# Patient Record
Sex: Male | Born: 1960 | Race: Black or African American | Hispanic: No | Marital: Married | Smoking: Never smoker
Health system: Southern US, Community
[De-identification: ages and names within clinical notes are randomized; demographics above are authoritative.]

## PROBLEM LIST (undated history)

## (undated) DIAGNOSIS — I10 Essential (primary) hypertension: Secondary | ICD-10-CM

## (undated) DIAGNOSIS — I8290 Acute embolism and thrombosis of unspecified vein: Secondary | ICD-10-CM

## (undated) DIAGNOSIS — F329 Major depressive disorder, single episode, unspecified: Secondary | ICD-10-CM

## (undated) DIAGNOSIS — R161 Splenomegaly, not elsewhere classified: Secondary | ICD-10-CM

## (undated) DIAGNOSIS — I428 Other cardiomyopathies: Secondary | ICD-10-CM

## (undated) DIAGNOSIS — C801 Malignant (primary) neoplasm, unspecified: Secondary | ICD-10-CM

## (undated) DIAGNOSIS — I472 Ventricular tachycardia, unspecified: Secondary | ICD-10-CM

## (undated) DIAGNOSIS — I959 Hypotension, unspecified: Secondary | ICD-10-CM

## (undated) DIAGNOSIS — K561 Intussusception: Secondary | ICD-10-CM

## (undated) DIAGNOSIS — I4729 Other ventricular tachycardia: Secondary | ICD-10-CM

## (undated) DIAGNOSIS — I6350 Cerebral infarction due to unspecified occlusion or stenosis of unspecified cerebral artery: Secondary | ICD-10-CM

## (undated) DIAGNOSIS — I513 Intracardiac thrombosis, not elsewhere classified: Secondary | ICD-10-CM

## (undated) DIAGNOSIS — Z9114 Patient's other noncompliance with medication regimen: Secondary | ICD-10-CM

## (undated) DIAGNOSIS — N289 Disorder of kidney and ureter, unspecified: Secondary | ICD-10-CM

## (undated) DIAGNOSIS — I509 Heart failure, unspecified: Secondary | ICD-10-CM

## (undated) DIAGNOSIS — E785 Hyperlipidemia, unspecified: Secondary | ICD-10-CM

## (undated) DIAGNOSIS — Z95810 Presence of automatic (implantable) cardiac defibrillator: Secondary | ICD-10-CM

## (undated) DIAGNOSIS — Z91148 Patient's other noncompliance with medication regimen for other reason: Secondary | ICD-10-CM

## (undated) DIAGNOSIS — F32A Depression, unspecified: Secondary | ICD-10-CM

## (undated) DIAGNOSIS — K56609 Unspecified intestinal obstruction, unspecified as to partial versus complete obstruction: Secondary | ICD-10-CM

## (undated) DIAGNOSIS — Z950 Presence of cardiac pacemaker: Secondary | ICD-10-CM

## (undated) HISTORY — PX: VASECTOMY: SHX75

## (undated) HISTORY — DX: Ventricular tachycardia: I47.2

## (undated) HISTORY — DX: Essential (primary) hypertension: I10

## (undated) HISTORY — DX: Other ventricular tachycardia: I47.29

## (undated) HISTORY — PX: PACEMAKER INSERTION: SHX728

## (undated) HISTORY — PX: BOWEL RESECTION: SHX1257

## (undated) HISTORY — DX: Cerebral infarction due to unspecified occlusion or stenosis of unspecified cerebral artery: I63.50

## (undated) HISTORY — DX: Ventricular tachycardia, unspecified: I47.20

## (undated) HISTORY — PX: CARDIAC DEFIBRILLATOR PLACEMENT: SHX171

## (undated) HISTORY — PX: INSERT / REPLACE / REMOVE PACEMAKER: SUR710

## (undated) HISTORY — DX: Hyperlipidemia, unspecified: E78.5

---

## 1999-07-12 DIAGNOSIS — K56609 Unspecified intestinal obstruction, unspecified as to partial versus complete obstruction: Secondary | ICD-10-CM

## 1999-07-12 HISTORY — DX: Unspecified intestinal obstruction, unspecified as to partial versus complete obstruction: K56.609

## 2006-07-11 DIAGNOSIS — K561 Intussusception: Secondary | ICD-10-CM

## 2006-07-11 HISTORY — DX: Intussusception: K56.1

## 2007-03-08 ENCOUNTER — Inpatient Hospital Stay (HOSPITAL_COMMUNITY): Admission: EM | Admit: 2007-03-08 | Discharge: 2007-03-10 | Payer: Self-pay | Admitting: Emergency Medicine

## 2007-03-12 ENCOUNTER — Emergency Department (HOSPITAL_COMMUNITY): Admission: EM | Admit: 2007-03-12 | Discharge: 2007-03-12 | Payer: Self-pay | Admitting: Emergency Medicine

## 2007-03-26 ENCOUNTER — Encounter: Admission: RE | Admit: 2007-03-26 | Discharge: 2007-03-26 | Payer: Self-pay | Admitting: Internal Medicine

## 2007-04-12 ENCOUNTER — Encounter: Admission: RE | Admit: 2007-04-12 | Discharge: 2007-04-12 | Payer: Self-pay | Admitting: Gastroenterology

## 2007-04-12 ENCOUNTER — Inpatient Hospital Stay (HOSPITAL_COMMUNITY): Admission: EM | Admit: 2007-04-12 | Discharge: 2007-04-17 | Payer: Self-pay | Admitting: *Deleted

## 2007-04-17 ENCOUNTER — Ambulatory Visit: Payer: Self-pay | Admitting: Critical Care Medicine

## 2007-04-17 ENCOUNTER — Inpatient Hospital Stay (HOSPITAL_COMMUNITY): Admission: EM | Admit: 2007-04-17 | Discharge: 2007-04-24 | Payer: Self-pay | Admitting: Emergency Medicine

## 2007-04-17 ENCOUNTER — Encounter: Payer: Self-pay | Admitting: Emergency Medicine

## 2007-04-18 ENCOUNTER — Encounter (INDEPENDENT_AMBULATORY_CARE_PROVIDER_SITE_OTHER): Payer: Self-pay | Admitting: Pediatrics

## 2007-04-23 ENCOUNTER — Ambulatory Visit: Payer: Self-pay | Admitting: Physical Medicine & Rehabilitation

## 2007-04-24 ENCOUNTER — Ambulatory Visit: Payer: Self-pay | Admitting: Physical Medicine & Rehabilitation

## 2007-04-24 ENCOUNTER — Inpatient Hospital Stay (HOSPITAL_COMMUNITY)
Admission: RE | Admit: 2007-04-24 | Discharge: 2007-05-15 | Payer: Self-pay | Admitting: Physical Medicine & Rehabilitation

## 2007-06-14 ENCOUNTER — Ambulatory Visit: Payer: Self-pay | Admitting: Physical Medicine & Rehabilitation

## 2007-06-14 ENCOUNTER — Encounter
Admission: RE | Admit: 2007-06-14 | Discharge: 2007-06-15 | Payer: Self-pay | Admitting: Physical Medicine & Rehabilitation

## 2007-06-25 ENCOUNTER — Encounter
Admission: RE | Admit: 2007-06-25 | Discharge: 2007-07-11 | Payer: Self-pay | Admitting: Physical Medicine & Rehabilitation

## 2007-07-12 DIAGNOSIS — I513 Intracardiac thrombosis, not elsewhere classified: Secondary | ICD-10-CM

## 2007-07-12 DIAGNOSIS — R161 Splenomegaly, not elsewhere classified: Secondary | ICD-10-CM

## 2007-07-12 HISTORY — DX: Splenomegaly, not elsewhere classified: R16.1

## 2007-07-12 HISTORY — DX: Intracardiac thrombosis, not elsewhere classified: I51.3

## 2007-07-16 ENCOUNTER — Encounter
Admission: RE | Admit: 2007-07-16 | Discharge: 2007-10-14 | Payer: Self-pay | Admitting: Physical Medicine & Rehabilitation

## 2007-08-15 ENCOUNTER — Encounter: Admission: RE | Admit: 2007-08-15 | Discharge: 2007-08-15 | Payer: Self-pay | Admitting: Internal Medicine

## 2008-02-25 ENCOUNTER — Inpatient Hospital Stay (HOSPITAL_COMMUNITY): Admission: EM | Admit: 2008-02-25 | Discharge: 2008-02-28 | Payer: Self-pay | Admitting: Emergency Medicine

## 2008-02-25 ENCOUNTER — Ambulatory Visit: Payer: Self-pay | Admitting: Internal Medicine

## 2008-02-26 ENCOUNTER — Encounter: Payer: Self-pay | Admitting: Internal Medicine

## 2008-03-07 ENCOUNTER — Ambulatory Visit: Payer: Self-pay | Admitting: Internal Medicine

## 2008-09-23 ENCOUNTER — Emergency Department (HOSPITAL_COMMUNITY): Admission: EM | Admit: 2008-09-23 | Discharge: 2008-09-23 | Payer: Self-pay | Admitting: Emergency Medicine

## 2008-10-21 DIAGNOSIS — I429 Cardiomyopathy, unspecified: Secondary | ICD-10-CM | POA: Insufficient documentation

## 2008-10-21 DIAGNOSIS — I6350 Cerebral infarction due to unspecified occlusion or stenosis of unspecified cerebral artery: Secondary | ICD-10-CM | POA: Insufficient documentation

## 2008-10-21 DIAGNOSIS — Z95810 Presence of automatic (implantable) cardiac defibrillator: Secondary | ICD-10-CM

## 2008-10-21 DIAGNOSIS — I10 Essential (primary) hypertension: Secondary | ICD-10-CM | POA: Insufficient documentation

## 2008-10-21 DIAGNOSIS — E785 Hyperlipidemia, unspecified: Secondary | ICD-10-CM

## 2008-10-23 ENCOUNTER — Ambulatory Visit: Payer: Self-pay | Admitting: Internal Medicine

## 2008-10-23 ENCOUNTER — Encounter: Payer: Self-pay | Admitting: Internal Medicine

## 2008-12-01 ENCOUNTER — Telehealth: Payer: Self-pay | Admitting: Internal Medicine

## 2008-12-24 ENCOUNTER — Emergency Department (HOSPITAL_COMMUNITY): Admission: EM | Admit: 2008-12-24 | Discharge: 2008-12-24 | Payer: Self-pay | Admitting: Emergency Medicine

## 2009-01-15 ENCOUNTER — Telehealth: Payer: Self-pay | Admitting: Internal Medicine

## 2009-01-20 ENCOUNTER — Ambulatory Visit: Payer: Self-pay | Admitting: Internal Medicine

## 2009-01-20 DIAGNOSIS — I472 Ventricular tachycardia: Secondary | ICD-10-CM

## 2009-02-02 ENCOUNTER — Ambulatory Visit: Payer: Self-pay | Admitting: Internal Medicine

## 2009-02-06 ENCOUNTER — Telehealth: Payer: Self-pay | Admitting: Internal Medicine

## 2009-02-06 LAB — CONVERTED CEMR LAB
Basophils Absolute: 0 10*3/uL (ref 0.0–0.1)
Basophils Relative: 0.7 % (ref 0.0–3.0)
Chloride: 100 meq/L (ref 96–112)
GFR calc non Af Amer: 92.68 mL/min (ref >60)
INR: 3.7 — ABNORMAL HIGH (ref 0.8–1.0)
Lymphs Abs: 1.9 10*3/uL (ref 0.7–4.0)
Monocytes Relative: 6.3 % (ref 3.0–12.0)
Potassium: 4 meq/L (ref 3.5–5.1)
Prothrombin Time: 37.2 s — ABNORMAL HIGH (ref 10.9–13.3)
RBC: 3.77 M/uL — ABNORMAL LOW (ref 4.22–5.81)
WBC: 5.4 10*3/uL (ref 4.5–10.5)
aPTT: 50.9 s — ABNORMAL HIGH (ref 21.7–28.8)

## 2009-02-09 ENCOUNTER — Telehealth: Payer: Self-pay | Admitting: Internal Medicine

## 2009-02-09 ENCOUNTER — Ambulatory Visit: Payer: Self-pay | Admitting: Internal Medicine

## 2009-02-09 ENCOUNTER — Ambulatory Visit (HOSPITAL_COMMUNITY): Admission: RE | Admit: 2009-02-09 | Discharge: 2009-02-09 | Payer: Self-pay | Admitting: Internal Medicine

## 2009-02-10 ENCOUNTER — Encounter: Payer: Self-pay | Admitting: Internal Medicine

## 2009-02-26 ENCOUNTER — Ambulatory Visit: Payer: Self-pay

## 2009-02-26 ENCOUNTER — Encounter: Payer: Self-pay | Admitting: Internal Medicine

## 2010-05-10 ENCOUNTER — Ambulatory Visit: Payer: Self-pay | Admitting: Internal Medicine

## 2010-07-23 ENCOUNTER — Emergency Department (HOSPITAL_COMMUNITY)
Admission: EM | Admit: 2010-07-23 | Discharge: 2010-07-24 | Payer: Self-pay | Source: Home / Self Care | Admitting: Emergency Medicine

## 2010-07-26 LAB — CBC
HCT: 35.1 % — ABNORMAL LOW (ref 39.0–52.0)
Hemoglobin: 11.9 g/dL — ABNORMAL LOW (ref 13.0–17.0)
MCH: 29.8 pg (ref 26.0–34.0)
MCHC: 33.9 g/dL (ref 30.0–36.0)
MCV: 88 fL (ref 78.0–100.0)
Platelets: 245 10*3/uL (ref 150–400)
RBC: 3.99 MIL/uL — ABNORMAL LOW (ref 4.22–5.81)
RDW: 12.6 % (ref 11.5–15.5)
WBC: 5 10*3/uL (ref 4.0–10.5)

## 2010-07-26 LAB — POCT I-STAT, CHEM 8
BUN: 14 mg/dL (ref 6–23)
Calcium, Ion: 1.19 mmol/L (ref 1.12–1.32)
Chloride: 102 mEq/L (ref 96–112)
Creatinine, Ser: 1 mg/dL (ref 0.4–1.5)
Glucose, Bld: 102 mg/dL — ABNORMAL HIGH (ref 70–99)
HCT: 39 % (ref 39.0–52.0)
Hemoglobin: 13.3 g/dL (ref 13.0–17.0)
Potassium: 2.9 mEq/L — ABNORMAL LOW (ref 3.5–5.1)
Sodium: 141 mEq/L (ref 135–145)
TCO2: 29 mmol/L (ref 0–100)

## 2010-07-26 LAB — GLUCOSE, CAPILLARY
Glucose-Capillary: 153 mg/dL — ABNORMAL HIGH (ref 70–99)
Glucose-Capillary: 194 mg/dL — ABNORMAL HIGH (ref 70–99)
Glucose-Capillary: 198 mg/dL — ABNORMAL HIGH (ref 70–99)
Glucose-Capillary: 95 mg/dL (ref 70–99)

## 2010-07-26 LAB — DIFFERENTIAL
Basophils Absolute: 0.1 10*3/uL (ref 0.0–0.1)
Basophils Relative: 1 % (ref 0–1)
Eosinophils Absolute: 0.4 10*3/uL (ref 0.0–0.7)
Eosinophils Relative: 8 % — ABNORMAL HIGH (ref 0–5)
Lymphocytes Relative: 39 % (ref 12–46)
Lymphs Abs: 2 10*3/uL (ref 0.7–4.0)
Monocytes Absolute: 0.6 10*3/uL (ref 0.1–1.0)
Monocytes Relative: 11 % (ref 3–12)
Neutro Abs: 2 10*3/uL (ref 1.7–7.7)
Neutrophils Relative %: 40 % — ABNORMAL LOW (ref 43–77)

## 2010-07-26 LAB — POCT CARDIAC MARKERS
CKMB, poc: <1 ng/mL — ABNORMAL LOW (ref 1.0–8.0)
Myoglobin, poc: 69.3 ng/mL (ref 12–200)
Troponin i, poc: <0.05 ng/mL (ref 0.00–0.09)

## 2010-07-26 LAB — DIGOXIN LEVEL: Digoxin Level: <0.2 ng/mL — ABNORMAL LOW (ref 0.8–2.0)

## 2010-08-01 ENCOUNTER — Encounter: Payer: Self-pay | Admitting: Physical Medicine & Rehabilitation

## 2010-08-10 NOTE — Assessment & Plan Note (Signed)
Summary: ROV-MJ   Visit Type:  Follow-up Primary Provider:  Rubbie Tran   History of Present Illness: Andre Tran returns today for followup.  He is a 50 yo man with a h/o VF arrest, s/p ICD implant who returns today for ICD followup.  He denies c/p or sob.  He has had no recent ICD therapies.  His records suggest he sustained a stroke 2 years ago. He still has some trouble with word finding but otherwise has had no complaints. No ICD shocks.  Current Medications (verified): 1)  Aspirin 81 Mg Tbec (Aspirin) .... Take One Tablet By Mouth Daily 2)  Digoxin 0.125 Mg Tabs (Digoxin) .... One By Mouth Daily 3)  Furosemide 40 Mg Tabs (Furosemide) .... Take One Tablet By Mouth Daily. 4)  Lantus For Opticlik 100 Unit/ml Soln (Insulin Glargine) .... As Directed 5)  Maox 420 Mg Tabs (Magnesium Oxide) .Marland Kitchen.. 1 By Mouth Once Daily 6)  Gnp Omeprazole 20 Mg Tbec (Omeprazole) .Marland Kitchen.. 1 By Mouth Once Daily 7)  Pravastatin Sodium 40 Mg Tabs (Pravastatin Sodium) .... Take One Tablet By Mouth Daily At Bedtime 8)  Sotalol Hcl 80 Mg Tabs (Sotalol Hcl) .... 2 By Mouth Two Times A Day 9)  Spironolactone 25 Mg Tabs (Spironolactone) .... Take One Tablet By Mouth Daily 10)  Warfarin Sodium 5 Mg Tabs (Warfarin Sodium) .... Use As Directed By Anticoagulation Clinic  Allergies (verified): No Known Drug Allergies  Past History:  Past Medical History: Last updated: 10/21/2008 HYPERTENSION, UNSPECIFIED (ICD-401.9) HYPERLIPIDEMIA-MIXED (ICD-272.4) CVA (ICD-434.91) CARDIOMYOPATHY, SECONDARY (ICD-425.9) ICD - IN SITU (ICD-V45.02)    Past Surgical History: Last updated: 05/28/2009 [employer] Jude Fortify DR single chamber cardioverter defibrillator.02/09/2009.Doylene Canning. Ladona Ridgel, MD ICD - 2005 Guidant  Review of Systems  The patient denies chest pain, syncope, dyspnea on exertion, and peripheral edema.    Vital Signs:  Patient profile:   50 year old male Height:      67 inches Weight:      175 pounds BMI:      27.51 Pulse rate:   72 / minute BP sitting:   110 / 72  (right arm)  Vitals Entered By: Laurance Flatten CMA (May 10, 2010 12:04 PM)  Physical Exam  General:  Well developed, well nourished, in no acute distress. Head:  normocephalic and atraumatic Eyes:  PERRLA/EOM intact; conjunctiva and lids normal. Mouth:  Teeth, gums and palate normal. Oral mucosa normal. Neck:  Neck supple, no JVD. No masses, thyromegaly or abnormal cervical nodes. Chest Wall:  Well healed ICD incision.  Gynecomastia is present bilaterally. Lungs:  Clear bilaterally.  No wheezes, rales, or rhonchi.   Heart:  RRR with normal S1 and S2.  PMI is not enlarged or laterally displaced.  No murmurs. Abdomen:  Bowel sounds positive; abdomen soft and non-tender without masses, organomegaly, or hernias noted. No hepatosplenomegaly. Msk:  Back normal, normal gait. Muscle strength and tone normal. Pulses:  pulses normal in all 4 extremities Extremities:  No clubbing or cyanosis. Neurologic:  His speech is appropriate but slow.  He is oriented to person, place, and date.    ICD Specifications Following MD:  Lewayne Bunting, MD     ICD Vendor:  [facility] Jude     ICD Model Number:  [id number]     ICD Serial Number:  [id number] ICD DOI:  02/09/2009     ICD Implanting MD:  Lewayne Bunting, MD  Lead 1:    Location: RA     DOI: 08/27/2003  Model #: [id number]     Serial #: [id number]     Status: active Lead 2:    Location: RV     DOI: 08/27/2003     Model #: [id number]     Serial #: [id number]     Status: active  Indications::  MONOMORPHIC VT  Explantation Comments: 02/09/2009 [employer] [id number] explanted  ICD Follow Up Remote Check?  No Charge Time:  9.3 seconds     Battery Est. Longevity:  7.4 years Underlying rhythm:  SR ICD Dependent:  No       ICD Device Measurements Atrium:  Amplitude: 3.0 mV, Impedance: 390 ohms, Threshold: 1.0 V at 0.4 msec Right Ventricle:  Amplitude: 8.8 mV, Impedance: 330 ohms, Threshold: 1.25 V at 0.4 msec Shock  Impedance: 44 ohms   Episodes MS Episodes:  1     Percent Mode Switch:  <1%     Coumadin:  Yes Shock:  0     ATP:  0     Nonsustained:  0     Atrial Pacing:  <1%     Ventricular Pacing:  <1%  Brady Parameters Mode DDD     Lower Rate Limit:  50     Upper Rate Limit 130 PAV 250     Sensed AV Delay:  250  Tachy Zones VF:  214     VT:  193     Next Cardiology Appt Due:  07/11/2010 Tech Comments:  Lead impedance alert values reprogrammed.  Device function normal.  ROV 3 months clinic. Altha Harm, LPN  May 10, 2010 12:26 PM  MD Comments:  Agree with above.  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  Will recheck in several months.  He will continue his meds as below. The following medications were removed from the medication list:    Lisinopril 10 Mg Tabs (Lisinopril) .Marland Kitchen... Take half  tablet by mouth daily His updated medication list for this problem includes:    Aspirin 81 Mg Tbec (Aspirin) .Marland Kitchen... Take one tablet by mouth daily    Sotalol Hcl 80 Mg Tabs (Sotalol hcl) .Marland Kitchen... 2 by mouth two times a day    Warfarin Sodium 5 Mg Tabs (Warfarin sodium) ..... Use as directed by anticoagulation clinic  Problem # 2:  HYPERTENSION, UNSPECIFIED (ICD-401.9) His pressure is well controlled today.  I have asked him to maintain a low sodium diet. The following medications were removed from the medication list:    Lisinopril 10 Mg Tabs (Lisinopril) .Marland Kitchen... Take half  tablet by mouth daily His updated medication list for this problem includes:    Aspirin 81 Mg Tbec (Aspirin) .Marland Kitchen... Take one tablet by mouth daily    Furosemide 40 Mg Tabs (Furosemide) .Marland Kitchen... Take one tablet by mouth daily.    Sotalol Hcl 80 Mg Tabs (Sotalol hcl) .Marland Kitchen... 2 by mouth two times a day    Spironolactone 25 Mg Tabs (Spironolactone) .Marland Kitchen... Take one tablet by mouth daily  Problem # 3:  ICD - IN SITU (ICD-V45.02) His device is working normally.   Will recheck in several months.  Patient Instructions: 1)  Your physician recommends that you schedule a follow-up appointment in: 3 months with Dr Ladona Ridgel 2)  Your physician recommends that you continue on your current medications as directed. Please refer to the Current Medication list given to you today.

## 2010-08-10 NOTE — Cardiovascular Report (Signed)
Summary: Office Visit   Office Visit   Imported By: Roderic Ovens 05/14/2010 09:37:23  _____________________________________________________________________  External Attachment:    Type:   Image     Comment:   External Document

## 2010-08-16 ENCOUNTER — Encounter: Payer: Self-pay | Admitting: Internal Medicine

## 2010-08-16 ENCOUNTER — Encounter (INDEPENDENT_AMBULATORY_CARE_PROVIDER_SITE_OTHER): Payer: Medicare Other

## 2010-08-16 DIAGNOSIS — I472 Ventricular tachycardia: Secondary | ICD-10-CM

## 2010-08-26 NOTE — Cardiovascular Report (Signed)
Summary: Office Visit   Office Visit   Imported By: Roderic Ovens 08/19/2010 16:12:33  _____________________________________________________________________  External Attachment:    Type:   Image     Comment:   External Document

## 2010-09-01 NOTE — Assessment & Plan Note (Signed)
Summary: 3 [email or web address]   Current Medications (verified): 1)  Aspirin 81 Mg Tbec (Aspirin) .... Take One Tablet By Mouth Daily 2)  Digoxin 0.125 Mg Tabs (Digoxin) .... One By Mouth Daily 3)  Furosemide 40 Mg Tabs (Furosemide) .... Take One Tablet By Mouth Daily. 4)  Lantus For Opticlik 100 Unit/ml Soln (Insulin Glargine) .... As Directed 5)  Maox 420 Mg Tabs (Magnesium Oxide) .Marland Kitchen.. 1 By Mouth Once Daily 6)  Gnp Omeprazole 20 Mg Tbec (Omeprazole) .Marland Kitchen.. 1 By Mouth Once Daily 7)  Pravastatin Sodium 40 Mg Tabs (Pravastatin Sodium) .... Take One Tablet By Mouth Daily At Bedtime 8)  Sotalol Hcl 80 Mg Tabs (Sotalol Hcl) .... 2 By Mouth Two Times A Day 9)  Spironolactone 25 Mg Tabs (Spironolactone) .... Take One Tablet By Mouth Daily 10)  Warfarin Sodium 5 Mg Tabs (Warfarin Sodium) .... Use As Directed By Anticoagulation Clinic  Allergies (verified): No Known Drug Allergies    ICD Specifications Following MD:  Lewayne Bunting, MD     ICD Vendor:  [employer]     ICD Model Number:  [phone]     ICD Serial Number:  [id number] ICD DOI:  02/09/2009     ICD Implanting MD:  Lewayne Bunting, MD  Lead 1:    Location: RA     DOI: 08/27/2003     Model #: 1914     Serial #: [id number]     Status: active Lead 2:    Location: RV     DOI: 08/27/2003     Model #: 0185     Serial #: [id number]     Status: active  Indications::  MONOMORPHIC VT  Explantation Comments: 02/09/2009 [employer] [id number] explanted  ICD Follow Up Battery Voltage:  85% V     Charge Time:  9.5 seconds     Battery Est. Longevity:  7.3-8.3 yrs Underlying rhythm:  SR ICD Dependent:  No       ICD Device Measurements Atrium:  Amplitude: 4.1 mV, Impedance: 430 ohms, Threshold: 1.0 V at 0.4 msec Right Ventricle:  Amplitude: 10.8 mV, Impedance: 360 ohms, Threshold: 1.0 V at 0.4 msec Shock Impedance: 47 ohms   Episodes MS Episodes:  1     Percent Mode Switch:  <1%     Coumadin:  Yes Shock:  0     ATP:  0     Nonsustained:  0     Atrial  Therapies:  0 Atrial Pacing:  <1%     Ventricular Pacing:  <1%  Brady Parameters Mode DDD     Lower Rate Limit:  50     Upper Rate Limit 125 PAV 250     Sensed AV Delay:  250  Tachy Zones VF:  214     VT:  193     Next Cardiology Appt Due:  11/09/2010 Tech Comments:  NORMAL DEVICE FUNCTION.  1AMS EPISODE LASTING 6 SECONDS.  NO CHANGES MADE. ROV IN 3 MTHS W/DEVICE CLINIC. Vella Kohler  August 16, 2010 4:59 PM

## 2010-10-16 LAB — PROTIME-INR
INR: 2.3 — ABNORMAL HIGH (ref 0.00–1.49)
Prothrombin Time: 26.7 seconds — ABNORMAL HIGH (ref 11.6–15.2)

## 2010-10-18 LAB — PROTIME-INR
INR: 2 — ABNORMAL HIGH (ref 0.00–1.49)
Prothrombin Time: 23.6 seconds — ABNORMAL HIGH (ref 11.6–15.2)

## 2010-10-18 LAB — CBC
Hemoglobin: 11.5 g/dL — ABNORMAL LOW (ref 13.0–17.0)
WBC: 4.6 10*3/uL (ref 4.0–10.5)

## 2010-10-18 LAB — DIFFERENTIAL
Basophils Absolute: 0 10*3/uL (ref 0.0–0.1)
Basophils Relative: 0 % (ref 0–1)
Eosinophils Absolute: 0.2 10*3/uL (ref 0.0–0.7)
Eosinophils Relative: 5 % (ref 0–5)
Neutro Abs: 2.5 10*3/uL (ref 1.7–7.7)

## 2010-10-18 LAB — POCT I-STAT, CHEM 8
BUN: 19 mg/dL (ref 6–23)
Calcium, Ion: 1.12 mmol/L (ref 1.12–1.32)
Sodium: 137 mEq/L (ref 135–145)

## 2010-10-21 LAB — POCT I-STAT, CHEM 8
BUN: 33 mg/dL — ABNORMAL HIGH (ref 6–23)
Calcium, Ion: 1.1 mmol/L — ABNORMAL LOW (ref 1.12–1.32)
Creatinine, Ser: 1.1 mg/dL (ref 0.4–1.5)
TCO2: 30 mmol/L (ref 0–100)

## 2010-10-21 LAB — PROTIME-INR
INR: 2 — ABNORMAL HIGH (ref 0.00–1.49)
Prothrombin Time: 23.7 seconds — ABNORMAL HIGH (ref 11.6–15.2)

## 2010-10-21 LAB — CBC
HCT: 39.3 % (ref 39.0–52.0)
Hemoglobin: 13.5 g/dL (ref 13.0–17.0)
MCV: 96.4 fL (ref 78.0–100.0)
RBC: 4.08 MIL/uL — ABNORMAL LOW (ref 4.22–5.81)
WBC: 5.4 10*3/uL (ref 4.0–10.5)

## 2010-11-23 NOTE — Discharge Summary (Signed)
Andre Tran, Andre Tran                  ACCOUNT NO.:  [id number]   MEDICAL RECORD NO.:  [id number]          PATIENT TYPE:  INP   LOCATION:  1420                         FACILITY:  [facility]   PHYSICIAN:  Beckey Rutter, MD  DATE OF BIRTH:  October 10, 1960   DATE OF ADMISSION:  04/12/2007  DATE OF DISCHARGE:  04/17/2007                               DISCHARGE SUMMARY   CHIEF COMPLAINT ON PRESENTATION:  Abdominal pain.   HISTORY OF PRESENT ILLNESS:  Please refer to the H&P dictated on the day  of admission by Dr. Elliot Cousin.   CONSULTATIONS:  1. Anselm Pancoast. Zachery Dakins, M.D., general surgery.  2. Jordan Hawks. Elnoria Howard, MD, gastroenterology.   HOSPITAL COURSE:  #1.  PICTURE OF INTUSSUSCEPTION:  During hospitalization, the patient  was seen by gastroenterology, Dr. Jeani Hawking, and Dr. Zachery Dakins with  general surgery for evaluation of picture of intussusception.  The  patient did not need surgical intervention, and you can refer to the  details of Dr. Annette Stable consultation as well as recommendations on  his consultation report.  The patient had improved, and now he is well  tolerating a regular diet. He will be discharged to follow up with his  primary and with general surgery, Dr. Zachery Dakins, as recommended.   #2.  NONISCHEMIC CARDIOMYOPATHY:  The patient had a 2-D echocardiogram,  but he has improved now, and I do not feel the echocardiographic results  will change the management, and we will continue the preadmission  medications for now for his cardiomyopathy.   #3.  ELEVATED CREATININE WITH SUSPICION OF ACUTE INSULT TO THE KIDNEYS:  This does not seem to be the case now.  His creatinine is 1.9, which is  better compared to 2 days ago.  On April 15, 2007, it was 1.25.   #4.  ABDOMINAL PAIN:  This is likely secondary to introsusception.  Please review the CAT scan summary documentation.   SECONDARY DIAGNOSES:  1. Nonischemic cardiomyopathy.  2. Non-Hodgkin's lymphoma and Hodgkin's  lymphoma diagnosed in 1992 and      2001.  The patient is status post chemotherapy and dissection of      various lymph nodes.  3. Type 2 diabetes.  4. History of a small-bowel obstruction in 2001 status post resection.  5. Status post vasectomy in 38s.  6. Status post biventricular implantable cardioverter-defibrillator in      2002.  The last ejection fraction was 45% in 2006.   DISCHARGE MEDICATIONS:  1. Aldactone 25 mg daily.  2. Lasix 40 mg daily in the morning and 20 mg in the afternoon.  3. Zocor 80 mg nightly.  4. Coreg 25 mg divided to 1/2 tablet in the morning and 1/2 tablet at      night.  5. Lantus 56 units b.i.d.  6. Zegerid 40 mg/1100 mg daily.   DISCHARGE PLAN:  The patient is discharged to follow up with his primary  physician and maybe general surgery as discussed with them.  The patient  is aware and agreeable to the discharge plan.      Veneta Penton Elnour,  MD  Electronically Signed     EME/MEDQ  D:  04/17/2007  T:  04/17/2007  Job:  [id number]

## 2010-11-23 NOTE — Assessment & Plan Note (Signed)
Andre Tran is back regarding his basal ganglia stroke with hemorrhagic  conversion.  He was on rehab until November 4 and he now is at home with  his wife.  His wife states that he has been doing fairly well with  improving mobility, although he still has some problems with his  judgment and memory.  He needs supervision still for safety.  The  patient denies frank pain currently.  He is sleeping fairly well.  He is  still concerned about weakness in his hand and his foot.  He uses a  wheelchair sometimes at home.  He is completing home therapy who seems  to be recommending that he move out to the outpatient setting.  The  patient states that his breathing has been good.  He has had no further  issues with shortness of breath currently.  His appetite has been  excellent.  Wife denies any depression.   REVIEW OF SYSTEMS:  Notable for the above.  Full review is in the  written health and history section of the chart.   MEDICATIONS:  Aldactone.  Zocor.  Ritalin.  Coreg.  Lexapro.  Zegerid.  Lasix.  Lantus.   SOCIAL HISTORY:  The patient is married.  His wife is with him today.   PHYSICAL EXAM:  Blood pressure 90/50, pulse 108, respiratory rate 20.  He is satting 95% on room air.  The patient is pleasant, alert and oriented x3 with extra time.  The patient did have problems following conversation and needed cueing,  and said he had some difficulties remembering some of the things we  discussed.  He walked with a 4-pronged cane for me today and was a bit  impulsive and quick with his movements.  He was at risk for falling.  He  does tend to swing the right leg out rather quickly when he walks  overall.  Other than that, he was safe with his gait.  Reflexes are 2+.  Sensation remains decreased in the right arm and leg at 1/2.  Strength  had improved quite a bit with strength in the hand and wrist at 1/5 to  2/5.  Biceps and triceps was 3+/5 to 4/5 and deltoid 2+/5.  Right lower  extremity  strength was 4/5 proximally to 3+/5 to 4/5 distally.  The  patient's judgment generally was poor, as well as insight at times.  He  did have much improvement in his receptive and expressive language and  was able to speak and carry conversation much more eloquently than he  had prior to our visit today.   ASSESSMENT:  1. Basal ganglia infarct with hemorrhagic conversion.  2. Cardiomyopathy.  3. Diabetes type 2.  4. History of situational depression.   PLAN:  1. Continue Lexapro at current dosing 5 mg at bedtime for now.  2. Will stop Ritalin and see how he does with his attention and focus.      He may need to go back on this, but was anxious to come off, so we      will give it a try.  3. Will convert [place] over to outpatient PT and OT to work on his      right upper extremity and lower extremity movement and      coordination.  As much as anything, his sensory loss is holding him      back right now.  He may      be a candidate for a Bionex trial, particularly in the  upper      extremity.  4. I will see him back in about 6 to 8 weeks' time.  In general, he      has done really nicely.      Ranelle Oyster, M.D.  Electronically Signed     ZTS/MedQ  D:  06/15/2007 13:12:17  T:  06/15/2007 15:48:09  Job #:  [id number]   cc:   Candyce Churn. Allyne Gee, M.D.  Fax: [phone]

## 2010-11-23 NOTE — H&P (Signed)
Andre Tran                  ACCOUNT NO.:  [id number]   MEDICAL RECORD NO.:  [id number]          PATIENT TYPE:  INP   LOCATION:  1420                         FACILITY:  [facility]   PHYSICIAN:  Andre Tran, M.D.DATE OF BIRTH:  1960/10/13   DATE OF ADMISSION:  04/12/2007  DATE OF DISCHARGE:                              HISTORY & PHYSICAL   CHIEF COMPLAINT:  Nausea, abdominal pain for 1 month.   HISTORY:  Andre Tran is a 50 year old black male with past history of  lymphoma treated with chemotherapy on two occasions by the [facility] in the [place] area.  He is a medically disabled [employer], being  discharged in, I think, 69.  The patient recently moved to the  [place] area and presented to the emergency department I think over  at [facility] approximately a month ago with shortness of breath and kind of  vague abdominal symptoms.  I am not sure who he was referred to, but  because of increasing pain, a cough, and  vomiting, he presented to the  emergency room on April 12, 2007.  The patient does have a history of  hypertension and is edematous and has had a cardiomyopathy following the  chemotherapy administration for his lymphoma.   PAST MEDICAL HISTORY:  His past medical history is significant in that  he said he had a bowel obstruction secondary to narcotic use back in the  early 1990s, and then he has had a couple of admissions of several days'  duration in the hospitals in [place] where he was treated  conservatively and did not require surgery.  On the CT that was  performed when he came to the emergency room yesterday, there does  appear to be a small intussusception in the proximal jejunum, but the  contrast goes through easily.  The patient was really having more  problems with shortness of breath and edema and etc. and not that of  actual severe abdominal pain or obstruction.  I reviewed the CTs and saw  the patient briefly.  He was being admitted by Dr.  Elliot Tran through  the medical service for treatment of his edema and bilateral pleural  effusions, etc., and I suggested that a repeat CT be performed today to  see whether this area was worsening or possibly resolving.  He did have  the repeat CT this morning with oral contrast.  We really do not see the  area that looked like an intussusception yesterday, and if there is  anything there, it is not visualized and it is certainly not causing  obstruction.  I think that it would be best to go ahead and advance him  to a full liquid diabetic diet and just follow him conservatively.  Unless he has an obstruction or obvious intussusception, I would not  plan on any type of laparotomy.  The patient informed me last night that  Dr. Anselmo Tran was planning to do an upper endoscopy and colon exam  on him.  I am not sure exactly why she was planning to that, for the  vague abdominal pain I expect, but I will leave it to her whether these  studies are needed.  We certainly do not see anything on the CT scans  either yesterday or today that looks like an lead point that would be  the etiology of the intussusception.  You can see where there is a  stapled anastomosis further down in the small bowel.  What type of  lesion was excised, the patient does not know, but he does not think  that it was an area of lymphoma in the actual intestine.  He will follow  along.  I ordered him to have a full liquid diet.  With the patient's  effusions, congestive heart failure, previous abdominal surgery, ad  cardiomyopathy, there is definitely a significant increased operative  risk, and unless it is absolutely necessary, I would be slow in  recommending a laparotomy for kind of vague findings without an obvious  obstruction or an obvious intussusception noted on the CT scans.           ______________________________  Andre Tran, M.D.    WJW/MEDQ  D:  04/13/2007  T:  04/15/2007  Job:   [id number]

## 2010-11-23 NOTE — Discharge Summary (Signed)
NAMEKIMOTHY, Tran NO.:  [id number]   MEDICAL RECORD NO.:  [id number]          PATIENT TYPE:  INP   LOCATION:  2923                         FACILITY:  [facility]   PHYSICIAN:  Eduardo Osier. Sharyn Lull, M.D. 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   FINAL DIAGNOSES:  Status post atypical chest pain, myocardial infarction  ruled out, status post hypotension secondary to medications, severe  nonischemic dilated cardiomyopathy secondary to chemotherapy for non-  Hodgkin's and Hodgkin's lymphoma,  status post Bi-V ICD  in the past,  compensated congestive heart failure, hypertension on discharge and  insulin-requiring diabetes mellitus.   DISCHARGE MEDICATIONS:  1. Coreg 25 mg 1 tablet every 12 hours  2. Lisinopril 10 mg 1 tablet twice daily  3. Aldactone 25 mg 1 tablet daily  4. Lasix 40 mg 1 tablet daily  5. Zocor 80 mg 1 tablet daily at night  6. Lantus insulin 56 units twice daily as before.   DIET:  Low salt, low cholesterol 1800 calories ADA diet.  The patient  has been advised to monitor blood sugar and blood pressure twice daily.  Follow-up with me in 1 week.   CONDITION AT DISCHARGE:  Stable.  Follow-up with PMD as scheduled.   BRIEF HISTORY AND HOSPITAL COURSE:  Andre Tran is a 50 year old black male  with past medical history significant for hypertension, insulin-  requiring diabetes mellitus, history of congestive heart failure,  nonischemic dilated cardiomyopathy secondary to chemotherapy for  non-  Hodgkin and Hodgkin lymphoma, status post Bi-V ICD  in [place].  He  came to the ER complaining of vague retrosternal chest pain associated  with right arm numbness and dizziness.  States chest pain increased with  deep breathing and movement.  Denies any nausea, vomiting, diaphoresis.  Denies PND, orthopnea, leg swelling.  The patient was noted to be  hypotensive with blood pressure of 66/40.  The patient received first IV  fluid challenge and developed some difficulty in breathing and then was  started on IV dopamine with improvement in blood pressure in 90s.  Patient states his blood pressure stays in 70s and 80s systolic.  Denies  any syncopal episode.  States had 2-D echo done recently which showed  severely depressed LV systolic function in [place].  Past medical  history as above.   PAST SURGICAL HISTORY:  Had Bi-V ICD in the past had laparotomy for  small-bowel obstruction in the past.   SOCIAL HISTORY:  Is married, has two children.  Moved recently from [place], [place], no history of alcohol or tobacco abuse.  Presently on  disability.  He is retired.   FAMILY HISTORY:  Is  noncontributory.   MEDICATION AT HOME:  1. Coreg 25 mg every 12 hours  2. Aldactone 25 mg 1 tablet daily  3. Lasix 40 mg p.o. daily  4. Zocor 80 mg p.o. daily.  5. Lisinopril 20 mg daily  6. Hydralazine 25 mg t.i.d.  7. Lantus insulin 56 units twice daily.   PHYSICAL EXAMINATION:  On examination he is alert and oriented x3 in no  acute distress.  Blood pressure after fluid challenge on dopamine was 75-  80 systolic palpable.  Heart rate in 70s.  Conjunctivae was pink.  NECK: Supple, no JVD.  LUNGS were clear to auscultation.  CARDIOVASCULAR:  S1, S2 was soft.  There was soft systolic murmur and S3  gallop.  ABDOMEN was soft.  Bowel sounds were present, nontender.  EXTREMITIES: There is no clubbing, cyanosis or edema.   LABS:  EKG showed normal sinus rhythm, left atrial enlargement,  poor R-  wave progression in V1-V4, ST-T wave changes in lateral leads.  Other  labs:  CPK MBs were less than 1.0.  Troponin I was less than 0.05.  His  total cholesterol 177, LDL 129, HDL 35, CK 60, MB 1.0, second set CK 40,  MB 0.8, third set CK 38, MB 0.8.  Troponin I was 0.04, 0.05, 0.04.  His  BNP was 555 after fluid challenge.  Hemoglobin A1c was 11.3.  Sodium was  136, potassium 3.5, chloride 102, bicarb 24, glucose 104, BUN 27,  creatinine 1.0, hemoglobin was 12.9, hematocrit 38.8, white count of  5.1.  Potassium this morning is 3.7, BUN 24, creatinine 0.9, hemoglobin  is 12.6, hematocrit 37, white count of 6.9, magnesium was 2.1   BRIEF HOSPITAL COURSE:  The patient was admitted to step-down unit.  MI  was ruled out by serial enzymes and EKG. The patient's medications were  reduced as per orders and the patient's dopamine was slowly weaned off.  The patient's blood pressures stayed in 90s to 100s systolic.  The  patient is asymptomatic.  The patient is walking in the room without any  problems.  The patient did not have any further episodes of chest pain  or dizziness during the hospital stay.  The patient had extensive  cardiac workup in the past including cath which showed normal coronary  arteries.  His nonischemic dilated cardiomyopathy was attributed to  chemotherapy for non-Hodgkin and Hodgkin lymphoma.  The patient will be  discharged home on above medications and will be followed up in my  office in 1 week.  The patient will be referred to the EP as outpatient  for follow-up of his ICD.      Eduardo Osier. Sharyn Lull, M.D.  Electronically Signed     MNH/MEDQ  D:  03/10/2007  T:  03/11/2007  Job:  [id number]

## 2010-11-23 NOTE — Consult Note (Signed)
Andre Tran, Andre Tran                  ACCOUNT NO.:  [id number]   MEDICAL RECORD NO.:  [id number]          PATIENT TYPE:  INP   LOCATION:  1420                         FACILITY:  [facility]   PHYSICIAN:  Jordan Hawks. Elnoria Howard, MD    DATE OF BIRTH:  1961/03/13   DATE OF CONSULTATION:  04/13/2007  DATE OF DISCHARGE:                                 CONSULTATION   REFERRING PHYSICIAN:  Robyn N. Allyne Gee, M.D.   HISTORY OF PRESENT ILLNESS:  This is a 50 year old gentleman with past  medical history of non-Hodgkin's lymphoma who has been in remission  since 2001, with bowel obstruction in the past with resultant partial  colectomy, diabetes and chemotherapy induced cardiomyopathy who is  admitted to hospital for findings of jejunal intussusception.  The  patient previously presented to Dr. Loreta Ave in an outpatient setting on the  September 23, with complaints of vomiting and abdominal pain.  At that  time, he stated that his symptoms started acutely 2 weeks prior to his  evaluation in the office.  He did have a history of reflux disease.  Subsequently, the patient was scheduled to have a CT scan of his abdomen  and this was revealing for a jejunal intussusception.  There is no  evidence of any lymphoma recurrence and he was recommended to be  admitted to the hospital for further evaluation and treatment.   PAST MEDICAL HISTORY/PAST SURGICAL HISTORY:  As stated above.   FAMILY HISTORY:  Noncontributory.   SOCIAL HISTORY:  The patient is married.  No alcohol, tobacco or illicit  drug use.   ALLERGIES:  No known drug allergies.   MEDICATIONS:  1. Lasix 20 mg IV q.12 h.  2. Aldactone 25 mg p.o. daily.  3. Coreg 4.5 mg p.o. b.i.d.  4. Claritin 10 mg p.o. nightly.  5. Tussionex 5 mL p.o. q.12 h.  6. Pepcid IV 20 mg daily.   PHYSICAL EXAMINATION:  VITAL SIGNS:  Blood pressure is 121/91, heart  rate is 104, respirations 20, temperature is 97.3.  GENERAL:  The patient is uncomfortable bending over a  bucket in obvious  discomfort.  HEENT:  Normocephalic, atraumatic.  Extraocular muscles intact.  NECK:  Supple.  No lymphadenopathy.  LUNGS:  Clear to auscultation bilaterally.  CARDIOVASCULAR:  Regular rate and rhythm.  ABDOMEN:  Soft.  There is tenderness on the left side.  No rebound or  rigidity.  EXTREMITIES:  No clubbing, cyanosis or edema.   LABORATORY DATA AND X-RAY FINDINGS:  White blood cell count of 7.5,  hemoglobin 14.3, MCV 93.8, platelets at 289.  Sodium 133, potassium 3.8,  chloride 97, CO2 28, glucose 228.  BUN 15, creatinine 0.9.  Total  bilirubin 1.5, Alk phos 128, AST 40, ALT is 31.  BNP is 526.   IMPRESSION:  1. Jejunal intussusception.  2. Congestive heart failure.   RECOMMENDATIONS:  After evaluation of the patient, it is unlikely that  any endoscopic approach will be able to benefit the patient.  He does  not appear to have any recurrence of his lymphoma at this time.  He  is  still uncomfortable and the current CT scan that was performed last  evening shows that the intussusception is not as prominence as the  original CT scan, however, the patient continues to remain symptomatic.  At this point, he will most likely require surgical intervention,  however, it will be up to the surgical service to make that  determination.  At this time, there is no active GI intervention that  can be prescribed currently.  Plan is to await for surgical  recommendations and interventions.      Jordan Hawks Elnoria Howard, MD  Electronically Signed     PDH/MEDQ  D:  04/13/2007  T:  04/14/2007  Job:  [id number]   cc:   Candyce Churn. Allyne Gee, M.D.  Fax: [phone]

## 2010-11-23 NOTE — H&P (Signed)
[name], [name] NO.:  [id number]   MEDICAL RECORD NO.:  [id number]          PATIENT TYPE:  IPS   LOCATION:  4030                         FACILITY:  [facility]   PHYSICIAN:  Ranelle Oyster, M.D.DATE OF BIRTH:  [birth year]   DATE OF ADMISSION:  04/24/2007  DATE OF DISCHARGE:                              HISTORY & PHYSICAL   CHIEF COMPLAINT:  Right-sided weakness.   HISTORY OF PRESENT ILLNESS:  This is a 50 year old African-American male  on disability with history of lymphoma and recent admission for jejunal  intussusception October 2 through 7.  He was admitted again on October 7  with right-sided weakness and difficulty speaking.  He had a CT done  which showed evidence of a left MCA stroke.  He underwent cerebral  angiogram and TPA with unsuccessful attempt at clot retrieval.  On  October 10, the patient had decreased mental status, and repeat CT was  positive for increased edema and large left MCA basal ganglia infarct  with hemorrhagic transformation and a 4 mm midline shift.  Swallow  evaluation was done and showed impaired bolus collection in transfer.  He was placed on dysphagia 1 honey-thick liquid diet.  Neurology  recommended resuming aspirin 2 weeks after stroke.  The patient  continues to have significant right hemiparesis, dysarthria, gaze  preference to left, and language issues.  The patient has had ongoing  dyspnea on exertion with any type of movement and tachycardia.  Apparently has history of chronic CHF and cardiomyopathy.  The patient  is requiring moderate assistance for basic mobility and transfers and  needs frequent rest breaks.  The patient was admitted to inpatient rehab  to improve upon the functional deficits and medical issues mentioned  above.   REVIEW OF SYSTEMS:  Positive for weakness, numbness, incontinence.  He  has had some constipation issues.  He does complain of some lower  abdominal pain.  He has been emptying his  bladder as of late.  Shortness  of breath as noted above.  Full review is in the written History and  Physical.   PAST MEDICAL HISTORY:  Positive for:  1. Nonischemic cardiomyopathy.  2. Jejunal intussusception.  3. Diabetes, type 2, controlled.  4. Hypertension.  5. Lipoma.  6. Dyslipidemia.  7. History of small-bowel resection x1 in 2001.  8. CHF.  9. History of ventricular tachycardia  AICD and PPM.   FAMILY HISTORY:  Negative for CAD, diabetes, cancer, stroke.   SOCIAL HISTORY:  The patient is married.  His wife is self employed, and  he is on medical disability from [employer].  He does not smoke or  drink.  He was independent prior to arrival.   ALLERGIES:  None.   HOME MEDICATIONS:  1. Aldactone 25 mg daily.  2. Lasix 40 mg daily.  3. Coreg 25 mg 1/2 b.i.d.  4. Lantus insulin 56 units b.i.d.  5. Zegerid 40/1100 mg daily.   LABORATORY DATA:  Hemoglobin 13.9, white count 4.7. BUN is 14,  creatinine 1.1, sodium 137, potassium 3.8.   PHYSICAL EXAMINATION:  VITAL SIGNS:  Blood pressure is 123/93, pulse 91,  temperature 97.5, T-max 101.1, respiratory rate 20.  Weight 83.6 kg.  GENERAL:  The patient has been lethargic and reclusive.  HEENT:  Pupils equal, round and reactive to light.  Nose and throat exam  is unremarkable with fair dentition.  The patient did have some thrush  on the tongue, however.  NECK:  Supple without JVD or lymphadenopathy.  CHEST:  Clear to auscultation bilaterally without any obvious wheezes or  rales.  HEART:  Regular rhythm but tachycardiac.  No murmur, rubs, or gallops  were appreciated.  EXTREMITIES:  Showed no clubbing, cyanosis or edema.  ABDOMEN:  Soft, nontender.  Bowel sounds are positive.  A well-healed  incision.  NEUROLOGICALLY:  Cranial nerves II-XII revealed a right central seventh  with right tongue deviation.  The patient had word-finding deficits, was  dysarthric.  Reflexes are 2++ on the right.  No clonus appreciated.  Toes  were down.  Motor function was trace at the shoulder and elbow,  absent at the hand.  He was trace to absent at the right ankle, at 1-2  at the knee and hip on the right.  Left side was 5/5.  The patient did  have difficulties with visual field and took extra time looking to the  periphery.  No frank double vision was appreciated nor field cuts.   ASSESSMENT/PLAN:  1. Functional deficits secondary to left basal ganglia infarct with      hemorrhagic conversion.  The patient presents with right      hemiparesis, dysphagia, dysarthria, right hemisensory deficits, and      cognitive and linguistic deficits.  The patient's recovery also      will be affected by his history of congestive heart failure and      dyspnea (either true are perceived).  The patient will begin      comprehensive inpatient rehab with PT to assess for range of      motion, strengthening, transfers and pre-gait training.  OT will      assess and treat for range of motion, strengthening, ADLs, and      cognitive and perceptual training with equipment as appropriate.      Speech language pathology will follow for language and speech      skills as well as cognition.  Also will assess for swallowing      needs.  Rehab nurse will follow on 24-hour basis for bowel,      bladder, skin, and medication issues.  Rehab case manager and      social worker will assess for psychosocial needs and discharge      planning.  Estimated length of stay is 3 weeks.  Goals:      Supervision to occasional minimum assistance.  Prognosis fair.  2. Deep vein thrombosis prophylaxis with subcutaneously Lovenox.  3. Cardiomyopathy:  Continue Lasix and Coreg.  Observe blood pressure,      weights, and clinical presentation.  The patient presents with some      anxiety over dyspnea which exacerbates symptoms.  4. Diabetes:  Continue Lantus insulin 30 units subcutaneously b.i.d.      with sliding scale insulin coverage.  5. Fluid, electrolytes, and  nutrition:  Continue nightly fluids and      monitor BMET upon admit.  6. Fever:  Will monitor the patient clinically for signs and symptoms      of infection.  Check admission labs.  7. Mood:  Follow the patient clinically  and provide ego supportive      therapy as appropriate.  Consider antidepressant if needed.      Ranelle Oyster, M.D.  Electronically Signed    ZTS/MEDQ  D:  04/24/2007  T:  04/25/2007  Job:  [id number]

## 2010-11-23 NOTE — H&P (Signed)
NAMEARISTIDIS, TALERICO NO.:  [id number]   MEDICAL RECORD NO.:  [id number]          PATIENT TYPE:  INP   LOCATION:  2025                         FACILITY:  [facility]   PHYSICIAN:  Pricilla Riffle, MD, FACCDATE OF BIRTH:  January 04, 1961   DATE OF ADMISSION:  02/25/2008  DATE OF DISCHARGE:                              HISTORY & PHYSICAL   IDENTIFICATION:  The patient is a 50 year old gentleman who presents  with defibrillator firing.   The patient was recently discharged from the [facility]  (admitted from Dec 07, 2007 to February 21, 2008, no records available for  this patient is unclear why).  He was home on Thursday, Friday was doing  okay, Saturday and "Sunday by report doing okay last night, however.  [facility] [place] and on coming back had  two episodes of his defibrillator firing.   His energy has been good since he has been home.  His appetite has been  good.  No shortness of breath.  No chest pain.   On interrogation by the [employer] representative, he has had two  defibrillation attempts last evening and two today that were  unsuccessful.  Note since Friday, he has had increased ventricular  ectopy with nonsustained VT and tachycardia pacing.  This occurred again  on the 15th, 16th, and again today.  They have been intermittent today's  episodes of shocking initiated with pacing and then shocks which  eventually episodes resolved.  He was back on his own.   Currently, the patient is symptom free.  No chest pain, no shortness of  breath.   ALLERGIES:  None.   MEDICATIONS:  1. Aspirin 81.  2. Coreg 6.25 b.i.d.  3. Keflex 250 b.i.d.  4. Digoxin 0.125 daily.  5. Colace 100.  6. Lasix 40.  7. Insulin 10 units glargine at lunch.  8. Lisinopril 5.  9. Omeprazole 40.  10.Paxil 20.  11.Pravachol 40.  12.Aldactone 25.  13.Coumadin 7.5 just increased today.   PAST MEDICAL HISTORY:  1. Nonischemic  cardiomyopathy, felt secondary to chemotherapy.  2. History of non-Hodgkin's lymphoma 1998, Hodgkin's lymphoma 2003-      20" 04.  3. Cardiomyopathy diagnosed in 2005 with heart failure.  4. Diabetes diagnosed 1998.  5. History of CVA October 2008.  6. Right basal ganglia infarct with hemorrhagic conversion (patient      with right hemiparesis, dysarthria).  7. History of a jejunal intussusception.  8. History of renal failure in past.  No records.  9. History of liver failure ? No records.  10.Bile obstruction.  No records.   SOCIAL HISTORY:  The patient lives in [place], recently discharged  from the [facility] in [place].  He is married times 17-18 years,  does not smoke, does not drink.   FAMILY HISTORY:  No history of premature CAD.   REVIEW OF SYSTEMS:  No fevers, chills since home, four-pillow orthopnea  is chronic, no PND, no cough, good appetite, no nausea, and vomiting.  Otherwise, all systems reviewed negative to the above problem except  as  noted above.   PHYSICAL EXAMINATION:  GENERAL:  The patient is in no acute distress.  VITAL SIGNS:  Blood pressure is 107/80, pulse is 91, and 97% on room  air.  The patient complains of no chest pain.  HEENT:  Normocephalic, atraumatic, EOMI, PERRL.  NECK:  JVP is normal.  No thyromegaly or bruits.  LUNGS:  Relatively clear.  CARDIAC:  Regular rate and rhythm.  S1-S2.  No S3.  No S4.  No  significant murmurs.  ABDOMEN:  Mild right upper quadrant tenderness.  Normal bowel sounds.  No masses.  EXTREMITIES:  2+ distal pulses throughout.  No lower extremity edema.  MUSCULOSKELETAL:  Moving all extremities.  NEURO:  The patient alert and oriented x3.  Cranial nerves II-XII are  grossly intact, moving all extremities.  Otherwise, deferred.  GU:  Healing abscess site left buttock, no erythema.   Chest x-ray pending, 12-lead EKG normal sinus rhythm, 87 beats per  minute, LVH with strain versus ischemia.   LABORATORY DATA:   Hemoglobin of 15.5, BUN and creatinine of 29 and 0.9,  potassium of 5.6, point of care markers, troponin 0.18.   IMPRESSION:  The patient is a 50 year old with nonischemic  cardiomyopathy now with increased ventricular ectopy times several days.  Antitachycardia pacing was used.  The patient's defibrillator went off  x4, does not appear to have been successful, resolved on own.   The [employer] representative has reprogrammed with change in  pacing parameters EP todiscontinue in a.m.   Questioned why sudden change with increased ectopy.  Volume may be  slightly increased but minimal.  We will get urine drug screen, check  digoxin level, hold digoxin for now, check BNP, and check TSH.   Need to get records from [facility] regarding  defibrillation, also [place] enhancement, [place] regarding recent  hospitalizations.   Cardiomyopathy, continue meds.  Hold digoxin until level back.   Diabetes, check hemoglobin A1c, chem sticks q.a.c., nightly.   Cerebrovascular accident with reported left ventricular thrombus, echo  continue.  Continue Coumadin.      Pricilla Riffle, MD, [facility]  Electronically Signed     PVR/MEDQ  D:  02/25/2008  T:  02/26/2008  Job:  [id number]

## 2010-11-23 NOTE — Op Note (Signed)
NAMETANNAR, BROKER NO.:  [id number]   MEDICAL RECORD NO.:  [id number]          PATIENT TYPE:  OIB   LOCATION:  2899                         FACILITY:  [facility]   PHYSICIAN:  Doylene Canning. Ladona Ridgel, MD    DATE OF BIRTH:  [birth year]   DATE OF PROCEDURE:  02/09/2009  DATE OF DISCHARGE:  02/09/2009                               OPERATIVE REPORT   PROCEDURE PERFORMED:  Removal of previously implanted dual-chamber  implantable cardioverter-defibrillator, which had reached elective  replacement indication followed by insertion of a new dual-chamber  implantable cardioverter-defibrillator with defibrillation threshold  testing.   INTRODUCTION:  The patient is a 50 year old male with a prior dilated  cardiomyopathy and a VF arrest, status post ICD who was now reached ERI  on his device and is here for ICD generator change.   PROCEDURE:  After informed consent was obtained, the patient was taken  to the diagnostic EP lab in the fasting state.  After usual preparation  and draping, intravenous fentanyl and midazolam were given for sedation.  A 30 mL lidocaine was infiltrated into the left infraclavicular region.  A 7-cm incision was carried out over this region.  Electrocautery was  utilized to dissect down to the fascial plane.  The ICD pocket was  opened without difficulty and the generator was removed with gentle  traction.  Electrocautery was applied to the leads very judiciously.  The leads were analyzed and both atrial and RV leads were found to be  working satisfactorily with P and R waves of 3 and 9 respectively.  Thresholds were 1 volt at 0.5 milliseconds or less both in the right  atrium and the right ventricle.  Impedances were stable.  With these  satisfactory parameters, the old [employer] dual-chamber ICD was  removed and the new [employer] Fortify DR dual-chamber ICD, serial number  [id number] was connected to the atrial and RV pacing leads and placed back  in  the subcutaneous pocket.  Interrogation device was satisfactory.  The  patient was prepped for DFT testing.   After the patient was more deeply sedated with fentanyl and Versed, VF  was induced with a T-wave shock.  A 17.5-joule shock was delivered  terminating VF and restoring sinus rhythm with a shocking impedance of  43 ohms.  At this point, the pocket was again irrigated with gentamicin  irrigation and incision closed with 2-0 Vicryl and 3-0 Vicryl.  Benzoin  and Steri-Strips were painted on the skin, the pressure dressing was  placed, and then the patient was returned to his room in satisfactory  condition.   COMPLICATIONS:  There were no immediate procedure complications.   RESULTS:  This demonstrate successful removal of a previously implanted  [employer] dual-chamber ICD followed by successful insertion of a  new [employer] dual-chamber ICD in a patient with a history of VF arrest  and nonischemic cardiomyopathy.      Doylene Canning. Ladona Ridgel, MD  Electronically Signed     Doylene Canning. Ladona Ridgel, MD  Electronically Signed    GWT/MEDQ  D:  02/09/2009  T:  02/09/2009  Job:  [id number]   cc:   Candyce Churn. Allyne Gee, M.D.

## 2010-11-23 NOTE — Discharge Summary (Signed)
NAMEKIRSTEN, Andre Tran NO.:  [id number]   MEDICAL RECORD NO.:  [id number]          PATIENT TYPE:  OIB   LOCATION:  2899                         FACILITY:  [facility]   PHYSICIAN:  Doylene Canning. Ladona Ridgel, MD    DATE OF BIRTH:  1960-11-13   DATE OF ADMISSION:  02/09/2009  DATE OF DISCHARGE:  02/09/2009                               DISCHARGE SUMMARY   This patient has no known drug allergies.   TIME FOR THIS DICTATION:  Less than 35 minutes.   FINAL DIAGNOSES:  1. Cardioverter defibrillator.  He is at elective replacement      indicator.  2. Explant of existing [employer] single chamber cardioverter      defibrillator with implant of the St. Jude Fortify DR single      chamber cardioverter defibrillator.  Defibrillator threshold study      less than or equal to 17.5 joules, Dr. Lewayne Bunting.   SECONDARY DIAGNOSES:  1. History of ventricular fibrillation arrest, status post implantable      cardioverter-defibrillator implant.  2. Stroke 2 years ago.  3. Hypertension.  4. Dyslipidemia.  5. Cardiomyopathy.  6. Ventricular tachycardia, stable over the past 2 years on sotalol.   BRIEF HISTORY:  Mr. Mahone is a 50 year old male.  He had history of  ventricular fibrillation arrest and this required ICD implant.  His ICD  is now beeping and has reached elective replacement indicator.  He  denies shortness of breath or chest pain.  He has had no recent ICD  therapies.  Apparently, he sustained a stroke 2 years ago.  The patient  has been stable on sotalol with no recurrence of VT, the patient will  present electively for generator change.   HOSPITAL COURSE:  The patient presents on February 09, 2009 electively.  He  underwent an explantation of his existing [employer] device,  which was at elective replacement indicator with implant of St. Jude  Fortify DR single chamber cardioverter defibrillator, Dr. Lewayne Bunting.  The patient discharging the same day.  He was  counseled to remove the  bulky bandage on the morning of Tuesday, February 10, 2009.  He is to keep  the incision open  to the air and absolutely dry until Monday, February 16, 2009.  He returns  to Dr. Lubertha Basque office [address], [address] [place] on Monday,  February 23, 2009 at 9 o'clock to check the incision.  Medications are  dictated on a separate home discharge medication listing.      Maple Mirza, PA      Doylene Canning. Ladona Ridgel, MD  Electronically Signed    GM/MEDQ  D:  02/09/2009  T:  02/10/2009  Job:  [id number]   cc:   Candyce Churn. Allyne Gee, M.D.

## 2010-11-23 NOTE — Discharge Summary (Signed)
[name], [name]:  [id number]   MEDICAL RECORD NO.:  [id number]          PATIENT TYPE:  INP   LOCATION:  2025                         FACILITY:  [facility]   PHYSICIAN:  Doylene Canning. Ladona Ridgel, MD    DATE OF BIRTH:  September 09, 1960   DATE OF ADMISSION:  02/25/2008  DATE OF DISCHARGE:  02/28/2008                               DISCHARGE SUMMARY   I will also send a copy to the patient's wife who was going to be  visiting the [place] with the patient on March 24, 2008.  She can take  the discharge summary with her then.   ALLERGIES:  NO KNOWN DRUG ALLERGIES.   FINAL DIAGNOSES:  1. Admitted with cardioverter defibrillator discharges.      a.     The patient has elevated defibrillator threshold.      b.     Device reprogrammed to provide longer ATP bursts, __________       the first shock VT zone has increased from 21 joules to 31 joules.      c.     The patient started on sotalol 160 mg p.o. q.12 this       admission.      d.     QTc not prolonged after 36 hours on sotalol.      e.     No ventricular tachycardia or nonsustained ventricular       tachycardia while the patient receiving sotalol this admission.  2. Ongoing recommendations from a hospitalization at the [facility],      [place], admission date Dec 07, 2007, through February 21, 2008.      a.     Abdominal ultrasound shows a splenic mass 3.1 x 2.4 x 2 cm.       Recommendation, follow-up with CT scan at [facility]      b.     Hematuria resolved at discharge.  The patient had a       cystoscopy on February 13, 2008.  Cytology shows mild atypia,       possible low-grade neoplasm.  Recommendation to follow up       pathology with urology.  Consult at [facility] for abnormal       bladder cells.  3. Left buttock abscess.  Continue Keflex and sitz baths.   SECONDARY DIAGNOSES:  1. Nonischemic cardiomyopathy, ejection fraction 10-15% at      echocardiogram, February 26, 2008.      a.     Severe cardiomyopathy  secondary to Adriamycin administration       for lymphoma.  2. Implantable cardioverter-defibrillator, [employer], implanted 2005 in      the setting of cardiogenic shock/acute renal failure which required      hemodialysis x3 weeks.  3. History of cerebrovascular accident, right-sided paresis.  4. Pulmonary hypertension.  5. History of non-Hodgkin's lymphoma in 1992.  6. Hodgkin's disease, 2007.  7. Left ventricular thrombi at echocardiogram at [facility].  Ejection      fraction 20%.  Coumadin therapy.  This study also showed akinesis  of the septum and inferior wall and hypokinesis of the lateral      wall.  8. Dyslipidemia.  9. 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  10.History of homicidal ideation, the patient is on Paxil.   PROCEDURE:  Device interrogated in the setting of ICD discharges,  providing longer ATP bursts and increasing first shock in VT zone to 31  joules.   BRIEF HISTORY:  Andre Tran is a 50 year old male.  He presents with two  episodes of his defibrillator firing.  At the interrogation, the device  registers two other events the evening before this admission on February 25, 2008.  The patient denies any prior experience of palpitations,  chest pain or breathing difficulty.   The patient was recently discharged from the [facility] in [place],  [place].  He has felt good since he had been home.  He has a good  appetite.  No shortness of breath, no chest pain.  Today, the patient  was paced for VT, which did not work.  He had received shocks x4.  He  converted to a sinus rhythm from ventricular tachycardia on his own.  The device was unable to convert him since defibrillator threshold was  high in this patient.   HOSPITAL COURSE:  The patient admitted on February 25, 2008, with  cardioverter defibrillator discharges.  They were appropriate, but they  did not stop his VT.  This stopped on its own.  The device was   reprogrammed to provide more effective therapy.  The patient was then  started on sotalol, which he received the evening of February 26, 2008.  He has now been on sotalol for 36 hours.  He has had no recurrence of  VT.  He has not had any NSVT either.  His QT is not prolonged.  He is  tolerating the medication well.  His Coreg has been discontinued.  The  patient has had no shortness of breath or chest pain while here at [facility].   His troponin I studies are mildly elevated.  They are 0.19, then 0.18,  then 0.16.  The patient is discharging after a sotalol load on February 28, 2008.  He is ambulating independently, although incidentally records  have been obtained from the [facility], and his wife has been kept in  conference about our treatment of this patient.  He was discharged on  the following medications:   1. Sotalol 160 mg twice daily, this is a new medication.  2. Enteric-coated aspirin 81 mg daily.  3. Keflex 250 mg twice daily.  4. Digoxin 0.125 mg daily.  5. Colace 100 mg daily.  6. Lasix 40 mg daily.  7. Lisinopril 5 mg daily.  8. Omeprazole 20 mg 2 tablets daily.  9. Paxil 20 mg daily.  10.Pravachol 40 mg daily at bedtime.  11.Aldactone 25 mg daily.  12.Coumadin 7.5 mg daily.  13.Carboxymethylcellulose 0.5% ophthalmic solution 1 drop to both eyes      four times daily.  14.Magnesium oxide 400 mg daily.  15.He also has Tylenol as needed.  16.He is asked to stop Coreg.   He has follow-up at [facility], [address]  An  electrocardiogram will be obtained on March 03, 2008, for Dr. Ladona Ridgel to  check.  He also follows up with the [facility] with Dr. Winfred Burn on  March 24, 2008, I believe it  is, and once again, a copy will be sent  to Ms. Ladona Ridgel, his wife to take to the office visit at the [facility].  However, if it is possible for [place] to communicate directly with the  [facility] by way of faxing to them, that would be  helpful.   LABORATORY STUDIES:  This admission, digoxin level is 0.4.  TSH is  0.925.  BNP on admission was 84.  Complete blood count this admission;  hemoglobin 15.5, hematocrit 47, white cells 6.3, platelets not  registered.  Serum electrolytes; sodium 133, potassium is 5.6, chloride 97, carbonate  27, BUN is 29, creatinine 0.92 and glucose is 118.  Alkaline phosphatase  is 204, SGOT is 37, SGPT is 24.  Once again, troponin I studies are  0.19, then 0.18, then 0.16.  Urinalysis was negative.      Maple Mirza, PA      Doylene Canning. Ladona Ridgel, MD  Electronically Signed    GM/MEDQ  D:  02/28/2008  T:  02/28/2008  Job:  [id number]   cc:   Doylene Canning. Ladona Ridgel, MD  Winfred Burn, MD

## 2010-11-23 NOTE — H&P (Signed)
Andre Tran, Andre Tran NO.:  [id number]   MEDICAL RECORD NO.:  [id number]          PATIENT TYPE:  INP   LOCATION:  1420                         FACILITY:  [facility]   PHYSICIAN:  Elliot Cousin, M.D.    DATE OF BIRTH:  July 13, 1960   DATE OF ADMISSION:  04/12/2007  DATE OF DISCHARGE:                              HISTORY & PHYSICAL   PRIMARY CARE PHYSICIAN:  Dr. Dorothyann Peng.   PHYSICIANS:  Gastroenterologist Dr. Charna Elizabeth.   CHIEF COMPLAINT:  Abdominal pain, cough, nausea and vomiting.   HISTORY OF PRESENT ILLNESS:  The patient is a 50 year old man with a  past medical history significant for non-Hodgkin's and Hodgkin's  lymphoma, nonischemic cardiomyopathy, type 2 diabetes mellitus, and  hypertension.  He presents to the emergency department with a chief  complaint of abdominal pain, nausea and vomiting, and cough.  His  abdominal pain started approximately one month ago.  It is located over  the left lower quadrant and left flank.  It has been mild to moderate in  intensity.  It has been intermittent.  The pain has been associated with  nausea and vomiting for the past three days.  He has vomited at least  once or twice each day for the past three days.  He denies bright red  blood in his emesis and coffee-ground consistency emesis.  He denies any  associated fever, chills or painful urination.  His last bowel movement  was yesterday, and he describes it as loose and brown.  He denies bright  red blood per rectum and black tarry stools.  The patient has also had a  dry cough for the past three weeks.  He has been treated with Nexium and  then Zegerid with no resolution in his cough.  He has had some  associated shortness of breath and orthopnea.  He has had an increase in  swelling of his legs.  Given the swelling in his legs, his primary care  physician, Dr. Allyne Gee, increased the Lasix.  Over the past two days, he  says that the swelling has subsided some.  He  states that he has chest  wall pain because of the unremitting coughing.   During the evaluation in the emergency department, the patient is noted  to be hemodynamically stable and afebrile.  Dr. Allyne Gee ordered a CT  scan of the abdomen and pelvis this morning.  The results revealed  interval proximal jejunal intussusception findings without proximal  obstruction or ischemic bowel finding.  No definite lead point pathology  is currently identified.  Interval right greater than left posterior  layering pleural effusions, very small pericardial effusion, and minimal  pericolic gutter edema/free fluid.  Stable liver and spleen size,  inferior accessory spleen, and small retroperitoneal and mesenteric  lymph nodes some of which demonstrate interval dystrophic calcification.  No evidence for locally recurrent nor metastatic abnormal lymphoma.  CT  scan of the pelvis revealed stable inguinal and external iliac to common  femoral lymph nodes without evidence for locally recurrent or metastatic  lymphoma, slight inferior pelvic free fluid;  otherwise, negative.  The  patient's lab data are significant for a serum sodium 129 and blood  glucose 281.  He will be admitted for further evaluation and management.   PAST MEDICAL HISTORY:  1. Non-Hodgkin's lymphoma and Hodgkin's lymphoma diagnosed in 1992 and      2001.  Status post chemotherapy and dissection of various lymph      nodes.  2. Nonischemic cardiomyopathy secondary to chemotherapy status post      biventricular ICD inserted in 2002.  The patient says that his      latest ejection fraction was estimated to be 45% in 2006.  3. History of congestive heart failure.  4. Type 2 diabetes mellitus diagnosed in 1995.  5. Small bowel obstruction 2001 , status post resection.  6. Status post vasectomy in 1985.   MEDICATIONS:  1. Aldactone 25 mg daily.  2. Lasix 40 mg daily in the morning and then 20 mg at noon.  3. Zocor 80 mg q.h.s.  4. Coreg  25 mg half a tablet b.i.d.  5. Lantus 56 units b.i.d.  6. Zegerid 40 mg/1100 mg daily.  7. Cough syrup, question name, 1 teaspoon every 12 hours as needed.   ALLERGIES:  No known drug allergies.   SOCIAL HISTORY:  The patient recently moved from [place].  He is  married.  He has twins.  [place]  his wife and children.  He denies tobacco, alcohol, and illicit drug  use.  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   FAMILY HISTORY:  The health of his father is unknown.  His mother is in  her 59s and had a questionable stroke.   REVIEW OF SYSTEMS:  Positive for cough, swelling in the legs, orthopnea,  PND, and occasional shortness of breath.   PHYSICAL EXAMINATION:  VITAL SIGNS:  Temperature 97.2, blood pressure  118/82, pulse 101, respiratory rate 18, oxygen saturation 97% on room  air.  GENERAL:  The patient is a pleasant 50 year old African-American man who  is currently sitting up in bed in no acute distress.  HEENT:  Head is normocephalic, atraumatic.  Pupils equal, round, and  reactive to light.  Extraocular muscles are intact.  Conjunctivae are  clear.  Sclerae are white.  Tympanic membranes are clear bilaterally.  Nasal mucosa is mildly dry.  No sinus tenderness.  Oropharynx revealed a  few missing teeth; otherwise, teeth are in fair condition.  Mucous  membranes are mildly dry.  No posterior exudates or erythema.  NECK:  Supple.  No adenopathy, no thyromegaly, no bruit.  CHEST:  Chest wall palpable, left upper chest wall ICD, nontender.  LUNGS:  Decreased breath sounds in the bases without any audible  crackles or wheezes.  HEART:  S1, S2 with an S3 gallop.  ABDOMEN:  Well-healed vertical abdominal scar.  Abdomen is mildly obese,  hypoactive bowel sounds, soft, moderately tender left lower quadrant.  No distention, no obvious masses palpated.  EXTREMITIES:  Right lower extremity is approximately trace to 1+ edema,  left lower extremity is 1+ to  2+ pitting edema.  NEUROLOGIC:  The patient is alert and oriented x3.  Cranial nerves II-  XII are intact.  Strength is 5/5 throughout.  Sensation is intact.   LABORATORY DATA:  Admission laboratories:  CT scan of the abdomen and  pelvis results are above.  EKG pending.   Lactic acid 1.6, lipase 10.  Sodium 129, potassium 4.7, chloride 94, CO2  27,  glucose 281, BUN 15, creatinine 1.02. Total bilirubin 1.5, alkaline  phosphatase 128.  SGOT 40, SGPT 31.  Total protein 5.2.  Albumin 3.1.  Calcium 8.4. WBC 7.5, hemoglobin 14.3, platelets 289.   ASSESSMENT:  1. Abdominal pain secondary to jejunal intussusception.  2. Right greater than left pleural effusion and small pericardial      effusion per CT scan of the abdomen.  The patient is noted to have      nonischemic cardiomyopathy with an ejection fraction 45% in 2006.  3. Hyponatremia.  The patient's serum sodium is 129.  4. Uncontrolled diabetes mellitus.  The patient's venous glucose is      281.   PLAN:  The patient will be admitted for further evaluation and  management.   1. Dr. Zachery Dakins, general surgeon, has been consulted.  2. Will check a repeat CT scan of the abdomen and pelvis without      contrast per the recommendation of  Dr. Zachery Dakins.  1. Will keep the patient n.p.o. except for medications.  2. Gentle IV fluids.  3. Will start intravenous Lasix 30 mg IV q.12 h.  4. Will start intravenous Pepcid, Claritin, and Tussionex for cough.  5. Diabetes control with Lantus and sliding scale NovoLog.  6. Will follow the patient's serum sodium closely.  7. Strict I's and O's and daily weights.  8. Will check an urinalysis and culture.  9. Will check a 2-D echocardiogram to evaluate the patient's ejection      fraction and pericardial effusion.      Elliot Cousin, M.D.  Electronically Signed     DF/MEDQ  D:  04/12/2007  T:  04/13/2007  Job:  [id number]   cc:   Candyce Churn. Allyne Gee, M.D.  Fax: [phone]   JXBJYN WGN FAOZ,  M.D.  Fax: [phone]

## 2010-11-23 NOTE — Discharge Summary (Signed)
NAMEJAVARION, DOUTY NO.:  [id number]   MEDICAL RECORD NO.:  [id number]          PATIENT TYPE:  IPS   LOCATION:  4030                         FACILITY:  [facility]   PHYSICIAN:  Ranelle Oyster, M.D.DATE OF BIRTH:  1961/03/25   DATE OF ADMISSION:  04/24/2007  DATE OF DISCHARGE:  05/15/2007                               DISCHARGE SUMMARY   DISCHARGE DIAGNOSES:  1. 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  2. Cardiomyopathy.  3. Fluid overload, compensated.  4. Diabetes mellitus type 2.  5. Urinary retention, resolved.  6. History of lymphoma.  7. Dyslipidemia.  8. Situational depression.   HISTORY OF PRESENT ILLNESS:  Mr. Rosemond is a 50 year old male with history  of lymphoma, recent admission for jejunal intussusception from October 2-  7.  He was readmitted October 7 p.m. with right-sided weakness,  difficulty speaking as well as gurgling and jerking of right body.  Initially, CT of head showed evidence of left MCA density.  The patient  underwent cerebral angio with TPA and unsuccessful attempt at clot  retrieval.  Carotid Dopplers done showed no ICA stenosis.  On October  10, the patient with decreased mental status and repeat CT of head  showed increased edema with large left MCA basal ganglia infarct with  hemorrhagic transformation and 4 mm midline shift.  The patient has had  slow improvement of mental status.  He currently continues with right  hemiparesis, left gaze preference, dysarthria significant cognitive  linguistic deficits.  Noted to have problems with dysphasia and is  currently on D-1 honey thick liquids.  PT and OT are ongoing and the  patient is noted to have significant DOE with tachycardia with all  activities.  Able to sit edge of bed 10 minutes with mod assist.  Has  been able to stand with mod assist, facilitate right lower extremity.  Rehab consult for further therapies.   PAST MEDICAL HISTORY:  Significant for:  1. Nonischemic cardiomyopathy.  2. Jejunal intussusception from October 2-7, resolved.  3. Diabetes mellitus type 2, uncontrolled.  4. Hypertension.  5. Lymphoma.  6. Dyslipidemia.  7. History of V-tach with AICD and permanent pacemaker.  8. History of CHF and small bowel resection in '01.   ALLERGIES:  No known drug allergies.   FAMILY HISTORY:  Negative for coronary artery disease, diabetes, cancers  or CVA.   SOCIAL HISTORY:  The patient is married, is on medical disability and  retired from [employer]  Does not use any tobacco or alcohol.  The patient  was independent prior to admission.   HOSPITAL COURSE:  Mr. Markies Mowatt was admitted to rehab on April 24, 2007, for inpatient therapies to consist of PT, OT daily.  Initially,  the patient was maintained on dysphagia diet of D-1 honey thick liquids  and on IV fluids for hydration.  The patient was noted to have crackles  with shortness of breath on October 15 a.m. and his Lasix was increased  to 40 mg b.i.d.  Chest x-ray was done on October 15 showing cardiomegaly  with improvement in airspace disease consistent with improvement in  edematous changes.   LABORATORY DATA:  Hemoglobin 12.8, hematocrit 39.1, white count 6.8,  platelets 219.  Check of lytes revealed sodium 140, potassium 3.5,  chloride 109, CO2 25, BUN 11, creatinine 0.5, glucose 73.  BNP was  checked on October 17 showing this on downward trend at 569.  Secondary  to a history of cardiomyopathy, Coreg was resumed at 12.5 mg b.i.d. and  increased to 25 mg b.i.d. during this stay.  Aldactone was resumed at 25  mg b.i.d.  Blood pressures have been monitored on b.i.d. basis and have  ranged from 80-90's systolic, 60-70's diastolic.  The patient has had no  complaints of dizziness or orthostasis.  Heart rate stable in  80's  range.  Blood sugars have been monitored on morning and evening basis  and Lantus insulin has slowly been titrated up for tighter control.  At  time of discharge, blood sugars ranging from 130-180's range.  The  patient's voiding was monitored with PVR checks.  He was noted to have  some retention problems and initially required in-and-out cath.  He was  started on Flomax with resolution of his retention.  By time of  discharge, the patient is voiding without difficulty.  He does require  scheduled toileting to avoid incontinence.  The patient's lytes have  been monitored with sequential checks secondary to increase in Lasix.  As the patient's dysphasia has improved, he was advanced to regular diet  with thin liquids.  Most recent lytes of October 9 revealed sodium 135,  potassium 3.9, chloride 97, CO2 20, BUN 10, creatinine 0.9, glucose 134.  Last BNP of October 17 is at 569.   With advancement of the patient's diet, his p.o. intake has improved.  He was noted to have problems with internal distractibility and was  started on Ritalin to help with attention to tasks.  He was also started  on Lexapro for situational depression and Dr. Leonides Cave, Neuropsych has  been following along for support as wife with concerns regarding the  patient's behavior.  Dr. Leonides Cave felt that the patient's expressive  aphagia is a major barrier to understanding his thoughts and feelings.  The patient denied feeling depressed, hopeless, suicidal or homicidal  thoughts or anger.  Currently, Neuropsych support not indicated.  Speech  has been following the patient along and the patient continues to  demonstrate poor verbal expressive skills as evidenced by reduced word  finding, disorganized thought processes secondary to decrease in  orientation, memory and awareness.  He also occasionally exhibits  dysfluent speech and occasionally does show attempts to self correct.  Cognition is affected  by reduced levels  of awareness regarding deficits  as well as secondary to severity of disorganized language and thought  processes as well as reduced and limited insights, awareness and  reasoning skills.  OT has been working with the patient regarding self-  care.  Currently the patient has made good progress requiring min assist  for self-care tasks.  He continues to be limited by safety awareness and  attention to tasks.  He requires some set up assist for upper body  dressing, otherwise is min assist for self-care and toileting.  Currently he is supervision for basic transfers, able to ambulate 100  feet with min  assist; able to navigate stairs with min assist; able to  propel wheelchair for 200 feet.  He will continue with further followup,  Home Health PT, OT, speech therapy by Home [facility].  Next labs  to be checked by Home Health RN on November 6 with results to Dr. Riley Kill  and Dr. Allyne Gee.   On May 15, 2007, the patient is discharged to home.   DISCHARGE MEDICATIONS:  1. Start coated aspirin 325 mg a day.  2. Reglan 5 mg p.o. at 7 a.m. and 12 noon, #60 prescription for this      month.  Additional prescription given for December for 60 pills to      be filled no earlier than December 1.  3. Coreg 25 mg p.o. b.i.d.  4. Lasix 40 mg b.i.d.  5. Lantus insulin 28 units in a.m., 22 units in p.m.  6. Artificial Tears one GGT both eyes b.i.d.  7. Flomax 0.4 mg q.h.s.  8. Aldactone 25 mg a day.  9. Zocor 80 mg p.o. q.h.s.  10.Lexapro 5 mg p.o. q.h.s.  11.Zegerid 400/1000 mg p.o. per day.   DISCHARGE DIET:  Diabetic diet, low salt.   SPECIAL INSTRUCTIONS:  Twenty-four hour supervision assistance.  Walk  only with assistance.  Check blood sugars b.i.d. and q.h.s.  [facility]  Home Care for PT, OT, speech therapy and RN.   FOLLOW UP:  The patient to follow up with Dr. Allyne Gee in 2 weeks.  Follow up with Dr. Pearlean Brownie in 4 weeks.  Follow up with Dr. Riley Kill December  5 at 10:40.       Greg Cutter, P.A.      Ranelle Oyster, M.D.  Electronically Signed    PP/MEDQ  D:  05/15/2007  T:  05/16/2007  Job:  [id number]   cc:   Candyce Churn. Allyne Gee, M.D.  Deanna Artis. Sharene Skeans, M.D.

## 2010-11-23 NOTE — H&P (Signed)
NAME:  Andre Tran, Andre Tran NO.:  [id number]   MEDICAL RECORD NO.:  [id number]          PATIENT TYPE:  INP   LOCATION:  3106                         FACILITY:  [facility]   PHYSICIAN:  Deanna Artis. Hickling, M.D.DATE OF BIRTH:  02/22/1961   DATE OF ADMISSION:  04/17/2007  DATE OF DISCHARGE:                              HISTORY & PHYSICAL   CHIEF COMPLAINT:  Not speaking.   HISTORY OF PRESENT ILLNESS:  A 50 year old [place] American man with a  history of non-Hodgkin's lymphoma, ischemic cardiomyopathy, admitted  October 2-7 with a jejunal intussusception without GI bleeding, which  corrected itself.   The patient had just come home from the hospital this evening.  He was  last known normal at 1720 hours.  His wife awakened between 1730 and  1745 with the patient gurgling and jerking his right body, unable to  speak.  He was later unable to move the right side.  He was brought to  the [facility] emergency room.  Code stroke was called at 1817 hours.  CT scan was carried out at 1833, results known to me at 1840 when I  arrived.  Time of the initial examination 1845.  NIH Stroke Scale at  that time was 17.   CT scan showed evidence of a left middle cerebral artery increased  density.  The examination was consistent with a left hemispheric stroke.  t-PA was ordered and was pushed.  Loading dose was pushed at 1917 hours,  drip at 1920 hours.  His risk factors for stroke include diabetes  mellitus, dyslipidemia, obesity, sedentary lifestyle.   CURRENT MEDICATIONS:  1. Aldactone 25 mg daily.  2. Lasix 40 mg daily.  3. Zocor 80 mg at bedtime.  4. Coreg 25 mg 1/2 tablet twice daily.  5. Lantus insulin 56 units twice daily.  6. Zegerid  40/1100 mg one daily.   DRUG ALLERGIES:  None known.   FAMILY HISTORY:  Father's history is unknown.  Mother had a possible  stroke.   SOCIAL HISTORY:  The patient moved from [place].  He is married.  He  has twin girls.  He does not use  tobacco, alcohol or drugs.   REVIEW OF SYSTEMS:  The patient had not had prior neurologic disorder.  He had swelling in his legs.  He has never had a heart attack.  The  patient has had cough, orthopnea, shortness of breath.  He had GI pain,  which had resolved.  GU: Negative.  MUSCULOSKELETAL:  Negative.  ENDOCRINE:  Diabetes mellitus.  REPRODUCTIVE:  negative.  HEMATOLOGIC/LYMPHATIC:  History of lymphoma.  ALLERGY/IMMUNOLOGY:  Unknown.  PSYCHIATRIC:  Negative.   EXAMINATION:  Temperature 97, blood pressure 131/94, heart rate 92,  respirations 29, oxygen saturation 100% on 3 L.  Weight 180 pounds.  HEAD, EYES, EARS, NOSE AND THROAT:  No signs of infection.  NECK:  Supple.  No bruits.  LUNGS:  Clear.  HEART:  No murmurs.  ABDOMEN:  Soft.  Bowel sounds normal.  SKIN:  Negative.  NEUROLOGIC:  The patient's eyes are deviated to the left.  He had right  homonymous hemianopsia.  He has right central VII, right hemiparesis 2/5  in the arm, 3/5 in the leg.  Left side is normal.  He was not talking  initially.  After pushing the IV t-PA,now at 1755 hours he is able to  speak, although his speech dysfluent.   His laboratory studies are as follows:  Sodium 134, potassium 4.4,  chloride 97, CO2 25, BUN 18, creatinine 1.74, glucose 149.  White count  5600, hemoglobin 14.3, hematocrit 42.7, platelet count 268,000, 39  polys, 49 lymphs, 9 monos, 2 eosinophils.  PT 14.7, INR 1.1, PTT 30.  Other laboratory studies:  AST 22, ALT 23, total protein 5.4, albumin  3.1, calcium 8.8, total bilirubin 1.5, alkaline phosphatase 120.   IMPRESSION:  1. 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  2. Hypertension. 404.10  3. Uncontrolled diabetes mellitus.  4. Dyslipidemia.  5. History of lymphoma.  6. Recent intussusception.  7. Ischemic cardiomyopathy, EKG shows regular sinus rhythm.   The patient has been given 2/3 t-PA and will have an arteriogram and  then a  decision made of MERCI device versus other clot retrieval device  versus a IA t-PA.  The wife has been given informed consent.  I have  spent 90 minutes of critical care providing assessment, orders, having  the patient transported from [facility] to [facility], discussing  findings with the family and with Dr. Corliss Skains.      Deanna Artis. Sharene Skeans, M.D.  Electronically Signed     [facility]/MEDQ  D:  04/17/2007  T:  04/18/2007  Job:  [id number]

## 2010-11-23 NOTE — Discharge Summary (Signed)
NAME:  Andre Tran, Andre Tran                  ACCOUNT NO.:  [id number]   MEDICAL RECORD NO.:  [id number]          PATIENT TYPE:  INP   LOCATION:  3106                         FACILITY:  [facility]   PHYSICIAN:  Pramod P. Pearlean Brownie, MD    DATE OF BIRTH:  27-May-1961   DATE OF ADMISSION:  04/17/2007  DATE OF DISCHARGE:  04/24/2007                               DISCHARGE SUMMARY   DISCHARGE DIAGNOSES:  1. Left basal ganglia infarct with hemorrhagic transformation, status      post intraventricular and intra-arterial tissue plasminogen      activator.  Stroke was felt to be secondary to clot in left middle      cerebral artery secondary to atrial fibrillation.  2. Atrial fibrillation.  3. Hypertension.  4. Dyslipidemia.  5. Uncontrolled diabetes.  6. History of lymphoma.  7. Recent intussusception, discharged October 2008.  8. Ischemic cardiomyopathy.  9. Dysphagia.   MEDICINES AT TIME OF DISCHARGE:  1. Aldactone 25 mg a day  2. Lasix 40 mg b.i.d.  3. Zocor 80 mg a day.  4. Coreg 12.5 mg b.i.d., hold for systolic blood pressure less than      120.  5. Lantus 22 units b.i.d., though he was on 56 units b.i.d. prior to      admission.  6. Zegerid 40 mg a day.   STUDIES PERFORMED:  1. CT of the brain on admission shows motion-degraded exam suspicious      for early/acute left MCA infarct, possible acute thrombus in the      left MCA.  2. Chest x-ray shows marginal worsening of CHF.  3. CT of the head 24 hours after t-PA shows left basal ganglia infarct      with a small amount of peripheral hemorrhage.  No hydrocephalus or      intraventricular hemorrhage.  Miniscule punctate hypodensity along      the course of the left middle cerebral artery noted.  4. Follow-up chest x-ray shows congestive heart failure.  5. CT of the head after neurologic worsening shows an increase in size      of the left basal ganglia hemorrhage, which now demonstrates a      hematocrit level and has some increased mild mass  effect.  There is      a 5-mm left-to-right shift, hypodensity in the head of the caudate      on the left compatible with infarct.  There is also some effacement      of the frontal horn of the left lateral ventricle but no      hydrocephalus or intraventricular hemorrhage identified.  6. Follow-up CT 2 days later shows increasing edema surrounding a      large left MCA basal ganglia infarct with hemorrhagic      transformation.  Midline shift is about 4 mm.  7. CT of the head on October 11 shows large hemorrhagic infarct      involving the left basal ganglia with surrounding edema and      slightly increased left-to-right midline shift.  8. CT of the head on  October 13 shows similar size and appearance of      infarct with hemorrhagic transformation but no definite increased      hemorrhage.  9. Last chest x-ray done October 11 shows no change in congestive      heart failure, feeding tube into the fundus of the stomach.  10.Carotid Doppler shows vertebral artery flow antegrade bilaterally.      No significant right or left ICA stenosis.  11.2-D echocardiogram shows EF of 10-15% with severe, diffuse left      ventricular hypokinesis.  There was no obvious embolic source.      There was appearance of a catheter or pacing wire in the right      ventricle.  There was malcoaptation of the tricuspid valve      leaflets.  There was a small pericardial effusion.  There was no      evidence of cardiac tamponade.   LABORATORY STUDIES:  CBC:  Hemoglobin 12.2, hematocrit 37.3, otherwise  normal.  Chemistry with glucose 184, otherwise normal.  Coagulations  with INR of 1.3.  Liver function tests with AST 22, ALT 34, alkaline  phosphatase 120 and total bilirubin 1.5, albumin 3.1.  Cholesterol 145,  triglycerides 68, HDL 35, LDL 96, calcium 8.1.  Urine drug screen  negative.  Differential negative.  Homocystine normal.   HISTORY OF PRESENT ILLNESS:  [name] is a 50 year old African   American male with a history of non-Hodgkin's lymphoma, ischemic  cardiomyopathy with EF of 10-50%, and a recent jejunal intussusception  in October 2008 without GI bleeding, which corrected itself.  The  patient had just come home from the hospital the evening of admission.  He was last known to be normal at 5:20 p.m.  His wife was awakened  between 5:30 and 5:45 with the patient gurgling and jerking his right  body, unable to speak.  He was later unable to move the right side.  He  was brought to the [facility] emergency room, where a Code Stroke was  called.  CT scan was performed at 6:33 and resulted at 6:40 p.m.  At the  time of initial examination, the NIH Stroke Scale was 17.  CT scan  showed evidence of left middle cerebral artery increased density.  The  exam was consistent with a left hemispheric stroke.  T-PA was ordered  and infused.  He was transferred to [facility] for admission to  the neuro intensive care and further evaluation.   HOSPITAL COURSE:  Twenty-four hours post t-PA, CT was unremarkable  except for some very small hemorrhagic transformation.  The patient did  have neurologic worsening after that initial time period and CT revealed  a significant hemorrhagic transformation with midline shift and mass  effect and edema.  Prior to neurologic worsening, the patient was  extubated without difficulty.  He does have multiple vascular risk  factors, but this stroke was felt to be secondary to a left MCA clot  causing a basal ganglia infarct found to be secondary to atrial  fibrillation.  He was left in the ICU for several days until the  hemorrhage stabilized, then was transferred to the floor.  He had strict  control of his hypertension, and temperature and glucose.  He was  evaluated by speech therapy and placed on a dysphagia 1 diet.  He was  evaluated by PT and OT and felt to be a good rehab candidate.  As the  patient had had symptomatic  side effects of  t-PA with hemorrhage,  aspirin and other antiplatelets were held.  Plans are to resume aspirin  81 mg within 2 weeks of hemorrhage.  In the meanwhile the patient was  transferred to rehab for continuation of therapies.  Eventual plans were  to return home with his wife.   CONDITION ON DISCHARGE:  The patient was alert, oriented to person and  situation, though place is sometimes unclear.  He does follow commands.  He has a left gaze preference.  He can cross his eyes to the midline,  past the midline to the left.  He has dense right hemiparesis with 0/5  in his hand but 1/5 in his shoulder and 2/5 in his lower extremity.  His  heart rate is irregular irregular.  His breath sounds are clear but  decreased in the bases.   DISCHARGE PLAN:  1. Transfer to rehab for continuation of PT, OT and speech therapy.  2. Began aspirin 81 mg two weeks after hemorrhage.  3. Follow up, primary care physician, for tight risk factor control.      Goal systolic blood pressure less than 130, goal hemoglobin A1c      less than 6.5, goal LDL less than 100.   FOLLOW-UP:  Dr. Delia Heady in 2 months.      Annie Main, N.P.    ______________________________  Sunny Schlein. Pearlean Brownie, MD    SB/MEDQ  D:  04/24/2007  T:  04/25/2007  Job:  [id number]   cc:   Anselm Pancoast. Zachery Dakins, M.D.  Beckey Rutter, MD  Jordan Hawks Elnoria Howard, MD

## 2011-02-16 ENCOUNTER — Telehealth: Payer: Self-pay | Admitting: *Deleted

## 2011-02-16 NOTE — Telephone Encounter (Signed)
Patient is in the dentist's office for dental cleaning. The dentist would like to know if pt. needs antibiotics prior cleaning. Patient has a pacemaker defibrillator change with in the last year. According to [facility] Rn pt. Does not need antibiotics prior dental cleaning. Dentist staff aware.

## 2011-03-22 ENCOUNTER — Encounter: Payer: Self-pay | Admitting: *Deleted

## 2011-03-28 ENCOUNTER — Ambulatory Visit (INDEPENDENT_AMBULATORY_CARE_PROVIDER_SITE_OTHER): Payer: Medicare Other | Admitting: *Deleted

## 2011-03-28 ENCOUNTER — Encounter: Payer: Self-pay | Admitting: Internal Medicine

## 2011-03-28 DIAGNOSIS — I472 Ventricular tachycardia: Secondary | ICD-10-CM

## 2011-03-28 LAB — ICD DEVICE OBSERVATION
BAMS-0003: 50 {beats}/min
DEVICE MODEL ICD: 593606
HV IMPEDENCE: 46 Ohm
MODE SWITCH EPISODES: 2
RV LEAD IMPEDENCE ICD: 362.5 Ohm
RV LEAD THRESHOLD: 0.75 V
TOT-0006: 20100802000000
TOT-0007: 1
TOT-0010: 7
TZAT-0004SLOWVT: 8
TZAT-0012SLOWVT: 200 ms
TZAT-0013SLOWVT: 4
TZAT-0018SLOWVT: NEGATIVE
TZON-0003SLOWVT: 310 ms
TZST-0001SLOWVT: 3
TZST-0003SLOWVT: 40 J

## 2011-04-20 LAB — DIFFERENTIAL
Basophils Absolute: 0
Basophils Relative: 1
Eosinophils Absolute: 0.3
Monocytes Relative: 9
Neutro Abs: 3.7
Neutrophils Relative %: 54

## 2011-04-20 LAB — CBC
HCT: 39.1
Hemoglobin: 12.8 — ABNORMAL LOW
Hemoglobin: 13.3
MCHC: 32.7
MCHC: 32.7
RBC: 4.09 — ABNORMAL LOW
RBC: 4.26
RDW: 13.9

## 2011-04-20 LAB — BASIC METABOLIC PANEL
CO2: 28
CO2: 31
Calcium: 8.6
Calcium: 8.7
Calcium: 8.9
Calcium: 9
Creatinine, Ser: 1.06
Creatinine, Ser: 1.08
Creatinine, Ser: 1.14
GFR calc Af Amer: >60
GFR calc Af Amer: >60
GFR calc Af Amer: >60
GFR calc Af Amer: >60
GFR calc Af Amer: >60
GFR calc non Af Amer: >60
GFR calc non Af Amer: >60
GFR calc non Af Amer: >60
GFR calc non Af Amer: >60
Potassium: 3.9
Sodium: 134 — ABNORMAL LOW
Sodium: 135
Sodium: 135
Sodium: 139
Sodium: 140

## 2011-04-20 LAB — URINALYSIS, ROUTINE W REFLEX MICROSCOPIC
Bilirubin Urine: NEGATIVE
Glucose, UA: NEGATIVE
Hgb urine dipstick: NEGATIVE
Ketones, ur: NEGATIVE
Leukocytes, UA: NEGATIVE
Leukocytes, UA: NEGATIVE
Nitrite: NEGATIVE
Protein, ur: 30 — AB
Protein, ur: 30 — AB
Specific Gravity, Urine: 1.017
Urobilinogen, UA: >8 — ABNORMAL HIGH

## 2011-04-20 LAB — COMPREHENSIVE METABOLIC PANEL
ALT: 18
Alkaline Phosphatase: 81
BUN: 11
CO2: 25
GFR calc non Af Amer: >60
Glucose, Bld: 73
Potassium: 3.8
Sodium: 140
Total Protein: 5.3 — ABNORMAL LOW

## 2011-04-20 LAB — URINE MICROSCOPIC-ADD ON

## 2011-04-20 LAB — B-NATRIURETIC PEPTIDE (CONVERTED LAB)
Pro B Natriuretic peptide (BNP): 396 — ABNORMAL HIGH
Pro B Natriuretic peptide (BNP): 569 — ABNORMAL HIGH
Pro B Natriuretic peptide (BNP): 684 — ABNORMAL HIGH
Pro B Natriuretic peptide (BNP): 800 — ABNORMAL HIGH

## 2011-04-20 LAB — URINE CULTURE
Colony Count: 4000
Colony Count: NO GROWTH
Special Requests: NEGATIVE

## 2011-04-20 LAB — PREALBUMIN: Prealbumin: 12.2 — ABNORMAL LOW

## 2011-04-21 LAB — CBC
HCT: 37.3 — ABNORMAL LOW
HCT: 41.9
HCT: 41.3
HCT: 41.8
HCT: 42.7
Hemoglobin: 12.2 — ABNORMAL LOW
Hemoglobin: 13.9
Hemoglobin: 14
Hemoglobin: 14.3
Hemoglobin: 14.3
MCHC: 32.8
MCHC: 33.3
MCHC: 33.4
MCHC: 33.7
MCV: 95
MCV: 95
MCV: 93.6
MCV: 94
Platelets: 201
Platelets: 210
Platelets: 244
Platelets: 268
RBC: 3.92 — ABNORMAL LOW
RBC: 4.41
RBC: 4.41
RDW: 13.8
RDW: 14
RDW: 14.1 — ABNORMAL HIGH
RDW: 14.3 — ABNORMAL HIGH
WBC: 6.4
WBC: 7.7
WBC: 4.7
WBC: 5.6
WBC: 7.5

## 2011-04-21 LAB — COMPREHENSIVE METABOLIC PANEL
ALT: 31
ALT: 34
AST: 22
Albumin: 3.1 — ABNORMAL LOW
Albumin: 3.1 — ABNORMAL LOW
Alkaline Phosphatase: 120 — ABNORMAL HIGH
Alkaline Phosphatase: 128 — ABNORMAL HIGH
BUN: 18
CO2: 30
Calcium: 8.7
Chloride: 102
Chloride: 94 — ABNORMAL LOW
Chloride: 97
Creatinine, Ser: 1.09
GFR calc non Af Amer: >60
Glucose, Bld: 149 — ABNORMAL HIGH
Glucose, Bld: 150 — ABNORMAL HIGH
Glucose, Bld: 281 — ABNORMAL HIGH
Potassium: 4.4
Potassium: 4.7
Sodium: 129 — ABNORMAL LOW
Sodium: 134 — ABNORMAL LOW
Total Bilirubin: 1.2
Total Bilirubin: 1.5 — ABNORMAL HIGH
Total Protein: 5.2 — ABNORMAL LOW
Total Protein: 5.4 — ABNORMAL LOW

## 2011-04-21 LAB — DRUGS OF ABUSE SCREEN W/O ALC, ROUTINE URINE
Amphetamine Screen, Ur: NEGATIVE
Barbiturate Quant, Ur: NEGATIVE
Benzodiazepines.: NEGATIVE
Marijuana Metabolite: NEGATIVE
Phencyclidine (PCP): NEGATIVE
Propoxyphene: NEGATIVE

## 2011-04-21 LAB — BASIC METABOLIC PANEL
BUN: 12
BUN: 15
BUN: 15
BUN: 17
CO2: 24
CO2: 27
CO2: 27
CO2: 28
Calcium: 8.1 — ABNORMAL LOW
Calcium: 8.3 — ABNORMAL LOW
Calcium: 8.5
Chloride: 106
Chloride: 110
Chloride: 111
Creatinine, Ser: 0.96
Creatinine, Ser: 1.05
Creatinine, Ser: 1.13
Creatinine, Ser: 0.95
GFR calc Af Amer: >60
GFR calc Af Amer: >60
GFR calc non Af Amer: >60
GFR calc non Af Amer: >60
GFR calc non Af Amer: >60
GFR calc non Af Amer: >60
Glucose, Bld: 112 — ABNORMAL HIGH
Glucose, Bld: 184 — ABNORMAL HIGH
Glucose, Bld: 125 — ABNORMAL HIGH
Glucose, Bld: 228 — ABNORMAL HIGH
Potassium: 3.7
Potassium: 4.4
Potassium: 3.6
Sodium: 133 — ABNORMAL LOW

## 2011-04-21 LAB — HOMOCYSTEINE: Homocysteine: 10.1

## 2011-04-21 LAB — DIFFERENTIAL
Basophils Absolute: 0
Basophils Absolute: 0
Basophils Relative: 0
Basophils Relative: 1
Basophils Relative: 1
Basophils Relative: 0
Basophils Relative: 1
Eosinophils Absolute: 0
Eosinophils Absolute: 0.1
Eosinophils Absolute: 0.1
Eosinophils Relative: 0
Lymphocytes Relative: 40
Lymphs Abs: 1.9
Lymphs Abs: 2.6
Monocytes Absolute: 0.1 — ABNORMAL LOW
Monocytes Absolute: 0.5
Monocytes Absolute: 0.5
Monocytes Absolute: 0.5
Monocytes Relative: 13 — ABNORMAL HIGH
Monocytes Relative: 7
Monocytes Relative: 7
Monocytes Relative: 9
Neutro Abs: 3.3
Neutro Abs: 5.3
Neutrophils Relative %: 52
Neutrophils Relative %: 61
Neutrophils Relative %: 74
Neutrophils Relative %: 53

## 2011-04-21 LAB — LIPID PANEL
HDL: 35 — ABNORMAL LOW
LDL Cholesterol: 96
Total CHOL/HDL Ratio: 4.1
Triglycerides: 68
VLDL: 14

## 2011-04-21 LAB — APTT: aPTT: 60 — ABNORMAL HIGH

## 2011-04-21 LAB — PROTIME-INR: INR: 1.3

## 2011-04-21 LAB — URINE MICROSCOPIC-ADD ON

## 2011-04-21 LAB — URINALYSIS, ROUTINE W REFLEX MICROSCOPIC
Hgb urine dipstick: NEGATIVE
Nitrite: NEGATIVE
Specific Gravity, Urine: 1.028
pH: 6

## 2011-04-21 LAB — BLOOD GAS, ARTERIAL
Acid-base deficit: 0.2
Acid-base deficit: 1.8
Bicarbonate: 22.1
Bicarbonate: 23.6
FIO2: 0.6
MECHVT: 550
TCO2: 23.2
TCO2: 24.7
pCO2 arterial: 33.9 — ABNORMAL LOW
pCO2 arterial: 36.4
pH, Arterial: 7.428
pO2, Arterial: 131 — ABNORMAL HIGH

## 2011-04-21 LAB — CHOLESTEROL, TOTAL: Cholesterol: 141

## 2011-04-21 LAB — URINE CULTURE: Colony Count: NO GROWTH

## 2011-04-21 LAB — TSH: TSH: 1.552

## 2011-04-22 LAB — URINALYSIS, ROUTINE W REFLEX MICROSCOPIC
Bilirubin Urine: NEGATIVE
Hgb urine dipstick: NEGATIVE
Ketones, ur: NEGATIVE
Protein, ur: NEGATIVE
Urobilinogen, UA: 1

## 2011-04-22 LAB — DIFFERENTIAL
Basophils Relative: 0
Basophils Relative: 1
Eosinophils Absolute: 0.1
Eosinophils Absolute: 0.1
Eosinophils Absolute: 0.1
Eosinophils Relative: 3
Lymphocytes Relative: 40
Lymphs Abs: 3.2
Monocytes Absolute: 0.3
Monocytes Relative: 6
Monocytes Relative: 7
Neutro Abs: 4.1
Neutrophils Relative %: 51
Neutrophils Relative %: 46
Neutrophils Relative %: 58

## 2011-04-22 LAB — BASIC METABOLIC PANEL
BUN: 17
BUN: 27 — ABNORMAL HIGH
CO2: 22
CO2: 24
Calcium: 8.9
Calcium: 9.5
Chloride: 102
Chloride: 106
Chloride: 108
Creatinine, Ser: 0.75
Creatinine, Ser: 0.9
Creatinine, Ser: 0.93
GFR calc Af Amer: >60
GFR calc Af Amer: >60
GFR calc Af Amer: >60
GFR calc non Af Amer: >60
Glucose, Bld: 85
Potassium: 2.9 — ABNORMAL LOW
Potassium: 3.5
Sodium: 137
Sodium: 138

## 2011-04-22 LAB — I-STAT 8, (EC8 V) (CONVERTED LAB)
BUN: 27 — ABNORMAL HIGH
Chloride: 102
Glucose, Bld: 145 — ABNORMAL HIGH
HCT: 45
Hemoglobin: 15.3
Operator id: 116391
Potassium: 3.8
Sodium: 137

## 2011-04-22 LAB — CARDIAC PANEL(CRET KIN+CKTOT+MB+TROPI)
CK, MB: 0.8
Relative Index: INVALID
Total CK: 40
Troponin I: 0.04
Troponin I: 0.05

## 2011-04-22 LAB — CBC
Hemoglobin: 12.6 — ABNORMAL LOW
Hemoglobin: 12.9 — ABNORMAL LOW
Hemoglobin: 14
MCHC: 33.2
MCHC: 33.4
MCHC: 33.4
MCV: 92.7
MCV: 92.3
MCV: 92.8
MCV: 93.3
Platelets: 240
Platelets: 234
RBC: 4.04 — ABNORMAL LOW
RBC: 4.2 — ABNORMAL LOW
RBC: 4.53
RBC: 4.59
WBC: 8.1
WBC: 6.1
WBC: 6.9

## 2011-04-22 LAB — COMPREHENSIVE METABOLIC PANEL
ALT: 15
AST: 19
Alkaline Phosphatase: 86
CO2: 23
Glucose, Bld: 97
Potassium: 3.5
Sodium: 137
Total Protein: 6

## 2011-04-22 LAB — LIPID PANEL
HDL: 35 — ABNORMAL LOW
Total CHOL/HDL Ratio: 5.1
Triglycerides: 63
VLDL: 13

## 2011-04-22 LAB — HEMOGLOBIN A1C: Mean Plasma Glucose: 325

## 2011-04-22 LAB — POCT CARDIAC MARKERS
CKMB, poc: <1 — ABNORMAL LOW
CKMB, poc: <1 — ABNORMAL LOW
Myoglobin, poc: 27.9
Myoglobin, poc: 36
Myoglobin, poc: 40.2
Operator id: 161631
Operator id: 161631

## 2011-04-22 LAB — B-NATRIURETIC PEPTIDE (CONVERTED LAB)
Pro B Natriuretic peptide (BNP): 449 — ABNORMAL HIGH
Pro B Natriuretic peptide (BNP): 555 — ABNORMAL HIGH

## 2011-04-22 LAB — CROSSMATCH

## 2011-04-22 LAB — MAGNESIUM: Magnesium: 2.1

## 2011-04-22 LAB — TROPONIN I: Troponin I: 0.04

## 2011-06-13 ENCOUNTER — Encounter: Payer: Self-pay | Admitting: *Deleted

## 2011-06-13 ENCOUNTER — Encounter: Payer: Self-pay | Admitting: Internal Medicine

## 2011-06-14 ENCOUNTER — Ambulatory Visit (INDEPENDENT_AMBULATORY_CARE_PROVIDER_SITE_OTHER): Payer: Medicare Other | Admitting: Internal Medicine

## 2011-06-14 ENCOUNTER — Encounter: Payer: Self-pay | Admitting: Internal Medicine

## 2011-06-14 DIAGNOSIS — I10 Essential (primary) hypertension: Secondary | ICD-10-CM

## 2011-06-14 DIAGNOSIS — I472 Ventricular tachycardia, unspecified: Secondary | ICD-10-CM

## 2011-06-14 DIAGNOSIS — I429 Cardiomyopathy, unspecified: Secondary | ICD-10-CM

## 2011-06-14 DIAGNOSIS — Z95810 Presence of automatic (implantable) cardiac defibrillator: Secondary | ICD-10-CM

## 2011-06-14 LAB — ICD DEVICE OBSERVATION
AL THRESHOLD: 1 V
ATRIAL PACING ICD: 0.01 pct
BAMS-0001: 170 {beats}/min
BAMS-0003: 50 {beats}/min
DEV-0020ICD: NEGATIVE
DEVICE MODEL ICD: 593606
FVT: 0
MODE SWITCH EPISODES: 2
PACEART VT: 0
RV LEAD AMPLITUDE: 10.5 mv
RV LEAD THRESHOLD: 0.75 V
TOT-0006: 20100802000000
TZAT-0001SLOWVT: 1
TZAT-0004SLOWVT: 8
TZAT-0012SLOWVT: 200 ms
TZAT-0013SLOWVT: 4
TZAT-0020SLOWVT: 1 ms
TZON-0005SLOWVT: 6
TZST-0001SLOWVT: 4
TZST-0003SLOWVT: 30 J
VENTRICULAR PACING ICD: 0 pct

## 2011-06-14 NOTE — Assessment & Plan Note (Signed)
His blood pressure is well controlled. He will continue his current medical therapy. He will maintain a low-sodium diet

## 2011-06-14 NOTE — Assessment & Plan Note (Signed)
Interrogation of his device demonstrates no recurrent ventricular arrhythmias. He will continue sotalol.

## 2011-06-14 NOTE — Progress Notes (Signed)
HPI [name] returns today for followup. He is a very pleasant 50 year old man with a history of a nonischemic cardiomyopathy, ventricular tachycardia, chronic systolic heart failure, and dyslipidemia. The patient continues to do well. He denies any syncope or recent ICD shocks. He denies chest pain or shortness of breath. No peripheral edema. He denies dietary or medical noncompliance. No Known Allergies   Current Outpatient Prescriptions  Medication Sig Dispense Refill  . aspirin 81 MG tablet Take 81 mg by mouth daily.        . digoxin (LANOXIN) 0.125 MG tablet Take 125 mcg by mouth daily.        . furosemide (LASIX) 40 MG tablet Take 40 mg by mouth daily.        . insulin aspart protamine-insulin aspart (NOVOLOG 70/30) (70-30) 100 UNIT/ML injection Inject 80 Units into the skin daily.       . magnesium oxide (MAG-OX) 400 MG tablet Take 400 mg by mouth daily.        Marland Kitchen omeprazole (PRILOSEC) 20 MG capsule Take 40 mg by mouth daily.       . pravastatin (PRAVACHOL) 40 MG tablet Take 40 mg by mouth daily.        . sotalol (BETAPACE) 80 MG tablet Take 160 mg by mouth 2 (two) times daily. 80mg , 2 po in the am and 2 po in the pm       . spironolactone (ALDACTONE) 25 MG tablet Take 25 mg by mouth daily.        Marland Kitchen warfarin (COUMADIN) 5 MG tablet Take 5 mg by mouth as directed.          Past Medical History  Diagnosis Date  . Paroxysmal ventricular tachycardia   . HYPERTENSION, UNSPECIFIED   . HYPERLIPIDEMIA-MIXED   . CVA   . CARDIOMYOPATHY, SECONDARY   . ICD - IN SITU     ROS:   All systems reviewed and negative except as noted in the HPI.   Past Surgical History  Procedure Date  . [employer] fortify dr single chamber cardioverter defibrillator.02/09/2009.      Family History  Problem Relation Age of Onset  . Coronary artery disease       History   Social History  . Marital Status: Married    Spouse Name: N/A    Number of Children: N/A  . Years of Education: N/A    Occupational History  . Not on file.   Social History Main Topics  . Smoking status: Never Smoker   . Smokeless tobacco: Not on file  . Alcohol Use: Not on file  . Drug Use: Not on file  . Sexually Active: Not on file   Other Topics Concern  . Not on file   Social History Narrative  . No narrative on file     BP 112/60  Pulse 90  Ht 5\' 7"  (1.702 m)  Wt 82.155 kg (181 lb 1.9 oz)  BMI 28.37 kg/m2  Physical Exam:  Well appearing middle aged man, NAD HEENT: Unremarkable Neck:  No JVD, no thyromegally Lymphatics:  No adenopathy Back:  No CVA tenderness Lungs:  Clear with no wheezes, rales, or rhonchi. Well-healed ICD incision. HEART:  Regular rate rhythm, no murmurs, no rubs, no clicks Abd:  soft, positive bowel sounds, no organomegally, no rebound, no guarding Ext:  2 plus pulses, no edema, no cyanosis, no clubbing Skin:  No rashes no nodules Neuro:  CN II through XII intact, motor grossly intact  DEVICE  Normal device function.  See PaceArt for details.   Assess/Plan:

## 2011-06-14 NOTE — Patient Instructions (Signed)
Your physician recommends that you schedule a follow-up appointment in: 3 months in the device clinic  Your physician wants you to follow-up in: 12 months with Dr Taylor You will receive a reminder letter in the mail two months in advance. If you don't receive a letter, please call our office to schedule the follow-up appointment.   

## 2011-06-14 NOTE — Assessment & Plan Note (Signed)
The patient's device is working normally. We'll plan to recheck in several months. 

## 2011-12-13 ENCOUNTER — Encounter (HOSPITAL_COMMUNITY): Payer: Self-pay

## 2011-12-13 ENCOUNTER — Emergency Department (HOSPITAL_COMMUNITY): Payer: Medicare Other

## 2011-12-13 ENCOUNTER — Emergency Department (HOSPITAL_COMMUNITY)
Admission: EM | Admit: 2011-12-13 | Discharge: 2011-12-13 | Disposition: A | Discharge status: Home / Self Care | Payer: Medicare Other | Attending: Emergency Medicine | Admitting: Emergency Medicine

## 2011-12-13 DIAGNOSIS — R0602 Shortness of breath: Secondary | ICD-10-CM | POA: Insufficient documentation

## 2011-12-13 DIAGNOSIS — Z91199 Patient's noncompliance with other medical treatment and regimen due to unspecified reason: Secondary | ICD-10-CM

## 2011-12-13 DIAGNOSIS — Z79899 Other long term (current) drug therapy: Secondary | ICD-10-CM | POA: Insufficient documentation

## 2011-12-13 DIAGNOSIS — I4729 Other ventricular tachycardia: Secondary | ICD-10-CM | POA: Insufficient documentation

## 2011-12-13 DIAGNOSIS — R0989 Other specified symptoms and signs involving the circulatory and respiratory systems: Secondary | ICD-10-CM | POA: Insufficient documentation

## 2011-12-13 DIAGNOSIS — Z95810 Presence of automatic (implantable) cardiac defibrillator: Secondary | ICD-10-CM | POA: Insufficient documentation

## 2011-12-13 DIAGNOSIS — I509 Heart failure, unspecified: Secondary | ICD-10-CM

## 2011-12-13 DIAGNOSIS — R079 Chest pain, unspecified: Secondary | ICD-10-CM

## 2011-12-13 DIAGNOSIS — I472 Ventricular tachycardia, unspecified: Secondary | ICD-10-CM

## 2011-12-13 DIAGNOSIS — Z9114 Patient's other noncompliance with medication regimen: Secondary | ICD-10-CM

## 2011-12-13 DIAGNOSIS — I10 Essential (primary) hypertension: Secondary | ICD-10-CM | POA: Insufficient documentation

## 2011-12-13 DIAGNOSIS — Z9119 Patient's noncompliance with other medical treatment and regimen: Secondary | ICD-10-CM | POA: Insufficient documentation

## 2011-12-13 DIAGNOSIS — I5023 Acute on chronic systolic (congestive) heart failure: Secondary | ICD-10-CM

## 2011-12-13 DIAGNOSIS — Z794 Long term (current) use of insulin: Secondary | ICD-10-CM | POA: Insufficient documentation

## 2011-12-13 DIAGNOSIS — Z9111 Patient's noncompliance with dietary regimen: Secondary | ICD-10-CM

## 2011-12-13 HISTORY — DX: Other cardiomyopathies: I42.8

## 2011-12-13 HISTORY — DX: Malignant (primary) neoplasm, unspecified: C80.1

## 2011-12-13 HISTORY — DX: Intracardiac thrombosis, not elsewhere classified: I51.3

## 2011-12-13 HISTORY — DX: Major depressive disorder, single episode, unspecified: F32.9

## 2011-12-13 HISTORY — DX: Heart failure, unspecified: I50.9

## 2011-12-13 HISTORY — DX: Splenomegaly, not elsewhere classified: R16.1

## 2011-12-13 HISTORY — DX: Depression, unspecified: F32.A

## 2011-12-13 HISTORY — DX: Intussusception: K56.1

## 2011-12-13 HISTORY — DX: Acute embolism and thrombosis of unspecified vein: I82.90

## 2011-12-13 HISTORY — DX: Unspecified intestinal obstruction, unspecified as to partial versus complete obstruction: K56.609

## 2011-12-13 LAB — BASIC METABOLIC PANEL
BUN: 12 mg/dL (ref 6–23)
CO2: 28 mEq/L (ref 19–32)
GFR calc non Af Amer: >90 mL/min (ref >90)
Glucose, Bld: 271 mg/dL — ABNORMAL HIGH (ref 70–99)
Potassium: 3.9 mEq/L (ref 3.5–5.1)

## 2011-12-13 LAB — DIFFERENTIAL
Basophils Absolute: 0.1 10*3/uL (ref 0.0–0.1)
Basophils Relative: 1 % (ref 0–1)
Neutro Abs: 3.4 10*3/uL (ref 1.7–7.7)
Neutrophils Relative %: 56 % (ref 43–77)

## 2011-12-13 LAB — PROTIME-INR
INR: 1 (ref 0.00–1.49)
Prothrombin Time: 13.4 seconds (ref 11.6–15.2)

## 2011-12-13 LAB — PRO B NATRIURETIC PEPTIDE: Pro B Natriuretic peptide (BNP): 1112 pg/mL — ABNORMAL HIGH (ref 0–125)

## 2011-12-13 LAB — CBC
MCHC: 35.3 g/dL (ref 30.0–36.0)
RDW: 12.8 % (ref 11.5–15.5)

## 2011-12-13 MED ORDER — MAGNESIUM OXIDE 400 MG PO TABS: 400.0000 mg | ORAL_TABLET | Freq: Every day | ORAL | Status: DC

## 2011-12-13 MED ORDER — FUROSEMIDE 10 MG/ML IJ SOLN
40.0000 mg | Freq: Once | INTRAMUSCULAR | Status: AC
  Administered 2011-12-13: 40 mg via INTRAVENOUS
  Filled 2011-12-13: qty 4

## 2011-12-13 MED ORDER — ASPIRIN 81 MG PO CHEW
324.0000 mg | CHEWABLE_TABLET | Freq: Once | ORAL | Status: AC
  Administered 2011-12-13: 324 mg via ORAL
  Filled 2011-12-13: qty 4

## 2011-12-13 MED ORDER — SODIUM CHLORIDE 0.9 % IV SOLN
Freq: Once | INTRAVENOUS | Status: AC
  Administered 2011-12-13: 10:00:00 via INTRAVENOUS

## 2011-12-13 MED ORDER — SPIRONOLACTONE 25 MG PO TABS: 25.0000 mg | ORAL_TABLET | Freq: Every day | ORAL | Status: DC

## 2011-12-13 MED ORDER — DIGOXIN 125 MCG PO TABS: 125.0000 ug | ORAL_TABLET | Freq: Every day | ORAL | Status: DC

## 2011-12-13 MED ORDER — FUROSEMIDE 40 MG PO TABS: 40.0000 mg | ORAL_TABLET | Freq: Every day | ORAL | Status: DC

## 2011-12-13 MED ORDER — PRAVASTATIN SODIUM 40 MG PO TABS: 40.0000 mg | ORAL_TABLET | Freq: Every evening | ORAL | Status: DC

## 2011-12-13 MED ORDER — LISINOPRIL 5 MG PO TABS: 5.0000 mg | ORAL_TABLET | Freq: Every day | ORAL | Status: DC

## 2011-12-13 MED ORDER — CARVEDILOL 3.125 MG PO TABS: 3.1250 mg | ORAL_TABLET | Freq: Two times a day (BID) | ORAL | Status: DC

## 2011-12-13 NOTE — ED Provider Notes (Signed)
Medical screening examination/treatment/procedure(s) were conducted as a shared visit with non-physician practitioner(s) and myself.  I personally evaluated the patient during the encounter   Date: 12/13/2011  Rate: 88  Rhythm: normal sinus rhythm  QRS Axis: left  Intervals: normal  ST/T Wave abnormalities: normal  Conduction Disutrbances:none  Narrative Interpretation:   Old EKG Reviewed: no significant change from previous  RRR, CTAB. C/O PND and DOE, mild. Min atypical chest pain.   Forbes Cellar, MD 12/13/11 1610

## 2011-12-13 NOTE — ED Notes (Signed)
Pt reports several day hx of sob, pt states he has discomfort to his chest when he takes a deep breath. Pt is very vague in all his statements, needed to ask several times for him to describe what has going on. Pt with cardiac hx. Pt states sob has got worse over the last couple days.

## 2011-12-13 NOTE — ED Provider Notes (Signed)
History     CSN: [id number]  Arrival date & time 12/13/11  7829   First MD Initiated Contact with Patient 12/13/11 [phone]      Chief Complaint  Patient presents with  . Shortness of Breath  . Chest Pain    (Consider location/radiation/quality/duration/timing/severity/associated sxs/prior treatment) HPI Comments: Patient is a 51 year old male with a history of ventricular tachycardia, hypertension, hyperlipidemia, CAD and pacemaker/fibular placement by Dr. Ladona Ridgel who presents emergency department with chief complaint of chest pain shortness of breath.  Onset of symptoms began Wednesday afternoon, location is substernal, duration is 5-10 minutes every one to 2 hours, characterized as a midsternal pressure that rattles but does not radiate, severity 1-2/10, worsening with breathing and became exertional today, associated symptoms include shortness of breath and dyspnea on exertion.  Patient denies any syncope, ICD shocks, PND, orthopnea, leg swelling, claudication, palpitations, diaphoresis, nausea, vomiting, abdominal pain, and wheezing, cough, hemoptysis, fevers, night sweats, chills, recent travel.  Note that patient is currently on Coumadin but denies a history of blood clots or stent placement. Patient has no other complaints at this time.   Patient is a 51 y.o. male presenting with shortness of breath and chest pain. The history is provided by the patient.  Shortness of Breath  Associated symptoms include chest pain and shortness of breath. Pertinent negatives include no fever, no stridor, no cough and no wheezing.  Chest Pain Primary symptoms include shortness of breath. Pertinent negatives for primary symptoms include no fever, no fatigue, no cough, no wheezing, no palpitations, no abdominal pain, no nausea, no vomiting and no dizziness.  Pertinent negatives for associated symptoms include no diaphoresis.     Past Medical History  Diagnosis Date  . Paroxysmal ventricular tachycardia     . HYPERTENSION, UNSPECIFIED   . HYPERLIPIDEMIA-MIXED   . CVA   . CARDIOMYOPATHY, SECONDARY   . ICD - IN SITU     Past Surgical History  Procedure Date  . St. jude fortify dr single chamber cardioverter defibrillator.02/09/2009.     Family History  Problem Relation Age of Onset  . Coronary artery disease      History  Substance Use Topics  . Smoking status: Never Smoker   . Smokeless tobacco: Not on file  . Alcohol Use: No      Review of Systems  Constitutional: Negative for fever, chills, diaphoresis, activity change, fatigue and unexpected weight change.  HENT: Negative for congestion, neck pain and neck stiffness.   Eyes: Negative for visual disturbance.  Respiratory: Positive for chest tightness and shortness of breath. Negative for apnea, cough, wheezing and stridor.   Cardiovascular: Positive for chest pain. Negative for palpitations and leg swelling.  Gastrointestinal: Negative for nausea, vomiting, abdominal pain, diarrhea and blood in stool.  Genitourinary: Negative for dysuria, urgency, hematuria and flank pain.  Musculoskeletal: Negative for myalgias, back pain and gait problem.  Skin: Negative for pallor.  Neurological: Negative for dizziness, syncope, light-headedness and headaches.  All other systems reviewed and are negative.    Allergies  Review of patient's allergies indicates no known allergies.  Home Medications   Current Outpatient Rx  Name Route Sig Dispense Refill  . ASPIRIN 81 MG PO TABS Oral Take 81 mg by mouth daily.      Marland Kitchen DIGOXIN 0.125 MG PO TABS Oral Take 125 mcg by mouth daily.      . FUROSEMIDE 40 MG PO TABS Oral Take 40 mg by mouth daily.      . INSULIN GLARGINE  100 UNIT/ML [place] SOLN Subcutaneous Inject 10 Units into the skin daily.    Marland Kitchen MAGNESIUM OXIDE 400 MG PO TABS Oral Take 400 mg by mouth daily.      Marland Kitchen OMEPRAZOLE 20 MG PO CPDR Oral Take 40 mg by mouth daily.     Marland Kitchen PRAVASTATIN SODIUM 40 MG PO TABS Oral Take 40 mg by mouth every  evening.     Marland Kitchen SOTALOL HCL 80 MG PO TABS Oral Take 160 mg by mouth 2 (two) times daily.     Marland Kitchen SPIRONOLACTONE 25 MG PO TABS Oral Take 25 mg by mouth daily.      . WARFARIN SODIUM 5 MG PO TABS Oral Take 7.5-10 mg by mouth daily. 2 tab on Monday and 1.5 tab all other days      BP 123/87  Pulse 97  Temp 97.6 F (36.4 C)  Resp 20  SpO2 98%  Physical Exam  Nursing note and vitals reviewed. Constitutional: He appears well-developed and well-nourished. No distress.  HENT:  Head: Normocephalic and atraumatic.  Eyes: Conjunctivae and EOM are normal. Pupils are equal, round, and reactive to light.  Neck: Normal range of motion. Neck supple. Normal carotid pulses and no JVD present. Carotid bruit is not present. No rigidity. Normal range of motion present.  Cardiovascular: Normal rate, regular rhythm, S1 normal, S2 normal, normal heart sounds, intact distal pulses and normal pulses.  Exam reveals no gallop and no friction rub.   No murmur heard.      No pitting edema bilaterally, RRR, no aberrant sounds on auscultations, distal pulses intact, no carotid bruit or JVD.   Pulmonary/Chest: Effort normal. No accessory muscle usage or stridor. No respiratory distress. He has rales in the right lower field. He exhibits no tenderness and no bony tenderness.  Abdominal: Bowel sounds are normal.       Soft non tender. Non pulsatile aorta.   Skin: Skin is warm, dry and intact. No rash noted. He is not diaphoretic. No cyanosis. Nails show no clubbing.    ED Course  Procedures (including critical care time)  Labs Reviewed  PRO B NATRIURETIC PEPTIDE - Abnormal; Notable for the following:    Pro B Natriuretic peptide (BNP) 1112.0 (*)    All other components within normal limits  CBC - Abnormal; Notable for the following:    HCT 38.8 (*)    All other components within normal limits  DIFFERENTIAL  POCT I-STAT TROPONIN I  APTT  PROTIME-INR   Dg Chest Port 1 View  12/13/2011  *RADIOLOGY REPORT*   Clinical Data: Mid chest pain, shortness of breath  PORTABLE CHEST - 1 VIEW  Comparison: 02/09/2009; 02/25/2008; 05/03/2007  Findings: Apparent enlargement of the cardiac silhouette is likely accentuated due to decreased lung volumes and AP projection. Stable positioning of support apparatus with left anterior chest wall dual lead AICD / pacemaker tips overlying the expected location of the right atrium and ventricle.  Query small bilateral effusions, left greater than right.  Bibasilar opacities, left greater than right.  Pulmonary venous congestion without frank evidence of edema.  No pneumothorax.  Unchanged bones.  IMPRESSION: 1.  Decreased lung volumes with small effusions and bibasilar opacities, left greater than right, atelectasis versus infiltrate. 2.  Apparent increase in the size of the cardiac silhouette is favored to be secondary to decreased lung volumes and projection. Further evaluation with a PA and lateral chest radiograph may be obtained as clinically indicated.  Original Report Authenticated By: Alfredia Ferguson  V, M.D.     No diagnosis found.  9:18 AM  [facility] cardiology to see in ED. Discussed Pt with Dr. Hyman Hopes who will also evaluate pt. (appears stable on re-exam)   MDM  Chest pain, CHF exacerbation   Concern for cardiac etiology of Chest Pain. Cardiology has been consulted and will see patient in the ED for likely admit. Pt does not meet criteria for CP protocol and a further evaluation is recommended. Pt has been re-evaluated prior to consult and VSS, NAD, heart RRR, pain 0/10, lungs CTAB. No acute abnormalities found on EKG and first round of cardiac enzymes negative. This case was discussed with Dr. Hyman Hopes who has seen the patient and agrees with plan to admit. The patient appears reasonably stabilized for admission considering the current resources, flow, and capabilities available in the ED at this time, and I doubt any other [facility] requiring further screening and/or treatment in  the ED prior to admission.          Jaci Carrel, [place] 12/13/11 1019

## 2011-12-13 NOTE — Discharge Instructions (Signed)
Keep your follow up instructions as stated above. See Primary Care for your diabetes follow up. Weigh your self daily to check for fluid over load

## 2011-12-13 NOTE — ED Notes (Signed)
Family at bedside. 

## 2011-12-13 NOTE — Consult Note (Signed)
Consult Note  Patient ID: Andre Tran MRN: [id number], SOB: July 02, 1961 51 y.o. Date of Encounter: 12/13/2011, 12:19 PM  Primary Physician: [facility] with Dr. Loraine Leriche Primary Cardiologist: Dr. Ladona Ridgel  Chief Complaint: dyspnea   HPI: 51 y.o. male w/ PMHx significant for Chronic Systolic CHF 2/2 NonIschemic CM, Paroxysmal VT s/p SJM ICD, LV Thrombus (on coumadin), and CVA '08 who presented to [facility] on 12/13/2011 with complaints of dyspnea after being medically noncompliant for the last 3mos.  He has a h/o severe cardiomyopathy 2/2 Adriamycin administration for lymphoma and PVT/V.Fib arrest requiring ICD implant in 2005.  Had CVA in 2008 with residual right-sided weakness. LV thrombus in 2009 and initiated on coumadin. Recurrent VT in 2009 and initiated on sotalol. Elective replacement with St. Jude's ICD in 2010. Last clinic visit/ICD interrogation 06/2011. He reports his wife usually takes care of all his medications and appointments, but he decided to start doing it himself in December. Has not had medical care or taken any of his medications in the last 3 months. Prior to Wednesday (12/07/11) had been feeling well and able to work out in his gym doing weights and cardio without chest pain or sob. Reports following a low sodium, heart healthy diet. He has had progressive orthopnea and PND since Wednesday of last week. He presented to the ED today because he felt like he had fluid on his chest. He denies chest pain, but describes substernal chest pressure only when he lays flat in bed. Denies cough, but has heard a "rattling" in his chest. No DOE, edema, weight gain, syncope, ICD shocks, weakness, fever, chills, change in bladder or bowels, or prolonged immobility/extended travel.  EKG revealed NSR w/ 1st degree AV Block 88bpm, LAD, QTc 493 no acute ischemic changes. CXR shows small effusions and bibasilar opacities, L>R. Labs are significant for normal poc troponin, INR 1.0, BNP 1112,  WBC 6.0. He is afebrile with stable vital signs and in no distress. He has received 324mg  ASA and 40mg  IV lasix in the ED with good diuresis.   Past Medical History  Diagnosis Date  . Paroxysmal ventricular tachycardia     s/p AICD '05, replaced w/ St. Jude's in 2010  . HYPERTENSION, UNSPECIFIED   . HYPERLIPIDEMIA-MIXED   . 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 Nonischemic cardiomyopathy     2/2 Adriamycin administration for lymphoma  . CHF (congestive heart failure)     EF 15% by echo 2009; s/p St. Jude ICD  . Diabetes mellitus     Type 2  . Chronic kidney disease     hx of acute renal failure  . Cancer      non hodkins lymphoma 1992, Hodgkins 2007  . Depression   . LV (left ventricular) mural thrombus     2009, on coumadin  . Small bowel obstruction     s/p Small bowel resection 2001  . Jejunal intussusception     2008  . 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 Splenic mass     Noted on Abd [place] 2009 w/ recs for f/u CT - not done   2009 - 2D Echocardiogram  SUMMARY: - Overall left ventricular systolic function was severely reduced. Left ventricular ejection fraction was estimated , range being 10 % to 15 %. There was severe diffuse left  ventricular hypokinesis. Doppler parameters were consistent with high left ventricular filling pressure.  - Right ventricular systolic function was mildly reduced. There was the appearance of a catheter or pacing wire in the right ventricle.    Surgical History:  Past Surgical History  Procedure Date  . Cardiac defibrillator placement     2005, replaced w/ St. Jude's 2010  . Vasectomy   . Bowel resection     small bowell 2/2 obstruction 2001     Home Meds: Medication Sig  aspirin 81 MG tablet Take 81 mg by mouth daily.    digoxin (LANOXIN) 0.125 MG tablet Take 125 mcg by mouth daily.      furosemide (LASIX) 40 MG tablet Take 40 mg by mouth daily.    insulin glargine (LANTUS) 100 UNIT/ML injection Inject 10 Units into the skin daily.  magnesium oxide (MAG-OX) 400 MG tablet Take 400 mg by mouth daily.    omeprazole (PRILOSEC) 20 MG capsule Take 40 mg by mouth daily.   pravastatin (PRAVACHOL) 40 MG tablet Take 40 mg by mouth every evening.   sotalol (BETAPACE) 80 MG tablet Take 160 mg by mouth 2 (two) times daily.   spironolactone (ALDACTONE) 25 MG tablet Take 25 mg by mouth daily.    warfarin (COUMADIN) 5 MG tablet Take 7.5-10 mg by mouth daily. 2 tab on Monday and 1.5 tab all other days    Allergies: No Known Allergies  History   Social History  . Marital Status: Married   Occupational History  . Not on file.   Social History Main Topics  . Smoking status: Never Smoker   . Smokeless tobacco: Never Used  . Alcohol Use: No  . Drug Use: No    Family History  Problem Relation Age of Onset  . Other      No known family h/o heart disease  . Diabetes Mother     Review of Systems: General: negative for chills, fever, night sweats or weight changes.  Cardiovascular: As per HPI Dermatological: negative for rash Respiratory: negative for cough or wheezing Urologic: negative for hematuria Abdominal: negative for nausea, vomiting, diarrhea, bright red blood per rectum, melena, or hematemesis Neurologic: negative for visual changes, syncope, or dizziness All other systems reviewed and are otherwise negative except as noted above.  Labs:   Lab Results  Component Value Date   WBC 6.0 12/13/2011   HGB 13.7 12/13/2011   HCT 38.8* 12/13/2011   MCV 88.6 12/13/2011   PLT 246 12/13/2011     12/13/2011 08:00  Pro B Natriuretic peptide (BNP) 1112.0 (H)     12/13/2011 08:12  Troponin i, poc 0.00     12/13/2011 09:21  Prothrombin Time 13.4  INR 1.00    Radiology/Studies:   12/13/2011 - Chest Port 1 View Findings: Apparent enlargement of the cardiac silhouette is likely  accentuated due to decreased lung volumes and AP projection. Stable positioning of support apparatus with left anterior chest wall dual lead AICD / pacemaker tips overlying the expected location of the right atrium and ventricle.  Query small bilateral effusions, left greater than right.  Bibasilar opacities, left greater than right.  Pulmonary venous congestion without frank evidence of edema.  No pneumothorax.  Unchanged bones.  IMPRESSION: 1.  Decreased lung volumes with small effusions and bibasilar opacities, left greater than right, atelectasis versus infiltrate. 2.  Apparent increase in the size of the cardiac silhouette is favored to be secondary to decreased lung volumes and projection. Further evaluation with a  PA and lateral chest radiograph may be obtained as clinically indicated.      EKG: 12/13/11 @ 0734 - NSR w/ 1st degree AV Block 88bpm, LAD, QTc 493, no acute ischemic changes.  Physical Exam: Blood pressure 114/93, pulse 86, temperature 97.6 F (36.4 C), resp. rate 21, SpO2 98.00%. General: Middle-aged black male in no acute distress. Head: Normocephalic, atraumatic, sclera non-icteric, nares are without discharge Neck: Supple. Negative for carotid bruits. JVD elevated. Lungs: Fine bibasilar rales L>R, No wheezes or rhonchi. Breathing is unlabored. Heart: RRR with S1 S2. No murmurs, rubs, or gallops appreciated. Abdomen: Soft, non-tender, non-distended with normoactive bowel sounds. No rebound/guarding. No obvious abdominal masses. Msk:  Strength and tone appear normal for age. Extremities: No edema. No clubbing or cyanosis. Distal pedal pulses are intact and equal bilaterally. Neuro: Alert and oriented X 3. Moves all extremities spontaneously. Psych:  Responds to questions appropriately with a normal affect.    ASSESSMENT AND PLAN:  51 y.o. male w/ PMHx significant for Chronic Systolic CHF 2/2 NonIschemic CM, Paroxysmal VT s/p SJM ICD, LV Thrombus (on coumadin), and CVA '08 who  presented to [facility] on 12/13/2011 with complaints of dyspnea after being medically noncompliant for the last 3mos.  1. Acute on Chronic Systolic CHF 2. NonIschemic Cardiomyopathy 2/2 Adriamycin administration for lymphoma  3. Paroxysmal VT s/p ICD 4. LV thrombus - on coumadin 5. Noncompliance   Signed, HOPE, JESSICA PA-C 12/13/2011, 12:19 PM  Patient seen and examined with [facility]. We discussed all aspects of the encounter. I agree with the assessment and plan as stated above.   Mr. Fiorella has recurrent mild HF in the setting of medication noncompliance. Much improved with IV lasix in ER. Stressed need for medication compliance. Will send him home today with resumption of meds and close f/u in HF clinic with echo and BMET.   Lasix 40 daily Spiro 25 daily Carvedilol 3.125 bid Digoxin 0.125 daily Lisinopril 5 mg daily  I discussed sotalol issue with Dr. Ladona Ridgel by phone. Given non-compliance and prolonged QTC ( with QRS 94 ms) will hold off on re-initiating sotalol for now. He is aware of risk of recurrent VT/VF and ICD firing. Will f/u with Dr. Ladona Ridgel in several weeks to discuss. Will interrogate ICD in ER to make sure he is not having VT episodes. Given noncompliance will hold off on coumadin for now as well. May need to restart if he has persistent LV clot.  Claretta Kendra,MD 12:51 PM

## 2011-12-13 NOTE — ED Notes (Signed)
Pt c/o non radiating mid-sternum chest pain and sob x1 week, pt denies N/V/D, cough, congestion, abd/back pain, or diaphoresis. Pt reports he has not taken any of his daily medications in 3 months. Spouse reports she was not aware of this pt not taking his medication.

## 2011-12-14 ENCOUNTER — Other Ambulatory Visit (HOSPITAL_COMMUNITY): Payer: Self-pay | Admitting: Internal Medicine

## 2011-12-14 DIAGNOSIS — I5022 Chronic systolic (congestive) heart failure: Secondary | ICD-10-CM

## 2011-12-16 ENCOUNTER — Ambulatory Visit (HOSPITAL_COMMUNITY)
Admit: 2011-12-16 | Discharge: 2011-12-16 | Disposition: A | Discharge status: Home / Self Care | Payer: Medicare Other | Attending: Internal Medicine | Admitting: Internal Medicine

## 2011-12-16 ENCOUNTER — Ambulatory Visit (HOSPITAL_BASED_OUTPATIENT_CLINIC_OR_DEPARTMENT_OTHER)
Admit: 2011-12-16 | Discharge: 2011-12-16 | Disposition: A | Discharge status: Home / Self Care | Payer: Medicare Other | Attending: Internal Medicine | Admitting: Internal Medicine

## 2011-12-16 ENCOUNTER — Encounter (HOSPITAL_COMMUNITY): Payer: Self-pay

## 2011-12-16 VITALS — BP 80/60 | HR 71 | Ht 67.0 in | Wt 161.1 lb

## 2011-12-16 DIAGNOSIS — I5022 Chronic systolic (congestive) heart failure: Secondary | ICD-10-CM

## 2011-12-16 DIAGNOSIS — I509 Heart failure, unspecified: Secondary | ICD-10-CM | POA: Insufficient documentation

## 2011-12-16 DIAGNOSIS — I059 Rheumatic mitral valve disease, unspecified: Secondary | ICD-10-CM | POA: Insufficient documentation

## 2011-12-16 DIAGNOSIS — I472 Ventricular tachycardia: Secondary | ICD-10-CM

## 2011-12-16 DIAGNOSIS — Z950 Presence of cardiac pacemaker: Secondary | ICD-10-CM | POA: Insufficient documentation

## 2011-12-16 NOTE — Progress Notes (Signed)
Encounter addended by: Noralee Space, RN on: 12/16/2011 12:17 PM<BR>     Documentation filed: Patient Instructions Section, Orders

## 2011-12-16 NOTE — Progress Notes (Signed)
Encounter addended by: Noralee Space, RN on: 12/16/2011 12:19 PM<BR>     Documentation filed: Patient Instructions Section

## 2011-12-16 NOTE — Assessment & Plan Note (Signed)
Off sotalol for now. Has ICD in place. F/u with Dr. Ladona Ridgel.

## 2011-12-16 NOTE — Progress Notes (Signed)
  Echocardiogram 2D Echocardiogram has been performed.  Andre Tran 12/16/2011, 10:39 AM

## 2011-12-16 NOTE — Patient Instructions (Addendum)
Device Check at [facility] on Thursday 6/13 at 10 am  Labs on Thursday 6/13 at [facility]  Your physician recommends that you schedule a follow-up appointment in: 3-4 weeks

## 2011-12-16 NOTE — Progress Notes (Signed)
Patient ID: NORTH ESTERLINE, male   DOB: 14-Mar-1961, 51 y.o.   MRN: [id number]   Weight Range   Baseline proBNP     HPI:  51 y.o. male w/ PMHx significant for chronic systolic CHF 2/2 NonIschemic CM, Paroxysmal VT s/p SJM ICD, LV Thrombus (on coumadin), and CVA '08 who presented to [facility] on 12/13/2011 with complaints of dyspnea after being medically noncompliant for the last 3mos.   He has a h/o severe cardiomyopathy 2/2 Adriamycin administration for lymphoma and PVT/V.Fib arrest requiring ICD implant in 2005. Had CVA in 2008 with residual right-sided weakness. LV thrombus in 2009 and initiated on coumadin. Recurrent VT in 2009 and initiated on sotalol. Elective replacement with St. Jude's ICD in 2010. Last clinic visit/ICD interrogation 06/2011. He reports his wife usually takes care of all his medications and appointments, but he decided to start doing it himself in December. Has not had medical care or taken any of his medications in the last 3 months.   In ER, EKG revealed NSR w/ 1st degree AV Block 88bpm, LAD, QTc 493 no acute ischemic changes. CXR shows small effusions. He has received 40mg  IV lasix in the ED with good diuresis.  Discharged on:   Lasix 40 daily  Spiro 25 daily  Carvedilol 3.125 bid  Digoxin 0.125 daily  Lisinopril 5 mg daily   I discussed sotalol issue with Dr. Ladona Ridgel. Given non-compliance and prolonged QTC ( with QRS 94 ms) will hold off on re-initiating sotalol for now. Given noncompliance held off on coumadin for now as well.   Had echo this am: EF 15% global HK. No apical thrombus.   Started back on all his meds. Breathing much better. Weight down. No edema. Now weighing himself every morning. BP 80/60. (says that is normal for him on meds) Denies dizziness.     ROS: All systems negative except as listed in HPI, PMH and Problem List.  Past Medical History  Diagnosis Date  . Paroxysmal ventricular tachycardia     s/p AICD '05, replaced w/ St.  Jude's in 2010  . HYPERTENSION, UNSPECIFIED   . HYPERLIPIDEMIA-MIXED   . 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 Nonischemic cardiomyopathy     2/2 Adriamycin administration for lymphoma  . CHF (congestive heart failure)     EF 15% by echo 2009; s/p St. Jude ICD  . Diabetes mellitus     Type 2  . Cancer      non hodkins lymphoma 1992, Hodgkins 2007  . Depression   . LV (left ventricular) mural thrombus     2009, on coumadin  . Small bowel obstruction     s/p Small bowel resection 2001  . Jejunal intussusception     2008  . 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 Splenic mass     Noted on Abd [place] 2009 w/ recs for f/u CT - not done    Current Outpatient Prescriptions  Medication Sig Dispense Refill  . aspirin 81 MG tablet Take 81 mg by mouth daily.        . carvedilol (COREG) 3.125 MG tablet Take 1 tablet (3.125 mg total) by mouth 2 (two) times daily with a meal.  60 tablet  6  . digoxin (LANOXIN) 0.125 MG tablet Take 1 tablet (125 mcg total) by mouth daily.  30 tablet  6  . furosemide (LASIX) 40 MG tablet Take 1 tablet (40 mg total) by mouth daily.  30 tablet  6  . insulin glargine (LANTUS) 100 UNIT/ML injection Inject 10 Units into the skin daily.      Marland Kitchen lisinopril (PRINIVIL) 5 MG tablet Take 1 tablet (5 mg total) by mouth daily.  30 tablet  6  . pravastatin (PRAVACHOL) 40 MG tablet Take 1 tablet (40 mg total) by mouth every evening.  30 tablet  6  . spironolactone (ALDACTONE) 25 MG tablet Take 1 tablet (25 mg total) by mouth daily.  30 tablet  6     PHYSICAL EXAM: Filed Vitals:   12/16/11 1115  BP: 80/60  Pulse: 71  Height: 5\' 7"  (1.702 m)  Weight: 161 lb 1.9 oz (73.084 kg)    General:  Well appearing. No resp difficulty HEENT: normal Neck: supple. JVP flat. Carotids 2+ bilaterally; no bruits. No lymphadenopathy or  thryomegaly appreciated. Cor: PMI normal. Regular rate & rhythm. No rubs, gallops or murmurs. Lungs: clear Abdomen: soft, nontender, nondistended. No hepatosplenomegaly. No bruits or masses. Good bowel sounds. Extremities: no cyanosis, clubbing, rash, edema Neuro: alert & orientedx3, cranial nerves grossly intact. Moves all 4 extremities w/o difficulty. Affect pleasant.     ASSESSMENT & PLAN:

## 2011-12-16 NOTE — Assessment & Plan Note (Signed)
Echo reviewed personally. He has very severe LV dysfunction with EF 15%. Currently NYHA II-III. Volume status looks good. BP soft but asymptomatic. Will continue current regimen. Check labs next week to make sure renal function stable. Currently not candidate for advanced therapies due to noncompliance but if he can demonstrate improved compliance would perform CPX to further risk stratify. I discussed this with him. Long discussion regarding need for daily weights and reviewed use of sliding scale diuretics. Will not restart coumadin at this point but if compliance improves would restart.

## 2011-12-17 ENCOUNTER — Inpatient Hospital Stay (HOSPITAL_COMMUNITY)
Admission: EM | Admit: 2011-12-17 | Discharge: 2011-12-19 | DRG: 312 | Disposition: A | Discharge status: Home / Self Care | Payer: Medicare Other | Attending: Internal Medicine | Admitting: Internal Medicine

## 2011-12-17 ENCOUNTER — Encounter (HOSPITAL_COMMUNITY): Payer: Self-pay | Admitting: Emergency Medicine

## 2011-12-17 ENCOUNTER — Emergency Department (HOSPITAL_COMMUNITY): Payer: Medicare Other

## 2011-12-17 DIAGNOSIS — I509 Heart failure, unspecified: Secondary | ICD-10-CM

## 2011-12-17 DIAGNOSIS — I9589 Other hypotension: Secondary | ICD-10-CM | POA: Diagnosis present

## 2011-12-17 DIAGNOSIS — Z7982 Long term (current) use of aspirin: Secondary | ICD-10-CM

## 2011-12-17 DIAGNOSIS — E782 Mixed hyperlipidemia: Secondary | ICD-10-CM | POA: Diagnosis present

## 2011-12-17 DIAGNOSIS — I472 Ventricular tachycardia, unspecified: Secondary | ICD-10-CM | POA: Diagnosis present

## 2011-12-17 DIAGNOSIS — Z79899 Other long term (current) drug therapy: Secondary | ICD-10-CM

## 2011-12-17 DIAGNOSIS — I428 Other cardiomyopathies: Secondary | ICD-10-CM | POA: Diagnosis present

## 2011-12-17 DIAGNOSIS — Z87898 Personal history of other specified conditions: Secondary | ICD-10-CM

## 2011-12-17 DIAGNOSIS — I959 Hypotension, unspecified: Secondary | ICD-10-CM | POA: Diagnosis present

## 2011-12-17 DIAGNOSIS — Z91148 Patient's other noncompliance with medication regimen for other reason: Secondary | ICD-10-CM

## 2011-12-17 DIAGNOSIS — Z95810 Presence of automatic (implantable) cardiac defibrillator: Secondary | ICD-10-CM | POA: Diagnosis present

## 2011-12-17 DIAGNOSIS — E119 Type 2 diabetes mellitus without complications: Secondary | ICD-10-CM | POA: Diagnosis present

## 2011-12-17 DIAGNOSIS — Z794 Long term (current) use of insulin: Secondary | ICD-10-CM

## 2011-12-17 DIAGNOSIS — Z91199 Patient's noncompliance with other medical treatment and regimen due to unspecified reason: Secondary | ICD-10-CM

## 2011-12-17 DIAGNOSIS — I4729 Other ventricular tachycardia: Secondary | ICD-10-CM | POA: Diagnosis present

## 2011-12-17 DIAGNOSIS — I429 Cardiomyopathy, unspecified: Secondary | ICD-10-CM | POA: Diagnosis present

## 2011-12-17 DIAGNOSIS — Z9111 Patient's noncompliance with dietary regimen: Secondary | ICD-10-CM

## 2011-12-17 DIAGNOSIS — I440 Atrioventricular block, first degree: Secondary | ICD-10-CM | POA: Diagnosis present

## 2011-12-17 DIAGNOSIS — I5022 Chronic systolic (congestive) heart failure: Secondary | ICD-10-CM | POA: Diagnosis present

## 2011-12-17 DIAGNOSIS — R55 Syncope and collapse: Principal | ICD-10-CM | POA: Diagnosis present

## 2011-12-17 DIAGNOSIS — I10 Essential (primary) hypertension: Secondary | ICD-10-CM | POA: Diagnosis present

## 2011-12-17 DIAGNOSIS — Z9119 Patient's noncompliance with other medical treatment and regimen: Secondary | ICD-10-CM

## 2011-12-17 MED ORDER — GLUCAGON HCL (RDNA) 1 MG IJ SOLR
1.0000 mg | Freq: Once | INTRAMUSCULAR | Status: AC
  Administered 2011-12-17: 1 mg via INTRAVENOUS
  Filled 2011-12-17: qty 1

## 2011-12-17 NOTE — ED Provider Notes (Signed)
History     CSN: [id number]  Arrival date & time 12/17/11  2216   First MD Initiated Contact with Patient 12/17/11 2302      Chief Complaint  Patient presents with  . Hypotension    (Consider location/radiation/quality/duration/timing/severity/associated sxs/prior treatment) Patient is a 51 y.o. male presenting with altered mental status. The history is provided by the patient and the spouse. The history is limited by the condition of the patient.  Altered Mental Status This is a new problem. The current episode started 3 to 5 hours ago. The problem occurs constantly. The problem has been gradually improving. Associated symptoms include shortness of breath. Pertinent negatives include no abdominal pain and no headaches. The symptoms are aggravated by nothing. The symptoms are relieved by nothing. He has tried nothing for the symptoms. The treatment provided no relief.    Past Medical History  Diagnosis Date  . Paroxysmal ventricular tachycardia     s/p AICD '05, replaced w/ St. Jude's in 2010  . HYPERTENSION, UNSPECIFIED   . HYPERLIPIDEMIA-MIXED   . 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 Nonischemic cardiomyopathy     2/2 Adriamycin administration for lymphoma  . CHF (congestive heart failure)     EF 15% by echo 2009; s/p St. Jude ICD  . Diabetes mellitus     Type 2  . Cancer      non hodkins lymphoma 1992, Hodgkins 2007  . Depression   . LV (left ventricular) mural thrombus     2009, on coumadin  . Small bowel obstruction     s/p Small bowel resection 2001  . Jejunal intussusception     2008  . 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 Splenic mass     Noted on Abd [place] 2009 w/ recs for f/u CT - not done    Past Surgical History  Procedure Date  . Cardiac defibrillator placement     2005, replaced w/ St. Jude's 2010  .  Vasectomy   . Bowel resection     small bowell 2/2 obstruction 2001  . Pacemaker insertion     Family History  Problem Relation Age of Onset  . Other      No known family h/o heart disease  . Diabetes Mother     History  Substance Use Topics  . Smoking status: Never Smoker   . Smokeless tobacco: Never Used  . Alcohol Use: No      Review of Systems  Respiratory: Positive for chest tightness and shortness of breath.   Gastrointestinal: Negative for abdominal pain.  Neurological: Negative for seizures and headaches.  Psychiatric/Behavioral: Positive for altered mental status.  All other systems reviewed and are negative.    Allergies  Review of patient's allergies indicates no known allergies.  Home Medications   Current Outpatient Rx  Name Route Sig Dispense Refill  . ASPIRIN 81 MG PO TABS Oral Take 81 mg by mouth daily.      Marland Kitchen CARVEDILOL 3.125 MG PO TABS Oral Take 3.125 mg by mouth 2 (two) times daily with a meal.    . DIGOXIN 0.125 MG PO TABS Oral Take 125 mcg by mouth daily.    . FUROSEMIDE 40 MG PO TABS Oral Take 40 mg by mouth daily.    . INSULIN GLARGINE 100 UNIT/ML [place] SOLN Subcutaneous Inject 10  Units into the skin daily.    Marland Kitchen LISINOPRIL 5 MG PO TABS Oral Take 5 mg by mouth daily.    Marland Kitchen SPIRONOLACTONE 25 MG PO TABS Oral Take 25 mg by mouth daily.      BP 76/63  Pulse 74  Temp(Src) 97.6 F (36.4 C) (Oral)  Resp 17  SpO2 97%  Physical Exam  Constitutional: He is oriented to person, place, and time. He appears well-developed and well-nourished. No distress.  HENT:  Head: Normocephalic and atraumatic.  Mouth/Throat: Oropharynx is clear and moist.  Eyes: Conjunctivae are normal. Pupils are equal, round, and reactive to light.  Neck: Normal range of motion. Neck supple.  Cardiovascular: Normal rate and regular rhythm.   Pulmonary/Chest: He has decreased breath sounds.  Abdominal: Soft. Bowel sounds are normal. There is no tenderness. There is no rebound  and no guarding.  Musculoskeletal: Normal range of motion.  Neurological: He is alert and oriented to person, place, and time. He has normal reflexes.  Skin: Skin is warm and dry. He is not diaphoretic.  Psychiatric: He has a normal mood and affect.    ED Course  Procedures (including critical care time)   Labs Reviewed  CBC  DIFFERENTIAL  PRO B NATRIURETIC PEPTIDE   No results found.   No diagnosis found.    MDM   Date: 12/17/2011  Rate: 71  Rhythm: normal sinus rhythm  QRS Axis: normal  Intervals: PR prolonged  ST/T Wave abnormalities: nonspecific ST changes  Conduction Disutrbances:first-degree A-V block   Narrative Interpretation:   Old EKG Reviewed: unchanged     Discussed with cardiology fellow    Jesica Goheen K Arthelia Callicott-Rasch, MD 12/18/11 [phone]

## 2011-12-17 NOTE — ED Notes (Signed)
Per EMS:  Pt stated he has stopped taking all of his medication three months ago because he felt better and restarted them all about three days ago.  Pt is unsure of what medication he is on.

## 2011-12-17 NOTE — ED Notes (Signed)
Per EMS:  Pt reports feeling weak for the past week.  Pt was at a [employer] today and stated he "just did not feel well"

## 2011-12-18 ENCOUNTER — Encounter (HOSPITAL_COMMUNITY): Payer: Self-pay | Admitting: Physician Assistant

## 2011-12-18 DIAGNOSIS — E1165 Type 2 diabetes mellitus with hyperglycemia: Secondary | ICD-10-CM

## 2011-12-18 DIAGNOSIS — I959 Hypotension, unspecified: Secondary | ICD-10-CM

## 2011-12-18 DIAGNOSIS — I429 Cardiomyopathy, unspecified: Secondary | ICD-10-CM

## 2011-12-18 DIAGNOSIS — I509 Heart failure, unspecified: Secondary | ICD-10-CM

## 2011-12-18 DIAGNOSIS — I5022 Chronic systolic (congestive) heart failure: Secondary | ICD-10-CM

## 2011-12-18 DIAGNOSIS — Z95810 Presence of automatic (implantable) cardiac defibrillator: Secondary | ICD-10-CM

## 2011-12-18 DIAGNOSIS — R55 Syncope and collapse: Secondary | ICD-10-CM | POA: Diagnosis present

## 2011-12-18 LAB — CBC
HCT: 38.8 % — ABNORMAL LOW (ref 39.0–52.0)
MCV: 89.8 fL (ref 78.0–100.0)
RBC: 4.32 MIL/uL (ref 4.22–5.81)
RDW: 12.6 % (ref 11.5–15.5)
WBC: 6.2 10*3/uL (ref 4.0–10.5)

## 2011-12-18 LAB — CARDIAC PANEL(CRET KIN+CKTOT+MB+TROPI)
CK, MB: 1.4 ng/mL (ref 0.3–4.0)
Relative Index: INVALID (ref 0.0–2.5)
Relative Index: INVALID (ref 0.0–2.5)
Total CK: 50 U/L (ref 7–232)
Total CK: 32 U/L (ref 7–232)
Total CK: 31 U/L (ref 7–232)

## 2011-12-18 LAB — GLUCOSE, CAPILLARY
Glucose-Capillary: 199 mg/dL — ABNORMAL HIGH (ref 70–99)
Glucose-Capillary: 205 mg/dL — ABNORMAL HIGH (ref 70–99)

## 2011-12-18 LAB — DIFFERENTIAL
Eosinophils Relative: 3 % (ref 0–5)
Lymphocytes Relative: 25 % (ref 12–46)
Lymphs Abs: 1.5 10*3/uL (ref 0.7–4.0)
Monocytes Absolute: 0.4 10*3/uL (ref 0.1–1.0)

## 2011-12-18 LAB — POCT I-STAT, CHEM 8
BUN: 27 mg/dL — ABNORMAL HIGH (ref 6–23)
Calcium, Ion: 1.17 mmol/L (ref 1.12–1.32)
Chloride: 102 mEq/L (ref 96–112)
Glucose, Bld: 237 mg/dL — ABNORMAL HIGH (ref 70–99)

## 2011-12-18 LAB — URINALYSIS, ROUTINE W REFLEX MICROSCOPIC
Glucose, UA: 250 mg/dL — AB
Hgb urine dipstick: NEGATIVE
Specific Gravity, Urine: 1.014 (ref 1.005–1.030)

## 2011-12-18 MED ORDER — SODIUM CHLORIDE 0.9 % IJ SOLN: 3.0000 mL | INTRAMUSCULAR | Status: DC | PRN

## 2011-12-18 MED ORDER — INSULIN GLARGINE 100 UNIT/ML ~~LOC~~ SOLN: 10.0000 [IU] | Freq: Every day | SUBCUTANEOUS | Status: DC

## 2011-12-18 MED ORDER — INSULIN ASPART 100 UNIT/ML ~~LOC~~ SOLN
0.0000 [IU] | Freq: Three times a day (TID) | SUBCUTANEOUS | Status: DC
  Administered 2011-12-18 – 2011-12-19 (×2): 5 [IU] via SUBCUTANEOUS
  Administered 2011-12-19: 2 [IU] via SUBCUTANEOUS

## 2011-12-18 MED ORDER — ASPIRIN 81 MG PO CHEW
81.0000 mg | CHEWABLE_TABLET | Freq: Every day | ORAL | Status: DC
  Administered 2011-12-18 – 2011-12-19 (×2): 81 mg via ORAL
  Filled 2011-12-18 (×2): qty 1

## 2011-12-18 MED ORDER — SODIUM CHLORIDE 0.9 % IJ SOLN
3.0000 mL | Freq: Two times a day (BID) | INTRAMUSCULAR | Status: DC
  Administered 2011-12-18 – 2011-12-19 (×2): 3 mL via INTRAVENOUS

## 2011-12-18 MED ORDER — CARVEDILOL 3.125 MG PO TABS
3.1250 mg | ORAL_TABLET | Freq: Two times a day (BID) | ORAL | Status: DC
  Administered 2011-12-19: 3.125 mg via ORAL
  Filled 2011-12-18 (×5): qty 1

## 2011-12-18 MED ORDER — SODIUM CHLORIDE 0.9 % IJ SOLN
3.0000 mL | Freq: Two times a day (BID) | INTRAMUSCULAR | Status: DC
  Administered 2011-12-18 (×2): 3 mL via INTRAVENOUS

## 2011-12-18 MED ORDER — SODIUM CHLORIDE 0.9 % IV SOLN: 250.0000 mL | INTRAVENOUS | Status: DC | PRN

## 2011-12-18 MED ORDER — FUROSEMIDE 10 MG/ML IJ SOLN
40.0000 mg | Freq: Once | INTRAMUSCULAR | Status: AC
  Administered 2011-12-18: 40 mg via INTRAVENOUS
  Filled 2011-12-18: qty 4

## 2011-12-18 NOTE — Consult Note (Signed)
CARDIOLOGY CONSULT NOTE  Patient ID: Andre Tran, MRN: [id number], DOB/AGE: 49-Jun-1962 49 y.o. Admit date: 12/17/2011   Date of Consult: 12/18/2011 Primary Physician: Dr. Arta Bruce at [facility] Primary Cardiologist: Dr. Gala Romney (CHF); Dr. Ladona Ridgel  Chief Complaint: tired, listless  HPI: 51 y.o. male w/ PMHx significant for CHF secondary to NICM (adriamycin for lymphoma), paroxysmal VT s/p SJM ICD, prior LV thrombus presented to [facility] 12/16/11 with complaints of listlessness and "slumping over" in a restaurant booth, although without any overt loss of consciousness.  He notes some dizziness of late, but dyspnea and orthopnea have been his principal symptoms. He was advised by Dr. Gala Romney that he will not be a candidate for heart transplantation it is not compliant with medical care.  Just a few days ago, he was seen in the ER by [place] 12/13/11 for complaints of dyspnea after being medically noncompliant for the last 3 months. EKG showed NSR 1st degree AVB, no ischemic changes. INR was 1. He received a dose of IV Lasix in the ER with improvement and was sent him with resumption of home meds, except for sotalol given prolonged QTc ( ). ICD did not show any arrhythmias. He was seen in the CHF clinic 12/16/11 by Dr. Gala Romney; BP noted 80/60 at that time which the patient reported was normal for himself on those meds. He was not felt to be a candidate for advanced therapies at this time due to noncompliance. Volume status looked good. He returned 12/17/11 to [facility] with fatigue and listlessness. Yesterday his family was going to go out for a movie, but [name] did not feel well so they stopped at a [place]. There, his wife noted he was sort of leaning over in the booth with his eyes closed, but was arousable when called. He woke up and reassured her "I'm fine" but after ordering food and not eating it, he leaned over again to rest. His concerned wife requested their food to go, and on the way  out to the parking lot, he made it to the bench before he sat down and asked his wife to call 911. He has a hard time describing how he felt, but says he felt "bad", weak and tired. No CP or SOB. EMS was called and per report his HR was in the 50s and SBP was 70. He did not report his ICD firing. He reported compliance with his lasix, coreg, lisinopril and digoxin over the past 3 days but had not yet started his spironolactone. His wife says he had an appointment coming up on 6/11 to discuss slow further reintroduction of meds with his primary care doctor.  Last night he was given glucagon 1mg  in the ER (per nursing to help increase his blood pressure) which made him feel acutely worse at first, but shortly after he began feeling better. However, overnight he developed orthopnea and SOB so was given a dose of IV Lasix with marked improvement in symptoms. He now feels like he no longer has fluid on board. No LEE, orthopnea, PND, CP. He has been able to walk the halls and is feeling better, but BP still drops from 89/57 lying to 70/39 standing. CE's are negative BMET remarkable for BUN 27 (normal Cr). Glucose high in 200s. pBNP 1897. CXR showed pulm vasc congestion and suspected mild perihilar edema, L basilar opacity possibly atx.  Past Medical History  Diagnosis Date  . Paroxysmal ventricular tachycardia     s/p AICD '05, replaced  w/ St. Jude's in 2010 (PVT/V.Fib arrest requiring ICD implant in 2005)  . HYPERTENSION, UNSPECIFIED   . HYPERLIPIDEMIA-MIXED   . 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 Nonischemic cardiomyopathy     2/2 Adriamycin administration for lymphoma  . CHF (congestive heart failure)     EF 15% by echo 2009; s/p St. Jude ICD  . Diabetes mellitus     Type 2  . Cancer      non hodkins lymphoma 1992, Hodgkins 2007  . Depression   . LV (left ventricular) mural thrombus       2009, on coumadin  . Small bowel obstruction     s/p Small bowel resection 2001  . Jejunal intussusception     2008  . 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 Splenic mass     Noted on Abd [place] 2009 w/ recs for f/u CT - not done      Most Recent Cardiac Studies: 12/16/11 2D Echo Study Conclusions *MOST RECENT* - Left ventricle: LVEF is severely depressed at approximately 20% with diffuse hypokinesis. - Mitral valve: Mild regurgitation.  - Pulmonary arteries: PA peak pressure: 35mm Hg (S).  2D Echo 02/2008 SUMMARY - Overall left ventricular systolic function was severely reduced. Left ventricular ejection fraction was estimated , range being 10 % to 15 %. There was severe diffuse left ventricular hypokinesis. Doppler parameters were consistent with high left ventricular filling pressure. - Right ventricular systolic function was mildly reduced. There was the appearance of a catheter or pacing wire in the right ventricle.   Surgical History:  Past Surgical History  Procedure Date  . Cardiac defibrillator placement     2005, replaced w/ St. Jude's 2010  . Vasectomy   . Bowel resection     small bowell 2/2 obstruction 2001  . Pacemaker insertion      Home Meds: Prior to Admission medications   Medication Sig Start Date End Date Taking? Authorizing Provider  aspirin 81 MG tablet Take 81 mg by mouth daily.     Yes Historical Provider, MD  carvedilol (COREG) 3.125 MG tablet Take 3.125 mg by mouth 2 (two) times daily with a meal. 12/13/11 12/12/12 Yes Jessica A Hope, PA-C  digoxin (LANOXIN) 0.125 MG tablet Take 125 mcg by mouth daily. 12/13/11  Yes Jessica A Hope, PA-C  furosemide (LASIX) 40 MG tablet Take 40 mg by mouth daily. 12/13/11  Yes Jessica A Hope, PA-C  insulin glargine (LANTUS) 100 UNIT/ML injection Inject 10 Units into the skin daily.   Yes Historical Provider, MD  lisinopril (PRINIVIL,ZESTRIL) 5 MG tablet Take 5 mg by mouth daily. 12/13/11 12/12/12  Yes Jessica A Hope, PA-C  spironolactone (ALDACTONE) 25 MG tablet Take 25 mg by mouth daily. 12/13/11   [place]adlupe Spanish Hope, PA-C   Inpatient Medications:    . aspirin  81 mg Oral Daily  . carvedilol  3.125 mg Oral BID WC  . furosemide  40 mg Intravenous Once  . glucagon  1 mg Intravenous Once  . insulin aspart  0-9 Units Subcutaneous TID WC  . insulin glargine  10 Units Subcutaneous Daily  . sodium chloride  3 mL Intravenous Q12H  . sodium chloride  3 mL Intravenous Q12H   Allergies: No Known Allergies  History   Social History  . Marital Status: Married  Spouse Name: N/A    Number of Children: N/A  . Years of Education: N/A   Occupational History  . Not on file.   Social History Main Topics  . Smoking status: Never Smoker   . Smokeless tobacco: Never Used  . Alcohol Use: No  . Drug Use: No  . Sexually Active: Not on file   Other Topics Concern  . Not on file   Social History Narrative  . No narrative on file    Family History  Problem Relation Age of Onset  . Other      No known family h/o heart disease  . Diabetes Mother     Review of Systems: General: negative for chills, fever, night sweats or weight changes.  Cardiovascular: see above Dermatological: negative for rash Respiratory: negative for cough or wheezing Urologic: negative for hematuria Abdominal: had n/v after glucagon. no diarrhea, bright red blood per rectum, melena, or hematemesis Neurologic: negative for visual changes. +weak but not dizzy yesterday. See above All other systems reviewed and are otherwise negative except as noted above.  Labs:  Basename 12/18/11 1200 12/18/11 0600  CKTOTAL 32 50  CKMB 1.3 1.4  TROPONINI <0.30 <0.30   Lab Results  Component Value Date   WBC 6.2 12/17/2011   HGB 15.0 12/18/2011   HCT 44.0 12/18/2011   MCV 89.8 12/17/2011   PLT 217 12/17/2011    Lab 12/18/11 0004 12/13/11 1250  NA 135 --  K 4.2 --  CL 102 --  CO2 -- 28  BUN 27* --  CREATININE 1.10 --    CALCIUM -- 9.6  PROT -- --  BILITOT -- --  ALKPHOS -- --  ALT -- --  AST -- --  GLUCOSE 237* --   Radiology/Studies:    IMPRESSION: Cardiomegaly with pulmonary vascular congestion and suspected mild perihilar edema.  Left basilar opacity, possibly atelectasis.   EKG: NSR 71bpm, prior inferior infarct, TWI I, avL. QTc still somewhat prolonged at 496. Not significantly changed from several days ago, 12/13/11.  Physical Exam: Blood pressure 93/61, pulse 76, temperature 98 F (36.7 C), temperature source Oral, resp. rate 14, height 5\' 7"  (1.702 m), weight 160 lb 0.9 oz (72.6 kg), SpO2 99.00%. Blood pressure 80/60 sitting; no significant change after standing for 1 minute General: Well developed thin AAM no acute distress. Head: Normocephalic, atraumatic, sclera non-icteric, no xanthomas, nares are without discharge.  Neck: Negative for carotid bruits. JVD not elevated. Lungs: Clear bilaterally to auscultation without wheezes, rales, or rhonchi. Breathing is unlabored. Heart: RRR with S1 S2. No murmurs, rubs; S4 present Abdomen: Soft, non-tender, non-distended with normoactive bowel sounds. No hepatomegaly. No rebound/guarding. No obvious abdominal masses. Msk:  Strength and tone appear normal for age. Extremities: No clubbing or cyanosis. No edema.  Distal pedal pulses are 2+ and equal bilaterally. Neuro: Alert and oriented X 3. Moves all extremities spontaneously. Psych:  Responds to questions appropriately with a normal affect.   Assessment and Plan:   1. Fatigue/listlessness in the setting of hypotension - see beow 2. H/o NICM/chronic systolic CHF - currently euvolemic 3. Recent resumption of medicine after noncompliance 4. H/o LV thrombus - had not been taking coumadin as he was supposed to at home, so it was discontinued at consult 12/13/11. No evidence of clot mentioned by 12/16/11 echo  See below for comprehensive thoughts. The patient is currently feeling much better, is  asymptomatic and BP has improved. He still has a component of orthostasis but we do not  feel he needs to stay in the hospital for this. Per Dr. Marvel Plan recommendations, resume lisinopril 2.5mg  daily, digoxin, Lasix 20mg  daily, and keep him off Coreg and Spironolactone. We will arrange f/u in the CHF clinic in the next upcoming days.  Dayna Dunn PA-C 12/18/2011, 1:56 PM   [name]  51 y.o.  male  Cardiology Attending Patient interviewed and examined. Discussed with Dayna Dunn PA-C.  Above note annotated and modified based upon my findings.  Patient has relative hypotension, but the extent to which he is experiencing symptoms as a result of that is uncertain. He clearly has had congestive symptoms and is improved following initial dosing with furosemide. We will monitor weight at home, initially on a dose of 20 mg of furosemide a day, which he should increase to 40 mg if weight increases by a few pounds and continue to take 40 mg until weight is back to baseline. He will not tolerate maximal medications for treatment of nonischemic cardiomyopathy. For now, carvedilol and spironolactone will be held and lisinopril decreased to 2.5 mg per day. Digoxin will be continued. Difficulty in maintaining adequate medical therapy as the result of hypotension may prompt earlier consideration of device therapy or transplant. AICD should be interrogated to verify that pacing is absent or minimal. Syncope does not appear to be imminent, and patient should be able to be discharged today on a decreased medical regime safely. We will arrange followup within the next few days in CHF clinic.  [place] Bing, MD 12/18/2011, 2:57 PM

## 2011-12-18 NOTE — H&P (Signed)
Chief Complaint:  Passed out  HPI: 51 yo male with CM EF 10% s/p aicd, iddm, htn, medical noncompliance comes in with mult syncopal episodes this evening while trying to go out with family members to see a movie.  EMS was called and his HR was in 50's and his sbp was 70.  He denies any cp, sob, le edema, fevers, n, v, d.  Normal state of health prior to this.  Did not feel icd firing.  Was just seen by ED this past week after being OFF all bp meds for over 3 months and cardiology restarted all his meds in the last 3 days or so including lasix, coreg, lisinopril, digoxin not taking the spirinolactone yet.  Review of Systems:  Otherwise negative  Past Medical History: Past Medical History  Diagnosis Date  . Paroxysmal ventricular tachycardia     s/p AICD '05, replaced w/ St. Jude's in 2010  . HYPERTENSION, UNSPECIFIED   . HYPERLIPIDEMIA-MIXED   . 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 Nonischemic cardiomyopathy     2/2 Adriamycin administration for lymphoma  . CHF (congestive heart failure)     EF 15% by echo 2009; s/p St. Jude ICD  . Diabetes mellitus     Type 2  . Cancer      non hodkins lymphoma 1992, Hodgkins 2007  . Depression   . LV (left ventricular) mural thrombus     2009, on coumadin  . Small bowel obstruction     s/p Small bowel resection 2001  . Jejunal intussusception     2008  . 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 Splenic mass     Noted on Abd [place] 2009 w/ recs for f/u CT - not done   Past Surgical History  Procedure Date  . Cardiac defibrillator placement     2005, replaced w/ St. Jude's 2010  . Vasectomy   . Bowel resection     small bowell 2/2 obstruction 2001  . Pacemaker insertion     Medications: Prior to Admission medications   Medication Sig Start Date End Date Taking? Authorizing Provider  aspirin  81 MG tablet Take 81 mg by mouth daily.     Yes Historical Provider, MD  carvedilol (COREG) 3.125 MG tablet Take 3.125 mg by mouth 2 (two) times daily with a meal. 12/13/11 12/12/12 Yes Jessica A Hope, PA-C  digoxin (LANOXIN) 0.125 MG tablet Take 125 mcg by mouth daily. 12/13/11  Yes Jessica A Hope, PA-C  furosemide (LASIX) 40 MG tablet Take 40 mg by mouth daily. 12/13/11  Yes Jessica A Hope, PA-C  insulin glargine (LANTUS) 100 UNIT/ML injection Inject 10 Units into the skin daily.   Yes Historical Provider, MD  lisinopril (PRINIVIL,ZESTRIL) 5 MG tablet Take 5 mg by mouth daily. 12/13/11 12/12/12 Yes Jessica A Hope, PA-C  spironolactone (ALDACTONE) 25 MG tablet Take 25 mg by mouth daily. 12/13/11   Jessica A Hope, PA-C    Allergies:  No Known Allergies  Social History:  reports that he has never smoked. He has never used smokeless tobacco. He reports that he does not drink alcohol or use illicit drugs.  Family History: Family History  Problem Relation Age of Onset  . Other      No known family h/o heart disease  . Diabetes Mother  Physical Exam: Filed Vitals:   12/17/11 2330 12/18/11 0100 12/18/11 0115 12/18/11 0239  BP: 101/68 94/74 96/72  107/80  Pulse: 73 76 78 82  Temp:      TempSrc:      Resp: 15 25 25 18   SpO2: 96% 95% 94% 94%   General appearance: alert, cooperative and no distress Lungs: clear to auscultation bilaterally Heart: regular rate and rhythm, S1, S2 normal, no murmur, click, rub or gallop Abdomen: soft, non-tender; bowel sounds normal; no masses,  no organomegaly Extremities: extremities normal, atraumatic, no cyanosis or edema Pulses: 2+ and symmetric Skin: Skin color, texture, turgor normal. No rashes or lesions Neurologic: Grossly normal  Labs on Admission:   [facility] 12/18/11 0004  NA 135  K 4.2  CL 102  CO2 --  GLUCOSE 237*  BUN 27*  CREATININE 1.10  CALCIUM --  MG --  PHOS --    Basename 12/18/11 0004 12/17/11 2336  WBC -- 6.2  NEUTROABS -- 4.1    HGB 15.0 13.3  HCT 44.0 38.8*  MCV -- 89.8  PLT -- 217    Radiological Exams on Admission: Dg Chest Port 1 View  12/18/2011  *RADIOLOGY REPORT*  Clinical Data: Shortness of breath, CHF  PORTABLE CHEST - 1 VIEW  Comparison: 12/13/2011  Findings: Cardiomegaly with pulmonary vascular congestion and suspected mild perihilar edema.  Left basilar opacity, possibly atelectasis.  Left subclavian ICD.  IMPRESSION: Cardiomegaly with pulmonary vascular congestion and suspected mild perihilar edema.  Left basilar opacity, possibly atelectasis.  Consider PA/lateral chest radiographs, as clinically warranted.  Original Report Authenticated By: Charline Bills, M.D.   Dg Chest Port 1 View  12/13/2011  *RADIOLOGY REPORT*  Clinical Data: Mid chest pain, shortness of breath  PORTABLE CHEST - 1 VIEW  Comparison: 02/09/2009; 02/25/2008; 05/03/2007  Findings: Apparent enlargement of the cardiac silhouette is likely accentuated due to decreased lung volumes and AP projection. Stable positioning of support apparatus with left anterior chest wall dual lead AICD / pacemaker tips overlying the expected location of the right atrium and ventricle.  Query small bilateral effusions, left greater than right.  Bibasilar opacities, left greater than right.  Pulmonary venous congestion without frank evidence of edema.  No pneumothorax.  Unchanged bones.  IMPRESSION: 1.  Decreased lung volumes with small effusions and bibasilar opacities, left greater than right, atelectasis versus infiltrate. 2.  Apparent increase in the size of the cardiac silhouette is favored to be secondary to decreased lung volumes and projection. Further evaluation with a PA and lateral chest radiograph may be obtained as clinically indicated.  Original Report Authenticated By: Waynard Reeds, M.D.    Assessment/Plan Present on Admission:  51 yo male with syncopal episode h/o severe CM with EF 10% s/p aicd recently started back on multiple cardiac meds after  being noncompliant with meds for over 3 months .CARDIOMYOPATHY, SECONDARY .ICD - IN SITU .Syncope .Hypotension .Paroxysmal ventricular tachycardia .Chronic systolic heart failure  Cards consulted to see if need interrogation of aicd and medication recs.  Denies any firing.  Hold all bp meds and diuretics at this time.  bp improved in ED.  Hold off on any ivf unless bp drops again due to severity of his cardiomyopathy.  Likely need to reinstitute bp meds at lower doses.  Donavan Kerlin [phone] 12/18/2011, 3:03 AM

## 2011-12-18 NOTE — Progress Notes (Signed)
Pt's BS 205 and refused lantus this am.  Dr Jomarie Longs notified and instructed that she will change lantus to bed time.  Per Pattricia Boss nurse who received call from MD.

## 2011-12-18 NOTE — Progress Notes (Signed)
Pt seen and examined, admitted earlier this am with multiple syncopal events, SBP of 70, agree with A/P as noted by Dr.David Improved now, feels better now Gently restart coreg, D/w Dr.Brackbill Check orthostatics, depending on that may slowly resume low dose ACE or diuretic, and will stagger meds Monitor on tele Have Dr.Bensimhon see in am  Zannie Cove, MD [phone]

## 2011-12-18 NOTE — Progress Notes (Signed)
Pt BP 89/57 laying down and 70/39.  Dr Jomarie Longs notified and instructed is ok to hold coreg.  Andre Pea, RN  Will cont. To monitor.  Andre Pea, RN>

## 2011-12-19 DIAGNOSIS — E1165 Type 2 diabetes mellitus with hyperglycemia: Secondary | ICD-10-CM

## 2011-12-19 DIAGNOSIS — R55 Syncope and collapse: Secondary | ICD-10-CM

## 2011-12-19 DIAGNOSIS — I5022 Chronic systolic (congestive) heart failure: Secondary | ICD-10-CM

## 2011-12-19 DIAGNOSIS — I959 Hypotension, unspecified: Secondary | ICD-10-CM

## 2011-12-19 LAB — URINE CULTURE: Colony Count: 4000

## 2011-12-19 MED ORDER — FUROSEMIDE 40 MG PO TABS: 20.0000 mg | ORAL_TABLET | ORAL | Status: DC

## 2011-12-19 MED ORDER — LISINOPRIL 2.5 MG PO TABS
2.5000 mg | ORAL_TABLET | Freq: Every day | ORAL | Status: DC
  Filled 2011-12-19: qty 1

## 2011-12-19 MED ORDER — CARVEDILOL 3.125 MG PO TABS
3.1250 mg | ORAL_TABLET | Freq: Two times a day (BID) | ORAL | Status: DC
  Filled 2011-12-19 (×2): qty 1

## 2011-12-19 MED ORDER — DIGOXIN 125 MCG PO TABS
0.1250 mg | ORAL_TABLET | Freq: Every day | ORAL | Status: DC
  Administered 2011-12-19: 0.125 mg via ORAL
  Filled 2011-12-19 (×2): qty 1

## 2011-12-19 MED ORDER — FUROSEMIDE 20 MG PO TABS
20.0000 mg | ORAL_TABLET | Freq: Every day | ORAL | Status: DC
  Administered 2011-12-19: 20 mg via ORAL
  Filled 2011-12-19 (×2): qty 1

## 2011-12-19 NOTE — Progress Notes (Signed)
Utilization Review Completed.Jadin Creque T6/04/2012   

## 2011-12-19 NOTE — Progress Notes (Signed)
Pt BP 80/54 HR is 80. Pt asymptomatic. Dr. Joya Martyr notified. Gave order to hold lisinopril for now and to recheck BP in an hour. Will continue to monitor.

## 2011-12-19 NOTE — Progress Notes (Signed)
Advanced Heart Failure Rounding Note   Subjective:    51 y.o. male w/ PMHx significant for chronic systolic CHF 2/2 NonIschemic CM, Paroxysmal VT s/p SJM ICD, LV Thrombus (on coumadin), and CVA '08 who presented to [facility] on 12/13/2011 with complaints of dyspnea after being medically noncompliant for the last 3mos.   Admitted 6/9 with hypotension and multiple syncopal episodes.  SBP 70s  Currently on low dose carvedilol.  No lisinopril, spiro.  Ambulating in halls without difficulty.  No further episodes of syncope.    Objective:   Weight Range:  Vital Signs:   Temp:  [97.8 F (36.6 C)-98.6 F (37 C)] 97.8 F (36.6 C) (06/10 0500) Pulse Rate:  [76-80] 79  (06/10 1216) Resp:  [14-22] 22  (06/10 0500) BP: (80-97)/(53-63) 95/63 mmHg (06/10 1216) SpO2:  [97 %-100 %] 99 % (06/10 1216) Weight:  [72.3 kg (159 lb 6.3 oz)] 72.3 kg (159 lb 6.3 oz) (06/10 0500) Last BM Date: 12/18/11  Weight change: Filed Weights   12/18/11 0417 12/19/11 0500  Weight: 72.6 kg (160 lb 0.9 oz) 72.3 kg (159 lb 6.3 oz)    Intake/Output:   Intake/Output Summary (Last 24 hours) at 12/19/11 1303 Last data filed at 12/19/11 1108  Gross per 24 hour  Intake    843 ml  Output   1320 ml  Net   -477 ml     Physical Exam: General:  Well appearing. No resp difficulty HEENT: normal. Mild facial droop.  Neck: supple. JVP flat . Carotids 2+ bilat; no bruits. No lymphadenopathy or thryomegaly appreciated. Cor: PMI laterally displaced. Regular rate & rhythm. No rubs, gallops or murmurs. Lungs: clear Abdomen: soft, nontender, nondistended. No hepatosplenomegaly. No bruits or masses. Good bowel sounds. Extremities: no cyanosis, clubbing, rash, edema Neuro: alert & orientedx3, moves all 4 extremities w/o difficulty. Affect pleasant  Telemetry: SR 70-80s  Labs: Basic Metabolic Panel:  Lab 12/18/11 4782 12/13/11 1250  NA 135 139  K 4.2 3.9  CL 102 100  CO2 -- 28  GLUCOSE 237* 271*  BUN 27* 12    CREATININE 1.10 0.76  CALCIUM -- 9.6  MG -- --  PHOS -- --    Liver Function Tests: No results found for this basename: AST:5,ALT:5,ALKPHOS:5,BILITOT:5,PROT:5,ALBUMIN:5 in the last 168 hours No results found for this basename: LIPASE:5,AMYLASE:5 in the last 168 hours No results found for this basename: AMMONIA:3 in the last 168 hours  CBC:  Lab 12/18/11 0004 12/17/11 2336 12/13/11 0800  WBC -- 6.2 6.0  NEUTROABS -- 4.1 3.4  HGB 15.0 13.3 13.7  HCT 44.0 38.8* 38.8*  MCV -- 89.8 88.6  PLT -- 217 246    Cardiac Enzymes:  Lab 12/18/11 2000 12/18/11 1200 12/18/11 0600  CKTOTAL 31 32 50  CKMB 1.4 1.3 1.4  CKMBINDEX -- -- --  TROPONINI <0.30 <0.30 <0.30    BNP: BNP (last 3 results)  Basename 12/17/11 2329 12/13/11 0800  PROBNP 1897.0* 1112.0*      Imaging: Dg Chest Port 1 View  12/18/2011  *RADIOLOGY REPORT*  Clinical Data: Shortness of breath, CHF  PORTABLE CHEST - 1 VIEW  Comparison: 12/13/2011  Findings: Cardiomegaly with pulmonary vascular congestion and suspected mild perihilar edema.  Left basilar opacity, possibly atelectasis.  Left subclavian ICD.  IMPRESSION: Cardiomegaly with pulmonary vascular congestion and suspected mild perihilar edema.  Left basilar opacity, possibly atelectasis.  Consider PA/lateral chest radiographs, as clinically warranted.  Original Report Authenticated By: Charline Bills, M.D.  Medications:     Scheduled Medications:    . aspirin  81 mg Oral Daily  . carvedilol  3.125 mg Oral BID WC  . digoxin  0.125 mg Oral Daily  . furosemide  20 mg Oral Daily  . insulin aspart  0-9 Units Subcutaneous TID WC  . insulin glargine  10 Units Subcutaneous Daily  . sodium chloride  3 mL Intravenous Q12H  . sodium chloride  3 mL Intravenous Q12H  . DISCONTD: carvedilol  3.125 mg Oral BID WC  . DISCONTD: lisinopril  2.5 mg Oral Daily    Infusions:    PRN Medications: sodium chloride, sodium chloride   Assessment:   1) Syncope,  related to #2 2) Hypotension 3) NICM, EF 10-15% 4) Chronic systolic heart failure 5) Noncompliance  Plan/Discussion:    He is improved but BP remains tenuous. Ambulating halls without difficulty, Finding correct diuretic dose will be a challenge. Will start will lasix 20mg  every other day. Agree with carvedilol 3.125 bid. Digoxin 0.125 daily. Will hold ACE-I/ARB for now due to hypotension. Hopefully we can restart soon in clinic. Will see back on Thursday. D/w Dr. Jomarie Longs.   I suspect he will need advanced therapies at some point soon but currently not a good candidate due to compliance issues. If we can find a way to improve this.  Andre Rothenberger,MD 1:06 PM  Length of Stay: 2

## 2011-12-19 NOTE — Progress Notes (Signed)
Pt given dc instructions and verbalized understanding.  Pt dc home via wc.

## 2011-12-19 NOTE — Discharge Summary (Signed)
Physician Discharge Summary  Patient ID: Andre Tran MRN: [id number] DOB/AGE: 1960-08-19 51 y.o.  Admit date: 12/17/2011 Discharge date: 12/19/2011  Primary Care Physician:  Sheila Oats, MD, MD CArdiologist: [facility]  Discharge Diagnoses:   1. Syncope 2. Chronic hypotension 3. NICM EF of 10-15% 4. Paroxysmal ventricular tachycardia 5. ICD - IN SITU 6. Noncompliance with diet and medication regimen 7. DM 8. H/o LV thrombus 9. H/o Lymphoma   Medication List  As of 12/19/2011 10:19 AM   STOP taking these medications         lisinopril 5 MG tablet      spironolactone 25 MG tablet         TAKE these medications         aspirin 81 MG tablet   Take 81 mg by mouth daily.      carvedilol 3.125 MG tablet   Commonly known as: COREG   Take 3.125 mg by mouth 2 (two) times daily with a meal.      digoxin 0.125 MG tablet   Commonly known as: LANOXIN   Take 125 mcg by mouth daily.      furosemide 40 MG tablet   Commonly known as: LASIX   Take 0.5 tablets (20 mg total) by mouth every other day.      insulin glargine 100 UNIT/ML injection   Commonly known as: LANTUS   Inject 10 Units into the skin daily.             Disposition and Follow-up:  Dr.Bensimhon in 1 week  Consults:  [facility]  Significant Diagnostic Studies:  Dg Chest Port 1 View  12/18/2011  *RADIOLOGY REPORT*  Clinical Data: Shortness of breath, CHF  PORTABLE CHEST - 1 VIEW  Comparison: 12/13/2011  Findings: Cardiomegaly with pulmonary vascular congestion and suspected mild perihilar edema.  Left basilar opacity, possibly atelectasis.  Left subclavian ICD.  IMPRESSION: Cardiomegaly with pulmonary vascular congestion and suspected mild perihilar edema.  Left basilar opacity, possibly atelectasis.  Consider PA/lateral chest radiographs, as clinically warranted.  Original Report Authenticated By: Charline Bills, M.D.    Brief H and P: 51 yo male with CM EF 10% s/p aicd,  iddm, htn, medical noncompliance comes in with mult syncopal episodes this evening while trying to go out with family members to see a movie. EMS was called and his HR was in 50's and his sbp was 70. He denies any cp, sob, le edema, fevers, n, v, d. Normal state of health prior to this. Did not feel icd firing. Was just seen by ED this past week after being OFF all bp meds for over 3 months and cardiology restarted 3 of the 5 meds in the last 3 days.  Hospital Course:  1. Syncope in the setting of hypotension, from chronic systolic CHF and medications. slowly to be restarted on home meds, discontinued Spironolactone and lisinopril for now due to hypotension. Will resume coreg and lasix 20mg  every other day 2. NICM/Ef of 10-15%, currently euvolemic, was diuresed with a one time dose of lasix in ER Continue Coreg/Digoxin/lasix every other day Cannot resume Lisinopril/Aldactone at this time due to HYPOTENSION This plan was discussed with Dr.Bensimhon this am He will FU with CHF clinic on 6/14 at 8:45am Diet and medication compliance was re-inforced   Time spent on Discharge: 35 min Signed: Warden Buffa [facility]  12/19/2011, 10:19 AM

## 2011-12-21 ENCOUNTER — Other Ambulatory Visit (INDEPENDENT_AMBULATORY_CARE_PROVIDER_SITE_OTHER): Payer: Medicare Other

## 2011-12-21 ENCOUNTER — Ambulatory Visit (INDEPENDENT_AMBULATORY_CARE_PROVIDER_SITE_OTHER): Payer: Medicare Other | Admitting: *Deleted

## 2011-12-21 DIAGNOSIS — Z95810 Presence of automatic (implantable) cardiac defibrillator: Secondary | ICD-10-CM

## 2011-12-21 DIAGNOSIS — I472 Ventricular tachycardia: Secondary | ICD-10-CM

## 2011-12-21 DIAGNOSIS — I429 Cardiomyopathy, unspecified: Secondary | ICD-10-CM

## 2011-12-21 DIAGNOSIS — I5022 Chronic systolic (congestive) heart failure: Secondary | ICD-10-CM

## 2011-12-21 LAB — ICD DEVICE OBSERVATION
AL IMPEDENCE ICD: 450 Ohm
AL THRESHOLD: 1 V
ATRIAL PACING ICD: 0 pct
BAMS-0001: 170 {beats}/min
BAMS-0003: 50 {beats}/min
DEV-0020ICD: NEGATIVE
FVT: 0
RV LEAD AMPLITUDE: 9.6 mv
TOT-0006: 20100802000000
TOT-0008: 0
TZAT-0001SLOWVT: 1
TZAT-0004SLOWVT: 8
TZAT-0012SLOWVT: 200 ms
TZON-0004SLOWVT: 24
TZST-0001SLOWVT: 3
TZST-0003SLOWVT: 30 J
VENTRICULAR PACING ICD: 0 pct

## 2011-12-21 LAB — BASIC METABOLIC PANEL
BUN: 24 mg/dL — ABNORMAL HIGH (ref 6–23)
CO2: 23 mEq/L (ref 19–32)
Chloride: 100 mEq/L (ref 96–112)
Creatinine, Ser: 0.9 mg/dL (ref 0.4–1.5)
Glucose, Bld: 226 mg/dL — ABNORMAL HIGH (ref 70–99)
Potassium: 4.6 mEq/L (ref 3.5–5.1)

## 2011-12-21 NOTE — Progress Notes (Signed)
icd check in clinic  

## 2011-12-22 ENCOUNTER — Other Ambulatory Visit: Payer: Medicare Other

## 2011-12-23 ENCOUNTER — Ambulatory Visit (HOSPITAL_COMMUNITY): Admit: 2011-12-23 | Payer: Medicare Other

## 2011-12-27 ENCOUNTER — Encounter: Payer: Medicare Other | Admitting: Physician Assistant

## 2011-12-28 ENCOUNTER — Ambulatory Visit (HOSPITAL_COMMUNITY)
Admission: RE | Admit: 2011-12-28 | Discharge: 2011-12-28 | Disposition: A | Discharge status: Home / Self Care | Payer: Medicare Other | Source: Ambulatory Visit | Attending: Internal Medicine | Admitting: Internal Medicine

## 2011-12-28 VITALS — BP 92/64 | HR 65 | Wt 165.8 lb

## 2011-12-28 DIAGNOSIS — I472 Ventricular tachycardia: Secondary | ICD-10-CM

## 2011-12-28 DIAGNOSIS — I5022 Chronic systolic (congestive) heart failure: Secondary | ICD-10-CM | POA: Insufficient documentation

## 2011-12-28 DIAGNOSIS — I959 Hypotension, unspecified: Secondary | ICD-10-CM

## 2011-12-28 NOTE — Assessment & Plan Note (Addendum)
Volume status looks good. NYHA II.  Continue lasix every other day.  Have provided the patient with a scale and weight charts.  Have discussed baseline weight and appropriate use of sliding scale lasix.  If he notices weight is falling 3 pounds below baseline he will hold lasix for that day.   Reviewed zone tool and symptoms as well as low sodium diet.  The patient and his wife voiced understanding.  BP remains soft, will not titrate medications at this time.  He may require advanced therapies in the future but currently not a candidate due to non compliance, he is willing to work on this.  Follow up 2 weeks with BMET.  30 minutes spent with patient and his family with greater than 50% of the time dedicated to education, as above.

## 2011-12-28 NOTE — Progress Notes (Signed)
PCP: [facility] - Dr. Fuller Mandril EP: Dr. Ladona Ridgel  HPI:  51 y.o. male w/ PMHx significant for chronic systolic CHF 2/2 NonIschemic CM, Paroxysmal VT s/p SJM ICD, LV Thrombus (on coumadin), and CVA '08.  He has a h/o severe cardiomyopathy 2/2 Adriamycin administration for lymphoma and PVT/V.Fib arrest requiring ICD implant in 2005. Had CVA in 2008 with residual right-sided weakness. LV thrombus in 2009 and initiated on coumadin. Recurrent VT in 2009 and initiated on sotalol. Elective replacement with St. Jude's ICD in 2010.  6/4 presented to the ED with mild HF in setting of medication noncompliance over the last 3 months.  His sotalol was discontinued due to non-compliance and prolonged QTC 493. (discussed with Dr. Ladona Ridgel).  Coumadin was also discontinued due to noncompliance.  He was discharged after IV lasix 40 mg with good diuresis.  Discharged on: Lasix 40 daily, Cleda Daub 25 daily, Carvedilol 3.125 bid, Digoxin 0.125 daily, Lisinopril 5 mg daily   12/16/11: echo: EF 15% global HK. No apical thrombus.   Ad[facility] 12/19/11 with syncope 2/2 hypotension with SBP 70.  Lisinopril and spiro stopped.  Continue on carvedilol 3.125 mg BID, dig 0.125 mg daily and lasix 20 every other day.   Started on rivaroxabn by PCP since coumadin was stopped on 6/4.  He returns for follow up today with his wife and daughter.  He feels good today.  No dyspnea/orthopnea/PND.  No edema.  Walking several blocks without difficulty.  He does not have a scale for daily weights.  He brought in his bag of medicines today and had still been taking sotalol.  Has not needed extra lasix.  Trying to follow low sodium diet.  No shocks.  No palpitations.      ROS: All systems negative except as listed in HPI, PMH and Problem List.  Past Medical History  Diagnosis Date  . Paroxysmal ventricular tachycardia     s/p AICD '05, replaced w/ St. Jude's in 2010 (PVT/V.Fib arrest requiring ICD implant in 2005)  . HYPERTENSION, UNSPECIFIED   .  HYPERLIPIDEMIA-MIXED   . 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 Nonischemic cardiomyopathy     2/2 Adriamycin administration for lymphoma  . CHF (congestive heart failure)     EF 15% by echo 2009; s/p St. Jude ICD  . Diabetes mellitus     Type 2  . Cancer      non hodkins lymphoma 1992, Hodgkins 2007  . Depression   . LV (left ventricular) mural thrombus     2009, on coumadin  . Small bowel obstruction     s/p Small bowel resection 2001  . Jejunal intussusception     2008  . 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 Splenic mass     Noted on Abd [place] 2009 w/ recs for f/u CT - not done    Current Outpatient Prescriptions  Medication Sig Dispense Refill  . aspirin 81 MG tablet Take 81 mg by mouth daily.        . carvedilol (COREG) 3.125 MG tablet Take 3.125 mg by mouth 2 (two) times daily with a meal.      . digoxin (LANOXIN) 0.125 MG tablet Take 125 mcg by mouth daily.      . furosemide (LASIX) 40 MG tablet Take 40 mg by mouth daily.      Marland Kitchen  insulin glargine (LANTUS) 100 UNIT/ML injection Inject 30 Units into the skin at bedtime.       . magnesium oxide (MAG-OX) 400 MG tablet Take 400 mg by mouth daily.      . pravastatin (PRAVACHOL) 40 MG tablet Take 40 mg by mouth daily.      . Rivaroxaban 20 MG TABS Take 1 tablet by mouth daily.      . sertraline (ZOLOFT) 25 MG tablet Take 25 mg by mouth daily.      . sotalol (BETAPACE) 80 MG tablet Take 160 mg by mouth 2 (two) times daily.      Marland Kitchen DISCONTD: furosemide (LASIX) 40 MG tablet Take 0.5 tablets (20 mg total) by mouth every other day.  30 tablet  0     PHYSICAL EXAM: Filed Vitals:   12/28/11 1049  BP: 92/64  Pulse: 65  Weight: 165 lb 12 oz (75.184 kg)  SpO2: 99%    General:  Well appearing. No resp difficulty HEENT: normal Neck: supple. JVP flat. Carotids 2+ bilaterally; no bruits.  No lymphadenopathy or thryomegaly appreciated. Cor: PMI normal. Regular rate & rhythm. No rubs, gallops or murmurs. Lungs: clear Abdomen: soft, nontender, nondistended. No hepatosplenomegaly. No bruits or masses. Good bowel sounds. Extremities: no cyanosis, clubbing, rash, edema Neuro: alert & orientedx3, cranial nerves grossly intact. Moves all 4 extremities w/o difficulty. Affect pleasant.     ASSESSMENT & PLAN:

## 2011-12-28 NOTE — Assessment & Plan Note (Signed)
Per Dr. Prescott Gum consult on 6/4 the patient was suppose to discontinue sotalol use.  Will stop today.  Follow up with Dr. Ladona Ridgel in July to discuss further options.

## 2011-12-28 NOTE — Assessment & Plan Note (Signed)
Stable, as above will not titrate meds at this time.

## 2011-12-28 NOTE — Patient Instructions (Addendum)
Stop sotalol.    Bring weight log to appointment on July 1st.    Do the following things EVERYDAY: 1) Weigh yourself in the morning before breakfast. Write it down and keep it in a log. 2) Take your medicines as prescribed 3) Eat low salt foods--Limit salt (sodium) to 2000 mg per day.  4) Stay as active as you can everyday 5) Limit all fluids for the day to less than 2 liters

## 2012-01-09 ENCOUNTER — Ambulatory Visit (HOSPITAL_COMMUNITY)
Admission: RE | Admit: 2012-01-09 | Discharge: 2012-01-09 | Disposition: A | Discharge status: Home / Self Care | Payer: Medicare Other | Source: Ambulatory Visit | Attending: Internal Medicine | Admitting: Internal Medicine

## 2012-01-09 ENCOUNTER — Encounter (HOSPITAL_COMMUNITY): Payer: Self-pay

## 2012-01-09 VITALS — BP 105/60 | HR 73 | Ht 67.0 in | Wt 165.8 lb

## 2012-01-09 DIAGNOSIS — I472 Ventricular tachycardia, unspecified: Secondary | ICD-10-CM | POA: Insufficient documentation

## 2012-01-09 DIAGNOSIS — F3289 Other specified depressive episodes: Secondary | ICD-10-CM | POA: Insufficient documentation

## 2012-01-09 DIAGNOSIS — Z7982 Long term (current) use of aspirin: Secondary | ICD-10-CM | POA: Insufficient documentation

## 2012-01-09 DIAGNOSIS — Z8673 Personal history of transient ischemic attack (TIA), and cerebral infarction without residual deficits: Secondary | ICD-10-CM | POA: Insufficient documentation

## 2012-01-09 DIAGNOSIS — F329 Major depressive disorder, single episode, unspecified: Secondary | ICD-10-CM | POA: Insufficient documentation

## 2012-01-09 DIAGNOSIS — Z794 Long term (current) use of insulin: Secondary | ICD-10-CM | POA: Insufficient documentation

## 2012-01-09 DIAGNOSIS — E119 Type 2 diabetes mellitus without complications: Secondary | ICD-10-CM | POA: Insufficient documentation

## 2012-01-09 DIAGNOSIS — I4729 Other ventricular tachycardia: Secondary | ICD-10-CM | POA: Insufficient documentation

## 2012-01-09 DIAGNOSIS — D551 Anemia due to other disorders of glutathione metabolism: Secondary | ICD-10-CM | POA: Insufficient documentation

## 2012-01-09 DIAGNOSIS — Z91199 Patient's noncompliance with other medical treatment and regimen due to unspecified reason: Secondary | ICD-10-CM | POA: Insufficient documentation

## 2012-01-09 DIAGNOSIS — Z86718 Personal history of other venous thrombosis and embolism: Secondary | ICD-10-CM | POA: Insufficient documentation

## 2012-01-09 DIAGNOSIS — I5022 Chronic systolic (congestive) heart failure: Secondary | ICD-10-CM

## 2012-01-09 DIAGNOSIS — I10 Essential (primary) hypertension: Secondary | ICD-10-CM | POA: Insufficient documentation

## 2012-01-09 DIAGNOSIS — Z7901 Long term (current) use of anticoagulants: Secondary | ICD-10-CM | POA: Insufficient documentation

## 2012-01-09 DIAGNOSIS — I509 Heart failure, unspecified: Secondary | ICD-10-CM | POA: Insufficient documentation

## 2012-01-09 DIAGNOSIS — Z87898 Personal history of other specified conditions: Secondary | ICD-10-CM | POA: Insufficient documentation

## 2012-01-09 DIAGNOSIS — Z9119 Patient's noncompliance with other medical treatment and regimen: Secondary | ICD-10-CM | POA: Insufficient documentation

## 2012-01-09 DIAGNOSIS — Z9111 Patient's noncompliance with dietary regimen: Secondary | ICD-10-CM

## 2012-01-09 DIAGNOSIS — I428 Other cardiomyopathies: Secondary | ICD-10-CM | POA: Insufficient documentation

## 2012-01-09 DIAGNOSIS — E785 Hyperlipidemia, unspecified: Secondary | ICD-10-CM | POA: Insufficient documentation

## 2012-01-09 LAB — BASIC METABOLIC PANEL
CO2: 30 mEq/L (ref 19–32)
Chloride: 100 mEq/L (ref 96–112)
Potassium: 4.7 mEq/L (ref 3.5–5.1)
Sodium: 140 mEq/L (ref 135–145)

## 2012-01-09 MED ORDER — LISINOPRIL 5 MG PO TABS: 2.5000 mg | ORAL_TABLET | Freq: Every day | ORAL | Status: DC

## 2012-01-09 NOTE — Progress Notes (Signed)
HPI:  51 y.o. male w/ PMHx significant for chronic systolic CHF 2/2 NonIschemic CM, Paroxysmal VT s/p SJM ICD, LV Thrombus (on coumadin), and CVA '08 who presented to [facility] on 12/13/2011 with complaints of dyspnea after being medically noncompliant for the last 3mos.   He has a h/o severe cardiomyopathy 2/2 Adriamycin administration for lymphoma and PVT/V.Fib arrest requiring ICD implant in 2005. Had CVA in 2008 with residual right-sided weakness. LV thrombus in 2009 and initiated on coumadin. Recurrent VT in 2009 and initiated on sotalol. Elective replacement with St. Jude's ICD in 2010. Last clinic visit/ICD interrogation 06/2011. He reports his wife usually takes care of all his medications and appointments, but he decided to start doing it himself in December. Has not had medical care or taken any of his medications in the last 3 months.   In ER, EKG revealed NSR w/ 1st degree AV Block 88bpm, LAD, QTc 493 no acute ischemic changes. CXR shows small effusions. He has received 40mg  IV lasix in the ED with good diuresis.  Discharged on Lasix 40 daily, Spiro 25 daily, Carvedilol 3.125 bid, Digoxin 0.125 daily, Lisinopril 5 mg daily.  Discussed sotalol issue with Dr. Ladona Ridgel. Given non-compliance and prolonged QTC ( with QRS 94 ms) will hold off on re-initiating sotalol for now. Given noncompliance held off on coumadin for now as well.  Readmitted for syncope on 6/8 felt to be due to hypotension, spiro and lisinopril stopped.   12/16/11: echo this am: EF 15% global HK. No apical thrombus.   He is here for 2 week follow up today with his wife.  He feels well.  He denies SOB/orthopnea/PND.  Weight is 158-160 pounds at home, no extra lasix.    Afraid to drink a lot of fluid during the day.  No dizziness/syncope.  No edema. No ICD shock.   ROS: All systems negative except as listed in HPI, PMH and Problem List.  Past Medical History  Diagnosis Date  . Paroxysmal ventricular tachycardia    s/p AICD '05, replaced w/ St. Jude's in 2010 (PVT/V.Fib arrest requiring ICD implant in 2005)  . HYPERTENSION, UNSPECIFIED   . HYPERLIPIDEMIA-MIXED   . 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 Nonischemic cardiomyopathy     2/2 Adriamycin administration for lymphoma  . CHF (congestive heart failure)     EF 15% by echo 2009; s/p St. Jude ICD  . Diabetes mellitus     Type 2  . Cancer      non hodkins lymphoma 1992, Hodgkins 2007  . Depression   . LV (left ventricular) mural thrombus     2009, on coumadin  . Small bowel obstruction     s/p Small bowel resection 2001  . Jejunal intussusception     2008  . 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 Splenic mass     Noted on Abd [place] 2009 w/ recs for f/u CT - not done    Current Outpatient Prescriptions  Medication Sig Dispense Refill  . aspirin 81 MG tablet Take 81 mg by mouth daily.        . carvedilol (COREG) 3.125 MG tablet Take 3.125 mg by mouth 2 (two) times daily with a meal.      . digoxin (LANOXIN) 0.125 MG tablet Take 125 mcg by mouth daily.      Marland Kitchen  furosemide (LASIX) 40 MG tablet Take 40 mg by mouth daily.      . insulin glargine (LANTUS) 100 UNIT/ML injection Inject 30 Units into the skin at bedtime.       . magnesium oxide (MAG-OX) 400 MG tablet Take 400 mg by mouth daily.      . pravastatin (PRAVACHOL) 40 MG tablet Take 40 mg by mouth daily.      . Rivaroxaban 20 MG TABS Take 1 tablet by mouth daily.      . sertraline (ZOLOFT) 25 MG tablet Take 25 mg by mouth daily.         PHYSICAL EXAM: Filed Vitals:   01/09/12 0937  BP: 105/60  Pulse: 73  Height: 5\' 7"  (1.702 m)  Weight: 165 lb 12.8 oz (75.206 kg)  SpO2: 100%    General:  Well appearing. No resp difficulty HEENT: normal Neck: supple. JVP flat. Carotids 2+ bilaterally; no bruits. No lymphadenopathy or thryomegaly  appreciated. Cor: PMI normal. Regular rate & rhythm. No rubs, gallops or murmurs. Lungs: clear Abdomen: soft, nontender, nondistended. No hepatosplenomegaly. No bruits or masses. Good bowel sounds. Extremities: no cyanosis, clubbing, rash, edema Neuro: alert & orientedx3, cranial nerves grossly intact. Moves all 4 extremities w/o difficulty. Affect pleasant.     ASSESSMENT & PLAN:

## 2012-01-09 NOTE — Assessment & Plan Note (Signed)
Off sotalol now, will follow up with Dr. Ladona Ridgel this month.

## 2012-01-09 NOTE — Assessment & Plan Note (Signed)
Compliance has improved will continue to follow closely.

## 2012-01-09 NOTE — Patient Instructions (Addendum)
Lisinopril 2.5 mg (0.5 tab) at night.  Follow up 3 weeks with Dr. Gala Romney.    Do the following things EVERYDAY: 1) Weigh yourself in the morning before breakfast. Write it down and keep it in a log. 2) Take your medicines as prescribed 3) Eat low salt foods--Limit salt (sodium) to 2000 mg per day.  4) Stay as active as you can everyday 5) Limit all fluids for the day to less than 2 liters

## 2012-01-09 NOTE — Assessment & Plan Note (Signed)
NYHA II.  Volume status stable.  Will continue sliding scale lasix, patient and wife voice understanding.  With SBP 105 today will add back lisinopril 2.5 mg (0.5 tab) nightly with plans to titrate further at follow up.  BMET today.

## 2012-01-30 ENCOUNTER — Ambulatory Visit (HOSPITAL_COMMUNITY)
Admission: RE | Admit: 2012-01-30 | Discharge: 2012-01-30 | Disposition: A | Discharge status: Home / Self Care | Payer: Medicare Other | Source: Ambulatory Visit | Attending: Internal Medicine | Admitting: Internal Medicine

## 2012-01-30 ENCOUNTER — Telehealth (HOSPITAL_COMMUNITY): Payer: Self-pay | Admitting: Internal Medicine

## 2012-01-30 ENCOUNTER — Encounter (HOSPITAL_COMMUNITY): Payer: Self-pay

## 2012-01-30 VITALS — BP 122/60 | HR 84 | Resp 18 | Ht 67.0 in | Wt 161.4 lb

## 2012-01-30 DIAGNOSIS — I5022 Chronic systolic (congestive) heart failure: Secondary | ICD-10-CM | POA: Insufficient documentation

## 2012-01-30 MED ORDER — LISINOPRIL 5 MG PO TABS: 5.0000 mg | ORAL_TABLET | Freq: Every day | ORAL | Status: DC

## 2012-01-30 NOTE — Progress Notes (Signed)
Patient ID: Andre Tran, male   DOB: 05/14/1961, 51 y.o.   MRN: [id number] HPI:  51 y.o. male w/ PMHx significant for chronic systolic CHF 2/2 NonIschemic CM, Paroxysmal VT s/p SJM ICD, LV Thrombus (on coumadin), and CVA '08 who presented to [facility] on 12/13/2011 with complaints of dyspnea after being medically noncompliant for the last 3mos.   He has a h/o severe cardiomyopathy 2/2 Adriamycin administration for lymphoma and PVT/V.Fib arrest requiring ICD implant in 2005. Had CVA in 2008 with residual right-sided weakness. LV thrombus in 2009 and initiated on coumadin. Recurrent VT in 2009 and initiated on sotalol. Elective replacement with St. Jude's ICD in 2010. Last clinic visit/ICD interrogation 06/2011. He reports his wife usually takes care of all his medications and appointments, but he decided to start doing it himself in December. Has not had medical care or taken any of his medications in the last 3 months.   In ER, EKG revealed NSR w/ 1st degree AV Block 88bpm, LAD, QTc 493 no acute ischemic changes. CXR shows small effusions. He has received 40mg  IV lasix in the ED with good diuresis.  Discharged on Lasix 40 daily, Spiro 25 daily, Carvedilol 3.125 bid, Digoxin 0.125 daily, Lisinopril 5 mg daily.  Discussed sotalol issue with Dr. Ladona Ridgel. Given non-compliance and prolonged QTC ( with QRS 94 ms) will hold off on re-initiating sotalol for now. Given noncompliance held off on coumadin for now as well.  Readmitted for syncope on 6/8 felt to be due to hypotension, spiro and lisinopril stopped.   12/16/11: echo this am: EF 15% global HK. No apical thrombus.   He is here for follow up. Last visit lisinopril 2.5 mg added at night (he can not take lisinopril during the day due to dizziness). Denies SOB/PND/Orthopnea/CP.  Denies dizziness. Weight at home 155-158 pounds. Compliant with medications. No shocks.   ROS: All systems negative except as listed in HPI, PMH and Problem List.  Past  Medical History  Diagnosis Date  . Paroxysmal ventricular tachycardia     s/p AICD '05, replaced w/ St. Jude's in 2010 (PVT/V.Fib arrest requiring ICD implant in 2005)  . HYPERTENSION, UNSPECIFIED   . HYPERLIPIDEMIA-MIXED   . 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 Nonischemic cardiomyopathy     2/2 Adriamycin administration for lymphoma  . CHF (congestive heart failure)     EF 15% by echo 2009; s/p St. Jude ICD  . Diabetes mellitus     Type 2  . Cancer      non hodkins lymphoma 1992, Hodgkins 2007  . Depression   . LV (left ventricular) mural thrombus     2009, on coumadin  . Small bowel obstruction     s/p Small bowel resection 2001  . Jejunal intussusception     2008  . 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 Splenic mass     Noted on Abd [place] 2009 w/ recs for f/u CT - not done    Current Outpatient Prescriptions  Medication Sig Dispense Refill  . aspirin 81 MG tablet Take 81 mg by mouth daily.        . carvedilol (COREG) 3.125 MG tablet Take 3.125 mg by mouth 2 (two) times daily with a meal.      . digoxin (LANOXIN) 0.125 MG tablet Take 125 mcg  by mouth daily.      . furosemide (LASIX) 40 MG tablet Take 40 mg by mouth daily.      . insulin glargine (LANTUS) 100 UNIT/ML injection Inject 30 Units into the skin at bedtime.       Marland Kitchen lisinopril (PRINIVIL,ZESTRIL) 5 MG tablet Take 0.5 tablets (2.5 mg total) by mouth daily.      . magnesium oxide (MAG-OX) 400 MG tablet Take 400 mg by mouth daily.      . pravastatin (PRAVACHOL) 40 MG tablet Take 40 mg by mouth daily.      . Rivaroxaban 20 MG TABS Take 1 tablet by mouth daily.      . sertraline (ZOLOFT) 25 MG tablet Take 25 mg by mouth daily.         PHYSICAL EXAM: Filed Vitals:   01/30/12 0959  BP: 122/60  Pulse: 84  Resp: 18  Height: 5\' 7"  (1.702 m)  Weight: 161 lb 6.4 oz  (73.211 kg)  SpO2: 100%    General:  Well appearing. No resp difficulty HEENT: normal Neck: supple. JVP flat. Carotids 2+ bilaterally; no bruits. No lymphadenopathy or thryomegaly appreciated. Cor: PMI normal. Regular rate & rhythm. No rubs, gallops or murmurs. Lungs: clear Abdomen: soft, nontender, nondistended. No hepatosplenomegaly. No bruits or masses. Good bowel sounds. Extremities: no cyanosis, clubbing, rash, edema Neuro: alert & orientedx3, cranial nerves grossly intact. Moves all 4 extremities w/o difficulty. Affect pleasant.     ASSESSMENT & PLAN:

## 2012-01-30 NOTE — Assessment & Plan Note (Signed)
Volume status stable. Continue current diuretics. Blood pressure ok. Reviewed most recent BMET.  Increase Lisinopril to 5 mg at bed time because he does not tolerate ace inhibitor during the day. He is instructed to take an additional lasix if his weight is 162 pounds or greater in 24 hours. Follow up in 3 weeks and repeat BMET. Anticipate increasing beta blocker at next office visit.

## 2012-01-30 NOTE — Patient Instructions (Addendum)
Follow up in 3 weeks.   Take Lisinopril 5 mg daily at bed time  Do the following things EVERYDAY: 1) Weigh yourself in the morning before breakfast. Write it down and keep it in a log. 2) Take your medicines as prescribed 3) Eat low salt foods-Limit salt (sodium) to 2000 mg per day.  4) Stay as active as you can everyday 5) Limit all fluids for the day to less than 2 liters

## 2012-01-30 NOTE — Telephone Encounter (Signed)
Left message to call back  

## 2012-01-30 NOTE — Telephone Encounter (Signed)
PT'S  WIFE WOULD LIKE A RETURN CALL CONCERNING WHY PT HAD TO RESTART CERTAIN MEDS. SHE DIDN'T STATE WHAT MEDS BUT WOULD LIKE A RETURN CALL. THANKS

## 2012-02-01 NOTE — Telephone Encounter (Signed)
Left message to call back  

## 2012-02-07 ENCOUNTER — Encounter: Payer: Self-pay | Admitting: Internal Medicine

## 2012-02-07 ENCOUNTER — Ambulatory Visit (INDEPENDENT_AMBULATORY_CARE_PROVIDER_SITE_OTHER): Payer: Medicare Other | Admitting: Internal Medicine

## 2012-02-07 VITALS — BP 104/68 | HR 60 | Ht 66.0 in | Wt 163.8 lb

## 2012-02-07 DIAGNOSIS — I472 Ventricular tachycardia, unspecified: Secondary | ICD-10-CM

## 2012-02-07 DIAGNOSIS — I429 Cardiomyopathy, unspecified: Secondary | ICD-10-CM

## 2012-02-07 DIAGNOSIS — I10 Essential (primary) hypertension: Secondary | ICD-10-CM

## 2012-02-07 DIAGNOSIS — Z95810 Presence of automatic (implantable) cardiac defibrillator: Secondary | ICD-10-CM

## 2012-02-07 NOTE — Assessment & Plan Note (Signed)
His St. Jude device is working normally. Approximately 4 weeks ago, his fluid index monitor was elevated, but this is now improved. No intercurrent ICD therapies.

## 2012-02-07 NOTE — Patient Instructions (Addendum)
Your physician recommends that you schedule a follow-up appointment in: 3 months with Dr Ladona Ridgel and 6 months with Dr Ladona Ridgel

## 2012-02-07 NOTE — Assessment & Plan Note (Signed)
His blood pressure today is well controlled. He'll continue his current medical therapy and maintain a low-sodium diet.

## 2012-02-07 NOTE — Assessment & Plan Note (Signed)
He has had no recurrent ventricular arrhythmias. No change in medications today. He will continue his current therapy.

## 2012-02-07 NOTE — Progress Notes (Signed)
HPI Mr. Durio returns today for followup. He is a very pleasant 51 year old man with a nonischemic cardiomyopathy, chronic systolic heart failure, status post ICD implantation. He also is a history of ventricular tachycardia and a mural thrombus for which she is on chronic anticoagulation. The patient denies chest pain. He has class II heart failure symptoms. He does admit to some dietary indiscretion with sodium. No Known Allergies   Current Outpatient Prescriptions  Medication Sig Dispense Refill  . aspirin 81 MG tablet Take 81 mg by mouth daily.        . carvedilol (COREG) 3.125 MG tablet Take 3.125 mg by mouth 2 (two) times daily with a meal.      . digoxin (LANOXIN) 0.125 MG tablet Take 125 mcg by mouth daily.      . furosemide (LASIX) 40 MG tablet Take 40 mg by mouth daily.      . insulin glargine (LANTUS) 100 UNIT/ML injection Inject 30 Units into the skin at bedtime.       Marland Kitchen lisinopril (PRINIVIL,ZESTRIL) 5 MG tablet Take 1 tablet (5 mg total) by mouth daily.      . magnesium oxide (MAG-OX) 400 MG tablet Take 400 mg by mouth daily.      . pravastatin (PRAVACHOL) 40 MG tablet Take 40 mg by mouth daily.      . Rivaroxaban 20 MG TABS Take 1 tablet by mouth daily.      . sertraline (ZOLOFT) 25 MG tablet Take 25 mg by mouth daily.         Past Medical History  Diagnosis Date  . Paroxysmal ventricular tachycardia     s/p AICD '05, replaced w/ St. Jude's in 2010 (PVT/V.Fib arrest requiring ICD implant in 2005)  . HYPERTENSION, UNSPECIFIED   . HYPERLIPIDEMIA-MIXED   . 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 Nonischemic cardiomyopathy     2/2 Adriamycin administration for lymphoma  . CHF (congestive heart failure)     EF 15% by echo 2009; s/p St. Jude ICD  . Diabetes mellitus     Type 2  . Cancer      non hodkins lymphoma 1992, Hodgkins 2007  . Depression   . LV (left  ventricular) mural thrombus     2009, on coumadin  . Small bowel obstruction     s/p Small bowel resection 2001  . Jejunal intussusception     2008  . 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 Splenic mass     Noted on Abd [place] 2009 w/ recs for f/u CT - not done    ROS:   All systems reviewed and negative except as noted in the HPI.   Past Surgical History  Procedure Date  . Cardiac defibrillator placement     2005, replaced w/ St. Jude's 2010  . Vasectomy   . Bowel resection     small bowell 2/2 obstruction 2001  . Pacemaker insertion      Family History  Problem Relation Age of Onset  . Other      No known family h/o heart disease  . Diabetes Mother      History   Social History  . Marital Status: Married    Spouse Name: N/A    Number of Children: N/A  . Years of Education: N/A   Occupational History  .  Not on file.   Social History Main Topics  . Smoking status: Never Smoker   . Smokeless tobacco: Never Used  . Alcohol Use: No  . Drug Use: No  . Sexually Active: Not on file   Other Topics Concern  . Not on file   Social History Narrative  . No narrative on file     BP 104/68  Pulse 60  Ht 5\' 6"  (1.676 m)  Wt 163 lb 12.8 oz (74.299 kg)  BMI 26.44 kg/m2  Physical Exam:  Well appearing middle-aged man, NAD HEENT: Unremarkable Neck:  No JVD, no thyromegally Lungs:  Clear with no wheezes, rales, or rhonchi. HEART:  Regular rate rhythm, no murmurs, no rubs, no clicks Abd:  soft, positive bowel sounds, no organomegally, no rebound, no guarding Ext:  2 plus pulses, no edema, no cyanosis, no clubbing Skin:  No rashes no nodules Neuro:  CN II through XII intact, motor grossly intact  DEVICE  Normal device function.  See PaceArt for details.   Assess/Plan:

## 2012-02-09 LAB — ICD DEVICE OBSERVATION
AL THRESHOLD: 1 V
ATRIAL PACING ICD: 0.01 pct
BAMS-0003: 50 {beats}/min
DEV-0020ICD: NEGATIVE
DEVICE MODEL ICD: 593606
FVT: 0
PACEART VT: 0
RV LEAD AMPLITUDE: 10.8 mv
RV LEAD IMPEDENCE ICD: 362.5 Ohm
TOT-0009: 1
TZAT-0001SLOWVT: 1
TZAT-0012SLOWVT: 200 ms
TZAT-0020SLOWVT: 1 ms
TZON-0005SLOWVT: 6
TZON-0010SLOWVT: 80 ms
TZST-0001SLOWVT: 2
TZST-0001SLOWVT: 4
TZST-0003SLOWVT: 30 J
VENTRICULAR PACING ICD: 0 pct

## 2012-02-21 ENCOUNTER — Inpatient Hospital Stay (HOSPITAL_COMMUNITY): Admission: RE | Admit: 2012-02-21 | Payer: Medicare Other | Source: Ambulatory Visit

## 2012-02-21 NOTE — Telephone Encounter (Signed)
Have not gotten return calls and pt no showed for appt 8/13

## 2012-05-16 ENCOUNTER — Encounter: Payer: Self-pay | Admitting: *Deleted

## 2012-05-23 ENCOUNTER — Encounter: Payer: Self-pay | Admitting: Internal Medicine

## 2012-07-05 ENCOUNTER — Encounter: Payer: Self-pay | Admitting: *Deleted

## 2012-08-14 ENCOUNTER — Encounter: Payer: Self-pay | Admitting: Cardiology

## 2012-08-14 ENCOUNTER — Encounter: Payer: Self-pay | Admitting: Internal Medicine

## 2012-08-14 ENCOUNTER — Ambulatory Visit (INDEPENDENT_AMBULATORY_CARE_PROVIDER_SITE_OTHER): Payer: Medicare Other | Admitting: Cardiology

## 2012-08-14 VITALS — BP 110/66 | HR 77 | Ht 67.0 in | Wt 171.0 lb

## 2012-08-14 DIAGNOSIS — Z95810 Presence of automatic (implantable) cardiac defibrillator: Secondary | ICD-10-CM

## 2012-08-14 DIAGNOSIS — I428 Other cardiomyopathies: Secondary | ICD-10-CM

## 2012-08-14 DIAGNOSIS — I5022 Chronic systolic (congestive) heart failure: Secondary | ICD-10-CM

## 2012-08-14 DIAGNOSIS — I472 Ventricular tachycardia: Secondary | ICD-10-CM

## 2012-08-14 LAB — ICD DEVICE OBSERVATION
BAMS-0001: 170 {beats}/min
BAMS-0003: 50 {beats}/min
FVT: 0
HV IMPEDENCE: 48 Ohm
PACEART VT: 0
RV LEAD IMPEDENCE ICD: 360 Ohm
RV LEAD THRESHOLD: 0.75 V
TZAT-0001SLOWVT: 1
TZAT-0004SLOWVT: 8
TZAT-0012SLOWVT: 200 ms
TZAT-0018SLOWVT: NEGATIVE
TZAT-0019SLOWVT: 7.5 V
TZON-0003SLOWVT: 310 ms
TZON-0004SLOWVT: 24
TZST-0001SLOWVT: 3
TZST-0003SLOWVT: 30 J

## 2012-08-14 NOTE — Progress Notes (Signed)
ELECTROPHYSIOLOGY OFFICE NOTE  Patient ID: Andre Tran MRN: [id number], DOB/AGE: [birth year]   Date of Visit: 08/14/2012  Primary Cardiologist: Gala Romney, MD Primary EP: Ladona Ridgel, MD Reason for Visit: EP/device follow-up  History of Present Illness  Andre Tran is a 52 year old man severe, nonischemic cardiomyopathy after Adriamycin administration for lymphoma, VT/VF arrest requiring ICD implant in 2005. Had CVA in 2008 with residual right-sided weakness. LV thrombus in 2009 and initiated on chronic anticoagulation. Recurrent VT in 2009 and initiated on sotalol. Elective replacement with St. Jude's ICD in 2010.   He presents today for routine electrophysiology followup. Since last being seen in our clinic, he reports he is doing well, "doing very well." Today, he denies chest pain, shortness of breath, palpitations, dizziness, near syncope or syncope. He denies LE swelling, orthopnea, PND or recent weight gain. The patient reports he is compliant and tolerating medications without difficulty.  Past Medical History Past Medical History  Diagnosis Date  . Paroxysmal ventricular tachycardia     s/p AICD '05, replaced w/ St. Jude's in 2010 (PVT/V.Fib arrest requiring ICD implant in 2005)  . HYPERTENSION, UNSPECIFIED   . HYPERLIPIDEMIA-MIXED   . 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 Nonischemic cardiomyopathy     2/2 Adriamycin administration for lymphoma  . CHF (congestive heart failure)     EF 15% by echo 2009; s/p St. Jude ICD  . Diabetes mellitus     Type 2  . Cancer      non hodkins lymphoma 1992, Hodgkins 2007  . Depression   . LV (left ventricular) mural thrombus     2009, on coumadin  . Small bowel obstruction     s/p Small bowel resection 2001  . Jejunal intussusception     2008  . 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  Splenic mass     Noted on Abd [place] 2009 w/ recs for f/u CT - not done    Past Surgical History Past Surgical History  Procedure Date  . Cardiac defibrillator placement     2005, replaced w/ St. Jude's 2010  . Vasectomy   . Bowel resection     small bowell 2/2 obstruction 2001  . Pacemaker insertion      Allergies/Intolerances No Known Allergies  Current Home Medications Current Outpatient Prescriptions  Medication Sig Dispense Refill  . aspirin 81 MG tablet Take 81 mg by mouth daily.        . carvedilol (COREG) 3.125 MG tablet Take 3.125 mg by mouth 2 (two) times daily with a meal.      . digoxin (LANOXIN) 0.125 MG tablet Take 125 mcg by mouth daily.      . furosemide (LASIX) 40 MG tablet Take 40 mg by mouth daily.      . insulin glargine (LANTUS) 100 UNIT/ML injection Inject 30 Units into the skin at bedtime.       Marland Kitchen lisinopril (PRINIVIL,ZESTRIL) 5 MG tablet Take 1 tablet (5 mg total) by mouth daily.      . magnesium oxide (MAG-OX) 400 MG tablet Take 400 mg by mouth daily.      . pravastatin (PRAVACHOL) 40 MG tablet Take 40 mg by mouth daily.      . Rivaroxaban 20 MG TABS Take 1 tablet by mouth daily.      Marland Kitchen  sertraline (ZOLOFT) 25 MG tablet Take 25 mg by mouth daily.        Social History Social History  . Marital Status: Married   Social History Main Topics  . Smoking status: Never Smoker   . Smokeless tobacco: Never Used  . Alcohol Use: No  . Drug Use: No   Review of Systems General: No chills, fever, night sweats or weight changes Cardiovascular: No chest pain, dyspnea on exertion, edema, orthopnea, palpitations, paroxysmal nocturnal dyspnea Dermatological: No rash, lesions or masses Respiratory: No cough, dyspnea Urologic: No hematuria, dysuria Abdominal: No nausea, vomiting, diarrhea, bright red blood per rectum, melena, or hematemesis Neurologic: No visual changes, weakness, changes in mental status All other systems reviewed and are otherwise negative except as  noted above.  Physical Exam Blood pressure 110/66, pulse 77, height 5\' 7"  (1.702 m), weight 171 lb (77.565 kg), SpO2 98.00%.  General: Well developed, well appearing 52 year old male in no acute distress. HEENT: Normocephalic, atraumatic. EOMs intact. Sclera nonicteric. Oropharynx clear.  Neck: Supple without bruits. No JVD. Lungs: Respirations regular and unlabored, CTA bilaterally. No wheezes, rales or rhonchi. Heart: RRR. S1, S2 present. No murmurs, rub, S3 or S4. Abdomen: Soft, non-tender, non-distended. BS present x 4 quadrants. No hepatosplenomegaly.  Extremities: No clubbing, cyanosis or edema. DP/PT/Radials 2+ and equal bilaterally. Psych: Normal affect. Neuro: Alert and oriented X 3. Moves all extremities spontaneously.   Diagnostics Device interrogation performed by me in clinic shows normal dual chamber ICD function with stable lead measurements/parameters; no VT/VF episodes; 7 AMS episodes, <1% of the time, longest 42 seconds, EGM most consistent with an atrial tachycardia with 1:1 conduction; no programming changes made; see PaceArt report  Assessment and Plan 1. VF arrest s/p ICD implant 2005, gen change 2010 Normal device function No VT/VF episodes No programming changes made See PaceArt report 2. Nonischemic CM with chronic systolic HF Euvolemic by exam today Fluid index monitor stable Continue current medical therapy  Continue low sodium diet and daily weights 3. Paroxysmal VT No VT/VF episodes on device interrogation today Continue BB  As above. In addition, he will continue remote device follow-up every 3 months. He will see Dr. Ladona Ridgel in 6 months. Signed, Rick Duff, PA-C 08/14/2012, 1:37 PM

## 2012-08-14 NOTE — Patient Instructions (Signed)
Remote monitoring is used to monitor your Pacemaker of ICD from home. This monitoring reduces the number of office visits required to check your device to one time per year. It allows us to keep an eye on the functioning of your device to ensure it is working properly. You are scheduled for a device check from home on 11/12/12. You may send your transmission at any time that day. If you have a wireless device, the transmission will be sent automatically. After your physician reviews your transmission, you will receive a postcard with your next transmission date.   

## 2012-11-02 ENCOUNTER — Other Ambulatory Visit: Payer: Self-pay

## 2012-11-02 ENCOUNTER — Emergency Department (HOSPITAL_COMMUNITY)
Admission: EM | Admit: 2012-11-02 | Discharge: 2012-11-02 | Disposition: A | Discharge status: Home / Self Care | Payer: Medicare Other | Attending: Emergency Medicine | Admitting: Emergency Medicine

## 2012-11-02 ENCOUNTER — Emergency Department (HOSPITAL_COMMUNITY): Payer: Medicare Other

## 2012-11-02 ENCOUNTER — Encounter (HOSPITAL_COMMUNITY): Payer: Self-pay | Admitting: Family Medicine

## 2012-11-02 DIAGNOSIS — Z79899 Other long term (current) drug therapy: Secondary | ICD-10-CM | POA: Insufficient documentation

## 2012-11-02 DIAGNOSIS — Z7982 Long term (current) use of aspirin: Secondary | ICD-10-CM | POA: Insufficient documentation

## 2012-11-02 DIAGNOSIS — Z9119 Patient's noncompliance with other medical treatment and regimen: Secondary | ICD-10-CM | POA: Insufficient documentation

## 2012-11-02 DIAGNOSIS — Z794 Long term (current) use of insulin: Secondary | ICD-10-CM | POA: Insufficient documentation

## 2012-11-02 DIAGNOSIS — I509 Heart failure, unspecified: Secondary | ICD-10-CM

## 2012-11-02 DIAGNOSIS — R0602 Shortness of breath: Secondary | ICD-10-CM | POA: Insufficient documentation

## 2012-11-02 DIAGNOSIS — R05 Cough: Secondary | ICD-10-CM | POA: Insufficient documentation

## 2012-11-02 DIAGNOSIS — R059 Cough, unspecified: Secondary | ICD-10-CM

## 2012-11-02 DIAGNOSIS — F329 Major depressive disorder, single episode, unspecified: Secondary | ICD-10-CM | POA: Insufficient documentation

## 2012-11-02 DIAGNOSIS — Z8719 Personal history of other diseases of the digestive system: Secondary | ICD-10-CM | POA: Insufficient documentation

## 2012-11-02 DIAGNOSIS — E119 Type 2 diabetes mellitus without complications: Secondary | ICD-10-CM | POA: Insufficient documentation

## 2012-11-02 DIAGNOSIS — R0609 Other forms of dyspnea: Secondary | ICD-10-CM | POA: Insufficient documentation

## 2012-11-02 DIAGNOSIS — E782 Mixed hyperlipidemia: Secondary | ICD-10-CM | POA: Insufficient documentation

## 2012-11-02 DIAGNOSIS — J302 Other seasonal allergic rhinitis: Secondary | ICD-10-CM

## 2012-11-02 DIAGNOSIS — J3489 Other specified disorders of nose and nasal sinuses: Secondary | ICD-10-CM | POA: Insufficient documentation

## 2012-11-02 DIAGNOSIS — Z87898 Personal history of other specified conditions: Secondary | ICD-10-CM | POA: Insufficient documentation

## 2012-11-02 DIAGNOSIS — Z91199 Patient's noncompliance with other medical treatment and regimen due to unspecified reason: Secondary | ICD-10-CM | POA: Insufficient documentation

## 2012-11-02 DIAGNOSIS — I10 Essential (primary) hypertension: Secondary | ICD-10-CM | POA: Insufficient documentation

## 2012-11-02 DIAGNOSIS — Z8679 Personal history of other diseases of the circulatory system: Secondary | ICD-10-CM | POA: Insufficient documentation

## 2012-11-02 DIAGNOSIS — R0989 Other specified symptoms and signs involving the circulatory and respiratory systems: Secondary | ICD-10-CM | POA: Insufficient documentation

## 2012-11-02 DIAGNOSIS — F3289 Other specified depressive episodes: Secondary | ICD-10-CM | POA: Insufficient documentation

## 2012-11-02 DIAGNOSIS — Z7901 Long term (current) use of anticoagulants: Secondary | ICD-10-CM | POA: Insufficient documentation

## 2012-11-02 DIAGNOSIS — Z95810 Presence of automatic (implantable) cardiac defibrillator: Secondary | ICD-10-CM | POA: Insufficient documentation

## 2012-11-02 LAB — CBC
HCT: 34.1 % — ABNORMAL LOW (ref 39.0–52.0)
Hemoglobin: 11.9 g/dL — ABNORMAL LOW (ref 13.0–17.0)
MCH: 30.5 pg (ref 26.0–34.0)
MCHC: 34.9 g/dL (ref 30.0–36.0)
MCV: 87.4 fL (ref 78.0–100.0)
Platelets: 293 K/uL (ref 150–400)
RBC: 3.9 MIL/uL — ABNORMAL LOW (ref 4.22–5.81)
RDW: 13.2 % (ref 11.5–15.5)
WBC: 7.6 10*3/uL (ref 4.0–10.5)

## 2012-11-02 LAB — PROTIME-INR
INR: 1.02 (ref 0.00–1.49)
Prothrombin Time: 13.3 seconds (ref 11.6–15.2)

## 2012-11-02 LAB — BASIC METABOLIC PANEL
BUN: 12 mg/dL (ref 6–23)
CO2: 29 mEq/L (ref 19–32)
Chloride: 101 mEq/L (ref 96–112)
Glucose, Bld: 192 mg/dL — ABNORMAL HIGH (ref 70–99)
Potassium: 3.8 mEq/L (ref 3.5–5.1)
Sodium: 137 mEq/L (ref 135–145)

## 2012-11-02 LAB — BASIC METABOLIC PANEL WITH GFR
Calcium: 9.9 mg/dL (ref 8.4–10.5)
Creatinine, Ser: 0.77 mg/dL (ref 0.50–1.35)
GFR calc Af Amer: >90 mL/min (ref >90)
GFR calc non Af Amer: >90 mL/min (ref >90)

## 2012-11-02 LAB — POCT I-STAT TROPONIN I: Troponin i, poc: 0 ng/mL (ref 0.00–0.08)

## 2012-11-02 LAB — PRO B NATRIURETIC PEPTIDE: Pro B Natriuretic peptide (BNP): 1633 pg/mL — ABNORMAL HIGH (ref 0–125)

## 2012-11-02 MED ORDER — FUROSEMIDE 20 MG PO TABS
40.0000 mg | ORAL_TABLET | Freq: Once | ORAL | Status: AC
  Administered 2012-11-02: 40 mg via ORAL
  Filled 2012-11-02: qty 2

## 2012-11-02 MED ORDER — FUROSEMIDE 40 MG PO TABS: 40.0000 mg | ORAL_TABLET | Freq: Every day | ORAL | Status: DC

## 2012-11-02 NOTE — Discharge Instructions (Signed)
 Allergies, Generic Allergies may happen from anything your body is sensitive to. This may be food, medicines, pollens, chemicals, and nearly anything around you in everyday life that produces allergens. An allergen is anything that causes an allergy producing substance. Heredity is often a factor in causing these problems. This means you may have some of the same allergies as your parents. Food allergies happen in all age groups. Food allergies are some of the most severe and life threatening. Some common food allergies are cow's milk, seafood, eggs, nuts, wheat, and soybeans. SYMPTOMS   Swelling around the mouth.  An itchy red rash or hives.  Vomiting or diarrhea.  Difficulty breathing. SEVERE ALLERGIC REACTIONS ARE LIFE-THREATENING. This reaction is called anaphylaxis. It can cause the mouth and throat to swell and cause difficulty with breathing and swallowing. In severe reactions only a trace amount of food (for example, peanut oil in a salad) may cause death within seconds. Seasonal allergies occur in all age groups. These are seasonal because they usually occur during the same season every year. They may be a reaction to molds, grass pollens, or tree pollens. Other causes of problems are house dust mite allergens, pet dander, and mold spores. The symptoms often consist of nasal congestion, a runny itchy nose associated with sneezing, and tearing itchy eyes. There is often an associated itching of the mouth and ears. The problems happen when you come in contact with pollens and other allergens. Allergens are the particles in the air that the body reacts to with an allergic reaction. This causes you to release allergic antibodies. Through a chain of events, these eventually cause you to release histamine into the blood stream. Although it is meant to be protective to the body, it is this release that causes your discomfort. This is why you were given anti-histamines to feel better. If you are  unable to pinpoint the offending allergen, it may be determined by skin or blood testing. Allergies cannot be cured but can be controlled with medicine. Hay fever is a collection of all or some of the seasonal allergy problems. It may often be treated with simple over-the-counter medicine such as diphenhydramine . Take medicine as directed. Do not drink alcohol or drive while taking this medicine. Check with your caregiver or package insert for child dosages. If these medicines are not effective, there are many new medicines your caregiver can prescribe. Stronger medicine such as nasal spray, eye drops, and corticosteroids may be used if the first things you try do not work well. Other treatments such as immunotherapy or desensitizing injections can be used if all else fails. Follow up with your caregiver if problems continue. These seasonal allergies are usually not life threatening. They are generally more of a nuisance that can often be handled using medicine. HOME CARE INSTRUCTIONS   If unsure what causes a reaction, keep a diary of foods eaten and symptoms that follow. Avoid foods that cause reactions.  If hives or rash are present:  Take medicine as directed.  You may use an over-the-counter antihistamine (diphenhydramine ) for hives and itching as needed.  Apply cold compresses (cloths) to the skin or take baths in cool water . Avoid hot baths or showers. Heat will make a rash and itching worse.  If you are severely allergic:  Following a treatment for a severe reaction, hospitalization is often required for closer follow-up.  Wear a medic-alert bracelet or necklace stating the allergy.  You and your family must learn how to give adrenaline or  use an anaphylaxis kit.  If you have had a severe reaction, always carry your anaphylaxis kit or EpiPen  with you. Use this medicine as directed by your caregiver if a severe reaction is occurring. Failure to do so could have a fatal outcome. SEEK  MEDICAL CARE IF:  You suspect a food allergy. Symptoms generally happen within 30 minutes of eating a food.  Your symptoms have not gone away within 2 days or are getting worse.  You develop new symptoms.  You want to retest yourself or your child with a food or drink you think causes an allergic reaction. Never do this if an anaphylactic reaction to that food or drink has happened before. Only do this under the care of a caregiver. SEEK IMMEDIATE MEDICAL CARE IF:   You have difficulty breathing, are wheezing, or have a tight feeling in your chest or throat.  You have a swollen mouth, or you have hives, swelling, or itching all over your body.  You have had a severe reaction that has responded to your anaphylaxis kit or an EpiPen . These reactions may return when the medicine has worn off. These reactions should be considered life threatening. MAKE SURE YOU:   Understand these instructions.  Will watch your condition.  Will get help right away if you are not doing well or get worse. Document Released: 09/20/2002 Document Revised: 09/19/2011 Document Reviewed: 02/25/2008 [facility] Patient Information 2013 [place], [place].    Heart Failure Heart failure (HF) is a condition in which the heart has trouble pumping blood. This means your heart does not pump blood efficiently for your body to work well. In some cases of HF, fluid may back up into your lungs or you may have swelling (edema) in your lower legs. HF is a long-term (chronic) condition. It is important for you to take good care of yourself and follow your caregiver's treatment plan. CAUSES   Health conditions:  High blood pressure (hypertension) causes the heart muscle to work harder than normal. When pressure in the blood vessels is high, the heart needs to pump (contract) with more force in order to circulate blood throughout the body. High blood pressure eventually causes the heart to become stiff and weak.  Coronary artery  disease (CAD) is the buildup of cholesterol and fat (plaques) in the arteries of the heart. The blockage in the arteries deprives the heart muscle of oxygen and blood. This can cause chest pain and may lead to a heart attack. High blood pressure can also contribute to CAD.  Heart attack (myocardial infarction) occurs when 1 or more arteries in the heart become blocked. The loss of oxygen damages the muscle tissue of the heart. When this happens, part of the heart muscle dies. The injured tissue does not contract as well and weakens the heart's ability to pump blood.  Abnormal heart valves can cause HF when the heart valves do not open and close properly. This makes the heart muscle pump harder to keep the blood flowing.  Heart muscle disease (cardiomyopathy or myocarditis) is damage to the heart muscle from a variety of causes. These can include drug or alcohol abuse, infections, or unknown reasons. These can increase the risk of HF.  Lung disease makes the heart work harder because the lungs do not work properly. This can cause a strain on the heart leading it to fail.  Diabetes increases the risk of HF. High blood sugar contributes to high fat (lipid) levels in the blood. Diabetes can also cause  slow damage to tiny blood vessels that carry important nutrients to the heart muscle. When the heart does not get enough oxygen and food, it can cause the heart to become weak and stiff. This leads to a heart that does not contract efficiently.  Other diseases can contribute to HF. These include abnormal heart rhythms, thyroid  problems, and low blood counts (anemia).  Unhealthy lifestyle habits:  Obesity.  Smoking.  Eating foods high in fat and cholesterol.  Eating or drinking beverages high in salt.  Drug or alcohol abuse.  Lack of exercise. SYMPTOMS  HF symptoms may vary and can be hard to detect. Symptoms may include:  Shortness of breath with activity, such as climbing  stairs.  Persistent cough.  Swelling of the feet, ankles, legs, or abdomen.  Unexplained weight gain.  Difficulty breathing when lying flat.  Waking from sleep because of the need to sit up and get more air.  Rapid heartbeat.  Fatigue and loss of energy.  Feeling lightheaded or close to fainting. DIAGNOSIS  A diagnosis of HF is based on your history, symptoms, physical examination, and diagnostic tests. Diagnostic tests for HF may include:  EKG.  Chest X-ray.  Blood tests.  Exercise stress test.  Blood oxygen test (arterial blood gas).  Evaluation by a heart doctor (cardiologist).  Ultrasound evaluation of the heart (echocardiogram).  Heart artery test to look for blockages (angiogram).  Radioactive imaging to look at the heart (radionuclide test). TREATMENT  Treatment is aimed at managing the symptoms of HF. Medicines, lifestyle changes, or surgical intervention may be necessary to treat HF.  Medicines to help treat HF may include:  Angiotensin-converting enzyme (ACE) inhibitors. These block the effects of a blood protein called angiotensin-converting enzyme. ACE inhibitors relax (dilate) the blood vessels and help lower blood pressure. This decreases the workload of the heart, slows the progression of HF, and improves symptoms.  Angiotensin receptor blockers (ARBs). These medications work similar to ACE inhibitors. ARBs may be an alternative for people who cannot tolerate an ACE inhibitor.  Aldosterone antagonists. This medication helps get rid of extra fluid from your body. This lowers the volume of blood the heart has to pump.  Water  pills (diuretics). Diuretics cause the kidneys to remove salt and water  from the blood. The extra fluid is removed by urination. By removing extra fluid from the body, diuretics help lower the workload of the heart and help prevent fluid buildup in the lungs so breathing is easier.  Beta blockers. These prevent the heart from  beating too fast and improve heart muscle strength. Beta blockers help maintain a normal heart rate, control blood pressure, and improve HF symptoms.  Digitalis. This increases the force of the heartbeat and may be helpful to people with HF or heart rhythm problems.  Healthy lifestyle changes include:  Stopping smoking.  Eating a healthy diet. Avoid foods high in fat. Avoid foods fried in oil or made with fat. A dietician can help with healthy food choices.  Limiting how much salt you eat.  Limiting alcohol intake to no more than 1 drink per day for women and 2 drinks per day for men. Drinking more than that is harmful to your heart. If your heart has already been damaged by alcohol or you have severe HF, drinking alcohol should be stopped completely.  Exercising as directed by your caregiver.  Surgical treatment for HF may include:  Procedures to open blocked arteries, repair damaged heart valves, or remove damaged heart muscle tissue.  A pacemaker to help heart muscle function and to control certain abnormal heart rhythms.  A defibrillator to possibly prevent sudden cardiac death. HOME CARE INSTRUCTIONS   Activity level. Your caregiver can help you determine what type of exercise program may be helpful. It is important to maintain your strength. Pace your physical activity to avoid shortness of breath or chest pain. Rest for 1 hour before and after meals. A cardiac rehabilitation program may be helpful to some people with HF.  Diet. Eat a heart healthy diet. Food choices should be low in saturated fat and cholesterol. Talk to a dietician to learn about heart healthy foods.  Salt intake. When you have HF, you need to limit the amount of salt you eat. Eat less than 1500 milligrams (mg) of salt per day or as recommended by your caregiver.  Weight monitoring. Weigh yourself every day. You should weigh yourself in the morning after you urinate and before you eat breakfast. Wear the same  amount of clothing each time you weigh yourself. Record your weight daily. Bring your recorded weights to your clinic visits. Tell your caregiver right away if you have gained 3 lb/1.4 kg in 1 day, or 5 lb/2.3 kg in a week or whatever amount you were told to report.  Blood pressure monitoring. This should be done as directed by your caregiver. A home blood pressure cuff can be purchased at a drugstore. Record your blood pressure numbers and bring them to your clinic visits. Tell your caregiver if you become dizzy or lightheaded upon standing up.  Smoking. If you are currently a smoker, it is time to quit. Nicotine makes your heart work harder by causing your blood vessels to constrict. Do not use nicotine gum or patches before talking to your caregiver.  Follow up. Be sure to schedule a follow-up visit with your caregiver. Keep all your appointments. SEEK MEDICAL CARE IF:   Your weight increases by 3 lb/1.4 kg in 1 day or 5 lb/2.3 kg in a week.  You notice increasing shortness of breath that is unusual for you. This may happen during rest, sleep, or with activity.  You cough more than normal, especially with physical activity.  You notice more swelling in your hands, feet, ankles, or belly (abdomen).  You are unable to sleep because it is hard to breathe.  You cough up bloody mucus (sputum).  You begin to feel jumping or fluttering sensations (palpitations) in your chest. SEEK IMMEDIATE MEDICAL CARE IF:   You have severe chest pain or pressure which may include symptoms such as:  Pain or pressure in the arms, neck, jaw, or back.  Feeling sweaty.  Feeling sick to your stomach (nauseous).  Feeling short of breath while at rest.  Having a fast or irregular heartbeat.  You experience stroke symptoms. These symptoms include:  Facial weakness or numbness.  Weakness or numbness in an arm, leg, or on one side of your body.  Blurred vision.  Difficulty talking or  thinking.  Dizziness or fainting.  Severe headache. MAKE SURE YOU:   Understand these instructions.  Will watch your condition.  Will get help right away if you are not doing well or get worse. Document Released: 06/27/2005 Document Revised: 12/27/2011 Document Reviewed: 10/09/2009 [facility] Patient Information 2013 [place], [place].

## 2012-11-02 NOTE — ED Provider Notes (Addendum)
History     CSN: [id number]  Arrival date & time 11/02/12  6213   First MD Initiated Contact with Patient 11/02/12 0920      Chief Complaint  Patient presents with  . URI  . Congestive Heart Failure    (Consider location/radiation/quality/duration/timing/severity/associated sxs/prior treatment) HPI Comments: Patient reports that he has a history of congestive heart failure, followed by heart failure clinic and Dr. Gala Romney reports about a month ago with his medication refills, he thinks Lasix was asked only left off and he has not been taking Lasix for the past 4 weeks. In addition approximately 2 weeks ago he developed either allergies or a upper respiratory tract infection with associated nasal congestion, dry, occasionally productive cough of white sputum. He reports sometimes his sputum is greenish. He denies any fevers, chills, body aches. He denies any significant chest pain. He reports over the last week, he's had increased and paroxysmal nocturnal dyspnea waking up every 2 hours either due to coughing or shortness of breath. He denies any significant change to his ability to ambulate or exert. He denies any significant increase in peripheral edema. He reports that his cough he is not too severe, he does not think that he requires any cough medication or antibiotics if I do not think it is indicated. His main worry was an exacerbation of congestive heart failure.  Patient is a 52 y.o. male presenting with URI and CHF. The history is provided by the patient and medical records.  URI Presenting symptoms: congestion, cough and rhinorrhea   Presenting symptoms: no fever   Congestive Heart Failure Associated symptoms include shortness of breath. Pertinent negatives include no chest pain.    Past Medical History  Diagnosis Date  . Paroxysmal ventricular tachycardia     s/p AICD '05, replaced w/ St. Jude's in 2010 (PVT/V.Fib arrest requiring ICD implant in 2005)  . HYPERTENSION,  UNSPECIFIED   . HYPERLIPIDEMIA-MIXED   . 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 Nonischemic cardiomyopathy     2/2 Adriamycin administration for lymphoma  . CHF (congestive heart failure)     EF 15% by echo 2009; s/p St. Jude ICD  . Diabetes mellitus     Type 2  . Cancer      non hodkins lymphoma 1992, Hodgkins 2007  . Depression   . LV (left ventricular) mural thrombus     2009, on coumadin  . Small bowel obstruction     s/p Small bowel resection 2001  . Jejunal intussusception     2008  . 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 Splenic mass     Noted on Abd [place] 2009 w/ recs for f/u CT - not done    Past Surgical History  Procedure Laterality Date  . Cardiac defibrillator placement      2005, replaced w/ St. Jude's 2010  . Vasectomy    . Bowel resection      small bowell 2/2 obstruction 2001  . Pacemaker insertion      Family History  Problem Relation Age of Onset  . Other      No known family h/o heart disease  . Diabetes Mother     History  Substance Use Topics  . Smoking status: Never Smoker   . Smokeless tobacco: Never Used  . Alcohol Use: No  Review of Systems  Constitutional: Negative for fever, chills, diaphoresis, activity change, appetite change and unexpected weight change.  HENT: Positive for congestion and rhinorrhea.   Respiratory: Positive for cough and shortness of breath.        + PND  Cardiovascular: Negative for chest pain and leg swelling.  Gastrointestinal: Negative for nausea and vomiting.  All other systems reviewed and are negative.    Allergies  Review of patient's allergies indicates no known allergies.  Home Medications   Current Outpatient Rx  Name  Route  Sig  Dispense  Refill  . aspirin 81 MG tablet   Oral   Take 81 mg by mouth daily.           . carvedilol (COREG)  3.125 MG tablet   Oral   Take 3.125 mg by mouth 2 (two) times daily with a meal.         . digoxin (LANOXIN) 0.125 MG tablet   Oral   Take 125 mcg by mouth daily.         [place] Kitchen docusate sodium (COLACE) 100 MG capsule   Oral   Take 100 mg by mouth daily.         . furosemide (LASIX) 40 MG tablet   Oral   Take 40 mg by mouth daily.         . insulin glargine (LANTUS) 100 UNIT/ML injection   Subcutaneous   Inject 30 Units into the skin daily.          [place] Kitchen lisinopril (PRINIVIL,ZESTRIL) 5 MG tablet   Oral   Take 1 tablet (5 mg total) by mouth daily.         . magnesium oxide (MAG-OX) 400 MG tablet   Oral   Take 400 mg by mouth daily.         [place] Kitchen omeprazole (PRILOSEC) 20 MG capsule   Oral   Take 40 mg by mouth daily.         Mar[place] Kitchen PARoxetine (PAXIL) 40 MG tablet   Oral   Take 20 mg by mouth daily.         . pravastatin (PRAVACHOL) 40 MG tablet   Oral   Take 40 mg by mouth daily.         . Rivaroxaban 20 MG TABS   Oral   Take 1 tablet by mouth daily.         . sertraline (ZOLOFT) 25 MG tablet   Oral   Take 25 mg by mouth daily.         [place] Kitchen spironolactone (ALDACTONE) 25 MG tablet   Oral   Take 25 mg by mouth daily.         Mar[place] Kitchen warfarin (COUMADIN) 5 MG tablet   Oral   Take 7.5-10 mg by mouth daily. 10 mg on Monday and 7.5 mg all other days         . furosemide (LASIX) 40 MG tablet   Oral   Take 1 tablet (40 mg total) by mouth daily.   30 tablet   0     BP 100/51  Pulse 82  Temp(Src) 97.8 F (36.6 C) (Oral)  Resp 23  Wt 178 lb (80.74 kg)  BMI 27.87 kg/m2  SpO2 99%  Physical Exam  Nursing note and vitals reviewed. Constitutional: He is oriented to person, place, and time. He appears well-developed and well-nourished.  HENT:  Head: Normocephalic and atraumatic.  Eyes: Conjunctivae and EOM are normal. Pupils are equal, round,  and reactive to light. No scleral icterus.  Neck: Neck supple.  Cardiovascular: Normal rate, regular rhythm and  intact distal pulses.   No murmur heard. Pulmonary/Chest: Effort normal. No respiratory distress. He has no wheezes.  Abdominal: Soft. He exhibits no distension. There is no tenderness. There is no rebound and no guarding.  Musculoskeletal: He exhibits no edema and no tenderness.  Neurological: He is alert and oriented to person, place, and time. Coordination normal.  Skin: Skin is warm and dry. No rash noted.    ED Course  Procedures (including critical care time)  Labs Reviewed  CBC - Abnormal; Notable for the following:    RBC 3.90 (*)    Hemoglobin 11.9 (*)    HCT 34.1 (*)    All other components within normal limits  BASIC METABOLIC PANEL - Abnormal; Notable for the following:    Glucose, Bld 192 (*)    All other components within normal limits  PRO B NATRIURETIC PEPTIDE - Abnormal; Notable for the following:    Pro B Natriuretic peptide (BNP) 1633.0 (*)    All other components within normal limits  PROTIME-INR  POCT I-STAT TROPONIN I   Dg Chest Port 1 View  11/02/2012  *RADIOLOGY REPORT*  Clinical Data: Productive cough  PORTABLE CHEST - 1 VIEW  Comparison: 12/18/2011  Findings: Moderate cardiomegaly.  Left subclavian AICD device and leads are intact and stable.  Lungs are clear.  Pulmonary vascularity is within normal limits.  Mild volume loss at the lung bases.  IMPRESSION: Cardiomegaly without edema.   Original Report Authenticated By: Jolaine Click, M.D.    I reviewed the CXR above myself  1. CHF (congestive heart failure)   2. Seasonal allergies   3. Cough     Room air saturations are 99% and I interpret this to be normal  ECG at time 08:56 shows SR with first degree AV block at rate 76, LAD, left atrial enlargement, poor r wave progression leads V2-V4.  No sig change from ECG on December 17, 2011.    MDM  Pt with h/o CHF, inadvertant non compliance with lasix, noramlly takes 40 mg daily for past 4 weeks.  Also allergies versus mild URI.  Given congestion, cough has been  present for 2 weeks, no fever, not toxic appearing, no pneumonia on CXR, I favor allergies over infection.  Pt is agreeable not to take anything for allergies or cough citing the fact he is already on so many meds.  Pt given PO lasix here as BNP is not too elevated, no sig fluid seen on CXR.  Troponin here is neg.  ECG shows no new ischemic changes and pt had no CP, diaphoresis, nausea to suggest ACS.  I think pt can contact [facility] and follow up in CHF clinic sooner and will provide a Rx for lasix here that pt can take at home.  Pt is mildly anemic at 11.9, can be rechecked and followed as outpt.  Doubt that is contributing to pt's cough and CHR symptoms, may be a result of it.        Gavin Pound. Oletta Lamas, MD 11/02/12 1119  Gavin Pound. Trimaine Maser, MD 11/02/12 1128

## 2012-11-02 NOTE — ED Notes (Signed)
Per pt sts hasn't been taking his lasix and feels like he has fluid on his chest. sts also having some cold symptoms. sts hasn't had in 30 days. sts productive cough with green sputum.

## 2012-11-07 ENCOUNTER — Ambulatory Visit (HOSPITAL_COMMUNITY)
Admission: RE | Admit: 2012-11-07 | Discharge: 2012-11-07 | Disposition: A | Discharge status: Home / Self Care | Payer: Medicare Other | Source: Ambulatory Visit | Attending: Internal Medicine | Admitting: Internal Medicine

## 2012-11-07 ENCOUNTER — Encounter (HOSPITAL_COMMUNITY): Payer: Self-pay

## 2012-11-07 VITALS — BP 90/56 | HR 82 | Wt 175.0 lb

## 2012-11-07 DIAGNOSIS — I5022 Chronic systolic (congestive) heart failure: Secondary | ICD-10-CM | POA: Insufficient documentation

## 2012-11-07 DIAGNOSIS — I509 Heart failure, unspecified: Secondary | ICD-10-CM | POA: Insufficient documentation

## 2012-11-07 DIAGNOSIS — I10 Essential (primary) hypertension: Secondary | ICD-10-CM | POA: Insufficient documentation

## 2012-11-07 DIAGNOSIS — Z8673 Personal history of transient ischemic attack (TIA), and cerebral infarction without residual deficits: Secondary | ICD-10-CM | POA: Insufficient documentation

## 2012-11-07 LAB — BASIC METABOLIC PANEL
BUN: 21 mg/dL (ref 6–23)
CO2: 29 mEq/L (ref 19–32)
Calcium: 9.7 mg/dL (ref 8.4–10.5)
Creatinine, Ser: 0.88 mg/dL (ref 0.50–1.35)

## 2012-11-07 NOTE — Assessment & Plan Note (Signed)
NYHA II. Volume status stable. Continue current diuretic regimen. BP soft will not titrate HF medications. Reinforced daily weights, medication compliance, low salt food choices, and limiting fluid intake to < 2 liters per day. I have asked him to bring all medications to next appointment. Check BMET. Follow up in 4 weeks with an ECHO.

## 2012-11-07 NOTE — Patient Instructions (Addendum)
Follow up in 3 weeks with an ECHO and Dr Bensimhon  Do the following things EVERYDAY: 1) Weigh yourself in the morning before breakfast. Write it down and keep it in a log. 2) Take your medicines as prescribed 3) Eat low salt foods-Limit salt (sodium) to 2000 mg per day.  4) Stay as active as you can everyday 5) Limit all fluids for the day to less than 2 liters 

## 2012-11-07 NOTE — Progress Notes (Signed)
Patient ID: Andre Tran, male   DOB: Jun 16, 1961, 52 y.o.   MRN: [id number]  Physicians EP : Dr Ladona Ridgel [name]  HPI: 52 y.o. male w/ PMHx significant for chronic systolic CHF 2/2 NonIschemic CM, Paroxysmal VT s/p SJM ICD, LV Thrombus (on coumadin), and CVA '08.  He has a h/o severe cardiomyopathy 2/2 Adriamycin administration for lymphoma and PVT/V.Fib arrest requiring ICD implant in 2005. Had CVA in 2008 with residual right-sided weakness. LV thrombus in 2009 and initiated on coumadin. Recurrent VT in 2009 and initiated on sotalol but this was stopped (by [place] doctor Dr .Jonny Ruiz?) Elective replacement with St. Jude's ICD in 2010.   12/16/11 ECHO EF 15% global HK. No apical thrombus.   Evaluated in [facility] ED 11/02/12 due to dyspnea and later discharged. He had been out of lasix for 1 month. He was restarted on lasix 40 mg daily. CEs negative. Pro BNP 1633.   He is here for follow up after ED evaluation. Denies SOB/PND/Orthopnea/dizziness. Weight at home 170-173 pounds.  No shocks. Drinks < 2 liters of fluid per day. Complaint with medications.  He says his wife helps him with medications. Currently has all medications.   ROS: All systems negative except as listed in HPI, PMH and Problem List.  Past Medical History  Diagnosis Date  . Paroxysmal ventricular tachycardia     s/p AICD '05, replaced w/ St. Jude's in 2010 (PVT/V.Fib arrest requiring ICD implant in 2005)  . HYPERTENSION, UNSPECIFIED   . HYPERLIPIDEMIA-MIXED   . 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 Nonischemic cardiomyopathy     2/2 Adriamycin administration for lymphoma  . CHF (congestive heart failure)     EF 15% by echo 2009; s/p St. Jude ICD  . Diabetes mellitus     Type 2  . Cancer      non hodkins lymphoma 1992, Hodgkins 2007  . Depression   . LV (left ventricular) mural thrombus     2009, on coumadin  . Small  bowel obstruction     s/p Small bowel resection 2001  . Jejunal intussusception     2008  . 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 Splenic mass     Noted on Abd [place] 2009 w/ recs for f/u CT - not done    Current Outpatient Prescriptions  Medication Sig Dispense Refill  . aspirin 81 MG tablet Take 81 mg by mouth daily.        . carvedilol (COREG) 3.125 MG tablet Take 3.125 mg by mouth 2 (two) times daily with a meal.      . digoxin (LANOXIN) 0.125 MG tablet Take 125 mcg by mouth daily.      Marland Kitchen docusate sodium (COLACE) 100 MG capsule Take 100 mg by mouth daily.      . furosemide (LASIX) 40 MG tablet Take 1 tablet (40 mg total) by mouth daily.  30 tablet  0  . insulin glargine (LANTUS) 100 UNIT/ML injection Inject 30 Units into the skin daily.       Marland Kitchen lisinopril (PRINIVIL,ZESTRIL) 5 MG tablet Take 5 mg by mouth 2 (two) times daily.      . magnesium oxide (MAG-OX) 400 MG tablet Take 400 mg by mouth daily.      Marland Kitchen PARoxetine (PAXIL) 40 MG tablet Take 20 mg by mouth daily.      Marland Kitchen  pravastatin (PRAVACHOL) 40 MG tablet Take 40 mg by mouth daily.      . Rivaroxaban 20 MG TABS Take 1 tablet by mouth daily.      . sertraline (ZOLOFT) 25 MG tablet Take 25 mg by mouth daily.      Marland Kitchen spironolactone (ALDACTONE) 25 MG tablet Take 25 mg by mouth daily.       No current facility-administered medications for this encounter.     PHYSICAL EXAM: Filed Vitals:   11/07/12 1610  BP: 90/56  Pulse: 82  Weight: 175 lb (79.379 kg)  SpO2: 99%    General:  Well appearing. No resp difficulty HEENT: normal Neck: supple. JVP flat. Carotids 2+ bilaterally; no bruits. No lymphadenopathy or thryomegaly appreciated. Cor: PMI normal. Regular rate & rhythm. No rubs, gallops or murmurs. Lungs: clear Abdomen: soft, nontender, nondistended. No hepatosplenomegaly. No bruits or masses. Good bowel sounds. Extremities: no cyanosis, clubbing, rash, edema Neuro: alert & orientedx3, cranial  nerves grossly intact. Moves all 4 extremities w/o difficulty. Affect pleasant.     ASSESSMENT & PLAN:

## 2012-11-12 ENCOUNTER — Encounter: Payer: Medicare Other | Admitting: *Deleted

## 2012-11-13 ENCOUNTER — Encounter: Payer: Self-pay | Admitting: *Deleted

## 2012-12-04 ENCOUNTER — Encounter (HOSPITAL_COMMUNITY): Payer: Self-pay

## 2012-12-04 ENCOUNTER — Ambulatory Visit (HOSPITAL_COMMUNITY)
Admission: RE | Admit: 2012-12-04 | Discharge: 2012-12-04 | Disposition: A | Discharge status: Home / Self Care | Payer: Medicare Other | Source: Ambulatory Visit | Attending: Internal Medicine | Admitting: Internal Medicine

## 2012-12-04 ENCOUNTER — Ambulatory Visit (HOSPITAL_BASED_OUTPATIENT_CLINIC_OR_DEPARTMENT_OTHER)
Admission: RE | Admit: 2012-12-04 | Discharge: 2012-12-04 | Disposition: A | Discharge status: Home / Self Care | Payer: Medicare Other | Source: Ambulatory Visit | Attending: Internal Medicine | Admitting: Internal Medicine

## 2012-12-04 VITALS — BP 98/70 | HR 83 | Wt 172.8 lb

## 2012-12-04 DIAGNOSIS — Z8673 Personal history of transient ischemic attack (TIA), and cerebral infarction without residual deficits: Secondary | ICD-10-CM | POA: Insufficient documentation

## 2012-12-04 DIAGNOSIS — I428 Other cardiomyopathies: Secondary | ICD-10-CM | POA: Insufficient documentation

## 2012-12-04 DIAGNOSIS — Z87898 Personal history of other specified conditions: Secondary | ICD-10-CM | POA: Insufficient documentation

## 2012-12-04 DIAGNOSIS — I5022 Chronic systolic (congestive) heart failure: Secondary | ICD-10-CM

## 2012-12-04 DIAGNOSIS — I509 Heart failure, unspecified: Secondary | ICD-10-CM | POA: Insufficient documentation

## 2012-12-04 DIAGNOSIS — I319 Disease of pericardium, unspecified: Secondary | ICD-10-CM

## 2012-12-04 DIAGNOSIS — E119 Type 2 diabetes mellitus without complications: Secondary | ICD-10-CM | POA: Insufficient documentation

## 2012-12-04 MED ORDER — CARVEDILOL 3.125 MG PO TABS: ORAL_TABLET | ORAL | Status: DC

## 2012-12-04 MED ORDER — CARVEDILOL 6.25 MG PO TABS: ORAL_TABLET | ORAL | Status: DC

## 2012-12-04 NOTE — Assessment & Plan Note (Signed)
I have reviewed his echo personally. He has persistent, severe LV dysfunction with EF 15%. However he feels pretty good with NYHA II symptoms. Volume status just mildly elevated. BP is soft. We considered restarting spiro but he did not tolerate this in past. Will have him take extra dose of lasix today. Reinforced need for daily weights and reviewed use of sliding scale diuretics. Increase pm carvedilol to 6.25.   Overall I remained very concerned about his HF as he has several high-risk features and I feel he may be under reporting his symptoms. I suspect he may need advanced therapies. However we will need to have him see oncology first and make sure he is truly in remission/cured from his lymphoma. [facility]. Will also schedule CPX testing. See back 1 month.

## 2012-12-04 NOTE — Progress Notes (Signed)
Patient ID: Andre Tran, male   DOB: December 12, 1960, 52 y.o.   MRN: [id number]  Physicians EP : Dr Ladona Ridgel [name] Oncologist: Has not followed up since being in [place]  HPI: 52 y.o. male w/ PMHx significant for chronic systolic CHF 2/2 probable chemotherapy-induced (adriamycin) CM EF 15%, h/o VT/VF s/p SJM ICD (reoplaced in 2010), LV Thrombus (on coumadin), and CVA '08.  He has h/o recurrent lymphoma (1993, 2007) treated with chemo (including adriamycin) - details unclear.  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   12/16/11 ECHO EF 15% global HK. No apical thrombus.  12/04/12 ECHO EF 15% RV mildly hypokinetic (reviewed personally)  Evaluated in [facility] ED 11/02/12 due to dyspnea and later discharged. He had been out of lasix for 1 month. He was restarted on lasix 40 mg daily. CEs negative. Pro BNP 1633.   He returns for follow up. Denies SOB/PND/Orthopnea. Says he can walk up a few flights of steps unless he tries to go to fast then has to stop. Denies dizziness. No shocks. Weight at home 168 pounds. Compliant with medications. He has not had follow up for lymphoma in the last 4 years. Last ab u/s in 2009 mentions ? Of splenic mass - has not been followed up.   He has been on Spironolactone but it was stopped due to hypotension.    ROS: All systems negative except as listed in HPI, PMH and Problem List.  Past Medical History  Diagnosis Date  . Paroxysmal ventricular tachycardia     s/p AICD '05, replaced w/ St. Jude's in 2010 (PVT/V.Fib arrest requiring ICD implant in 2005)  . HYPERTENSION, UNSPECIFIED   . HYPERLIPIDEMIA-MIXED   . 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 Nonischemic cardiomyopathy     2/2 Adriamycin administration for lymphoma  . CHF (congestive heart failure)     EF 15% by echo 2009; s/p St. Jude ICD  . Diabetes mellitus     Type 2  . Cancer      non  hodkins lymphoma 1992, Hodgkins 2007  . Depression   . LV (left ventricular) mural thrombus     2009, on coumadin  . Small bowel obstruction     s/p Small bowel resection 2001  . Jejunal intussusception     2008  . 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 Splenic mass     Noted on Abd [place] 2009 w/ recs for f/u CT - not done    Current Outpatient Prescriptions  Medication Sig Dispense Refill  . aspirin 81 MG tablet Take 81 mg by mouth daily.        . carvedilol (COREG) 6.25 MG tablet Take 3.125 mg in am and 6.25 mg in pm  60 tablet  3  . digoxin (LANOXIN) 0.125 MG tablet Take 125 mcg by mouth daily.      Marland Kitchen docusate sodium (COLACE) 100 MG capsule Take 100 mg by mouth daily.      . furosemide (LASIX) 40 MG tablet Take 1 tablet (40 mg total) by mouth daily.  30 tablet  0  . insulin glargine (LANTUS) 100 UNIT/ML injection Inject 30 Units into the skin daily.       Marland Kitchen lisinopril (PRINIVIL,ZESTRIL) 5 MG tablet Take 5 mg by mouth 2 (two) times daily.      Marland Kitchen  magnesium oxide (MAG-OX) 400 MG tablet Take 400 mg by mouth daily.      Marland Kitchen PARoxetine (PAXIL) 40 MG tablet Take 20 mg by mouth daily.      . pravastatin (PRAVACHOL) 40 MG tablet Take 40 mg by mouth daily.      . Rivaroxaban 20 MG TABS Take 1 tablet by mouth daily.      . sertraline (ZOLOFT) 25 MG tablet Take 25 mg by mouth daily.      Marland Kitchen spironolactone (ALDACTONE) 25 MG tablet Take 25 mg by mouth daily.       No current facility-administered medications for this encounter.     PHYSICAL EXAM: Filed Vitals:   12/04/12 1158  BP: 98/70  Pulse: 83  Weight: 172 lb 12.8 oz (78.382 kg)  SpO2: 100%    General:  Well appearing. No resp difficulty HEENT: normal Neck: supple. JVP 8. Carotids 2+ bilaterally; no bruits. No lymphadenopathy or thryomegaly appreciated. Cor: PMI normal. Regular rate & rhythm. No rubs, gallops or murmurs. Lungs: clear Abdomen: soft, nontender, nondistended. No hepatosplenomegaly.  No bruits or masses. Good bowel sounds. Extremities: no cyanosis, clubbing, rash, edema Neuro: alert & orientedx3, cranial nerves grossly intact. Moves all 4 extremities w/o difficulty. Affect pleasant.     ASSESSMENT & PLAN:

## 2012-12-04 NOTE — Patient Instructions (Addendum)
Take carvedilol 3.125 mg in am (1/2 tablet) and 6.25 mg in pm (1 tablet)  Take an extra lasix today  Follow up in 1 month  Do the following things EVERYDAY: 1) Weigh yourself in the morning before breakfast. Write it down and keep it in a log. 2) Take your medicines as prescribed 3) Eat low salt foods-Limit salt (sodium) to 2000 mg per day.  4) Stay as active as you can everyday 5) Limit all fluids for the day to less than 2 liters

## 2012-12-04 NOTE — Progress Notes (Signed)
Echocardiogram 2D Echocardiogram has been performed.  Andre Tran 12/04/2012, 3:40 PM

## 2012-12-06 ENCOUNTER — Telehealth: Payer: Self-pay | Admitting: Oncology

## 2012-12-06 NOTE — Telephone Encounter (Signed)
C/D 12/06/12 for appt. 01/16/13

## 2012-12-06 NOTE — Telephone Encounter (Signed)
S/W PT IN RE NP APPT 07/09 @ 10:30 W/DR. SHADAD REFERRING DR. Gala Romney DX- HX OF LYMPHOMA WELCOME PACKET MAILED.  WIFE HAS ALL RECORDS CD.

## 2012-12-12 ENCOUNTER — Ambulatory Visit (HOSPITAL_COMMUNITY): Payer: Medicare Other | Attending: Internal Medicine

## 2012-12-12 DIAGNOSIS — R0609 Other forms of dyspnea: Secondary | ICD-10-CM

## 2012-12-12 DIAGNOSIS — I509 Heart failure, unspecified: Secondary | ICD-10-CM | POA: Insufficient documentation

## 2012-12-12 DIAGNOSIS — I5022 Chronic systolic (congestive) heart failure: Secondary | ICD-10-CM

## 2012-12-12 DIAGNOSIS — R0989 Other specified symptoms and signs involving the circulatory and respiratory systems: Secondary | ICD-10-CM

## 2013-01-03 ENCOUNTER — Encounter (HOSPITAL_COMMUNITY): Payer: Self-pay

## 2013-01-03 ENCOUNTER — Ambulatory Visit (HOSPITAL_COMMUNITY)
Admission: RE | Admit: 2013-01-03 | Discharge: 2013-01-03 | Disposition: A | Discharge status: Home / Self Care | Payer: Medicare Other | Source: Ambulatory Visit | Attending: Internal Medicine | Admitting: Internal Medicine

## 2013-01-03 VITALS — BP 100/60 | HR 91 | Wt 172.8 lb

## 2013-01-03 DIAGNOSIS — I5022 Chronic systolic (congestive) heart failure: Secondary | ICD-10-CM | POA: Insufficient documentation

## 2013-01-03 MED ORDER — CARVEDILOL 6.25 MG PO TABS: 6.2500 mg | ORAL_TABLET | Freq: Two times a day (BID) | ORAL | Status: DC

## 2013-01-03 NOTE — Progress Notes (Signed)
Patient ID: Andre Tran, male   DOB: [birth year], 52 y.o.   MRN: [id number]  Physicians EP : Andre Tran [place]: Andre Tran Oncologist: Has not followed up since being in [place]  HPI: 52 y.o. male w/ PMHx significant for chronic systolic CHF 2/2 probable chemotherapy-induced (adriamycin) CM EF 15%, h/o VT/VF s/p SJM ICD (reoplaced in 2010), LV Thrombus (on coumadin), and CVA '08.  He has h/o recurrent lymphoma (1993, 2007) treated with chemo (including adriamycin) - details unclear.  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   12/16/11 ECHO EF 15% global HK. No apical thrombus.  12/04/12 ECHO EF 15% RV mildly hypokinetic (reviewed personally)  Evaluated in [facility] ED 11/02/12 due to dyspnea and later discharged. He had been out of lasix for 1 month. He was restarted on lasix 40 mg daily. CEs negative. Pro BNP 1633.   12/12/12 CPX Peak VO2: 16.9 ml/kg/min % predicted peak VO2: 46.9% VE/VCO2 slope: 33.3 OUES: 1.36 Peak RER: 1.19 Moderate to severe functional limitation due to heart failure.   He returns for follow up. Last visit night time carvedilol was increased to 6.25 mg. Denies SOB/PND/Orthopnea/dizziness. Able to walk up steps. Compliant with medications. Weight at home 163 pounds. He will follow up with Andre Andre Tran July 9th regarding his lymphoma. He has not had any oncology follow up since 2009.    He has been on Spironolactone but it was stopped due to hypotension.    ROS: All systems negative except as listed in HPI, PMH and Problem List.  Past Medical History  Diagnosis Date  . Paroxysmal ventricular tachycardia     s/p AICD '05, replaced w/ St. Jude's in 2010 (PVT/V.Fib arrest requiring ICD implant in 2005)  . HYPERTENSION, UNSPECIFIED   . HYPERLIPIDEMIA-MIXED   . 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 Nonischemic cardiomyopathy     2/2 Adriamycin administration for  lymphoma  . CHF (congestive heart failure)     EF 15% by echo 2009; s/p St. Jude ICD  . Diabetes mellitus     Type 2  . Cancer      non hodkins lymphoma 1992, Hodgkins 2007  . Depression   . LV (left ventricular) mural thrombus     2009, on coumadin  . Small bowel obstruction     s/p Small bowel resection 2001  . Jejunal intussusception     2008  . 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 Splenic mass     Noted on Abd [place] 2009 w/ recs for f/u CT - not done    Current Outpatient Prescriptions  Medication Sig Dispense Refill  . aspirin 81 MG tablet Take 81 mg by mouth daily.        . carvedilol (COREG) 6.25 MG tablet Take 3.125 mg in am and 6.25 mg in pm  60 tablet  3  . digoxin (LANOXIN) 0.125 MG tablet Take 125 mcg by mouth daily.      Marland Kitchen docusate sodium (COLACE) 100 MG capsule Take 100 mg by mouth daily.      . furosemide (LASIX) 40 MG tablet Take 1 tablet (40 mg total) by mouth daily.  30 tablet  0  . insulin glargine (LANTUS) 100 UNIT/ML injection Inject 30 Units into the skin daily.       Marland Kitchen lisinopril (PRINIVIL,ZESTRIL) 5 MG  tablet Take 5 mg by mouth 2 (two) times daily.      . magnesium oxide (MAG-OX) 400 MG tablet Take 400 mg by mouth daily.      Marland Kitchen PARoxetine (PAXIL) 40 MG tablet Take 20 mg by mouth daily.      . pravastatin (PRAVACHOL) 40 MG tablet Take 40 mg by mouth daily.      . Rivaroxaban 20 MG TABS Take 1 tablet by mouth daily.      . sertraline (ZOLOFT) 25 MG tablet Take 25 mg by mouth daily.      Marland Kitchen spironolactone (ALDACTONE) 25 MG tablet Take 25 mg by mouth daily.       No current facility-administered medications for this encounter.     PHYSICAL EXAM: Filed Vitals:   01/03/13 1054  BP: 100/60  Pulse: 91  Weight: 172 lb 12.8 oz (78.382 kg)  SpO2: 99%    General:  Well appearing. No resp difficulty HEENT: normal Neck: supple. JVP 5-6 Carotids 2+ bilaterally; no bruits. No lymphadenopathy or thryomegaly appreciated. Cor:  PMI normal. Regular rate & rhythm. No rubs, gallops or murmurs. Lungs: clear Abdomen: soft, nontender, nondistended. No hepatosplenomegaly. No bruits or masses. Good bowel sounds. Extremities: no cyanosis, clubbing, rash, edema Neuro: alert & orientedx3, cranial nerves grossly intact. Moves all 4 extremities w/o difficulty. Affect pleasant.     ASSESSMENT & PLAN:

## 2013-01-03 NOTE — Assessment & Plan Note (Addendum)
NYHA II. Volume status stable. Continue current diuretic regimen. Increase day time carvedilol to 6.25 mg and continue night time carvedilol to 6.25 mg. Briefly discussed CPX with moderate to severe functional limitation due to heart failure. He is scheduled to be seen at the [facility] July 7 regarding lymphoma. Would not consider advanced therapies until then and only if he remains in remission or is cured. Reinforced daily weights, low salt food choices, limiting fluid intake to < 2 liters per day and medications compliance. Follow up in 1 month. Marland Kitchen

## 2013-01-03 NOTE — Patient Instructions (Addendum)
Follow up in 1 month with Dr Gala Romney  Take carvedilol 6.25 mg (1 tablet) in am and 6.25 mg (1 tablet) in pm   Do the following things EVERYDAY: 1) Weigh yourself in the morning before breakfast. Write it down and keep it in a log. 2) Take your medicines as prescribed 3) Eat low salt foods-Limit salt (sodium) to 2000 mg per day.  4) Stay as active as you can everyday 5) Limit all fluids for the day to less than 2 liters

## 2013-01-06 ENCOUNTER — Emergency Department (HOSPITAL_COMMUNITY)
Admission: EM | Admit: 2013-01-06 | Discharge: 2013-01-06 | Disposition: A | Discharge status: Home / Self Care | Payer: Medicare Other | Attending: Emergency Medicine | Admitting: Emergency Medicine

## 2013-01-06 DIAGNOSIS — I252 Old myocardial infarction: Secondary | ICD-10-CM | POA: Insufficient documentation

## 2013-01-06 DIAGNOSIS — E119 Type 2 diabetes mellitus without complications: Secondary | ICD-10-CM | POA: Insufficient documentation

## 2013-01-06 DIAGNOSIS — Z794 Long term (current) use of insulin: Secondary | ICD-10-CM | POA: Insufficient documentation

## 2013-01-06 DIAGNOSIS — Z7982 Long term (current) use of aspirin: Secondary | ICD-10-CM | POA: Insufficient documentation

## 2013-01-06 DIAGNOSIS — Z86718 Personal history of other venous thrombosis and embolism: Secondary | ICD-10-CM | POA: Insufficient documentation

## 2013-01-06 DIAGNOSIS — Z8719 Personal history of other diseases of the digestive system: Secondary | ICD-10-CM | POA: Insufficient documentation

## 2013-01-06 DIAGNOSIS — F3289 Other specified depressive episodes: Secondary | ICD-10-CM | POA: Insufficient documentation

## 2013-01-06 DIAGNOSIS — J029 Acute pharyngitis, unspecified: Secondary | ICD-10-CM | POA: Insufficient documentation

## 2013-01-06 DIAGNOSIS — Z87898 Personal history of other specified conditions: Secondary | ICD-10-CM | POA: Insufficient documentation

## 2013-01-06 DIAGNOSIS — Z8571 Personal history of Hodgkin lymphoma: Secondary | ICD-10-CM | POA: Insufficient documentation

## 2013-01-06 DIAGNOSIS — R1310 Dysphagia, unspecified: Secondary | ICD-10-CM | POA: Insufficient documentation

## 2013-01-06 DIAGNOSIS — Z7901 Long term (current) use of anticoagulants: Secondary | ICD-10-CM | POA: Insufficient documentation

## 2013-01-06 DIAGNOSIS — F329 Major depressive disorder, single episode, unspecified: Secondary | ICD-10-CM | POA: Insufficient documentation

## 2013-01-06 DIAGNOSIS — E785 Hyperlipidemia, unspecified: Secondary | ICD-10-CM | POA: Insufficient documentation

## 2013-01-06 DIAGNOSIS — R110 Nausea: Secondary | ICD-10-CM | POA: Insufficient documentation

## 2013-01-06 DIAGNOSIS — R471 Dysarthria and anarthria: Secondary | ICD-10-CM | POA: Insufficient documentation

## 2013-01-06 DIAGNOSIS — Z8673 Personal history of transient ischemic attack (TIA), and cerebral infarction without residual deficits: Secondary | ICD-10-CM | POA: Insufficient documentation

## 2013-01-06 DIAGNOSIS — Z95810 Presence of automatic (implantable) cardiac defibrillator: Secondary | ICD-10-CM | POA: Insufficient documentation

## 2013-01-06 DIAGNOSIS — I509 Heart failure, unspecified: Secondary | ICD-10-CM | POA: Insufficient documentation

## 2013-01-06 DIAGNOSIS — Z79899 Other long term (current) drug therapy: Secondary | ICD-10-CM | POA: Insufficient documentation

## 2013-01-06 DIAGNOSIS — Z8679 Personal history of other diseases of the circulatory system: Secondary | ICD-10-CM | POA: Insufficient documentation

## 2013-01-06 DIAGNOSIS — I10 Essential (primary) hypertension: Secondary | ICD-10-CM | POA: Insufficient documentation

## 2013-01-06 LAB — RAPID STREP SCREEN (MED CTR MEBANE ONLY): Streptococcus, Group A Screen (Direct): NEGATIVE

## 2013-01-06 MED ORDER — ONDANSETRON 8 MG PO TBDP
8.0000 mg | ORAL_TABLET | Freq: Once | ORAL | Status: AC
  Administered 2013-01-06: 8 mg via ORAL
  Filled 2013-01-06: qty 1

## 2013-01-06 MED ORDER — OXYCODONE HCL 5 MG/5ML PO SOLN
5.0000 mg | Freq: Once | ORAL | Status: AC
  Administered 2013-01-06: 5 mg via ORAL
  Filled 2013-01-06: qty 5

## 2013-01-06 MED ORDER — PROMETHAZINE HCL 25 MG PO TABS: 25.0000 mg | ORAL_TABLET | Freq: Four times a day (QID) | ORAL | Status: DC | PRN

## 2013-01-06 MED ORDER — OXYCODONE-ACETAMINOPHEN 5-325 MG/5ML PO SOLN: 5.0000 mL | ORAL | Status: DC | PRN

## 2013-01-06 NOTE — Discharge Instructions (Signed)
Salt Water Gargle °This solution will help make your mouth and throat feel better. °HOME CARE INSTRUCTIONS  °· Mix 1 teaspoon of salt in 8 ounces of warm water. °· Gargle with this solution as much or often as you need or as directed. Swish and gargle gently if you have any sores or wounds in your mouth. °· Do not swallow this mixture. °Document Released: 03/31/2004 Document Revised: 09/19/2011 Document Reviewed: 08/22/2008 °[employer]® Patient Information ©2014 [employer]. ° °Sore Throat °A sore throat is pain, burning, irritation, or scratchiness of the throat. There is often pain or tenderness when swallowing or talking. A sore throat may be accompanied by other symptoms, such as coughing, sneezing, fever, and swollen neck glands. A sore throat is often the first sign of another sickness, such as a cold, flu, strep throat, or mononucleosis (commonly known as mono). Most sore throats go away without medical treatment. °CAUSES  °The most common causes of a sore throat include: °· A viral infection, such as a cold, flu, or mono. °· A bacterial infection, such as strep throat, tonsillitis, or whooping cough. °· Seasonal allergies. °· Dryness in the air. °· Irritants, such as smoke or pollution. °· Gastroesophageal reflux disease (GERD). °HOME CARE INSTRUCTIONS  °· Only take over-the-counter medicines as directed by your caregiver. °· Drink enough fluids to keep your urine clear or pale yellow. °· Rest as needed. °· Try using throat sprays, lozenges, or sucking on hard candy to ease any pain (if older than 4 years or as directed). °· Sip warm liquids, such as broth, herbal tea, or warm water with honey to relieve pain temporarily. You may also eat or drink cold or frozen liquids such as frozen ice pops. °· Gargle with salt water (mix 1 tsp salt with 8 oz of water). °· Do not smoke and avoid secondhand smoke. °· Put a cool-mist humidifier in your bedroom at night to moisten the air. You can also turn on a hot shower  and sit in the bathroom with the door closed for 5 10 minutes. °SEEK IMMEDIATE MEDICAL CARE IF: °· You have difficulty breathing. °· You are unable to swallow fluids, soft foods, or your saliva. °· You have increased swelling in the throat. °· Your sore throat does not get better in 7 days. °· You have nausea and vomiting. °· You have a fever or persistent symptoms for more than 2 3 days. °· You have a fever and your symptoms suddenly get worse. °MAKE SURE YOU:  °· Understand these instructions. °· Will watch your condition. °· Will get help right away if you are not doing well or get worse. °Document Released: 08/04/2004 Document Revised: 06/13/2012 Document Reviewed: 03/04/2012 °ExitCare® Patient Information ©2014 [employer]. ° °

## 2013-01-06 NOTE — ED Provider Notes (Signed)
History    This chart was scribed for non-physician practitioner, Andre Bellman, PA-C, working with Andre Quarry, MD by Andre Tran, ED Scribe. This patient was seen in room [facility]TR3/[facility]PT3 and the patient's care was started at 9:37PM.  CSN: [id number] Arrival date & time 01/06/13  2114  First MD Initiated Contact with Patient 01/06/13 2136     Chief Complaint  Patient presents with  . Sore Throat   (Consider location/radiation/quality/duration/timing/severity/associated sxs/prior Treatment) The history is provided by the patient and the spouse. No language interpreter was used.   HPI Comments: Andre Tran is a 52 y.o. male who presents to the Emergency Department complaining of constant, moderate to severe sore throat with some dysphagia and dysarthria secondary to pain with an onset yesterday at 4:00PM that has gotten progressively worse since onset. He rates the severity of the pain a 10/10 when swallowing and a 6-7/10 when not swallowing. Swallowing aggravates the pain. He has not taken any pain medications at home. Hx of DM. No known allergies. No other pertinent medical symptoms.  Past Medical History  Diagnosis Date  . Paroxysmal ventricular tachycardia     s/p AICD '05, replaced w/ St. Jude's in 2010 (PVT/V.Fib arrest requiring ICD implant in 2005)  . HYPERTENSION, UNSPECIFIED   . HYPERLIPIDEMIA-MIXED   . 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 Nonischemic cardiomyopathy     2/2 Adriamycin administration for lymphoma  . CHF (congestive heart failure)     EF 15% by echo 2009; s/p St. Jude ICD  . Diabetes mellitus     Type 2  . Cancer      non hodkins lymphoma 1992, Hodgkins 2007  . Depression   . LV (left ventricular) mural thrombus     2009, on coumadin  . Small bowel obstruction     s/p Small bowel resection 2001  . Jejunal intussusception     2008  .  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 Splenic mass     Noted on Abd [place] 2009 w/ recs for f/u CT - not done   Past Surgical History  Procedure Laterality Date  . Cardiac defibrillator placement      2005, replaced w/ St. Jude's 2010  . Vasectomy    . Bowel resection      small bowell 2/2 obstruction 2001  . Pacemaker insertion     Family History  Problem Relation Age of Onset  . Other      No known family h/o heart disease  . Diabetes Mother    History  Substance Use Topics  . Smoking status: Never Smoker   . Smokeless tobacco: Never Used  . Alcohol Use: No    Review of Systems  Constitutional: Negative for fever, appetite change and fatigue.  HENT: Positive for sore throat (left side). Negative for congestion, sinus pressure and ear discharge.   Eyes: Negative for discharge.  Respiratory: Negative for cough and shortness of breath.   Cardiovascular: Negative for chest pain.  Gastrointestinal: Positive for nausea. Negative for abdominal pain and diarrhea.  Genitourinary: Negative for frequency and hematuria.  Musculoskeletal: Negative for back pain.  Skin: Negative for rash.  Neurological: Negative for seizures and headaches.  Psychiatric/Behavioral: Negative for hallucinations.  All other systems reviewed and are negative.    Allergies  Review of patient's  allergies indicates no known allergies.  Home Medications   Current Outpatient Rx  Name  Route  Sig  Dispense  Refill  . aspirin 81 MG tablet   Oral   Take 81 mg by mouth every morning.          . carvedilol (COREG) 6.25 MG tablet   Oral   Take 1 tablet (6.25 mg total) by mouth 2 (two) times daily with a meal.   60 tablet   3   . digoxin (LANOXIN) 0.125 MG tablet   Oral   Take 125 mcg by mouth every morning.          . docusate sodium (COLACE) 100 MG capsule   Oral   Take 100 mg by mouth every morning.          . furosemide (LASIX) 40 MG tablet   Oral    Take 40 mg by mouth every morning.         . insulin glargine (LANTUS) 100 UNIT/ML injection   Subcutaneous   Inject 30 Units into the skin at bedtime.          Marland Kitchen lisinopril (PRINIVIL,ZESTRIL) 5 MG tablet   Oral   Take 5 mg by mouth 2 (two) times daily.         . magnesium oxide (MAG-OX) 400 MG tablet   Oral   Take 400 mg by mouth every morning.          Marland Kitchen PARoxetine (PAXIL) 40 MG tablet   Oral   Take 20 mg by mouth every morning.          . pravastatin (PRAVACHOL) 40 MG tablet   Oral   Take 40 mg by mouth every evening.          . Rivaroxaban 20 MG TABS   Oral   Take 1 tablet by mouth every evening.          . sertraline (ZOLOFT) 25 MG tablet   Oral   Take 25 mg by mouth every morning.          Marland Kitchen spironolactone (ALDACTONE) 25 MG tablet   Oral   Take 25 mg by mouth every morning.           BP 106/74  Pulse 89  Temp(Src) 98.5 F (36.9 C) (Oral)  Resp 18  SpO2 98% Physical Exam  Nursing note and vitals reviewed. Constitutional: He is oriented to person, place, and time. He appears well-developed and well-nourished. No distress.  HENT:  Head: Normocephalic and atraumatic.  Right Ear: External ear normal.  Left Ear: External ear normal.  Nose: Nose normal.  Mouth/Throat: Uvula is midline. Mucous membranes are dry. Posterior oropharyngeal erythema present.  No trismus or submental edema.  Eyes: Conjunctivae are normal.  Neck: Trachea normal, normal range of motion and phonation normal. No rigidity. No tracheal deviation present.  TTP left aspect of the neck.  Cardiovascular: Normal rate, regular rhythm and normal heart sounds.   Pulmonary/Chest: Effort normal and breath sounds normal. No stridor.  Abdominal: Soft. He exhibits no distension. There is no tenderness.  Musculoskeletal: Normal range of motion.  Lymphadenopathy:    He has no cervical adenopathy.  Neurological: He is alert and oriented to person, place, and time.  Skin: Skin is warm  and dry. He is not diaphoretic.  Psychiatric: He has a normal mood and affect. His behavior is normal.    ED Course  Procedures (including critical care time)  COORDINATION OF CARE:  9:41PM - rapid strep test, oxycodone, and zofran will be ordered for Andre Tran.   11:00PM - rapid strep test results are negative. Culture will be ordered and pt will be called with results in 2-3 days.   Labs Reviewed  RAPID STREP SCREEN  CULTURE, GROUP A STREP   No results found. 1. Pharyngitis     MDM  Pt afebrile without tonsillar exudate, negative strep. Presents without cervical lymphadenopathy. Dysphagia. Diagnosis of viral pharyngitis. No abx indicated. DC w symptomatic tx for pain  Discussed importance of water rehydration. Presentation non concerning for PTA or infxn spread to soft tissue. No trismus or uvula deviation. Specific return precautions discussed. Pt able to drink water in ED without difficulty with intact air way. Recommended PCP follow up. Discussed case with Dr. Rosalia Hammers who agrees with plan.     I personally performed the services described in this documentation, which was scribed in my presence. The recorded information has been reviewed and is accurate.    Andre Bellman, PA-C 01/07/13 [phone]

## 2013-01-06 NOTE — ED Notes (Signed)
Pt began having a sore throat on Saturday. Pt complains of dysphasia. Appetite poor.

## 2013-01-07 NOTE — ED Provider Notes (Signed)
History/physical exam/procedure(s) were performed by non-physician practitioner and as supervising physician I was immediately available for consultation/collaboration. I have reviewed all notes and am in agreement with care and plan.   Hilario Quarry, MD 01/07/13 [phone]

## 2013-01-08 LAB — CULTURE, GROUP A STREP

## 2013-01-10 ENCOUNTER — Other Ambulatory Visit: Payer: Self-pay | Admitting: Oncology

## 2013-01-10 DIAGNOSIS — C8589 Other specified types of non-Hodgkin lymphoma, extranodal and solid organ sites: Secondary | ICD-10-CM

## 2013-01-16 ENCOUNTER — Ambulatory Visit: Payer: TRICARE For Life (TFL)

## 2013-01-16 ENCOUNTER — Ambulatory Visit (HOSPITAL_BASED_OUTPATIENT_CLINIC_OR_DEPARTMENT_OTHER): Payer: Medicare Other | Admitting: Oncology

## 2013-01-16 ENCOUNTER — Telehealth: Payer: Self-pay | Admitting: Oncology

## 2013-01-16 ENCOUNTER — Other Ambulatory Visit (HOSPITAL_BASED_OUTPATIENT_CLINIC_OR_DEPARTMENT_OTHER): Payer: Medicare Other | Admitting: Lab

## 2013-01-16 VITALS — BP 118/79 | HR 82 | Temp 97.1°F | Resp 18 | Ht 67.0 in | Wt 171.5 lb

## 2013-01-16 DIAGNOSIS — C8589 Other specified types of non-Hodgkin lymphoma, extranodal and solid organ sites: Secondary | ICD-10-CM

## 2013-01-16 LAB — CBC WITH DIFFERENTIAL/PLATELET
Basophils Absolute: 0.1 10*3/uL (ref 0.0–0.1)
Eosinophils Absolute: 0.5 10*3/uL (ref 0.0–0.5)
HCT: 38.7 % (ref 38.4–49.9)
HGB: 12.8 g/dL — ABNORMAL LOW (ref 13.0–17.1)
MONO#: 0.4 10*3/uL (ref 0.1–0.9)
NEUT#: 3.3 10*3/uL (ref 1.5–6.5)
NEUT%: 56.5 % (ref 39.0–75.0)
WBC: 5.9 10*3/uL (ref 4.0–10.3)
lymph#: 1.6 10*3/uL (ref 0.9–3.3)

## 2013-01-16 LAB — COMPREHENSIVE METABOLIC PANEL (CC13)
Albumin: 4 g/dL (ref 3.5–5.0)
BUN: 10.4 mg/dL (ref 7.0–26.0)
CO2: 26 mEq/L (ref 22–29)
Calcium: 10 mg/dL (ref 8.4–10.4)
Chloride: 103 mEq/L (ref 98–109)
Creatinine: 1.1 mg/dL (ref 0.7–1.3)
Potassium: 4.7 mEq/L (ref 3.5–5.1)

## 2013-01-16 NOTE — Telephone Encounter (Signed)
, °

## 2013-01-16 NOTE — Progress Notes (Signed)
Reason for Referral: History of lymphoma.   HPI: Mr. Aydelott is a pleasant 52 year old gentleman native of [place] currently of [facility]. He is a pleasant gentleman who served in the [employer]	[place] for years and lived in multiple locations predominantly in [place]. She was diagnosed with Hodgkin's disease in 1993 and was treated on nasal hospitals in [place] predominantly with chemotherapy. He never had any radiation therapy if he can recall. In 2007, developed non-Hodgkin's disease per his report. And he was treated with systemic chemotherapy again and was in remission since that time. He did develop cardiomyopathy that is probably related to chemotherapy and Adriamycin and subsequently developed of the thrombus and also had a CVA in 2008 to the basal ganglia. Patient had been followed regularly by his primary care physician at the [facility] clinic as well as by cardiology by Dr. Gala Romney. Patient referred to me for evaluation given his history of lymphoma. Clinically, he is improving from his stroke in 2008. He is ambulatory but not working out. He does not use any walker or cane. And does not report any symptoms to suggest recurrent lymphoma. He is not reporting any fevers or chills. Report any weight loss or deterioration in performance status. Has not reported any bulky adenopathy in the neck or chest area. Did not report any early satiety or change in his bowel habits.   Past Medical History  Diagnosis Date  . Paroxysmal ventricular tachycardia     s/p AICD '05, replaced w/ St. Jude's in 2010 (PVT/V.Fib arrest requiring ICD implant in 2005)  . HYPERTENSION, UNSPECIFIED   . HYPERLIPIDEMIA-MIXED   . 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 Nonischemic cardiomyopathy     2/2 Adriamycin administration for lymphoma  . CHF (congestive heart failure)     EF 15% by echo 2009; s/p St.  Jude ICD  . Diabetes mellitus     Type 2  . Cancer      non hodkins lymphoma 1992, Hodgkins 2007  . Depression   . LV (left ventricular) mural thrombus     2009, on coumadin  . Small bowel obstruction     s/p Small bowel resection 2001  . Jejunal intussusception     2008  . 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 Splenic mass     Noted on Abd [place] 2009 w/ recs for f/u CT - not done  :  Past Surgical History  Procedure Laterality Date  . Cardiac defibrillator placement      2005, replaced w/ St. Jude's 2010  . Vasectomy    . Bowel resection      small bowell 2/2 obstruction 2001  . Pacemaker insertion    :  Current outpatient prescriptions:aspirin 81 MG tablet, Take 81 mg by mouth every morning. , Disp: , Rfl: ;  carvedilol (COREG) 6.25 MG tablet, Take 1 tablet (6.25 mg total) by mouth 2 (two) times daily with a meal., Disp: 60 tablet, Rfl: 3;  digoxin (LANOXIN) 0.125 MG tablet, Take 125 mcg by mouth every morning. , Disp: , Rfl: ;  docusate sodium (COLACE) 100 MG capsule, Take 100 mg by mouth every morning. , Disp: , Rfl:  furosemide (LASIX) 40 MG tablet, Take 40 mg by mouth every morning., Disp: , Rfl: ;  insulin glargine (LANTUS) 100 UNIT/ML injection, Inject 30 Units into the  skin at bedtime. , Disp: , Rfl: ;  lisinopril (PRINIVIL,ZESTRIL) 5 MG tablet, Take 5 mg by mouth 2 (two) times daily., Disp: , Rfl: ;  magnesium oxide (MAG-OX) 400 MG tablet, Take 400 mg by mouth every morning. , Disp: , Rfl:  oxyCODONE-acetaminophen (ROXICET) 5-325 MG/5ML solution, Take 5 mLs by mouth every 4 (four) hours as needed for pain., Disp: 500 mL, Rfl: 0;  PARoxetine (PAXIL) 40 MG tablet, Take 20 mg by mouth every morning. , Disp: , Rfl: ;  pravastatin (PRAVACHOL) 40 MG tablet, Take 40 mg by mouth every evening. , Disp: , Rfl:  promethazine (PHENERGAN) 25 MG tablet, Take 1 tablet (25 mg total) by mouth every 6 (six) hours as needed for nausea., Disp: 12 tablet, Rfl: 0;   Rivaroxaban 20 MG TABS, Take 1 tablet by mouth every evening. , Disp: , Rfl: ;  sertraline (ZOLOFT) 25 MG tablet, Take 25 mg by mouth every morning. , Disp: , Rfl: ;  spironolactone (ALDACTONE) 25 MG tablet, Take 25 mg by mouth every morning. , Disp: , Rfl: :  No Known Allergies:  Family History  Problem Relation Age of Onset  . Other      No known family h/o heart disease  . Diabetes Mother   :  History   Social History  . Marital Status: Married    Spouse Name: N/A    Number of Children: N/A  . Years of Education: N/A   Occupational History  . Not on file.   Social History Main Topics  . Smoking status: Never Smoker   . Smokeless tobacco: Never Used  . Alcohol Use: No  . Drug Use: No  . Sexually Active: Not on file   Other Topics Concern  . Not on file   Social History Narrative  . No narrative on file  :  A comprehensive review of systems was negative.  Exam: Blood pressure 118/79, pulse 82, temperature 97.1 F (36.2 C), temperature source Oral, resp. rate 18, height 5\' 7"  (1.702 m), weight 171 lb 8 oz (77.792 kg). General appearance: alert and cooperative Head: Normocephalic, without obvious abnormality, atraumatic Nose: Nares normal. Septum midline. Mucosa normal. No drainage or sinus tenderness. Throat: lips, mucosa, and tongue normal; teeth and gums normal Neck: no adenopathy, no carotid bruit, no JVD, supple, symmetrical, trachea midline and thyroid not enlarged, symmetric, no tenderness/mass/nodules Back: negative, symmetric, no curvature. ROM normal. No CVA tenderness. Resp: clear to auscultation bilaterally Chest wall: no tenderness Cardio: regular rate and rhythm, S1, S2 normal, no murmur, click, rub or gallop GI: soft, non-tender; bowel sounds normal; no masses,  no organomegaly Extremities: extremities normal, atraumatic, no cyanosis or edema Pulses: 2+ and symmetric Lymph nodes: Cervical, supraclavicular, and axillary nodes normal.   Recent  Labs  01/16/13 1041  WBC 5.9  HGB 12.8*  HCT 38.7  PLT 224    Recent Labs  01/16/13 1041  NA 137  K 4.7  CO2 26  GLUCOSE 296*  BUN 10.4  CREATININE 1.1  CALCIUM 10.0     Assessment and Plan:   52 year old gentleman with the following issues:  1. History of Hodgkin's disease that dates back to 1993 treated with systemic chemotherapy the details of which are not available to me at this time.  2. Non-Hodgkin's lymphoma diagnosed in 2007 again treated with systemic chemotherapy. The details of this specific disease in treatment is not available to me. He did not have any [facility] and [facility]. All he can recall, that he was told that this is non-Hodgkin's disease and appears to have been treated with a Adriamycin-based regimen that might have caused cardiomyopathy. Clinically, there is no evidence to suggest recurrent disease at this time by symptomatology, physical exam or laboratory data. From a management standpoint, first up is to obtain his medical records to determine the exact subtype of his lymphoma which will give me a better idea of his relapse risk which I think it would be low yield any given the fact that have been 7 years since he was last treated. Also, I think would be reasonable to stage him with a CT scan neck, chest, abdomen and pelvis. If that's clear annual surveillance with a physical examination would be sufficient at this time. He will followup with me after he completes the imaging studies as well as we obtain his records from [place].  2. Cardiomyopathy with an EF of 50% with an ICD in place. Followed with Dr. Ladona Ridgel and Dr. Gala Romney form cardiology.

## 2013-01-22 ENCOUNTER — Ambulatory Visit (HOSPITAL_COMMUNITY)
Admission: RE | Admit: 2013-01-22 | Discharge: 2013-01-22 | Disposition: A | Discharge status: Home / Self Care | Payer: Medicare Other | Source: Ambulatory Visit | Attending: Oncology | Admitting: Oncology

## 2013-01-22 ENCOUNTER — Encounter (HOSPITAL_COMMUNITY): Payer: Self-pay

## 2013-01-22 DIAGNOSIS — I824Y9 Acute embolism and thrombosis of unspecified deep veins of unspecified proximal lower extremity: Secondary | ICD-10-CM | POA: Insufficient documentation

## 2013-01-22 DIAGNOSIS — K8689 Other specified diseases of pancreas: Secondary | ICD-10-CM | POA: Insufficient documentation

## 2013-01-22 DIAGNOSIS — Z950 Presence of cardiac pacemaker: Secondary | ICD-10-CM | POA: Insufficient documentation

## 2013-01-22 DIAGNOSIS — K7689 Other specified diseases of liver: Secondary | ICD-10-CM | POA: Insufficient documentation

## 2013-01-22 DIAGNOSIS — I82220 Acute embolism and thrombosis of inferior vena cava: Secondary | ICD-10-CM | POA: Insufficient documentation

## 2013-01-22 DIAGNOSIS — I700 Atherosclerosis of aorta: Secondary | ICD-10-CM | POA: Insufficient documentation

## 2013-01-22 DIAGNOSIS — M47812 Spondylosis without myelopathy or radiculopathy, cervical region: Secondary | ICD-10-CM | POA: Insufficient documentation

## 2013-01-22 DIAGNOSIS — C8589 Other specified types of non-Hodgkin lymphoma, extranodal and solid organ sites: Secondary | ICD-10-CM | POA: Insufficient documentation

## 2013-01-22 MED ORDER — IOHEXOL 300 MG/ML  SOLN
100.0000 mL | Freq: Once | INTRAMUSCULAR | Status: AC | PRN
  Administered 2013-01-22: 100 mL via INTRAVENOUS

## 2013-02-05 ENCOUNTER — Encounter (HOSPITAL_COMMUNITY): Payer: Self-pay

## 2013-02-05 ENCOUNTER — Ambulatory Visit (HOSPITAL_COMMUNITY)
Admission: RE | Admit: 2013-02-05 | Discharge: 2013-02-05 | Disposition: A | Discharge status: Home / Self Care | Payer: Medicare Other | Source: Ambulatory Visit | Attending: Internal Medicine | Admitting: Internal Medicine

## 2013-02-05 VITALS — BP 112/64 | HR 89 | Wt 166.0 lb

## 2013-02-05 DIAGNOSIS — F329 Major depressive disorder, single episode, unspecified: Secondary | ICD-10-CM | POA: Insufficient documentation

## 2013-02-05 DIAGNOSIS — Z794 Long term (current) use of insulin: Secondary | ICD-10-CM | POA: Insufficient documentation

## 2013-02-05 DIAGNOSIS — I69998 Other sequelae following unspecified cerebrovascular disease: Secondary | ICD-10-CM | POA: Insufficient documentation

## 2013-02-05 DIAGNOSIS — F3289 Other specified depressive episodes: Secondary | ICD-10-CM | POA: Insufficient documentation

## 2013-02-05 DIAGNOSIS — Z9221 Personal history of antineoplastic chemotherapy: Secondary | ICD-10-CM | POA: Insufficient documentation

## 2013-02-05 DIAGNOSIS — E119 Type 2 diabetes mellitus without complications: Secondary | ICD-10-CM | POA: Insufficient documentation

## 2013-02-05 DIAGNOSIS — I5022 Chronic systolic (congestive) heart failure: Secondary | ICD-10-CM

## 2013-02-05 DIAGNOSIS — I509 Heart failure, unspecified: Secondary | ICD-10-CM | POA: Insufficient documentation

## 2013-02-05 DIAGNOSIS — I10 Essential (primary) hypertension: Secondary | ICD-10-CM | POA: Insufficient documentation

## 2013-02-05 DIAGNOSIS — Z7982 Long term (current) use of aspirin: Secondary | ICD-10-CM | POA: Insufficient documentation

## 2013-02-05 DIAGNOSIS — Z95810 Presence of automatic (implantable) cardiac defibrillator: Secondary | ICD-10-CM | POA: Insufficient documentation

## 2013-02-05 DIAGNOSIS — I219 Acute myocardial infarction, unspecified: Secondary | ICD-10-CM

## 2013-02-05 DIAGNOSIS — I5189 Other ill-defined heart diseases: Secondary | ICD-10-CM | POA: Insufficient documentation

## 2013-02-05 DIAGNOSIS — I513 Intracardiac thrombosis, not elsewhere classified: Secondary | ICD-10-CM

## 2013-02-05 DIAGNOSIS — Z79899 Other long term (current) drug therapy: Secondary | ICD-10-CM | POA: Insufficient documentation

## 2013-02-05 DIAGNOSIS — E782 Mixed hyperlipidemia: Secondary | ICD-10-CM | POA: Insufficient documentation

## 2013-02-05 DIAGNOSIS — R5383 Other fatigue: Secondary | ICD-10-CM | POA: Insufficient documentation

## 2013-02-05 DIAGNOSIS — R5381 Other malaise: Secondary | ICD-10-CM | POA: Insufficient documentation

## 2013-02-05 DIAGNOSIS — I428 Other cardiomyopathies: Secondary | ICD-10-CM | POA: Insufficient documentation

## 2013-02-05 DIAGNOSIS — Z87898 Personal history of other specified conditions: Secondary | ICD-10-CM | POA: Insufficient documentation

## 2013-02-05 MED ORDER — CARVEDILOL 6.25 MG PO TABS: 9.3750 mg | ORAL_TABLET | Freq: Two times a day (BID) | ORAL | Status: DC

## 2013-02-05 NOTE — Patient Instructions (Addendum)
Take carvedilol 9.375 mg twice a day  Do the following things EVERYDAY: 1) Weigh yourself in the morning before breakfast. Write it down and keep it in a log. 2) Take your medicines as prescribed 3) Eat low salt foods-Limit salt (sodium) to 2000 mg per day.  4) Stay as active as you can everyday 5) Limit all fluids for the day to less than 2 liters  Follow up in 6 weeks

## 2013-02-08 ENCOUNTER — Telehealth: Payer: Self-pay | Admitting: Oncology

## 2013-02-08 ENCOUNTER — Ambulatory Visit (HOSPITAL_BASED_OUTPATIENT_CLINIC_OR_DEPARTMENT_OTHER): Payer: Medicare Other | Admitting: Oncology

## 2013-02-08 VITALS — BP 121/76 | HR 87 | Temp 97.8°F | Resp 18 | Ht 67.0 in | Wt 170.8 lb

## 2013-02-08 DIAGNOSIS — C8589 Other specified types of non-Hodgkin lymphoma, extranodal and solid organ sites: Secondary | ICD-10-CM

## 2013-02-08 DIAGNOSIS — Z87898 Personal history of other specified conditions: Secondary | ICD-10-CM

## 2013-02-08 DIAGNOSIS — I824Y9 Acute embolism and thrombosis of unspecified deep veins of unspecified proximal lower extremity: Secondary | ICD-10-CM

## 2013-02-08 NOTE — Progress Notes (Signed)
Hematology and Oncology Follow Up Visit  Andre Tran [id number] Jul 30, 1960 52 y.o. 02/08/2013 3:49 PM Default, Provider, MDNo ref. provider found   Principle Diagnosis: 52 year old gentleman with history of Hodgkin's disease diagnosed in 1993 and subsequently developed non-Hodgkin's disease in 2007.   Prior Therapy: He is status post therapy for both lymphomas that included radiation and chemotherapy likely Adriamycin based although the details of this therapy is not available to me are still requesting medical records.  Current therapy: Observation and surveillance.  Interim History:  Andre Tran presents today for a followup visit. He is a very nice man I saw on 01/16/2013 for consultation regarding history of lymphoma. Since his last evaluation, he has been completely asymptomatic. He had not reported any fevers or chills. Had not reported any lymphadenopathy or difficulty swallowing. His performance status and activity level remains unchanged.  Medications: I have reviewed the patient's current medications.  Current Outpatient Prescriptions  Medication Sig Dispense Refill  . aspirin 81 MG tablet Take 81 mg by mouth every morning.       . carvedilol (COREG) 6.25 MG tablet Take 1.5 tablets (9.375 mg total) by mouth 2 (two) times daily with a meal.  90 tablet  3  . digoxin (LANOXIN) 0.125 MG tablet Take 125 mcg by mouth every morning.       . docusate sodium (COLACE) 100 MG capsule Take 100 mg by mouth every morning.       . furosemide (LASIX) 40 MG tablet Take 40 mg by mouth every morning.      . insulin glargine (LANTUS) 100 UNIT/ML injection Inject 30 Units into the skin at bedtime.       Marland Kitchen lisinopril (PRINIVIL,ZESTRIL) 5 MG tablet Take 5 mg by mouth 2 (two) times daily.      . magnesium oxide (MAG-OX) 400 MG tablet Take 400 mg by mouth every morning.       Marland Kitchen oxyCODONE-acetaminophen (ROXICET) 5-325 MG/5ML solution Take 5 mLs by mouth every 4 (four) hours as needed for pain.  500 mL  0  .  PARoxetine (PAXIL) 40 MG tablet Take 20 mg by mouth every morning.       . pravastatin (PRAVACHOL) 40 MG tablet Take 40 mg by mouth every evening.       . promethazine (PHENERGAN) 25 MG tablet Take 1 tablet (25 mg total) by mouth every 6 (six) hours as needed for nausea.  12 tablet  0  . Rivaroxaban 20 MG TABS Take 1 tablet by mouth every evening.       . sertraline (ZOLOFT) 25 MG tablet Take 25 mg by mouth every morning.       Marland Kitchen spironolactone (ALDACTONE) 25 MG tablet Take 25 mg by mouth every morning.        No current facility-administered medications for this visit.     Allergies: No Known Allergies  Past Medical History, Surgical history, Social history, and Family History were reviewed and updated.  Review of Systems: Remaining ROS negative. Physical Exam: Blood pressure 121/76, pulse 87, temperature 97.8 F (36.6 C), temperature source Oral, resp. rate 18, height 5\' 7"  (1.702 m), weight 170 lb 12.8 oz (77.474 kg). ECOG: 1 General appearance: alert Head: Normocephalic, without obvious abnormality, atraumatic Neck: no adenopathy, no carotid bruit, no JVD, supple, symmetrical, trachea midline and thyroid not enlarged, symmetric, no tenderness/mass/nodules Lymph nodes: Cervical, supraclavicular, and axillary nodes normal. Heart:regular rate and rhythm, S1, S2 normal, no murmur, click, rub or gallop Lung:chest clear, no wheezing,  rales, normal symmetric air entry Abdomin: soft, non-tender, without masses or organomegaly EXT:no erythema, induration, or nodules   Lab Results: Lab Results  Component Value Date   WBC 5.9 01/16/2013   HGB 12.8* 01/16/2013   HCT 38.7 01/16/2013   MCV 91.5 01/16/2013   PLT 224 01/16/2013     Chemistry      Component Value Date/Time   NA 137 01/16/2013 1041   NA 136 11/07/2012 1624   K 4.7 01/16/2013 1041   K 4.0 11/07/2012 1624   CL 100 11/07/2012 1624   CO2 26 01/16/2013 1041   CO2 29 11/07/2012 1624   BUN 10.4 01/16/2013 1041   BUN 21 11/07/2012 1624    CREATININE 1.1 01/16/2013 1041   CREATININE 0.88 11/07/2012 1624      Component Value Date/Time   CALCIUM 10.0 01/16/2013 1041   CALCIUM 9.7 11/07/2012 1624   ALKPHOS 116 01/16/2013 1041   ALKPHOS 81 04/25/2007 0550   AST 13 01/16/2013 1041   AST 18 04/25/2007 0550   ALT 13 01/16/2013 1041   ALT 18 04/25/2007 0550   BILITOT 0.82 01/16/2013 1041   BILITOT 1.1 04/25/2007 0550       Radiological Studies: CT CHEST, ABDOMEN AND PELVIS WITH CONTRAST  Technique: Multidetector CT imaging of the chest, abdomen and  pelvis was performed following the standard protocol during bolus  administration of intravenous contrast.  Contrast: OMNIPAQUE IOHEXOL 300 MG/ML SOLN  Comparison: 04/13/2007.  CT CHEST  Findings: The chest wall is unremarkable. No breast masses,  supraclavicular or axillary lymphadenopathy. Small scattered lymph  nodes are noted. A permanent left-sided pacemaker is noted. The  thyroid gland is normal. The bony thorax is intact. Mixed lytic  and sclerotic process noted in the T12, L1 and L2 vertebral bodies  and also in the sacrum. Unchanged appearance since 2008. This may  represent treated osseous lymphoma. No new destructive bone  lesions.  The heart is normal in size. No pericardial effusion. No  mediastinal or hilar lymphadenopathy. Small scattered lymph nodes  are noted. The aorta is normal in caliber. No dissection. The  esophagus is grossly normal.  Examination of the lung parenchyma demonstrates no acute pulmonary  findings. No worrisome pulmonary nodules. There are small  intrapulmonary lymph nodes noted along the fissures. No pleural  effusion.  IMPRESSION:  1. No CT findings for lymphoma involving the chest.  2. Scattered small subpleural pulmonary lymph nodes.  3. Stable osseous changes in the spine and sacrum.  CT ABDOMEN AND PELVIS  Findings: The liver demonstrates diffuse fatty infiltration. No  focal hepatic lesions. The gallbladder is normal. No common bile   duct dilatation. Moderate pancreatic atrophy but no mass or  inflammation. The spleen is unremarkable. The adrenal glands and  kidneys are normal.  The stomach, duodenum, small bowel and colon are unremarkable. No  inflammatory changes or mass lesions. No mesenteric or  retroperitoneal mass or adenopathy. Moderate atherosclerotic  calcifications involving the aorta. Extensive thrombosis of the  left common iliac vein, the internal and external iliac veins and  the left common femoral vein with some clot extending up into the  IVC.  The bladder, prostate gland and seminal vesicles are unremarkable.  No pelvic mass, adenopathy or free pelvic fluid collections. No  inguinal mass or adenopathy.  Again noted are remote osseous changes of probable treated  lymphoma.  IMPRESSION:  1. 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  2. No adenopathy in  the abdomen or pelvis.  3. Diffuse fatty infiltration of the liver.  4. Advanced atherosclerotic calcifications involving the aorta.  5. Extensive thrombus in the left common femoral vein, left iliac  veins and IVC.    Impression and Plan:  52 year old gentleman with the following issues:  1. History of Hodgkin's disease that dates back to 1993 treated with systemic chemotherapy the details of which are not available to me at this time  2. Non-Hodgkin's lymphoma diagnosed in 2007 again treated with systemic chemotherapy. The details of this specific disease in treatment is not available to me. CT scan from 01/22/2013 did not show any evidence of recurrent disease at this time. He is reporting that he has some of his medical records and he will drop them off in the office next week. At this time, I see no evidence of any relapse of recurrence I will continue active surveillance with followup in 6 months and possibly repeat imaging studies in 12 months or as needed.  3. Extensive thrombosis of the left common femoral vein  left iliac vein and IVC. Unclear of these are old or new findings. He is completely asymptomatic at this point and he is already on Xarelto. This is most likely represents chronic thrombus and I don't think any intervention is needed at this time.  [facility], MD 8/1/20143:49 PM

## 2013-02-08 NOTE — Telephone Encounter (Signed)
gv and printed appt sched and avs for pt  °

## 2013-02-10 DIAGNOSIS — I513 Intracardiac thrombosis, not elsewhere classified: Secondary | ICD-10-CM | POA: Insufficient documentation

## 2013-02-10 NOTE — Progress Notes (Signed)
Patient ID: Andre Tran, male   DOB: 1961/06/05, 51 y.o.   MRN: [id number] Physicians EP : Dr Ladona Ridgel [name] Oncologist: Dr Clelia Croft   HPI: 52 y.o. 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  He has h/o recurrent lymphoma (1993, 2007) treated with chemo (including adriamycin) - details unclear.  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   12/16/11 ECHO EF 15% global HK. No apical thrombus.  12/04/12 ECHO EF 15% RV mildly hypokinetic (reviewed personally)  Evaluated in [facility] ED 11/02/12 due to dyspnea and later discharged. He had been out of lasix for 1 month. He was restarted on lasix 40 mg daily. CEs negative. Pro BNP 1633.   12/12/12 CPX Peak VO2: 16.9 ml/kg/min % predicted peak VO2: 46.9% VE/VCO2 slope: 33.3 OUES: 1.36 Peak RER: 1.19 Moderate to severe functional limitation due to heart failure.  Evaluated by Dr Clelia Croft 01/16/13 with plans to restage lymphoma.   He returns for follow up. Last visit day time carvedilol was increased to 6.25 mg and he continued on carvedilol 6.25 mg at night.  Denies SOB/PND/Orthopnea/dizziness. Able to walk up steps. Compliant with medications. Weight at home trending down to 162  pounds.  Says he is trying to lose weight.      ROS: All systems negative except as listed in HPI, PMH and Problem List.  Past Medical History  Diagnosis Date  . Paroxysmal ventricular tachycardia     s/p AICD '05, replaced w/ St. Jude's in 2010 (PVT/V.Fib arrest requiring ICD implant in 2005)  . HYPERTENSION, UNSPECIFIED   . HYPERLIPIDEMIA-MIXED   . 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 Nonischemic cardiomyopathy     2/2 Adriamycin administration for lymphoma  . CHF (congestive heart failure)     EF 15%  by echo 2009; s/p St. Jude ICD  . Diabetes mellitus     Type 2  . Cancer      non hodkins lymphoma 1992, Hodgkins 2007  . Depression   . LV (left ventricular) mural thrombus     2009, on coumadin  . Small bowel obstruction     s/p Small bowel resection 2001  . Jejunal intussusception     2008  . 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 Splenic mass     Noted on Abd [place] 2009 w/ recs for f/u CT - not done    Current Outpatient Prescriptions  Medication Sig Dispense Refill  . aspirin 81 MG tablet Take 81 mg by mouth every morning.       . carvedilol (COREG) 6.25 MG tablet Take 1.5 tablets (9.375 mg total) by mouth 2 (two) times daily with a meal.  90 tablet  3  . digoxin (LANOXIN) 0.125 MG tablet Take 125 mcg by mouth every morning.       . docusate sodium (COLACE) 100 MG capsule Take 100 mg by mouth every morning.       . furosemide (LASIX) 40 MG tablet Take 40 mg by mouth every morning.      . insulin glargine (LANTUS) 100 UNIT/ML injection Inject 30 Units into the skin at bedtime.       Marland Kitchen lisinopril (PRINIVIL,ZESTRIL) 5 MG tablet  Take 5 mg by mouth 2 (two) times daily.      . magnesium oxide (MAG-OX) 400 MG tablet Take 400 mg by mouth every morning.       Marland Kitchen oxyCODONE-acetaminophen (ROXICET) 5-325 MG/5ML solution Take 5 mLs by mouth every 4 (four) hours as needed for pain.  500 mL  0  . PARoxetine (PAXIL) 40 MG tablet Take 20 mg by mouth every morning.       . pravastatin (PRAVACHOL) 40 MG tablet Take 40 mg by mouth every evening.       . promethazine (PHENERGAN) 25 MG tablet Take 1 tablet (25 mg total) by mouth every 6 (six) hours as needed for nausea.  12 tablet  0  . Rivaroxaban 20 MG TABS Take 1 tablet by mouth every evening.       . sertraline (ZOLOFT) 25 MG tablet Take 25 mg by mouth every morning.       Marland Kitchen spironolactone (ALDACTONE) 25 MG tablet Take 25 mg by mouth every morning.        No current facility-administered medications for this  encounter.     PHYSICAL EXAM: Filed Vitals:   02/05/13 1003  BP: 112/64  Pulse: 89  Weight: 166 lb (75.297 kg)  SpO2: 100%    General:  Well appearing. No resp difficulty HEENT: normal Neck: supple. JVP 5-6 Carotids 2+ bilaterally; no bruits. No lymphadenopathy or thryomegaly appreciated. Cor: PMI normal. Regular rate & rhythm. No rubs, gallops or murmurs. Lungs: clear Abdomen: soft, nontender, nondistended. No hepatosplenomegaly. No bruits or masses. Good bowel sounds. Extremities: no cyanosis, clubbing, rash, edema Neuro: alert & orientedx3, cranial nerves grossly intact. Moves all 4 extremities w/o difficulty. Affect pleasant.     ASSESSMENT: 1. Chronic systolic HF      -EF 15% due to NICM (Likely adriamycin related) 2. H/o VT/VF s/p STJ ICD 3. H/o LV thrombus on chronic Xarelto 4. Recurrent lymphoma (1993, 2007) undergoing restaging with Dr. Clelia Croft felt to be inactiv 5. CVA in 2008 with residual right-sided weakness.   PLAN:  He is improving slowly despite severe LV dysfunction and marginal CPX test. Given that LV dysfunction is likely due to adriamycin, I am not optimistic he will recover significantly. Long talk for need for close surveillance of symptoms and volume status. We also discussed various options for advanced therapies. Currently NYHA II-III. Will increase carvedilol to 9.375 bid. F/u in 1 month.  Continue Xarelto.   Reuel Boom Trixie Maclaren,MD 4:48 PM

## 2013-03-21 ENCOUNTER — Ambulatory Visit (HOSPITAL_COMMUNITY)
Admission: RE | Admit: 2013-03-21 | Discharge: 2013-03-21 | Disposition: A | Discharge status: Home / Self Care | Payer: Medicare Other | Source: Ambulatory Visit | Attending: Cardiology | Admitting: Cardiology

## 2013-03-21 VITALS — BP 98/66 | HR 80 | Wt 168.8 lb

## 2013-03-21 DIAGNOSIS — I5189 Other ill-defined heart diseases: Secondary | ICD-10-CM | POA: Insufficient documentation

## 2013-03-21 DIAGNOSIS — I219 Acute myocardial infarction, unspecified: Secondary | ICD-10-CM

## 2013-03-21 DIAGNOSIS — E782 Mixed hyperlipidemia: Secondary | ICD-10-CM | POA: Insufficient documentation

## 2013-03-21 DIAGNOSIS — I513 Intracardiac thrombosis, not elsewhere classified: Secondary | ICD-10-CM

## 2013-03-21 DIAGNOSIS — I5022 Chronic systolic (congestive) heart failure: Secondary | ICD-10-CM

## 2013-03-21 DIAGNOSIS — E785 Hyperlipidemia, unspecified: Secondary | ICD-10-CM

## 2013-03-21 DIAGNOSIS — I429 Cardiomyopathy, unspecified: Secondary | ICD-10-CM | POA: Insufficient documentation

## 2013-03-21 LAB — DIGOXIN LEVEL: Digoxin Level: 0.3 ng/mL — ABNORMAL LOW (ref 0.8–2.0)

## 2013-03-21 NOTE — Progress Notes (Addendum)
Patient ID: Andre Tran, male   DOB: 06/16/1961, 52 y.o.   MRN: [id number]  Physicians EP : Dr Ladona Ridgel [name] Oncologist: Dr Clelia Croft   HPI: 52 y.o. 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  He has h/o recurrent lymphoma (1993, 2007) treated with chemo (including adriamycin) - details unclear.  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   12/16/11 ECHO EF 15% global HK. No apical thrombus.  12/04/12 ECHO EF 15% RV mildly hypokinetic (reviewed personally)  Evaluated in [facility] ED 11/02/12 due to dyspnea and later discharged. He had been out of lasix for 1 month. He was restarted on lasix 40 mg daily. CEs negative. Pro BNP 1633.   12/12/12 CPX Peak VO2: 16.9 ml/kg/min % predicted peak VO2: 46.9% VE/VCO2 slope: 33.3 OUES: 1.36 Peak RER: 1.19 Moderate to severe functional limitation due to heart failure.  Last visit: Increased coreg to 9.375 mg BID. Feeling fine. Denies SOB/PND/Orthopnea/dizziness or CP. Compliant with medications. Weight at home 160 lbs. Following a low salt diet and drinking less than 2 liters a day. Able to go to grocery store and walk all the way around with no SOB. Last seen by Dr. Clelia Croft 02/2013 and CT showed no findings for lymphoma involving the chest.  SH: Lives with wife in [place]  ROS: All systems negative except as listed in HPI, PMH and Problem List.  Past Medical History  Diagnosis Date  . Paroxysmal ventricular tachycardia     s/p AICD '05, replaced w/ St. Jude's in 2010 (PVT/V.Fib arrest requiring ICD implant in 2005)  . HYPERTENSION, UNSPECIFIED   . HYPERLIPIDEMIA-MIXED   . 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 Nonischemic cardiomyopathy     2/2 Adriamycin administration for lymphoma   . CHF (congestive heart failure)     EF 15% by echo 2009; s/p St. Jude ICD  . Diabetes mellitus     Type 2  . Cancer      non hodkins lymphoma 1992, Hodgkins 2007  . Depression   . LV (left ventricular) mural thrombus     2009, on coumadin  . Small bowel obstruction     s/p Small bowel resection 2001  . Jejunal intussusception     2008  . 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 Splenic mass     Noted on Abd [place] 2009 w/ recs for f/u CT - not done    Current Outpatient Prescriptions  Medication Sig Dispense Refill  . aspirin 81 MG tablet Take 81 mg by mouth every morning.       . carvedilol (COREG) 6.25 MG tablet Take 1.5 tablets (9.375 mg total) by mouth 2 (two) times daily with a meal.  90 tablet  3  . digoxin (LANOXIN) 0.125 MG tablet Take 125 mcg by mouth every morning.       . docusate sodium (COLACE) 100 MG capsule Take 100 mg by mouth every morning.       . furosemide (LASIX) 40 MG tablet Take 40 mg by mouth every morning.      . insulin glargine (LANTUS) 100 UNIT/ML injection Inject 30 Units into the skin at bedtime.       Marland Kitchen  lisinopril (PRINIVIL,ZESTRIL) 5 MG tablet Take 5 mg by mouth 2 (two) times daily.      . magnesium oxide (MAG-OX) 400 MG tablet Take 400 mg by mouth every morning.       Marland Kitchen oxyCODONE-acetaminophen (ROXICET) 5-325 MG/5ML solution Take 5 mLs by mouth every 4 (four) hours as needed for pain.  500 mL  0  . PARoxetine (PAXIL) 40 MG tablet Take 20 mg by mouth every morning.       . pravastatin (PRAVACHOL) 40 MG tablet Take 40 mg by mouth every evening.       . promethazine (PHENERGAN) 25 MG tablet Take 1 tablet (25 mg total) by mouth every 6 (six) hours as needed for nausea.  12 tablet  0  . Rivaroxaban 20 MG TABS Take 1 tablet by mouth every evening.       . sertraline (ZOLOFT) 25 MG tablet Take 25 mg by mouth every morning.       Marland Kitchen spironolactone (ALDACTONE) 25 MG tablet Take 25 mg by mouth every morning.        No current  facility-administered medications for this encounter.    PHYSICAL EXAM: Filed Vitals:   03/21/13 0932  BP: 98/66  Pulse: 80  Weight: 168 lb 12.8 oz (76.567 kg)  SpO2: 99%   General:  Well appearing. No resp difficulty HEENT: normal Neck: supple. JVP 5-6 Carotids 2+ bilaterally; no bruits. No lymphadenopathy or thryomegaly appreciated. Cor: PMI normal. Regular rate & rhythm. No rubs, gallops or murmurs. Lungs: clear Abdomen: soft, nontender, nondistended. No hepatosplenomegaly. No bruits or masses. Good bowel sounds. Extremities: no cyanosis, clubbing, rash, edema Neuro: alert & orientedx3, cranial nerves grossly intact. Moves all 4 extremities w/o difficulty. Affect pleasant.   ASSESSMENT/ PLAN: 1. Chronic systolic HF, NICM likely r/t adriamycin; EF 15% (11/2012) - NYHA II symptoms. Volume status good. - Will not titrate any medications d/t soft BP. Continue coreg 9.375 mg BID, spiro 25 daily, lisinopril 5 mg BID, and digoxin 0.125 - Will get digoxin level today - Reinforced the need and importance of daily weights, a low sodium diet, and fluid restriction (less than 2 L a day). Instructed to call the HF clinic if weight increases more than 3 lbs overnight or 5 lbs in a week.   2. H/o LV thrombus - chronic Xarelto  3. Recurrent lymphoma (1993, 2007) u - seen recently by Dr. Clelia Croft and CT showed no findings for lymphoma involving the chest.  4. Hyperlipidemia:  - [facility]  F/U 2 months  Ulla Potash B NP-C 1:06 PM

## 2013-03-21 NOTE — Patient Instructions (Addendum)
Will not change any medications at this time.  Call if any issues.   Will F/U in 2 months.  Do the following things EVERYDAY: 1) Weigh yourself in the morning before breakfast. Write it down and keep it in a log. 2) Take your medicines as prescribed 3) Eat low salt foods-Limit salt (sodium) to 2000 mg per day.  4) Stay as active as you can everyday 5) Limit all fluids for the day to less than 2 liters 6)

## 2013-03-27 ENCOUNTER — Encounter: Payer: Self-pay | Admitting: *Deleted

## 2013-05-06 ENCOUNTER — Ambulatory Visit (HOSPITAL_COMMUNITY)
Admission: RE | Admit: 2013-05-06 | Discharge: 2013-05-06 | Disposition: A | Discharge status: Home / Self Care | Payer: Medicare Other | Source: Ambulatory Visit | Attending: Adult Health | Admitting: Adult Health

## 2013-05-06 ENCOUNTER — Ambulatory Visit (HOSPITAL_COMMUNITY)
Admission: RE | Admit: 2013-05-06 | Discharge: 2013-05-06 | Disposition: A | Discharge status: Home / Self Care | Payer: Medicare Other | Source: Ambulatory Visit | Attending: Internal Medicine | Admitting: Internal Medicine

## 2013-05-06 VITALS — BP 88/54 | HR 80 | Wt 163.0 lb

## 2013-05-06 DIAGNOSIS — Z7901 Long term (current) use of anticoagulants: Secondary | ICD-10-CM | POA: Insufficient documentation

## 2013-05-06 DIAGNOSIS — I428 Other cardiomyopathies: Secondary | ICD-10-CM | POA: Insufficient documentation

## 2013-05-06 DIAGNOSIS — J069 Acute upper respiratory infection, unspecified: Secondary | ICD-10-CM | POA: Insufficient documentation

## 2013-05-06 DIAGNOSIS — Z8571 Personal history of Hodgkin lymphoma: Secondary | ICD-10-CM | POA: Insufficient documentation

## 2013-05-06 DIAGNOSIS — E119 Type 2 diabetes mellitus without complications: Secondary | ICD-10-CM | POA: Insufficient documentation

## 2013-05-06 DIAGNOSIS — Z87898 Personal history of other specified conditions: Secondary | ICD-10-CM | POA: Insufficient documentation

## 2013-05-06 DIAGNOSIS — I472 Ventricular tachycardia, unspecified: Secondary | ICD-10-CM | POA: Insufficient documentation

## 2013-05-06 DIAGNOSIS — I5022 Chronic systolic (congestive) heart failure: Secondary | ICD-10-CM

## 2013-05-06 DIAGNOSIS — Z7982 Long term (current) use of aspirin: Secondary | ICD-10-CM | POA: Insufficient documentation

## 2013-05-06 DIAGNOSIS — I509 Heart failure, unspecified: Secondary | ICD-10-CM | POA: Insufficient documentation

## 2013-05-06 DIAGNOSIS — E782 Mixed hyperlipidemia: Secondary | ICD-10-CM | POA: Insufficient documentation

## 2013-05-06 DIAGNOSIS — I69998 Other sequelae following unspecified cerebrovascular disease: Secondary | ICD-10-CM | POA: Insufficient documentation

## 2013-05-06 DIAGNOSIS — R0989 Other specified symptoms and signs involving the circulatory and respiratory systems: Secondary | ICD-10-CM | POA: Insufficient documentation

## 2013-05-06 DIAGNOSIS — Z9221 Personal history of antineoplastic chemotherapy: Secondary | ICD-10-CM | POA: Insufficient documentation

## 2013-05-06 DIAGNOSIS — I219 Acute myocardial infarction, unspecified: Secondary | ICD-10-CM

## 2013-05-06 DIAGNOSIS — I4729 Other ventricular tachycardia: Secondary | ICD-10-CM | POA: Insufficient documentation

## 2013-05-06 DIAGNOSIS — J209 Acute bronchitis, unspecified: Secondary | ICD-10-CM | POA: Insufficient documentation

## 2013-05-06 DIAGNOSIS — I10 Essential (primary) hypertension: Secondary | ICD-10-CM | POA: Insufficient documentation

## 2013-05-06 DIAGNOSIS — Z950 Presence of cardiac pacemaker: Secondary | ICD-10-CM | POA: Insufficient documentation

## 2013-05-06 DIAGNOSIS — R0609 Other forms of dyspnea: Secondary | ICD-10-CM | POA: Insufficient documentation

## 2013-05-06 DIAGNOSIS — R29898 Other symptoms and signs involving the musculoskeletal system: Secondary | ICD-10-CM | POA: Insufficient documentation

## 2013-05-06 DIAGNOSIS — R05 Cough: Secondary | ICD-10-CM | POA: Insufficient documentation

## 2013-05-06 DIAGNOSIS — I517 Cardiomegaly: Secondary | ICD-10-CM | POA: Insufficient documentation

## 2013-05-06 DIAGNOSIS — Z95810 Presence of automatic (implantable) cardiac defibrillator: Secondary | ICD-10-CM | POA: Insufficient documentation

## 2013-05-06 DIAGNOSIS — I513 Intracardiac thrombosis, not elsewhere classified: Secondary | ICD-10-CM

## 2013-05-06 DIAGNOSIS — R059 Cough, unspecified: Secondary | ICD-10-CM | POA: Insufficient documentation

## 2013-05-06 DIAGNOSIS — Z79899 Other long term (current) drug therapy: Secondary | ICD-10-CM | POA: Insufficient documentation

## 2013-05-06 MED ORDER — DOXYCYCLINE HYCLATE 50 MG PO CAPS: 100.0000 mg | ORAL_CAPSULE | Freq: Two times a day (BID) | ORAL | Status: DC

## 2013-05-06 NOTE — Progress Notes (Signed)
Patient ID: Andre Tran, male   DOB: [birth year], 52 y.o.   MRN: [id number]  Physicians EP : Andre Tran [facility]: Andre Tran Oncologist: Andre Tran   HPI: 52 y.o. 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  He has h/o recurrent lymphoma (1993, 2007) treated with chemo (including adriamycin) - details unclear.  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   12/16/11 ECHO EF 15% global HK. No apical thrombus.  12/04/12 ECHO EF 15% RV mildly hypokinetic (reviewed personally)  Evaluated in [facility] ED 11/02/12 due to dyspnea and later discharged. He had been out of lasix for 1 month. He was restarted on lasix 40 mg daily. CEs negative. Pro BNP 1633.   12/12/12 CPX Peak VO2: 16.9 ml/kg/min % predicted peak VO2: 46.9% VE/VCO2 slope: 33.3 OUES: 1.36 Peak RER: 1.19 Moderate to severe functional limitation due to heart failure.  Labs 01/16/13 K 4.7 Creatinine 1.1 Labs 03/21/13 Dig level 0.3   He returns for an acute work in. He was evaluated in 05/03/13 Andre Tran  for acute systolic heart failure and he was given IV lasix with recommendations for hospital admit however he declined.  Mild dyspnea with exertion. Does admit to dyspnea going up steps and hills. + Orthopnea sleeps on 3 pillows. Denies PND. Complaining of a productive cough with green/yellow sputum. Weight at home 153- 157 pounds. Taking all his medications but only taking carvedilol 6.25 mg twice a day.   Last seen by Andre. Clelia Tran 02/2013 and CT showed no findings for lymphoma involving the chest.  SH: Lives with wife in [place]  ROS: All systems negative except as listed in HPI, PMH and Problem List.  Past Medical History  Diagnosis Date  . Paroxysmal ventricular tachycardia     s/p AICD '05, replaced w/ St. Jude's in 2010 (PVT/V.Fib arrest requiring ICD implant in 2005)  .  HYPERTENSION, UNSPECIFIED   . HYPERLIPIDEMIA-MIXED   . 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 Nonischemic cardiomyopathy     2/2 Adriamycin administration for lymphoma  . CHF (congestive heart failure)     EF 15% by echo 2009; s/p St. Jude ICD  . Diabetes mellitus     Type 2  . Cancer      non hodkins lymphoma 1992, Hodgkins 2007  . Depression   . LV (left ventricular) mural thrombus     2009, on coumadin  . Small bowel obstruction     s/p Small bowel resection 2001  . Jejunal intussusception     2008  . 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 Splenic mass     Noted on Abd [place] 2009 w/ recs for f/u CT - not done    Current Outpatient Prescriptions  Medication Sig Dispense Refill  . aspirin 81 MG tablet Take 81 mg by mouth every morning.       . carvedilol (COREG) 6.25 MG tablet Take 6.25 mg by mouth 2 (two) times daily with a meal.      . digoxin (LANOXIN) 0.125 MG tablet Take 125 mcg by mouth every morning.       . furosemide (LASIX) 40 MG tablet Take 40 mg by mouth every morning.      Marland Kitchen  insulin glargine (LANTUS) 100 UNIT/ML injection Inject 30 Units into the skin at bedtime.       Marland Kitchen lisinopril (PRINIVIL,ZESTRIL) 5 MG tablet Take 5 mg by mouth 2 (two) times daily.      . magnesium oxide (MAG-OX) 400 MG tablet Take 400 mg by mouth every morning.       Marland Kitchen PARoxetine (PAXIL) 40 MG tablet Take 20 mg by mouth every morning.       . pravastatin (PRAVACHOL) 40 MG tablet Take 40 mg by mouth every evening.       . Rivaroxaban 20 MG TABS Take 1 tablet by mouth every evening.       . sertraline (ZOLOFT) 25 MG tablet Take 25 mg by mouth every morning.       Marland Kitchen spironolactone (ALDACTONE) 25 MG tablet Take 25 mg by mouth every morning.       . docusate sodium (COLACE) 100 MG capsule Take 100 mg by mouth every morning.       Marland Kitchen oxyCODONE-acetaminophen  (ROXICET) 5-325 MG/5ML solution Take 5 mLs by mouth every 4 (four) hours as needed for pain.  500 mL  0  . promethazine (PHENERGAN) 25 MG tablet Take 1 tablet (25 mg total) by mouth every 6 (six) hours as needed for nausea.  12 tablet  0   No current facility-administered medications for this encounter.    PHYSICAL EXAM: Filed Vitals:   05/06/13 0846  BP: 88/54  Pulse: 80  Weight: 163 lb (73.936 kg)  SpO2: 100%   General:  Well appearing. No resp difficulty HEENT: normal Neck: supple. JVP 8 cm Carotids 2+ bilaterally; no bruits. No lymphadenopathy or thryomegaly appreciated. Cor: PMI normal. Regular rate & rhythm. No rubs, gallops or murmurs. Lungs: RML RLL LLL rhonchi Abdomen: soft, nontender, nondistended. No hepatosplenomegaly. No bruits or masses. Good bowel sounds. Extremities: no cyanosis, clubbing, rash, edema Neuro: alert & orientedx3, cranial nerves grossly intact. Moves all 4 extremities w/o difficulty. Affect pleasant.  ASSESSMENT/ PLAN: 1. Chronic systolic HF: NICM likely due to adriamycin; EF 15% (11/2012) ST Jude ICD.  NYHA II-III symptoms. Volume mildly up.  - Continue lasix 40 mg daily and spironolactone 25 mg daily. Take one extra dose of lasix today - Will not titrate any medications d/t soft BP. Continue coreg 6.25 m BID, lisinopril 5 mg bid, and digoxin 0.125 mg daily.  - Reinforced the need and importance of daily weights, a low sodium diet, and fluid restriction (less than 2 L a day). Instructed to call the HF clinic if weight increases more than 3 lbs overnight or 5 lbs in a week.  - Followup 2 wks with BMET.  2. H/o LV thrombus - Continue Xarelto, may stop ASA.  3. Recurrent lymphoma (1993, 2007): seen recently by Andre. Clelia Tran and CT showed no findings for lymphoma involving the chest. 4. [facility]lipidemia: [facility] 5. URI: Suspect acute bronchitis.  Check CXR and give doxycycline 100 mg twice a day for a week.  He may take over the counter Robitussin  and coricidin.   Follow up in 2 weeks  Andre Tran 05/06/2013 9:48 AM

## 2013-05-06 NOTE — Patient Instructions (Signed)
Take Doxycycline 100 mg twice a day  Take an extra 40 mg of lasix today  You may take plain robitussin or chloridedin  Do the following things EVERYDAY: 1) Weigh yourself in the morning before breakfast. Write it down and keep it in a log. 2) Take your medicines as prescribed 3) Eat low salt foods-Limit salt (sodium) to 2000 mg per day.  4) Stay as active as you can everyday 5) Limit all fluids for the day to less than 2 liters    Follow up in 2 weeks

## 2013-05-20 ENCOUNTER — Ambulatory Visit (HOSPITAL_COMMUNITY)
Admission: RE | Admit: 2013-05-20 | Discharge: 2013-05-20 | Disposition: A | Discharge status: Home / Self Care | Payer: Medicare Other | Source: Ambulatory Visit | Attending: Internal Medicine | Admitting: Internal Medicine

## 2013-05-20 VITALS — BP 90/60 | HR 87 | Wt 164.0 lb

## 2013-05-20 DIAGNOSIS — I959 Hypotension, unspecified: Secondary | ICD-10-CM | POA: Insufficient documentation

## 2013-05-20 DIAGNOSIS — I472 Ventricular tachycardia: Secondary | ICD-10-CM

## 2013-05-20 DIAGNOSIS — I219 Acute myocardial infarction, unspecified: Secondary | ICD-10-CM | POA: Insufficient documentation

## 2013-05-20 DIAGNOSIS — I513 Intracardiac thrombosis, not elsewhere classified: Secondary | ICD-10-CM

## 2013-05-20 DIAGNOSIS — I5022 Chronic systolic (congestive) heart failure: Secondary | ICD-10-CM | POA: Insufficient documentation

## 2013-05-20 LAB — BASIC METABOLIC PANEL
GFR calc Af Amer: >90 mL/min (ref >90)
GFR calc non Af Amer: >90 mL/min (ref >90)
Potassium: 4.7 mEq/L (ref 3.5–5.1)
Sodium: 134 mEq/L — ABNORMAL LOW (ref 135–145)

## 2013-05-20 NOTE — Patient Instructions (Signed)
Stop aspirin  Follow up  2 months  Do the following things EVERYDAY: 1) Weigh yourself in the morning before breakfast. Write it down and keep it in a log. 2) Take your medicines as prescribed 3) Eat low salt foods-Limit salt (sodium) to 2000 mg per day.  4) Stay as active as you can everyday 5) Limit all fluids for the day to less than 2 liters

## 2013-05-20 NOTE — Progress Notes (Signed)
Patient ID: Andre Tran, male   DOB: 1961/01/28, 52 y.o.   MRN: [id number]  Physicians EP : Dr Ladona Ridgel [name] Oncologist: Dr Clelia Croft   HPI: 52 y.o. 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  He has h/o recurrent lymphoma (1993, 2007) treated with chemo (including adriamycin) - details unclear.  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   12/16/11 ECHO EF 15% global HK. No apical thrombus.  12/04/12 ECHO EF 15% RV mildly hypokinetic (reviewed personally)  Evaluated in [facility] ED 11/02/12 due to dyspnea and later discharged. He had been out of lasix for 1 month. He was restarted on lasix 40 mg daily. CEs negative. Pro BNP 1633.   12/12/12 CPX Peak VO2: 16.9 ml/kg/min % predicted peak VO2: 46.9% VE/VCO2 slope: 33.3 OUES: 1.36 Peak RER: 1.19 Moderate to severe functional limitation due to heart failure.  Labs 01/16/13 K 4.7 Creatinine 1.1 Labs 03/21/13 Dig level 0.3   Last seen by Dr. Clelia Croft 02/2013 and CT showed no findings for lymphoma involving the chest.  He was evaluated in 05/03/13 [facility]  for acute systolic heart failure and he was given IV lasix with recommendations for hospital admit however he declined.    He returns for follow up. Last visit he was started on doxycycline due to URI.  Overall she is feeling much better. Denies SOB/PND/Orthopnea. + Orthopnea sleeps on 3 pillows. Taking all medications. Weight at home 157 pounds.     SH: Lives with wife in [place]  ROS: All systems negative except as listed in HPI, PMH and Problem List.  Past Medical History  Diagnosis Date  . Paroxysmal ventricular tachycardia     s/p AICD '05, replaced w/ St. Jude's in 2010 (PVT/V.Fib arrest requiring ICD implant in 2005)  . HYPERTENSION, UNSPECIFIED   . HYPERLIPIDEMIA-MIXED   . CVA     2008 Right basal  ganglia infarct, TPA and unsuccessful attempt at clot retrieval with hemorrhagic conversion, residual right-sided weakness  . Nonischemic cardiomyopathy     2/2 Adriamycin administration for lymphoma  . CHF (congestive heart failure)     EF 15% by echo 2009; s/p St. Jude ICD  . Diabetes mellitus     Type 2  . Cancer      non hodkins lymphoma 1992, Hodgkins 2007  . Depression   . LV (left ventricular) mural thrombus     2009, on coumadin  . Small bowel obstruction     s/p Small bowel resection 2001  . Jejunal intussusception     2008  . 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 Splenic mass     Noted on Abd [place] 2009 w/ recs for f/u CT - not done    Current Outpatient Prescriptions  Medication Sig Dispense Refill  . aspirin 81 MG tablet Take 81 mg by mouth every morning.       . carvedilol (COREG) 6.25 MG tablet Take 6.25 mg by mouth 2 (two) times daily with a meal.      . digoxin (LANOXIN) 0.125 MG tablet Take 125 mcg by mouth every morning.       . furosemide (LASIX) 40 MG tablet Take 40 mg by mouth every morning.      . insulin glargine (LANTUS) 100 UNIT/ML injection Inject  30 Units into the skin at bedtime.       Marland Kitchen lisinopril (PRINIVIL,ZESTRIL) 5 MG tablet Take 5 mg by mouth 2 (two) times daily.      . pravastatin (PRAVACHOL) 40 MG tablet Take 40 mg by mouth every evening.       . Rivaroxaban 20 MG TABS Take 1 tablet by mouth every evening.       . sertraline (ZOLOFT) 25 MG tablet Take 25 mg by mouth every morning.       Marland Kitchen spironolactone (ALDACTONE) 25 MG tablet Take 25 mg by mouth every morning.       . docusate sodium (COLACE) 100 MG capsule Take 100 mg by mouth every morning.       . magnesium oxide (MAG-OX) 400 MG tablet Take 400 mg by mouth every morning.       Marland Kitchen oxyCODONE-acetaminophen (ROXICET) 5-325 MG/5ML solution Take 5 mLs by mouth every 4 (four) hours as needed for pain.  500 mL  0  . promethazine (PHENERGAN) 25 MG tablet Take 1  tablet (25 mg total) by mouth every 6 (six) hours as needed for nausea.  12 tablet  0   No current facility-administered medications for this encounter.    PHYSICAL EXAM: Filed Vitals:   05/20/13 0951  BP: 90/60  Pulse: 87  Weight: 164 lb (74.39 kg)  SpO2: 100%   General:  Well appearing. No resp difficulty Wife present  HEENT: normal Neck: supple. JVP 5-6 Carotids 2+ bilaterally; no bruits. No lymphadenopathy or thryomegaly appreciated. Cor: PMI normal. Regular rate & rhythm. No rubs, gallops or murmurs. Lungs: Clear  Abdomen: soft, nontender, nondistended. No hepatosplenomegaly. No bruits or masses. Good bowel sounds. Extremities: no cyanosis, clubbing, rash, edema Neuro: alert & orientedx3, cranial nerves grossly intact. Moves all 4 extremities w/o difficulty. Affect pleasant.  ASSESSMENT/ PLAN: 1. Chronic systolic HF: NICM likely due to adriamycin; EF 15% (11/2012) ST Jude ICD.  NYHA II symptoms. Volume stable.  Continue lasix 40 mg daily and spironolactone 25 mg daily.  - Will not titrate any medications d/t soft BP. Continue coreg 6.25 m BID, lisinopril 5 mg bid, and digoxin 0.125 mg daily.   - Reinforced the need and importance of daily weights, a low sodium diet, and fluid restriction (less than 2 L a day). Instructed to call the HF clinic if weight increases more than 3 lbs overnight or 5 lbs in a week.  -Check BMET today 2. H/o LV thrombus - Continue Xarelto, may stop ASA.  3. Recurrent lymphoma (1993, 2007): seen recently by Dr. Clelia Croft and CT showed no findings for lymphoma involving the chest. 4. Hyperlipidemia: [facility] 5. URI: resolved with antibiotics.    Follow up in 2 months   CLEGG,AMY NP-C 05/20/2013 10:02 AM

## 2013-05-21 ENCOUNTER — Telehealth (HOSPITAL_COMMUNITY): Payer: Self-pay | Admitting: Adult Health

## 2013-05-21 NOTE — Telephone Encounter (Signed)
Contacted his wife regarding elevated Glucose 256 .  She says he has a PCP at the [place] but not local.  She plans to refer him to her PCP which is  Dr Darliss Cheney.   CLEGG,AMY 4:40 PM

## 2013-06-20 ENCOUNTER — Emergency Department (HOSPITAL_COMMUNITY): Payer: Medicare Other

## 2013-06-20 ENCOUNTER — Encounter (HOSPITAL_COMMUNITY): Payer: Self-pay | Admitting: Emergency Medicine

## 2013-06-20 ENCOUNTER — Emergency Department (HOSPITAL_COMMUNITY)
Admission: EM | Admit: 2013-06-20 | Discharge: 2013-06-20 | Disposition: A | Discharge status: Home / Self Care | Payer: Medicare Other | Attending: Emergency Medicine | Admitting: Emergency Medicine

## 2013-06-20 DIAGNOSIS — F3289 Other specified depressive episodes: Secondary | ICD-10-CM | POA: Insufficient documentation

## 2013-06-20 DIAGNOSIS — Z87898 Personal history of other specified conditions: Secondary | ICD-10-CM | POA: Insufficient documentation

## 2013-06-20 DIAGNOSIS — Z794 Long term (current) use of insulin: Secondary | ICD-10-CM | POA: Insufficient documentation

## 2013-06-20 DIAGNOSIS — E782 Mixed hyperlipidemia: Secondary | ICD-10-CM | POA: Insufficient documentation

## 2013-06-20 DIAGNOSIS — J4489 Other specified chronic obstructive pulmonary disease: Secondary | ICD-10-CM | POA: Insufficient documentation

## 2013-06-20 DIAGNOSIS — I509 Heart failure, unspecified: Secondary | ICD-10-CM

## 2013-06-20 DIAGNOSIS — J209 Acute bronchitis, unspecified: Secondary | ICD-10-CM

## 2013-06-20 DIAGNOSIS — F329 Major depressive disorder, single episode, unspecified: Secondary | ICD-10-CM | POA: Insufficient documentation

## 2013-06-20 DIAGNOSIS — J449 Chronic obstructive pulmonary disease, unspecified: Secondary | ICD-10-CM | POA: Insufficient documentation

## 2013-06-20 DIAGNOSIS — J40 Bronchitis, not specified as acute or chronic: Secondary | ICD-10-CM

## 2013-06-20 DIAGNOSIS — Z8719 Personal history of other diseases of the digestive system: Secondary | ICD-10-CM | POA: Insufficient documentation

## 2013-06-20 DIAGNOSIS — I10 Essential (primary) hypertension: Secondary | ICD-10-CM | POA: Insufficient documentation

## 2013-06-20 DIAGNOSIS — Z86718 Personal history of other venous thrombosis and embolism: Secondary | ICD-10-CM | POA: Insufficient documentation

## 2013-06-20 DIAGNOSIS — Z79899 Other long term (current) drug therapy: Secondary | ICD-10-CM | POA: Insufficient documentation

## 2013-06-20 DIAGNOSIS — Z95810 Presence of automatic (implantable) cardiac defibrillator: Secondary | ICD-10-CM | POA: Insufficient documentation

## 2013-06-20 DIAGNOSIS — E119 Type 2 diabetes mellitus without complications: Secondary | ICD-10-CM | POA: Insufficient documentation

## 2013-06-20 DIAGNOSIS — R059 Cough, unspecified: Secondary | ICD-10-CM

## 2013-06-20 DIAGNOSIS — R05 Cough: Secondary | ICD-10-CM | POA: Insufficient documentation

## 2013-06-20 DIAGNOSIS — Z8673 Personal history of transient ischemic attack (TIA), and cerebral infarction without residual deficits: Secondary | ICD-10-CM | POA: Insufficient documentation

## 2013-06-20 HISTORY — DX: Hypotension, unspecified: I95.9

## 2013-06-20 LAB — PRO B NATRIURETIC PEPTIDE: Pro B Natriuretic peptide (BNP): 3235 pg/mL — ABNORMAL HIGH (ref 0–125)

## 2013-06-20 LAB — CBC WITH DIFFERENTIAL/PLATELET
Hemoglobin: 11.2 g/dL — ABNORMAL LOW (ref 13.0–17.0)
Lymphs Abs: 1.3 10*3/uL (ref 0.7–4.0)
Monocytes Relative: 10 % (ref 3–12)
Neutro Abs: 4.2 10*3/uL (ref 1.7–7.7)
Neutrophils Relative %: 63 % (ref 43–77)
RBC: 3.65 MIL/uL — ABNORMAL LOW (ref 4.22–5.81)
WBC: 6.7 10*3/uL (ref 4.0–10.5)

## 2013-06-20 LAB — BASIC METABOLIC PANEL
BUN: 12 mg/dL (ref 6–23)
Chloride: 104 mEq/L (ref 96–112)
GFR calc Af Amer: >90 mL/min (ref >90)
Glucose, Bld: 105 mg/dL — ABNORMAL HIGH (ref 70–99)
Potassium: 3.4 mEq/L — ABNORMAL LOW (ref 3.5–5.1)

## 2013-06-20 MED ORDER — FUROSEMIDE 10 MG/ML IJ SOLN: 40.0000 mg | Freq: Once | INTRAMUSCULAR | Status: DC

## 2013-06-20 MED ORDER — PREDNISONE 20 MG PO TABS
60.0000 mg | ORAL_TABLET | Freq: Once | ORAL | Status: AC
  Administered 2013-06-20: 60 mg via ORAL
  Filled 2013-06-20: qty 3

## 2013-06-20 MED ORDER — ALBUTEROL (5 MG/ML) CONTINUOUS INHALATION SOLN
10.0000 mg/h | INHALATION_SOLUTION | Freq: Once | RESPIRATORY_TRACT | Status: AC
  Administered 2013-06-20: 10 mg/h via RESPIRATORY_TRACT
  Filled 2013-06-20: qty 20

## 2013-06-20 MED ORDER — ALBUTEROL SULFATE HFA 108 (90 BASE) MCG/ACT IN AERS
2.0000 | INHALATION_SPRAY | RESPIRATORY_TRACT | Status: AC
  Administered 2013-06-20: 2 via RESPIRATORY_TRACT
  Filled 2013-06-20: qty 6.7

## 2013-06-20 MED ORDER — FUROSEMIDE 80 MG PO TABS
80.0000 mg | ORAL_TABLET | Freq: Once | ORAL | Status: AC
  Administered 2013-06-20: 80 mg via ORAL
  Filled 2013-06-20: qty 1

## 2013-06-20 MED ORDER — IPRATROPIUM BROMIDE 0.02 % IN SOLN
1.0000 mg | Freq: Once | RESPIRATORY_TRACT | Status: AC
  Administered 2013-06-20: 1 mg via RESPIRATORY_TRACT
  Filled 2013-06-20: qty 5

## 2013-06-20 NOTE — ED Notes (Signed)
Patient transported to X-ray 

## 2013-06-20 NOTE — Consult Note (Addendum)
[facility] Medical Consultation  Andre Tran ZOX:[id number] DOB: Sep 27, 1960 DOA: 06/20/2013 PCP: Default, Provider, MD   Requesting physician: Andre Tran, EDP Date of consultation: 06/20/13 Reason for consultation: Heart failure exacerbation with some SOB  Chief Complaint: SOB x 3-4 days with sputum  I discussed the patient's case with advanced heart failure clinician Andre Tran recommends that the patient can be discharged home with a slightly higher dose of Lasix up from 40 once daily to 40 twice a day I doubt that this is an infectious process, he potentially has a viral bronchitis as he has no fever nor chills and his chest x-ray is normal His baseline weight is 164 pounds, today he is 71 Wife and patient are agreeable to close followup with advanced heart clinic, and heart failure clinician Andre Tran  has indicated that she will arrange the same and call him with an appointment  I will relinquish care back to Dr. Clarene Tran   HPI:  52 y.o. ? w/ PMHx  chronic systolic CHF chemotherapy- CM EF 15%, h/o VT/VF s/p SJM ICD (replaced in 2010), LV Thrombus (on Xarelto), and CVA '08 c residual right-sided weakness presented to the emergency room 06/20/2013 with shortness of breath/cough times 4 days. He states that he was recently seen at advanced heart failure clinic maybe about "2-3 weeks ago"after a trip to [place] with his wife-he thinks he developed a slight cough and cold -l went to advanced heart clinic 05/20/13 pand given doxycycline 100 twice a day x7 days and told to followup at that time. He kept the follow appointment but then seemed to become more short of breath over the past a couple of days. He states that he became mildly short of breath at [facility] and needed to sit in the wheelchair and usually can walk a long distance. When he does have exacerbations he usually has a lower extremity swelling and paroxysmal nocturnal dyspnea which he does not have at this time He has  no chest pain He has clear sputum which has turned green and has had no fever or chills His 2 twin sons have both had upper respiratory symptoms as well He denies specifically any overt chest pain, swelling of lower extremities, blurred vision double vision diarrhea weakness other than the weakness on his right side which is chronic from his higher stroke  Social history he used to be in the [employer]	[place] and retired as a Chief Strategy Officer he's been married for 23 years to the same woman Does not smoke he does not drink does not do drugs  Family history significant for gland mother/great-grandmother having a stroke in her 18s Paternal grandmother had hip surgery at 64 Mother as well Father's family history is unknown   Review of Systems:  See above In addition no rash no chills no rigors no throat issues  no neck stiffness Not right there is no other issues  Past Medical History  Diagnosis Date  . Paroxysmal ventricular tachycardia     s/p AICD '05, replaced w/ St. Jude's in 2010 (PVT/V.Fib arrest requiring ICD implant in 2005)  . HYPERTENSION, UNSPECIFIED   . HYPERLIPIDEMIA-MIXED   . 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 Nonischemic cardiomyopathy     2/2 Adriamycin administration for lymphoma  . CHF (congestive heart failure)     EF 15% by echo 2009; s/p St. Jude ICD  . Diabetes mellitus  Type 2  . Cancer      non hodkins lymphoma 1992, Hodgkins 2007  . Depression   . LV (left ventricular) mural thrombus     2009, on coumadin  . Small bowel obstruction     s/p Small bowel resection 2001  . Jejunal intussusception     2008  . 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 Splenic mass     Noted on Abd [place] 2009 w/ recs for f/u CT - not done  . Hypotension    Past Surgical History  Procedure Laterality Date  . Cardiac defibrillator  placement      2005, replaced w/ St. Jude's 2010  . Vasectomy    . Bowel resection      small bowell 2/2 obstruction 2001  . Pacemaker insertion     Social History:  reports that he has never smoked. He has never used smokeless tobacco. He reports that he does not drink alcohol or use illicit drugs.  No Known Allergies Family History  Problem Relation Age of Onset  . Other      No known family h/o heart disease  . Diabetes Mother     Prior to Admission medications   Medication Sig Start Date End Date Taking? Authorizing Provider  albuterol (PROVENTIL) (2.5 MG/3ML) 0.083% nebulizer solution Take 2.5 mg by nebulization every 6 (six) hours as needed for wheezing or shortness of breath.   Yes Historical Provider, MD  carvedilol (COREG) 6.25 MG tablet Take 6.25 mg by mouth 2 (two) times daily with a meal. 02/05/13  Yes Andre Patty, MD  digoxin (LANOXIN) 0.125 MG tablet Take 125 mcg by mouth every morning.  12/13/11  Yes Andre A Hope, PA-C  furosemide (LASIX) 40 MG tablet Take 40 mg by mouth every morning.   Yes Historical Provider, MD  insulin glargine (LANTUS) 100 UNIT/ML injection Inject 40 Units into the skin at bedtime.    Yes Historical Provider, MD  lisinopril (PRINIVIL,ZESTRIL) 5 MG tablet Take 5 mg by mouth 2 (two) times daily.   Yes Historical Provider, MD  pravastatin (PRAVACHOL) 40 MG tablet Take 40 mg by mouth every evening.    Yes Historical Provider, MD  Pseudoeph-CPM-DM-APAP (THERAFLU FLU/COLD/COUGH PO) Take 1 packet by mouth every 4 (four) hours as needed (flu like symptoms).   Yes Historical Provider, MD   Physical Exam: Blood pressure 102/71, pulse 90, temperature 98.6 F (37 C), temperature source Oral, resp. rate 20, SpO2 100.00%. Filed Vitals:   06/20/13 0715  BP: 102/71  Pulse: 90  Temp: 98.6 F (37 C)  Resp: 20   Alert pleasant oriented  General:  EOMI  Eyes: NCAT EOMI  ENT: Throat clear moderate dentition  Neck: Soft supple no  JVD  Cardiovascular: S1-S2 no murmur rub or gallop  Respiratory: Fine crackles posterior basal  Abdomen: Soft nontender  Skin: No lower extremity edema in fact he's appears a little dry  Musculoskeletal: Range of motion intact  Psychiatric: Euthymic  Neurologic: Grossly intact moving all 4 extremities equally  Labs on Admission:  Basic Metabolic Panel:  Recent Labs Lab 06/20/13 0750  NA 137  K 3.4*  CL 104  CO2 24  GLUCOSE 105*  BUN 12  CREATININE 0.94  CALCIUM 9.4   Liver Function Tests: No results found for this basename: AST, ALT, ALKPHOS, BILITOT, PROT, ALBUMIN,  in the last 168 hours No results found for this basename: LIPASE, AMYLASE,  in the last 168 hours No results found for this basename: AMMONIA,  in the last 168 hours CBC:  Recent Labs Lab 06/20/13 0750  WBC 6.7  NEUTROABS 4.2  HGB 11.2*  HCT 33.4*  MCV 91.5  PLT 216   Cardiac Enzymes:  Recent Labs Lab 06/20/13 0750  TROPONINI <0.30   BNP: No components found with this basename: POCBNP,  CBG: No results found for this basename: GLUCAP,  in the last 168 hours  Radiological Exams on Admission: Dg Chest 2 View  06/20/2013   CLINICAL DATA:  Cough and congestion.  EXAM: CHEST  2 VIEW  COMPARISON:  PA and lateral chest 05/06/2013 and CT chest 01/22/2013.  FINDINGS: AICD is in place. Heart size is upper normal. The lungs are clear. No pleural or pericardial effusion.  IMPRESSION: No acute disease.   Electronically Signed   By: Drusilla Kanner M.D.   On: 06/20/2013 07:58    EKG: Independently reviewed. Sinus rhythm PR interval 0.12 QRS axis -50 no ST-T wave acute changes  Time spent: 47  Mahala Menghini [facility] Pager [phone]  If 7PM-7AM, please contact night-coverage [email or web address] Password [email or web address] 06/20/2013, 10:01 AM

## 2013-06-20 NOTE — ED Notes (Signed)
Pt complains of a cough and shortness of breath for three days, had a neb treatment last night and was able to sleep through the night but started having a productive cough as soon as he woke up this am.

## 2013-06-20 NOTE — Progress Notes (Signed)
   CARE MANAGEMENT ED NOTE 06/20/2013  Patient:  Andre Tran, Andre Tran   Account Number:  [id number]  Date Initiated:  06/20/2013  Documentation initiated by:  Edd Arbour  Subjective/Objective Assessment:   53 yr old male medicare pt c/o sob x 3-4 days with sputum     Subjective/Objective Assessment Detail:   pcp Ulla Potash, NP  Requesting physician: Clarene Duke, EDP  Date of consultation: 06/20/13  Reason for consultation: Heart failure exacerbation with some SOB  Wife and patient are agreeable to close followup with advanced heart clinic and ED d/c     Action/Plan:   ED Cm entered in pcp for pt   Action/Plan Detail:   Anticipated DC Date:  06/20/2013     Status Recommendation to Physician:   Result of Recommendation:    Other ED Services  Consult Working Plan    DC Planning Services  Other  Outpatient Services - Pt will follow up  PCP issues    Choice offered to / List presented to:            Status of service:  Completed, signed off  ED Comments:   ED Comments Detail:

## 2013-06-20 NOTE — ED Provider Notes (Signed)
CSN: [id number]     Arrival date & time 06/20/13  0700 History   First MD Initiated Contact with Patient 06/20/13 [phone]     Chief Complaint  Patient presents with  . Cough    HPI Pt was seen at 0715.  Per pt, c/o gradual onset and worsening of persistent cough, wheezing and SOB for the past 4 days.  Describes his symptoms as "my COPD might me acting up." Has been associated with constant chest pain for 4 days; described as "sore from coughing" and "my lungs hurt."  Has been using home nebs with transient relief. Pt states he has significant hx of COPD and CHF. Denies increasing pedal edema, no palpitations, no back pain, no abd pain, no N/V/D, no fevers, no rash.     Past Medical History  Diagnosis Date  . Paroxysmal ventricular tachycardia     s/p AICD '05, replaced w/ St. Jude's in 2010 (PVT/V.Fib arrest requiring ICD implant in 2005)  . HYPERTENSION, UNSPECIFIED   . HYPERLIPIDEMIA-MIXED   . 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 Nonischemic cardiomyopathy     2/2 Adriamycin administration for lymphoma  . CHF (congestive heart failure)     EF 15% by echo 2009; s/p St. Jude ICD  . Diabetes mellitus     Type 2  . Cancer      non hodkins lymphoma 1992, Hodgkins 2007  . Depression   . LV (left ventricular) mural thrombus     2009, on coumadin  . Small bowel obstruction     s/p Small bowel resection 2001  . Jejunal intussusception     2008  . 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 Splenic mass     Noted on Abd [place] 2009 w/ recs for f/u CT - not done   Past Surgical History  Procedure Laterality Date  . Cardiac defibrillator placement      2005, replaced w/ St. Jude's 2010  . Vasectomy    . Bowel resection      small bowell 2/2 obstruction 2001  . Pacemaker insertion     Family History  Problem Relation Age of Onset  . Other       No known family h/o heart disease  . Diabetes Mother    History  Substance Use Topics  . Smoking status: Never Smoker   . Smokeless tobacco: Never Used  . Alcohol Use: No    Review of Systems ROS: Statement: All systems negative except as marked or noted in the HPI; Constitutional: Negative for fever and chills. ; ; Eyes: Negative for eye pain, redness and discharge. ; ; ENMT: Negative for ear pain, hoarseness, nasal congestion, sinus pressure and sore throat. ; ; Cardiovascular: Negative for palpitations, diaphoresis, and peripheral edema. ; ; Respiratory: +CP, SOB, cough, wheezing. Negative for stridor. ; ; Gastrointestinal: Negative for nausea, vomiting, diarrhea, abdominal pain, blood in stool, hematemesis, jaundice and rectal bleeding. . ; ; Genitourinary: Negative for dysuria, flank pain and hematuria. ; ; Musculoskeletal: Negative for back pain and neck pain. Negative for swelling and trauma.; ; Skin: Negative for pruritus, rash, abrasions, blisters, bruising and skin lesion.; ; Neuro: Negative for headache, lightheadedness and neck stiffness. Negative for weakness, altered level of consciousness , altered mental status, extremity weakness, paresthesias, involuntary movement, seizure and syncope.  Allergies  Review of patient's allergies indicates no known allergies.  Home Medications   Current Outpatient Rx  Name  Route  Sig  Dispense  Refill  . carvedilol (COREG) 6.25 MG tablet   Oral   Take 6.25 mg by mouth 2 (two) times daily with a meal.         . digoxin (LANOXIN) 0.125 MG tablet   Oral   Take 125 mcg by mouth every morning.          . docusate sodium (COLACE) 100 MG capsule   Oral   Take 100 mg by mouth every morning.          . furosemide (LASIX) 40 MG tablet   Oral   Take 40 mg by mouth every morning.         . insulin glargine (LANTUS) 100 UNIT/ML injection   Subcutaneous   Inject 30 Units into the skin at bedtime.          Marland Kitchen lisinopril  (PRINIVIL,ZESTRIL) 5 MG tablet   Oral   Take 5 mg by mouth 2 (two) times daily.         . magnesium oxide (MAG-OX) 400 MG tablet   Oral   Take 400 mg by mouth every morning.          Marland Kitchen oxyCODONE-acetaminophen (ROXICET) 5-325 MG/5ML solution   Oral   Take 5 mLs by mouth every 4 (four) hours as needed for pain.   500 mL   0   . pravastatin (PRAVACHOL) 40 MG tablet   Oral   Take 40 mg by mouth every evening.          . promethazine (PHENERGAN) 25 MG tablet   Oral   Take 1 tablet (25 mg total) by mouth every 6 (six) hours as needed for nausea.   12 tablet   0   . Rivaroxaban 20 MG TABS   Oral   Take 1 tablet by mouth every evening.          . sertraline (ZOLOFT) 25 MG tablet   Oral   Take 25 mg by mouth every morning.          Marland Kitchen spironolactone (ALDACTONE) 25 MG tablet   Oral   Take 25 mg by mouth every morning.           BP 102/71  Pulse 90  Temp(Src) 98.6 F (37 C) (Oral)  Resp 20  SpO2 98% Physical Exam 0720: Physical examination:  Nursing notes reviewed; Vital signs and O2 SAT reviewed;  Constitutional: Well developed, Well nourished, Well hydrated, In no acute distress; Head:  Normocephalic, atraumatic; Eyes: EOMI, PERRL, No scleral icterus; ENMT: Mouth and pharynx normal, Mucous membranes moist; Neck: Supple, Full range of motion, No lymphadenopathy; Cardiovascular: Regular rate and rhythm, No gallop; Respiratory: Breath sounds diminished & equal bilaterally, faint wheezes bilat. No audible wheezing. Non-productive cough during exam. Speaking full sentences, No retrax or access mm use. Normal respiratory effort/excursion; Chest: Nontender, Movement normal; Abdomen: Soft, Nontender, Nondistended, Normal bowel sounds; Genitourinary: No CVA tenderness; Extremities: Pulses normal, No tenderness, No edema, No calf edema or asymmetry.; Neuro: AA&Ox3, Major CN grossly intact.  Speech clear. Mild right sided weakness per hx of same, otherwise moves all extremities on  stretcher..; Skin: Color normal, Warm, Dry.   ED Course  Procedures   EKG Interpretation    Date/Time:  Thursday June 20 2013 07:19:50 EST Ventricular Rate:  88 PR Interval:  213 QRS Duration: 97 QT  Interval:  388 QTC Calculation: 469 R Axis:   -57 Text Interpretation:  Sinus rhythm with 1st degree A-V block Left axis deviation Left anterior fascicular block Consider anterior infarct,  , age undetermined T wave abnormality Lateral leads Abnormal ECG No significant change since last tracing dated 11/02/2012 Confirmed by [facility]  MD, Nicholos Johns [phone]) on 06/20/2013 9:18:19 AM            MDM  MDM Reviewed: previous chart, nursing note and vitals Reviewed previous: labs and ECG Interpretation: labs, ECG and x-ray     Results for orders placed during the hospital encounter of 06/20/13  CBC WITH DIFFERENTIAL      Result Value Range   WBC 6.7  4.0 - 10.5 K/uL   RBC 3.65 (*) 4.22 - 5.81 MIL/uL   Hemoglobin 11.2 (*) 13.0 - 17.0 g/dL   HCT 29.5 (*) 62.1 - 30.8 %   MCV 91.5  78.0 - 100.0 fL   MCH 30.7  26.0 - 34.0 pg   MCHC 33.5  30.0 - 36.0 g/dL   RDW 65.7  84.6 - 96.2 %   Platelets 216  150 - 400 K/uL   Neutrophils Relative % 63  43 - 77 %   Neutro Abs 4.2  1.7 - 7.7 K/uL   Lymphocytes Relative 20  12 - 46 %   Lymphs Abs 1.3  0.7 - 4.0 K/uL   Monocytes Relative 10  3 - 12 %   Monocytes Absolute 0.7  0.1 - 1.0 K/uL   Eosinophils Relative 7 (*) 0 - 5 %   Eosinophils Absolute 0.4  0.0 - 0.7 K/uL   Basophils Relative 1  0 - 1 %   Basophils Absolute 0.1  0.0 - 0.1 K/uL  BASIC METABOLIC PANEL      Result Value Range   Sodium 137  135 - 145 mEq/L   Potassium 3.4 (*) 3.5 - 5.1 mEq/L   Chloride 104  96 - 112 mEq/L   CO2 24  19 - 32 mEq/L   Glucose, Bld 105 (*) 70 - 99 mg/dL   BUN 12  6 - 23 mg/dL   Creatinine, Ser 9.52  0.50 - 1.35 mg/dL   Calcium 9.4  8.4 - 84.1 mg/dL   GFR calc non Af Amer >90  >90 mL/min   GFR calc Af Amer >90  >90 mL/min  TROPONIN I      Result  Value Range   Troponin I <0.30  <0.30 ng/mL  PRO B NATRIURETIC PEPTIDE      Result Value Range   Pro B Natriuretic peptide (BNP) 3235.0 (*) 0 - 125 pg/mL   Dg Chest 2 View 06/20/2013   CLINICAL DATA:  Cough and congestion.  EXAM: CHEST  2 VIEW  COMPARISON:  PA and lateral chest 05/06/2013 and CT chest 01/22/2013.  FINDINGS: AICD is in place. Heart size is upper normal. The lungs are clear. No pleural or pericardial effusion.  IMPRESSION: No acute disease.   Electronically Signed   By: Drusilla Kanner M.D.   On: 06/20/2013 07:58    Results for DEMETRIE, BORGE (MRN [id number]) as of 06/20/2013 08:41  Ref. Range 12/17/2011 23:36 12/18/2011 00:04 11/02/2012 09:19 01/16/2013 10:41 06/20/2013 07:50  Hemoglobin Latest Range: 13.0-17.0 g/dL 25.3 66.4 40.3 (L) 47.4 (L) 11.2 (L)  HCT Latest Range: 39.0-52.0 % 38.8 (L) 44.0 34.1 (L) 38.7 33.4 (L)   Results for DAVIONNE, DOWTY (MRN [id number]) as of 06/20/2013 08:41  Ref. Range 12/17/2011 23:29  11/02/2012 09:23 06/20/2013 07:50  Pro B Natriuretic peptide (BNP) Latest Range: 0-125 pg/mL 1897.0 (H) 1633.0 (H) 3235.0 (H)    0910:  Pt stated on arrival that he had hx of COPD. Lungs were diminished with faint scattered wheezes, no audible wheezes. PO prednisone and neb given for pt's self reported hx of COPD. Pt's wife has now arrived to bedside. States pt does NOT have hx of COPD, she has a hx of asthma, and the neb machine is hers. States she has been giving the pt nebs for the past several days because he has been "SOB" and "wheezing." Neb is currently in progress. Pt's Sats are 100%, lungs are clearer bilat, no further wheezing. Pt endorses the neb tx is "making me feel better;" will continue it for now. BNP elevated from previous, will dose IV lasix. Pt endorses he has been compliant with is lasix at home. H/H stable. SBP near baseline, per EPIC chart review. Dx and testing d/w pt and family.  Questions answered.  Verb understanding, agreeable to observation  admit.  0935: T/C to [facility] Dr. Mahala Menghini, case discussed, including:  HPI, pertinent PM/SHx, VS/PE, dx testing, ED course and treatment:  Agreeable to come to ED for eval.  1030:  Pt has been evaluated by [facility] Dr. Mahala Menghini: states he has spoken with heart failure clinic clinician, they requested to increase pt's lasix to 40mg  BID, f/u in clinic on Monday, also tx for likely viral bronchitis, d/c pt from the ED today. Consult is written.            Laray Anger, DO 06/22/13 [phone]

## 2013-06-27 ENCOUNTER — Ambulatory Visit (HOSPITAL_COMMUNITY)
Admission: RE | Admit: 2013-06-27 | Discharge: 2013-06-27 | Disposition: A | Discharge status: Home / Self Care | Payer: Medicare Other | Source: Ambulatory Visit | Attending: Internal Medicine | Admitting: Internal Medicine

## 2013-06-27 VITALS — BP 92/58 | HR 78 | Wt 166.5 lb

## 2013-06-27 DIAGNOSIS — I513 Intracardiac thrombosis, not elsewhere classified: Secondary | ICD-10-CM

## 2013-06-27 DIAGNOSIS — I5022 Chronic systolic (congestive) heart failure: Secondary | ICD-10-CM

## 2013-06-27 DIAGNOSIS — I219 Acute myocardial infarction, unspecified: Secondary | ICD-10-CM

## 2013-06-27 DIAGNOSIS — E785 Hyperlipidemia, unspecified: Secondary | ICD-10-CM

## 2013-06-27 LAB — BASIC METABOLIC PANEL
CO2: 27 mEq/L (ref 19–32)
GFR calc Af Amer: >90 mL/min (ref >90)
GFR calc non Af Amer: >90 mL/min (ref >90)
Glucose, Bld: 155 mg/dL — ABNORMAL HIGH (ref 70–99)
Potassium: 4.2 mEq/L (ref 3.5–5.1)
Sodium: 136 mEq/L (ref 135–145)

## 2013-06-27 MED ORDER — FUROSEMIDE 40 MG PO TABS: 80.0000 mg | ORAL_TABLET | Freq: Every day | ORAL | Status: DC

## 2013-06-27 NOTE — Progress Notes (Signed)
Patient ID: Andre Tran, male   DOB: 10/23/1960, 52 y.o.   MRN: [id number]  Physicians EP : Dr Ladona Ridgel [name] Oncologist: Dr Clelia Croft  PCP: Dr. Selena Batten ([facility] [place])  HPI: 52 y.o. 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  He has h/o recurrent lymphoma (1993, 2007) treated with chemo (including adriamycin) - details unclear.  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   12/16/11 ECHO EF 15% global HK. No apical thrombus.  12/04/12 ECHO EF 15% RV mildly hypokinetic (reviewed personally)  Evaluated in [facility] ED 11/02/12 due to dyspnea and later discharged. He had been out of lasix for 1 month. He was restarted on lasix 40 mg daily. CEs negative. Pro BNP 1633.   12/12/12 CPX Peak VO2: 16.9 ml/kg/min % predicted peak VO2: 46.9% VE/VCO2 slope: 33.3 OUES: 1.36 Peak RER: 1.19 Moderate to severe functional limitation due to heart failure.  Labs 01/16/13 K 4.7 Creatinine 1.1 Labs 03/21/13 Dig level 0.3  Labs 06/20/13: K+ 3.4, Cr 0.94, pro-BNP 3235  Follow up:  Since last visit was seen the ED on 06/20/13 for SOB. They called HF clinic and we asked them to increase his lasix to 40 mg BID, however patient has been taking 60 mg daily. Feeling better. Denies SOB, PND, orthopnea or edema. Weight is 157-159 lbs. Able to do whatever he wants, wife has to run to keep up with him.   SH: Lives with wife in [place]  ROS: All systems negative except as listed in HPI, PMH and Problem List.  Past Medical History  Diagnosis Date  . Paroxysmal ventricular tachycardia     s/p AICD '05, replaced w/ St. Jude's in 2010 (PVT/V.Fib arrest requiring ICD implant in 2005)  . HYPERTENSION, UNSPECIFIED   . HYPERLIPIDEMIA-MIXED   . CVA     2008 Right basal ganglia infarct, TPA and unsuccessful attempt at clot retrieval with hemorrhagic  conversion, residual right-sided weakness  . Nonischemic cardiomyopathy     2/2 Adriamycin administration for lymphoma  . CHF (congestive heart failure)     EF 15% by echo 2009; s/p St. Jude ICD  . Diabetes mellitus     Type 2  . Cancer      non hodkins lymphoma 1992, Hodgkins 2007  . Depression   . LV (left ventricular) mural thrombus     2009, on coumadin  . Small bowel obstruction     s/p Small bowel resection 2001  . Jejunal intussusception     2008  . 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 Splenic mass     Noted on Abd [place] 2009 w/ recs for f/u CT - not done  . Hypotension     Current Outpatient Prescriptions  Medication Sig Dispense Refill  . albuterol (PROVENTIL) (2.5 MG/3ML) 0.083% nebulizer solution Take 2.5 mg by nebulization every 6 (six) hours as needed for wheezing or shortness of breath.      Marland Kitchen atorvastatin (LIPITOR) 20 MG tablet Take 20 mg by mouth daily.      . carvedilol (COREG) 6.25 MG tablet Take 6.25 mg by mouth 2 (two) times daily with a meal.      . digoxin (LANOXIN) 0.125 MG tablet Take 125 mcg by mouth every morning.       Marland Kitchen  furosemide (LASIX) 40 MG tablet Take 80 mg by mouth every morning.       . insulin glargine (LANTUS) 100 UNIT/ML injection Inject 40 Units into the skin at bedtime.       Marland Kitchen lisinopril (PRINIVIL,ZESTRIL) 5 MG tablet Take 5 mg by mouth daily.       . Rivaroxaban (XARELTO) 20 MG TABS tablet Take 20 mg by mouth daily with supper.       No current facility-administered medications for this encounter.     Filed Vitals:   06/27/13 1026  BP: 92/58  Pulse: 78  Weight: 166 lb 8 oz (75.524 kg)  SpO2: 99%   PHYSICAL EXAM: General:  Well appearing. No resp difficulty HEENT: normal Neck: supple. JVP flat Carotids 2+ bilaterally; no bruits. No lymphadenopathy or thryomegaly appreciated. Cor: PMI normal. Regular rate & rhythm. No rubs, gallops or murmurs. Lungs: CTA Abdomen: soft, nontender,  nondistended. No hepatosplenomegaly. No bruits or masses. Good bowel sounds. Extremities: no cyanosis, clubbing, rash, edema Neuro: alert & orientedx3, cranial nerves grossly intact. Moves all 4 extremities w/o difficulty. Affect pleasant.  ASSESSMENT/ PLAN:  1. Chronic systolic HF: NICM likely due to adriamycin; EF 15% (11/2012) ST Jude ICD.  - He currently has NYHA II symptoms. He was supposed to increase lasix to 40 mg BID when he was seen in the ED, however he has been taking 60 mg daily. His volume status looks good. Will cut lasix back to 40 mg BID and check BMET today.  - Reviewed visit from ED and his xray was nl.  - Will not titrate any medications d/t soft BP. Continue coreg 6.25 m BID, lisinopril 5 mg daily and digoxin 0.125 mg daily. (dig level 0.3 03/2013) - Reinforced the need and importance of daily weights, a low sodium diet, and fluid restriction (less than 2 L a day). Instructed to call the HF clinic if weight increases more than 3 lbs overnight or 5 lbs in a week.   2. H/o LV thrombus - Continue Xarelto. He denies any s/s of bleeding. 3. Hyperlipidemia: [facility], continue atorvastatin 20 mg daily.    F/U 2 months with ECHO  Ulla Potash B NP-C 06/27/2013 10:45 AM

## 2013-06-27 NOTE — Patient Instructions (Signed)
Decrease your lasix to 80 mg (2 tablets) daily.  Call with lab results.  Have a wonderful Christmas and New Year.  F/U 2 months with ECHO.  Do the following things EVERYDAY: 1) Weigh yourself in the morning before breakfast. Write it down and keep it in a log. 2) Take your medicines as prescribed 3) Eat low salt foods-Limit salt (sodium) to 2000 mg per day.  4) Stay as active as you can everyday 5) Limit all fluids for the day to less than 2 liters 6)

## 2013-07-08 ENCOUNTER — Encounter: Payer: Medicare Other | Admitting: Family Medicine

## 2013-07-08 NOTE — Progress Notes (Signed)
Error   This encounter was created in error - please disregard. 

## 2013-07-26 ENCOUNTER — Other Ambulatory Visit: Payer: Medicare Other

## 2013-07-26 ENCOUNTER — Ambulatory Visit: Payer: Medicare Other | Admitting: Oncology

## 2013-08-13 ENCOUNTER — Ambulatory Visit (HOSPITAL_COMMUNITY)
Admission: RE | Admit: 2013-08-13 | Discharge: 2013-08-13 | Disposition: A | Discharge status: Home / Self Care | Payer: Medicare Other | Source: Ambulatory Visit | Attending: Internal Medicine | Admitting: Internal Medicine

## 2013-08-13 ENCOUNTER — Encounter (HOSPITAL_COMMUNITY): Payer: Self-pay

## 2013-08-13 VITALS — BP 102/66 | HR 82 | Wt 170.4 lb

## 2013-08-13 DIAGNOSIS — I513 Intracardiac thrombosis, not elsewhere classified: Secondary | ICD-10-CM

## 2013-08-13 DIAGNOSIS — I428 Other cardiomyopathies: Secondary | ICD-10-CM | POA: Insufficient documentation

## 2013-08-13 DIAGNOSIS — I219 Acute myocardial infarction, unspecified: Secondary | ICD-10-CM

## 2013-08-13 DIAGNOSIS — R29898 Other symptoms and signs involving the musculoskeletal system: Secondary | ICD-10-CM | POA: Insufficient documentation

## 2013-08-13 DIAGNOSIS — E785 Hyperlipidemia, unspecified: Secondary | ICD-10-CM | POA: Insufficient documentation

## 2013-08-13 DIAGNOSIS — Z7901 Long term (current) use of anticoagulants: Secondary | ICD-10-CM | POA: Insufficient documentation

## 2013-08-13 DIAGNOSIS — I509 Heart failure, unspecified: Secondary | ICD-10-CM | POA: Insufficient documentation

## 2013-08-13 DIAGNOSIS — Z794 Long term (current) use of insulin: Secondary | ICD-10-CM | POA: Insufficient documentation

## 2013-08-13 DIAGNOSIS — Z95810 Presence of automatic (implantable) cardiac defibrillator: Secondary | ICD-10-CM | POA: Insufficient documentation

## 2013-08-13 DIAGNOSIS — Z9221 Personal history of antineoplastic chemotherapy: Secondary | ICD-10-CM | POA: Insufficient documentation

## 2013-08-13 DIAGNOSIS — E119 Type 2 diabetes mellitus without complications: Secondary | ICD-10-CM | POA: Insufficient documentation

## 2013-08-13 DIAGNOSIS — I10 Essential (primary) hypertension: Secondary | ICD-10-CM | POA: Insufficient documentation

## 2013-08-13 DIAGNOSIS — Z79899 Other long term (current) drug therapy: Secondary | ICD-10-CM | POA: Insufficient documentation

## 2013-08-13 DIAGNOSIS — F3289 Other specified depressive episodes: Secondary | ICD-10-CM | POA: Insufficient documentation

## 2013-08-13 DIAGNOSIS — I82891 Chronic embolism and thrombosis of other specified veins: Secondary | ICD-10-CM | POA: Insufficient documentation

## 2013-08-13 DIAGNOSIS — I69998 Other sequelae following unspecified cerebrovascular disease: Secondary | ICD-10-CM | POA: Insufficient documentation

## 2013-08-13 DIAGNOSIS — F329 Major depressive disorder, single episode, unspecified: Secondary | ICD-10-CM | POA: Insufficient documentation

## 2013-08-13 DIAGNOSIS — I5022 Chronic systolic (congestive) heart failure: Secondary | ICD-10-CM | POA: Insufficient documentation

## 2013-08-13 NOTE — Patient Instructions (Signed)
Follow up in 3 months with an ECHO and Dr Haroldine Laws  Do the following things EVERYDAY: 1) Weigh yourself in the morning before breakfast. Write it down and keep it in a log. 2) Take your medicines as prescribed 3) Eat low salt foods-Limit salt (sodium) to 2000 mg per day.  4) Stay as active as you can everyday 5) Limit all fluids for the day to less than 2 liters

## 2013-08-13 NOTE — Progress Notes (Signed)
Patient ID: BARTH TRELLA, male   DOB: 1961-02-20, 53 y.o.   MRN: [id number] Physicians EP : Dr Lovena Le [facility]: Dr Jenny Reichmann Oncologist: Dr Alen Blew  PCP: Dr. Maudie Mercury ([facility] [place])  HPI: 53 y.o. 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  He has h/o recurrent lymphoma (1993, 2007) treated with chemo (including adriamycin) - details unclear.  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   12/16/11 ECHO EF 15% global HK. No apical thrombus.  12/04/12 ECHO EF 15% RV mildly hypokinetic (reviewed personally)  Evaluated in [facility] ED 11/02/12 due to dyspnea and later discharged. He had been out of lasix for 1 month. He was restarted on lasix 40 mg daily. CEs negative. Pro BNP 1633.   12/12/12 CPX Peak VO2: 16.9 ml/kg/min % predicted peak VO2: 46.9% VE/VCO2 slope: 33.3 OUES: 1.36 Peak RER: 1.19 Moderate to severe functional limitation due to heart failure.  Labs 01/16/13 K 4.7 Creatinine 1.1 Labs 03/21/13 Dig level 0.3  Labs 06/20/13: K+ 3.4, Cr 0.94, pro-BNP 3235  He returns for follow up. Last visit lasix was cut back 40 mg twice a day. Denies SOB/PND/Orthopnea/Dizziness. No bleeding problems.  Able to walk up steps. Weight at home 155-160 pounds. Appetite good. Taking all medications. Leaves for [place] in late May. SBP at home 90s.      SH: Lives with wife in [place]. He is not a smoker or drink alcohol.   ROS: All systems negative except as listed in HPI, PMH and Problem List.  Past Medical History  Diagnosis Date  . Paroxysmal ventricular tachycardia     s/p AICD '05, replaced w/ St. Jude's in 2010 (PVT/V.Fib arrest requiring ICD implant in 2005)  . HYPERTENSION, UNSPECIFIED   . HYPERLIPIDEMIA-MIXED   . CVA     2008 Right basal ganglia infarct, TPA and unsuccessful attempt at clot retrieval with hemorrhagic conversion, residual  right-sided weakness  . Nonischemic cardiomyopathy     2/2 Adriamycin administration for lymphoma  . CHF (congestive heart failure)     EF 15% by echo 2009; s/p St. Jude ICD  . Diabetes mellitus     Type 2  . Cancer      non hodkins lymphoma 1992, Hodgkins 2007  . Depression   . LV (left ventricular) mural thrombus     2009, on coumadin  . Small bowel obstruction     s/p Small bowel resection 2001  . Jejunal intussusception     2008  . 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 Splenic mass     Noted on Abd [place] 2009 w/ recs for f/u CT - not done  . Hypotension     Current Outpatient Prescriptions  Medication Sig Dispense Refill  . albuterol (PROVENTIL) (2.5 MG/3ML) 0.083% nebulizer solution Take 2.5 mg by nebulization every 6 (six) hours as needed for wheezing or shortness of breath.      Marland Kitchen atorvastatin (LIPITOR) 20 MG tablet Take 20 mg by mouth daily.      . carvedilol (COREG) 6.25 MG tablet Take 6.25 mg by mouth 2 (two) times daily with a meal.      . digoxin (LANOXIN) 0.125 MG tablet Take 125 mcg by mouth every morning.       . furosemide (LASIX) 40 MG tablet Take  2 tablets (80 mg total) by mouth daily.  60 tablet  3  . insulin glargine (LANTUS) 100 UNIT/ML injection Inject 40 Units into the skin at bedtime.       Marland Kitchen lisinopril (PRINIVIL,ZESTRIL) 5 MG tablet Take 5 mg by mouth daily.       . Rivaroxaban (XARELTO) 20 MG TABS tablet Take 20 mg by mouth daily with supper.       No current facility-administered medications for this encounter.     Filed Vitals:   08/13/13 1342  BP: 102/66  Pulse: 82  Weight: 170 lb 6.4 oz (77.293 kg)  SpO2: 100%   PHYSICAL EXAM: General:  Well appearing. No resp difficulty Wife present HEENT: normal Neck: supple. JVP flat Carotids 2+ bilaterally; no bruits. No lymphadenopathy or thryomegaly appreciated. Cor: PMI normal. Regular rate & rhythm. No rubs, gallops or murmurs. Lungs: CTA Abdomen: soft,  nontender, nondistended. No hepatosplenomegaly. No bruits or masses. Good bowel sounds. Extremities: no cyanosis, clubbing, rash, edema Neuro: alert & orientedx3, cranial nerves grossly intact. Moves all 4 extremities w/o difficulty. Affect pleasant.  ASSESSMENT/ PLAN: 1. Chronic systolic HF: NICM likely due to adriamycin; EF 15% (11/2012) ST Jude ICD.  - NYHA II.  Volume status stable. Continue lasix  40 mg BID. Continue coreg 6.25 mg BID, lisinopril 5 mg daily and digoxin 0.125 mg daily. (dig level 0.3 03/2013). BP soft at home will not titrate HF meds.  Check ECHO next visit.  - Reinforced the need and importance of daily weights, a low sodium diet, and fluid restriction (less than 2 L a day). Instructed to call the HF clinic if weight increases more than 3 lbs overnight or 5 lbs in a week.   2. H/o LV thrombus - Continue Xarelto. He denies any s/s of bleeding.   Follow up in 3 month with an ECHO and Dr Haroldine Laws  CLEGG,AMY NP-C 08/13/2013 1:59 PM

## 2013-10-24 ENCOUNTER — Encounter: Payer: Self-pay | Admitting: Internal Medicine

## 2013-10-24 ENCOUNTER — Telehealth: Payer: Self-pay | Admitting: Internal Medicine

## 2013-10-24 NOTE — Telephone Encounter (Signed)
10-24-13 N/A, MAILBOX FULL, SENT PAST DUE LETTER, PT WAS DUE TO SEE TAYLOR IN JANUARY 2014 FOR DEFIB CHECK/MT

## 2013-10-25 ENCOUNTER — Encounter: Payer: Self-pay | Admitting: Internal Medicine

## 2013-11-12 ENCOUNTER — Encounter (HOSPITAL_COMMUNITY): Payer: Self-pay

## 2013-11-21 ENCOUNTER — Encounter (INDEPENDENT_AMBULATORY_CARE_PROVIDER_SITE_OTHER): Payer: Self-pay

## 2013-11-21 ENCOUNTER — Encounter: Payer: Self-pay | Admitting: Internal Medicine

## 2013-11-21 ENCOUNTER — Ambulatory Visit (INDEPENDENT_AMBULATORY_CARE_PROVIDER_SITE_OTHER): Payer: Medicare Other | Admitting: Internal Medicine

## 2013-11-21 VITALS — BP 118/76 | HR 78 | Ht 67.0 in | Wt 174.0 lb

## 2013-11-21 DIAGNOSIS — I472 Ventricular tachycardia, unspecified: Secondary | ICD-10-CM

## 2013-11-21 DIAGNOSIS — I4729 Other ventricular tachycardia: Secondary | ICD-10-CM

## 2013-11-21 DIAGNOSIS — I5022 Chronic systolic (congestive) heart failure: Secondary | ICD-10-CM

## 2013-11-21 DIAGNOSIS — Z95810 Presence of automatic (implantable) cardiac defibrillator: Secondary | ICD-10-CM

## 2013-11-21 DIAGNOSIS — I429 Cardiomyopathy, unspecified: Secondary | ICD-10-CM

## 2013-11-21 NOTE — Assessment & Plan Note (Signed)
He has had no sustained arrhythmias and will continue his current meds.

## 2013-11-21 NOTE — Assessment & Plan Note (Signed)
His St. Jude device is working normally. Will recheck in several months.  

## 2013-11-21 NOTE — Assessment & Plan Note (Signed)
His heart failure appears to be class 2. He will continue his current meds. I have encouraged the patient to increase his physical activity.

## 2013-11-21 NOTE — Patient Instructions (Signed)
Your physician recommends that you schedule a follow-up appointment in: 3 months with the device clinic and in 12 months with Dr.Taylor

## 2013-11-21 NOTE — Progress Notes (Signed)
HPI Andre Tran returns today for followup. He is a very pleasant 53 year old man with a nonischemic cardiomyopathy, chronic systolic heart failure, status post ICD implantation. He also is a history of ventricular tachycardia and a mural thrombus for which she is on chronic anticoagulation. The patient denies chest pain. He has class II heart failure symptoms. He does admit to some dietary indiscretion with sodium. He has been in the hospital once since his last visit. No Known Allergies   Current Outpatient Prescriptions  Medication Sig Dispense Refill  . albuterol (PROVENTIL) (2.5 MG/3ML) 0.083% nebulizer solution Take 2.5 mg by nebulization every 6 (six) hours as needed for wheezing or shortness of breath.      Marland Kitchen atorvastatin (LIPITOR) 20 MG tablet Take 20 mg by mouth daily.      . carvedilol (COREG) 6.25 MG tablet Take 6.25 mg by mouth 2 (two) times daily with a meal.      . digoxin (LANOXIN) 0.125 MG tablet Take 125 mcg by mouth every morning.       . furosemide (LASIX) 40 MG tablet Take 2 tablets (80 mg total) by mouth daily.  60 tablet  3  . insulin glargine (LANTUS) 100 UNIT/ML injection Inject 40 Units into the skin at bedtime.       Marland Kitchen lisinopril (PRINIVIL,ZESTRIL) 5 MG tablet Take 5 mg by mouth daily.       . Rivaroxaban (XARELTO) 20 MG TABS tablet Take 20 mg by mouth daily with supper.       No current facility-administered medications for this visit.     Past Medical History  Diagnosis Date  . Paroxysmal ventricular tachycardia     s/p AICD '05, replaced w/ St. Jude's in 2010 (PVT/V.Fib arrest requiring ICD implant in 2005)  . HYPERTENSION, UNSPECIFIED   . HYPERLIPIDEMIA-MIXED   . 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 Nonischemic cardiomyopathy     2/2 Adriamycin administration for lymphoma  . CHF (congestive heart failure)     EF 15% by echo 2009; s/p St. Jude ICD   . Diabetes mellitus     Type 2  . Cancer      non hodkins lymphoma 1992, Hodgkins 2007  . Depression   . LV (left ventricular) mural thrombus     2009, on coumadin  . Small bowel obstruction     s/p Small bowel resection 2001  . Jejunal intussusception     2008  . 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 Splenic mass     Noted on Abd [place] 2009 w/ recs for f/u CT - not done  . Hypotension     ROS:   All systems reviewed and negative except as noted in the HPI.   Past Surgical History  Procedure Laterality Date  . Cardiac defibrillator placement      2005, replaced w/ St. Jude's 2010  . Vasectomy    . Bowel resection      small bowell 2/2 obstruction 2001  . Pacemaker insertion    . Insert / replace / remove pacemaker       Family History  Problem Relation Age of Onset  . Other      No known family h/o heart disease  . Heart disease    . Diabetes Mother   . Other Other     complications  from hip replacement     History   Social History  . Marital Status: Married    Spouse Name: N/A    Number of Children: N/A  . Years of Education: N/A   Occupational History  . Not on file.   Social History Main Topics  . Smoking status: Never Smoker   . Smokeless tobacco: Never Used  . Alcohol Use: No     Comment: ON SPECIAL OCCASIONS  . Drug Use: No  . Sexual Activity: No   Other Topics Concern  . Not on file   Social History Narrative  . No narrative on file     BP 118/76  Pulse 78  Ht 5\' 7"  (1.702 m)  Wt 174 lb (78.926 kg)  BMI 27.25 kg/m2  Physical Exam:  Well appearing middle-aged man, NAD HEENT: Unremarkable Neck:  No JVD, no thyromegally Lungs:  Clear with no wheezes, rales, or rhonchi. Mild gynecomastia HEART:  Regular rate rhythm, no murmurs, no rubs, no clicks Abd:  soft, positive bowel sounds, no organomegally, no rebound, no guarding Ext:  2 plus pulses, no edema, no cyanosis, no clubbing Skin:  No  rashes no nodules Neuro:  CN II through XII intact, motor grossly intact  DEVICE  Normal device function.  See PaceArt for details.   Assess/Plan:

## 2013-11-26 ENCOUNTER — Encounter: Payer: Self-pay | Admitting: Internal Medicine

## 2013-11-26 LAB — MDC_IDC_ENUM_SESS_TYPE_INCLINIC
Battery Remaining Longevity: 63.6 mo
Brady Statistic RA Percent Paced: 0 %
Brady Statistic RV Percent Paced: 0 %
HighPow Impedance: 47.4775
Implantable Pulse Generator Serial Number: 593606
Lead Channel Impedance Value: 450 Ohm
Lead Channel Pacing Threshold Amplitude: 0.75 V
Lead Channel Pacing Threshold Pulse Width: 0.4 ms
Lead Channel Pacing Threshold Pulse Width: 0.4 ms
Lead Channel Sensing Intrinsic Amplitude: 4.7 mV
Lead Channel Setting Pacing Amplitude: 2 V
Lead Channel Setting Pacing Amplitude: 2.5 V
Lead Channel Setting Pacing Pulse Width: 0.4 ms
MDC IDC MSMT LEADCHNL RA PACING THRESHOLD AMPLITUDE: 0.75 V
MDC IDC MSMT LEADCHNL RA PACING THRESHOLD PULSEWIDTH: 0.4 ms
MDC IDC MSMT LEADCHNL RV IMPEDANCE VALUE: 350 Ohm
MDC IDC MSMT LEADCHNL RV PACING THRESHOLD AMPLITUDE: 0.75 V
MDC IDC MSMT LEADCHNL RV PACING THRESHOLD AMPLITUDE: 0.75 V
MDC IDC MSMT LEADCHNL RV PACING THRESHOLD PULSEWIDTH: 0.4 ms
MDC IDC MSMT LEADCHNL RV SENSING INTR AMPL: 11.8 mV
MDC IDC SESS DTM: 20150514130459
MDC IDC SET LEADCHNL RV SENSING SENSITIVITY: 0.5 mV
MDC IDC SET ZONE DETECTION INTERVAL: 280 ms
MDC IDC SET ZONE DETECTION INTERVAL: 310 ms

## 2013-12-09 ENCOUNTER — Ambulatory Visit (HOSPITAL_COMMUNITY)
Admission: RE | Admit: 2013-12-09 | Discharge: 2013-12-09 | Disposition: A | Discharge status: Home / Self Care | Payer: Medicare Other | Source: Ambulatory Visit | Attending: Internal Medicine | Admitting: Internal Medicine

## 2013-12-09 ENCOUNTER — Encounter (HOSPITAL_COMMUNITY): Payer: Self-pay

## 2013-12-09 ENCOUNTER — Ambulatory Visit (HOSPITAL_BASED_OUTPATIENT_CLINIC_OR_DEPARTMENT_OTHER)
Admission: RE | Admit: 2013-12-09 | Discharge: 2013-12-09 | Disposition: A | Discharge status: Home / Self Care | Payer: Medicare Other | Source: Ambulatory Visit | Attending: Internal Medicine | Admitting: Internal Medicine

## 2013-12-09 VITALS — BP 104/62 | HR 78 | Wt 173.0 lb

## 2013-12-09 DIAGNOSIS — I5022 Chronic systolic (congestive) heart failure: Secondary | ICD-10-CM | POA: Insufficient documentation

## 2013-12-09 DIAGNOSIS — I379 Nonrheumatic pulmonary valve disorder, unspecified: Secondary | ICD-10-CM

## 2013-12-09 NOTE — Patient Instructions (Signed)
Follow up in 3 months   Do the following things EVERYDAY: 1) Weigh yourself in the morning before breakfast. Write it down and keep it in a log. 2) Take your medicines as prescribed 3) Eat low salt foods-Limit salt (sodium) to 2000 mg per day.  4) Stay as active as you can everyday 5) Limit all fluids for the day to less than 2 liters  

## 2013-12-09 NOTE — Progress Notes (Signed)
Patient ID: Andre Tran, male   DOB: [birth year], 53 y.o.   MRN: [id number] Physicians EP : Dr Lovena Le [facility]: Dr Jenny Reichmann Oncologist: Dr Alen Blew  PCP: Dr. Maudie Mercury ([facility] [place])  HPI: 53 y.o. 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  He has h/o recurrent lymphoma (1993, 2007) treated with chemo (including adriamycin) - details unclear.  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   He returns for follow up with his wife. Denies SOB/PND/Dizziness. + Orthopnea. Sleeping on 3 pillows. No bleeding problems.  Able to walk up steps. Weight at home 159-160  pounds. Appetite good. Taking all medications.    12/16/11 ECHO EF 15% global HK. No apical thrombus.  12/04/12 ECHO EF 15% RV mildly hypokinetic (reviewed personally) 12/09/13 ECHO EF 10-15% Mild/Mod RV dysfunction  12/12/12 CPX Peak VO2: 16.9 ml/kg/min % predicted peak VO2: 46.9% VE/VCO2 slope: 33.3 OUES: 1.36 Peak RER: 1.19 Moderate to severe functional limitation due to heart failure.  Labs 01/16/13 K 4.7 Creatinine 1.1 Labs 03/21/13 Dig level 0.3  Labs 06/20/13: K+ 3.4, Cr 0.94, pro-BNP 3235   SH: Lives with wife in [place]. He is not a smoker or drink alcohol.   ROS: All systems negative except as listed in HPI, PMH and Problem List.  Past Medical History  Diagnosis Date  . Paroxysmal ventricular tachycardia     s/p AICD '05, replaced w/ St. Jude's in 2010 (PVT/V.Fib arrest requiring ICD implant in 2005)  . HYPERTENSION, UNSPECIFIED   . HYPERLIPIDEMIA-MIXED   . 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 Nonischemic cardiomyopathy     2/2 Adriamycin administration for lymphoma  . CHF (congestive heart failure)     EF 15% by echo 2009; s/p St. Jude ICD  .  Diabetes mellitus     Type 2  . Cancer      non hodkins lymphoma 1992, Hodgkins 2007  . Depression   . LV (left ventricular) mural thrombus     2009, on coumadin  . Small bowel obstruction     s/p Small bowel resection 2001  . Jejunal intussusception     2008  . 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 Splenic mass     Noted on Abd [place] 2009 w/ recs for f/u CT - not done  . Hypotension     Current Outpatient Prescriptions  Medication Sig Dispense Refill  . albuterol (PROVENTIL) (2.5 MG/3ML) 0.083% nebulizer solution Take 2.5 mg by nebulization every 6 (six) hours as needed for wheezing or shortness of breath.      Marland Kitchen atorvastatin (LIPITOR) 20 MG tablet Take 20 mg by mouth daily.      . carvedilol (COREG) 6.25 MG tablet Take 6.25 mg by mouth 2 (two) times daily with a meal.      . digoxin (LANOXIN) 0.125 MG tablet Take 125 mcg by mouth every morning.       . furosemide (LASIX) 40 MG tablet Take 2 tablets (80 mg total) by mouth daily.  60 tablet  3  . insulin glargine (LANTUS) 100 UNIT/ML injection Inject 40 Units into the skin at bedtime.       Marland Kitchen lisinopril (PRINIVIL,ZESTRIL) 5 MG  tablet Take 5 mg by mouth daily.       . Rivaroxaban (XARELTO) 20 MG TABS tablet Take 20 mg by mouth daily with supper.       No current facility-administered medications for this encounter.     Filed Vitals:   12/09/13 1237  BP: 104/62  Pulse: 78  Weight: 173 lb (78.472 kg)  SpO2: 100%   PHYSICAL EXAM: General:  Well appearing. No resp difficulty Wife present HEENT: normal Neck: supple. JVP flat Carotids 2+ bilaterally; no bruits. No lymphadenopathy or thryomegaly appreciated. Cor: PMI normal. Regular rate & rhythm. No rubs, gallops or murmurs. No S3 Lungs: CTA Abdomen: soft, nontender, nondistended. No hepatosplenomegaly. No bruits or masses. Good bowel sounds. Extremities: no cyanosis, clubbing, rash, edema Neuro: alert & orientedx3, cranial nerves grossly  intact. Moves all 4 extremities w/o difficulty. Affect pleasant.  ASSESSMENT/ PLAN: 1. Chronic systolic HF: NICM likely due to adriamycin; EF 15% (12/09/2013) ST Jude ICD.  - NYHA II.  Volume status stable. Continue lasix  40 mg BID.  -Continue coreg 6.25 mg BID, lisinopril 5 mg daily and digoxin 0.125 mg daily. (dig level 0.3  03/2013). BP soft at home will not titrate HF meds.  -Dr Haroldine Laws reviewed and discussed ECHO. EF 10-15%.  - Will need to repeat CPX soon. Consider at next visit.    - Reinforced the need and importance of daily weights, a low sodium diet, and fluid restriction (less than 2 L a day). Instructed to call the HF clinic if weight increases more than 3 lbs overnight or 5 lbs in a week or he developes increased dyspnea.   2. H/o LV thrombus - Continue Xarelto. He denies any s/s of bleeding.  Follow up in 3 months or sooner if he develops dyspnea.   Conrad Bement NP-C 12/09/2013 12:44 PM  Patient seen and examined with Darrick Grinder, NP. We discussed all aspects of the encounter. I agree with the assessment and plan as stated above.  Echo reviewed personally. EF 15%. However clinically doing well. NYHA II. Volume status looks good. BP too soft to titrate meds. Discussed likely need for advanced therapies in future. Will repeat CPX in next few months to get objective assessment of where he is at.  Shaune Pascal Bensimhon,MD 9:43 PM

## 2013-12-09 NOTE — Progress Notes (Signed)
  Echocardiogram 2D Echocardiogram has been performed.  Basilia Jumbo 12/09/2013, 11:49 AM

## 2014-03-13 ENCOUNTER — Encounter: Payer: Self-pay | Admitting: Internal Medicine

## 2014-03-28 ENCOUNTER — Encounter (HOSPITAL_COMMUNITY): Payer: Self-pay | Admitting: Vascular Surgery

## 2014-04-22 ENCOUNTER — Encounter: Payer: Self-pay | Admitting: Internal Medicine

## 2014-04-22 ENCOUNTER — Other Ambulatory Visit: Payer: Self-pay | Admitting: Internal Medicine

## 2014-04-22 ENCOUNTER — Encounter: Payer: Medicare Other | Admitting: *Deleted

## 2014-04-22 DIAGNOSIS — I4729 Other ventricular tachycardia: Secondary | ICD-10-CM

## 2014-04-22 DIAGNOSIS — I472 Ventricular tachycardia: Secondary | ICD-10-CM

## 2014-04-22 DIAGNOSIS — I5022 Chronic systolic (congestive) heart failure: Secondary | ICD-10-CM

## 2014-04-22 DIAGNOSIS — Z95810 Presence of automatic (implantable) cardiac defibrillator: Secondary | ICD-10-CM

## 2014-04-22 DIAGNOSIS — I429 Cardiomyopathy, unspecified: Secondary | ICD-10-CM

## 2014-04-25 LAB — MDC_IDC_ENUM_SESS_TYPE_INCLINIC
Battery Remaining Longevity: 60 mo
Brady Statistic RA Percent Paced: 0 %
HIGH POWER IMPEDANCE MEASURED VALUE: 47 Ohm
Lead Channel Impedance Value: 337.5 Ohm
Lead Channel Pacing Threshold Amplitude: 0.75 V
Lead Channel Pacing Threshold Amplitude: 0.75 V
Lead Channel Pacing Threshold Pulse Width: 0.4 ms
Lead Channel Pacing Threshold Pulse Width: 0.4 ms
Lead Channel Setting Pacing Amplitude: 2 V
Lead Channel Setting Pacing Amplitude: 2.5 V
Lead Channel Setting Pacing Pulse Width: 0.4 ms
Lead Channel Setting Sensing Sensitivity: 0.5 mV
MDC IDC MSMT LEADCHNL RA IMPEDANCE VALUE: 412.5 Ohm
MDC IDC MSMT LEADCHNL RA PACING THRESHOLD AMPLITUDE: 0.75 V
MDC IDC MSMT LEADCHNL RA SENSING INTR AMPL: 4.7 mV
MDC IDC MSMT LEADCHNL RV PACING THRESHOLD AMPLITUDE: 0.75 V
MDC IDC MSMT LEADCHNL RV PACING THRESHOLD PULSEWIDTH: 0.4 ms
MDC IDC MSMT LEADCHNL RV PACING THRESHOLD PULSEWIDTH: 0.4 ms
MDC IDC MSMT LEADCHNL RV SENSING INTR AMPL: 11.6 mV
MDC IDC PG SERIAL: 593606
MDC IDC SESS DTM: 20151013144340
MDC IDC SET ZONE DETECTION INTERVAL: 280 ms
MDC IDC STAT BRADY RV PERCENT PACED: 0.04 %
Zone Setting Detection Interval: 310 ms

## 2014-06-12 ENCOUNTER — Emergency Department (HOSPITAL_COMMUNITY): Payer: Medicare Other

## 2014-06-12 ENCOUNTER — Emergency Department (HOSPITAL_COMMUNITY)
Admission: EM | Admit: 2014-06-12 | Discharge: 2014-06-12 | Disposition: A | Discharge status: Home / Self Care | Payer: Medicare Other | Attending: Emergency Medicine | Admitting: Emergency Medicine

## 2014-06-12 ENCOUNTER — Encounter (HOSPITAL_COMMUNITY): Payer: Self-pay | Admitting: *Deleted

## 2014-06-12 DIAGNOSIS — Z8719 Personal history of other diseases of the digestive system: Secondary | ICD-10-CM | POA: Insufficient documentation

## 2014-06-12 DIAGNOSIS — Z8572 Personal history of non-Hodgkin lymphomas: Secondary | ICD-10-CM | POA: Diagnosis not present

## 2014-06-12 DIAGNOSIS — Z7901 Long term (current) use of anticoagulants: Secondary | ICD-10-CM | POA: Diagnosis not present

## 2014-06-12 DIAGNOSIS — I509 Heart failure, unspecified: Secondary | ICD-10-CM | POA: Insufficient documentation

## 2014-06-12 DIAGNOSIS — R0602 Shortness of breath: Secondary | ICD-10-CM

## 2014-06-12 DIAGNOSIS — Z8673 Personal history of transient ischemic attack (TIA), and cerebral infarction without residual deficits: Secondary | ICD-10-CM | POA: Insufficient documentation

## 2014-06-12 DIAGNOSIS — I10 Essential (primary) hypertension: Secondary | ICD-10-CM | POA: Insufficient documentation

## 2014-06-12 DIAGNOSIS — Z794 Long term (current) use of insulin: Secondary | ICD-10-CM | POA: Diagnosis not present

## 2014-06-12 DIAGNOSIS — J069 Acute upper respiratory infection, unspecified: Secondary | ICD-10-CM | POA: Insufficient documentation

## 2014-06-12 DIAGNOSIS — Z95810 Presence of automatic (implantable) cardiac defibrillator: Secondary | ICD-10-CM | POA: Insufficient documentation

## 2014-06-12 DIAGNOSIS — E119 Type 2 diabetes mellitus without complications: Secondary | ICD-10-CM | POA: Insufficient documentation

## 2014-06-12 DIAGNOSIS — Z86718 Personal history of other venous thrombosis and embolism: Secondary | ICD-10-CM | POA: Insufficient documentation

## 2014-06-12 DIAGNOSIS — Z9114 Patient's other noncompliance with medication regimen: Secondary | ICD-10-CM | POA: Diagnosis not present

## 2014-06-12 DIAGNOSIS — Z79899 Other long term (current) drug therapy: Secondary | ICD-10-CM | POA: Diagnosis not present

## 2014-06-12 DIAGNOSIS — Z8659 Personal history of other mental and behavioral disorders: Secondary | ICD-10-CM | POA: Diagnosis not present

## 2014-06-12 DIAGNOSIS — E782 Mixed hyperlipidemia: Secondary | ICD-10-CM | POA: Diagnosis not present

## 2014-06-12 HISTORY — DX: Patient's other noncompliance with medication regimen for other reason: Z91.148

## 2014-06-12 HISTORY — DX: Patient's other noncompliance with medication regimen: Z91.14

## 2014-06-12 LAB — CBC
HCT: 41.6 % (ref 39.0–52.0)
Hemoglobin: 14.1 g/dL (ref 13.0–17.0)
MCH: 31.8 pg (ref 26.0–34.0)
MCHC: 33.9 g/dL (ref 30.0–36.0)
MCV: 93.9 fL (ref 78.0–100.0)
PLATELETS: 232 10*3/uL (ref 150–400)
RBC: 4.43 MIL/uL (ref 4.22–5.81)
RDW: 15.1 % (ref 11.5–15.5)
WBC: 6.7 10*3/uL (ref 4.0–10.5)

## 2014-06-12 LAB — BASIC METABOLIC PANEL
Anion gap: 14 (ref 5–15)
BUN: 21 mg/dL (ref 6–23)
CALCIUM: 9.3 mg/dL (ref 8.4–10.5)
CO2: 22 mEq/L (ref 19–32)
Chloride: 95 mEq/L — ABNORMAL LOW (ref 96–112)
Creatinine, Ser: 1.04 mg/dL (ref 0.50–1.35)
GFR calc Af Amer: >90 mL/min (ref >90)
GFR calc non Af Amer: 81 mL/min — ABNORMAL LOW (ref >90)
Glucose, Bld: 241 mg/dL — ABNORMAL HIGH (ref 70–99)
Potassium: 5.3 mEq/L (ref 3.7–5.3)
Sodium: 131 mEq/L — ABNORMAL LOW (ref 137–147)

## 2014-06-12 LAB — PRO B NATRIURETIC PEPTIDE: Pro B Natriuretic peptide (BNP): 2079 pg/mL — ABNORMAL HIGH (ref 0–125)

## 2014-06-12 LAB — I-STAT TROPONIN, ED: Troponin i, poc: 0.02 ng/mL (ref 0.00–0.08)

## 2014-06-12 LAB — DIGOXIN LEVEL: Digoxin Level: <0.3 ng/mL — ABNORMAL LOW (ref 0.8–2.0)

## 2014-06-12 MED ORDER — BENZONATATE 100 MG PO CAPS: 100.0000 mg | ORAL_CAPSULE | Freq: Three times a day (TID) | ORAL | Status: DC | PRN

## 2014-06-12 NOTE — ED Notes (Signed)
MD McManus at bedside. 

## 2014-06-12 NOTE — ED Provider Notes (Signed)
CSN: [id number]     Arrival date & time 06/12/14  1249 History   First MD Initiated Contact with Patient 06/12/14 1327     Chief Complaint  Patient presents with  . Shortness of Breath  . Facial Pain  . Generalized Body Aches      HPI Pt was seen at 1340.  Per pt, c/o gradual onset and persistence of constant runny/stuffy nose, sinus congestion, ears congestion, and cough for the past 4 days. Has been associated with chills, generalized body aches and intermittent "SOB." Denies objective fevers, no rash, no CP/palpitations, no N/V/D, no abd pain.     Past Medical History  Diagnosis Date  . Paroxysmal ventricular tachycardia     s/p AICD '05, replaced w/ St. Jude's in 2010 (PVT/V.Fib arrest requiring ICD implant in 2005)  . HYPERTENSION, UNSPECIFIED   . HYPERLIPIDEMIA-MIXED   . 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 Nonischemic cardiomyopathy     2/2 Adriamycin administration for lymphoma  . CHF (congestive heart failure)     EF 15% by echo 2009; s/p St. Jude ICD  . Diabetes mellitus     Type 2  . Cancer      non hodkins lymphoma 1992, Hodgkins 2007  . Depression   . LV (left ventricular) mural thrombus     2009, on coumadin  . Small bowel obstruction     s/p Small bowel resection 2001  . Jejunal intussusception     2008  . 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 Splenic mass     Noted on Abd [place] 2009 w/ recs for f/u CT - not done  . Hypotension   . Noncompliance with medication regimen    Past Surgical History  Procedure Laterality Date  . Cardiac defibrillator placement      2005, replaced w/ St. Jude's 2010  . Vasectomy    . Bowel resection      small bowell 2/2 obstruction 2001  . Pacemaker insertion    . Insert / replace / remove pacemaker     Family History  Problem Relation Age of Onset  . Other      No known  family h/o heart disease  . Heart disease    . Diabetes Mother   . Other Other     complications from hip replacement   History  Substance Use Topics  . Smoking status: Never Smoker   . Smokeless tobacco: Never Used  . Alcohol Use: No     Comment: ON SPECIAL OCCASIONS    Review of Systems ROS: Statement: All systems negative except as marked or noted in the HPI; Constitutional: Negative for objective fever and +chills, generalized body aches.; ; Eyes: Negative for eye pain, redness and discharge. ; ; ENMT: Negative for hoarseness, sore throat. +nasal congestion, sinus pressure and ears congestion. ; ; Cardiovascular: Negative for chest pain, palpitations, diaphoresis, and peripheral edema. ; ; Respiratory: +cough, intermittent SOB. Negative for wheezing and stridor. ; ; Gastrointestinal: Negative for nausea, vomiting, diarrhea, abdominal pain, blood in stool, hematemesis, jaundice and rectal bleeding. . ; ; Genitourinary: Negative for dysuria, flank pain and hematuria. ; ; Musculoskeletal: Negative for back pain and neck pain. Negative for swelling and trauma.; ; Skin: Negative for pruritus, rash, abrasions, blisters, bruising and skin lesion.; ; Neuro: Negative for headache, lightheadedness and  neck stiffness. Negative for weakness, altered level of consciousness , altered mental status, extremity weakness, paresthesias, involuntary movement, seizure and syncope.     Allergies  Review of patient's allergies indicates no known allergies.  Home Medications   Prior to Admission medications   Medication Sig Start Date End Date Taking? Authorizing Provider  albuterol (PROVENTIL) (2.5 MG/3ML) 0.083% nebulizer solution Take 2.5 mg by nebulization every 6 (six) hours as needed for wheezing or shortness of breath.    Historical Provider, MD  atorvastatin (LIPITOR) 20 MG tablet Take 20 mg by mouth daily.    Historical Provider, MD  carvedilol (COREG) 6.25 MG tablet Take 6.25 mg by mouth 2 (two)  times daily with a meal. 02/05/13   Jolaine Artist, MD  digoxin (LANOXIN) 0.125 MG tablet Take 125 mcg by mouth every morning.  12/13/11   Jessica A Hope, PA-C  furosemide (LASIX) 40 MG tablet Take 2 tablets (80 mg total) by mouth daily. 06/27/13   Rande Brunt, NP  insulin glargine (LANTUS) 100 UNIT/ML injection Inject 40 Units into the skin at bedtime.     Historical Provider, MD  lisinopril (PRINIVIL,ZESTRIL) 5 MG tablet Take 5 mg by mouth daily.     Historical Provider, MD  Rivaroxaban (XARELTO) 20 MG TABS tablet Take 20 mg by mouth daily with supper.    Historical Provider, MD   BP 132/87 mmHg  Pulse 97  Temp(Src) 98.1 F (36.7 C) (Oral)  Resp 17  Ht 5\' 7"  (1.702 m)  Wt 160 lb (72.576 kg)  BMI 25.05 kg/m2  SpO2 97% Physical Exam  1345: Physical examination:  Nursing notes reviewed; Vital signs and O2 SAT reviewed;  Constitutional: Well developed, Well nourished, Well hydrated, In no acute distress; Head:  Normocephalic, atraumatic; Eyes: EOMI, PERRL, No scleral icterus; ENMT: TM's clear bilat. +edemetous nasal turbinates bilat with clear rhinorrhea. Mouth and pharynx without lesions. No tonsillar exudates. No intra-oral edema. No submandibular or sublingual edema. No hoarse voice, no drooling, no stridor. No pain with manipulation of larynx. No trismus. Mouth and pharynx normal, Mucous membranes moist; Neck: Supple, Full range of motion, No lymphadenopathy; Cardiovascular: Regular rate and rhythm, No gallop; Respiratory: Breath sounds clear & equal bilaterally, No wheezes. Speaking full sentences with ease, Normal respiratory effort/excursion; Chest: Nontender, Movement normal; Abdomen: Soft, Nontender, Nondistended, Normal bowel sounds; Genitourinary: No CVA tenderness; Extremities: Pulses normal, No tenderness, No edema, No calf edema or asymmetry.; Neuro: AA&Ox3, Major CN grossly intact.  Speech clear. No gross focal motor or sensory deficits in extremities.; Skin: Color normal, Warm,  Dry.   ED Course  Procedures     EKG Interpretation   Date/Time:  Thursday June 12 2014 12:56:31 EST Ventricular Rate:  99 PR Interval:  196 QRS Duration: 94 QT Interval:  374 QTC Calculation: 479 R Axis:   -52 Text Interpretation:  Normal sinus rhythm Left axis deviation Left  anterior fasicular block Consider Anterior infarct , age undetermined T  wave abnormality Lateral leads Abnormal ECG When compared with ECG of  06/20/2013 No significant change was found Confirmed by [facility]  MD,  Nunzio Cory [phone]) on 06/12/2014 2:15:52 PM      MDM  MDM Reviewed: previous chart, nursing note and vitals Reviewed previous: labs, ECG and x-ray Interpretation: labs, ECG and x-ray     Results for orders placed or performed during the hospital encounter of 06/12/14  CBC  Result Value Ref Range   WBC 6.7 4.0 - 10.5 K/uL   RBC 4.43  4.22 - 5.81 MIL/uL   Hemoglobin 14.1 13.0 - 17.0 g/dL   HCT 41.6 39.0 - 52.0 %   MCV 93.9 78.0 - 100.0 fL   MCH 31.8 26.0 - 34.0 pg   MCHC 33.9 30.0 - 36.0 g/dL   RDW 15.1 11.5 - 15.5 %   Platelets 232 150 - 400 K/uL  BNP (order ONLY if patient complains of dyspnea/SOB AND you have documented it for THIS visit)  Result Value Ref Range   Pro B Natriuretic peptide (BNP) 2079.0 (H) 0 - 125 pg/mL  Basic metabolic panel  Result Value Ref Range   Sodium 131 (L) 137 - 147 mEq/L   Potassium 5.3 3.7 - 5.3 mEq/L   Chloride 95 (L) 96 - 112 mEq/L   CO2 22 19 - 32 mEq/L   Glucose, Bld 241 (H) 70 - 99 mg/dL   BUN 21 6 - 23 mg/dL   Creatinine, Ser 1.04 0.50 - 1.35 mg/dL   Calcium 9.3 8.4 - 10.5 mg/dL   GFR calc non Af Amer 81 (L) >90 mL/min   GFR calc Af Amer >90 >90 mL/min   Anion gap 14 5 - 15  Digoxin level  Result Value Ref Range   Digoxin Level <0.3 (L) 0.8 - 2.0 ng/mL  I-stat troponin, ED (not at [facility])  Result Value Ref Range   Troponin i, poc 0.02 0.00 - 0.08 ng/mL   Comment 3           Dg Chest 2 View 06/12/2014   CLINICAL DATA:  Chest pain  for 1 week  EXAM: CHEST  2 VIEW  COMPARISON:  06/20/2013  FINDINGS: Cardiomegaly is noted. Dual lead cardiac pacemaker is unchanged in position. No acute infiltrate or pulmonary edema. Bony thorax is unremarkable.  IMPRESSION: Cardiomegaly. Dual lead cardiac pacemaker in place. No active disease.   Electronically Signed   By: Lahoma Crocker M.D.   On: 06/12/2014 14:39    Results for POLO, MCMARTIN (MRN [id number]) as of 06/12/2014 15:42  Ref. Range 11/02/2012 09:23 06/20/2013 07:50 06/12/2014 14:15  Pro B Natriuretic peptide (BNP) Latest Range: 0-125 pg/mL 1633.0 (H) 3235.0 (H) 2079.0 (H)    1540:  Pt ambulated with Sats remaining 98% R/A, resps easy, gait steady, NAD. Pt states he "feels ok" and wants to go home now. T/C to Heart Failure Team Dr. Haroldine Laws, case discussed, including:  HPI, pertinent PM/SHx, VS/PE, dx testing, ED course and treatment:  Agrees no signs of acute HF at this time, no meds adjustment for now, f/u in HF Clinic next week. Pt has gotten himself dressed and wants to go home now. Will tx symptomatically at this time. Dx and testing d/w pt.  Questions answered.  Verb understanding, agreeable to d/c home with outpt f/u.     Francine Graven, DO 06/15/14 1209

## 2014-06-12 NOTE — Discharge Instructions (Signed)
°Emergency Department Resource Guide °1) Find a Doctor and Pay Out of Pocket °Although you won't have to find out who is covered by your insurance plan, it is a good idea to ask around and get recommendations. You will then need to call the office and see if the doctor you have chosen will accept you as a new patient and what types of options they offer for patients who are self-pay. Some doctors offer discounts or will set up payment plans for their patients who do not have insurance, but you will need to ask so you aren't surprised when you get to your appointment. ° °2) Contact Your Local Health Department °Not all health departments have doctors that can see patients for sick visits, but many do, so it is worth a call to see if yours does. If you don't know where your local health department is, you can check in your phone book. The CDC also has a tool to help you locate your state's health department, and many state websites also have listings of all of their local health departments. ° °3) Find a Walk-in Clinic °If your illness is not likely to be very severe or complicated, you may want to try a walk in clinic. These are popping up all over the country in pharmacies, drugstores, and shopping centers. They're usually staffed by nurse practitioners or physician assistants that have been trained to treat common illnesses and complaints. They're usually fairly quick and inexpensive. However, if you have serious medical issues or chronic medical problems, these are probably not your best option. ° °No Primary Care Doctor: °- Call [facility] Connect at  832-8000 - they can help you locate a primary care doctor that  accepts your insurance, provides certain services, etc. °- Physician Referral Service- 1-800-533-3463 ° °Chronic Pain Problems: °Organization         Address  Phone   Notes  °[facility]  ([phone]) [phone] Patients need to be referred by their primary care doctor.  ° °Medication  Assistance: °Organization         Address  Phone   Notes  °[place] [facility] [address] °[place], [place] [address] (336) 641-8030 --Must be a resident of [place] °-- Must have NO insurance coverage whatsoever (no Medicaid/ Medicare, etc.) °-- The pt. MUST have a primary care doctor that directs their care regularly and follows them in the community °  °[facility]  (866) 331-1348   °[employer]  (888) 892-1162   ° °Agencies that provide inexpensive medical care: °Organization         Address  Phone   Notes  °[facility]  (336) 832-8035   °[facility]    (336) 832-7272   °[facility] [address] °[place], [place] [address] (336) 832-4777   °[facility] [address], °[place] (336) 271-4999   °[facility]    (336) 373-0678   °[facility]    (336) 272-1050   °[facility] ° [address], [place] Phone:  (336) 832-4444, Fax:  (336) 832-4440 Hours of Operation:  9 am - 6 pm, M-F.  Also accepts Medicaid/Medicare and self-pay.  °[facility] ° [address], [place] Phone: (336) [phone], Fax: (336) [phone]. Hours of Operation:  8:30 am - 5:30 pm, M-F.  Also accepts Medicaid and self-pay.  °[facility] [place] [address], [place] Phone: (336) 878-6027   °[facility] [address], [place], [place] (336)723-1848, Ext. 123 Mondays & Thursdays: 7-9 AM.  First 15 patients are seen on a first come, first serve basis. °  ° °Medicaid-accepting [place] Providers: ° °Organization         Address  Phone   Notes  °[facility] [address], [place] (336) 641-2100 Also accepts self-pay patients.  °[facility] [address], [place] ° (336) 856-9996   °[facility] [address], [place]  (336) 288-8857   °[facility] [address] [place] [address], [place] (336) 299-7000   °Veita Bland [address], [place]  ° (336) [phone] Only accepts [place] Access Medicaid patients after they have their name applied to their card.  ° °Self-Pay (no insurance) in [place]: ° °Organization         Address  Phone   Notes  °Sickle Cell Patients, [facility] [address], [place] (336) 832-1970   °[facility] [address], [place] (336) 832-4400   °[facility] ° [address], [place] (336) 992-4800   °[facility]/Dr. Osei-Bonsu ° [address] [place] [address], [place] or [address], [place] (336) [phone] Phone number for both [place] and [place] locations is the same.  °[facility] [address], [place] (336) 299-0000   °[facility] [address] [place] [address], [place] or [address] (336) 852-7530 °(336) 878-2260   °[facility] [address], [place] (336) 350-1642, phone; (336) 294-5005, fax Sees patients 1st and 3rd Saturday of every month.  Must not qualify for public or private insurance (i.e. Medicaid, Medicare, [place] Health Choice, Veterans' Benefits) • Household income should be no more than 200% of the poverty level •The clinic cannot treat you if you are pregnant or think you are pregnant • Sexually transmitted diseases are not treated at the clinic.  ° ° °Dental Care: °Organization         Address  Phone  Notes  °[place] [facility] [address], [place] (336) 641-6152 Accepts children up to age 21 who are enrolled in Medicaid or [place] Health Choice; pregnant women with a Medicaid card; and children who have applied for Medicaid or [place] Health Choice, but were declined, whose parents can pay a reduced fee at time of service.  °[place]  [facility] [place]  [address], [place] (336) 641-7733 Accepts children up to age 21 who are enrolled in Medicaid or [place] Health Choice; pregnant women with a Medicaid card; and children who have applied for Medicaid or  Health Choice, but were declined, whose parents can pay a reduced fee at time of service.  °[facility] ° [address], [place] (336) 641-4533 Patients are seen by appointment only. Walk-ins are not accepted. [facility] will see patients 18 years of age and older. °Monday - Tuesday (8am-5pm) °Most Wednesdays (8:30-5pm) °$30 per visit, cash only  °[facility] ° [address], [place] (336) 641-4533 Patients are seen by appointment only. Walk-ins are not accepted. [facility] will see patients 18 years of age and older. °One   Wednesday Evening (Monthly: Volunteer Based).  $30 per visit, cash only  °[facility]  (919) 537-3737 for adults; Children under age 4, call Graduate Pediatric Dentistry at ([phone]) [phone]. Children aged 4-14, please call ([phone]) [phone] to request a pediatric application. ° Dental services are provided in all areas of dental care including fillings, crowns and bridges, complete and partial dentures, implants, gum treatment, root canals, and extractions. Preventive care is also provided. Treatment is provided to both adults and children. °Patients are selected via a lottery and there is often a waiting list. °  °[facility] [address], °[place] ° (336) 763-8833 [email or web address] °  °[facility] [address], [place], [place] (336)723-1848, Ext. 123 Second and Fourth Thursday of each month, opens at 6:30 AM; Clinic ends at 9 AM.  Patients are seen on a first-come first-served basis, and a limited number are seen during each clinic.  ° °[facility] ° [address], [place], [place] (336) [phone]    Eligibility Requirements °You must have lived in [place], [place], or [place] counties for at least the last three months. °  You cannot be eligible for state or federal sponsored healthcare insurance, including Veterans Administration, Medicaid, or Medicare. °  You generally cannot be eligible for healthcare insurance through your employer.  °  How to apply: °Eligibility screenings are held every Tuesday and Wednesday afternoon from 1:00 pm until 4:00 pm. You do not need an appointment for the interview!  °[facility] [address], [place], [place] 336-631-2330   °[facility]  336-342-8273   °[facility]  336-703-3100   °[facility]  336-570-6415   ° °Behavioral Health Resources in the Community: °Intensive Outpatient Programs °Organization         Address  Phone  Notes  °[facility] [address], [place], [place] 336-878-6098   °[facility] [address], [place], [place] 336-832-9800   °[facility] [address], [place], [place] ° 336-882-2125   °[facility] [address],  °[place], [place] 1-800-853-5163 or 336-641-4981   °Substance Abuse Resources °Organization         Address  Phone  Notes  °[facility]  336-882-2125   °[facility]  336-784-9470   °[facility]  336-285-9073   °[facility]  336-845-3988   °Residential & Outpatient Substance Abuse Program  1-800-659-3381   °Psychological Services °Organization         Address  Phone  Notes  °[facility]  336- 832-9600   °[facility]  336- 378-7881   °[facility] [address], [place] 1-800-853-5163 or 336-641-4981   ° °Mobile Crisis Teams °Organization         Address  Phone  Notes  °[facility], Mobile Crisis Care Unit  1-877-626-1772   °Assertive °[facility] ° [address]  [place], [place] 336-834-9664   °Sharon DeEsch [address] °[place] 336-554-5454   ° °Self-Help/Support Groups °Organization         Address  Phone             Notes  °[facility]  - variety of support groups  336- 373-1402 Call for more information  °Narcotics Anonymous (NA), [facility] [address], °[place]  2 meetings at this location  ° °  Residential Treatment Programs Organization         Address  Phone  Notes  [facility] [address]    [place]  1-303-568-1298   [facility]  [address], [place] [address], [place], [place]   [place], [place] 213-180-3705 Admissions: 8am-3pm M-F  [facility]stance [place] [address]    [place], [place] 977-414-2395   [facility] [address] [place], [place], [place]   [facility] [address],  [place], [place]   [facility] Intensive Outpatient [address], [place], [place]   [facility] [address]  [place], [place] 1-361-154-7641 or (626) 025-3328   [facility] [address] [place], [place] Accepts Medicaid  [facility] [place] [address]  [place] 1-(901) 548-1118 Substance Abuse/Addiction Treatment   [facility]         Address  Phone  Notes  [facility]  631-396-1382   Domenic Schwab, PhD [address] [place], [place]   325-459-9178 or 240-206-7652   [place], [place] 708-354-6755   [facility] [address], [place], [place] 404-622-1020 Insurance/Medicaid/sponsorship through [facility] [address]                                    [place], [place] 979-810-3768 [place] [address], [place] [phone]    Dr. Adele Schilder  [phone]   [facility] Dept. 1) [address] [place] 2) [place] 3)  [address], [place] 410-018-8245 650-458-2000  (618) [phone]   [place] 864-448-3589 or (820)[phone] (After Hours)      Take over the counter decongestant, as directed on packaging, for the next week.  Use over the counter normal saline nasal spray, as instructed in the Emergency Department, several times per day for the next 2 weeks.  Call Dr. Haroldine Laws at [facility] today to schedule a follow up appointment within the next week. Call your regular medical doctor today to schedule a follow appointment within the next 2 days.  Return to the Emergency Department immediately if worsening.

## 2014-06-12 NOTE — ED Notes (Signed)
Ambulated patient, heart rate increased from 102 to 116 beats per minute.  Oxygen stayed saturated at 98%.

## 2014-06-12 NOTE — ED Notes (Signed)
Patient states sob x 4 days with associated headaches and fevers, patient states sob intermittent, denies chest pain

## 2014-06-18 ENCOUNTER — Encounter (HOSPITAL_COMMUNITY): Payer: Self-pay | Admitting: *Deleted

## 2014-06-18 ENCOUNTER — Encounter (HOSPITAL_COMMUNITY): Payer: Self-pay

## 2014-06-18 ENCOUNTER — Inpatient Hospital Stay (HOSPITAL_COMMUNITY)
Admission: AD | Admit: 2014-06-18 | Discharge: 2014-06-23 | DRG: 286 | Disposition: A | Discharge status: Home Health | Payer: Medicare Other | Source: Ambulatory Visit | Attending: Internal Medicine | Admitting: Internal Medicine

## 2014-06-18 ENCOUNTER — Ambulatory Visit (HOSPITAL_BASED_OUTPATIENT_CLINIC_OR_DEPARTMENT_OTHER)
Admission: RE | Admit: 2014-06-18 | Discharge: 2014-06-18 | Disposition: A | Discharge status: Home / Self Care | Payer: Medicare Other | Source: Ambulatory Visit | Attending: Cardiology | Admitting: Cardiology

## 2014-06-18 VITALS — BP 100/75 | HR 95 | Resp 18 | Wt 173.0 lb

## 2014-06-18 DIAGNOSIS — I429 Cardiomyopathy, unspecified: Secondary | ICD-10-CM | POA: Diagnosis present

## 2014-06-18 DIAGNOSIS — I213 ST elevation (STEMI) myocardial infarction of unspecified site: Secondary | ICD-10-CM

## 2014-06-18 DIAGNOSIS — I071 Rheumatic tricuspid insufficiency: Secondary | ICD-10-CM | POA: Diagnosis present

## 2014-06-18 DIAGNOSIS — T451X5A Adverse effect of antineoplastic and immunosuppressive drugs, initial encounter: Secondary | ICD-10-CM | POA: Diagnosis present

## 2014-06-18 DIAGNOSIS — Z86718 Personal history of other venous thrombosis and embolism: Secondary | ICD-10-CM

## 2014-06-18 DIAGNOSIS — Z95810 Presence of automatic (implantable) cardiac defibrillator: Secondary | ICD-10-CM | POA: Diagnosis not present

## 2014-06-18 DIAGNOSIS — Z7901 Long term (current) use of anticoagulants: Secondary | ICD-10-CM

## 2014-06-18 DIAGNOSIS — Z794 Long term (current) use of insulin: Secondary | ICD-10-CM

## 2014-06-18 DIAGNOSIS — Z23 Encounter for immunization: Secondary | ICD-10-CM

## 2014-06-18 DIAGNOSIS — I69351 Hemiplegia and hemiparesis following cerebral infarction affecting right dominant side: Secondary | ICD-10-CM | POA: Diagnosis not present

## 2014-06-18 DIAGNOSIS — C8580 Other specified types of non-Hodgkin lymphoma, unspecified site: Secondary | ICD-10-CM

## 2014-06-18 DIAGNOSIS — E119 Type 2 diabetes mellitus without complications: Secondary | ICD-10-CM | POA: Diagnosis present

## 2014-06-18 DIAGNOSIS — F329 Major depressive disorder, single episode, unspecified: Secondary | ICD-10-CM | POA: Diagnosis present

## 2014-06-18 DIAGNOSIS — C8590 Non-Hodgkin lymphoma, unspecified, unspecified site: Secondary | ICD-10-CM | POA: Diagnosis present

## 2014-06-18 DIAGNOSIS — C859A Non-Hodgkin lymphoma, unspecified, in remission: Secondary | ICD-10-CM | POA: Diagnosis present

## 2014-06-18 DIAGNOSIS — E876 Hypokalemia: Secondary | ICD-10-CM | POA: Diagnosis present

## 2014-06-18 DIAGNOSIS — E785 Hyperlipidemia, unspecified: Secondary | ICD-10-CM | POA: Diagnosis present

## 2014-06-18 DIAGNOSIS — Z8572 Personal history of non-Hodgkin lymphomas: Secondary | ICD-10-CM | POA: Diagnosis not present

## 2014-06-18 DIAGNOSIS — R0602 Shortness of breath: Secondary | ICD-10-CM | POA: Diagnosis present

## 2014-06-18 DIAGNOSIS — R509 Fever, unspecified: Secondary | ICD-10-CM | POA: Diagnosis present

## 2014-06-18 DIAGNOSIS — I959 Hypotension, unspecified: Secondary | ICD-10-CM | POA: Diagnosis present

## 2014-06-18 DIAGNOSIS — I2510 Atherosclerotic heart disease of native coronary artery without angina pectoris: Secondary | ICD-10-CM | POA: Diagnosis present

## 2014-06-18 DIAGNOSIS — I5023 Acute on chronic systolic (congestive) heart failure: Secondary | ICD-10-CM

## 2014-06-18 DIAGNOSIS — I509 Heart failure, unspecified: Secondary | ICD-10-CM

## 2014-06-18 DIAGNOSIS — Z01818 Encounter for other preprocedural examination: Secondary | ICD-10-CM

## 2014-06-18 DIAGNOSIS — I319 Disease of pericardium, unspecified: Secondary | ICD-10-CM

## 2014-06-18 DIAGNOSIS — I513 Intracardiac thrombosis, not elsewhere classified: Secondary | ICD-10-CM | POA: Diagnosis present

## 2014-06-18 DIAGNOSIS — R570 Cardiogenic shock: Secondary | ICD-10-CM | POA: Diagnosis present

## 2014-06-18 DIAGNOSIS — Z9114 Patient's other noncompliance with medication regimen: Secondary | ICD-10-CM | POA: Diagnosis present

## 2014-06-18 DIAGNOSIS — R0600 Dyspnea, unspecified: Secondary | ICD-10-CM

## 2014-06-18 DIAGNOSIS — I10 Essential (primary) hypertension: Secondary | ICD-10-CM | POA: Diagnosis present

## 2014-06-18 DIAGNOSIS — Z9861 Coronary angioplasty status: Secondary | ICD-10-CM | POA: Diagnosis not present

## 2014-06-18 HISTORY — DX: Presence of cardiac pacemaker: Z95.0

## 2014-06-18 HISTORY — DX: Presence of automatic (implantable) cardiac defibrillator: Z95.810

## 2014-06-18 LAB — CBC WITH DIFFERENTIAL/PLATELET
Basophils Absolute: 0.1 10*3/uL (ref 0.0–0.1)
Basophils Relative: 1 % (ref 0–1)
EOS PCT: 2 % (ref 0–5)
Eosinophils Absolute: 0.1 10*3/uL (ref 0.0–0.7)
HEMATOCRIT: 40.2 % (ref 39.0–52.0)
Hemoglobin: 13.6 g/dL (ref 13.0–17.0)
LYMPHS ABS: 1.7 10*3/uL (ref 0.7–4.0)
LYMPHS PCT: 39 % (ref 12–46)
MCH: 31.8 pg (ref 26.0–34.0)
MCHC: 33.8 g/dL (ref 30.0–36.0)
MCV: 93.9 fL (ref 78.0–100.0)
MONO ABS: 0.4 10*3/uL (ref 0.1–1.0)
Monocytes Relative: 9 % (ref 3–12)
Neutro Abs: 2 10*3/uL (ref 1.7–7.7)
Neutrophils Relative %: 49 % (ref 43–77)
Platelets: 206 10*3/uL (ref 150–400)
RBC: 4.28 MIL/uL (ref 4.22–5.81)
RDW: 14.2 % (ref 11.5–15.5)
WBC: 4.2 10*3/uL (ref 4.0–10.5)

## 2014-06-18 LAB — COMPREHENSIVE METABOLIC PANEL
ALT: 61 U/L — ABNORMAL HIGH (ref 0–53)
ANION GAP: 12 (ref 5–15)
AST: 24 U/L (ref 0–37)
Albumin: 3.2 g/dL — ABNORMAL LOW (ref 3.5–5.2)
Alkaline Phosphatase: 213 U/L — ABNORMAL HIGH (ref 39–117)
BILIRUBIN TOTAL: 2.4 mg/dL — AB (ref 0.3–1.2)
BUN: 17 mg/dL (ref 6–23)
CHLORIDE: 98 meq/L (ref 96–112)
CO2: 24 meq/L (ref 19–32)
CREATININE: 0.88 mg/dL (ref 0.50–1.35)
Calcium: 8.9 mg/dL (ref 8.4–10.5)
GFR calc Af Amer: >90 mL/min (ref >90)
Glucose, Bld: 252 mg/dL — ABNORMAL HIGH (ref 70–99)
Potassium: 5.1 mEq/L (ref 3.7–5.3)
Sodium: 134 mEq/L — ABNORMAL LOW (ref 137–147)
Total Protein: 5.9 g/dL — ABNORMAL LOW (ref 6.0–8.3)

## 2014-06-18 LAB — MAGNESIUM: Magnesium: 1.8 mg/dL (ref 1.5–2.5)

## 2014-06-18 LAB — GLUCOSE, CAPILLARY: Glucose-Capillary: 272 mg/dL — ABNORMAL HIGH (ref 70–99)

## 2014-06-18 LAB — MRSA PCR SCREENING: MRSA by PCR: NEGATIVE

## 2014-06-18 LAB — CARBOXYHEMOGLOBIN
Carboxyhemoglobin: 1.2 % (ref 0.5–1.5)
Methemoglobin: 0.9 % (ref 0.0–1.5)
O2 SAT: 42.8 %
TOTAL HEMOGLOBIN: 13.8 g/dL (ref 13.5–18.0)

## 2014-06-18 LAB — TROPONIN I: Troponin I: <0.3 ng/mL (ref <0.30)

## 2014-06-18 LAB — PRO B NATRIURETIC PEPTIDE: Pro B Natriuretic peptide (BNP): 2144 pg/mL — ABNORMAL HIGH (ref 0–125)

## 2014-06-18 LAB — TSH: TSH: 1.75 u[IU]/mL (ref 0.350–4.500)

## 2014-06-18 MED ORDER — INFLUENZA VAC SPLIT QUAD 0.5 ML IM SUSY
0.5000 mL | PREFILLED_SYRINGE | INTRAMUSCULAR | Status: AC
  Administered 2014-06-19: 0.5 mL via INTRAMUSCULAR
  Filled 2014-06-18: qty 0.5

## 2014-06-18 MED ORDER — SODIUM CHLORIDE 0.9 % IJ SOLN
3.0000 mL | Freq: Two times a day (BID) | INTRAMUSCULAR | Status: DC
  Administered 2014-06-18 – 2014-06-22 (×8): 3 mL via INTRAVENOUS
  Administered 2014-06-23: 10 mL via INTRAVENOUS

## 2014-06-18 MED ORDER — SODIUM CHLORIDE 0.9 % IJ SOLN: 3.0000 mL | INTRAMUSCULAR | Status: DC | PRN

## 2014-06-18 MED ORDER — LORATADINE 10 MG PO TABS
10.0000 mg | ORAL_TABLET | Freq: Every day | ORAL | Status: DC | PRN
  Filled 2014-06-18: qty 1

## 2014-06-18 MED ORDER — SODIUM CHLORIDE 0.9 % IV SOLN
INTRAVENOUS | Status: DC | PRN
  Administered 2014-06-18: 10 mL/h via INTRAVENOUS
  Administered 2014-06-20 – 2014-06-21 (×2): 250 mL via INTRAVENOUS

## 2014-06-18 MED ORDER — ATORVASTATIN CALCIUM 20 MG PO TABS
20.0000 mg | ORAL_TABLET | Freq: Every day | ORAL | Status: DC
  Administered 2014-06-18 – 2014-06-23 (×6): 20 mg via ORAL
  Filled 2014-06-18 (×6): qty 1

## 2014-06-18 MED ORDER — ALBUTEROL SULFATE (2.5 MG/3ML) 0.083% IN NEBU: 3.0000 mL | INHALATION_SOLUTION | Freq: Four times a day (QID) | RESPIRATORY_TRACT | Status: DC | PRN

## 2014-06-18 MED ORDER — FUROSEMIDE 10 MG/ML IJ SOLN
80.0000 mg | Freq: Two times a day (BID) | INTRAMUSCULAR | Status: DC
  Administered 2014-06-18: 80 mg via INTRAVENOUS
  Filled 2014-06-18 (×3): qty 8

## 2014-06-18 MED ORDER — ONDANSETRON HCL 4 MG/2ML IJ SOLN: 4.0000 mg | Freq: Four times a day (QID) | INTRAMUSCULAR | Status: DC | PRN

## 2014-06-18 MED ORDER — DIGOXIN 125 MCG PO TABS
0.1250 mg | ORAL_TABLET | Freq: Every day | ORAL | Status: DC
  Administered 2014-06-18 – 2014-06-23 (×6): 0.125 mg via ORAL
  Filled 2014-06-18 (×6): qty 1

## 2014-06-18 MED ORDER — MILRINONE IN DEXTROSE 20 MG/100ML IV SOLN
0.2500 ug/kg/min | INTRAVENOUS | Status: DC
  Administered 2014-06-18 – 2014-06-23 (×8): 0.25 ug/kg/min via INTRAVENOUS
  Filled 2014-06-18 (×8): qty 100

## 2014-06-18 MED ORDER — PNEUMOCOCCAL VAC POLYVALENT 25 MCG/0.5ML IJ INJ
0.5000 mL | INJECTION | INTRAMUSCULAR | Status: AC
  Administered 2014-06-19: 0.5 mL via INTRAMUSCULAR
  Filled 2014-06-18: qty 0.5

## 2014-06-18 MED ORDER — INSULIN GLARGINE 100 UNIT/ML ~~LOC~~ SOLN
40.0000 [IU] | Freq: Every day | SUBCUTANEOUS | Status: DC
Start: 2014-06-18 — End: 2014-06-20
  Administered 2014-06-18 – 2014-06-19 (×2): 40 [IU] via SUBCUTANEOUS
  Filled 2014-06-18 (×3): qty 0.4

## 2014-06-18 MED ORDER — SODIUM CHLORIDE 0.9 % IV SOLN
250.0000 mL | INTRAVENOUS | Status: DC | PRN
  Administered 2014-06-18: 250 mL via INTRAVENOUS

## 2014-06-18 MED ORDER — RIVAROXABAN 20 MG PO TABS
20.0000 mg | ORAL_TABLET | Freq: Every day | ORAL | Status: DC
  Administered 2014-06-18 – 2014-06-22 (×5): 20 mg via ORAL
  Filled 2014-06-18 (×6): qty 1

## 2014-06-18 MED ORDER — LOSARTAN POTASSIUM 25 MG PO TABS
25.0000 mg | ORAL_TABLET | Freq: Every day | ORAL | Status: DC
Start: 2014-06-18 — End: 2014-06-19
  Administered 2014-06-18: 25 mg via ORAL
  Filled 2014-06-18 (×2): qty 1

## 2014-06-18 MED ORDER — BENZONATATE 100 MG PO CAPS: 100.0000 mg | ORAL_CAPSULE | Freq: Three times a day (TID) | ORAL | Status: DC | PRN

## 2014-06-18 MED ORDER — ACETAMINOPHEN 325 MG PO TABS
650.0000 mg | ORAL_TABLET | ORAL | Status: DC | PRN
  Administered 2014-06-19 – 2014-06-21 (×2): 650 mg via ORAL
  Filled 2014-06-18 (×2): qty 2

## 2014-06-18 NOTE — Plan of Care (Signed)
Problem: Phase I Progression Outcomes Goal: Pain controlled with appropriate interventions Outcome: Not Applicable Date Met:  35/46/56 Goal: Voiding-avoid urinary catheter unless indicated Outcome: Completed/Met Date Met:  06/18/14

## 2014-06-18 NOTE — Progress Notes (Signed)
Patient ID: Andre Tran, male   DOB: 08-15-1960, 53 y.o.   MRN: [id number] Physicians EP : Dr Lovena Le VA: Dr Jenny Reichmann Oncologist: Dr Alen Blew  PCP: Dr. Maudie Mercury ([facility])  HPI: 53 y.o. 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  He has h/o recurrent lymphoma (1993, 2007) treated with chemo (including adriamycin) - details unclear.  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   Evaluated in [facility] ED 11/02/12 due to dyspnea and later discharged. He had been out of lasix for 1 month. He was restarted on lasix 40 mg daily. CEs negative. Pro BNP 1633.   He returns for follow up. The week of Thanksgiving he was evaluated in [place] due to increased SOB and late discharged. He returned to [facility] and went back to [facility] ED 06/12/14 with increased dyspnea and sinus congestion. CXR was ok. He was instructed follow up in the [facility] ED. Today he presents with increased fatigue and dyspnea. Can walk a few steps then has to stop. Difficulty sleeping. Sleeping on 6 pillows.  Poor appetite. Weight at home 157-160 pounds. Taking medications but I do not see Xarelto or lasix in his bag.   12/12/12 CPX Peak VO2: 16.9 ml/kg/min % predicted peak VO2: 46.9% VE/VCO2 slope: 33.3 OUES: 1.36 Peak RER: 1.19 Moderate to severe functional limitation due to heart failure.  12/16/11 ECHO EF 15% global HK. No apical thrombus.  12/04/12 ECHO EF 15% RV mildly hypokinetic (reviewed personally)  Labs 01/16/13 K 4.7 Creatinine 1.1 Labs 03/21/13 Dig level 0.3  Labs 06/20/13: K+ 3.4, Cr 0.94, pro-BNP 3235 Labs: 06/12/14 K 5.3 Creatinine 1.04 Pro BNP 2079. Dig level < 0.3      SH: Lives with wife in [place]. He is not a smoker or drink alcohol.   ROS: All systems negative except as listed in HPI, PMH and Problem List.  Past Medical History  Diagnosis Date  .  Paroxysmal ventricular tachycardia     s/p AICD '05, replaced w/ St. Jude's in 2010 (PVT/V.Fib arrest requiring ICD implant in 2005)  . HYPERTENSION, UNSPECIFIED   . HYPERLIPIDEMIA-MIXED   . 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 Nonischemic cardiomyopathy     2/2 Adriamycin administration for lymphoma  . CHF (congestive heart failure)     EF 15% by echo 2009; s/p St. Jude ICD  . Diabetes mellitus     Type 2  . Cancer      non hodkins lymphoma 1992, Hodgkins 2007  . Depression   . LV (left ventricular) mural thrombus     2009, on coumadin  . Small bowel obstruction     s/p Small bowel resection 2001  . Jejunal intussusception     2008  . 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 Splenic mass     Noted on Abd [place] 2009 w/ recs for f/u CT - not done  . Hypotension   . Noncompliance with medication regimen     Current Outpatient Prescriptions  Medication Sig Dispense Refill  . albuterol (PROVENTIL HFA;VENTOLIN HFA) 108 (90 BASE) MCG/ACT inhaler Inhale 1-2 puffs into the lungs every 6 (six) hours as needed for wheezing or shortness of breath.    Marland Kitchen  amoxicillin (AMOXIL) 500 MG capsule Take 500 mg by mouth 3 (three) times daily.    Marland Kitchen atorvastatin (LIPITOR) 20 MG tablet Take 20 mg by mouth daily.    . benzonatate (TESSALON) 100 MG capsule Take 1 capsule (100 mg total) by mouth 3 (three) times daily as needed for cough. 15 capsule 0  . carvedilol (COREG) 6.25 MG tablet Take 3.125 mg by mouth 2 (two) times daily with a meal.     . digoxin (LANOXIN) 0.125 MG tablet Take 0.125 mg by mouth daily.    . furosemide (LASIX) 40 MG tablet Take 2 tablets (80 mg total) by mouth daily. (Patient taking differently: Take 40 mg by mouth daily. ) 60 tablet 3  . insulin glargine (LANTUS) 100 UNIT/ML injection Inject 40 Units into the skin at bedtime.     Marland Kitchen  loratadine (CLARITIN) 10 MG tablet Take 10 mg by mouth daily as needed for allergies.    Marland Kitchen losartan (COZAAR) 100 MG tablet Take 50 mg by mouth daily.    . Nutritional Supplements (COLD AND FLU PO) Take 30 mLs by mouth daily as needed (for cold).     No current facility-administered medications for this encounter.     Filed Vitals:   06/18/14 0916  BP: 100/75  Pulse: 95  Resp: 18  Weight: 173 lb (78.472 kg)  SpO2: 100%   PHYSICAL EXAM: General:  Chronically ill  appearing. Appears fatigued. No resp difficulty Wife present HEENT: normal Neck: supple. JVP to jaw. Carotids 2+ bilaterally; no bruits. No lymphadenopathy or thryomegaly appreciated. Cor: PMI normal. Regular rate & rhythm. No rubs, gallops or murmurs. Lungs: CTA Abdomen: soft, nontender,  nondistended. No hepatosplenomegaly. No bruits or masses. Good bowel sounds. Extremities: no cyanosis, clubbing, rash, edema Neuro: alert & orientedx3, cranial nerves grossly intact. Moves all 4 extremities w/o difficulty. Affect pleasant.  ASSESSMENT/ PLAN: 1. Chronic systolic HF: NICM likely due to adriamycin; EF 15% (11/2012) ST Jude ICD.  - Functional decline over the last few weeks. Evaluated at an [facility] and [facility] and discharged the same day. He has had progressive dyspnea on exertion. NYHA III-IV. Volume status elevated. Will need to diurese with IV lasix. Hold carvedilol. Continue digoxin 0.125 mg daily. Cut back losartan to 25 mg daily for soft BP.    Place PICC for CVP CO-OX. Concern for low output. May need inotropes.   2. H/o LV thrombus - Has not been taking Xarelto . Will need to restart. Should get VQ  with dyspnea. Repeat ECHO in hospital.   3. Non Hodgkins Lymphoma- He has h/o recurrent lymphoma (1993, 2007) treated with chemo (including adriamycin) -Last seen by Dr Alen Blew  02/2013. No evidence of recurrence at that time.   Admit to 2H to diurese with IV lasix. Place PICC for CVP and CO-OX. Will need RHC  after fully diuresed.   Cherilynn Schomburg NP-C 06/18/2014 9:26 AM

## 2014-06-18 NOTE — Care Management Note (Addendum)
    Page 1 of 2   06/20/2014     3:55:52 PM CARE MANAGEMENT NOTE 06/20/2014  Patient:  Andre Tran, Andre Tran   Account Number:  [id number]  Date Initiated:  06/18/2014  Documentation initiated by:  Elissa Hefty  Subjective/Objective Assessment:   adm w heart failure     Action/Plan:   lives w wife   Anticipated DC Date:  06/21/2014   Anticipated DC Plan:  [place]  CM consult  HF Clinic      Choice offered to / List presented to:     DME arranged  IV PUMP/EQUIPMENT        HH arranged  HH-1 RN  [place].   Status of service:   Medicare Important Message given?  YES (If response is "NO", the following Medicare IM given date fields will be blank) Date Medicare IM given:  06/20/2014 Medicare IM given by:  Chari Parmenter Date Additional Medicare IM given:   Additional Medicare IM given by:    Discharge Disposition:    Per UR Regulation:  Reviewed for med. necessity/level of care/duration of stay  If discussed at [place] of Stay Meetings, dates discussed:    Comments:  ContactIla Mcgill [phone]  06/20/14 [facility] is active - providing home health RN and home infusion tx.  12/9 1330 debbie dowell rn,bsn followed in heart failure clinic. may need hhrn will follow and assist as pt progresses.

## 2014-06-18 NOTE — Procedures (Addendum)
Pulmonary Artery Catheter Insertion Procedure Note Andre Tran [id number] [birth year]  Procedure: Insertion of Pulmonary Artery Catheter; Right hear catheterization Indications: Assessment of intravascular volume and cardiac output  Procedure Details Consent: Risks of procedure as well as the alternatives and risks of each were explained to the (patient/caregiver).  Consent for procedure obtained. Time Out: Verified patient identification, verified procedure, site/side was marked, verified correct patient position, special equipment/implants available, medications/allergies/relevent history reviewed, required imaging and test results available.  Performed  Maximum sterile technique was used including antiseptics, cap, gloves, gown, hand hygiene, mask and sheet. Skin prep: Chlorhexidine; local anesthetic administered A 7.5 FR venous sheath was placed in the right internal jugular vein using the Seldinger technique. Once the sheath was placed a standard Swan-Ganz catheter was maneuvered into the pulmonary artery using pressure waveform guidance. A full RHC was performed during the procedure. (results in f/u note)  Evaluation Blood flow good Complications: No apparent complications Patient did tolerate procedure well. Chest X-ray ordered to verify placement.  CXR: pending.  Glori Bickers MD 06/18/2014, 12:34 PM

## 2014-06-18 NOTE — Progress Notes (Signed)
  Echocardiogram 2D Echocardiogram has been performed.  Andre Tran 06/18/2014, 4:37 PM

## 2014-06-18 NOTE — Discharge Instructions (Signed)
Information on my medicine - XARELTO (Rivaroxaban)  This medication education was reviewed with me or my healthcare representative as part of my discharge preparation.  The pharmacist that spoke with me during my hospital stay was:  Georgina Peer, [facility]  Why was Xarelto prescribed for you? Xarelto was prescribed for you to reduce the risk of a blood clot forming.  What do you need to know about xarelto ? Take your Xarelto ONCE DAILY at the same time every day with your evening meal. If you have difficulty swallowing the tablet whole, you may crush it and mix in applesauce just prior to taking your dose.  Take Xarelto exactly as prescribed by your doctor and DO NOT stop taking Xarelto without talking to the doctor who prescribed the medication.  Stopping without other stroke prevention medication to take the place of Xarelto may increase your risk of developing a clot that causes a stroke.  Refill your prescription before you run out.  After discharge, you should have regular check-up appointments with your healthcare provider that is prescribing your Xarelto.  In the future your dose may need to be changed if your kidney function or weight changes by a significant amount.  What do you do if you miss a dose? If you are taking Xarelto ONCE DAILY and you miss a dose, take it as soon as you remember on the same day then continue your regularly scheduled once daily regimen the next day. Do not take two doses of Xarelto at the same time or on the same day.   Important Safety Information A possible side effect of Xarelto is bleeding. You should call your healthcare provider right away if you experience any of the following: ? Bleeding from an injury or your nose that does not stop. ? Unusual colored urine (red or dark brown) or unusual colored stools (red or black). ? Unusual bruising for unknown reasons. ? A serious fall or if you hit your head (even if there is no bleeding).  Some  medicines may interact with Xarelto and might increase your risk of bleeding while on Xarelto. To help avoid this, consult your healthcare provider or pharmacist prior to using any new prescription or non-prescription medications, including herbals, vitamins, non-steroidal anti-inflammatory drugs (NSAIDs) and supplements.  This website has more information on Xarelto: [email or web address]

## 2014-06-18 NOTE — H&P (Signed)
ADVANCED HEART FAILURE H&P    Physicians EP : Dr Lovena Le VA: Dr Jenny Reichmann Oncologist: Dr Alen Blew  PCP: Dr. Maudie Mercury ([facility])  HPI: 53 y.o. 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 He has h/o recurrent lymphoma (1993, 2007) treated with chemo (including adriamycin) - details unclear. 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   He has been followed closely in the HF clinic for possible advanced  therapies. He was last seen and at that he was doing well with minimal dyspnea. Today he presented in the HF clinic with increased dyspnea over the last few weeks. The week of Thanksgiving he was evaluated in [place] due to increased SOB and later discharged. He returned to [facility] and went back to [facility] ED 06/12/14 with increased dyspnea and sinus congestion. CXR was ok. He was instructed follow up in the HF clinic.  Today he presents with increased fatigue and dyspnea. Can walk a few steps then has to stop. Difficulty sleeping. Sleeping on 6 pillows. Poor appetite. Weight at home 157-160 pounds. Taking medications but I did not see  Xarelto or lasix in his bag.   12/12/12 CPX Peak VO2: 16.9 ml/kg/min % predicted peak VO2: 46.9% VE/VCO2 slope: 33.3 OUES: 1.36 Peak RER: 1.19 Moderate to severe functional limitation due to heart failure.  12/04/12 ECHO EF 15% RV mildly hypokinetic (reviewed personally)  Labs: 06/12/14 K 5.3 Creatinine 1.04 Pro BNP 2079. Dig level < 0.3     Review of Systems:     Cardiac Review of Systems: {Y] = yes [ ]  = no  Chest Pain [    ]  Resting SOB [ Y  ] Exertional SOB  [ Y ]  Orthopnea [  Y]   Pedal Edema [   ]    Palpitations [  ] Syncope  [  ]   Presyncope [   ]  General Review of Systems: [Y] = yes [  ]=no Constitional: recent weight change [  ]; anorexia [  ]; fatigue [Y  ]; nausea [   ]; night sweats [  ]; fever [  ]; or chills [  ];                                                                      Dental: poor dentition[ y ];   Eye : blurred vision [  ]; diplopia [   ]; vision changes [  ];  Amaurosis fugax[  ]; Resp: cough [Y  ];  wheezing[  ];  hemoptysis[  ]; shortness of breath[  ]; paroxysmal nocturnal dyspnea[  ]; dyspnea on exertion[ Y ]; or orthopnea[ Y ];  GI:  gallstones[  ], vomiting[  ];  dysphagia[  ]; melena[  ];  hematochezia [  ]; heartburn[  ];   GU: kidney stones [  ]; hematuria[  ];   dysuria [  ];  nocturia[  ];               Skin: rash [  ], swelling[  ];, hair loss[  ];  peripheral edema[  ];  or itching[  ]; Musculosketetal: myalgias[  ];  joint swelling[  ];  joint erythema[  ];  joint pain[ Y ];  back pain[  ];  Heme/Lymph: bruising[  ];  bleeding[  ];  anemia[  ];  Neuro: TIA[  ];  headaches[  ];  stroke[  ];  vertigo[  ];  seizures[  ];   paresthesias[  ];  difficulty walking[  ];  Psych:depression[  ]; anxiety[  ];  Endocrine: diabetes[Y  ];  thyroid dysfunction[  ];  Other:  Past Medical History  Diagnosis Date  . Paroxysmal ventricular tachycardia     s/p AICD '05, replaced w/ St. Jude's in 2010 (PVT/V.Fib arrest requiring ICD implant in 2005)  . HYPERTENSION, UNSPECIFIED   . HYPERLIPIDEMIA-MIXED   . 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 Nonischemic cardiomyopathy     2/2 Adriamycin administration for lymphoma  . CHF (congestive heart failure)     EF 15% by echo 2009; s/p St. Jude ICD  . Diabetes mellitus     Type 2  . Cancer      non hodkins lymphoma 1992, Hodgkins 2007  . Depression   . LV (left ventricular) mural thrombus     2009, on coumadin  . Small bowel obstruction     s/p Small bowel resection 2001  . Jejunal intussusception     2008  . Thrombus     Chronic thrombus left iliac vein w/ extension into the  IVC  . Splenic mass     Noted on Abd [place] 2009 w/ recs for f/u CT - not done  . Hypotension   . Noncompliance with medication regimen        No Known Allergies  History   Social History  . Marital Status: Married    Spouse Name: N/A    Number of Children: N/A  . Years of Education: N/A   Occupational History  . Not on file.   Social History Main Topics  . Smoking status: Never Smoker   . Smokeless tobacco: Never Used  . Alcohol Use: No     Comment: ON SPECIAL OCCASIONS  . Drug Use: No  . Sexual Activity: No   Other Topics Concern  . Not on file   Social History Narrative    Family History  Problem Relation Age of Onset  . Other      No known family h/o heart disease  . Heart disease    . Diabetes Mother   . Other Other     complications from hip replacement    PHYSICAL EXAM: Filed Vitals:   06/18/14 1100  BP: 124/85  Pulse: 88  Temp: 97.6 F (36.4 C)   General:  Fatigued appearing. Lying  Flat in bed,No respiratory difficulty HEENT: normal Neck: supple. no JVD 7-8 with HJR  Carotids 2+ bilat; no bruits. No lymphadenopathy or thryomegaly appreciated. Cor: PMI laterally displaced. Regular rate & rhythm. No rubs, gallops or murmurs. Lungs: clear Abdomen: soft, nontender, nondistended. No hepatosplenomegaly. No bruits or masses. Good bowel sounds. Extremities: no cyanosis, clubbing, rash, edema. Slightly cool Neuro: alert & oriented x 3, cranial nerves grossly intact. moves all 4 extremities w/o difficulty. Affect pleasant.    No results found for this or any previous visit (from the past 24 hour(s)). No results found.   ASSESSMENT: 1. A/C Systolic Heart Failure - NICM thought to be  from adriamycin 2. H/O LV thrombus- Has not had Xarelto? Unsure when he last had it 3. H/O Non Hodgkins Lymphoma- He has h/o recurrent lymphoma (1993, 2007) treated with chemo (including adriamycin) -Last seen by Dr Alen Blew 02/2013. No evidence of recurrence at that time.   4. H/O Extensive thrombus in the left common femoral vein, left iliac  veins and IVC this was noted on CT of in 2014.    PLAN/DISCUSSION:  Andre Tran is a  63 year well known to HF team and has been followed closely in the event he needs advanced therapies. He has NICM though to be chemo induced as he received adriamycin in the past. We had planned to repeat CPX in the next few months.     Today he is being admitted from the HF clinic with progressive dyspnea on exertion and dyspnea at rest. He does have volume overload present . Plan to place swan to guide diuresis and provide additional data regarding hemodynamics. Hold bb for now until we can assess swan numbers. May need inotropes. Continue digoxin. Cut back losartan for now. Check labs and ECHO  Will need to restart Xarelto.   He was last seen by Dr Alen Blew 02/2013 and at that time there was not evidence of recurrence.    Amy Clegg NP-C  Patient seen and examined with Darrick Grinder, NP. We discussed all aspects of the encounter. I agree with the assessment and plan as stated above.   Difficult situation. I do not see significant volume overload and weight is stable from previous HF visits (ER weight recently was wrong). That said, he now is c/o progressive Class IIIB-IV symptoms in setting of severe LV dysfunction. Will perform RHC to further sort out. If numbers ok, will need CPX soon as well.   Sharece Fleischhacker,MD 11:37 AM

## 2014-06-19 ENCOUNTER — Inpatient Hospital Stay (HOSPITAL_COMMUNITY): Payer: Medicare Other

## 2014-06-19 DIAGNOSIS — R570 Cardiogenic shock: Secondary | ICD-10-CM

## 2014-06-19 DIAGNOSIS — I5023 Acute on chronic systolic (congestive) heart failure: Secondary | ICD-10-CM | POA: Diagnosis not present

## 2014-06-19 DIAGNOSIS — E119 Type 2 diabetes mellitus without complications: Secondary | ICD-10-CM

## 2014-06-19 LAB — GLUCOSE, CAPILLARY
GLUCOSE-CAPILLARY: 235 mg/dL — AB (ref 70–99)
Glucose-Capillary: 224 mg/dL — ABNORMAL HIGH (ref 70–99)
Glucose-Capillary: 348 mg/dL — ABNORMAL HIGH (ref 70–99)
Glucose-Capillary: 168 mg/dL — ABNORMAL HIGH (ref 70–99)
Glucose-Capillary: 220 mg/dL — ABNORMAL HIGH (ref 70–99)
Glucose-Capillary: 238 mg/dL — ABNORMAL HIGH (ref 70–99)

## 2014-06-19 LAB — BASIC METABOLIC PANEL
Anion gap: 11 (ref 5–15)
BUN: 18 mg/dL (ref 6–23)
CHLORIDE: 95 meq/L — AB (ref 96–112)
CO2: 28 meq/L (ref 19–32)
CREATININE: 0.98 mg/dL (ref 0.50–1.35)
Calcium: 8.8 mg/dL (ref 8.4–10.5)
GFR calc Af Amer: >90 mL/min (ref >90)
GFR calc non Af Amer: >90 mL/min (ref >90)
Glucose, Bld: 230 mg/dL — ABNORMAL HIGH (ref 70–99)
Potassium: 3.6 mEq/L — ABNORMAL LOW (ref 3.7–5.3)
Sodium: 134 mEq/L — ABNORMAL LOW (ref 137–147)

## 2014-06-19 LAB — CARBOXYHEMOGLOBIN
CARBOXYHEMOGLOBIN: 1.6 % — AB (ref 0.5–1.5)
METHEMOGLOBIN: 0.8 % (ref 0.0–1.5)
O2 Saturation: 58.9 %
Total hemoglobin: 13.2 g/dL — ABNORMAL LOW (ref 13.5–18.0)

## 2014-06-19 LAB — HEMOGLOBIN A1C
HEMOGLOBIN A1C: 7.9 % — AB (ref <5.7)
Mean Plasma Glucose: 180 mg/dL — ABNORMAL HIGH (ref <117)

## 2014-06-19 LAB — TROPONIN I: Troponin I: <0.3 ng/mL (ref <0.30)

## 2014-06-19 MED ORDER — SPIRONOLACTONE 12.5 MG HALF TABLET
12.5000 mg | ORAL_TABLET | Freq: Every day | ORAL | Status: DC
  Administered 2014-06-19: 12.5 mg via ORAL
  Filled 2014-06-19 (×2): qty 1

## 2014-06-19 MED ORDER — FUROSEMIDE 40 MG PO TABS
40.0000 mg | ORAL_TABLET | Freq: Every day | ORAL | Status: DC
  Administered 2014-06-19: 40 mg via ORAL
  Filled 2014-06-19 (×2): qty 1

## 2014-06-19 MED ORDER — INSULIN ASPART 100 UNIT/ML ~~LOC~~ SOLN
1.0000 [IU] | Freq: Three times a day (TID) | SUBCUTANEOUS | Status: DC
  Administered 2014-06-19: 2 [IU] via SUBCUTANEOUS
  Administered 2014-06-19: 3 [IU] via SUBCUTANEOUS

## 2014-06-19 MED ORDER — POTASSIUM CHLORIDE CRYS ER 20 MEQ PO TBCR
40.0000 meq | EXTENDED_RELEASE_TABLET | Freq: Two times a day (BID) | ORAL | Status: AC
  Administered 2014-06-19 (×2): 40 meq via ORAL
  Filled 2014-06-19 (×2): qty 2

## 2014-06-19 MED ORDER — LOSARTAN POTASSIUM 50 MG PO TABS
50.0000 mg | ORAL_TABLET | Freq: Every day | ORAL | Status: DC
  Administered 2014-06-19 – 2014-06-23 (×4): 50 mg via ORAL
  Filled 2014-06-19 (×5): qty 1

## 2014-06-19 MED ORDER — INSULIN ASPART 100 UNIT/ML ~~LOC~~ SOLN: 1.0000 [IU] | SUBCUTANEOUS | Status: DC

## 2014-06-19 MED ORDER — INSULIN ASPART 100 UNIT/ML ~~LOC~~ SOLN
1.0000 [IU] | Freq: Three times a day (TID) | SUBCUTANEOUS | Status: DC
  Administered 2014-06-20: 3 [IU] via SUBCUTANEOUS

## 2014-06-19 NOTE — Progress Notes (Signed)
Advanced Heart Failure Rounding Note   Subjective:    53 y.o. 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 He has h/o recurrent lymphoma (1993, 2007) treated with chemo (including adriamycin) - details unclear. 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   Admitted yesterday from HF clinic with increased dyspnea and fatigue. Swan placed. CO-OX 42%. He was started on milrinone 0.25 mcg and diuresed with IV lasix. Beta blocker was held. Weight down 7 pounds. Feels much better. No orthopnea or PND.   Swan #s RA 9 PA 52/33 (41) PCWP 22 Thermo CO/CI 3.7/2.0 SVR 1707 PVR 4.1 WU   CO-OX  42%>  58% on Milrinone 0.25 mcg   Objective:   Weight Range:  Vital Signs:   Temp:  [96.4 F (35.8 C)-98.6 F (37 C)] 97 F (36.1 C) (12/10 0600) Pulse Rate:  [77-105] 77 (12/10 0600) Resp:  [11-29] 16 (12/10 0600) BP: (100-142)/(34-104) 113/78 mmHg (12/10 0600) SpO2:  [95 %-100 %] 98 % (12/10 0600) Weight:  [164 lb 10.9 oz (74.7 kg)-173 lb (78.472 kg)] 164 lb 10.9 oz (74.7 kg) (12/10 0500) Last BM Date: 06/17/14  Weight change: Filed Weights   06/18/14 1100 06/18/14 1158 06/19/14 0500  Weight: 173 lb (78.472 kg) 171 lb 15.3 oz (78 kg) 164 lb 10.9 oz (74.7 kg)    Intake/Output:   Intake/Output Summary (Last 24 hours) at 06/19/14 0701 Last data filed at 06/19/14 0600  Gross per 24 hour  Intake 1011.63 ml  Output   4650 ml  Net -3638.37 ml     Physical Exam: General:  Sitting up in bed. No resp difficulty HEENT: normal Neck: supple. RIJ swan  Carotids 2+ bilat; no bruits. No lymphadenopathy or thryomegaly appreciated. R swan  Cor: PMI laterally displaced. Regular rate & rhythm. +s3 Lungs: clear Abdomen: soft, nontender, nondistended. No hepatosplenomegaly. No bruits or masses. Good bowel  sounds. Extremities: no cyanosis, clubbing, rash, edema Neuro: alert & orientedx3, cranial nerves grossly intact. moves all 4 extremities w/o difficulty. Affect pleasant  Telemetry: NSR 70-80s   Labs: Basic Metabolic Panel:  Recent Labs Lab 06/12/14 1415 06/18/14 1128 06/19/14 0430  NA 131* 134* 134*  K 5.3 5.1 3.6*  CL 95* 98 95*  CO2 22 24 28   GLUCOSE 241* 252* 230*  BUN 21 17 18   CREATININE 1.04 0.88 0.98  CALCIUM 9.3 8.9 8.8  MG  --  1.8  --     Liver Function Tests:  Recent Labs Lab 06/18/14 1128  AST 24  ALT 61*  ALKPHOS 213*  BILITOT 2.4*  PROT 5.9*  ALBUMIN 3.2*   No results for input(s): LIPASE, AMYLASE in the last 168 hours. No results for input(s): AMMONIA in the last 168 hours.  CBC:  Recent Labs Lab 06/12/14 1415 06/18/14 1128  WBC 6.7 4.2  NEUTROABS  --  2.0  HGB 14.1 13.6  HCT 41.6 40.2  MCV 93.9 93.9  PLT 232 206    Cardiac Enzymes:  Recent Labs Lab 06/18/14 1128 06/18/14 1650 06/19/14 0430  TROPONINI <0.30 <0.30 <0.30    BNP: BNP (last 3 results)  Recent Labs  06/20/13 0750 06/12/14 1415 06/18/14 1200  PROBNP 3235.0* 2079.0* 2144.0*     Other results:    Imaging:  No results found.   Medications:     Scheduled Medications: .  atorvastatin  20 mg Oral Daily  . digoxin  0.125 mg Oral Daily  . furosemide  80 mg Intravenous BID  . Influenza vac split quadrivalent PF  0.5 mL Intramuscular Tomorrow-1000  . insulin glargine  40 Units Subcutaneous QHS  . losartan  25 mg Oral Daily  . pneumococcal 23 valent vaccine  0.5 mL Intramuscular Tomorrow-1000  . rivaroxaban  20 mg Oral Q supper  . sodium chloride  3 mL Intravenous Q12H     Infusions: . milrinone 0.25 mcg/kg/min (06/19/14 0400)     PRN Medications:  sodium chloride, sodium chloride, acetaminophen, albuterol, benzonatate, loratadine, ondansetron (ZOFRAN) IV, sodium chloride   Assessment:   1. 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 H/O LV thrombus 3. H/O Non Hodgkins Lymphoma- He has h/o recurrent lymphoma (1993, 2007) treated with chemo (including adriamycin) -Last seen by Dr Alen Blew 02/2013. No evidence of recurrence at that time.  4. H/O Extensive thrombus in the left common femoral vein, left iliac  veins and IVC this was noted on CT of in 2014.  5. DM  6. Hypokalemia 7. Blood type O+  Plan/Discussion:    Initial CO-OX 42% but improved on milrinone 0.25 mcg. Check Luiz Blare numbers this am with Dr Haroldine Laws. Volume status much improved. CVP 6 .  Weight down 7 pounds. Stop IV lasix and transition to 40 mg po lasix daily. No beta blocker for now due to low output. Increase losartan to 50 mg daily. Supplement potassium . Keep swan.   Back on Xarelto 20 mg daily. Had been off prior to admit and he was not aware.   Glucose uncontrolled. Consult diabetes coordinator.   Length of Stay: 1   CLEGG,AMY NP-C  06/19/2014, 7:01 AM  Advanced Heart Failure Team Pager [phone] (M-F; [place])  Please contact [facility] for night-coverage after hours (4p -7a ) and weekends on [email or web address]  Patient seen and examined with Darrick Grinder, NP. We discussed all aspects of the encounter. I agree with the assessment and plan as stated above.   Swan numbers c/w cardiogenic shock. He is improved with milrinone. Volume status and PVR still mildly elevated. Will continue IV diuresis. Hold b-blocker. Increase losartan to 50 daily. Continue digoxin. Start spiro 12.5. May need sildenafil. Supp K+.   Long talk about advanced therapies and he is interested in considering VAD. Will start the w/u. Will need PICC line placed with IR to go home on milrinone.   The patient is critically ill with multiple organ systems failure and requires high complexity decision making for assessment and support, frequent evaluation and titration of therapies, application of advanced monitoring technologies and extensive  interpretation of multiple databases.   Critical Care Time devoted to patient care services described in this note is 35 Minutes.   Benay Spice 3:47 PM

## 2014-06-19 NOTE — CV Procedure (Signed)
[facility] from 12/9   RA 15 RV  58/14 PAP 57/35 (44) PCWP 24 Thermo CO/CI 2.8/1.5 SVR 2317 PVR 7.0  WU  Numbers c/w cardiogenic shock. Will start milrinone.   Benay Spice 3:36 PM

## 2014-06-19 NOTE — Plan of Care (Signed)
Problem: Consults Goal: Heart Failure Patient Education (See Patient Education module for education specifics.)  Outcome: Completed/Met Date Met:  06/19/14 Goal: Skin Care Protocol Initiated - if Braden Score 18 or less If consults are not indicated, leave blank or document N/A  Outcome: Not Applicable Date Met:  20/10/07 Goal: Tobacco Cessation referral if indicated Outcome: Not Applicable Date Met:  06/28/74 Goal: Nutrition Consult-if indicated Outcome: Not Applicable Date Met:  88/32/54 Goal: Diabetes Guidelines if Diabetic/Glucose > 140 If diabetic or lab glucose is > 140 mg/dl - Initiate Diabetes/Hyperglycemia Guidelines & Document Interventions  Outcome: Progressing  Problem: Phase I Progression Outcomes Goal: Dyspnea controlled at rest (HF) Outcome: Progressing Goal: EF % per last Echo/documented,Core Reminder form on chart Outcome: Completed/Met Date Met:  06/19/14 Goal: Hemodynamically stable Outcome: Completed/Met Date Met:  06/19/14  Problem: Phase II Progression Outcomes Goal: Pain controlled Outcome: Progressing Goal: Dyspnea controlled with activity Outcome: Progressing Goal: Tolerating diet Outcome: Completed/Met Date Met:  06/19/14 Goal: Fluid volume status improved Outcome: Progressing  Problem: Phase III Progression Outcomes Goal: Pain controlled on oral analgesia Outcome: Completed/Met Date Met:  06/19/14 Goal: Tolerating diet Outcome: Completed/Met Date Met:  06/19/14

## 2014-06-20 ENCOUNTER — Inpatient Hospital Stay (HOSPITAL_COMMUNITY): Payer: Medicare Other

## 2014-06-20 LAB — GLUCOSE, CAPILLARY
GLUCOSE-CAPILLARY: 165 mg/dL — AB (ref 70–99)
Glucose-Capillary: 253 mg/dL — ABNORMAL HIGH (ref 70–99)
Glucose-Capillary: 338 mg/dL — ABNORMAL HIGH (ref 70–99)
Glucose-Capillary: 120 mg/dL — ABNORMAL HIGH (ref 70–99)

## 2014-06-20 LAB — POCT I-STAT 3, ART BLOOD GAS (G3+)
Acid-Base Excess: 1 mmol/L (ref 0.0–2.0)
Bicarbonate: 25.5 mEq/L — ABNORMAL HIGH (ref 20.0–24.0)
O2 Saturation: 97 %
PCO2 ART: 38.3 mmHg (ref 35.0–45.0)
Patient temperature: 98.6
TCO2: 27 mmol/L (ref 0–100)
pH, Arterial: 7.432 (ref 7.350–7.450)
pO2, Arterial: 86 mmHg (ref 80.0–100.0)

## 2014-06-20 LAB — BASIC METABOLIC PANEL
Anion gap: 14 (ref 5–15)
BUN: 17 mg/dL (ref 6–23)
CO2: 24 mEq/L (ref 19–32)
Calcium: 8.8 mg/dL (ref 8.4–10.5)
Chloride: 95 mEq/L — ABNORMAL LOW (ref 96–112)
Creatinine, Ser: 0.92 mg/dL (ref 0.50–1.35)
GFR calc Af Amer: >90 mL/min (ref >90)
GLUCOSE: 287 mg/dL — AB (ref 70–99)
Potassium: 4.4 mEq/L (ref 3.7–5.3)
Sodium: 133 mEq/L — ABNORMAL LOW (ref 137–147)

## 2014-06-20 LAB — CARBOXYHEMOGLOBIN
Carboxyhemoglobin: 1.5 % (ref 0.5–1.5)
Carboxyhemoglobin: 1.7 % — ABNORMAL HIGH (ref 0.5–1.5)
Methemoglobin: 0.8 % (ref 0.0–1.5)
Methemoglobin: 0.7 % (ref 0.0–1.5)
O2 Saturation: 58.6 %
O2 Saturation: 81.2 %
TOTAL HEMOGLOBIN: 14.9 g/dL (ref 13.5–18.0)
Total hemoglobin: 12.6 g/dL — ABNORMAL LOW (ref 13.5–18.0)

## 2014-06-20 MED ORDER — FUROSEMIDE 40 MG PO TABS: 40.0000 mg | ORAL_TABLET | Freq: Every day | ORAL | Status: DC

## 2014-06-20 MED ORDER — HYDRALAZINE HCL 25 MG PO TABS
12.5000 mg | ORAL_TABLET | Freq: Three times a day (TID) | ORAL | Status: DC
  Administered 2014-06-20 – 2014-06-23 (×7): 12.5 mg via ORAL
  Filled 2014-06-20 (×12): qty 0.5

## 2014-06-20 MED ORDER — INSULIN ASPART 100 UNIT/ML ~~LOC~~ SOLN
0.0000 [IU] | Freq: Every day | SUBCUTANEOUS | Status: DC
  Administered 2014-06-21: 3 [IU] via SUBCUTANEOUS

## 2014-06-20 MED ORDER — INSULIN ASPART 100 UNIT/ML ~~LOC~~ SOLN
0.0000 [IU] | Freq: Three times a day (TID) | SUBCUTANEOUS | Status: DC
  Administered 2014-06-20: 3 [IU] via SUBCUTANEOUS
  Administered 2014-06-20: 11 [IU] via SUBCUTANEOUS
  Administered 2014-06-21 – 2014-06-22 (×4): 3 [IU] via SUBCUTANEOUS
  Administered 2014-06-22: 5 [IU] via SUBCUTANEOUS

## 2014-06-20 MED ORDER — SPIRONOLACTONE 25 MG PO TABS
25.0000 mg | ORAL_TABLET | Freq: Every day | ORAL | Status: DC
  Administered 2014-06-20 – 2014-06-23 (×4): 25 mg via ORAL
  Filled 2014-06-20 (×4): qty 1

## 2014-06-20 MED ORDER — INSULIN GLARGINE 100 UNIT/ML ~~LOC~~ SOLN
45.0000 [IU] | Freq: Every day | SUBCUTANEOUS | Status: DC
  Administered 2014-06-20 – 2014-06-22 (×3): 45 [IU] via SUBCUTANEOUS
  Filled 2014-06-20 (×4): qty 0.45

## 2014-06-20 MED ORDER — HYDRALAZINE HCL 25 MG PO TABS
25.0000 mg | ORAL_TABLET | Freq: Three times a day (TID) | ORAL | Status: DC
  Filled 2014-06-20 (×3): qty 1

## 2014-06-20 MED ORDER — FUROSEMIDE 40 MG PO TABS
40.0000 mg | ORAL_TABLET | Freq: Two times a day (BID) | ORAL | Status: DC
  Administered 2014-06-21: 40 mg via ORAL
  Filled 2014-06-20 (×2): qty 1

## 2014-06-20 MED ORDER — FUROSEMIDE 10 MG/ML IJ SOLN
80.0000 mg | Freq: Once | INTRAMUSCULAR | Status: AC
  Administered 2014-06-20: 80 mg via INTRAVENOUS
  Filled 2014-06-20: qty 8

## 2014-06-20 NOTE — Progress Notes (Signed)
Mechanical Circulatory Support Note  Andre Tran is a 53 y.o. with  has a past medical history of Paroxysmal ventricular tachycardia; HYPERTENSION, UNSPECIFIED; HYPERLIPIDEMIA-MIXED; CVA; Nonischemic cardiomyopathy; CHF (congestive heart failure); Diabetes mellitus; Cancer; Depression; LV (left ventricular) mural thrombus; Small bowel obstruction; Jejunal intussusception; Thrombus; Splenic mass; Hypotension; Noncompliance with medication regimen; AICD (automatic cardioverter/defibrillator) present; Presence of permanent cardiac pacemaker; and Shortness of breath dyspnea.. Currently being evaluated for advanced therapies which include Left Ventricular Assist Device implantation.   Have had lengthy discussion with patient, wife, and twin daughters about the indications and alternatives to Heartmate II VAD placement. I have reviewed in full the following; caregiver role, life style changes, follow up care, discharge planning, rehabilitation, VAD dressing supplies and driveline management care. The patient was provided with educational resources to take home and read more about the device offered. The patient understands that from this discussion it does not mean that they will receive the device, but that depends on an extensive evaluation process. The patient is aware of the fact that if at anytime they want to stop the evaluation process they can.  VAD evaluation consent reviewed and signed by pt and wife. Initial VAD teaching completed with pt and caregivers. VAD teaching packet left at bedside for their review. Patient and caregivers asked questions and had good interaction with VAD coordinator; all questions answered regarding VAD implant,  hospital stay, and what to expect when discharged home. Pt identified wife as primary caregivers. Explained need for 24/7 care when pt discharged home;  both pt and caregiver verbalized understanding of above.   Explained that LVAD can be implanted for two  indications: 1. Bridge to transplant - used for patients who cannot safely wait for heart transplant.  2. Destination therapy - used for patients until end of life or recovery of heart function.  Patient and caregivers acknowledge understanding that pt would be getting LVAD as destination therapy (until end of life) because of past medical history including CVA with cognitive deficits.   Wife reports pt had colonoscopy within last two years at [facility] - she has records and will bring. She also reports pt had two previous abdominal surgeries for bowel obstruction, she will bring medical records for these as well.   Pt's primary MD is Dr. Orson Ape at [facility].   I have provided the patient with my contact information in order for them to call back if they have any questions.

## 2014-06-20 NOTE — Progress Notes (Signed)
[facility]  Patient Status: New pt for [facility] this admission    [facility] is providing the following services: HHRN and [facility] for home inotropes.  [facility] hospital team will follow pt and support in hospital teaching regarding [facility] HF program and home Milrinone program to support transition home when ordered.  If patient discharges after hours, please call [phone].   Larry Sierras 06/20/2014, 8:16 AM

## 2014-06-20 NOTE — Progress Notes (Signed)
Advanced Heart Failure Rounding Note   Subjective:    53 y.o. 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 He has h/o recurrent lymphoma (1993, 2007) treated with chemo (including adriamycin) - details unclear. 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   Admitted 12/9 from HF clinic with increased dyspnea and fatigue. Swan placed. CO-OX 42%. He was started on milrinone 0.25 mcg and diuresed with IV lasix. Beta blocker was held.   Stable overnight. On PO diuretics and weight stable, 24 hr I/O -2.2 liters. Denies any SOB, orthopnea or CP. Remains on milrinone 0.25 mcg. SBP 120-130s.   Swan #s CVP 4 PA 55/33 (43) CO/CI: 2.98 / 1.6 PCWP 24 SVR 2549  CO-OX 59% on Milrinone 0.25 mcg   Objective:   Weight Range:  Vital Signs:   Temp:  [96.8 F (36 C)-99 F (37.2 C)] 97.2 F (36.2 C) (12/11 0700) Pulse Rate:  [76-109] 98 (12/11 0700) Resp:  [13-24] 23 (12/11 0700) BP: (97-141)/(44-89) 128/88 mmHg (12/11 0700) SpO2:  [91 %-100 %] 100 % (12/11 0700) Weight:  [164 lb 14.5 oz (74.8 kg)] 164 lb 14.5 oz (74.8 kg) (12/11 0400) Last BM Date: 06/17/14  Weight change: Filed Weights   06/18/14 1158 06/19/14 0500 06/20/14 0400  Weight: 171 lb 15.3 oz (78 kg) 164 lb 10.9 oz (74.7 kg) 164 lb 14.5 oz (74.8 kg)    Intake/Output:   Intake/Output Summary (Last 24 hours) at 06/20/14 0748 Last data filed at 06/20/14 0700  Gross per 24 hour  Intake 1141.6 ml  Output   3375 ml  Net -2233.4 ml     Physical Exam: General:  Sitting up on side of bed HEENT: normal Neck: supple. RIJ swan  Carotids 2+ bilat; no bruits. No lymphadenopathy or thryomegaly appreciated. R swan  Cor: PMI laterally displaced. Regular rate & rhythm. Lungs: clear Abdomen: soft, nontender, nondistended. No hepatosplenomegaly. No bruits or  masses. Good bowel sounds. Extremities: no cyanosis, clubbing, rash, edema Neuro: alert & orientedx3, cranial nerves grossly intact. moves all 4 extremities w/o difficulty. Affect pleasant  Telemetry: NSR 70-80s   Labs: Basic Metabolic Panel:  Recent Labs Lab 06/18/14 1128 06/19/14 0430 06/20/14 0400  NA 134* 134* 133*  K 5.1 3.6* 4.4  CL 98 95* 95*  CO2 24 28 24   GLUCOSE 252* 230* 287*  BUN 17 18 17   CREATININE 0.88 0.98 0.92  CALCIUM 8.9 8.8 8.8  MG 1.8  --   --     Liver Function Tests:  Recent Labs Lab 06/18/14 1128  AST 24  ALT 61*  ALKPHOS 213*  BILITOT 2.4*  PROT 5.9*  ALBUMIN 3.2*   No results for input(s): LIPASE, AMYLASE in the last 168 hours. No results for input(s): AMMONIA in the last 168 hours.  CBC:  Recent Labs Lab 06/18/14 1128  WBC 4.2  NEUTROABS 2.0  HGB 13.6  HCT 40.2  MCV 93.9  PLT 206    Cardiac Enzymes:  Recent Labs Lab 06/18/14 1128 06/18/14 1650 06/19/14 0430  TROPONINI <0.30 <0.30 <0.30    BNP: BNP (last 3 results)  Recent Labs  06/20/13 0750 06/12/14 1415 06/18/14 1200  PROBNP 3235.0* 2079.0* 2144.0*     Other results:    Imaging: Dg Chest Port 1 View  06/19/2014   CLINICAL DATA:  Shortness of breath.  EXAM: PORTABLE CHEST - 1 VIEW  COMPARISON:  06/12/2014.  FINDINGS: Swan-Ganz catheter noted with tip in right pulmonary artery. Cardiac pacer in stable position. Mediastinum hilar structures are normal. Stable cardiomegaly. No focal infiltrate. Tiny left pleural effusion versus pleural scarring. No pneumothorax. No acute osseus abnormality.  IMPRESSION: 1. Swan-Ganz catheter noted with tip in right pulmonary artery. 2. Stable cardiomegaly.  Cardiac pacer in stable position. 3. Tiny left pleural effusion versus scarring.   Electronically Signed   By: Marcello Moores  Register   On: 06/19/2014 07:21     Medications:     Scheduled Medications: . atorvastatin  20 mg Oral Daily  . digoxin  0.125 mg Oral Daily  .  furosemide  40 mg Oral Daily  . insulin aspart  1-3 Units Subcutaneous TID WC  . insulin glargine  40 Units Subcutaneous QHS  . losartan  50 mg Oral Daily  . rivaroxaban  20 mg Oral Q supper  . sodium chloride  3 mL Intravenous Q12H  . spironolactone  12.5 mg Oral Daily    Infusions: . milrinone 0.25 mcg/kg/min (06/19/14 2000)    PRN Medications: sodium chloride, sodium chloride, acetaminophen, albuterol, benzonatate, loratadine, ondansetron (ZOFRAN) IV, sodium chloride   Assessment:   1. 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 H/O LV thrombus 3. H/O Non Hodgkins Lymphoma- He has h/o recurrent lymphoma (1993, 2007) treated with chemo (including adriamycin) -Last seen by Dr Alen Blew 02/2013. No evidence of recurrence at that time.  4. H/O Extensive thrombus in the left common femoral vein, left iliac  veins and IVC this was noted on CT of in 2014.  5. DM  6. Hypokalemia 7. Blood type O+  Plan/Discussion:    Initial CO-OX 42%. His co-ox has improved on milrinone, however CI remains marginal. Will increase milrinone to 0.375 mcg and recheck CO/CI and co-ox at 11:00 before discontinuing swan. Once swan out will get PICC for home milrinone.   His wedge remains elevated and his renal function has not changed. Will give 80 mg IV lasix today.   Continue to hold BB with cardiogenic shock. Will continue losartan 50 mg daily and increase spironolactone to 25 mg daily.   Back on Xarelto 20 mg daily. Had been off prior to admit and he was not aware.   Glucose uncontrolled, will increase sliding scale and lantus.   Very difficult situation. Concerned patient is moving towards advanced therapies. VAD Coordinator will come meet with patient today to discuss the basics of the LVAD and show him the pump. Will get consent signed to start work-up process. Concern is with his previous strokes I am not sure cognitively he will be able to manage a pump.  Will need to get wife involved to see if she will be able to capable of being able to assist in pump management if he does become eligible for VAD. He has VA benefits and Medicare. Will go ahead and order Venous dopplers and carotids.    Length of Stay: 2 Rande Brunt NP-C  06/20/2014, 7:48 AM  Advanced Heart Failure Team Pager [phone] (M-F; 7a - 4p)  Please contact [facility] for night-coverage after hours (4p -7a ) and weekends on [email or web address]   Patient seen and examined with Junie Bame, NP. We discussed all aspects of the encounter. I agree with the assessment and plan as stated above.   Feels better but cardiac output remains marginal even on milrinone. SVR is quite high. May  need to increase milrinone but given markedly elevated SVR will start with adding hydral/nitrates first. Would change back to po lasix.    Hold b-blocker. Continue losartan, spiro and digoxin. Start spiro 12.5. May need sildenafil.    Long talk about advanced therapies and he is interested in considering VAD. Will start the w/u. Will need PICC line placed with IR to go home on milrinone. There remains some question about his cognitive ability to handle VAD (has had previous CVA) and social support. However, I suspect these issues can be worked out.   The patient is critically ill with multiple organ systems failure and requires high complexity decision making for assessment and support, frequent evaluation and titration of therapies, application of advanced monitoring technologies and extensive interpretation of multiple databases.   Critical Care Time devoted to patient care services described in this note is 35 Minutes.  Amiah Frohlich,MD 10:57 AM

## 2014-06-20 NOTE — Progress Notes (Signed)
06/20/2014 1600  Pa Cath removed. Cortis left in place [place], [employer]

## 2014-06-21 DIAGNOSIS — Z01810 Encounter for preprocedural cardiovascular examination: Secondary | ICD-10-CM

## 2014-06-21 DIAGNOSIS — I5023 Acute on chronic systolic (congestive) heart failure: Secondary | ICD-10-CM

## 2014-06-21 LAB — CARBOXYHEMOGLOBIN
Carboxyhemoglobin: 1.7 % — ABNORMAL HIGH (ref 0.5–1.5)
Carboxyhemoglobin: 1.3 % (ref 0.5–1.5)
METHEMOGLOBIN: 0.9 % (ref 0.0–1.5)
Methemoglobin: 0.7 % (ref 0.0–1.5)
O2 Saturation: 89.8 %
O2 Saturation: 51.6 %
Total hemoglobin: 15.7 g/dL (ref 13.5–18.0)
Total hemoglobin: 16.5 g/dL (ref 13.5–18.0)

## 2014-06-21 LAB — BASIC METABOLIC PANEL
ANION GAP: 14 (ref 5–15)
BUN: 15 mg/dL (ref 6–23)
CO2: 25 mEq/L (ref 19–32)
CREATININE: 0.89 mg/dL (ref 0.50–1.35)
Calcium: 9.3 mg/dL (ref 8.4–10.5)
Chloride: 98 mEq/L (ref 96–112)
GFR calc non Af Amer: >90 mL/min (ref >90)
Glucose, Bld: 107 mg/dL — ABNORMAL HIGH (ref 70–99)
Potassium: 4 mEq/L (ref 3.7–5.3)
Sodium: 137 mEq/L (ref 137–147)

## 2014-06-21 LAB — GLUCOSE, CAPILLARY
GLUCOSE-CAPILLARY: 154 mg/dL — AB (ref 70–99)
GLUCOSE-CAPILLARY: 196 mg/dL — AB (ref 70–99)
Glucose-Capillary: 154 mg/dL — ABNORMAL HIGH (ref 70–99)
Glucose-Capillary: 254 mg/dL — ABNORMAL HIGH (ref 70–99)

## 2014-06-21 LAB — HEPATITIS C ANTIBODY: HCV Ab: NEGATIVE

## 2014-06-21 LAB — LACTATE DEHYDROGENASE: LDH: 375 U/L — ABNORMAL HIGH (ref 94–250)

## 2014-06-21 LAB — CBC
HEMATOCRIT: 44.4 % (ref 39.0–52.0)
HEMOGLOBIN: 15.1 g/dL (ref 13.0–17.0)
MCH: 30.6 pg (ref 26.0–34.0)
MCHC: 34 g/dL (ref 30.0–36.0)
MCV: 89.9 fL (ref 78.0–100.0)
Platelets: 171 10*3/uL (ref 150–400)
RBC: 4.94 MIL/uL (ref 4.22–5.81)
RDW: 13.5 % (ref 11.5–15.5)
WBC: 5 10*3/uL (ref 4.0–10.5)

## 2014-06-21 LAB — LIPID PANEL
Cholesterol: 106 mg/dL (ref 0–200)
HDL: 36 mg/dL — AB (ref >39)
LDL CALC: 58 mg/dL (ref 0–99)
Total CHOL/HDL Ratio: 2.9 RATIO
Triglycerides: 61 mg/dL (ref <150)
VLDL: 12 mg/dL (ref 0–40)

## 2014-06-21 LAB — TYPE AND SCREEN
ABO/RH(D): O POS
Antibody Screen: NEGATIVE

## 2014-06-21 LAB — HEPATITIS B CORE ANTIBODY, IGM: Hep B C IgM: NONREACTIVE

## 2014-06-21 LAB — PREALBUMIN: Prealbumin: 12.5 mg/dL — ABNORMAL LOW (ref 17.0–34.0)

## 2014-06-21 LAB — HEPATITIS B SURFACE ANTIBODY,QUALITATIVE: Hep B S Ab: NEGATIVE

## 2014-06-21 LAB — ANTITHROMBIN III: ANTITHROMB III FUNC: 85 % (ref 75–120)

## 2014-06-21 LAB — HEPATITIS B SURFACE ANTIGEN: Hepatitis B Surface Ag: NEGATIVE

## 2014-06-21 LAB — HIV ANTIBODY (ROUTINE TESTING W REFLEX): HIV 1&2 Ab, 4th Generation: NONREACTIVE

## 2014-06-21 LAB — URIC ACID: Uric Acid, Serum: 5.2 mg/dL (ref 4.0–7.8)

## 2014-06-21 MED ORDER — ISOSORBIDE MONONITRATE 15 MG HALF TABLET
15.0000 mg | ORAL_TABLET | Freq: Every day | ORAL | Status: DC
  Administered 2014-06-21 – 2014-06-22 (×2): 15 mg via ORAL
  Filled 2014-06-21 (×4): qty 1

## 2014-06-21 MED ORDER — SODIUM CHLORIDE 0.9 % IV BOLUS (SEPSIS)
500.0000 mL | Freq: Once | INTRAVENOUS | Status: AC
  Administered 2014-06-21: 500 mL via INTRAVENOUS

## 2014-06-21 MED ORDER — SODIUM CHLORIDE 0.9 % IJ SOLN: 10.0000 mL | INTRAMUSCULAR | Status: DC | PRN

## 2014-06-21 MED ORDER — FUROSEMIDE 40 MG PO TABS
40.0000 mg | ORAL_TABLET | Freq: Every day | ORAL | Status: DC
Start: 2014-06-22 — End: 2014-06-21

## 2014-06-21 MED ORDER — VANCOMYCIN HCL IN DEXTROSE 1-5 GM/200ML-% IV SOLN
1000.0000 mg | Freq: Once | INTRAVENOUS | Status: AC
  Administered 2014-06-21: 1000 mg via INTRAVENOUS
  Filled 2014-06-21: qty 200

## 2014-06-21 MED ORDER — DOCUSATE SODIUM 100 MG PO CAPS
100.0000 mg | ORAL_CAPSULE | Freq: Two times a day (BID) | ORAL | Status: DC | PRN
  Filled 2014-06-21: qty 1

## 2014-06-21 MED ORDER — SODIUM CHLORIDE 0.9 % IJ SOLN
10.0000 mL | Freq: Two times a day (BID) | INTRAMUSCULAR | Status: DC
  Administered 2014-06-23: 10 mL

## 2014-06-21 MED ORDER — MAGNESIUM SULFATE 2 GM/50ML IV SOLN
2.0000 g | Freq: Once | INTRAVENOUS | Status: AC
  Administered 2014-06-21: 2 g via INTRAVENOUS
  Filled 2014-06-21: qty 50

## 2014-06-21 MED ORDER — VANCOMYCIN HCL IN DEXTROSE 750-5 MG/150ML-% IV SOLN
750.0000 mg | Freq: Three times a day (TID) | INTRAVENOUS | Status: DC
  Administered 2014-06-22 – 2014-06-23 (×5): 750 mg via INTRAVENOUS
  Filled 2014-06-21 (×6): qty 150

## 2014-06-21 NOTE — Progress Notes (Addendum)
VASCULAR LAB PRELIMINARY  PRELIMINARY  PRELIMINARY  PRELIMINARY  Pre-op Cardiac Surgery  Carotid Findings:  Bilateral:  1-39% ICA stenosis.  Vertebral artery flow is antegrade.      Upper Extremity Right Left  Brachial Pressures Restricted T 84T  Radial Waveforms T T  Ulnar Waveforms T T  Palmar Arch (Allen's Test) WNL WNL   Findings:      Lower  Extremity Right Left  Dorsalis Pedis 101 B 83 T       Posterior Tibial 101 B 86 T  Ankle/Brachial Indices >1.0 >1.0    Findings:     CESTONE, HELENE, RVT 06/21/2014, 12:28 PM

## 2014-06-21 NOTE — Progress Notes (Signed)
VASCULAR LAB PRELIMINARY  PRELIMINARY  PRELIMINARY  PRELIMINARY  Bilateral lower extremity venous duplex  completed.    Preliminary report:  Bilateral:  No evidence of DVT, superficial thrombosis, or Baker's Cyst.  Incidental finding:  There appears to be an AV fistula in the right groin.  Miquela Costabile, RVT 06/21/2014, 12:28 PM

## 2014-06-21 NOTE — Consult Note (Signed)
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 Echocardiogram shows EF 10-15 percent with moderate TR, moderate-severe MR and moderate RV dysfunction. Patient had a previous PCI of the LAD years ago. His right coronary had no disease and was nondominant. He has had AICD placement and revision-no recent episodes of V. Tach. He is been followed and advanced heart failure clinic and has been fairly well compensated until after Thanksgiving when he went out of town to [place]. He is now on milrinone and heparin. He has history of a LV thrombus with stroke in 2008 resulting in right-sided weakness and some aphasia He has good family support from his wife and daughter. His creatinine has been adequate. CT scans of the abdomen chest and pelvis show no evidence recurrent lymphoma. He is followed by oncology in our cancer center and has had no evidence of recurrent lymphoma since 2012. He is a nonsmoker. He is been on Xarelto for his LV thrombus and stroke without bleeding complications. He denies any history of GI bleeding. He denies history of heavy alcohol intake.  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 He served in the [employer]  Current Activity/ Functional Status: Able to walk with assistance is not need a wheelchair but is weak on his right side from old  stroke   Zubrod Score: At the time of surgery this patient's most appropriate activity status/level should be described as: []     0    Normal activity, no symptoms []     1    Restricted in physical strenuous activity but ambulatory, able to do out light work []     2    Ambulatory and capable of self care, unable to do work activities, up and about                 more than 50%  Of the time                            [x]     3    Only limited self care, in bed greater than 50% of waking hours []     4    Completely disabled, no self care, confined to bed or chair []     5    Moribund  Past Medical History  Diagnosis Date  . Paroxysmal ventricular tachycardia     s/p AICD '05, replaced w/ St. Jude's in 2010 (PVT/V.Fib arrest requiring ICD implant in 2005)  . HYPERTENSION, UNSPECIFIED   . HYPERLIPIDEMIA-MIXED   . 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 Nonischemic cardiomyopathy     2/2 Adriamycin administration for lymphoma  . CHF (congestive heart  failure)     EF 15% by echo 2009; s/p St. Jude ICD  . Diabetes mellitus     Type 2  . Cancer      non hodkins lymphoma 1992, Hodgkins 2007  . Depression   . LV (left ventricular) mural thrombus     2009, on coumadin  . Small bowel obstruction     s/p Small bowel resection 2001  . Jejunal intussusception     2008  . 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 Splenic mass     Noted on Abd [place] 2009 w/ recs for f/u CT - not done  . Hypotension   . Noncompliance with medication regimen   . AICD (automatic cardioverter/defibrillator) present   . Presence of permanent cardiac pacemaker   . Shortness of breath dyspnea     Past Surgical History  Procedure Laterality Date  . Cardiac defibrillator placement      2005, replaced w/ St. Jude's 2010  . Vasectomy    . Bowel resection      small bowell  2/2 obstruction 2001  . Pacemaker insertion    . Insert / replace / remove pacemaker      History  Smoking status  . Never Smoker   Smokeless tobacco  . Never Used    History  Alcohol Use  . 1.2 oz/week  . 1 Cans of beer, 1 Shots of liquor per week    Comment: ON SPECIAL OCCASIONS    History   Social History  . Marital Status: Married    Spouse Name: N/A    Number of Children: N/A  . Years of Education: N/A   Occupational History  . Not on file.   Social History Main Topics  . Smoking status: Never Smoker   . Smokeless tobacco: Never Used  . Alcohol Use: 1.2 oz/week    1 Cans of beer, 1 Shots of liquor per week     Comment: ON SPECIAL OCCASIONS  . Drug Use: No  . Sexual Activity: No   Other Topics Concern  . Not on file   Social History Narrative    No Known Allergies  Current Facility-Administered Medications  Medication Dose Route Frequency Provider Last Rate Last Dose  . 0.9 %  sodium chloride infusion  250 mL Intravenous PRN Conrad Gilliam, NP 10 mL/hr at 06/19/14 2000 250 mL at 06/19/14 2000  . 0.9 %  sodium chloride infusion   Intravenous PRN Jolaine Artist, MD 10 mL/hr at 06/21/14 0926 250 mL at 06/21/14 0926  . acetaminophen (TYLENOL) tablet 650 mg  650 mg Oral Q4H PRN Amy D Clegg, NP   650 mg at 06/21/14 1432  . albuterol (PROVENTIL) (2.5 MG/3ML) 0.083% nebulizer solution 3 mL  3 mL Inhalation Q6H PRN Amy D Clegg, NP      . atorvastatin (LIPITOR) tablet 20 mg  20 mg Oral Daily Amy D Clegg, NP   20 mg at 06/21/14 0942  . benzonatate (TESSALON) capsule 100 mg  100 mg Oral TID PRN Amy D Clegg, NP      . digoxin (LANOXIN) tablet 0.125 mg  0.125 mg Oral Daily Amy D Clegg, NP   0.125 mg at 06/21/14 0942  . docusate sodium (COLACE) capsule 100 mg  100 mg Oral BID PRN Jolaine Artist, MD      . Derrill Memo ON 06/22/2014] furosemide (LASIX) tablet 40 mg  40 mg Oral Daily Gaspar Bidding  R Hess, DO      . hydrALAZINE (APRESOLINE) tablet 12.5 mg  12.5 mg Oral 3 times per  day Jolaine Artist, MD   12.5 mg at 06/21/14 1305  . insulin aspart (novoLOG) injection 0-15 Units  0-15 Units Subcutaneous TID WC Rande Brunt, NP   3 Units at 06/21/14 1231  . insulin aspart (novoLOG) injection 0-5 Units  0-5 Units Subcutaneous QHS Rande Brunt, NP   0 Units at 06/20/14 2207  . insulin glargine (LANTUS) injection 45 Units  45 Units Subcutaneous QHS Rande Brunt, NP   45 Units at 06/20/14 2207  . isosorbide mononitrate (IMDUR) 24 hr tablet 15 mg  15 mg Oral Daily Bryan R Hess, DO   15 mg at 06/21/14 1250  . loratadine (CLARITIN) tablet 10 mg  10 mg Oral Daily PRN Amy D Clegg, NP      . losartan (COZAAR) tablet 50 mg  50 mg Oral Daily Amy D Clegg, NP   50 mg at 06/20/14 0942  . milrinone (PRIMACOR) 20 MG/100ML (0.2 mg/mL) infusion  0.25 mcg/kg/min Intravenous Continuous Rande Brunt, NP 5.9 mL/hr at 06/21/14 0926 0.25 mcg/kg/min at 06/21/14 0926  . ondansetron (ZOFRAN) injection 4 mg  4 mg Intravenous Q6H PRN Amy D Clegg, NP      . rivaroxaban (XARELTO) tablet 20 mg  20 mg Oral Q supper Amy D Clegg, NP   20 mg at 06/20/14 1746  . sodium chloride 0.9 % injection 10-40 mL  10-40 mL Intracatheter Q12H Jolaine Artist, MD   10 mL at 06/21/14 1000  . sodium chloride 0.9 % injection 10-40 mL  10-40 mL Intracatheter PRN Jolaine Artist, MD      . sodium chloride 0.9 % injection 3 mL  3 mL Intravenous Q12H Amy D Clegg, NP   3 mL at 06/21/14 0942  . sodium chloride 0.9 % injection 3 mL  3 mL Intravenous PRN Amy D Clegg, NP      . spironolactone (ALDACTONE) tablet 25 mg  25 mg Oral Daily Rande Brunt, NP   25 mg at 06/21/14 9163    Prescriptions prior to admission  Medication Sig Dispense Refill Last Dose  . albuterol (PROVENTIL HFA;VENTOLIN HFA) 108 (90 BASE) MCG/ACT inhaler Inhale 1-2 puffs into the lungs every 6 (six) hours as needed for wheezing or shortness of breath.   unknown at Unknown time  . amoxicillin (AMOXIL) 500 MG capsule Take 500 mg by mouth 3 (three)  times daily. Patient has 14 pill in bottle unknown start date   unknown at Unknown time  . atorvastatin (LIPITOR) 40 MG tablet Take 40 mg by mouth daily.   unknown at Unknown time  . carvedilol (COREG) 6.25 MG tablet Take 3.125 mg by mouth 2 (two) times daily with a meal.    unknown at Unknown time  . digoxin (LANOXIN) 0.125 MG tablet Take 0.125 mg by mouth daily.   unknown at Unknown time  . insulin glargine (LANTUS) 100 UNIT/ML injection Inject 40 Units into the skin at bedtime.    unknown at unknown  . loratadine (CLARITIN) 10 MG tablet Take 10 mg by mouth daily.    unknown at Unknown time  . losartan (COZAAR) 100 MG tablet Take 50 mg by mouth daily.   06/18/2014 at Unknown time  . Nutritional Supplements (COLD AND FLU PO) Take 30 mLs by mouth daily as needed (for cold).   unknown at unknown    Family  History  Problem Relation Age of Onset  . Other      No known family h/o heart disease  . Heart disease    . Diabetes Mother   . Other Other     complications from hip replacement     Review of Systems:  He is right-hand dominant He has no active dental complaints He denies any previous thoracic trauma or pneumothorax He has a history of DVT No family history of cardiomyopathy    Cardiac Review of Systems: Y or N  Chest Pain [    ]  Resting SOB [place]er   ] Exertional SOB  [place]er  ]  Vertell Limber [place]er  ]   Pedal Edema [   ]    Palpitations [ yes ] Syncope  [  ]   Presyncope [   ]  General Review of Systems: [Y] = yes [  ]=no Constitional: recent weight change [ yes-increased ]; anorexia [place]er  ]; fatigue [  ]; nausea [  ]; night sweats [  ]; fever [  ]; or chills [  ]                                                               Dental: poor dentition[  ]; Last Dentist visit: 6 months  Eye : blurred vision [  ]; diplopia [   ]; vision changes [  ];  Amaurosis fugax[  ]; Resp: cough [  ];  wheezing[  ];  hemoptysis[  ]; shortness of breath[  ]; paroxysmal nocturnal dyspnea[  ]; dyspnea on  exertion[yes  ]; or orthopnea[yes  ];  GI:  gallstones[  ], vomiting[  ];  dysphagia[  ]; melena[  ];  hematochezia [  ]; heartburn[  ];   Hx of  Colonoscopy[  ]; GU: kidney stones [  ]; hematuria[  ];   dysuria [  ];  nocturia[  ];  history of     obstruction [  ]; urinary frequency [  ]             Skin: rash, swelling[  ];, hair loss[  ];  peripheral edema[  ];  or itching[  ]; Musculosketetal: myalgias[  ];  joint swelling[  ];  joint erythema[  ];  joint pain[  ];  back pain[  ];  Heme/Lymph: bruising[  ];  bleeding[  ];  anemia[  ];  Neuro: TIA[  ];  headaches[  ];  stroke[  ];  vertigo[  ];  seizures[  ];   paresthesias[  ];  difficulty walking[  ];  Psych:depression[  ]; anxiety[  ];  Endocrine: diabetes[yes  ];  thyroid dysfunction[  ];  Immunizations: Flu [  ]; Pneumococcal[  ];  Other:  Physical Exam: BP 99/67 mmHg  Pulse 108  Temp(Src) 98.4 F (36.9 C) (Oral)  Resp 24  Ht 5\' 7"  (1.702 m)  Wt 159 lb 9.8 oz (72.4 kg)  BMI 24.99 kg/m2  SpO2 96%  Gen. appearance-middle-aged male with sequela of old stroke breathing comfortably in the CCU bed on IV milrinone Lungs-clear HEENT-normocephalic dentition adequate no JVD no bruit COR-regular rhythm without murmur or gallop Abdomen-previous surgical scar, no palpable mass no pulsatile mass no tenderness Extremities-no edema or tenderness, some atrophic skin  changes of the distal legs Vascular-pulses present Neuro-right-sided weakness some expressive aphasia    Diagnostic Studies & Laboratory data:     Recent Radiology Findings:   Ct Abdomen Pelvis Wo Contrast  06/20/2014   CLINICAL DATA:  Acute superimposed upon chronic systolic heart failure (class 3), chemotherapy induced (Adriamycin), history of left ventricular mural thrombus, prior history of lymphoma. Patient being considered for left ventricular assist device, and CT requested to exclude a contraindication for such.  EXAM: CT CHEST, ABDOMEN AND PELVIS WITHOUT CONTRAST   TECHNIQUE: Multidetector CT imaging of the chest, abdomen and pelvis was performed following the standard protocol without IV contrast.  COMPARISON:  CT chest abdomen and pelvis with contrast 01/22/2013.  FINDINGS: CT CHEST FINDINGS  Minimal linear scarring in the left lower lobe. Lungs otherwise clear without evidence of interstitial pulmonary edema, confluent airspace consolidation, or parenchymal nodules or masses. No pleural effusions. Central airways patent without significant bronchial wall thickening.  Heart mildly enlarged. Left subclavian pacing defibrillator lead tips at the right atrial appendage and right ventricular apex. Small pericardial effusion. Calcification involving the left main coronary artery, LAD and right coronary artery. No visible atherosclerosis involving the thoracic aorta.  No significant mediastinal, hilar or axillary lymphadenopathy. Visualized thyroid gland unremarkable. Right jugular central venous catheter tip in the lower SVC.  Bone window images demonstrate sclerosis involving the T12 vertebral body.  CT ABDOMEN AND PELVIS FINDINGS  Normal unenhanced appearance of the liver, spleen, right adrenal glands, and kidneys. Focus of accessory splenic tissue inferior to the spleen. Moderate pancreatic atrophy. Stable approximate 1.9 x 1.1 cm nodule in the left adrenal gland. Gallbladder unremarkable. No biliary ductal dilation. Moderate aortoiliac atherosclerosis without aneurysm. No significant lymphadenopathy.  Stomach normal in appearance, filled with food. Normal appearing small bowel. Large stool burden in the cecum, ascending colon and transverse colon. Descending colon, sigmoid colon and rectum decompressed. Normal appendix in the right upper and mid pelvis. No ascites.  Urinary bladder unremarkable. Prostate gland and seminal vesicles normal for age. Pelvic phleboliths.  Bone window images demonstrate sclerosis involving the L1 and L2 vertebral bodies.  IMPRESSION: 1. No acute  cardiopulmonary disease. 2. Mild cardiomegaly. Small pericardial effusion. Mild to moderate atherosclerosis involving the left main coronary artery, LAD and right coronary artery. 3. No acute abnormalities involving the abdomen or pelvis. 4. Moderate aortoiliac atherosclerosis without aneurysm. 5. Stable left adrenal adenoma. 6. Sclerosis involving the T12, L1 and L2 vertebral bodies. This may represent sclerotic osseous metastatic disease. However, since it is in 3 contiguous vertebral bodies, it may be related to prior radiation therapy if there is a history of such.   Electronically Signed   By: Evangeline Dakin M.D.   On: 06/20/2014 20:51   Ct Chest Wo Contrast  06/20/2014   CLINICAL DATA:  Acute superimposed upon chronic systolic heart failure (class 3), chemotherapy induced (Adriamycin), history of left ventricular mural thrombus, prior history of lymphoma. Patient being considered for left ventricular assist device, and CT requested to exclude a contraindication for such.  EXAM: CT CHEST, ABDOMEN AND PELVIS WITHOUT CONTRAST  TECHNIQUE: Multidetector CT imaging of the chest, abdomen and pelvis was performed following the standard protocol without IV contrast.  COMPARISON:  CT chest abdomen and pelvis with contrast 01/22/2013.  FINDINGS: CT CHEST FINDINGS  Minimal linear scarring in the left lower lobe. Lungs otherwise clear without evidence of interstitial pulmonary edema, confluent airspace consolidation, or parenchymal nodules or masses. No pleural effusions. Central airways patent without significant bronchial  wall thickening.  Heart mildly enlarged. Left subclavian pacing defibrillator lead tips at the right atrial appendage and right ventricular apex. Small pericardial effusion. Calcification involving the left main coronary artery, LAD and right coronary artery. No visible atherosclerosis involving the thoracic aorta.  No significant mediastinal, hilar or axillary lymphadenopathy. Visualized thyroid  gland unremarkable. Right jugular central venous catheter tip in the lower SVC.  Bone window images demonstrate sclerosis involving the T12 vertebral body.  CT ABDOMEN AND PELVIS FINDINGS  Normal unenhanced appearance of the liver, spleen, right adrenal glands, and kidneys. Focus of accessory splenic tissue inferior to the spleen. Moderate pancreatic atrophy. Stable approximate 1.9 x 1.1 cm nodule in the left adrenal gland. Gallbladder unremarkable. No biliary ductal dilation. Moderate aortoiliac atherosclerosis without aneurysm. No significant lymphadenopathy.  Stomach normal in appearance, filled with food. Normal appearing small bowel. Large stool burden in the cecum, ascending colon and transverse colon. Descending colon, sigmoid colon and rectum decompressed. Normal appendix in the right upper and mid pelvis. No ascites.  Urinary bladder unremarkable. Prostate gland and seminal vesicles normal for age. Pelvic phleboliths.  Bone window images demonstrate sclerosis involving the L1 and L2 vertebral bodies.  IMPRESSION: 1. No acute cardiopulmonary disease. 2. Mild cardiomegaly. Small pericardial effusion. Mild to moderate atherosclerosis involving the left main coronary artery, LAD and right coronary artery. 3. No acute abnormalities involving the abdomen or pelvis. 4. Moderate aortoiliac atherosclerosis without aneurysm. 5. Stable left adrenal adenoma. 6. Sclerosis involving the T12, L1 and L2 vertebral bodies. This may represent sclerotic osseous metastatic disease. However, since it is in 3 contiguous vertebral bodies, it may be related to prior radiation therapy if there is a history of such.   Electronically Signed   By: Evangeline Dakin M.D.   On: 06/20/2014 20:51      Recent Lab Findings: Lab Results  Component Value Date   WBC 5.0 06/21/2014   HGB 15.1 06/21/2014   HCT 44.4 06/21/2014   PLT 171 06/21/2014   GLUCOSE 107* 06/21/2014   CHOL 106 06/21/2014   TRIG 61 06/21/2014   HDL 36*  06/21/2014   LDLCALC 58 06/21/2014   ALT 61* 06/18/2014   AST 24 06/18/2014   NA 137 06/21/2014   K 4.0 06/21/2014   CL 98 06/21/2014   CREATININE 0.89 06/21/2014   BUN 15 06/21/2014   CO2 25 06/21/2014   TSH 1.750 06/18/2014   INR 1.02 11/02/2012   HGBA1C 7.9* 06/18/2014      Assessment / Plan:    Nonischemic cardiomyopathy with class for CHF, admitted in cardiogenic shock now stabilized on IV milrinone  Moderate tricuspid regurgitation, moderate RV dysfunction  History DVT on chronic anticoagulation without history of GI bleeding  Previous stroke with significant right-sided deficit but able to function with strong support at home  Agree with plan to proceed with evaluation for implantable LVAD      @ME1 @ 06/21/2014 2:57 PM

## 2014-06-21 NOTE — Progress Notes (Signed)
Peripherally Inserted Central Catheter/Midline Placement  The IV Nurse has discussed with the patient and/or persons authorized to consent for the patient, the purpose of this procedure and the potential benefits and risks involved with this procedure.  The benefits include less needle sticks, lab draws from the catheter and patient may be discharged home with the catheter.  Risks include, but not limited to, infection, bleeding, blood clot (thrombus formation), and puncture of an artery; nerve damage and irregular heat beat.  Alternatives to this procedure were also discussed.  PICC/Midline Placement Documentation  PICC / Midline Double Lumen 06/21/14 PICC Right Brachial 36 cm 0 cm (Active)  Indication for Insertion or Continuance of Line Vasoactive infusions 06/21/2014  8:15 AM  Exposed Catheter (cm) 0 cm 06/21/2014  8:15 AM  Site Assessment Clean;Dry;Intact 06/21/2014  8:15 AM  Lumen #1 Status Flushed;Saline locked;Blood return noted 06/21/2014  8:15 AM  Lumen #2 Status Flushed;Saline locked;Blood return noted 06/21/2014  8:15 AM  Dressing Type Transparent 06/21/2014  8:15 AM  Dressing Status Clean;Dry;Intact;Antimicrobial disc in place 06/21/2014  8:15 AM  Line Care Connections checked and tightened 06/21/2014  8:15 AM  Line Adjustment (NICU/IV Team Only) No 06/21/2014  8:15 AM  Dressing Intervention New dressing 06/21/2014  8:15 AM  Dressing Change Due 06/28/14 06/21/2014  8:15 AM       Rolena Infante 06/21/2014, 8:16 AM

## 2014-06-21 NOTE — Progress Notes (Signed)
Advanced Heart Failure Rounding Note   Subjective:    53 y.o. 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 He has h/o recurrent lymphoma (1993, 2007) treated with chemo (including adriamycin) - details unclear. 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   Admitted 12/9 from HF clinic with increased dyspnea and fatigue. Swan placed. CO-OX 42%. He was started on milrinone 0.25 mcg and diuresed with IV lasix. Beta blocker was held.   Stable overnight. On PO diuretics and weight stable, 24 hr I/O -2.8 L. Wt down 5 lbs.  Denies any SOB, orthopnea or CP. Remains on milrinone 0.25 mcg.   Objective:   Weight Range:  Vital Signs:   Temp:  [97.7 F (36.5 C)-98.7 F (37.1 C)] 98.3 F (36.8 C) (12/12 0700) Pulse Rate:  [91-113] 113 (12/12 1116) Resp:  [15-29] 23 (12/12 1116) BP: (72-122)/(13-100) 87/61 mmHg (12/12 1116) SpO2:  [90 %-100 %] 97 % (12/12 1116) Weight:  [72.4 kg (159 lb 9.8 oz)] 72.4 kg (159 lb 9.8 oz) (12/12 0500) Last BM Date: 06/17/14  Weight change: Filed Weights   06/19/14 0500 06/20/14 0400 06/21/14 0500  Weight: 74.7 kg (164 lb 10.9 oz) 74.8 kg (164 lb 14.5 oz) 72.4 kg (159 lb 9.8 oz)    Intake/Output:   Intake/Output Summary (Last 24 hours) at 06/21/14 1120 Last data filed at 06/21/14 1100  Gross per 24 hour  Intake  726.7 ml  Output   2825 ml  Net -2098.3 ml     Physical Exam: General:  Sleeping in chair  HEENT: normal Neck: supple. Carotids 2+ bilat; no bruits. No lymphadenopathy or thryomegaly appreciated.  IJ 8-9 cm B/L  Cor: PMI laterally displaced. Regular rate & rhythm. Lungs: clear Abdomen: soft, nontender, nondistended. No hepatosplenomegaly. No bruits or masses. Good bowel sounds. Extremities: no cyanosis, clubbing, rash, edema Neuro: alert & orientedx3, cranial nerves  grossly intact. moves all 4 extremities w/o difficulty. Affect pleasant  Telemetry: NSR 70-80s   Labs: Basic Metabolic Panel:  Recent Labs Lab 06/18/14 1128 06/19/14 0430 06/20/14 0400 06/21/14 0525  NA 134* 134* 133* 137  K 5.1 3.6* 4.4 4.0  CL 98 95* 95* 98  CO2 $Re'24 28 24 25  'Vco$ GLUCOSE 252* 230* 287* 107*  BUN $Re'17 18 17 15  'axM$ CREATININE 0.88 0.98 0.92 0.89  CALCIUM 8.9 8.8 8.8 9.3  MG 1.8  --   --   --     Liver Function Tests:  Recent Labs Lab 06/18/14 1128  AST 24  ALT 61*  ALKPHOS 213*  BILITOT 2.4*  PROT 5.9*  ALBUMIN 3.2*   No results for input(s): LIPASE, AMYLASE in the last 168 hours. No results for input(s): AMMONIA in the last 168 hours.  CBC:  Recent Labs Lab 06/18/14 1128 06/21/14 0525  WBC 4.2 5.0  NEUTROABS 2.0  --   HGB 13.6 15.1  HCT 40.2 44.4  MCV 93.9 89.9  PLT 206 171    Cardiac Enzymes:  Recent Labs Lab 06/18/14 1128 06/18/14 1650 06/19/14 0430  TROPONINI <0.30 <0.30 <0.30    BNP: BNP (last 3 results)  Recent Labs  06/12/14 1415 06/18/14 1200  PROBNP 2079.0* 2144.0*     Other results:    Imaging: Ct Abdomen Pelvis Wo Contrast  06/20/2014   CLINICAL DATA:  Acute superimposed upon chronic systolic heart  failure (class 3), chemotherapy induced (Adriamycin), history of left ventricular mural thrombus, prior history of lymphoma. Patient being considered for left ventricular assist device, and CT requested to exclude a contraindication for such.  EXAM: CT CHEST, ABDOMEN AND PELVIS WITHOUT CONTRAST  TECHNIQUE: Multidetector CT imaging of the chest, abdomen and pelvis was performed following the standard protocol without IV contrast.  COMPARISON:  CT chest abdomen and pelvis with contrast 01/22/2013.  FINDINGS: CT CHEST FINDINGS  Minimal linear scarring in the left lower lobe. Lungs otherwise clear without evidence of interstitial pulmonary edema, confluent airspace consolidation, or parenchymal nodules or masses. No pleural  effusions. Central airways patent without significant bronchial wall thickening.  Heart mildly enlarged. Left subclavian pacing defibrillator lead tips at the right atrial appendage and right ventricular apex. Small pericardial effusion. Calcification involving the left main coronary artery, LAD and right coronary artery. No visible atherosclerosis involving the thoracic aorta.  No significant mediastinal, hilar or axillary lymphadenopathy. Visualized thyroid gland unremarkable. Right jugular central venous catheter tip in the lower SVC.  Bone window images demonstrate sclerosis involving the T12 vertebral body.  CT ABDOMEN AND PELVIS FINDINGS  Normal unenhanced appearance of the liver, spleen, right adrenal glands, and kidneys. Focus of accessory splenic tissue inferior to the spleen. Moderate pancreatic atrophy. Stable approximate 1.9 x 1.1 cm nodule in the left adrenal gland. Gallbladder unremarkable. No biliary ductal dilation. Moderate aortoiliac atherosclerosis without aneurysm. No significant lymphadenopathy.  Stomach normal in appearance, filled with food. Normal appearing small bowel. Large stool burden in the cecum, ascending colon and transverse colon. Descending colon, sigmoid colon and rectum decompressed. Normal appendix in the right upper and mid pelvis. No ascites.  Urinary bladder unremarkable. Prostate gland and seminal vesicles normal for age. Pelvic phleboliths.  Bone window images demonstrate sclerosis involving the L1 and L2 vertebral bodies.  IMPRESSION: 1. No acute cardiopulmonary disease. 2. Mild cardiomegaly. Small pericardial effusion. Mild to moderate atherosclerosis involving the left main coronary artery, LAD and right coronary artery. 3. No acute abnormalities involving the abdomen or pelvis. 4. Moderate aortoiliac atherosclerosis without aneurysm. 5. Stable left adrenal adenoma. 6. Sclerosis involving the T12, L1 and L2 vertebral bodies. This may represent sclerotic osseous metastatic  disease. However, since it is in 3 contiguous vertebral bodies, it may be related to prior radiation therapy if there is a history of such.   Electronically Signed   By: Evangeline Dakin M.D.   On: 06/20/2014 20:51   Ct Chest Wo Contrast  06/20/2014   CLINICAL DATA:  Acute superimposed upon chronic systolic heart failure (class 3), chemotherapy induced (Adriamycin), history of left ventricular mural thrombus, prior history of lymphoma. Patient being considered for left ventricular assist device, and CT requested to exclude a contraindication for such.  EXAM: CT CHEST, ABDOMEN AND PELVIS WITHOUT CONTRAST  TECHNIQUE: Multidetector CT imaging of the chest, abdomen and pelvis was performed following the standard protocol without IV contrast.  COMPARISON:  CT chest abdomen and pelvis with contrast 01/22/2013.  FINDINGS: CT CHEST FINDINGS  Minimal linear scarring in the left lower lobe. Lungs otherwise clear without evidence of interstitial pulmonary edema, confluent airspace consolidation, or parenchymal nodules or masses. No pleural effusions. Central airways patent without significant bronchial wall thickening.  Heart mildly enlarged. Left subclavian pacing defibrillator lead tips at the right atrial appendage and right ventricular apex. Small pericardial effusion. Calcification involving the left main coronary artery, LAD and right coronary artery. No visible atherosclerosis involving the thoracic aorta.  No significant mediastinal,  hilar or axillary lymphadenopathy. Visualized thyroid gland unremarkable. Right jugular central venous catheter tip in the lower SVC.  Bone window images demonstrate sclerosis involving the T12 vertebral body.  CT ABDOMEN AND PELVIS FINDINGS  Normal unenhanced appearance of the liver, spleen, right adrenal glands, and kidneys. Focus of accessory splenic tissue inferior to the spleen. Moderate pancreatic atrophy. Stable approximate 1.9 x 1.1 cm nodule in the left adrenal gland.  Gallbladder unremarkable. No biliary ductal dilation. Moderate aortoiliac atherosclerosis without aneurysm. No significant lymphadenopathy.  Stomach normal in appearance, filled with food. Normal appearing small bowel. Large stool burden in the cecum, ascending colon and transverse colon. Descending colon, sigmoid colon and rectum decompressed. Normal appendix in the right upper and mid pelvis. No ascites.  Urinary bladder unremarkable. Prostate gland and seminal vesicles normal for age. Pelvic phleboliths.  Bone window images demonstrate sclerosis involving the L1 and L2 vertebral bodies.  IMPRESSION: 1. No acute cardiopulmonary disease. 2. Mild cardiomegaly. Small pericardial effusion. Mild to moderate atherosclerosis involving the left main coronary artery, LAD and right coronary artery. 3. No acute abnormalities involving the abdomen or pelvis. 4. Moderate aortoiliac atherosclerosis without aneurysm. 5. Stable left adrenal adenoma. 6. Sclerosis involving the T12, L1 and L2 vertebral bodies. This may represent sclerotic osseous metastatic disease. However, since it is in 3 contiguous vertebral bodies, it may be related to prior radiation therapy if there is a history of such.   Electronically Signed   By: Hulan Saas M.D.   On: 06/20/2014 20:51     Medications:     Scheduled Medications: . atorvastatin  20 mg Oral Daily  . digoxin  0.125 mg Oral Daily  . furosemide  40 mg Oral BID  . hydrALAZINE  12.5 mg Oral 3 times per day  . insulin aspart  0-15 Units Subcutaneous TID WC  . insulin aspart  0-5 Units Subcutaneous QHS  . insulin glargine  45 Units Subcutaneous QHS  . losartan  50 mg Oral Daily  . rivaroxaban  20 mg Oral Q supper  . sodium chloride  10-40 mL Intracatheter Q12H  . sodium chloride  3 mL Intravenous Q12H  . spironolactone  25 mg Oral Daily    Infusions: . milrinone 0.25 mcg/kg/min (06/21/14 0926)    PRN Medications: sodium chloride, sodium chloride, acetaminophen,  albuterol, benzonatate, loratadine, ondansetron (ZOFRAN) IV, sodium chloride, sodium chloride   Assessment:   1. 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 H/O LV thrombus 3. H/O Non Hodgkins Lymphoma- He has h/o recurrent lymphoma (1993, 2007) treated with chemo (including adriamycin) -Last seen by Dr Clelia Croft 02/2013. No evidence of recurrence at that time.  4. H/O Extensive thrombus in the left common femoral vein, left iliac  veins and IVC this was noted on CT of in 2014.  5. DM  6. Hypokalemia 7. Blood type O+  Plan/Discussion:    Initial CO-OX 42%. His co-ox has improved on milrinone, however CI remains marginal. Swan out 12/11, milrinone decreased to 0.25 mcg/kg/min.  Co-Ox elevated to 89.8 on initial reading today, will repeat as suspect this is falsely elevated.   Continue to hold BB with cardiogenic shock. Will continue losartan 50 mg qd, Aldactone 25 mg qd, Dig.   Continue on Hydralazine for afterload reduction and start on Imdur 15 mg daily due to soft pressures  Back on Xarelto 20 mg daily. Had been off prior to admit and he was not aware.   Very difficult situation. Concerned  patient is moving towards advanced therapies. VAD Coordinator met w/ pt and wife yesterday for discussion.  Consent obtained and VAD approval process underway, hopefully within the next 3-4 weeks.  In the meantime, will send home on milrinone after PICC placed on 12/11. He has VA benefits and Medicare. Will go ahead and order Venous dopplers and carotids.    Length of Stay: 3 Kennith Maes  06/21/2014, 11:20 AM   Advanced Heart Failure Team Pager [phone] (M-F; 7a - 4p)  Please contact [facility] for night-coverage after hours (4p -7a ) and weekends on [email or web address]   Patient seen and examined with Dr. Awanda Mink. We discussed all aspects of the encounter. I agree with the assessment and plan as stated above.   He is doing well. Can transfer to SDU.  Continue milrinone. BP too soft to titrate HF meds further.  CVP 2. Hold lasix today. Cut back to 40 daily tomorrow. Has met with VAD team and is felt to be a good candidate. Likely home on Monday with VAD in January. Repeat co-ox.  Ignacia Gentzler,MD 12:07 PM

## 2014-06-21 NOTE — Progress Notes (Signed)
ANTIBIOTIC CONSULT NOTE - INITIAL  Pharmacy Consult:  Vancomycin Indication:  Bacteremia  No Known Allergies  Patient Measurements: Height: 5\' 7"  (170.2 cm) Weight: 159 lb 9.8 oz (72.4 kg) IBW/kg (Calculated) : 66.1  Vital Signs: Temp: 100.3 F (37.9 C) (12/12 1607) Temp Source: Oral (12/12 1607) BP: 72/38 mmHg (12/12 1632) Pulse Rate: 110 (12/12 1632) Intake/Output from previous day: 12/11 0701 - 12/12 0700 In: 822.1 [P.O.:340; I.V.:452.1] Out: 3625 [Urine:3625] Intake/Output from this shift: Total I/O In: 393.1 [P.O.:200; I.V.:143.1; IV Piggyback:50] Out: 800 [Urine:800]  Labs:  Recent Labs  06/19/14 0430 06/20/14 0400 06/21/14 0525  WBC  --   --  5.0  HGB  --   --  15.1  PLT  --   --  171  CREATININE 0.98 0.92 0.89   Estimated Creatinine Clearance: 91.8 mL/min (by C-G formula based on Cr of 0.89). No results for input(s): VANCOTROUGH, VANCOPEAK, VANCORANDOM, GENTTROUGH, GENTPEAK, GENTRANDOM, TOBRATROUGH, TOBRAPEAK, TOBRARND, AMIKACINPEAK, AMIKACINTROU, AMIKACIN in the last 72 hours.   Microbiology: Recent Results (from the past 720 hour(s))  MRSA PCR Screening     Status: None   Collection Time: 06/18/14 11:31 AM  Result Value Ref Range Status   MRSA by PCR NEGATIVE NEGATIVE Final    Comment:        The GeneXpert MRSA Assay (FDA approved for NASAL specimens only), is one component of a comprehensive MRSA colonization surveillance program. It is not intended to diagnose MRSA infection nor to guide or monitor treatment for MRSA infections.     Medical History: Past Medical History  Diagnosis Date  . Paroxysmal ventricular tachycardia     s/p AICD '05, replaced w/ St. Jude's in 2010 (PVT/V.Fib arrest requiring ICD implant in 2005)  . HYPERTENSION, UNSPECIFIED   . HYPERLIPIDEMIA-MIXED   . 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 Nonischemic  cardiomyopathy     2/2 Adriamycin administration for lymphoma  . CHF (congestive heart failure)     EF 15% by echo 2009; s/p St. Jude ICD  . Diabetes mellitus     Type 2  . Cancer      non hodkins lymphoma 1992, Hodgkins 2007  . Depression   . LV (left ventricular) mural thrombus     2009, on coumadin  . Small bowel obstruction     s/p Small bowel resection 2001  . Jejunal intussusception     2008  . 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 Splenic mass     Noted on Abd [place] 2009 w/ recs for f/u CT - not done  . Hypotension   . Noncompliance with medication regimen   . AICD (automatic cardioverter/defibrillator) present   . Presence of permanent cardiac pacemaker   . Shortness of breath dyspnea      Assessment: 39 YOM with possible bacteremia to start vancomycin.  Patient's renal function has been stable.  12/9 BCx x2 -   Goal of Therapy:  Vancomycin trough level 15-20 mcg/ml   Plan:  - Vanc 1gm IV x 1, then 750mg  IV Q8H - Monitor renal fxn, micro data, vanc trough at Css - Consider the addition of Gram negative coverage if patient doesn't improve clinically   Samay Delcarlo D. Mina Marble, PharmD, BCPS Pager:  [phone] 06/21/2014, 4:59 PM

## 2014-06-22 DIAGNOSIS — Z01810 Encounter for preprocedural cardiovascular examination: Secondary | ICD-10-CM

## 2014-06-22 DIAGNOSIS — R509 Fever, unspecified: Secondary | ICD-10-CM

## 2014-06-22 LAB — BASIC METABOLIC PANEL
Anion gap: 9 (ref 5–15)
BUN: 14 mg/dL (ref 6–23)
CHLORIDE: 98 meq/L (ref 96–112)
CO2: 26 meq/L (ref 19–32)
CREATININE: 0.99 mg/dL (ref 0.50–1.35)
Calcium: 8.3 mg/dL — ABNORMAL LOW (ref 8.4–10.5)
GFR calc Af Amer: >90 mL/min (ref >90)
GFR calc non Af Amer: >90 mL/min (ref >90)
GLUCOSE: 81 mg/dL (ref 70–99)
Potassium: 4.1 mEq/L (ref 3.7–5.3)
Sodium: 133 mEq/L — ABNORMAL LOW (ref 137–147)

## 2014-06-22 LAB — CBC
HCT: 43.7 % (ref 39.0–52.0)
HEMOGLOBIN: 14.7 g/dL (ref 13.0–17.0)
MCH: 30.4 pg (ref 26.0–34.0)
MCHC: 33.6 g/dL (ref 30.0–36.0)
MCV: 90.5 fL (ref 78.0–100.0)
Platelets: 166 10*3/uL (ref 150–400)
RBC: 4.83 MIL/uL (ref 4.22–5.81)
RDW: 13.7 % (ref 11.5–15.5)
WBC: 5.2 10*3/uL (ref 4.0–10.5)

## 2014-06-22 LAB — CARBOXYHEMOGLOBIN
Carboxyhemoglobin: 1.5 % (ref 0.5–1.5)
Methemoglobin: 0.8 % (ref 0.0–1.5)
O2 Saturation: 71.8 %
Total hemoglobin: 15.1 g/dL (ref 13.5–18.0)

## 2014-06-22 LAB — GLUCOSE, CAPILLARY
GLUCOSE-CAPILLARY: 99 mg/dL (ref 70–99)
GLUCOSE-CAPILLARY: 154 mg/dL — AB (ref 70–99)
GLUCOSE-CAPILLARY: 231 mg/dL — AB (ref 70–99)
Glucose-Capillary: 150 mg/dL — ABNORMAL HIGH (ref 70–99)

## 2014-06-22 NOTE — Discharge Summary (Signed)
Advanced Heart Failure Team  Discharge Summary   Patient ID: Andre Tran MRN: [id number], DOB/AGE: Feb 14, 1961 53 y.o. Admit date: 06/18/2014 D/C date:     06/23/2014   Primary Discharge Diagnoses:  1. A/C Systolic Heart Failure/Cardiogenic shock - NICM with LVEF 15% thought to be from adriamycin 2. H/O LV thrombus 3. H/O Non Hodgkins Lymphoma- He has h/o recurrent lymphoma (1993, 2007) treated with chemo (including adriamycin) -Last seen by Dr Alen Blew 02/2013. No evidence of recurrence at that time.  4. H/O Extensive thrombus in the left common femoral vein, left iliac  veins and IVC this was noted on CT of in 2014.  5. DM  6. Hypokalemia 7. Blood type O+   Hospital Course:   53 y.o. 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 He has h/o recurrent lymphoma (1993, 2007) treated with chemo (including adriamycin) - details unclear. 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   Admitted 06/18/14  from HF clinic with increased dyspnea and fatigue. A swan ganz cathter was placed due to concern for low out put. Initial hemodynamics included:  RA 15 RV 58/14 PAP 57/35 (44) PCWP 24 Thermo CO/CI 2.8/1.5 SVR 2317 PVR 7.0  CO-OX was 42%,    Which was consistent with cardiogenic shock. Milrinone was started at 0.25 mcg and titrated up to 0.375. He was diuresed with IV lasix.. As he improved Milrinone was cut back to 0.25 mcg but unable to be weaned fully due to low output. His swan was removed once he had a PICC placed. He will remain off beta blocker due to low output.   He did have fever and there was concern about an infection from swan ganz catheter. WBC was not elevated. He received short term Vancomycin. BCX negative x 2.   Dr Darcey Nora, [place] surgeon consulted due to the potential need for advanced  therapies. Zada Girt also met with him and his wife regarding possible LVAD and they have signed consent for VAD evaluation.   He will continue to be followed closley in the HF clinic and has follow up next week. [facility] will follow for home milrinone and weekly lab work.     Discharge Weight Range: Discharge Weight 159 pounds.  Discharge Vitals: Blood pressure 101/62, pulse 95, temperature 98.5 F (36.9 C), temperature source Oral, resp. rate 22, height _0  (1.702 m), weight 160 lb 4.4 oz (72.7 kg), SpO2 98 %.  Labs: Lab Results  Component Value Date   WBC 5.2 06/22/2014   HGB 14.7 06/22/2014   HCT 43.7 06/22/2014   MCV 90.5 06/22/2014   PLT 166 06/22/2014    Recent Labs Lab 06/18/14 1128  06/22/14 0830  NA 134*  < > 133*  K 5.1  < > 4.1  CL 98  < > 98  CO2 24  < > 26  BUN 17  < > 14  CREATININE 0.88  < > 0.99  CALCIUM 8.9  < > 8.3*  PROT 5.9*  --   --   BILITOT 2.4*  --   --   ALKPHOS 213*  --   --   ALT 61*  --   --   AST 24  --   --   GLUCOSE 252*  < > 81  < > = values in this interval not displayed. Lab  Results  Component Value Date   CHOL 106 06/21/2014   HDL 36* 06/21/2014   LDLCALC 58 06/21/2014   TRIG 61 06/21/2014   BNP (last 3 results)  Recent Labs  06/12/14 1415 06/18/14 1200  PROBNP 2079.0* 2144.0*    Diagnostic Studies/Procedures   No results found.  Discharge Medications     Medication List    STOP taking these medications        amoxicillin 500 MG capsule  Commonly known as:  AMOXIL     benzonatate 100 MG capsule  Commonly known as:  TESSALON     carvedilol 6.25 MG tablet  Commonly known as:  COREG     COLD AND FLU PO      TAKE these medications        albuterol 108 (90 BASE) MCG/ACT inhaler  Commonly known as:  PROVENTIL HFA;VENTOLIN HFA  Inhale 1-2 puffs into the lungs every 6 (six) hours as needed for wheezing or shortness of breath.     atorvastatin 40 MG tablet  Commonly known as:  LIPITOR  Take 0.5 tablets (20 mg  total) by mouth daily.     digoxin 0.125 MG tablet  Commonly known as:  LANOXIN  Take 0.125 mg by mouth daily.     hydrALAZINE 25 MG tablet  Commonly known as:  APRESOLINE  Take 0.5 tablets (12.5 mg total) by mouth every 8 (eight) hours.     insulin glargine 100 UNIT/ML injection  Commonly known as:  LANTUS  Inject 0.45 mLs (45 Units total) into the skin at bedtime.     isosorbide mononitrate 15 mg Tb24 24 hr tablet  Commonly known as:  IMDUR  Take 0.5 tablets (15 mg total) by mouth daily.     loratadine 10 MG tablet  Commonly known as:  CLARITIN  Take 10 mg by mouth daily.     losartan 100 MG tablet  Commonly known as:  COZAAR  Take 50 mg by mouth daily.     milrinone 20 MG/100ML Soln infusion  Commonly known as:  PRIMACOR  Inject 19.5 mcg/min into the vein continuous.     rivaroxaban 20 MG Tabs tablet  Commonly known as:  XARELTO  Take 1 tablet (20 mg total) by mouth daily with supper.     spironolactone 25 MG tablet  Commonly known as:  ALDACTONE  Take 1 tablet (25 mg total) by mouth daily.        Disposition   The patient will be discharged in stable condition to home. Discharge Instructions    ACE Inhibitor / ARB already ordered    Complete by:  As directed      Diet - low sodium heart healthy    Complete by:  As directed      Heart Failure patients record your daily weight using the same scale at the same time of day    Complete by:  As directed      Increase activity slowly    Complete by:  As directed           Follow-up Information    Follow up with Glori Bickers, MD On 06/30/2014.   Specialty:  Cardiology   Why:  at 2:45 [place] information:   [facility] [place] [address] [phone]       Follow up with [facility].   Why:  Home Health RN   Contact information:   [phone]  [address] [place] [address] [phone]         Duration of Discharge Encounter:  Greater than 35 minutes   Signed, CLEGG,AMY NP-C  06/23/2014, 1:07 PM   Patient seen and examined with Darrick Grinder, NP. We discussed all aspects of the encounter. I agree with the assessment and plan as stated above. He is ready for d/c. Home milrinone has been arranged and VAD work-up is in progress.   Worthington Cruzan,MD 12:29 AM

## 2014-06-22 NOTE — Evaluation (Signed)
Physical Therapy Evaluation and Discharge Patient Details Name: Andre Tran MRN: [id number] DOB: 10/06/1960 Today's Date: 06/22/2014   History of Present Illness  Pt is a 53 y.o. 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 He has h/o recurrent lymphoma (1993, 2007) treated with chemo (including adriamycin) - details unclear. 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 He has been followed closely in the HF clinic for possible advanced therapies. He was last seen and at that he was doing well with minimal dyspnea. Day of admission he presented in the HF clinic with increased dyspnea over the last few weeks. He presents to the [facility] with increased fatigue and dyspnea. Can walk a few steps then has to stop. Sleeping on 6 pillows. Poor appetite.   Clinical Impression  Patient evaluated by Physical Therapy with no further acute PT needs identified. All education has been completed and the patient has no further questions. At the time of PT eval pt was independent with transfers and mod I for ambulation. Was able to complete 800 feet without report of SOB or fatigue - therapist ended gait training however pt states he would like to go further. Noted that during gait training HR increased into the 140's and RR increased to 48 at times - mainly when pt was talking and not focusing on his breathing. VC's to correct. Noted that pt is a candidate for LVAD. See below for any follow-up Physial Therapy or equipment needs. PT is signing off as pt has no current needs, however please re-consult after LVAD if needed. Thank you for this referral.     Follow Up Recommendations No PT follow up    Equipment Recommendations  None recommended by PT    Recommendations for Other Services       Precautions / Restrictions Restrictions Weight Bearing  Restrictions: No      Mobility  Bed Mobility               General bed mobility comments: Pt sitting up in chair when PT arrived.   Transfers Overall transfer level: Independent Equipment used: None                Ambulation/Gait Ambulation/Gait assistance: Modified independent (Device/Increase time) Ambulation Distance (Feet): 800 Feet Assistive device: None (Pushed IV pole) Gait Pattern/deviations: Decreased step length - right;Step-through pattern Gait velocity: Decreased Gait velocity interpretation: Below normal speed for age/gender General Gait Details: Pt with slight gait disturbance from residual R sided weakness, however pt does not require assist, and no unsteadiness or LOB was noted. Pt was cued for pursed-lip breathing throughout gait training for HR and RR control. At times HR increased into the 140's and RR increased as high as 48 with casual ambulation. During these times pt was cued to stop and focus on his breathing, as he was talking almost constantly throughout gait training. No SOB noted or reported.   Stairs            Wheelchair Mobility    Modified Rankin (Stroke Patients Only)       Balance Overall balance assessment: No apparent balance deficits (not formally assessed)  Pertinent Vitals/Pain Pain Assessment: No/denies pain    Home Living Family/patient expects to be discharged to:: Private residence Living Arrangements: Spouse/significant other Available Help at Discharge: Family;Available 24 hours/day                  Prior Function Level of Independence: Independent         Comments: Unable to work or drive since CVA      Hand Dominance        Extremity/Trunk Assessment   Upper Extremity Assessment: RUE deficits/detail RUE Deficits / Details: Residual weakness consistent with prior CVA.          Lower Extremity Assessment: RLE  deficits/detail RLE Deficits / Details: Residual weakness consistent with prior CVA    Cervical / Trunk Assessment: Normal  Communication   Communication: Expressive difficulties  Cognition Arousal/Alertness: Awake/alert Behavior During Therapy: WFL for tasks assessed/performed Overall Cognitive Status: No family/caregiver present to determine baseline cognitive functioning       Memory: Decreased short-term memory              General Comments      Exercises        Assessment/Plan    PT Assessment Patent does not need any further PT services  PT Diagnosis Abnormality of gait   PT Problem List    PT Treatment Interventions     PT Goals (Current goals can be found in the Care Plan section) Acute Rehab PT Goals PT Goal Formulation: All assessment and education complete, DC therapy    Frequency     Barriers to discharge        Co-evaluation               End of Session Equipment Utilized During Treatment: Gait belt Activity Tolerance: Patient tolerated treatment well Patient left: in chair;with call bell/phone within reach Nurse Communication: Mobility status         Time: 5784-6962 PT Time Calculation (min) (ACUTE ONLY): 28 min   Charges:   PT Evaluation $Initial PT Evaluation Tier I: 1 Procedure PT Treatments $Gait Training: 23-37 mins   PT G Codes:          Rolinda Roan 06/22/2014, 9:37 AM   Rolinda Roan, PT, DPT Acute Rehabilitation Services Pager: [phone]

## 2014-06-22 NOTE — Progress Notes (Signed)
Advanced Heart Failure Rounding Note   Subjective:    53 y.o. 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 He has h/o recurrent lymphoma (1993, 2007) treated with chemo (including adriamycin) - details unclear. 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   Admitted 12/9 from HF clinic with increased dyspnea and fatigue. Swan placed. CO-OX 42%. He was started on milrinone 0.25 mcg and diuresed with IV lasix. Beta blocker was held.   Yesterday became hypotensive and febrile. Got 500cc NS. And vanc started. [place] drawn. Afebrile this am. Ambulating. Co-ox 72%. BP stable. CVP 2.    Objective:   Weight Range:  Vital Signs:   Temp:  [98.4 F (36.9 C)-100.3 F (37.9 C)] 98.7 F (37.1 C) (12/13 0800) Pulse Rate:  [90-119] 101 (12/13 0800) Resp:  [0-33] 15 (12/13 0800) BP: (66-147)/(38-97) 105/64 mmHg (12/13 0800) SpO2:  [93 %-100 %] 96 % (12/13 0800) Last BM Date: 06/17/14  Weight change: Filed Weights   06/19/14 0500 06/20/14 0400 06/21/14 0500  Weight: 74.7 kg (164 lb 10.9 oz) 74.8 kg (164 lb 14.5 oz) 72.4 kg (159 lb 9.8 oz)    Intake/Output:   Intake/Output Summary (Last 24 hours) at 06/22/14 0959 Last data filed at 06/22/14 0800  Gross per 24 hour  Intake 1721.6 ml  Output    950 ml  Net  771.6 ml     Physical Exam: General:  Walking in hall HEENT: normal Neck: supple. Carotids 2+ bilat; no bruits. No lymphadenopathy or thryomegaly appreciated.  IJ 8-9 cm B/L  Cor: PMI laterally displaced. Regular rate & rhythm. Lungs: clear Abdomen: soft, nontender, nondistended. No hepatosplenomegaly. No bruits or masses. Good bowel sounds. Extremities: no cyanosis, clubbing, rash, edema Neuro: alert & orientedx3, cranial nerves grossly intact. moves all 4 extremities w/o difficulty. Affect  pleasant  Telemetry: SR ~100s  Labs: Basic Metabolic Panel:  Recent Labs Lab 06/18/14 1128 06/19/14 0430 06/20/14 0400 06/21/14 0525 06/22/14 0830  NA 134* 134* 133* 137 133*  K 5.1 3.6* 4.4 4.0 4.1  CL 98 95* 95* 98 98  CO2 24 28 24 25 26   GLUCOSE 252* 230* 287* 107* 81  BUN 17 18 17 15 14   CREATININE 0.88 0.98 0.92 0.89 0.99  CALCIUM 8.9 8.8 8.8 9.3 8.3*  MG 1.8  --   --   --   --     Liver Function Tests:  Recent Labs Lab 06/18/14 1128  AST 24  ALT 61*  ALKPHOS 213*  BILITOT 2.4*  PROT 5.9*  ALBUMIN 3.2*   No results for input(s): LIPASE, AMYLASE in the last 168 hours. No results for input(s): AMMONIA in the last 168 hours.  CBC:  Recent Labs Lab 06/18/14 1128 06/21/14 0525 06/22/14 0830  WBC 4.2 5.0 5.2  NEUTROABS 2.0  --   --   HGB 13.6 15.1 14.7  HCT 40.2 44.4 43.7  MCV 93.9 89.9 90.5  PLT 206 171 166    Cardiac Enzymes:  Recent Labs Lab 06/18/14 1128 06/18/14 1650 06/19/14 0430  TROPONINI <0.30 <0.30 <0.30    BNP: BNP (last 3 results)  Recent Labs  06/12/14 1415 06/18/14 1200  PROBNP 2079.0* 2144.0*     Other results:    Imaging: Ct Abdomen Pelvis Wo Contrast  06/20/2014   CLINICAL DATA:  Acute superimposed upon chronic systolic  heart failure (class 3), chemotherapy induced (Adriamycin), history of left ventricular mural thrombus, prior history of lymphoma. Patient being considered for left ventricular assist device, and CT requested to exclude a contraindication for such.  EXAM: CT CHEST, ABDOMEN AND PELVIS WITHOUT CONTRAST  TECHNIQUE: Multidetector CT imaging of the chest, abdomen and pelvis was performed following the standard protocol without IV contrast.  COMPARISON:  CT chest abdomen and pelvis with contrast 01/22/2013.  FINDINGS: CT CHEST FINDINGS  Minimal linear scarring in the left lower lobe. Lungs otherwise clear without evidence of interstitial pulmonary edema, confluent airspace consolidation, or parenchymal  nodules or masses. No pleural effusions. Central airways patent without significant bronchial wall thickening.  Heart mildly enlarged. Left subclavian pacing defibrillator lead tips at the right atrial appendage and right ventricular apex. Small pericardial effusion. Calcification involving the left main coronary artery, LAD and right coronary artery. No visible atherosclerosis involving the thoracic aorta.  No significant mediastinal, hilar or axillary lymphadenopathy. Visualized thyroid gland unremarkable. Right jugular central venous catheter tip in the lower SVC.  Bone window images demonstrate sclerosis involving the T12 vertebral body.  CT ABDOMEN AND PELVIS FINDINGS  Normal unenhanced appearance of the liver, spleen, right adrenal glands, and kidneys. Focus of accessory splenic tissue inferior to the spleen. Moderate pancreatic atrophy. Stable approximate 1.9 x 1.1 cm nodule in the left adrenal gland. Gallbladder unremarkable. No biliary ductal dilation. Moderate aortoiliac atherosclerosis without aneurysm. No significant lymphadenopathy.  Stomach normal in appearance, filled with food. Normal appearing small bowel. Large stool burden in the cecum, ascending colon and transverse colon. Descending colon, sigmoid colon and rectum decompressed. Normal appendix in the right upper and mid pelvis. No ascites.  Urinary bladder unremarkable. Prostate gland and seminal vesicles normal for age. Pelvic phleboliths.  Bone window images demonstrate sclerosis involving the L1 and L2 vertebral bodies.  IMPRESSION: 1. No acute cardiopulmonary disease. 2. Mild cardiomegaly. Small pericardial effusion. Mild to moderate atherosclerosis involving the left main coronary artery, LAD and right coronary artery. 3. No acute abnormalities involving the abdomen or pelvis. 4. Moderate aortoiliac atherosclerosis without aneurysm. 5. Stable left adrenal adenoma. 6. Sclerosis involving the T12, L1 and L2 vertebral bodies. This may  represent sclerotic osseous metastatic disease. However, since it is in 3 contiguous vertebral bodies, it may be related to prior radiation therapy if there is a history of such.   Electronically Signed   By: Evangeline Dakin M.D.   On: 06/20/2014 20:51   Ct Chest Wo Contrast  06/20/2014   CLINICAL DATA:  Acute superimposed upon chronic systolic heart failure (class 3), chemotherapy induced (Adriamycin), history of left ventricular mural thrombus, prior history of lymphoma. Patient being considered for left ventricular assist device, and CT requested to exclude a contraindication for such.  EXAM: CT CHEST, ABDOMEN AND PELVIS WITHOUT CONTRAST  TECHNIQUE: Multidetector CT imaging of the chest, abdomen and pelvis was performed following the standard protocol without IV contrast.  COMPARISON:  CT chest abdomen and pelvis with contrast 01/22/2013.  FINDINGS: CT CHEST FINDINGS  Minimal linear scarring in the left lower lobe. Lungs otherwise clear without evidence of interstitial pulmonary edema, confluent airspace consolidation, or parenchymal nodules or masses. No pleural effusions. Central airways patent without significant bronchial wall thickening.  Heart mildly enlarged. Left subclavian pacing defibrillator lead tips at the right atrial appendage and right ventricular apex. Small pericardial effusion. Calcification involving the left main coronary artery, LAD and right coronary artery. No visible atherosclerosis involving the thoracic aorta.  No significant  mediastinal, hilar or axillary lymphadenopathy. Visualized thyroid gland unremarkable. Right jugular central venous catheter tip in the lower SVC.  Bone window images demonstrate sclerosis involving the T12 vertebral body.  CT ABDOMEN AND PELVIS FINDINGS  Normal unenhanced appearance of the liver, spleen, right adrenal glands, and kidneys. Focus of accessory splenic tissue inferior to the spleen. Moderate pancreatic atrophy. Stable approximate 1.9 x 1.1 cm  nodule in the left adrenal gland. Gallbladder unremarkable. No biliary ductal dilation. Moderate aortoiliac atherosclerosis without aneurysm. No significant lymphadenopathy.  Stomach normal in appearance, filled with food. Normal appearing small bowel. Large stool burden in the cecum, ascending colon and transverse colon. Descending colon, sigmoid colon and rectum decompressed. Normal appendix in the right upper and mid pelvis. No ascites.  Urinary bladder unremarkable. Prostate gland and seminal vesicles normal for age. Pelvic phleboliths.  Bone window images demonstrate sclerosis involving the L1 and L2 vertebral bodies.  IMPRESSION: 1. No acute cardiopulmonary disease. 2. Mild cardiomegaly. Small pericardial effusion. Mild to moderate atherosclerosis involving the left main coronary artery, LAD and right coronary artery. 3. No acute abnormalities involving the abdomen or pelvis. 4. Moderate aortoiliac atherosclerosis without aneurysm. 5. Stable left adrenal adenoma. 6. Sclerosis involving the T12, L1 and L2 vertebral bodies. This may represent sclerotic osseous metastatic disease. However, since it is in 3 contiguous vertebral bodies, it may be related to prior radiation therapy if there is a history of such.   Electronically Signed   By: Evangeline Dakin M.D.   On: 06/20/2014 20:51     Medications:     Scheduled Medications: . atorvastatin  20 mg Oral Daily  . digoxin  0.125 mg Oral Daily  . hydrALAZINE  12.5 mg Oral 3 times per day  . insulin aspart  0-15 Units Subcutaneous TID WC  . insulin aspart  0-5 Units Subcutaneous QHS  . insulin glargine  45 Units Subcutaneous QHS  . isosorbide mononitrate  15 mg Oral Daily  . losartan  50 mg Oral Daily  . rivaroxaban  20 mg Oral Q supper  . sodium chloride  10-40 mL Intracatheter Q12H  . sodium chloride  3 mL Intravenous Q12H  . spironolactone  25 mg Oral Daily  . vancomycin  750 mg Intravenous Q8H    Infusions: . milrinone 0.25 mcg/kg/min  (06/22/14 0400)    PRN Medications: sodium chloride, sodium chloride, acetaminophen, albuterol, benzonatate, docusate sodium, loratadine, ondansetron (ZOFRAN) IV, sodium chloride, sodium chloride   Assessment:   1. 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 H/O LV thrombus 3. H/O Non Hodgkins Lymphoma- He has h/o recurrent lymphoma (1993, 2007) treated with chemo (including adriamycin) -Last seen by Dr Alen Blew 02/2013. No evidence of recurrence at that time.  4. H/O Extensive thrombus in the left common femoral vein, left iliac  veins and IVC this was noted on CT of in 2014.  5. DM  6. Hypokalemia 7. Blood type O+  Plan/Discussion:    Much better this am. BP improved. Renal function stable. CVP 2. Will hold diuretics again today. Continue vanc for at least 48 hours. Await BCX results. Possibly home in am on milrinone. VAD w/u continues.  Back on Xarelto 20 mg daily for AF.   Benay Spice 9:59 AM Advanced Heart Failure Team Pager [phone] (M-F; 7a - 4p)  Please contact [facility] for night-coverage after hours (4p -7a ) and weekends on [email or web address]

## 2014-06-23 DIAGNOSIS — I5023 Acute on chronic systolic (congestive) heart failure: Principal | ICD-10-CM

## 2014-06-23 LAB — LUPUS ANTICOAGULANT PANEL
DRVVT: 53.3 secs — ABNORMAL HIGH (ref <42.9)
Drvvt confirmation: 0.84 Ratio (ref <1.15)
Lupus Anticoagulant: NOT DETECTED
PTT LA: 47 s — AB (ref 28.0–43.0)
PTTLA 4:1 Mix: 44.7 secs — ABNORMAL HIGH (ref 28.0–43.0)
PTTLA Confirmation: 0 secs (ref <8.0)
dRVVT Incubated 1:1 Mix: 47.2 secs — ABNORMAL HIGH (ref <42.9)

## 2014-06-23 LAB — GLUCOSE, CAPILLARY: Glucose-Capillary: 101 mg/dL — ABNORMAL HIGH (ref 70–99)

## 2014-06-23 LAB — CARBOXYHEMOGLOBIN
Carboxyhemoglobin: 1.4 % (ref 0.5–1.5)
Methemoglobin: 0.7 % (ref 0.0–1.5)
O2 SAT: 60.5 %
TOTAL HEMOGLOBIN: 14.1 g/dL (ref 13.5–18.0)

## 2014-06-23 LAB — PSA: PSA: 0.08 ng/mL — AB (ref <=4.00)

## 2014-06-23 MED ORDER — ISOSORBIDE MONONITRATE 15 MG HALF TABLET
15.0000 mg | ORAL_TABLET | Freq: Every day | ORAL | Status: DC
Start: 2014-06-23 — End: 2014-06-30

## 2014-06-23 MED ORDER — SPIRONOLACTONE 25 MG PO TABS: 25.0000 mg | ORAL_TABLET | Freq: Every day | ORAL | Status: DC

## 2014-06-23 MED ORDER — HYDRALAZINE HCL 25 MG PO TABS: 12.5000 mg | ORAL_TABLET | Freq: Three times a day (TID) | ORAL | Status: DC

## 2014-06-23 MED ORDER — INSULIN GLARGINE 100 UNIT/ML ~~LOC~~ SOLN: 45.0000 [IU] | Freq: Every day | SUBCUTANEOUS | Status: DC

## 2014-06-23 MED ORDER — RIVAROXABAN 20 MG PO TABS: 20.0000 mg | ORAL_TABLET | Freq: Every day | ORAL | Status: DC

## 2014-06-23 MED ORDER — MILRINONE IN DEXTROSE 20 MG/100ML IV SOLN: 0.2500 ug/kg/min | INTRAVENOUS | Status: DC

## 2014-06-23 MED ORDER — ATORVASTATIN CALCIUM 40 MG PO TABS: 20.0000 mg | ORAL_TABLET | Freq: Every day | ORAL | Status: DC

## 2014-06-23 NOTE — Progress Notes (Addendum)
CARE MANAGEMENT NOTE 06/23/2014  Patient:  Andre Tran, Andre Tran   Account Number:  [id number]  Date Initiated:  06/18/2014  Documentation initiated by:  Elissa Hefty  Subjective/Objective Assessment:   adm w heart failure     Action/Plan:   lives w wife   Anticipated DC Date:  06/21/2014   Anticipated DC Plan:  [place]  CM consult  HF Clinic      Choice offered to / List presented to:     DME arranged  IV PUMP/EQUIPMENT        HH arranged  HH-1 RN  [place].   Status of service:  Completed, signed off Medicare Important Message given?  YES (If response is "NO", the following Medicare IM given date fields will be blank) Date Medicare IM given:  06/20/2014 Medicare IM given by:  MAYO,HENRIETTA Date Additional Medicare IM given:  06/23/2014 Additional Medicare IM given by:  Jonnie Finner  Discharge Disposition:  [place]  Per UR Regulation:  Reviewed for med. necessity/level of care/duration of stay  If discussed at [place] of Stay Meetings, dates discussed:    Comments:  ContactIla Mcgill [phone]  06/23/2014 11:25 AM NCM spoke to pt and gave permission to speak to wife, [name]. Contacted wife and states he has some of his meds at home. Explained refills were sent to [facility]. Contacted [facility] and all meds are $11. Xarelto needs prior auth and attending Darrick Grinder NP working on prior auth. Provided pt with Xarelto 30 day free trial card. States he does not recall using 30 day free trial card. He gets his meds from the [place]. Medicare IM given. [facility] aware of dc home today on IV Milrinone. Jonnie Finner RN CCM Case Mgmt phone [phone]       06/20/14 [place] is active - providing home health RN and home infusion tx.  12/9 1330 debbie dowell rn,bsn followed in heart failure clinic. may need  hhrn will follow and assist as pt progresses.

## 2014-06-23 NOTE — Progress Notes (Signed)
Advanced Heart Failure Rounding Note   Subjective:    53 y.o. 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 He has h/o recurrent lymphoma (1993, 2007) treated with chemo (including adriamycin) - details unclear. 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   Admitted 12/9 from HF clinic with increased dyspnea and fatigue. Swan placed. CO-OX 42%. He was started on milrinone 0.25 mcg and diuresed with IV lasix. Beta blocker was held.   Yesterday diuretics held due to low CVP. Weight up 1 pound. CVP 1. Afebrile  Denies SOB. Wants to go home.   CO-OX 60%.   Objective:   Weight Range:  Vital Signs:   Temp:  [98.1 F (36.7 C)-98.8 F (37.1 C)] 98.5 F (36.9 C) (12/14 0700) Pulse Rate:  [94-118] 101 (12/14 0700) Resp:  [15-25] 22 (12/14 0700) BP: (82-130)/(43-110) 98/69 mmHg (12/14 0700) SpO2:  [93 %-100 %] 97 % (12/14 0700) Weight:  [160 lb 4.4 oz (72.7 kg)] 160 lb 4.4 oz (72.7 kg) (12/14 0500) Last BM Date: 06/17/14  Weight change: Filed Weights   06/20/14 0400 06/21/14 0500 06/23/14 0500  Weight: 164 lb 14.5 oz (74.8 kg) 159 lb 9.8 oz (72.4 kg) 160 lb 4.4 oz (72.7 kg)    Intake/Output:   Intake/Output Summary (Last 24 hours) at 06/23/14 0746 Last data filed at 06/23/14 0700  Gross per 24 hour  Intake 1164.6 ml  Output   1900 ml  Net -735.4 ml     Physical Exam: CVP 1  General:  Lying in bed.  HEENT: normal Neck: supple. JVP flat. Carotids 2+ bilat; no bruits. No lymphadenopathy or thryomegaly appreciated.   Cor: PMI laterally displaced. Regular rate & rhythm. Lungs: clear Abdomen: soft, nontender, nondistended. No hepatosplenomegaly. No bruits or masses. Good bowel sounds. Extremities: no cyanosis, clubbing, rash, edema RUE double lumen  Neuro: alert & orientedx3, cranial nerves grossly  intact. moves all 4 extremities w/o difficulty. Affect pleasant  Telemetry: SR ~90s  Labs: Basic Metabolic Panel:  Recent Labs Lab 06/18/14 1128 06/19/14 0430 06/20/14 0400 06/21/14 0525 06/22/14 0830  NA 134* 134* 133* 137 133*  K 5.1 3.6* 4.4 4.0 4.1  CL 98 95* 95* 98 98  CO2 24 28 24 25 26   GLUCOSE 252* 230* 287* 107* 81  BUN 17 18 17 15 14   CREATININE 0.88 0.98 0.92 0.89 0.99  CALCIUM 8.9 8.8 8.8 9.3 8.3*  MG 1.8  --   --   --   --     Liver Function Tests:  Recent Labs Lab 06/18/14 1128  AST 24  ALT 61*  ALKPHOS 213*  BILITOT 2.4*  PROT 5.9*  ALBUMIN 3.2*   No results for input(s): LIPASE, AMYLASE in the last 168 hours. No results for input(s): AMMONIA in the last 168 hours.  CBC:  Recent Labs Lab 06/18/14 1128 06/21/14 0525 06/22/14 0830  WBC 4.2 5.0 5.2  NEUTROABS 2.0  --   --   HGB 13.6 15.1 14.7  HCT 40.2 44.4 43.7  MCV 93.9 89.9 90.5  PLT 206 171 166    Cardiac Enzymes:  Recent Labs Lab 06/18/14 1128 06/18/14 1650 06/19/14 0430  TROPONINI <0.30 <0.30 <0.30    BNP: BNP (last 3 results)  Recent Labs  06/12/14 1415 06/18/14 1200  PROBNP 2079.0* 2144.0*     Other results:  Imaging: No results found.   Medications:     Scheduled Medications: . atorvastatin  20 mg Oral Daily  . digoxin  0.125 mg Oral Daily  . hydrALAZINE  12.5 mg Oral 3 times per day  . insulin aspart  0-15 Units Subcutaneous TID WC  . insulin aspart  0-5 Units Subcutaneous QHS  . insulin glargine  45 Units Subcutaneous QHS  . isosorbide mononitrate  15 mg Oral Daily  . losartan  50 mg Oral Daily  . rivaroxaban  20 mg Oral Q supper  . sodium chloride  10-40 mL Intracatheter Q12H  . sodium chloride  3 mL Intravenous Q12H  . spironolactone  25 mg Oral Daily  . vancomycin  750 mg Intravenous Q8H    Infusions: . milrinone 0.25 mcg/kg/min (06/23/14 2876)    PRN Medications: sodium chloride, sodium chloride, acetaminophen, albuterol,  benzonatate, docusate sodium, loratadine, ondansetron (ZOFRAN) IV, sodium chloride, sodium chloride   Assessment:   1. 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 H/O LV thrombus 3. H/O Non Hodgkins Lymphoma- He has h/o recurrent lymphoma (1993, 2007) treated with chemo (including adriamycin) -Last seen by Dr Alen Blew 02/2013. No evidence of recurrence at that time.  4. H/O Extensive thrombus in the left common femoral vein, left iliac  veins and IVC this was noted on CT of in 2014.  5. DM  6. Hypokalemia 7. Blood type O+  Plan/Discussion:    CO-OX stable. Home on Milrinone 0.25 mcg. Volume status a little. Will start lasix 20 mg as needed for weight 163 or greater. BP soft. Continue losartan 50 mg daily, hydralazine 12.5 mg tid/imdur 15 daily, and 25 spironolactone daily.   Stop antibiotics. [place] pending.   Back on Xarelto 20 mg daily for AF.  Home today with [facility] to follow for home milrinone.   CLEGG,AMY NP-C  7:46 AM Advanced Heart Failure Team Pager [phone] (M-F; 7a - 4p)  Please contact [facility] for night-coverage after hours (4p -7a ) and weekends on [email or web address]  Patient seen and examined with Darrick Grinder, NP. We discussed all aspects of the encounter. I agree with the assessment and plan as stated above.   Looks great on milrinone. CVP 1 off diuretics. No more fevers. BCX negative. Will send home today on current meds. Can use lasix prn. VAD work-ip underway. Follow closely in HF clinic.  Yumi Insalaco,MD 8:11 AM

## 2014-06-24 ENCOUNTER — Telehealth (HOSPITAL_COMMUNITY): Payer: Self-pay | Admitting: Vascular Surgery

## 2014-06-24 NOTE — Telephone Encounter (Signed)
Spoke w/Andre Tran, pt's Milrinone is infusing fine with no complications, she states she tried several times but could not get blood from right port, will eval at appt on 12/21 since medication is infusing

## 2014-06-24 NOTE — Telephone Encounter (Signed)
[facility] ... [facility] from [facility] he has Picc line.. Pt has no blood return .. picc completely clotted Please advise

## 2014-06-25 LAB — FACTOR 5 LEIDEN

## 2014-06-28 LAB — CULTURE, BLOOD (ROUTINE X 2)
CULTURE: NO GROWTH
Culture: NO GROWTH

## 2014-06-30 ENCOUNTER — Encounter (HOSPITAL_COMMUNITY): Payer: Self-pay

## 2014-06-30 ENCOUNTER — Ambulatory Visit (HOSPITAL_COMMUNITY)
Admission: RE | Admit: 2014-06-30 | Discharge: 2014-06-30 | Disposition: A | Discharge status: Home / Self Care | Payer: Medicare Other | Source: Ambulatory Visit | Attending: Cardiology | Admitting: Cardiology

## 2014-06-30 ENCOUNTER — Telehealth (HOSPITAL_COMMUNITY): Payer: Self-pay | Admitting: Vascular Surgery

## 2014-06-30 VITALS — BP 114/61 | HR 77 | Resp 18 | Wt 166.2 lb

## 2014-06-30 DIAGNOSIS — Z8572 Personal history of non-Hodgkin lymphomas: Secondary | ICD-10-CM | POA: Insufficient documentation

## 2014-06-30 DIAGNOSIS — I428 Other cardiomyopathies: Secondary | ICD-10-CM | POA: Insufficient documentation

## 2014-06-30 DIAGNOSIS — Z7951 Long term (current) use of inhaled steroids: Secondary | ICD-10-CM | POA: Insufficient documentation

## 2014-06-30 DIAGNOSIS — Z8673 Personal history of transient ischemic attack (TIA), and cerebral infarction without residual deficits: Secondary | ICD-10-CM | POA: Diagnosis not present

## 2014-06-30 DIAGNOSIS — E119 Type 2 diabetes mellitus without complications: Secondary | ICD-10-CM | POA: Insufficient documentation

## 2014-06-30 DIAGNOSIS — F329 Major depressive disorder, single episode, unspecified: Secondary | ICD-10-CM | POA: Insufficient documentation

## 2014-06-30 DIAGNOSIS — Z95810 Presence of automatic (implantable) cardiac defibrillator: Secondary | ICD-10-CM | POA: Diagnosis not present

## 2014-06-30 DIAGNOSIS — Z79899 Other long term (current) drug therapy: Secondary | ICD-10-CM | POA: Diagnosis not present

## 2014-06-30 DIAGNOSIS — Z86718 Personal history of other venous thrombosis and embolism: Secondary | ICD-10-CM | POA: Insufficient documentation

## 2014-06-30 DIAGNOSIS — I10 Essential (primary) hypertension: Secondary | ICD-10-CM | POA: Diagnosis not present

## 2014-06-30 DIAGNOSIS — E782 Mixed hyperlipidemia: Secondary | ICD-10-CM | POA: Diagnosis not present

## 2014-06-30 DIAGNOSIS — Z7902 Long term (current) use of antithrombotics/antiplatelets: Secondary | ICD-10-CM | POA: Insufficient documentation

## 2014-06-30 DIAGNOSIS — I5022 Chronic systolic (congestive) heart failure: Secondary | ICD-10-CM | POA: Diagnosis present

## 2014-06-30 DIAGNOSIS — Z794 Long term (current) use of insulin: Secondary | ICD-10-CM | POA: Diagnosis not present

## 2014-06-30 MED ORDER — HYDRALAZINE HCL 25 MG PO TABS: 12.5000 mg | ORAL_TABLET | Freq: Three times a day (TID) | ORAL | Status: DC

## 2014-06-30 MED ORDER — ISOSORBIDE MONONITRATE ER 30 MG PO TB24: 15.0000 mg | ORAL_TABLET | Freq: Every day | ORAL | Status: DC

## 2014-06-30 NOTE — Progress Notes (Signed)
Patient ID: GLENVILLE ESPINA, male   DOB: 11/04/1960, 53 y.o.   MRN: [id number]  Physicians EP : Dr Lovena Le VA: Dr Jenny Reichmann Oncologist: Dr Alen Blew  PCP: Dr. Maudie Mercury ([facility] [place])  HPI: 53 y.o. 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  He has h/o recurrent lymphoma (1993, 2007) treated with chemo (including adriamycin) - details unclear.  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   Evaluated in [facility] ED 11/02/12 due to dyspnea and later discharged. He had been out of lasix for 1 month. He was restarted on lasix 40 mg daily. CEs negative. Pro BNP 1633.   Admitted 97/6-73/41/93 with A/C systolic HF and cardiogenic shock. Initial co-ox 42%. He was started on Milrinone and PICC placed for home milrinone. Started VAD work up.   [facility] Follow up for Heart Failure: Doing well since being home. Was taking hydralazine incorrectly and was taking hydralazine 10 mg TID at home instead of 12.5 mg TID and was not taking Imdur 15 mg daily. Feeling great. Denies SOB, orthopnea, PND or CP. Able to walk around the whole grocery store and upstairs with no issues. RUE PICC with milrinone 0.25 mcg. Weight at home 160 lbs. Appetite better. Following a low salt diet and drinking less than 2L a day.   12/12/12 CPX Peak VO2: 16.9 ml/kg/min % predicted peak VO2: 46.9% VE/VCO2 slope: 33.3 OUES: 1.36 Peak RER: 1.19 Moderate to severe functional limitation due to heart failure.  12/16/11 ECHO EF 15% global HK. No apical thrombus.  12/04/12 ECHO EF 15% RV mildly hypokinetic  06/18/14: Echo EF 20-25%, grade III DD, mild MR, mod TR, RV sys fx moderately reduced  Labs 01/16/13 K 4.7 Creatinine 1.1 Labs 03/21/13 Dig level 0.3  Labs 06/20/13: K+ 3.4, Cr 0.94, pro-BNP 3235 Labs: 06/12/14 K 5.3 Creatinine 1.04 Pro BNP 2079. Dig level < 0.3      SH: Lives  with wife in [place]. He is not a smoker or drink alcohol.   ROS: All systems negative except as listed in HPI, PMH and Problem List.  Past Medical History  Diagnosis Date  . Paroxysmal ventricular tachycardia     s/p AICD '05, replaced w/ St. Jude's in 2010 (PVT/V.Fib arrest requiring ICD implant in 2005)  . HYPERTENSION, UNSPECIFIED   . HYPERLIPIDEMIA-MIXED   . 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 Nonischemic cardiomyopathy     2/2 Adriamycin administration for lymphoma  . CHF (congestive heart failure)     EF 15% by echo 2009; s/p St. Jude ICD  . Diabetes mellitus     Type 2  . Cancer      non hodkins lymphoma 1992, Hodgkins 2007  . Depression   . LV (left ventricular) mural thrombus     2009, on coumadin  . Small bowel obstruction     s/p Small bowel resection 2001  . Jejunal intussusception     2008  . 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 Splenic mass     Noted on Abd [place] 2009 w/ recs for f/u CT - not done  . Hypotension   . Noncompliance with medication regimen   . AICD (automatic cardioverter/defibrillator)  present   . Presence of permanent cardiac pacemaker   . Shortness of breath dyspnea     Current Outpatient Prescriptions  Medication Sig Dispense Refill  . albuterol (PROVENTIL HFA;VENTOLIN HFA) 108 (90 BASE) MCG/ACT inhaler Inhale 1-2 puffs into the lungs every 6 (six) hours as needed for wheezing or shortness of breath.    Marland Kitchen atorvastatin (LIPITOR) 40 MG tablet Take 0.5 tablets (20 mg total) by mouth daily. 30 tablet 6  . digoxin (LANOXIN) 0.125 MG tablet Take 0.125 mg by mouth daily.    . insulin glargine (LANTUS) 100 UNIT/ML injection Inject 0.45 mLs (45 Units total) into the skin at bedtime. 10 mL 11  . isosorbide mononitrate (IMDUR) 30 MG 24 hr tablet Take 15 mg by mouth daily.    Marland Kitchen losartan (COZAAR) 100  MG tablet Take 50 mg by mouth daily.    . milrinone (PRIMACOR) 20 MG/100ML SOLN infusion Inject 19.5 mcg/min into the vein continuous. 100 mL 6  . rivaroxaban (XARELTO) 20 MG TABS tablet Take 1 tablet (20 mg total) by mouth daily with supper. 30 tablet 6  . spironolactone (ALDACTONE) 25 MG tablet Take 1 tablet (25 mg total) by mouth daily. 30 tablet 6  . hydrALAZINE (APRESOLINE) 25 MG tablet Take 0.5 tablets (12.5 mg total) by mouth every 8 (eight) hours. (Patient not taking: Reported on 06/30/2014) 60 tablet 6   No current facility-administered medications for this encounter.     Filed Vitals:   06/30/14 1504  BP: 114/61  Pulse: 77  Resp: 18  Weight: 166 lb 4 oz (75.411 kg)  SpO2: 98%   PHYSICAL EXAM: General:  Chronically ill  appearing. Appears fatigued. No resp difficulty Wife present HEENT: normal Neck: supple. JVP to jaw. Carotids 2+ bilaterally; no bruits. No lymphadenopathy or thryomegaly appreciated. Cor: PMI normal. Regular rate & rhythm. No rubs, gallops or murmurs. Lungs: CTA Abdomen: soft, nontender,  nondistended. No hepatosplenomegaly. No bruits or masses. Good bowel sounds. Extremities: no cyanosis, clubbing, rash, edema Neuro: alert & orientedx3, cranial nerves grossly intact. Moves all 4 extremities w/o difficulty. Affect pleasant.  ASSESSMENT/ PLAN:  1. Chronic systolic HF: NICM likely due to adriamycin; EF 20-25%, grade III DD, mod TR, RV sys fx mod reduced. (06/2014); ST Jude ICD.  - NYHA II symptoms and volume status stable. Currently not on diuretics and just on spironolactone 25 mg daily.  - Not on BB with low out put recently.  - Will start hydralazine 12.5 mg TID and Imdur 15 mg daily. He was on these in the hospital, however did not get filled yet and have asked him to please pick up.  - Continue milrinone 0.25 mcg through PICC and continue to get weekly labs. - Will need to continue the whole VAD work-up and then discuss patient at [facility] meeting to see  whether he will be a good VAD candidate.  - Reinforced the need and importance of daily weights, a low sodium diet, and fluid restriction (less than 2 L a day). Instructed to call the HF clinic if weight increases more than 3 lbs overnight or 5 lbs in a week.  2. H/o LV thrombus - On xarelto 20 mg daily and denies any bleeding issues.  3. Non Hodgkins Lymphoma- He has h/o recurrent lymphoma (1993, 2007) treated with chemo (including adriamycin) -Followed by Dr. Alen Blew.  F/U 3 weeks and will set up time for CSW to do VAD evaluation with patient.   Junie Bame B NP-C 06/30/2014 3:14 PM

## 2014-06-30 NOTE — Telephone Encounter (Signed)
Open in error

## 2014-06-30 NOTE — Patient Instructions (Signed)
Start hydralazine 12.5 mg (1/2 tablet) three times a day,.  Start Imdur 15 mg (1/2 tablet) daily.  Will have social worker call to set up an appointment with you guys.  Have a wonderful Christmas and a Happy New Years.   Follow up in 3 weeks.  Do the following things EVERYDAY: 1) Weigh yourself in the morning before breakfast. Write it down and keep it in a log. 2) Take your medicines as prescribed 3) Eat low salt foods-Limit salt (sodium) to 2000 mg per day.  4) Stay as active as you can everyday 5) Limit all fluids for the day to less than 2 liters 6)

## 2014-07-01 ENCOUNTER — Telehealth: Payer: Self-pay | Admitting: Licensed Clinical Social Worker

## 2014-07-01 NOTE — Telephone Encounter (Signed)
CSW contacted wife to rescheduled appointment from yesterday as wife forgot to arrive early to meet with CSW to discuss further LVAD assessment. Wife states that she will return to clinic on Monday July 07, 2014. CSW will complete LVAD assessment at that time. Andre Tran, [place]

## 2014-07-07 ENCOUNTER — Encounter: Payer: Self-pay | Admitting: Licensed Clinical Social Worker

## 2014-07-14 ENCOUNTER — Telehealth (HOSPITAL_COMMUNITY): Payer: Self-pay | Admitting: Vascular Surgery

## 2014-07-14 NOTE — Telephone Encounter (Signed)
Attempted to call nurse, no answer, VM left that we received her message and that we would pursue her again tomorrow to discuss patient's needs and situation.  Patient was very confused about medications at most recent visit to our clinic and was not taking a lot of his heart medications correctly, so i would rather verify this information with the nurse for patient safety.  Renee Pain

## 2014-07-14 NOTE — Telephone Encounter (Signed)
Nurse from [facility] called pt has a 5 lb weight gain in 5 days, and BP is up.. Please advise

## 2014-07-16 NOTE — Telephone Encounter (Signed)
Attempted to call patient, no answer.  Will attempt again later today.

## 2014-07-21 ENCOUNTER — Telehealth (HOSPITAL_COMMUNITY): Payer: Self-pay

## 2014-07-21 NOTE — Progress Notes (Signed)
[facility] CLINICAL SOCIAL WORK DOCUMENTATION LVAD (Left Ventricular Assist Device) Psychosocial Screening Please remember that all information is confidential within the members of the VAD team and [facility]  07/07/2014 10:30am  Patient:  Andre Tran  MRN:  [id number]  Account:  [id number]  Clinical Social Worker:  Ulla Gallo, LCSW Date/Time Initiated:   07/07/2014 10:30 AM Referral Source:    Zada Girt, [place] Coordinator  Referral Reason:   LVAD Implantation  Source of Information:   Patient, wife Andre Tran   PATIENT DEMOGRAPHICS NAME:   Andre Tran     DOB:  July 14, 1960  SS#:  [id number] Address:   [place] [address] Home Phone:  [phone]  Cell Phone:   [phone] Marital Status:   married for 22 years     Primary Language:  ENGLISH  Faith:  [place] [employer] Adherence with Medical Regimen:   Compliant  Medication Adherence:   Compliant  Physician appointment attendance:   Compliant   Do you have a Living Will or Medical POA?  Y Would you like to complete a Living Will and Medical POA prior to surgery?  N Are you currently a DNR?  N Do you have a MOST form?  N Would you like to review one?  N Do you have goals of care?  Y   Have you had a consult with the palliative care team at [facility]?  N Comment:   Psychological Health Appearance:   well groomed  Mental status:   alert and oriented  Eye Contact:   good  Thought Content:   Patient's thoughts were coherent and rational. Although, patient requires additional time to gather thoughts due to stroke.  Speech:   Normal rate  Mood:   Appropriate and very positive outlook  Affect:   positive  Insight:   Good  Judgment:   Good  Interaction Style:   Good   Family/Social Information Who lives in your home? Name9  Andre Tran   Age11  59DU  22UR  44HC  64BJ   Relationship to you9  wife  daughter  daughter   friend-"like a daughter"    Do you have a plan for child care if relevant?   n/a  List family members outside the home (parents, friends, pastor, etc..) Name2  Andre Tran    Relationship to [employer]  pastor    Please list people who give you emotional support (family, parents, friends, pastor, etc..) Name3  Andre Tran  multiple in laws    Relationship to you3  friend    Who is your primary and backup support pre and post-surgery? Explain the relationships i.e. strengths/weakness, etc.:   Andre Tran- primary  Secondary Caregiver identified:   Andre Tran- back up   Legal Do you currently have any legal issues/problems?   no  Durable POA or Legal Next of Kin:   Andre Tran - wife  [phone]   Living situation Travel distance to [facility]:  "down the street"  Second Hand Smoke Exposure:   no  Self- Care level:   Independent  Ambulation:   Independent  Transportation:   Patient does not drive- wife provides all transportation  Limitations:   unable to drive- Patient reports that he requires additional time to gather his thoughts due to past stroke.  Barriers impacting ability to participate in care:   Patient denies any concerns or barriers impacting his care.   Community  Are you active with community agencies/resources?   no  Are you active in a church, synagogue, mosque, or other faith based community?   [employer]  What other sources do you have for spiritual support?   family  Are you active in any clubs or social organizations?   no  What do you do for fun? hobbies, interests?  travel   Education/Work information What is the last grade of school you completed?  College- [employer] in 2003 with a degree in teaching  Preferred method of learning? (Written, verbal, hands on):   no preference  Do you have any problems with reading or writing?  "problems writing due to stroke"  Are you currently employed? If no, when were you last  employed?   no  Name of employer:   [employer] [place]  Please describe what kind of work you do/did?   Worked as a Catering manager  How long have you worked there?   2 years  If you are not currently working, do you plan to return to work after VAD surgery?   Patient reports that he would like to teach as he was never able to use his degree because he had the stroke shortly after receiving his degree.  If yes, what type of employment do you hope to find?   Teaching  Are you interested in job training or learning new skills?   no  Did you serve in the Military? If so, what branch?   [employer]/ 9 years   Financial Information What is your source of income?    Military pension/SSD  Do you have difficulty meeting your monthly expenses?   sometimes  If yes, which ones?    varies but "make it happen"  How do you usually cope with this?    improved budgeting- "mostly happens around the holidays"  Primary Health Insurance:    [employer]  Secondary Insurance:    Medicare  Have you applied for Medicaid?    no  Have you applied for Social Security Disability?    already receiving  Do you have prescription coverage?    [employer]  What are your prescription co-pays?    varies but many $3. If able to use [employer] then cost is $0.  Are you required to use a certain pharmacy?    No- currently use [facility] and [place]  Do you have a mail order option for your prescriptions?    yes  If yes, what [facility] you [facility]  Have you ever refused medication due to cost?    no  Discuss monthly cost for dressing supplies post procedure $150-300    discussed and reviewed  Can you budget for this monthly expense?    yes   Medical Information Briefly describe your medical history, surgeries and why you are here for evaluation.    Patient stated that he was diagnosed with non-Hodgkin's in 1997 and then years later was diagnosed with Hodgkin's. His heart failure began  after the cancer diagnosis in 1999. he had a defibrillator placed and then replaced in 2005. He continued with multiple health issues related to his heart failure and reports that he had a stroke in 2011.  Are you able to complete your ADL's?    Independent  Do you have any history of emotional, medical, physical or verbal trauma?    no  Do you have any family history of heart  problems?    no  Do you smoke? If so, what is the amount and frequency?    no  Do you drink alcohol? If so, how many drinks a day/week?    yes very occasionally  Have you ever used illegal drugs or misused medications?    no  If yes, what drugs do you use and how often?    n/a  Have you ever been treated for substance abuse?    no  If yes, where and when were you treated?    n/a  Are you currently using illegal substances?    no   Mental Health History Have you ever had problems with depression, anxiety or other mental health issues?   Patient shared that he was treated for depression due to cancer diagnosis because he was medically discharged from the [employer].  If yes, have you seen a counselor, psychiatrist or therapist?    Yes, states he saw a psychiatrist 1x and was then referred to a counselor.  If you are currently experiencing problems are you interested in talking with a professional?    no  Would you be interested in participating in an LVAD support group?  Y LVAD support group for: Patient Caregivers patient Have you or are you taking any medications for anxiety/depression or any mental health concerns?    no  If yes, Please list the medications?    n/a  How have you been feeling in the past year?    Good  How do you handle stressful situations?   "I don't have stress- I don't see things as stressful'  What are your coping strategies? Please List:    "my belief in God"  Have you had any past or current thoughts of suicide?    no  How many hours do you sleep at night?    2-10 hours  How is  your appetite?   "I love to eat"  PHQ2- Depression Screen:    0  PHQ9 Depression screen (only complete if the PHQ2 is positive):    n/a   Plan for VAD Implementation Do you know and understand what happens during VAD surgery?  Please describe your thoughts:   Patient's understanding was accurate and realistic. Patient was able to describe in detail the surgery and post op plan. He reported that he has met with the VAD Coordinator and read materials.  What do you know about the risks of any major surgery or use of general anesthesia?    Patient was able to verbalize the risks of surgery and anesthesia. he understands the risks of stroke, infection and death.  What do you know about the risks and side effects associated with VAD surgery?    Patient verbalized understanding of risks.  Explain what will happen right after surgery?    Patient verbalized he will be transferred to the ICU after surgery and will be intubated. Then to regular floor in a few days pending progress and stability. Patient appeared to have a good understanding of post op plan.  Information obtained from:    Patient and wife  What is your plan for transportation for the first 8 weeks post-surgery? (Patients are not recommended to drive post-surgery for 8 weeks)    Patient reports that he no longer drives due to the stroke. Patient's wife does all the driving and has committed to be avilable for all driving needs.  Driver: Andre Tran- wife  Valid license:    yes-wife  Working vehicle/last  inspection:    Andre Tran  Airbags:  yes- discussed at length Do you plan to disarm the airbags- there is a risk of discharging the device if the airbag were to deploy.   Patient states that he plans to remain seated in the back seat of the car.  What do you know about your diet post-surgery?    heart healthy  How do you plan to monitor your medications, current and future?    Patient reports that he and his wife pre pour his  medication box and plan to continue the same method.  How do you plan to complete ADL's post-surgery (Shower, dress, etc.)?    Wife will assist as needed.  Will it be difficult to ask for help for your caregivers? If so, explain:    Patient denied  Please explain what you hope will be improved about your life as a result of receiving the VAD    "walk around the park and ride my bicycle"  Please tell me your biggest concern or fear about receiving the VAD?    Patient denies any concerns  How do you cope with your concerns/fears?    "I don't worry"  Please explain your understanding of how your body will change? Are you worried about these changes?    Discussed body image and patient denies any concerns with body image.  Do you see any barriers to your surgery or follow up? If yes, please explain:   Patient responded "none"     Understanding of LVAD Surgical procedures and risks:    Discussed and reviewed  Electrical need for LVAD:    Discussed and reviewed  Safety precautions with LVAD (Water, etc.):   Discussed and reviewed  Potential side effects with LVAD:    Discussed and reviewed  Types of Advanced heart failure therapies available:    Discussed and reviewed  LVAD daily self-care (dressing changes, computer check, extra supplies):    Discussed and reviewed  Outpatient follow-up (follow-up in LVAD clinic; monitoring blood thinners)    Discussed and reviewed  Need for emergency planning:    Discussed and reviewed- plans to purchase a generator  Expectations for LVAD:    Discussed and reviewed. Patient is very positive about the LVAD and improvements it will make on his life.  Current level of motivation to prepare for LVAD:    Very Motivated stating "ready"  Patient's perception of need for LVAD:    Accurate and realistic  Present level of consent for LVAD:    Patient states he is ready.  Reasons for seeking LVAD:    Patient stated "So I can do more and live a longer more  fulfilling life".   Psychosocial Protective Factors  Andre Tran appears to have good psychosocial support with a very positive outlook. He has been married for 22 years and has twin daughters (78 yo) [place] and [place]. His wife Andre Tran will be the primary caregiver and daughter Andre Tran will be back up. His wife plans on taking a leave of absence from work to assist with caregiving but noted that she works from home so will continue to be available even after return to work. He is compliant with all medical follow up and reports he has a living Will and HPOA. He lives close to [facility] and has reliable transportation as he does not drive due to stroke. He is involved with his [employer] and states that he enjoys traveling as his hobby. He noted that  he spent 9 years in the [employer] and currently has a pension from the [place] as well as health benefits along with SSD benefits. He states he has good insurance and prescription plan and denies any concerns. He denies any history of trauma, substance abuse, current tobacco use and only very occasional alcohol use. He has no history of mental illness although noted a bout with depression after being medically discharged from the [employer]. He states that he does not have stress as "I don't see things as stressful because of my belief in God" He scored a 0 on the PHQ-2.He has no current legal issues and wife is durable POA. He has a realistic outlook and very positive attitude towards recovery. He denies any concerns with body image with the LVAD and additional equipment. He states his reasons for seeking the LVAD is "so I can have a longer more fulfilling life". He denies any barriers to implantation of the LVAD. Psychosocial Risk Factors  Patient has some cognitive deficits from a stroke which will require additional time for absorption of information and teach back.  Clinical Intervention: CSW discussed with VAD Coordinator and cognitive testing will be  performed to best assist patient with learning materials/methods.   Educated patient/family on the following Caregiver(s) role responsibilities:   Discussed and reviewed  Financial planning for LVAD:    Discussed and reviewed  Role of Clinical Social Worker:    Discussed and reviewed  Signs of depression and anxiety:    Discussed and reviewed and scored a 0 on PHQ-2  Support planning for LVAD:    Discussed and reviewed. Patient and wife hope to attend the LVAD support group.  LVAD process:    Discussed and reviewed  Caregiver contract/agreement:   Contracts to be completed with patient and wife by LVAD Coordinator  Discussed Referral(s) to:    Discussed LVAD Support Group and ongoing support from the [place]:    None at this time. CSW will continue to follow for support throughout recovery.  Clinical Impression Recommendations:    Andre Tran is a good psychosocial  candidate for the LVAD implantation. He appears realistic and accurate about his complex needs and understanding of the options for advanced therapies. He has good family support from his wife and daughters. He appears to have solid insurance with good financial support through [place] and SSD. He verbalizes the risks and understanding of water safety and electrical needs to maintain the equipment at home. He denies any issues with medical regimen or outpatient follow up needed for success with the LVAD. He appears to be very motivated for LVAD implantation and states he is ready when ever is needed. He is hopeful to live a more fulfilling life with the support of the LVAD implant. Raquel Sarna, [place]

## 2014-07-21 NOTE — Telephone Encounter (Signed)
[facility] RN Melanee Left called concerned about patient.  States he has been asking her a lot about how much wine and whiskey is okay to drink.  Also states that patient "rang in the new year" with alcohol and fried hot wings from [employer].  Also did not take his medications over the weekend and his weight and BP were up 5lbs, SBP 160s, but now back to baseline after she has ensured he took his medications this morning.  States she has spent majority of her time on education about importance of medication, diet, and fluid compliance, and also the negative effects alcohol has on his heart failure.  Feels reiteration from our clinic will help get him more serious about these issues.  Will add flag to his appointment note for tomorrow's visit with [place].  Renee Pain

## 2014-07-22 ENCOUNTER — Encounter: Payer: Self-pay | Admitting: Internal Medicine

## 2014-07-22 ENCOUNTER — Encounter (HOSPITAL_COMMUNITY): Payer: Medicare Other

## 2014-07-22 ENCOUNTER — Ambulatory Visit (INDEPENDENT_AMBULATORY_CARE_PROVIDER_SITE_OTHER): Payer: Medicare Other | Admitting: *Deleted

## 2014-07-22 DIAGNOSIS — I5022 Chronic systolic (congestive) heart failure: Secondary | ICD-10-CM

## 2014-07-22 DIAGNOSIS — I472 Ventricular tachycardia: Secondary | ICD-10-CM

## 2014-07-22 DIAGNOSIS — Z95810 Presence of automatic (implantable) cardiac defibrillator: Secondary | ICD-10-CM

## 2014-07-22 DIAGNOSIS — I4729 Other ventricular tachycardia: Secondary | ICD-10-CM

## 2014-07-23 ENCOUNTER — Ambulatory Visit (HOSPITAL_COMMUNITY)
Admission: RE | Admit: 2014-07-23 | Discharge: 2014-07-23 | Disposition: A | Discharge status: Home / Self Care | Payer: Medicare Other | Source: Ambulatory Visit | Attending: Internal Medicine | Admitting: Internal Medicine

## 2014-07-23 ENCOUNTER — Encounter (HOSPITAL_COMMUNITY): Payer: Self-pay

## 2014-07-23 ENCOUNTER — Encounter (HOSPITAL_COMMUNITY): Payer: Self-pay | Admitting: *Deleted

## 2014-07-23 ENCOUNTER — Other Ambulatory Visit (HOSPITAL_COMMUNITY): Payer: Self-pay | Admitting: *Deleted

## 2014-07-23 ENCOUNTER — Encounter: Payer: Self-pay | Admitting: Internal Medicine

## 2014-07-23 VITALS — BP 114/74 | HR 100 | Resp 20 | Wt 164.5 lb

## 2014-07-23 DIAGNOSIS — I513 Intracardiac thrombosis, not elsewhere classified: Secondary | ICD-10-CM

## 2014-07-23 DIAGNOSIS — I5022 Chronic systolic (congestive) heart failure: Secondary | ICD-10-CM | POA: Diagnosis present

## 2014-07-23 DIAGNOSIS — I213 ST elevation (STEMI) myocardial infarction of unspecified site: Secondary | ICD-10-CM | POA: Diagnosis not present

## 2014-07-23 DIAGNOSIS — Z01818 Encounter for other preprocedural examination: Secondary | ICD-10-CM

## 2014-07-23 DIAGNOSIS — I509 Heart failure, unspecified: Secondary | ICD-10-CM

## 2014-07-23 LAB — MDC_IDC_ENUM_SESS_TYPE_REMOTE
Battery Remaining Percentage: 50 %
Battery Voltage: 2.93 V
Brady Statistic AP VS Percent: 1 %
Brady Statistic AS VP Percent: 1 %
Brady Statistic RA Percent Paced: 1 %
Brady Statistic RV Percent Paced: 1 %
Date Time Interrogation Session: 20160113055132
HIGH POWER IMPEDANCE MEASURED VALUE: 50 Ohm
Implantable Pulse Generator Serial Number: 593606
Lead Channel Pacing Threshold Amplitude: 0.75 V
Lead Channel Pacing Threshold Pulse Width: 0.4 ms
Lead Channel Pacing Threshold Pulse Width: 0.4 ms
Lead Channel Sensing Intrinsic Amplitude: 11.7 mV
Lead Channel Sensing Intrinsic Amplitude: 4.9 mV
Lead Channel Setting Pacing Amplitude: 2.5 V
Lead Channel Setting Pacing Pulse Width: 0.4 ms
MDC IDC MSMT BATTERY REMAINING LONGEVITY: 53 mo
MDC IDC MSMT LEADCHNL RA IMPEDANCE VALUE: 480 Ohm
MDC IDC MSMT LEADCHNL RV IMPEDANCE VALUE: 340 Ohm
MDC IDC MSMT LEADCHNL RV PACING THRESHOLD AMPLITUDE: 0.75 V
MDC IDC SET LEADCHNL RA PACING AMPLITUDE: 2 V
MDC IDC SET LEADCHNL RV SENSING SENSITIVITY: 0.5 mV
MDC IDC SET ZONE DETECTION INTERVAL: 280 ms
MDC IDC STAT BRADY AP VP PERCENT: 1 %
MDC IDC STAT BRADY AS VS PERCENT: 99 %
Zone Setting Detection Interval: 310 ms

## 2014-07-23 MED ORDER — FUROSEMIDE 40 MG PO TABS: 40.0000 mg | ORAL_TABLET | ORAL | Status: DC

## 2014-07-23 NOTE — Progress Notes (Signed)
VAD teaching continued with patient and wife following Heart Failure clinic visit. Wife had stated she had records of pt's last colonoscopy through VAD system and would bring to clinic visit today.  She was unable to find records and is now [facility].    Demonstrated the following with patient and wife:  ____ Overview of major warnings and cautions  ____ HM II LVAD overview of system operation  ____    Overview of system components (features and functions) ____ Changing power source ____ Overview of alerts and alarms ____ How to identify an emergency ____ How to handle an emergency including when the pump is running and when the pump has stopped  ____ Changing system controller  The patient had hands on practice with the following:  ____ Changing power source (from batteries to power module, power module to  batteries, and replacing batteries) ____ Perform system controller self test  ____ Perform power module self test  ____ Change system controller   The following routine activities and maintenance have been reviewed with patient and caregiver and both verbalize understanding:  ____ Stressed importance of never disconnecting power from both controller power leads at the same time, and never disconnecting both batteries at the same time, or the pump will stop ____ Plug the power module (PM) or the universal battery charger (UBC) into properly grounded (3 prong) outlets dedicated to PM use. Do NOT use adapter (cheater plug) for ungrounded outlets or multiple portable socket outlets (power strips) ____ The PM's internal battery (that provides limited backup power to the LVAD in the event of AC mains power failure) remains charged as long as the PM is connected to AC or DC power ____ The PM's internal battery provides approximately 30 minutes of emergency backup power for the LVAD ____ Transfer from PM to batteries during AC mains power failure. The PM  has internal backup battery that will power the pump while you transfer to batteries ____ Keep a backup system controller, charged batteries, battery clips, and             flashlight near you during sleep in case of electrical power outage ____ Always have backup system controller, battery clips, fully charged batteries, and spare fully charged batteries when traveling   Identified the following changes in activities of daily living with pump:  ____ No driving for at least six weeks and then only if doctor gives permission to do so. ____ No tub baths while pump implanted, and shower only if doctor gives  permission ____    No swimming or submersion in water while implanted with pump ____    Keep all VAD equipment away from water or moisture ____ Call the doctor or hospital contact person if any change in how the pump  ____ Plan to sleep only when connected to the power module. ____    Keep a backup system controller, charged batteries, battery clips, and             flashlight near you during sleep in case of electrical power outage ____ Talk with doctor before any long distance travel plans   Discussed frequency and importance of INR checks; emphasized importance of maintaining INR goal to prevent clotting and or bleeding issues with pump.    Pt and wife met with surgeon earlier today for surgical consult and "had a lot of questions answered".  Pt and wife had many questions re: extensive travel including [place] and international   travel.  Explained traveling with VAD includes discussing with VAD team prior trip, air travel with LVAD, and need for VAD center near destination. Unable to answer questions re: electrical outlets in [place]. Will research with [employer] for electrical compatibility for equipment outside of [place].  Pt and caregiver met with Jackie Brennan, LSW prior to this session.  Discussed with patient and caregiver MRB recommendations including:   1.  Follow up appt with Dr.  Shadad for history of Hodkin's.  Electronic and verbal referral request has been sent                   to Dr. Shadad for f/u appt asap. Wife and pt will f/u with his office.    2.  Pt needs neuropsych evaluation - wife states [facility] within                                   last year.  Wife is in process of getting those records today.  3.  Colonoscopy within last 10 years - wife is getting those records from [facility] today.  4.  Confirm secondary caregiver - daughter.  5.  Will need left heart cath - planned with next RHC.  Wife confirmed she could not find above mentioned medical records at her home, but is on the way to pick them up now from [facility].  She will bring back by clinic and give to VAD coordinator later today.                     

## 2014-07-23 NOTE — Addendum Note (Signed)
Encounter addended by: Scarlette Calico, RN on: 07/23/2014 10:20 AM<BR>     Documentation filed: Patient Instructions Section, Orders

## 2014-07-23 NOTE — Patient Instructions (Signed)
Change Furosemide (Lasix) to every other day   Weekly labs with [facility]  Your physician recommends that you schedule a follow-up appointment in: 3-4 weeks

## 2014-07-23 NOTE — Progress Notes (Signed)
Patient ID: Andre Tran, male   DOB: April 21, 1961, 54 y.o.   MRN: [id number]  Physicians EP : Dr Lovena Le [facility]: Dr Jenny Reichmann Oncologist: Dr Alen Blew  PCP: Dr. Maudie Mercury ([facility])  HPI: 54 y.o. 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  He has h/o recurrent lymphoma (1993, 2007) treated with chemo (including adriamycin) - details unclear.  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   Evaluated in [facility] ED 11/02/12 due to dyspnea and later discharged. He had been out of lasix for 1 month. He was restarted on lasix 40 mg daily. CEs negative. Pro BNP 1633.   Admitted 42/5-95/63/87 with A/C systolic HF and cardiogenic shock. Initial co-ox 42%. He was started on Milrinone and PICC placed for home milrinone. Started VAD work up.   [facility] Follow up for Heart Failure: Remains on milrinone. Doing well. Gets around without a problem. Able to go up and down steps. Goes to store. No edema. Weighing every day. Weight stable 161-163. Has HHRN and is being followed closely. Taking lasix every day now. Wife also following closely. She says he is taking his meds independently.    12/12/12 CPX Peak VO2: 16.9 ml/kg/min % predicted peak VO2: 46.9% VE/VCO2 slope: 33.3 OUES: 1.36 Peak RER: 1.19 Moderate to severe functional limitation due to heart failure.  12/16/11 ECHO EF 15% global HK. No apical thrombus.  12/04/12 ECHO EF 15% RV mildly hypokinetic  06/18/14: Echo EF 20-25%, grade III DD, mild MR, mod TR, RV sys fx moderately reduced  Labs 01/16/13 K 4.7 Creatinine 1.1 Labs 03/21/13 Dig level 0.3  Labs 06/20/13: K+ 3.4, Cr 0.94, pro-BNP 3235 Labs: 06/12/14 K 5.3 Creatinine 1.04 Pro BNP 2079. Dig level < 0.3  Labs: 07/21/13: K 5.0 Creatinine 1.57     SH: Lives with wife in [place]. He is not a smoker or drink alcohol.   ROS: All systems  negative except as listed in HPI, PMH and Problem List.  Past Medical History  Diagnosis Date  . Paroxysmal ventricular tachycardia     s/p AICD '05, replaced w/ St. Jude's in 2010 (PVT/V.Fib arrest requiring ICD implant in 2005)  . HYPERTENSION, UNSPECIFIED   . HYPERLIPIDEMIA-MIXED   . 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 Nonischemic cardiomyopathy     2/2 Adriamycin administration for lymphoma  . CHF (congestive heart failure)     EF 15% by echo 2009; s/p St. Jude ICD  . Diabetes mellitus     Type 2  . Cancer      non hodkins lymphoma 1992, Hodgkins 2007  . Depression   . LV (left ventricular) mural thrombus     2009, on coumadin  . Small bowel obstruction     s/p Small bowel resection 2001  . Jejunal intussusception     2008  . 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 Splenic mass     Noted on Abd [place] 2009 w/ recs for f/u CT - not done  . Hypotension   . Noncompliance with medication regimen   . AICD (automatic cardioverter/defibrillator) present   . Presence of permanent cardiac pacemaker   . Shortness of breath dyspnea  Current Outpatient Prescriptions  Medication Sig Dispense Refill  . albuterol (PROVENTIL HFA;VENTOLIN HFA) 108 (90 BASE) MCG/ACT inhaler Inhale 1-2 puffs into the lungs every 6 (six) hours as needed for wheezing or shortness of breath.    Marland Kitchen atorvastatin (LIPITOR) 40 MG tablet Take 0.5 tablets (20 mg total) by mouth daily. 30 tablet 6  . digoxin (LANOXIN) 0.125 MG tablet Take 0.125 mg by mouth daily.    . furosemide (LASIX) 40 MG tablet Take 40 mg by mouth daily.    . hydrALAZINE (APRESOLINE) 10 MG tablet Take 10 mg by mouth 3 (three) times daily.    . insulin glargine (LANTUS) 100 UNIT/ML injection Inject 0.45 mLs (45 Units total) into the skin at bedtime. (Patient taking differently: Inject 40 Units into  the skin at bedtime. ) 10 mL 11  . isosorbide mononitrate (IMDUR) 30 MG 24 hr tablet Take 0.5 tablets (15 mg total) by mouth daily. 15 tablet 3  . losartan (COZAAR) 100 MG tablet Take 50 mg by mouth daily.    . milrinone (PRIMACOR) 20 MG/100ML SOLN infusion Inject 19.5 mcg/min into the vein continuous. 100 mL 6  . rivaroxaban (XARELTO) 20 MG TABS tablet Take 1 tablet (20 mg total) by mouth daily with supper. 30 tablet 6  . spironolactone (ALDACTONE) 25 MG tablet Take 1 tablet (25 mg total) by mouth daily. 30 tablet 6  . hydrALAZINE (APRESOLINE) 25 MG tablet Take 0.5 tablets (12.5 mg total) by mouth every 8 (eight) hours. (Patient not taking: Reported on 07/23/2014) 45 tablet 6   No current facility-administered medications for this encounter.     Filed Vitals:   07/23/14 0923  BP: 114/74  Pulse: 100  Resp: 20  Weight: 164 lb 8 oz (74.617 kg)  SpO2: 98%   PHYSICAL EXAM: General:  Wellappearing.  No resp difficulty Wife present HEENT: normal Neck: supple. JVP tflat Carotids 2+ bilaterally; no bruits. No lymphadenopathy or thryomegaly appreciated. Cor: PMI normal. Regular rate & rhythm. No rubs, gallops or murmurs. Lungs: CTA Abdomen: soft, nontender,  nondistended. No hepatosplenomegaly. No bruits or masses. Good bowel sounds. Extremities: no cyanosis, clubbing, rash, edema Neuro: alert & orientedx3, cranial nerves grossly intact. Moves all 4 extremities w/o difficulty. Affect pleasant.  ASSESSMENT/ PLAN:  1. Chronic systolic HF: NICM likely due to adriamycin; EF 20-25%, grade III DD, mod TR, RV sys fx mod reduced. (06/2014); ST Jude ICD.  - NYHA II symptoms on milrinone.He is a bit dry. Will chang lasix to every other day. Can take prn tablet as needed - Not on BB with low out put recently.  - Continue hydralazine 10 mg TID and Imdur 15 mg daily.  - Continue milrinone 0.25 mcg through PICC and continue to get weekly labs. - There has been question of his cognitive abilities to  handle VAD since his CVA but I think he is doing well and with his wife's help he will be ok. Has referral to SW to begin neurocognitive eval.  - Reinforced the need and importance of daily weights, a low sodium diet, and fluid restriction (less than 2 L a day). Instructed to call the HF clinic if weight increases more than 3 lbs overnight or 5 lbs in a week.  2. H/o LV thrombus - On xarelto 20 mg daily and denies any bleeding issues.  3. Non Hodgkins Lymphoma- He has h/o recurrent lymphoma (1993, 2007) treated with chemo (including adriamycin) -Followed by Dr. Alen Blew.  Overall much improved. CSW to do VAD evaluation  with patient. We discussed VAD evaluation process.  Total time spent 35 minutes. Over half that time spent discussing above.    Glori Bickers MD  07/23/2014 9:57 AM

## 2014-07-24 ENCOUNTER — Telehealth: Payer: Self-pay | Admitting: Licensed Clinical Social Worker

## 2014-07-24 ENCOUNTER — Telehealth (HOSPITAL_COMMUNITY): Payer: Self-pay | Admitting: Vascular Surgery

## 2014-07-24 NOTE — Telephone Encounter (Signed)
Nurse from [facility] called pt weight is up 3 lbs in one day please advise

## 2014-07-24 NOTE — Telephone Encounter (Signed)
CSW contacted wife to discuss need for follow up with obtaining medical records. Wife reports that she went to [place] yesterday but person needed to assist with medical records was not available. She provided name and number for CSW and reported she completed all paperwork at [facility] for records to be sent. [facility] and spoke [facility] Lynelle Doctor [phone] and confirmed need for medical records. CSW informed records will be mailed out today to [facility] clinic.Raquel Sarna, [place]

## 2014-07-24 NOTE — Progress Notes (Signed)
ICD remote received. Sensing, impedances consistent with previous device measurements. Histograms appropriate for patient and level of activity. 7 mode switch---longest 12 sec. No ventricular arrhythmias recorded. All other diagnostic data reviewed---CorVue abnormal late Dec x10d. Real time EGM demonstrates appropriate sensing. Estimated longevity 4.3-4.75yrs. ROV w/ GT 5/16.

## 2014-07-24 NOTE — Telephone Encounter (Signed)
Patient gained 2-3 lbs overnight, per OV yesterday with our providers patient's lasix was decreased to every other day as he seemed "dried out" on exam.  Patient denies SOB, edema, or any other s/s of CHF exacerbation.  Will wait until tomorrow to see if he continues to gain once he takes his next lasix tablet Friday morning.  Reminded that he can take PRN tablet if needed.  Will follow up Friday to see where his weight and status is.  Renee Pain

## 2014-07-25 ENCOUNTER — Telehealth: Payer: Self-pay | Admitting: Oncology

## 2014-07-25 NOTE — Telephone Encounter (Signed)
S/W PATIENT WIFE AND GAVE APPT FOR 2/2 @ 4 W/DR. SHADAD.

## 2014-07-28 ENCOUNTER — Telehealth (HOSPITAL_COMMUNITY): Payer: Self-pay | Admitting: Vascular Surgery

## 2014-07-28 NOTE — Telephone Encounter (Signed)
[place] RN Sid Falcon called concerned about patient's BP.  60's over 50's, patient dizzy and a little fatigued, but otherwise stable and not overly symptomatic.  In reviewing his meds, patient was still taking 6.25mg  carvedilol every am, which was discontinued from his list.  RN made aware and make sure this medication is out of patient's home.  Instructed he can drink extra glass of water to compensate his BP.  Aware and agreeable.  Renee Pain

## 2014-07-28 NOTE — Telephone Encounter (Signed)
[facility]urse form [facility] calle pt has a low bp today 66/52. Nurse needs a call back to clarify pt medications.. Please advise

## 2014-07-31 ENCOUNTER — Telehealth (HOSPITAL_COMMUNITY): Payer: Self-pay | Admitting: *Deleted

## 2014-07-31 NOTE — Telephone Encounter (Signed)
Received labs from [facility] K 4.6, bun 34 cr 1.33 mag 1.6, per Darrick Grinder, NP have pt start mag-ox 400 mg daily attempted to call pt and Left message to call back

## 2014-08-04 ENCOUNTER — Other Ambulatory Visit (HOSPITAL_COMMUNITY): Payer: Self-pay

## 2014-08-04 MED ORDER — MAGNESIUM 400 MG PO TABS: 1.0000 | ORAL_TABLET | Freq: Every day | ORAL | Status: DC

## 2014-08-04 NOTE — Telephone Encounter (Signed)
Patient made aware of lab results and starting magnesium 400mg  tablet once daily.  Renee Pain

## 2014-08-07 ENCOUNTER — Telehealth (HOSPITAL_COMMUNITY): Payer: Self-pay | Admitting: *Deleted

## 2014-08-07 ENCOUNTER — Encounter: Payer: Self-pay | Admitting: Cardiology

## 2014-08-07 MED ORDER — SPIRONOLACTONE 25 MG PO TABS: 12.5000 mg | ORAL_TABLET | Freq: Every day | ORAL | Status: DC

## 2014-08-07 NOTE — Telephone Encounter (Signed)
Received labs from [facility], K 5.5 bun 30 cr 1.21, mag 1.8, per Darrick Grinder, NP if pt not taking kcl decrease spiro to 12.5 mg daily, spoke w/pt's wife she states pt is not taking kcl and she will have him cut spiro back to 1/2 tab daily, pt is getting weekly labs

## 2014-08-12 ENCOUNTER — Emergency Department (HOSPITAL_COMMUNITY)
Admission: EM | Admit: 2014-08-12 | Discharge: 2014-08-12 | Disposition: A | Discharge status: Home / Self Care | Payer: Medicare Other | Attending: Emergency Medicine | Admitting: Emergency Medicine

## 2014-08-12 ENCOUNTER — Encounter (HOSPITAL_COMMUNITY): Payer: Self-pay

## 2014-08-12 ENCOUNTER — Ambulatory Visit (HOSPITAL_BASED_OUTPATIENT_CLINIC_OR_DEPARTMENT_OTHER): Payer: Medicare Other | Admitting: Oncology

## 2014-08-12 VITALS — BP 118/66 | HR 99 | Temp 98.2°F | Resp 18 | Ht 67.0 in | Wt 175.6 lb

## 2014-08-12 DIAGNOSIS — Z8572 Personal history of non-Hodgkin lymphomas: Secondary | ICD-10-CM | POA: Insufficient documentation

## 2014-08-12 DIAGNOSIS — Z794 Long term (current) use of insulin: Secondary | ICD-10-CM | POA: Diagnosis not present

## 2014-08-12 DIAGNOSIS — I10 Essential (primary) hypertension: Secondary | ICD-10-CM | POA: Diagnosis not present

## 2014-08-12 DIAGNOSIS — Z79899 Other long term (current) drug therapy: Secondary | ICD-10-CM | POA: Diagnosis not present

## 2014-08-12 DIAGNOSIS — G8929 Other chronic pain: Secondary | ICD-10-CM | POA: Insufficient documentation

## 2014-08-12 DIAGNOSIS — E119 Type 2 diabetes mellitus without complications: Secondary | ICD-10-CM | POA: Diagnosis not present

## 2014-08-12 DIAGNOSIS — Z95810 Presence of automatic (implantable) cardiac defibrillator: Secondary | ICD-10-CM | POA: Insufficient documentation

## 2014-08-12 DIAGNOSIS — M5441 Lumbago with sciatica, right side: Secondary | ICD-10-CM | POA: Insufficient documentation

## 2014-08-12 DIAGNOSIS — I509 Heart failure, unspecified: Secondary | ICD-10-CM | POA: Insufficient documentation

## 2014-08-12 DIAGNOSIS — C8590 Non-Hodgkin lymphoma, unspecified, unspecified site: Secondary | ICD-10-CM

## 2014-08-12 DIAGNOSIS — F329 Major depressive disorder, single episode, unspecified: Secondary | ICD-10-CM | POA: Diagnosis not present

## 2014-08-12 DIAGNOSIS — I472 Ventricular tachycardia: Secondary | ICD-10-CM | POA: Insufficient documentation

## 2014-08-12 DIAGNOSIS — Z8579 Personal history of other malignant neoplasms of lymphoid, hematopoietic and related tissues: Secondary | ICD-10-CM

## 2014-08-12 DIAGNOSIS — Z8673 Personal history of transient ischemic attack (TIA), and cerebral infarction without residual deficits: Secondary | ICD-10-CM | POA: Insufficient documentation

## 2014-08-12 DIAGNOSIS — E782 Mixed hyperlipidemia: Secondary | ICD-10-CM | POA: Insufficient documentation

## 2014-08-12 DIAGNOSIS — I429 Cardiomyopathy, unspecified: Secondary | ICD-10-CM

## 2014-08-12 DIAGNOSIS — M545 Low back pain: Secondary | ICD-10-CM | POA: Diagnosis present

## 2014-08-12 MED ORDER — HYDROCODONE-ACETAMINOPHEN 5-325 MG PO TABS: 1.0000 | ORAL_TABLET | Freq: Four times a day (QID) | ORAL | Status: DC | PRN

## 2014-08-12 MED ORDER — ONDANSETRON 4 MG PO TBDP
4.0000 mg | ORAL_TABLET | Freq: Once | ORAL | Status: AC
  Administered 2014-08-12: 4 mg via ORAL
  Filled 2014-08-12: qty 1

## 2014-08-12 MED ORDER — HYDROCODONE-ACETAMINOPHEN 5-325 MG PO TABS
1.0000 | ORAL_TABLET | Freq: Once | ORAL | Status: AC
  Administered 2014-08-12: 1 via ORAL
  Filled 2014-08-12: qty 1

## 2014-08-12 NOTE — Progress Notes (Signed)
Hematology and Oncology Follow Up Visit  Andre Tran [id number] Dec 17, 1960 54 y.o. 08/12/2014 3:57 PM Andre Tran, Andre Reach, NP   Principle Diagnosis: 54 year old gentleman with history of Hodgkin's disease diagnosed in 1993 and subsequently developed non-Hodgkin's disease in 2007.   Prior Therapy: He is status post therapy for both lymphomas that included radiation and chemotherapy likely Adriamycin based although the details of this therapy is not available to me.  Current therapy: Observation and surveillance.  Interim History:  Andre Tran presents today for a followup visit. Since his last visit in 2014, he has not had any symptoms to suggest recurrent lymphoma. He has cardiomyopathy likely related to his previous Adriamycin treatments. He is under evaluation for possible LVAD placement. He is relatively asymptomatic at this time without any symptoms of chest pain or difficulty breathing. He had not reported any fevers or chills. Had not reported any lymphadenopathy or difficulty swallowing. His performance status and activity level remains unchanged. He does not report any headaches, blurry vision or syncope. He does not report any palpitation but does report occasional orthopnea or leg edema. He does not report any nausea, vomiting, abdominal pain or hematochezia. He does not report any genitourinary complaints of frequency urgency or hesitancy. Rest of his review of systems unremarkable.  Medications: I have reviewed the patient's current medications.  Current Outpatient Prescriptions  Medication Sig Dispense Refill  . albuterol (PROVENTIL HFA;VENTOLIN HFA) 108 (90 BASE) MCG/ACT inhaler Inhale 1-2 puffs into the lungs every 6 (six) hours as needed for wheezing or shortness of breath.    Marland Kitchen atorvastatin (LIPITOR) 40 MG tablet Take 0.5 tablets (20 mg total) by mouth daily. (Patient taking differently: Take 20 mg by mouth at bedtime. ) 30 tablet 6  . digoxin (LANOXIN) 0.125 MG tablet  Take 0.125 mg by mouth daily.    . furosemide (LASIX) 40 MG tablet Take 1 tablet (40 mg total) by mouth every other day. (Patient taking differently: Take 40 mg by mouth every other day as needed for fluid. ) 30 tablet   . glipiZIDE (GLUCOTROL) 5 MG tablet Take 5 mg by mouth daily before breakfast.    . hydrALAZINE (APRESOLINE) 25 MG tablet Take 0.5 tablets (12.5 mg total) by mouth every 8 (eight) hours. 45 tablet 6  . HYDROcodone-acetaminophen (NORCO) 5-325 MG per tablet Take 1-2 tablets by mouth every 6 (six) hours as needed for moderate pain. 20 tablet 0  . insulin glargine (LANTUS) 100 UNIT/ML injection Inject 0.45 mLs (45 Units total) into the skin at bedtime. 10 mL 11  . isosorbide mononitrate (IMDUR) 30 MG 24 hr tablet Take 0.5 tablets (15 mg total) by mouth daily. 15 tablet 3  . losartan (COZAAR) 100 MG tablet Take 50 mg by mouth at bedtime.     . Magnesium 400 MG TABS Take 1 tablet by mouth daily. (Patient taking differently: Take 1 tablet by mouth daily with breakfast. ) 90 tablet 3  . milrinone (PRIMACOR) 20 MG/100ML SOLN infusion Inject 19.5 mcg/min into the vein continuous. 100 mL 6  . rivaroxaban (XARELTO) 20 MG TABS tablet Take 1 tablet (20 mg total) by mouth daily with supper. 30 tablet 6  . spironolactone (ALDACTONE) 25 MG tablet Take 0.5 tablets (12.5 mg total) by mouth daily. (Patient taking differently: Take 12.5 mg by mouth at bedtime. ) 30 tablet 6   No current facility-administered medications for this visit.     Allergies: No Known Allergies  Past Medical History, Surgical history, Social  history, and Family History were reviewed and updated.  Physical Exam: Blood pressure 118/66, pulse 99, temperature 98.2 F (36.8 C), temperature source Oral, resp. rate 18, height 5\' 7"  (1.702 m), weight 175 lb 9.6 oz (79.652 kg), SpO2 100 %. ECOG: 1 General appearance: alert awake not in any distress. Head: Normocephalic, without obvious abnormality, atraumatic Neck: no  adenopathy Lymph nodes: Cervical, supraclavicular, and axillary nodes normal. Heart:regular rate and rhythm, S1, S2 normal, no murmur, click, rub or gallop Lung:chest clear, no wheezing, rales, normal symmetric air entry Abdomin: soft, non-tender, without masses or organomegaly EXT:no erythema, induration, or nodules   Lab Results: Lab Results  Component Value Date   WBC 5.2 06/22/2014   HGB 14.7 06/22/2014   HCT 43.7 06/22/2014   MCV 90.5 06/22/2014   PLT 166 06/22/2014     Chemistry      Component Value Date/Time   NA 133* 06/22/2014 0830   NA 137 01/16/2013 1041   K 4.1 06/22/2014 0830   K 4.7 01/16/2013 1041   CL 98 06/22/2014 0830   CO2 26 06/22/2014 0830   CO2 26 01/16/2013 1041   BUN 14 06/22/2014 0830   BUN 10.4 01/16/2013 1041   CREATININE 0.99 06/22/2014 0830   CREATININE 1.1 01/16/2013 1041      Component Value Date/Time   CALCIUM 8.3* 06/22/2014 0830   CALCIUM 10.0 01/16/2013 1041   ALKPHOS 213* 06/18/2014 1128   ALKPHOS 116 01/16/2013 1041   AST 24 06/18/2014 1128   AST 13 01/16/2013 1041   ALT 61* 06/18/2014 1128   ALT 13 01/16/2013 1041   BILITOT 2.4* 06/18/2014 1128   BILITOT 0.82 01/16/2013 1041       EXAM: CT CHEST, ABDOMEN AND PELVIS WITHOUT CONTRAST  TECHNIQUE: Multidetector CT imaging of the chest, abdomen and pelvis was performed following the standard protocol without IV contrast.  COMPARISON: CT chest abdomen and pelvis with contrast 01/22/2013.  FINDINGS: CT CHEST FINDINGS  Minimal linear scarring in the left lower lobe. Lungs otherwise clear without evidence of interstitial pulmonary edema, confluent airspace consolidation, or parenchymal nodules or masses. No pleural effusions. Central airways patent without significant bronchial wall thickening.  Heart mildly enlarged. Left subclavian pacing defibrillator lead tips at the right atrial appendage and right ventricular apex. Small pericardial effusion. Calcification  involving the left main coronary artery, LAD and right coronary artery. No visible atherosclerosis involving the thoracic aorta.  No significant mediastinal, hilar or axillary lymphadenopathy. Visualized thyroid gland unremarkable. Right jugular central venous catheter tip in the lower SVC.  Bone window images demonstrate sclerosis involving the T12 vertebral body.  CT ABDOMEN AND PELVIS FINDINGS  Normal unenhanced appearance of the liver, spleen, right adrenal glands, and kidneys. Focus of accessory splenic tissue inferior to the spleen. Moderate pancreatic atrophy. Stable approximate 1.9 x 1.1 cm nodule in the left adrenal gland. Gallbladder unremarkable. No biliary ductal dilation. Moderate aortoiliac atherosclerosis without aneurysm. No significant lymphadenopathy.  Stomach normal in appearance, filled with food. Normal appearing small bowel. Large stool burden in the cecum, ascending colon and transverse colon. Descending colon, sigmoid colon and rectum decompressed. Normal appendix in the right upper and mid pelvis. No ascites.  Urinary bladder unremarkable. Prostate gland and seminal vesicles normal for age. Pelvic phleboliths.  Bone window images demonstrate sclerosis involving the L1 and L2 vertebral bodies.  IMPRESSION: 1. No acute cardiopulmonary disease. 2. Mild cardiomegaly. Small pericardial effusion. Mild to moderate atherosclerosis involving the left main coronary artery, LAD and right coronary artery.  3. No acute abnormalities involving the abdomen or pelvis. 4. Moderate aortoiliac atherosclerosis without aneurysm. 5. Stable left adrenal adenoma. 6. Sclerosis involving the T12, L1 and L2 vertebral bodies. This may represent sclerotic osseous metastatic disease. However, since it is in 3 contiguous vertebral bodies, it may be related to prior radiation therapy if there is a history of such.   Impression and Plan:  54 year old gentleman with the  following issues:  1. History of Hodgkin's disease that dates back to 1993 treated with systemic chemotherapy the details of which are not available to me at this time  2. Non-Hodgkin's lymphoma diagnosed in 2007 again treated with systemic chemotherapy. The details of this specific disease in treatment is not available to me. CT scan from 06/20/2014 reviewed today did not show any evidence of recurrent disease at this time. In all likelihood he is cured from his lymphoma as he is approaching 10 years out without any evidence of recurrent disease. The sclerosis noted in T12-L1 and L2 have not really changed from previous scans obtained in July 2014. I see no need for routine scanning unless there is a reason to and we certainly will do it as needed.  3. Cardiomyopathy: He is under consideration for a possible left ventricular assisted device. I see no contraindication from my standpoint given his history of lymphoma is rather remote in his laboratory testing have been close to normal from a hematological standpoint.  Zola Button, MD 2/2/20163:57 PM

## 2014-08-12 NOTE — ED Provider Notes (Signed)
CSN: [id number]     Arrival date & time 08/12/14  1214 History  This chart was scribed for non-physician practitioner, Margarita Mail, PA-C working with Blanchie Dessert, MD by Frederich Balding, ED scribe. This patient was seen in room [place] and the patient's care was started at 1:10 PM.   Chief Complaint  Patient presents with  . Back Pain   The history is provided by the patient. No language interpreter was used.    HPI Comments: Andre Tran is a 54 y.o. male with history of CHF, non Hodgkin's lymphoma, and stroke who presents to the Emergency Department complaining of a chronic lower back pain exacerbation that started 3 months ago. States it has been intermittent and worsening the last few months. Pain radiates down posterior right leg intermittently. Bending and ambulation worsen pain but massage relieves some of it. States his hip flexion is decreased due to the stroke. Pt is about to get an LVAD placed to await a heart transplant. Denies fever, chills, bowel or bladder incontinence, new weakness. Denies history of IV drug use.   Past Medical History  Diagnosis Date  . Paroxysmal ventricular tachycardia     s/p AICD '05, replaced w/ St. Jude's in 2010 (PVT/V.Fib arrest requiring ICD implant in 2005)  . HYPERTENSION, UNSPECIFIED   . HYPERLIPIDEMIA-MIXED   . 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 Nonischemic cardiomyopathy     2/2 Adriamycin administration for lymphoma  . CHF (congestive heart failure)     EF 15% by echo 2009; s/p St. Jude ICD  . Diabetes mellitus     Type 2  . Cancer      non hodkins lymphoma 1992, Hodgkins 2007  . Depression   . LV (left ventricular) mural thrombus     2009, on coumadin  . Small bowel obstruction     s/p Small bowel resection 2001  . Jejunal intussusception     2008  . 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 Splenic mass     Noted on Abd [place] 2009 w/ recs for f/u CT - not done  . Hypotension   . Noncompliance with medication regimen   . AICD (automatic cardioverter/defibrillator) present   . Presence of permanent cardiac pacemaker   . Shortness of breath dyspnea    Past Surgical History  Procedure Laterality Date  . Cardiac defibrillator placement      2005, replaced w/ St. Jude's 2010  . Vasectomy    . Bowel resection      small bowell 2/2 obstruction 2001  . Pacemaker insertion    . Insert / replace / remove pacemaker     Family History  Problem Relation Age of Onset  . Other      No known family h/o heart disease  . Heart disease    . Diabetes Mother   . Other Other     complications from hip replacement   History  Substance Use Topics  . Smoking status: Never Smoker   . Smokeless tobacco: Never Used  . Alcohol Use: 1.2 oz/week    1 Cans of beer, 1 Shots of liquor per week     Comment: ON SPECIAL OCCASIONS    Review of Systems  Constitutional: Negative for fever and chills.  HENT: Negative for congestion.   Eyes: Negative for redness.  Respiratory: Negative for shortness of breath.   Cardiovascular: Negative for chest pain.  Gastrointestinal: Negative for abdominal distention.  Genitourinary:       Negative for bowel or bladder incontinence.  Musculoskeletal: Positive for myalgias and back pain.  Skin: Negative for rash.  Neurological: Negative for weakness.  Psychiatric/Behavioral: Negative for confusion.   Allergies  Review of patient's allergies indicates no known allergies.  Home Medications   Prior to Admission medications   Medication Sig Start Date End Date Taking? Authorizing Provider  albuterol (PROVENTIL HFA;VENTOLIN HFA) 108 (90 BASE) MCG/ACT inhaler Inhale 1-2 puffs into the lungs every 6 (six) hours as needed for wheezing or shortness of breath.    Historical Provider, MD  atorvastatin (LIPITOR) 40 MG tablet Take 0.5 tablets (20 mg  total) by mouth daily. 06/23/14   Amy D Ninfa Meeker, NP  digoxin (LANOXIN) 0.125 MG tablet Take 0.125 mg by mouth daily.    Historical Provider, MD  furosemide (LASIX) 40 MG tablet Take 1 tablet (40 mg total) by mouth every other day. 07/23/14   Jolaine Artist, MD  hydrALAZINE (APRESOLINE) 10 MG tablet Take 10 mg by mouth 3 (three) times daily.    Historical Provider, MD  hydrALAZINE (APRESOLINE) 25 MG tablet Take 0.5 tablets (12.5 mg total) by mouth every 8 (eight) hours. Patient not taking: Reported on 07/23/2014 06/30/14   Rande Brunt, NP  insulin glargine (LANTUS) 100 UNIT/ML injection Inject 0.45 mLs (45 Units total) into the skin at bedtime. Patient taking differently: Inject 40 Units into the skin at bedtime.  06/23/14   Amy D Ninfa Meeker, NP  isosorbide mononitrate (IMDUR) 30 MG 24 hr tablet Take 0.5 tablets (15 mg total) by mouth daily. 06/30/14   Rande Brunt, NP  losartan (COZAAR) 100 MG tablet Take 50 mg by mouth daily.    Historical Provider, MD  Magnesium 400 MG TABS Take 1 tablet by mouth daily. 08/04/14   Amy D Clegg, NP  milrinone (PRIMACOR) 20 MG/100ML SOLN infusion Inject 19.5 mcg/min into the vein continuous. 06/23/14   Amy D Ninfa Meeker, NP  rivaroxaban (XARELTO) 20 MG TABS tablet Take 1 tablet (20 mg total) by mouth daily with supper. 06/23/14   Amy D Ninfa Meeker, NP  spironolactone (ALDACTONE) 25 MG tablet Take 0.5 tablets (12.5 mg total) by mouth daily. 08/07/14   Amy D Clegg, NP   BP 102/59 mmHg  Pulse 100  Temp(Src) 98.1 F (36.7 C) (Oral)  Resp 18  SpO2 99%   Physical Exam  Constitutional: He is oriented to person, place, and time. He appears well-developed and well-nourished. No distress.  HENT:  Head: Normocephalic and atraumatic.  Eyes: Conjunctivae and EOM are normal.  Neck: Neck supple. No tracheal deviation present.  Cardiovascular: Normal rate.   Pulmonary/Chest: Effort normal. No respiratory distress.  Musculoskeletal: Normal range of motion.  Right lumbar tenderness.  Tenderness over sacrum. Full strength and ROM of bilateral legs. Negative straight leg raise.  Neurological: He is alert and oriented to person, place, and time.  Skin: Skin is warm and dry.  Psychiatric: He has a normal mood and affect. His behavior is normal.  Nursing note and vitals reviewed.   ED Course  Procedures (including critical care time)  DIAGNOSTIC STUDIES: Oxygen Saturation is 99% on RA, normal by my interpretation.    COORDINATION OF CARE: 1:20 PM-Discussed treatment plan which includes pain medication with pt at bedside and pt agreed to plan.   Labs Review Labs Reviewed - No data to  display  Imaging Review No results found.   EKG Interpretation None      MDM   Final diagnoses:  Right-sided low back pain with right-sided sciatica   I reviewed the patient med list with pharmacist for any interactions. Patient will be discharged with norco    Patient with back pain.  No neurological deficits and normal neuro exam.  Patient can walk but states is painful.  No loss of bowel or bladder control.  No concern for cauda equina.  No fever, night sweats, weight loss, h/o cancer, IVDU.  RICE protocol and pain medicine indicated and discussed with patient.   I personally performed the services described in this documentation, which was scribed in my presence. The recorded information has been reviewed and is accurate.  Margarita Mail, PA-C 08/12/14 1421  Blanchie Dessert, MD 08/12/14 1510

## 2014-08-12 NOTE — ED Notes (Signed)
Pt having chronic back pain x 2 months.  Pain coming and going down legs.  Having open heart surgery soon.  Pain has gotten worse

## 2014-08-12 NOTE — Discharge Instructions (Signed)
SEEK IMMEDIATE MEDICAL ATTENTION IF: New numbness, tingling, weakness, or problem with the use of your arms or legs.  Severe back pain not relieved with medications.  Change in bowel or bladder control.  Increasing pain in any areas of the body (such as chest or abdominal pain).  Shortness of breath, dizziness or fainting.  Nausea (feeling sick to your stomach), vomiting, fever, or sweats.  Radicular Pain Radicular pain in either the arm or leg is usually from a bulging or herniated disk in the spine. A piece of the herniated disk may press against the nerves as the nerves exit the spine. This causes pain which is felt at the tips of the nerves down the arm or leg. Other causes of radicular pain may include:  Fractures.  Heart disease.  Cancer.  An abnormal and usually degenerative state of the nervous system or nerves (neuropathy). Diagnosis may require CT or MRI scanning to determine the primary cause.  Nerves that start at the neck (nerve roots) may cause radicular pain in the outer shoulder and arm. It can spread down to the thumb and fingers. The symptoms vary depending on which nerve root has been affected. In most cases radicular pain improves with conservative treatment. Neck problems may require physical therapy, a neck collar, or cervical traction. Treatment may take many weeks, and surgery may be considered if the symptoms do not improve.  Conservative treatment is also recommended for sciatica. Sciatica causes pain to radiate from the lower back or buttock area down the leg into the foot. Often there is a history of back problems. Most patients with sciatica are better after 2 to 4 weeks of rest and other supportive care. Short term bed rest can reduce the disk pressure considerably. Sitting, however, is not a good position since this increases the pressure on the disk. You should avoid bending, lifting, and all other activities which make the problem worse. Traction can be used in  severe cases. Surgery is usually reserved for patients who do not improve within the first months of treatment. Only take over-the-counter or prescription medicines for pain, discomfort, or fever as directed by your caregiver. Narcotics and muscle relaxants may help by relieving more severe pain and spasm and by providing mild sedation. Cold or massage can give significant relief. Spinal manipulation is not recommended. It can increase the degree of disc protrusion. Epidural steroid injections are often effective treatment for radicular pain. These injections deliver medicine to the spinal nerve in the space between the protective covering of the spinal cord and back bones (vertebrae). Your caregiver can give you more information about steroid injections. These injections are most effective when given within two weeks of the onset of pain.  You should see your caregiver for follow up care as recommended. A program for neck and back injury rehabilitation with stretching and strengthening exercises is an important part of management.  SEEK IMMEDIATE MEDICAL CARE IF:  You develop increased pain, weakness, or numbness in your arm or leg.  You develop difficulty with bladder or bowel control.  You develop abdominal pain. Document Released: 08/04/2004 Document Revised: 09/19/2011 Document Reviewed: 10/20/2008 [facility] Patient Information 2015 [place], [place]. This information is not intended to replace advice given to you by your health care provider. Make sure you discuss any questions you have with your health care provider.

## 2014-08-15 ENCOUNTER — Ambulatory Visit (HOSPITAL_COMMUNITY)
Admission: RE | Admit: 2014-08-15 | Discharge: 2014-08-15 | Disposition: A | Discharge status: Home / Self Care | Payer: Medicare Other | Source: Ambulatory Visit | Attending: Adult Health | Admitting: Adult Health

## 2014-08-15 DIAGNOSIS — I213 ST elevation (STEMI) myocardial infarction of unspecified site: Secondary | ICD-10-CM | POA: Diagnosis not present

## 2014-08-15 DIAGNOSIS — I5022 Chronic systolic (congestive) heart failure: Secondary | ICD-10-CM | POA: Diagnosis present

## 2014-08-15 DIAGNOSIS — C8590 Non-Hodgkin lymphoma, unspecified, unspecified site: Secondary | ICD-10-CM | POA: Insufficient documentation

## 2014-08-15 MED ORDER — WARFARIN SODIUM 5 MG PO TABS: 5.0000 mg | ORAL_TABLET | Freq: Every day | ORAL | Status: DC

## 2014-08-15 MED ORDER — ENOXAPARIN SODIUM 40 MG/0.4ML ~~LOC~~ SOLN: 80.0000 mg | Freq: Two times a day (BID) | SUBCUTANEOUS | Status: DC

## 2014-08-15 NOTE — Progress Notes (Signed)
Patient ID: Andre Tran, male   DOB: [birth year], 54 y.o.   MRN: [id number]  Physicians EP : Dr Lovena Le VA: Dr Jenny Reichmann Oncologist: Dr Alen Blew  PCP: Dr. Maudie Mercury ([facility] [place])  HPI: 54 y.o. 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  He has h/o recurrent lymphoma (1993, 2007) treated with chemo (including adriamycin) - details unclear.  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   Evaluated in [facility] ED 11/02/12 due to dyspnea and later discharged. He had been out of lasix for 1 month. He was restarted on lasix 40 mg daily. CEs negative. Pro BNP 1633.   Admitted 11/5-72/62/03 with A/C systolic HF and cardiogenic shock. Initial co-ox 42%. He was started on Milrinone and PICC placed for home milrinone. Started VAD work up.   Follow up for Heart Failure: Remains on milrinone 0.25 mcg. Overall doing ok. Denies SOB/PND/Orthopnea. Able to walk up steps. Weight at home 161-165 pounds. [facility] with lab draws on Mondays.  Wife also following closely. She says he is taking his meds independently.Uses pill box.   12/12/12 CPX Peak VO2: 16.9 ml/kg/min % predicted peak VO2: 46.9% VE/VCO2 slope: 33.3 OUES: 1.36 Peak RER: 1.19 Moderate to severe functional limitation due to heart failure.  12/16/11 ECHO EF 15% global HK. No apical thrombus.  12/04/12 ECHO EF 15% RV mildly hypokinetic  06/18/14: Echo EF 20-25%, grade III DD, mild MR, mod TR, RV sys fx moderately reduced  Labs 01/16/13 K 4.7 Creatinine 1.1 Labs 03/21/13 Dig level 0.3  Labs 06/20/13: K+ 3.4, Cr 0.94, pro-BNP 3235 Labs: 06/12/14 K 5.3 Creatinine 1.04 Pro BNP 2079. Dig level < 0.3  Labs: 07/21/13: K 5.0 Creatinine 1.57 Labs: 08/11/14: K 4.3 Creatinine 1.14 Magnesium 1.9     SH: Lives with wife in [place]. He is not a smoker or drink alcohol.   ROS: All systems negative except as  listed in HPI, PMH and Problem List.  Past Medical History  Diagnosis Date  . Paroxysmal ventricular tachycardia     s/p AICD '05, replaced w/ St. Jude's in 2010 (PVT/V.Fib arrest requiring ICD implant in 2005)  . HYPERTENSION, UNSPECIFIED   . HYPERLIPIDEMIA-MIXED   . 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 Nonischemic cardiomyopathy     2/2 Adriamycin administration for lymphoma  . CHF (congestive heart failure)     EF 15% by echo 2009; s/p St. Jude ICD  . Diabetes mellitus     Type 2  . Cancer      non hodkins lymphoma 1992, Hodgkins 2007  . Depression   . LV (left ventricular) mural thrombus     2009, on coumadin  . Small bowel obstruction     s/p Small bowel resection 2001  . Jejunal intussusception     2008  . 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 Splenic mass     Noted on Abd [place] 2009 w/ recs for f/u CT - not done  . Hypotension   . Noncompliance with medication regimen   . AICD (automatic cardioverter/defibrillator) present   . Presence of permanent cardiac pacemaker   . Shortness of breath dyspnea     Current Outpatient Prescriptions  Medication Sig Dispense Refill  . albuterol (PROVENTIL HFA;VENTOLIN HFA) 108 (90 BASE) MCG/ACT inhaler Inhale 1-2 puffs into the lungs every 6 (six) hours as needed for wheezing or shortness of breath.    Marland Kitchen atorvastatin (LIPITOR) 40 MG tablet Take 0.5 tablets (20 mg total) by mouth daily. (Patient taking differently: Take 20 mg by mouth at bedtime. ) 30 tablet 6  . digoxin (LANOXIN) 0.125 MG tablet Take 0.125 mg by mouth daily.    . furosemide (LASIX) 40 MG tablet Take 1 tablet (40 mg total) by mouth every other day. (Patient taking differently: Take 40 mg by mouth every other day as needed for fluid. ) 30 tablet   . glipiZIDE (GLUCOTROL) 5 MG tablet Take 5 mg by mouth daily before breakfast.     . hydrALAZINE (APRESOLINE) 10 MG tablet Take 10 mg by mouth 2 (two) times daily.    Marland Kitchen HYDROcodone-acetaminophen (NORCO) 5-325 MG per tablet Take 1-2 tablets by mouth every 6 (six) hours as needed for moderate pain. 20 tablet 0  . insulin glargine (LANTUS) 100 UNIT/ML injection Inject 0.45 mLs (45 Units total) into the skin at bedtime. 10 mL 11  . isosorbide dinitrate (ISORDIL) 5 MG tablet Take 5 mg by mouth 2 (two) times daily.    Marland Kitchen losartan (COZAAR) 100 MG tablet Take 50 mg by mouth at bedtime.     . Magnesium 400 MG TABS Take 1 tablet by mouth daily. (Patient taking differently: Take 1 tablet by mouth daily with breakfast. ) 90 tablet 3  . milrinone (PRIMACOR) 20 MG/100ML SOLN infusion Inject 19.5 mcg/min into the vein continuous. 100 mL 6  . rivaroxaban (XARELTO) 20 MG TABS tablet Take 1 tablet (20 mg total) by mouth daily with supper. 30 tablet 6  . spironolactone (ALDACTONE) 25 MG tablet Take 0.5 tablets (12.5 mg total) by mouth daily. (Patient taking differently: Take 12.5 mg by mouth at bedtime. ) 30 tablet 6   No current facility-administered medications for this encounter.     Filed Vitals:   08/15/14 1044  BP: 86/50  Pulse: 115  Weight: 172 lb 12.8 oz (78.382 kg)  SpO2: 98%   PHYSICAL EXAM: General:  Well appearing.  No resp difficulty Wife present. Milrinone infusing.  HEENT: normal Neck: supple. JVP flat Carotids 2+ bilaterally; no bruits. No lymphadenopathy or thryomegaly appreciated. Cor: PMI normal. Regular rate & rhythm. No rubs, gallops or murmurs. Lungs: CTA Abdomen: soft, nontender,  nondistended. No hepatosplenomegaly. No bruits or masses. Good bowel sounds. Extremities: no cyanosis, clubbing, rash, edema Neuro: alert & orientedx3, cranial nerves grossly intact. Moves all 4 extremities w/o difficulty. Affect pleasant.  ASSESSMENT/ PLAN:  1. Chronic systolic HF: NICM likely due to adriamycin; EF 20-25%, grade III DD, mod TR, RV sys fx mod reduced. (06/2014); ST  Jude ICD.  - NYHA II symptoms on milrinone. Volume status stable.  Continue current dose of lasix.  Can take prn tablet as needed - Not on BB with low out put recently.  - Continue hydralazine 10 mg twice a day. Isosorbide Dinitrate  5 mg twice a day.  - Continue milrinone 0.25 mcg through PICC and continue to get weekly labs. - - Reinforced the need and importance of daily weights, a low sodium diet, and fluid restriction (less than 2 L a day). Instructed to call the HF clinic if weight increases more than 3 lbs overnight or 5 lbs in a week.  2. H/o LV thrombus - Stop Xarelto and start Coumadin with  Lovenox bridge. INR will be managed by HF pharmacist.  3. Non Hodgkins Lymphoma- He has h/o recurrent lymphoma (1993, 2007) treated with chemo (including adriamycin) - Had follow up with Dr. Alen Blew with recommendations for VAD if indicated.    CLEGG,AMY NP-C  08/15/2014 10:55 AM   Patient seen and examined with Darrick Grinder, NP. We discussed all aspects of the encounter. I agree with the assessment and plan as stated above.   HF stable on milrinone. Still struggling with hydralazine dosing. Will continue bid dosing at this point. Will switch Xarelto to coumadin with lovenox bridge. Told him that if he can handle coumadin dosing and keep INRs in therapeutic range will likely be candidate for VAD. Continue milrinone for now.   Demontrae Gilbert,MD 5:18 PM

## 2014-08-15 NOTE — Patient Instructions (Addendum)
START Lovenox 80 mg twice a day, start tomorrow morning START Coumadin 5 mg daily, start tomorrow morning  Your physician recommends that you schedule a follow-up appointment in: 3 weeks  Do the following things EVERYDAY: 1) Weigh yourself in the morning before breakfast. Write it down and keep it in a log. 2) Take your medicines as prescribed 3) Eat low salt foods-Limit salt (sodium) to 2000 mg per day.  4) Stay as active as you can everyday 5) Limit all fluids for the day to less than 2 liters 6)

## 2014-08-18 ENCOUNTER — Encounter: Payer: Self-pay | Admitting: Internal Medicine

## 2014-08-18 ENCOUNTER — Telehealth (HOSPITAL_COMMUNITY): Payer: Self-pay | Admitting: Vascular Surgery

## 2014-08-18 NOTE — Telephone Encounter (Signed)
Verbal order resert 60 days

## 2014-08-18 NOTE — Telephone Encounter (Signed)
Spoke w/Andre Tran gave order to recert pt, she states pt's BP has been running in the 80s, 82/50 today she states pt is asymptomatic but she wanted [place] to be aware it has been low since Friday, will let Dr Haroldine Laws know

## 2014-08-18 NOTE — Telephone Encounter (Signed)
Per Dr Haroldine Laws can d/c hydralazine, will call pt in am

## 2014-08-19 NOTE — Telephone Encounter (Signed)
Pt's wife aware to d/c hydralazine and will remove med from his pill box, also called and left mess on Lakisha's VM of the plan

## 2014-08-20 ENCOUNTER — Ambulatory Visit (HOSPITAL_COMMUNITY): Payer: Self-pay | Admitting: *Deleted

## 2014-08-20 ENCOUNTER — Ambulatory Visit (HOSPITAL_COMMUNITY)
Admission: RE | Admit: 2014-08-20 | Discharge: 2014-08-20 | Disposition: A | Discharge status: Home / Self Care | Payer: Medicare Other | Source: Ambulatory Visit | Attending: Adult Health | Admitting: Adult Health

## 2014-08-20 ENCOUNTER — Other Ambulatory Visit (HOSPITAL_COMMUNITY): Payer: Self-pay | Admitting: *Deleted

## 2014-08-20 DIAGNOSIS — I5022 Chronic systolic (congestive) heart failure: Secondary | ICD-10-CM | POA: Diagnosis present

## 2014-08-20 DIAGNOSIS — Z5181 Encounter for therapeutic drug level monitoring: Secondary | ICD-10-CM

## 2014-08-20 DIAGNOSIS — I513 Intracardiac thrombosis, not elsewhere classified: Secondary | ICD-10-CM

## 2014-08-20 DIAGNOSIS — Z7901 Long term (current) use of anticoagulants: Secondary | ICD-10-CM

## 2014-08-20 DIAGNOSIS — Z95811 Presence of heart assist device: Secondary | ICD-10-CM

## 2014-08-20 LAB — PROTIME-INR
INR: 1.18 (ref 0.00–1.49)
Prothrombin Time: 15.1 seconds (ref 11.6–15.2)

## 2014-08-20 MED ORDER — ENOXAPARIN SODIUM 40 MG/0.4ML ~~LOC~~ SOLN: 80.0000 mg | Freq: Two times a day (BID) | SUBCUTANEOUS | Status: DC

## 2014-08-22 ENCOUNTER — Encounter (HOSPITAL_COMMUNITY): Payer: Medicare Other

## 2014-08-25 ENCOUNTER — Other Ambulatory Visit (HOSPITAL_COMMUNITY): Payer: Medicare Other

## 2014-08-25 ENCOUNTER — Ambulatory Visit (HOSPITAL_COMMUNITY): Payer: Self-pay | Admitting: Infectious Diseases

## 2014-08-25 LAB — PROTIME-INR: INR: 2.7 — AB (ref <=1.1)

## 2014-08-28 ENCOUNTER — Telehealth (HOSPITAL_COMMUNITY): Payer: Self-pay | Admitting: Vascular Surgery

## 2014-08-28 NOTE — Telephone Encounter (Signed)
Attempted to call RN back but VM is full will try again later, pt's Hydralazine was supposed to be stopped 2/8 due to low BP

## 2014-08-28 NOTE — Telephone Encounter (Signed)
Nurse drom [facility] PT BP IS RUNNING 80/50 PULSE 100.Marland Kitchen PLEASE ADVISE

## 2014-08-29 NOTE — Telephone Encounter (Signed)
Spoke w/Lakisha she states pt's BP has continued to be in the 80's HR 100 pt denies any symptoms and is going out running errands with his wife, she states he is much more compliant with low salt diet and wt is pretty stable, will have Dr Haroldine Laws review and get back to her

## 2014-09-01 ENCOUNTER — Ambulatory Visit (HOSPITAL_COMMUNITY): Payer: Self-pay | Admitting: *Deleted

## 2014-09-01 LAB — PROTIME-INR: INR: 3.1 — AB (ref <=1.1)

## 2014-09-03 ENCOUNTER — Encounter: Payer: Self-pay | Admitting: Internal Medicine

## 2014-09-04 ENCOUNTER — Ambulatory Visit (HOSPITAL_COMMUNITY)
Admission: RE | Admit: 2014-09-04 | Discharge: 2014-09-04 | Disposition: A | Discharge status: Home / Self Care | Payer: Medicare Other | Source: Ambulatory Visit | Attending: Cardiology | Admitting: Cardiology

## 2014-09-04 ENCOUNTER — Other Ambulatory Visit: Payer: Self-pay | Admitting: Cardiothoracic Surgery

## 2014-09-04 VITALS — BP 112/62 | HR 66 | Wt 172.4 lb

## 2014-09-04 DIAGNOSIS — I513 Intracardiac thrombosis, not elsewhere classified: Secondary | ICD-10-CM

## 2014-09-04 DIAGNOSIS — I5022 Chronic systolic (congestive) heart failure: Secondary | ICD-10-CM

## 2014-09-04 DIAGNOSIS — I213 ST elevation (STEMI) myocardial infarction of unspecified site: Secondary | ICD-10-CM

## 2014-09-04 DIAGNOSIS — C8590 Non-Hodgkin lymphoma, unspecified, unspecified site: Secondary | ICD-10-CM | POA: Insufficient documentation

## 2014-09-04 MED ORDER — DOCUSATE SODIUM 100 MG PO CAPS: 100.0000 mg | ORAL_CAPSULE | Freq: Two times a day (BID) | ORAL | Status: DC

## 2014-09-04 MED ORDER — ATORVASTATIN CALCIUM 40 MG PO TABS: 20.0000 mg | ORAL_TABLET | Freq: Every day | ORAL | Status: AC

## 2014-09-04 MED ORDER — MAGNESIUM 400 MG PO TABS: 1.0000 | ORAL_TABLET | Freq: Every day | ORAL | Status: DC

## 2014-09-04 MED ORDER — FUROSEMIDE 40 MG PO TABS: 40.0000 mg | ORAL_TABLET | ORAL | Status: DC

## 2014-09-04 MED ORDER — BISACODYL 5 MG PO TBEC: 5.0000 mg | DELAYED_RELEASE_TABLET | Freq: Every day | ORAL | Status: AC | PRN

## 2014-09-04 MED ORDER — SPIRONOLACTONE 25 MG PO TABS: 25.0000 mg | ORAL_TABLET | Freq: Every day | ORAL | Status: DC

## 2014-09-04 NOTE — Progress Notes (Signed)
Patient ID: Andre Tran, male   DOB: [birth year], 54 y.o.   MRN: [id number]  Physicians EP : Dr Lovena Le VA: Dr Jenny Reichmann Oncologist: Dr Alen Blew  PCP: Dr. Maudie Mercury ([facility] [place])  HPI: 54 y.o. 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  He has h/o recurrent lymphoma (1993, 2007) treated with chemo (including adriamycin) - details unclear.  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   Evaluated in [facility] ED 11/02/12 due to dyspnea and later discharged. He had been out of lasix for 1 month. He was restarted on lasix 40 mg daily. CEs negative. Pro BNP 1633.   Admitted 32/9-51/88/41 with A/C systolic HF and cardiogenic shock. Initial co-ox 42%. He was started on Milrinone and PICC placed for home milrinone. Started VAD work up.   Remains on milrinone 0.25 mcg/kg/min. Overall doing ok. No dyspnea on flat ground, short of breath walking up inclines. No orthopnea or PND.  No chest pain.  SBP running down into the 80s with lightheadedness, so hydralazine was stopped.  BP 112/62 today.  He is taking Lasix about every other day. Main complaint is constipation. Weight is stable.   12/12/12 CPX Peak VO2: 16.9 ml/kg/min % predicted peak VO2: 46.9% VE/VCO2 slope: 33.3 OUES: 1.36 Peak RER: 1.19 Moderate to severe functional limitation due to heart failure.  12/16/11 ECHO EF 15% global HK. No apical thrombus.  12/04/12 ECHO EF 15% RV mildly hypokinetic  06/18/14: Echo EF 20-25%, grade III DD, mild MR, mod TR, RV sys fx moderately reduced  Labs 01/16/13 K 4.7 Creatinine 1.1 Labs 03/21/13 Dig level 0.3  Labs 06/20/13: K+ 3.4, Cr 0.94, pro-BNP 3235 Labs: 06/12/14 K 5.3 Creatinine 1.04 Pro BNP 2079. Dig level < 0.3  Labs: 07/21/13: K 5.0 Creatinine 1.57 Labs: 08/11/14: K 4.3 Creatinine 1.14 Magnesium 1.9  SH: Lives with wife in [place]. He is not a  smoker or drink alcohol.   ROS: All systems negative except as listed in HPI, PMH and Problem List.  Past Medical History  Diagnosis Date  . Paroxysmal ventricular tachycardia     s/p AICD '05, replaced w/ St. Jude's in 2010 (PVT/V.Fib arrest requiring ICD implant in 2005)  . HYPERTENSION, UNSPECIFIED   . HYPERLIPIDEMIA-MIXED   . 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 Nonischemic cardiomyopathy     2/2 Adriamycin administration for lymphoma  . CHF (congestive heart failure)     EF 15% by echo 2009; s/p St. Jude ICD  . Diabetes mellitus     Type 2  . Cancer      non hodkins lymphoma 1992, Hodgkins 2007  . Depression   . LV (left ventricular) mural thrombus     2009, on coumadin  . Small bowel obstruction     s/p Small bowel resection 2001  . Jejunal intussusception     2008  . 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 Splenic mass     Noted on Abd [place] 2009 w/ recs for f/u CT - not done  . Hypotension   . Noncompliance with medication regimen   . AICD (automatic cardioverter/defibrillator) present   . Presence of permanent cardiac pacemaker   . Shortness  of breath dyspnea     Current Outpatient Prescriptions  Medication Sig Dispense Refill  . albuterol (PROVENTIL HFA;VENTOLIN HFA) 108 (90 BASE) MCG/ACT inhaler Inhale 1-2 puffs into the lungs every 6 (six) hours as needed for wheezing or shortness of breath.    Marland Kitchen atorvastatin (LIPITOR) 40 MG tablet Take 0.5 tablets (20 mg total) by mouth at bedtime. 30 tablet 6  . digoxin (LANOXIN) 0.125 MG tablet Take 0.125 mg by mouth daily.    . furosemide (LASIX) 40 MG tablet Take 1 tablet (40 mg total) by mouth every other day. 30 tablet   . glipiZIDE (GLUCOTROL) 5 MG tablet Take 5 mg by mouth daily before breakfast.    . HYDROcodone-acetaminophen (NORCO) 5-325 MG per tablet Take 1-2 tablets by  mouth every 6 (six) hours as needed for moderate pain. 20 tablet 0  . insulin glargine (LANTUS) 100 UNIT/ML injection Inject 0.45 mLs (45 Units total) into the skin at bedtime. 10 mL 11  . isosorbide dinitrate (ISORDIL) 5 MG tablet Take 5 mg by mouth 2 (two) times daily.    Marland Kitchen losartan (COZAAR) 100 MG tablet Take 50 mg by mouth at bedtime.     . Magnesium 400 MG TABS Take 1 tablet by mouth daily with breakfast. 90 tablet 3  . milrinone (PRIMACOR) 20 MG/100ML SOLN infusion Inject 19.5 mcg/min into the vein continuous. 100 mL 6  . spironolactone (ALDACTONE) 25 MG tablet Take 1 tablet (25 mg total) by mouth daily. 30 tablet 6  . warfarin (COUMADIN) 5 MG tablet Take 1 tablet (5 mg total) by mouth daily. 30 tablet 3  . bisacodyl (DULCOLAX) 5 MG EC tablet Take 1 tablet (5 mg total) by mouth daily as needed for moderate constipation. 30 tablet 0  . docusate sodium (COLACE) 100 MG capsule Take 1 capsule (100 mg total) by mouth 2 (two) times daily. 10 capsule 0   No current facility-administered medications for this encounter.     Filed Vitals:   09/04/14 1052  BP: 112/62  Pulse: 66  Weight: 172 lb 6.4 oz (78.2 kg)  SpO2: 96%   PHYSICAL EXAM: General:  Well appearing.  No resp difficulty Wife present. Milrinone infusing.  HEENT: normal Neck: supple. JVP flat Carotids 2+ bilaterally; no bruits. No lymphadenopathy or thryomegaly appreciated. Cor: PMI normal. Regular rate & rhythm. No rubs, gallops or murmurs. Lungs: CTA Abdomen: soft, nontender,  nondistended. No hepatosplenomegaly. No bruits or masses. Good bowel sounds. Extremities: no cyanosis, clubbing, rash, edema Neuro: alert & orientedx3, cranial nerves grossly intact. Moves all 4 extremities w/o difficulty. Affect pleasant.  ASSESSMENT/ PLAN:  1. Chronic systolic HF: Nonischemic cardiomyopathy likely due to adriamycin; Last echo with EF 20-25%, grade III DD, mod TR, RV sys fx mod reduced (06/2014). St Jude ICD.  He is now on home  milrinone. NYHA class II symptoms.  He is not volume overloaded on exam, weight is stable.   - Continue milrinone gtt at current dose with weekly labs.  - Continue Lasix prn (uses about every other day).  - Not on beta blocker with low output recently.  - Hydralazine stopped with hypotension, will leave off for now.  - Continue losartan, spironolactone and digoxin.  Will check digoxin level.  - Going forward, he may be an LVAD candidate if he continues to show compliance with meds including coumadin.   - Followup in 1 month to reassess.  2. H/o LV thrombus: He is on coumadin and is compliant with INR followup. 3. Non  Hodgkins Lymphoma: He has h/o recurrent lymphoma (1993, 2007) treated with chemo (including adriamycin). Had follow up with Dr. Alen Blew with recommendations for VAD if indicated.   Loralie Champagne 09/04/2014

## 2014-09-04 NOTE — Telephone Encounter (Signed)
Pt seen in clinic today and BP good no changes were made

## 2014-09-04 NOTE — Patient Instructions (Signed)
Start Colace 100 mg Twice daily, you can get this over the counter  Start Dulcolax 5 mg daily as needed for constipation, this also is over the counter  Your physician recommends that you schedule a follow-up appointment in: 1 month

## 2014-09-06 ENCOUNTER — Other Ambulatory Visit: Payer: Self-pay | Admitting: Physician Assistant

## 2014-09-06 ENCOUNTER — Telehealth: Payer: Self-pay | Admitting: Physician Assistant

## 2014-09-06 DIAGNOSIS — I5022 Chronic systolic (congestive) heart failure: Secondary | ICD-10-CM

## 2014-09-06 DIAGNOSIS — I513 Intracardiac thrombosis, not elsewhere classified: Secondary | ICD-10-CM

## 2014-09-06 MED ORDER — WARFARIN SODIUM 5 MG PO TABS: 5.0000 mg | ORAL_TABLET | Freq: Every day | ORAL | Status: DC

## 2014-09-06 NOTE — Telephone Encounter (Signed)
Mrs Granquist called because Mr Fulgham was out of his coumadin. His dose was increased and insurance will not cover a refill yet.  Sent in a prescription for an increased number of tablets.  Rosaria Ferries, PA-C 09/06/2014 1:12 PM Beeper [phone]

## 2014-09-08 ENCOUNTER — Institutional Professional Consult (permissible substitution) (INDEPENDENT_AMBULATORY_CARE_PROVIDER_SITE_OTHER): Payer: Medicare Other | Admitting: Cardiothoracic Surgery

## 2014-09-08 ENCOUNTER — Encounter: Payer: Self-pay | Admitting: Cardiothoracic Surgery

## 2014-09-08 ENCOUNTER — Emergency Department (HOSPITAL_COMMUNITY)
Admission: EM | Admit: 2014-09-08 | Discharge: 2014-09-08 | Disposition: A | Discharge status: Home / Self Care | Payer: Medicare Other | Attending: Emergency Medicine | Admitting: Emergency Medicine

## 2014-09-08 ENCOUNTER — Encounter (HOSPITAL_COMMUNITY): Payer: Self-pay

## 2014-09-08 ENCOUNTER — Ambulatory Visit (HOSPITAL_COMMUNITY): Payer: Self-pay | Admitting: *Deleted

## 2014-09-08 ENCOUNTER — Telehealth (HOSPITAL_COMMUNITY): Payer: Self-pay | Admitting: Vascular Surgery

## 2014-09-08 ENCOUNTER — Ambulatory Visit
Admission: RE | Admit: 2014-09-08 | Discharge: 2014-09-08 | Disposition: A | Discharge status: Home / Self Care | Payer: Medicare Other | Source: Ambulatory Visit | Attending: Cardiothoracic Surgery | Admitting: Cardiothoracic Surgery

## 2014-09-08 VITALS — BP 91/64 | HR 100 | Resp 20 | Ht 67.0 in | Wt 162.0 lb

## 2014-09-08 DIAGNOSIS — Z95810 Presence of automatic (implantable) cardiac defibrillator: Secondary | ICD-10-CM | POA: Diagnosis not present

## 2014-09-08 DIAGNOSIS — Z8659 Personal history of other mental and behavioral disorders: Secondary | ICD-10-CM | POA: Insufficient documentation

## 2014-09-08 DIAGNOSIS — I509 Heart failure, unspecified: Secondary | ICD-10-CM | POA: Insufficient documentation

## 2014-09-08 DIAGNOSIS — Z7901 Long term (current) use of anticoagulants: Secondary | ICD-10-CM | POA: Insufficient documentation

## 2014-09-08 DIAGNOSIS — Z79899 Other long term (current) drug therapy: Secondary | ICD-10-CM | POA: Diagnosis not present

## 2014-09-08 DIAGNOSIS — Z8673 Personal history of transient ischemic attack (TIA), and cerebral infarction without residual deficits: Secondary | ICD-10-CM | POA: Diagnosis not present

## 2014-09-08 DIAGNOSIS — R7989 Other specified abnormal findings of blood chemistry: Secondary | ICD-10-CM | POA: Diagnosis not present

## 2014-09-08 DIAGNOSIS — E782 Mixed hyperlipidemia: Secondary | ICD-10-CM | POA: Insufficient documentation

## 2014-09-08 DIAGNOSIS — Z794 Long term (current) use of insulin: Secondary | ICD-10-CM | POA: Insufficient documentation

## 2014-09-08 DIAGNOSIS — E119 Type 2 diabetes mellitus without complications: Secondary | ICD-10-CM | POA: Insufficient documentation

## 2014-09-08 DIAGNOSIS — Z8719 Personal history of other diseases of the digestive system: Secondary | ICD-10-CM | POA: Diagnosis not present

## 2014-09-08 DIAGNOSIS — Z859 Personal history of malignant neoplasm, unspecified: Secondary | ICD-10-CM | POA: Insufficient documentation

## 2014-09-08 DIAGNOSIS — I5022 Chronic systolic (congestive) heart failure: Secondary | ICD-10-CM

## 2014-09-08 DIAGNOSIS — I513 Intracardiac thrombosis, not elsewhere classified: Secondary | ICD-10-CM

## 2014-09-08 DIAGNOSIS — I10 Essential (primary) hypertension: Secondary | ICD-10-CM | POA: Insufficient documentation

## 2014-09-08 DIAGNOSIS — R000 Tachycardia, unspecified: Secondary | ICD-10-CM | POA: Diagnosis not present

## 2014-09-08 DIAGNOSIS — R791 Abnormal coagulation profile: Secondary | ICD-10-CM

## 2014-09-08 LAB — CBC WITH DIFFERENTIAL/PLATELET
Basophils Absolute: 0 10*3/uL (ref 0.0–0.1)
Basophils Relative: 0 % (ref 0–1)
Eosinophils Absolute: 0.2 10*3/uL (ref 0.0–0.7)
Eosinophils Relative: 2 % (ref 0–5)
HCT: 31.7 % — ABNORMAL LOW (ref 39.0–52.0)
Hemoglobin: 11.1 g/dL — ABNORMAL LOW (ref 13.0–17.0)
Lymphocytes Relative: 12 % (ref 12–46)
Lymphs Abs: 1.3 10*3/uL (ref 0.7–4.0)
MCH: 29.6 pg (ref 26.0–34.0)
MCHC: 35 g/dL (ref 30.0–36.0)
MCV: 84.5 fL (ref 78.0–100.0)
Monocytes Absolute: 1.1 10*3/uL — ABNORMAL HIGH (ref 0.1–1.0)
Monocytes Relative: 10 % (ref 3–12)
Neutro Abs: 8.1 10*3/uL — ABNORMAL HIGH (ref 1.7–7.7)
Neutrophils Relative %: 76 % (ref 43–77)
Platelets: 270 10*3/uL (ref 150–400)
RBC: 3.75 MIL/uL — ABNORMAL LOW (ref 4.22–5.81)
RDW: 14.3 % (ref 11.5–15.5)
WBC: 10.8 10*3/uL — ABNORMAL HIGH (ref 4.0–10.5)

## 2014-09-08 LAB — BASIC METABOLIC PANEL
Anion gap: 6 (ref 5–15)
BUN: 26 mg/dL — ABNORMAL HIGH (ref 6–23)
CO2: 28 mmol/L (ref 19–32)
Calcium: 9.3 mg/dL (ref 8.4–10.5)
Chloride: 99 mmol/L (ref 96–112)
Creatinine, Ser: 1.17 mg/dL (ref 0.50–1.35)
GFR calc Af Amer: 82 mL/min — ABNORMAL LOW (ref >90)
GFR calc non Af Amer: 71 mL/min — ABNORMAL LOW (ref >90)
Glucose, Bld: 49 mg/dL — ABNORMAL LOW (ref 70–99)
Potassium: 4.1 mmol/L (ref 3.5–5.1)
Sodium: 133 mmol/L — ABNORMAL LOW (ref 135–145)

## 2014-09-08 LAB — TYPE AND SCREEN
ABO/RH(D): O POS
Antibody Screen: NEGATIVE

## 2014-09-08 LAB — POC OCCULT BLOOD, ED: Fecal Occult Bld: NEGATIVE

## 2014-09-08 LAB — PROTIME-INR: INR: 4.4 — AB (ref <=1.1)

## 2014-09-08 NOTE — Telephone Encounter (Signed)
Unable to reach patient despite multiple attempts.  RN number left in message is not correct (dead tone when dialed).  Reached patient's souse and informed her of critical lab of Hgb of 6.8.  Along with INR of 4.4, possible active bleed.  Advised to have patient go to emergency room immediately.  Aware and agreeable.  Renee Pain

## 2014-09-08 NOTE — ED Notes (Signed)
Pt here for abnormal labs sent from dr bensimohn office. Due for LVAD in 5 weeks. Told INR was high.

## 2014-09-08 NOTE — Discharge Instructions (Signed)
Warfarin: What You Need to Know Warfarin is an anticoagulant. Anticoagulants help prevent the formation of blood clots. They also help stop the growth of blood clots. Warfarin is sometimes referred to as a "blood thinner."  Normally, when body tissues are cut or damaged, the blood clots in order to prevent blood loss. Sometimes clots form inside your blood vessels and obstruct the flow of blood through your circulatory system (thrombosis). These clots may travel through your bloodstream and become lodged in smaller blood vessels in your brain, which can cause a stroke, or in your lungs (pulmonary embolism). WHO SHOULD USE WARFARIN? Warfarin is prescribed for people at risk of developing harmful blood clots:  People with surgically implanted mechanical heart valves, irregular heart rhythms called atrial fibrillation, and certain clotting disorders.  People who have developed harmful blood clotting in the past, including those who have had a stroke or a pulmonary embolism, or thrombosis in their legs (deep vein thrombosis [DVT]).  People with an existing blood clot, such as a pulmonary embolism. WARFARIN DOSING Warfarin tablets come in different strengths. Each tablet strength is a different color, with the amount of warfarin (in milligrams) clearly printed on the tablet. If the color of your tablet is different than usual when you receive a new prescription, report it immediately to your pharmacist or health care provider. WARFARIN MONITORING The goal of warfarin therapy is to lessen the clotting tendency of blood but not prevent clotting completely. Your health care provider will monitor the anticoagulation effect of warfarin closely and adjust your dose as needed. For your safety, blood tests called prothrombin time (PT) or international normalized ratio (INR) are used to measure the effects of warfarin. Both of these tests can be done with a finger stick or a blood draw. The longer it takes the  blood to clot, the higher the PT or INR. Your health care provider will inform you of your "target" PT or INR range. If, at any time, your PT or INR is above the target range, there is a risk of bleeding. If your PT or INR is below the target range, there is a risk of clotting. Whether you are started on warfarin while you are in the hospital or in your health care provider's office, you will need to have your PT or INR checked within one week of starting the medicine. Initially, some people are asked to have their PT or INR checked as much as twice a week. Once you are on a stable maintenance dose, the PT or INR is checked less often, usually once every 2 to 4 weeks. The warfarin dose may be adjusted if the PT or INR is not within the target range. It is important to keep all laboratory and health care provider follow-up appointments. Not keeping appointments could result in a chronic or permanent injury, pain, or disability because warfarin is a medicine that requires close monitoring. WHAT ARE THE SIDE EFFECTS OF WARFARIN?  Too much warfarin can cause bleeding (hemorrhage) from any part of the body. This may include bleeding from the gums, blood in the urine, bloody or dark stools, a nosebleed that is not easily stopped, coughing up blood, or vomiting blood.  Too little warfarin can increase the risk of blood clots.  Too little or too much warfarin can also increase the risk of a stroke.  Warfarin use may cause a skin rash or irritation, an unusual fever, continual nausea or stomach upset, or severe pain in your joints or back.   SPECIAL PRECAUTIONS WHILE TAKING WARFARIN Warfarin should be taken exactly as directed. It is very important to take warfarin as directed since bleeding or blood clots could result in chronic or permanent injury, pain, or disability.  Take your medicine at the same time every day. If you forget to take your dose, you can take it if it is within 6 hours of when it was  due.  Do not change the dose of warfarin on your own to make up for missed or extra doses.  If you miss more than 2 doses in a row, you should contact your health care provider for advice. Avoid situations that cause bleeding. You may have a tendency to bleed more easily than usual while taking warfarin. The following actions can limit bleeding:  Using a softer toothbrush.  Flossing with waxed floss rather than unwaxed floss.  Shaving with an electric razor rather than a blade.  Limiting the use of sharp objects.  Avoiding potentially harmful activities, such as contact sports. Warfarin and Pregnancy or Breastfeeding  Warfarin is not advised during the first trimester of pregnancy due to an increased risk of birth defects. In certain situations, a woman may take warfarin after her first trimester of pregnancy. A woman who becomes pregnant or plans to become pregnant while taking warfarin should notify her health care provider immediately.  Although warfarin does not pass into breast milk, a woman who wishes to breastfeed while taking warfarin should also consult with her health care provider. Alcohol, Smoking, and Illicit Drug Use  Alcohol affects how warfarin works in the body. It is best to avoid alcoholic drinks or consume very small amounts while taking warfarin. In general, alcohol intake should be limited to 1 oz (30 mL) of liquor, 6 oz (180 mL) of wine, or 12 oz (360 mL) of beer each day. Notify your health care provider if you change your alcohol intake.  Smoking affects how warfarin works. It is best to avoid smoking while taking warfarin. Notify your health care provider if you change your smoking habits.  It is best to avoid all illicit drugs while taking warfarin since there are few studies that show how warfarin interacts with these drugs. Other Medicines and Dietary Supplements Many prescription and over-the-counter medicines can interfere with warfarin. Be sure all of your  health care providers know you are taking warfarin. Notify your health care provider who prescribed warfarin for you or your pharmacist before starting or stopping any new medicines, including over-the-counter vitamins, dietary supplements, and pain medicines. Your warfarin dose may need to be adjusted. Some common over-the-counter medicines that may increase the risk of bleeding while taking warfarin include:   Acetaminophen.  Aspirin.  Nonsteroidal anti-inflammatory medicines (NSAIDs), such as ibuprofen or naproxen.  Vitamin E. Dietary Considerations  Foods that have moderate or high amounts of vitamin K can interfere with warfarin. Avoid major changes in your diet or notify your health care provider before changing your diet. Eat a consistent amount of foods that have moderate or high amounts of vitamin K. Eating less foods containing vitamin K can increase the risk of bleeding. Eating more foods containing vitamin K can increase the risk of blood clots. Additional questions about dietary considerations can be discussed with a dietitian. Foods that are very high in vitamin K:  Greens, such as Swiss chard and beet, collard, mustard, or turnip greens (fresh or frozen, cooked).  Kale (fresh or frozen, cooked).  Parsley (raw).  Spinach (cooked). Foods that are high   in vitamin K:  Asparagus (frozen, cooked).  Beans, green (frozen, cooked).  Broccoli.  Bok choy (cooked).  Brussels sprouts (fresh or frozen, cooked).  Cabbage (cooked).   Coleslaw. Foods that are moderately high in vitamin K:  Blueberries.  Black-eyed peas.  Endive (raw).  Green leaf lettuce (raw).  Green scallions (raw).  Kale (raw).  Okra (frozen, cooked).  Plantains (fried).  Romaine lettuce (raw).  Sauerkraut (canned).  Spinach (raw). CALL YOUR CLINIC OR HEALTH CARE PROVIDER IF YOU:  Plan to have any surgery or procedure.  Feel sick, especially if you have diarrhea or  vomiting.  Experience or anticipate any major changes in your diet.  Start or stop a prescription or over-the-counter medicine.  Become, plan to become, or think you may be pregnant.  Are having heavier than usual menstrual periods.  Have had a fall, accident, or any symptoms of bleeding or unusual bruising.  Develop an unusual fever. CALL 911 IN THE [place] OR GO TO THE EMERGENCY DEPARTMENT IF YOU:   Think you may be having an allergic reaction to warfarin. The signs of an allergic reaction could include itching, rash, hives, swelling, chest tightness, or trouble breathing.  See signs of blood in your urine. The signs could include reddish, pinkish, or tea-colored urine.  See signs of blood in your stools. The signs could include bright red or black stools.  Vomit or cough up blood. In these instances, the blood could have either a bright red or a "coffee-grounds" appearance.  Have bleeding that will not stop after applying pressure for 30 minutes such as cuts, nosebleeds, or other injuries.  Have severe pain in your joints or back.  Have a new and severe headache.  Have sudden weakness or numbness of your face, arm, or leg, especially on one side of your body.  Have sudden confusion or trouble understanding.  Have sudden trouble seeing in one or both eyes.  Have sudden trouble walking, dizziness, loss of balance, or coordination.  Have trouble speaking or understanding (aphasia). Document Released: 06/27/2005 Document Revised: 11/11/2013 Document Reviewed: 12/21/2012 [employer] Patient Information 2015 [employer]. This information is not intended to replace advice given to you by your health care provider. Make sure you discuss any questions you have with your health care provider.  

## 2014-09-08 NOTE — Progress Notes (Signed)
PCP is Rande Brunt, NP Referring Provider is Rande Brunt, NP  Chief Complaint  Patient presents with  . 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 The patient was recently started on Coumadin to confirm that he would have no problems with significant GI bleeding while taking chronic Coumadin which would be required for LVAD implantation. The patient had a screening colonoscopy approximately one year ago at the [facility] in [place] which was clear. He has had no evidence of GI bleeding on Coumadin over the past 3 weeks. His heart failure symptoms have been fairly well controlled on continuous IV milrinone.his weight has been stable. He complains mainly of musculoskeletal lower back pain.   Last year the patient underwent neuropsych evaluation at the [facility] medical clinic and was found have some mild problems with impulse control and phonemic speech and short-term memory. The patient's wife direct his medication administration and also directs  financial decisions for the family. Past Medical History  Diagnosis Date  . Paroxysmal ventricular tachycardia     s/p AICD '05, replaced w/ St. Jude's in 2010 (PVT/V.Fib arrest requiring ICD implant in 2005)  . HYPERTENSION, UNSPECIFIED   . HYPERLIPIDEMIA-MIXED   . 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 Nonischemic cardiomyopathy     2/2 Adriamycin administration for lymphoma  . CHF  (congestive heart failure)     EF 15% by echo 2009; s/p St. Jude ICD  . Diabetes mellitus     Type 2  . Cancer      non hodkins lymphoma 1992, Hodgkins 2007  . Depression   . LV (left ventricular) mural thrombus     2009, on coumadin  . Small bowel obstruction     s/p Small bowel resection 2001  . Jejunal intussusception     2008  . 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 Splenic mass     Noted on Abd [place] 2009 w/ recs for f/u CT - not done  . Hypotension   . Noncompliance with medication regimen   . AICD (automatic cardioverter/defibrillator) present   . Presence of permanent cardiac pacemaker   . Shortness of breath dyspnea     Past Surgical History  Procedure Laterality Date  . Cardiac defibrillator placement      2005, replaced w/ St. Jude's 2010  . Vasectomy    . Bowel resection      small bowell 2/2 obstruction 2001  . Pacemaker insertion    . Insert / replace / remove pacemaker      Family History  Problem Relation Age of Onset  . Other      No known family h/o heart disease  . Heart disease    . Diabetes Mother   . Other Other     complications from hip replacement    Social History History  Substance Use Topics  . Smoking status: Never Smoker   . Smokeless tobacco: Never Used  . Alcohol Use: 1.2 oz/week  1 Cans of beer, 1 Shots of liquor per week     Comment: ON SPECIAL OCCASIONS    Current Outpatient Prescriptions  Medication Sig Dispense Refill  . albuterol (PROVENTIL HFA;VENTOLIN HFA) 108 (90 BASE) MCG/ACT inhaler Inhale 1-2 puffs into the lungs every 6 (six) hours as needed for wheezing or shortness of breath.    Marland Kitchen atorvastatin (LIPITOR) 40 MG tablet Take 0.5 tablets (20 mg total) by mouth at bedtime. 30 tablet 6  . bisacodyl (DULCOLAX) 5 MG EC tablet Take 1 tablet (5 mg total) by mouth daily as needed for moderate constipation. 30 tablet 0  . digoxin (LANOXIN) 0.125 MG tablet Take 0.125 mg by mouth daily.     Marland Kitchen docusate sodium (COLACE) 100 MG capsule Take 1 capsule (100 mg total) by mouth 2 (two) times daily. 10 capsule 0  . furosemide (LASIX) 40 MG tablet Take 1 tablet (40 mg total) by mouth every other day. 30 tablet   . glipiZIDE (GLUCOTROL) 5 MG tablet Take 5 mg by mouth daily before breakfast.    . insulin glargine (LANTUS) 100 UNIT/ML injection Inject 0.45 mLs (45 Units total) into the skin at bedtime. 10 mL 11  . isosorbide dinitrate (ISORDIL) 5 MG tablet Take 5 mg by mouth 2 (two) times daily.    Marland Kitchen losartan (COZAAR) 100 MG tablet Take 50 mg by mouth at bedtime.     . Magnesium 400 MG TABS Take 1 tablet by mouth daily with breakfast. 90 tablet 3  . milrinone (PRIMACOR) 20 MG/100ML SOLN infusion Inject 19.5 mcg/min into the vein continuous. 100 mL 6  . spironolactone (ALDACTONE) 25 MG tablet Take 1 tablet (25 mg total) by mouth daily. 30 tablet 6  . warfarin (COUMADIN) 5 MG tablet Take 1 tablet (5 mg total) by mouth daily. Or as directed. 45 tablets should be a 30-day supply 45 tablet 3   No current facility-administered medications for this visit.    No Known Allergies  Review of Systems  GI- The patient has had 2 previous laparotomies. The first was a low transverse incision for lymph node biopsy to establish the diagnosis of non-Hodgkin's lymphoma about 20 years ago. The second laparotomy was for small bowel obstruction through a lower midline incision.  Gen.-weight has been stable no fever Pulmonary-no productive cough or chest pain Cardiac-AICD has not discharged in over 2 years Endocrine-patient states blood sugars have been well controlled Musculoskeletal-complains of low back pain radiating into the legs, still able to walk without difficulty Neurologic-no new symptoms chronic right-sided weakness from old stroke ENT-no dental complaints no difficult swallowing  BP 91/64 mmHg  Pulse 100  Resp 20  Ht 5\' 7"  (1.702 m)  Wt 162 lb (73.483 kg)  BMI 25.37 kg/m2  SpO2 95%    Physical Exam Gen.-middle-aged AA male coming by wife in no acute distress HEENT-normocephalic pupils equal dentition good Neck-supple without JVD mass or bruit Lymphatics-no palpable cervical nodes, no supraclavicular nodes or axillary nodes Thorax-breath sounds clear Cardiac-increased heart rate 108 per minute, no murmur or gallop, peripheral pulses 1+ Abdomen-well-healed surgical scar, no palpable mass or tenderness Extremities-no clubbing cyanosis edema Neurologic-patient has a limp from the old stroke and right-sided weakness and mild expressive aphasia  Diagnostic Tests: Chest x-ray shows mild interstitial edema, no infiltrate or effusion  Impression: 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  Plan:continue close surveillance advanced heart failure clinic Plan for elective admission and implantation of HeartMate 2 LVAD in the  near future.

## 2014-09-08 NOTE — Telephone Encounter (Signed)
[facility] called Critical lab hemoglobin 6.8

## 2014-09-08 NOTE — ED Provider Notes (Signed)
CSN: [id number]     Arrival date & time 09/08/14  1437 History   First MD Initiated Contact with Patient 09/08/14 1535     Chief Complaint  Patient presents with  . Abnormal Lab     (Consider location/radiation/quality/duration/timing/severity/associated sxs/prior Treatment) HPI  Pt is a 54yo male with hx of CHF, CVA, nonischemic cardiomyopathy, HTN, hyperlipidemia, type II IDDM, AICD, presenting to ED as advised by Dr. Damaris Schooner office with reports of INR of 4.4 and Hgb of 6.8 by his home health nurse.  Pt denies any symptoms including chest pain, SOB, nausea, vomiting, hematuria or blood in stool.  Pt's wife does state pt ate a lot of green beans the other day and believe this is the cause of significant change in his INR as he has been taking his coumadin as prescribed.  Pt is due for LVAD in 5 weeks.   Past Medical History  Diagnosis Date  . Paroxysmal ventricular tachycardia     s/p AICD '05, replaced w/ St. Jude's in 2010 (PVT/V.Fib arrest requiring ICD implant in 2005)  . HYPERTENSION, UNSPECIFIED   . HYPERLIPIDEMIA-MIXED   . 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 Nonischemic cardiomyopathy     2/2 Adriamycin administration for lymphoma  . CHF (congestive heart failure)     EF 15% by echo 2009; s/p St. Jude ICD  . Diabetes mellitus     Type 2  . Cancer      non hodkins lymphoma 1992, Hodgkins 2007  . Depression   . LV (left ventricular) mural thrombus     2009, on coumadin  . Small bowel obstruction     s/p Small bowel resection 2001  . Jejunal intussusception     2008  . 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 Splenic mass     Noted on Abd [place] 2009 w/ recs for f/u CT - not done  . Hypotension   . Noncompliance with medication regimen   . AICD (automatic cardioverter/defibrillator) present   . Presence of permanent  cardiac pacemaker   . Shortness of breath dyspnea    Past Surgical History  Procedure Laterality Date  . Cardiac defibrillator placement      2005, replaced w/ St. Jude's 2010  . Vasectomy    . Bowel resection      small bowell 2/2 obstruction 2001  . Pacemaker insertion    . Insert / replace / remove pacemaker     Family History  Problem Relation Age of Onset  . Other      No known family h/o heart disease  . Heart disease    . Diabetes Mother   . Other Other     complications from hip replacement   History  Substance Use Topics  . Smoking status: Never Smoker   . Smokeless tobacco: Never Used  . Alcohol Use: 1.2 oz/week    1 Cans of beer, 1 Shots of liquor per week     Comment: ON SPECIAL OCCASIONS    Review of Systems  Constitutional: Negative for fever and chills.  Respiratory: Negative for cough and shortness of breath.   Cardiovascular: Negative for chest pain and palpitations.  Gastrointestinal: Negative for nausea, vomiting and blood in stool.  Genitourinary: Negative for hematuria.  Neurological: Negative for dizziness, syncope, light-headedness and headaches.  All other systems  reviewed and are negative.     Allergies  Review of patient's allergies indicates no known allergies.  Home Medications   Prior to Admission medications   Medication Sig Start Date End Date Taking? Authorizing Provider  albuterol (PROVENTIL HFA;VENTOLIN HFA) 108 (90 BASE) MCG/ACT inhaler Inhale 1-2 puffs into the lungs every 6 (six) hours as needed for wheezing or shortness of breath.   Yes Historical Provider, MD  atorvastatin (LIPITOR) 40 MG tablet Take 0.5 tablets (20 mg total) by mouth at bedtime. 09/04/14  Yes Larey Dresser, MD  digoxin (LANOXIN) 0.125 MG tablet Take 0.125 mg by mouth daily.   Yes Historical Provider, MD  furosemide (LASIX) 40 MG tablet Take 1 tablet (40 mg total) by mouth every other day. 09/04/14  Yes Larey Dresser, MD  glipiZIDE (GLUCOTROL) 5 MG tablet  Take 5 mg by mouth daily before breakfast.   Yes Historical Provider, MD  insulin glargine (LANTUS) 100 UNIT/ML injection Inject 0.45 mLs (45 Units total) into the skin at bedtime. 06/23/14  Yes Amy D Clegg, NP  isosorbide dinitrate (ISORDIL) 5 MG tablet Take 5 mg by mouth 2 (two) times daily.   Yes Historical Provider, MD  losartan (COZAAR) 100 MG tablet Take 50 mg by mouth at bedtime.    Yes Historical Provider, MD  Magnesium 400 MG TABS Take 1 tablet by mouth daily with breakfast. 09/04/14  Yes Larey Dresser, MD  milrinone [facility]) 20 MG/100ML SOLN infusion Inject 19.5 mcg/min into the vein continuous. 06/23/14  Yes Amy D Clegg, NP  spironolactone (ALDACTONE) 25 MG tablet Take 1 tablet (25 mg total) by mouth daily. 09/04/14  Yes Larey Dresser, MD  warfarin (COUMADIN) 5 MG tablet Take 1 tablet (5 mg total) by mouth daily. Or as directed. 45 tablets should be a 30-day supply 09/06/14  Yes Rhonda G Barrett, PA-C  bisacodyl (DULCOLAX) 5 MG EC tablet Take 1 tablet (5 mg total) by mouth daily as needed for moderate constipation. Patient not taking: Reported on 09/08/2014 09/04/14   Larey Dresser, MD  docusate sodium (COLACE) 100 MG capsule Take 1 capsule (100 mg total) by mouth 2 (two) times daily. Patient not taking: Reported on 09/08/2014 09/04/14   Larey Dresser, MD   BP 114/68 mmHg  Pulse 108  Temp(Src) 98.4 F (36.9 C) (Oral)  Resp 15  Ht 5\' 7"  (1.702 m)  Wt 162 lb (73.483 kg)  BMI 25.37 kg/m2  SpO2 100% Physical Exam  Constitutional: He appears well-developed and well-nourished.  Pt lying comfortably in exam bed, NAD.   HENT:  Head: Normocephalic and atraumatic.  Eyes: Conjunctivae are normal. No scleral icterus.  Neck: Normal range of motion.  Cardiovascular: Regular rhythm and normal heart sounds.  Tachycardia present.   Mild tachycardia, regular rhythm  Pulmonary/Chest: Effort normal and breath sounds normal. No respiratory distress. He has no wheezes. He has no rales. He  exhibits no tenderness.  Lungs: CTAB  Abdominal: Soft. Bowel sounds are normal. He exhibits no distension and no mass. There is no tenderness. There is no rebound and no guarding.  Genitourinary: Guaiac negative stool.  Chaperoned exam. Rectal exam: normal sphincter tone. Soft brown stool. No frank red blood.  Musculoskeletal: Normal range of motion.  Neurological: He is alert.  Skin: Skin is warm and dry.  Nursing note and vitals reviewed.   ED Course  Procedures (including critical care time) Labs Review Labs Reviewed  CBC WITH DIFFERENTIAL/PLATELET - Abnormal; Notable for the following:  WBC 10.8 (*)    RBC 3.75 (*)    Hemoglobin 11.1 (*)    HCT 31.7 (*)    Neutro Abs 8.1 (*)    Monocytes Absolute 1.1 (*)    All other components within normal limits  BASIC METABOLIC PANEL - Abnormal; Notable for the following:    Sodium 133 (*)    Glucose, Bld 49 (*)    BUN 26 (*)    GFR calc non Af Amer 71 (*)    GFR calc Af Amer 82 (*)    All other components within normal limits  POC OCCULT BLOOD, ED  TYPE AND SCREEN    Imaging Review Dg Chest 2 View  09/08/2014   CLINICAL DATA:  Currently asymptomatic, known mural thrombus and previous history of lymphoma  EXAM: CHEST  2 VIEW  COMPARISON:  Portable chest x-ray of June 19, 2014  FINDINGS: The lungs are adequately inflated and clear. The heart and pulmonary vascularity are normal. There is no pleural effusion or pneumothorax. The bony thorax is unremarkable. The permanent pacemaker defibrillator is in appropriate position radiographically. The right-sided PICC line tip projects over the proximal portion of the SVC.  IMPRESSION: There is no active cardiopulmonary disease.   Electronically Signed   By: David  Martinique   On: 09/08/2014 13:02     EKG Interpretation None      MDM   Final diagnoses:  Elevated INR    Pt is a 54yo male sent to ED by Dr. Damaris Schooner office for further evaluation of INR of 4.4 and Hgb by home health  nurse of 6.8.  Will repeat CBC and BMP as well as test hemoccult.    Hemoccult: negative  Hgb: 11.1.  Glucose is 49, however, pt states he has not eaten since this morning. States he is planning on going to "all you can eat crab legs" with his family.   Discussed pt with Dr. Vanita Panda who also examined pt. Agrees pt is hemodynamically stable for close f/u with PCP and Dr. Jeffie Pollock.   Return precautions provided. Pt and wife verbalized understanding and agreement with tx plan.   Noland Fordyce, PA-C 09/08/14 1753  Carmin Muskrat, MD 09/09/14 [phone]

## 2014-09-09 ENCOUNTER — Telehealth (HOSPITAL_COMMUNITY): Payer: Self-pay | Admitting: *Deleted

## 2014-09-09 ENCOUNTER — Ambulatory Visit (HOSPITAL_COMMUNITY): Payer: Self-pay | Admitting: *Deleted

## 2014-09-09 NOTE — Telephone Encounter (Signed)
Called pt's wife per Dr. Haroldine Laws and discussed INR goal change due to decreased Hgb and suspected bleed.  New coumadin instructions given (see anticoag note); pt will have INR checked this Friday per [facility] nurse. Pt will need scheduled admission for heparin bridge for EGD/colonoscopy. Wife agreed to same; will discuss timing with Dr. Shellia Carwin and call wife with date.

## 2014-09-10 ENCOUNTER — Telehealth (HOSPITAL_COMMUNITY): Payer: Self-pay | Admitting: *Deleted

## 2014-09-10 ENCOUNTER — Encounter: Payer: Self-pay | Admitting: Physician Assistant

## 2014-09-10 NOTE — Telephone Encounter (Signed)
Called pt's wife re: scheduled admission Monday 09/15/14 to Dr. Haroldine Laws for anemia. Planned GI consult for colonoscopy/endoscopy with Heparin bridge.  Admissions will notify pt/wife Monday when to report to hospital.  Reminded wife, [name] will call Friday - 09/12/14 with coumadin dosing instructions after Home Health INR results are reviewed with HF team. Wife verbalized understanding of same.

## 2014-09-12 ENCOUNTER — Ambulatory Visit (HOSPITAL_COMMUNITY): Payer: Self-pay | Admitting: Infectious Diseases

## 2014-09-12 LAB — PROTIME-INR: INR: 2.1 — AB (ref <=1.1)

## 2014-09-15 ENCOUNTER — Other Ambulatory Visit (HOSPITAL_COMMUNITY): Payer: Self-pay | Admitting: Adult Health

## 2014-09-15 ENCOUNTER — Encounter (HOSPITAL_COMMUNITY): Payer: Self-pay

## 2014-09-15 ENCOUNTER — Inpatient Hospital Stay (HOSPITAL_COMMUNITY)
Admission: AD | Admit: 2014-09-15 | Discharge: 2014-09-25 | DRG: 287 | Disposition: A | Discharge status: Home / Self Care | Payer: Medicare Other | Source: Ambulatory Visit | Attending: Internal Medicine | Admitting: Internal Medicine

## 2014-09-15 DIAGNOSIS — Z794 Long term (current) use of insulin: Secondary | ICD-10-CM

## 2014-09-15 DIAGNOSIS — D649 Anemia, unspecified: Secondary | ICD-10-CM | POA: Diagnosis not present

## 2014-09-15 DIAGNOSIS — Z95810 Presence of automatic (implantable) cardiac defibrillator: Secondary | ICD-10-CM

## 2014-09-15 DIAGNOSIS — D509 Iron deficiency anemia, unspecified: Secondary | ICD-10-CM | POA: Diagnosis not present

## 2014-09-15 DIAGNOSIS — T451X5A Adverse effect of antineoplastic and immunosuppressive drugs, initial encounter: Secondary | ICD-10-CM | POA: Diagnosis present

## 2014-09-15 DIAGNOSIS — I427 Cardiomyopathy due to drug and external agent: Secondary | ICD-10-CM | POA: Diagnosis present

## 2014-09-15 DIAGNOSIS — I69351 Hemiplegia and hemiparesis following cerebral infarction affecting right dominant side: Secondary | ICD-10-CM | POA: Diagnosis not present

## 2014-09-15 DIAGNOSIS — E11649 Type 2 diabetes mellitus with hypoglycemia without coma: Secondary | ICD-10-CM | POA: Diagnosis present

## 2014-09-15 DIAGNOSIS — I5042 Chronic combined systolic (congestive) and diastolic (congestive) heart failure: Secondary | ICD-10-CM | POA: Diagnosis present

## 2014-09-15 DIAGNOSIS — I5022 Chronic systolic (congestive) heart failure: Secondary | ICD-10-CM | POA: Diagnosis not present

## 2014-09-15 DIAGNOSIS — Z79899 Other long term (current) drug therapy: Secondary | ICD-10-CM | POA: Diagnosis not present

## 2014-09-15 DIAGNOSIS — Z8571 Personal history of Hodgkin lymphoma: Secondary | ICD-10-CM

## 2014-09-15 DIAGNOSIS — K449 Diaphragmatic hernia without obstruction or gangrene: Secondary | ICD-10-CM | POA: Diagnosis present

## 2014-09-15 DIAGNOSIS — I10 Essential (primary) hypertension: Secondary | ICD-10-CM | POA: Diagnosis present

## 2014-09-15 DIAGNOSIS — I5032 Chronic diastolic (congestive) heart failure: Secondary | ICD-10-CM | POA: Diagnosis not present

## 2014-09-15 DIAGNOSIS — Z7901 Long term (current) use of anticoagulants: Secondary | ICD-10-CM | POA: Diagnosis not present

## 2014-09-15 DIAGNOSIS — D500 Iron deficiency anemia secondary to blood loss (chronic): Secondary | ICD-10-CM | POA: Insufficient documentation

## 2014-09-15 DIAGNOSIS — Z8572 Personal history of non-Hodgkin lymphomas: Secondary | ICD-10-CM | POA: Diagnosis not present

## 2014-09-15 DIAGNOSIS — Z5181 Encounter for therapeutic drug level monitoring: Secondary | ICD-10-CM | POA: Diagnosis not present

## 2014-09-15 DIAGNOSIS — E785 Hyperlipidemia, unspecified: Secondary | ICD-10-CM | POA: Diagnosis present

## 2014-09-15 DIAGNOSIS — E869 Volume depletion, unspecified: Secondary | ICD-10-CM | POA: Diagnosis present

## 2014-09-15 DIAGNOSIS — I509 Heart failure, unspecified: Secondary | ICD-10-CM | POA: Diagnosis not present

## 2014-09-15 DIAGNOSIS — Z515 Encounter for palliative care: Secondary | ICD-10-CM | POA: Diagnosis not present

## 2014-09-15 LAB — CBC WITH DIFFERENTIAL/PLATELET
BASOS PCT: 0 % (ref 0–1)
Basophils Absolute: 0 10*3/uL (ref 0.0–0.1)
EOS PCT: 3 % (ref 0–5)
Eosinophils Absolute: 0.2 10*3/uL (ref 0.0–0.7)
HEMATOCRIT: 33.1 % — AB (ref 39.0–52.0)
Hemoglobin: 11.5 g/dL — ABNORMAL LOW (ref 13.0–17.0)
LYMPHS PCT: 26 % (ref 12–46)
Lymphs Abs: 1.9 10*3/uL (ref 0.7–4.0)
MCH: 29.3 pg (ref 26.0–34.0)
MCHC: 34.7 g/dL (ref 30.0–36.0)
MCV: 84.4 fL (ref 78.0–100.0)
MONO ABS: 0.6 10*3/uL (ref 0.1–1.0)
Monocytes Relative: 9 % (ref 3–12)
NEUTROS ABS: 4.6 10*3/uL (ref 1.7–7.7)
Neutrophils Relative %: 62 % (ref 43–77)
Platelets: 288 10*3/uL (ref 150–400)
RBC: 3.92 MIL/uL — ABNORMAL LOW (ref 4.22–5.81)
RDW: 14.8 % (ref 11.5–15.5)
WBC: 7.4 10*3/uL (ref 4.0–10.5)

## 2014-09-15 LAB — COMPREHENSIVE METABOLIC PANEL
ALT: 21 U/L (ref 0–53)
AST: 26 U/L (ref 0–37)
Albumin: 4.1 g/dL (ref 3.5–5.2)
Alkaline Phosphatase: 95 U/L (ref 39–117)
Anion gap: 8 (ref 5–15)
BUN: 24 mg/dL — ABNORMAL HIGH (ref 6–23)
CALCIUM: 9.7 mg/dL (ref 8.4–10.5)
CO2: 27 mmol/L (ref 19–32)
Chloride: 99 mmol/L (ref 96–112)
Creatinine, Ser: 1.31 mg/dL (ref 0.50–1.35)
GFR calc Af Amer: 71 mL/min — ABNORMAL LOW (ref >90)
GFR calc non Af Amer: 62 mL/min — ABNORMAL LOW (ref >90)
Glucose, Bld: 116 mg/dL — ABNORMAL HIGH (ref 70–99)
Potassium: 4.4 mmol/L (ref 3.5–5.1)
SODIUM: 134 mmol/L — AB (ref 135–145)
Total Bilirubin: 0.7 mg/dL (ref 0.3–1.2)
Total Protein: 7.8 g/dL (ref 6.0–8.3)

## 2014-09-15 LAB — PROTIME-INR
INR: 1.98 — AB (ref 0.00–1.49)
Prothrombin Time: 22.7 seconds — ABNORMAL HIGH (ref 11.6–15.2)

## 2014-09-15 LAB — GLUCOSE, CAPILLARY
GLUCOSE-CAPILLARY: 158 mg/dL — AB (ref 70–99)
Glucose-Capillary: 62 mg/dL — ABNORMAL LOW (ref 70–99)
Glucose-Capillary: 168 mg/dL — ABNORMAL HIGH (ref 70–99)

## 2014-09-15 LAB — DIGOXIN LEVEL: DIGOXIN LVL: 0.8 ng/mL (ref 0.8–2.0)

## 2014-09-15 LAB — BRAIN NATRIURETIC PEPTIDE: B Natriuretic Peptide: 100.8 pg/mL — ABNORMAL HIGH (ref 0.0–100.0)

## 2014-09-15 LAB — MAGNESIUM: Magnesium: 2.1 mg/dL (ref 1.5–2.5)

## 2014-09-15 MED ORDER — ATORVASTATIN CALCIUM 20 MG PO TABS
20.0000 mg | ORAL_TABLET | Freq: Every day | ORAL | Status: DC
  Administered 2014-09-15 – 2014-09-24 (×10): 20 mg via ORAL
  Filled 2014-09-15 (×11): qty 1

## 2014-09-15 MED ORDER — INSULIN GLARGINE 100 UNIT/ML ~~LOC~~ SOLN
45.0000 [IU] | Freq: Every day | SUBCUTANEOUS | Status: DC
  Administered 2014-09-15: 45 [IU] via SUBCUTANEOUS
  Filled 2014-09-15 (×2): qty 0.45

## 2014-09-15 MED ORDER — ONDANSETRON HCL 4 MG/2ML IJ SOLN: 4.0000 mg | Freq: Four times a day (QID) | INTRAMUSCULAR | Status: DC | PRN

## 2014-09-15 MED ORDER — SODIUM CHLORIDE 0.9 % IJ SOLN
3.0000 mL | Freq: Two times a day (BID) | INTRAMUSCULAR | Status: DC
  Administered 2014-09-19 – 2014-09-24 (×2): 3 mL via INTRAVENOUS

## 2014-09-15 MED ORDER — SODIUM CHLORIDE 0.9 % IJ SOLN: 3.0000 mL | INTRAMUSCULAR | Status: DC | PRN

## 2014-09-15 MED ORDER — FUROSEMIDE 40 MG PO TABS: 40.0000 mg | ORAL_TABLET | ORAL | Status: DC

## 2014-09-15 MED ORDER — DIGOXIN 125 MCG PO TABS
0.1250 mg | ORAL_TABLET | Freq: Every day | ORAL | Status: DC
  Administered 2014-09-16 – 2014-09-25 (×10): 0.125 mg via ORAL
  Filled 2014-09-15 (×10): qty 1

## 2014-09-15 MED ORDER — HEPARIN (PORCINE) IN NACL 100-0.45 UNIT/ML-% IJ SOLN
1000.0000 [IU]/h | INTRAMUSCULAR | Status: DC
  Administered 2014-09-15: 1000 [IU]/h via INTRAVENOUS
  Filled 2014-09-15 (×3): qty 250

## 2014-09-15 MED ORDER — GLIPIZIDE 5 MG PO TABS
5.0000 mg | ORAL_TABLET | Freq: Every day | ORAL | Status: DC
  Administered 2014-09-16 – 2014-09-17 (×2): 5 mg via ORAL
  Filled 2014-09-15 (×4): qty 1

## 2014-09-15 MED ORDER — SPIRONOLACTONE 25 MG PO TABS
25.0000 mg | ORAL_TABLET | Freq: Every day | ORAL | Status: DC
  Administered 2014-09-16 – 2014-09-25 (×10): 25 mg via ORAL
  Filled 2014-09-15 (×10): qty 1

## 2014-09-15 MED ORDER — SODIUM CHLORIDE 0.9 % IJ SOLN
10.0000 mL | INTRAMUSCULAR | Status: DC | PRN
  Administered 2014-09-16 – 2014-09-25 (×7): 10 mL
  Filled 2014-09-15 (×7): qty 40

## 2014-09-15 MED ORDER — INSULIN ASPART 100 UNIT/ML ~~LOC~~ SOLN
0.0000 [IU] | Freq: Three times a day (TID) | SUBCUTANEOUS | Status: DC
  Administered 2014-09-19 – 2014-09-20 (×2): 3 [IU] via SUBCUTANEOUS
  Administered 2014-09-21 – 2014-09-22 (×2): 2 [IU] via SUBCUTANEOUS
  Administered 2014-09-22: 3 [IU] via SUBCUTANEOUS
  Administered 2014-09-23: 2 [IU] via SUBCUTANEOUS
  Administered 2014-09-23: 3 [IU] via SUBCUTANEOUS
  Administered 2014-09-24: 2 [IU] via SUBCUTANEOUS
  Administered 2014-09-24: 5 [IU] via SUBCUTANEOUS
  Administered 2014-09-25: 2 [IU] via SUBCUTANEOUS

## 2014-09-15 MED ORDER — SODIUM CHLORIDE 0.9 % IV SOLN: 250.0000 mL | INTRAVENOUS | Status: DC | PRN

## 2014-09-15 MED ORDER — ACETAMINOPHEN 325 MG PO TABS
650.0000 mg | ORAL_TABLET | ORAL | Status: DC | PRN
  Administered 2014-09-16: 650 mg via ORAL
  Filled 2014-09-15: qty 2

## 2014-09-15 MED ORDER — LOSARTAN POTASSIUM 50 MG PO TABS
50.0000 mg | ORAL_TABLET | Freq: Every day | ORAL | Status: DC
  Administered 2014-09-16 – 2014-09-24 (×8): 50 mg via ORAL
  Filled 2014-09-15 (×12): qty 1

## 2014-09-15 MED ORDER — MILRINONE IN DEXTROSE 20 MG/100ML IV SOLN
0.2500 ug/kg/min | INTRAVENOUS | Status: DC
  Administered 2014-09-15 – 2014-09-25 (×14): 0.25 ug/kg/min via INTRAVENOUS
  Filled 2014-09-15 (×13): qty 100

## 2014-09-15 MED ORDER — MAGNESIUM OXIDE 400 (241.3 MG) MG PO TABS
400.0000 mg | ORAL_TABLET | Freq: Every day | ORAL | Status: DC
  Administered 2014-09-15 – 2014-09-24 (×10): 400 mg via ORAL
  Filled 2014-09-15 (×12): qty 1

## 2014-09-15 NOTE — H&P (Signed)
Advanced Heart Failure Team History and Physical Note    Primary Cardiologist:  Dr Haroldine Laws EP: Dr Lovena Le  Oncologist: Dr Alen Blew PCP: Dr Maudie Mercury ([facility])   Reason for Admission: Heparin Bridge as Part of LVAD work up   HPI:   53 y.o. 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 He is also has NYHA IVD functional class with home milrinone at 0.25 mcg. Currently getting work up for LVAD.   He stopped xarelto and started on coumadin February 5th to see if he could handle frequent INR check associated with coumadin. On February 29th he had hemoglobin 6.8 and he was instructed to go to ED. Repeat hemoglobin was 11.1 which was down from previous 14-15.   Today we are admitting him to start heparin in preparation for EGD/colonscopy as part of LVAD work up. Denies SOB. Denies BRBPR. [facility] following for home milrinone.   06/18/14: Echo EF 20-25%, grade III DD, mild Andre, mod TR, RV sys fx moderately reduced  Review of Systems: [y] = yes, [ ]  = no   General: Weight gain [ ] ; Weight loss [ ] ; Anorexia [ ] ; Fatigue [ Y]; Fever [ ] ; Chills [ ] ; Weakness [ ]   Cardiac: Chest pain/pressure [ ] ; Resting SOB [ ] ; Exertional SOB [ ] ; Orthopnea [ ] ; Pedal Edema [ ] ; Palpitations [ ] ; Syncope [ ] ; Presyncope [ ] ; Paroxysmal nocturnal dyspnea[ ]   Pulmonary: Cough [ ] ; Wheezing[ ] ; Hemoptysis[ ] ; Sputum [ ] ; Snoring [ ]   GI: Vomiting[ ] ; Dysphagia[ ] ; Melena[ ] ; Hematochezia [ ] ; Heartburn[ ] ; Abdominal pain [ ] ; Constipation [ ] ; Diarrhea [ ] ; BRBPR [ ]   GU: Hematuria[ ] ; Dysuria [ ] ; Nocturia[ ]   Vascular: Pain in legs with walking [ ] ; Pain in feet with lying flat [ ] ; Non-healing sores [ ] ;  Stroke [ ] ; TIA [ ] ; Slurred speech [ ] ;  Neuro: Headaches[ ] ; Vertigo[ ] ; Seizures[ ] ; Paresthesias[ ] ;Blurred vision [ ] ; Diplopia [ ] ; Vision changes [ ]   Ortho/Skin: Arthritis [ ] ; Joint pain [Y ]; Muscle pain [ ] ; Joint swelling [ ] ; Back Pain [ ] ; Rash [ ]   Psych: Depression[ ] ; Anxiety[ ]   Heme: Bleeding problems [ ] ; Clotting disorders [ ] ; Anemia [ ]   Endocrine: Diabetes [ ] ; Thyroid dysfunction[ ]   Home Medications Prior to Admission medications   Medication Sig Start Date End Date Taking? Authorizing Provider  albuterol (PROVENTIL HFA;VENTOLIN HFA) 108 (90 BASE) MCG/ACT inhaler Inhale 1-2 puffs into the lungs every 6 (six) hours as needed for wheezing or shortness of breath.    Historical Provider, MD  atorvastatin (LIPITOR) 40 MG tablet Take 0.5 tablets (20 mg total) by mouth at bedtime. 09/04/14   Larey Dresser, MD  bisacodyl (DULCOLAX) 5 MG EC tablet Take 1 tablet (5 mg total) by mouth daily as needed for moderate constipation. Patient not taking: Reported on 09/08/2014 09/04/14   Larey Dresser, MD  digoxin (LANOXIN) 0.125 MG tablet Take 0.125 mg by mouth daily.    Historical Provider, MD  docusate sodium (COLACE) 100 MG capsule Take 1 capsule (100 mg total) by mouth 2 (two) times daily. Patient not taking: Reported on 09/08/2014 09/04/14   Kirk Ruths  Claris Gladden, MD  furosemide (LASIX) 40 MG tablet Take 1 tablet (40 mg total) by mouth every other day. 09/04/14   Larey Dresser, MD  glipiZIDE (GLUCOTROL) 5 MG tablet Take 5 mg by mouth daily before breakfast.    Historical Provider, MD  insulin glargine (LANTUS) 100 UNIT/ML injection Inject 0.45 mLs (45 Units total) into the skin at bedtime. 06/23/14   Amy D Ninfa Meeker, NP  isosorbide dinitrate (ISORDIL) 5 MG tablet Take 5 mg by mouth 2 (two) times daily.    Historical Provider, MD  losartan (COZAAR) 100 MG tablet Take 50 mg by mouth at bedtime.     Historical Provider, MD  Magnesium 400 MG TABS Take 1 tablet by mouth daily with breakfast.  09/04/14   Larey Dresser, MD  milrinone [facility]) 20 MG/100ML SOLN infusion Inject 19.5 mcg/min into the vein continuous. 06/23/14   Amy D Ninfa Meeker, NP  spironolactone (ALDACTONE) 25 MG tablet Take 1 tablet (25 mg total) by mouth daily. 09/04/14   Larey Dresser, MD  warfarin (COUMADIN) 5 MG tablet Take 1 tablet (5 mg total) by mouth daily. Or as directed. 45 tablets should be a 30-day supply 09/06/14   Lonn Georgia, PA-C    Past Medical History: Past Medical History  Diagnosis Date  . Paroxysmal ventricular tachycardia 2005, 2010    s/p AICD '05, replaced w/ St. Jude's in 2010 (PVT/V.Fib arrest requiring ICD implant in 2005)  . HYPERTENSION, UNSPECIFIED   . HYPERLIPIDEMIA-MIXED   . 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 Nonischemic cardiomyopathy     2/2 Adriamycin administration for lymphoma  . CHF (congestive heart failure)     EF 15% by echo 2009; s/p St. Jude ICD  . Diabetes mellitus     Type 2  . Cancer 1992, 2007     non hodkins lymphoma 1992, Hodgkins 2007  . Depression   . LV (left ventricular) mural thrombus 2009    2009, on coumadin  . Small bowel obstruction 2001    s/p Small bowel resection 2001  . Jejunal intussusception 2008    2008  . 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 Splenic mass 2009    Noted on Abd [place] 2009 w/ recs for f/u CT - not done  . Hypotension   . Noncompliance with medication regimen   . AICD (automatic cardioverter/defibrillator) present 2005, 2010  . Presence of permanent cardiac pacemaker     Past Surgical History: Past Surgical History  Procedure Laterality Date  . Cardiac defibrillator placement      2005, replaced w/ St. Jude's 2010  . Vasectomy    . Bowel resection      small bowell 2/2 obstruction 2001  . Pacemaker insertion    . Insert / replace / remove pacemaker      Family  History: Family History  Problem Relation Age of Onset  . Other      No known family h/o heart disease  . Heart disease    . Diabetes Mother   . Other Other     complications from hip replacement    Social History: History   Social History  . Marital Status: Married    Spouse Name: N/A  . Number of Children: N/A  . Years of Education: N/A   Social History Main Topics  .  Smoking status: Never Smoker   . Smokeless tobacco: Never Used  . Alcohol Use: 1.2 oz/week    1 Cans of beer, 1 Shots of liquor per week     Comment: ON SPECIAL OCCASIONS  . Drug Use: No  . Sexual Activity: No   Other Topics Concern  . Not on file   Social History Narrative    Allergies:  No Known Allergies  Objective:    Vital Signs:       There were no vitals filed for this visit.  Physical Exam: General:  Well appearing. No resp difficulty HEENT: normal Neck: supple. JVP ~6  . Carotids 2+ bilat; no bruits. No lymphadenopathy or thryomegaly appreciated. Cor: PMI nondisplaced. Regular rate & rhythm. No rubs,  or murmurs. + S3 Lungs: clear Abdomen: soft, nontender, nondistended. No hepatosplenomegaly. No bruits or masses. Good bowel sounds. Extremities: no cyanosis, clubbing, rash, edema. RUE double lumen PICC Neuro: alert & orientedx3, cranial nerves grossly intact. moves all 4 extremities w/o difficulty. Affect pleasant  Telemetry:   Labs: Basic Metabolic Panel:  Recent Labs Lab 09/08/14 1553  NA 133*  K 4.1  CL 99  CO2 28  GLUCOSE 49*  BUN 26*  CREATININE 1.17  CALCIUM 9.3    Liver Function Tests: No results for input(s): AST, ALT, ALKPHOS, BILITOT, PROT, ALBUMIN in the last 168 hours. No results for input(s): LIPASE, AMYLASE in the last 168 hours. No results for input(s): AMMONIA in the last 168 hours.  CBC:  Recent Labs Lab 09/08/14 1553  WBC 10.8*  NEUTROABS 8.1*  HGB 11.1*  HCT 31.7*  MCV 84.5  PLT 270    Cardiac Enzymes: No results for input(s):  CKTOTAL, CKMB, CKMBINDEX, TROPONINI in the last 168 hours.  BNP: BNP (last 3 results) No results for input(s): BNP in the last 8760 hours.  ProBNP (last 3 results)  Recent Labs  06/12/14 1415 06/18/14 1200  PROBNP 2079.0* 2144.0*     CBG: No results for input(s): GLUCAP in the last 168 hours.  Coagulation Studies: No results for input(s): LABPROT, INR in the last 72 hours.  Other results: EKG:   Imaging:  No results found.      Assessment:  1. Chronic Systolic Heart Failure on chronic milrinone 0.25 mcg  2. NICM - chemo induced adriamycin 3. 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 Non Hodgkins Lymphoma 5. H/O CVA 2008  6. H/O VT/VF --St Jude ICD 2010  7. DMII   Plan/Discussion:   Andre Tran is 54 year old with chronic systolic HF on home milrinone at 0.25 mcg current ly undergoing LVAD work up. He is admitted today for EGD and colonoscopy once INR < 1.5. Dr Hilarie Fredrickson is aware of admit.   Continue current HF regimen to include home milrinone 0.25 mcg, digoxin 0.125 mg daily, losartan 50 mg daily, and lasix 40 mg every other day. Check labs today.   History of LV thrombus - Hold coumadin to allow INR to drift down to < 1.5 . Pharmacy to start heparin. Check INR CBC now.   Check Hgb A1C. Continue home diabetes regimen.   Consult cardiac rehab.    Length of Stay: 0   CLEGG,AMY NP-C  09/15/2014, 12:51 PM  Advanced Heart Failure Team Pager [phone] (M-F; [place])  Please contact [facility] for night-coverage after hours (4p -7a ) and weekends on [email or web address]  Patient seen and examined with Darrick Grinder, NP. We discussed all aspects of the encounter. I agree with  the assessment and plan as stated above.   Patient admitted for heparin bridge (has h/o previous CVA) for pre-VAD EGD and colonoscopy to further evaluate anemia. HF currently stable on milrinone. Will cut back diuretics as he seems a bit dry. Pharmacy to manage anticoagulation for [place].   Daniel  Bensimhon,MD 9:05 PM

## 2014-09-15 NOTE — Progress Notes (Signed)
ANTICOAGULATION CONSULT NOTE - Initial Consult  Pharmacy Consult for heparin / warfarin Indication: Hx CVA, Hx LV thrombus  No Known Allergies  Patient Measurements: Height: 5\' 7"  (170.2 cm) Weight: 162 lb (73.483 kg) IBW/kg (Calculated) : 66.1   Vital Signs: Temp: 98.4 F (36.9 C) (03/07 1258) Temp Source: Oral (03/07 1258) BP: 93/45 mmHg (03/07 1258) Pulse Rate: 106 (03/07 1258)  Labs:  Recent Labs  09/15/14 1535  HGB 11.5*  HCT 33.1*  PLT 288  LABPROT 22.7*  INR 1.98*    Estimated Creatinine Clearance: 69.8 mL/min (by C-G formula based on Cr of 1.17).   Medical History: Past Medical History  Diagnosis Date  . Paroxysmal ventricular tachycardia 2005, 2010    s/p AICD '05, replaced w/ St. Jude's in 2010 (PVT/V.Fib arrest requiring ICD implant in 2005)  . HYPERTENSION, UNSPECIFIED   . HYPERLIPIDEMIA-MIXED   . 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 Nonischemic cardiomyopathy     2/2 Adriamycin administration for lymphoma  . CHF (congestive heart failure)     EF 15% by echo 2009; s/p St. Jude ICD  . Diabetes mellitus     Type 2  . Cancer 1992, 2007     non hodkins lymphoma 1992, Hodgkins 2007  . Depression   . LV (left ventricular) mural thrombus 2009    2009, on coumadin  . Small bowel obstruction 2001    s/p Small bowel resection 2001  . Jejunal intussusception 2008    2008  . 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 Splenic mass 2009    Noted on Abd [place] 2009 w/ recs for f/u CT - not done  . Hypotension   . Noncompliance with medication regimen   . AICD (automatic cardioverter/defibrillator) present 2005, 2010  . Presence of permanent cardiac pacemaker    }  Assessment: 53yom with HFrEF 15%.  Admitted for LVAD work up EGD and colonoscopy.  On chronic warfarin therapy for Hx CVA, LV thrombus and iliac  vein thrombus.  His INR 1.98 on admission - will hold warfarin and start heparin drip.   CBC stable, no bleeding noted.    Goal of Therapy:  INR 2-3 Heparin level 0.3-0.7 units/ml Monitor platelets by anticoagulation protocol: Yes   Plan:  Hold warfarin Heparin drip 1000 uts/hr Heparin level 6hr after start Daily HL, CBC, Protime  Bonnita Nasuti Pharm.D. CPP, BCPS Clinical Pharmacist [phone] 09/15/2014 4:24 PM

## 2014-09-15 NOTE — Progress Notes (Signed)
CARE MANAGEMENT NOTE 09/15/2014  Patient:  RESHAUN, BRISENO   Account Number:  [id number]  Date Initiated:  09/15/2014  Documentation initiated by:  [facility]  Subjective/Objective Assessment:   anemia, LVAD workup     Action/Plan:   Anticipated DC Date:     Anticipated DC Plan:  [place]  CM consult      [facility] Choice  Resumption Of Svcs/PTA Provider   Choice offered to / List presented to:  C-1 Patient           Status of service:  In process, will continue to follow Medicare Important Message given?   (If response is "NO", the following Medicare IM given date fields will be blank) Date Medicare IM given:   Medicare IM given by:   Date Additional Medicare IM given:   Additional Medicare IM given by:    Discharge Disposition:    Per UR Regulation:    If discussed at Long Length of Stay Meetings, dates discussed:    Comments:  09/15/2014 1355 Pt active with [facility] for Milrinone gtt. Waiting final recommendations for home. Jonnie Finner RN CCM Case Mgmt phone [phone]

## 2014-09-16 DIAGNOSIS — D649 Anemia, unspecified: Secondary | ICD-10-CM

## 2014-09-16 DIAGNOSIS — I5032 Chronic diastolic (congestive) heart failure: Secondary | ICD-10-CM

## 2014-09-16 LAB — GLUCOSE, CAPILLARY
GLUCOSE-CAPILLARY: 63 mg/dL — AB (ref 70–99)
GLUCOSE-CAPILLARY: 114 mg/dL — AB (ref 70–99)
GLUCOSE-CAPILLARY: 69 mg/dL — AB (ref 70–99)
Glucose-Capillary: 81 mg/dL (ref 70–99)
Glucose-Capillary: 247 mg/dL — ABNORMAL HIGH (ref 70–99)

## 2014-09-16 LAB — HEMOGLOBIN A1C
Hgb A1c MFr Bld: 8.2 % — ABNORMAL HIGH (ref 4.8–5.6)
Mean Plasma Glucose: 189 mg/dL

## 2014-09-16 LAB — CBC
HCT: 30.6 % — ABNORMAL LOW (ref 39.0–52.0)
Hemoglobin: 10.5 g/dL — ABNORMAL LOW (ref 13.0–17.0)
MCH: 29 pg (ref 26.0–34.0)
MCHC: 34.3 g/dL (ref 30.0–36.0)
MCV: 84.5 fL (ref 78.0–100.0)
Platelets: 276 10*3/uL (ref 150–400)
RBC: 3.62 MIL/uL — ABNORMAL LOW (ref 4.22–5.81)
RDW: 14.8 % (ref 11.5–15.5)
WBC: 7.7 10*3/uL (ref 4.0–10.5)

## 2014-09-16 LAB — BASIC METABOLIC PANEL
ANION GAP: 9 (ref 5–15)
BUN: 26 mg/dL — ABNORMAL HIGH (ref 6–23)
CO2: 26 mmol/L (ref 19–32)
Calcium: 9.1 mg/dL (ref 8.4–10.5)
Chloride: 100 mmol/L (ref 96–112)
Creatinine, Ser: 1.22 mg/dL (ref 0.50–1.35)
GFR calc Af Amer: 78 mL/min — ABNORMAL LOW (ref >90)
GFR calc non Af Amer: 67 mL/min — ABNORMAL LOW (ref >90)
Glucose, Bld: 115 mg/dL — ABNORMAL HIGH (ref 70–99)
Potassium: 4.3 mmol/L (ref 3.5–5.1)
SODIUM: 135 mmol/L (ref 135–145)

## 2014-09-16 LAB — IRON AND TIBC
IRON: 76 ug/dL (ref 42–165)
SATURATION RATIOS: 22 % (ref 20–55)
TIBC: 345 ug/dL (ref 215–435)
UIBC: 269 ug/dL (ref 125–400)

## 2014-09-16 LAB — FERRITIN: FERRITIN: 240 ng/mL (ref 22–322)

## 2014-09-16 LAB — HEPARIN LEVEL (UNFRACTIONATED)
HEPARIN UNFRACTIONATED: 0.5 [IU]/mL (ref 0.30–0.70)
HEPARIN UNFRACTIONATED: 1.02 [IU]/mL — AB (ref 0.30–0.70)

## 2014-09-16 LAB — PROTIME-INR
INR: 2.19 — ABNORMAL HIGH (ref 0.00–1.49)
INR: 1.58 — AB (ref 0.00–1.49)
Prothrombin Time: 24.6 seconds — ABNORMAL HIGH (ref 11.6–15.2)
Prothrombin Time: 19 seconds — ABNORMAL HIGH (ref 11.6–15.2)

## 2014-09-16 MED ORDER — PEG-KCL-NACL-NASULF-NA ASC-C 100 G PO SOLR
0.5000 | Freq: Once | ORAL | Status: AC
  Administered 2014-09-17: 100 g via ORAL
  Filled 2014-09-16: qty 1

## 2014-09-16 MED ORDER — BISACODYL 5 MG PO TBEC
5.0000 mg | DELAYED_RELEASE_TABLET | Freq: Two times a day (BID) | ORAL | Status: AC
  Administered 2014-09-16 – 2014-09-17 (×3): 5 mg via ORAL
  Filled 2014-09-16 (×3): qty 1

## 2014-09-16 MED ORDER — HEPARIN (PORCINE) IN NACL 100-0.45 UNIT/ML-% IJ SOLN
800.0000 [IU]/h | INTRAMUSCULAR | Status: AC
  Administered 2014-09-16 – 2014-09-17 (×2): 900 [IU]/h via INTRAVENOUS
  Filled 2014-09-16 (×2): qty 250

## 2014-09-16 MED ORDER — PEG-KCL-NACL-NASULF-NA ASC-C 100 G PO SOLR
0.5000 | Freq: Once | ORAL | Status: DC
  Filled 2014-09-16: qty 1

## 2014-09-16 MED ORDER — PEG-KCL-NACL-NASULF-NA ASC-C 100 G PO SOLR: 1.0000 | Freq: Once | ORAL | Status: DC

## 2014-09-16 MED ORDER — INSULIN GLARGINE 100 UNIT/ML ~~LOC~~ SOLN
35.0000 [IU] | Freq: Every day | SUBCUTANEOUS | Status: DC
  Administered 2014-09-16: 35 [IU] via SUBCUTANEOUS
  Filled 2014-09-16 (×2): qty 0.35

## 2014-09-16 NOTE — Progress Notes (Addendum)
Subjective:   Feels good. No dyspnea. Wants to go for a walk. INR still 2.2   Intake/Output Summary (Last 24 hours) at 09/16/14 1039 Last data filed at 09/16/14 0800  Gross per 24 hour  Intake    480 ml  Output      0 ml  Net    480 ml    Current meds: . atorvastatin  20 mg Oral q1800  . digoxin  0.125 mg Oral Daily  . glipiZIDE  5 mg Oral QAC breakfast  . insulin aspart  0-15 Units Subcutaneous TID WC  . insulin glargine  45 Units Subcutaneous QHS  . losartan  50 mg Oral Daily  . magnesium oxide  400 mg Oral QHS  . sodium chloride  3 mL Intravenous Q12H  . spironolactone  25 mg Oral Daily   Infusions: . heparin 1,000 Units/hr (09/15/14 1737)  . milrinone 0.25 mcg/kg/min (09/16/14 2297)     Objective:  Blood pressure 101/73, pulse 102, temperature 98.4 F (36.9 C), temperature source Oral, resp. rate 20, height 5\' 7"  (1.702 m), weight 75.07 kg (165 lb 8 oz), SpO2 100 %. Weight change:   Physical Exam: General: Well appearing. No resp difficulty HEENT: normal Neck: supple. JVP ~6 . Carotids 2+ bilat; no bruits. No lymphadenopathy or thryomegaly appreciated. Cor: PMI nondisplaced. Regular rate & rhythm. No rubs, or murmurs. + S3 Lungs: clear Abdomen: soft, nontender, nondistended. No hepatosplenomegaly. No bruits or masses. Good bowel sounds. Extremities: no cyanosis, clubbing, rash, edema. RUE double lumen PICC Neuro: alert & orientedx3, cranial nerves grossly intact. moves all 4 extremities w/o difficulty. Affect pleasant  Telemetry:SR 90s  Lab Results: Basic Metabolic Panel:  Recent Labs Lab 09/15/14 1535 09/16/14 0130  NA 134* 135  K 4.4 4.3  CL 99 100  CO2 27 26  GLUCOSE 116* 115*  BUN 24* 26*  CREATININE 1.31 1.22  CALCIUM 9.7 9.1  MG 2.1  --    Liver Function Tests:  Recent Labs Lab 09/15/14 1535  AST 26  ALT 21  ALKPHOS 95  BILITOT 0.7  PROT 7.8  ALBUMIN 4.1   No results for input(s): LIPASE, AMYLASE in the last 168 hours. No  results for input(s): AMMONIA in the last 168 hours. CBC:  Recent Labs Lab 09/15/14 1535 09/16/14 0130  WBC 7.4 7.7  NEUTROABS 4.6  --   HGB 11.5* 10.5*  HCT 33.1* 30.6*  MCV 84.4 84.5  PLT 288 276   Cardiac Enzymes: No results for input(s): CKTOTAL, CKMB, CKMBINDEX, TROPONINI in the last 168 hours. BNP: Invalid input(s): POCBNP CBG:  Recent Labs Lab 09/15/14 1503 09/15/14 1633 09/15/14 2052 09/16/14 0556  GLUCAP 62* 168* 158* 63*   Microbiology: Lab Results  Component Value Date   CULT  06/21/2014    NO GROWTH 5 DAYS Performed at [facility]  06/21/2014    NO GROWTH 5 DAYS Performed at [facility] No Beta Hemolytic Streptococci Isolated 01/06/2013   CULT INSIGNIFICANT GROWTH 12/18/2011   CULT INSIGNIFICANT GROWTH 05/02/2007   No results for input(s): CULT, SDES in the last 168 hours.  Imaging: No results found.   ASSESSMENT:   1. Chronic Systolic Heart Failure on chronic milrinone 0.25 mcg  2. NICM - chemo induced adriamycin 3. 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 Non Hodgkins Lymphoma 5. H/O CVA 2008  6. H/O VT/VF --St Jude ICD 2010  7. DMII  PLAN/DISCUSSION:  Stable. INR still  up. Plan EGD and colon when INR < 1.6. Continue milrinone. Ok to go for a walk to [employer].   Can stop heparin with INR 2.2. Resume when < 2.0   LOS: 1 day  Glori Bickers, MD 09/16/2014, 10:39 AM

## 2014-09-16 NOTE — Progress Notes (Signed)
CARDIAC REHAB PHASE I   PRE:  Rate/Rhythm: 114 ST  BP:  Supine:   Sitting:   Standing: 119/95   SaO2:   MODE:  Ambulation: 1100 ft  6 minute walk test  POST:  Rate/Rhythm: 135 ST  BP:  Supine:   Sitting: 120/71  Standing:    SaO2: 98%RA  1010-1030 Pt had been up in hall earlier and was getting ready to take another walk. Asked pt if I could walk with him. Timed 6 minute walk test and pt had fast pace. Denied SOB. Tolerated well. Heart rate to 135. Did not need to stop at all during walk. Instructed to rest few hours between walks. Wants to go outside. Graylon Good, RN BSN  09/16/2014 10:27 AM

## 2014-09-16 NOTE — Progress Notes (Signed)
[facility] for heparin Indication: Hx CVA, Hx LV thrombus  No Known Allergies  Patient Measurements: Height: 5\' 7"  (170.2 cm) Weight: 165 lb 8 oz (75.07 kg) IBW/kg (Calculated) : 66.1   Vital Signs: Temp: 97.8 F (36.6 C) (03/08 1355) Temp Source: Oral (03/08 1355) BP: 109/65 mmHg (03/08 1355) Pulse Rate: 96 (03/08 1355)  Labs:  Recent Labs  09/15/14 1535 09/16/14 0130 09/16/14 0842 09/16/14 1950  HGB 11.5* 10.5*  --   --   HCT 33.1* 30.6*  --   --   PLT 288 276  --   --   LABPROT 22.7* 24.6*  --  19.0*  INR 1.98* 2.19*  --  1.58*  HEPARINUNFRC  --  0.50 1.02*  --   CREATININE 1.31 1.22  --   --     Estimated Creatinine Clearance: 67 mL/min (by C-G formula based on Cr of 1.22).   Medical History: Past Medical History  Diagnosis Date  . Paroxysmal ventricular tachycardia 2005, 2010    s/p AICD '05, replaced w/ St. Jude's in 2010 (PVT/V.Fib arrest requiring ICD implant in 2005)  . HYPERTENSION, UNSPECIFIED   . HYPERLIPIDEMIA-MIXED   . 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 Nonischemic cardiomyopathy     2/2 Adriamycin administration for lymphoma  . CHF (congestive heart failure)     EF 15% by echo 2009; s/p St. Jude ICD  . Diabetes mellitus     Type 2  . Cancer 1992, 2007     non hodkins lymphoma 1992, Hodgkins 2007  . Depression   . LV (left ventricular) mural thrombus 2009    2009, on coumadin  . Small bowel obstruction 2001    s/p Small bowel resection 2001  . Jejunal intussusception 2008    2008  . 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 Splenic mass 2009    Noted on Abd [place] 2009 w/ recs for f/u CT - not done  . Hypotension   . Noncompliance with medication regimen   . AICD (automatic cardioverter/defibrillator) present 2005, 2010  . Presence of permanent cardiac  pacemaker     Assessment: 53yom with HFrEF 15%.  Admitted for LVAD work up EGD and colonoscopy.  On chronic warfarin therapy for Hx CVA, LV thrombus and iliac vein thrombus.  His INR was 1.98 on and heparin drip was started. INR this morning was 2.1, and heparin was held. A heparin level was drawn prior to stopping and was elevated at >1, however suspect this was not drawn correctly. CBC stable, no bleeding noted.    Goal of Therapy:  INR 2-3 Heparin level 0.3-0.7 units/ml Monitor platelets by anticoagulation protocol: Yes   Plan:  -Heparin drip 900 units/hr -first HL with AM labs -Daily HL, CBC, INR  Emrah Ariola D. Donnice Nielsen, PharmD, BCPS Clinical Pharmacist Pager: [phone] 09/16/2014 8:51 PM

## 2014-09-16 NOTE — Consult Note (Signed)
Andre Tran: 3:12 PM 09/16/2014  LOS: 1 day    Referring Provider: Dr.Bensimhon  Primary Care Physician:  Rande Brunt, NP Primary Gastroenterologist:  [facility], Dr Hilarie Fredrickson     Reason for Consultation:  Anemia, needs preoperative screening for any potential sources of GI bleeding.    HPI: Andre Tran is a 54 y.o. male.  Patient has advanced congestive heart failure and is on home infusion of milrinone. EF is 15%. Status post St. Jude ICD placement. History of stroke in 2008. He is treated with anti-coagulation for history of mural thrombus in 2009.  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 A splenic mass was noted on CT scan in 2009.  CT scan was repeated, without contrast, on 12/11 2015. This shows normal appearance of the spleen and not demonstrate any worrisome GI findings. Sclerotic changes in the lower thoracic/upper lumbar vertebral bodies suggestive of either osseous metastatic disease or, if he is s/p radiation: changes consistent with prior radiation therapy. In 2008 he underwent small bowel surgery for jejunal intussusception. He has a history of non-Hodgkin's lymphoma in 1992 and then Hodgkin's lymphoma 2007 .  In early December 2015 he was screened for hepatitis B and C as well as HIV. All of these tests were negative/nonreactive.  It is anticipated that this patient will undergo LVAD placement within the next month or so. Dr. Kae Heller would like his GI tract surveyed for potential sources of GI bleeding. Additionally he was switched from surrounding toe to Coumadin on February 5 to see if he could handle frequent INR checks associated with Coumadin which would be needed post LVAD. His hemoglobin went down to 6.8 (this per cardiology notes, however I don't see this as  a in the [facility] records) on February 29 but it was rechecked at 11.1 on February 29, 11.5 on March 7 and 10.5 on March 8. In December his hemoglobin ranged between 14.7 and 15.1. Stool is FOBT negative as of 09/08/14.  Past Medical History  Diagnosis Date  . Paroxysmal ventricular tachycardia 2005, 2010    s/p AICD '05, replaced w/ St. Jude's in 2010 (PVT/V.Fib arrest requiring ICD implant in 2005)  . HYPERTENSION, UNSPECIFIED   . HYPERLIPIDEMIA-MIXED   . 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 Nonischemic cardiomyopathy     2/2 Adriamycin administration for lymphoma  . CHF (congestive heart failure)     EF 15% by echo 2009; s/p St. Jude ICD  . Diabetes mellitus     Type 2  . Cancer 1992, 2007     non hodkins lymphoma 1992, Hodgkins 2007  . Depression   . LV (left ventricular) mural thrombus 2009    2009, on coumadin  . Small bowel obstruction 2001    s/p Small bowel resection 2001  . Jejunal intussusception 2008    2008  . Thrombus     Chronic thrombus left iliac vein w/ extension into  the IVC  . Splenic mass 2009    Noted on Abd [place] 2009 w/ recs for f/u CT - not done  . Hypotension   . Noncompliance with medication regimen   . AICD (automatic cardioverter/defibrillator) present 2005, 2010  . Presence of permanent cardiac pacemaker     Past Surgical History  Procedure Laterality Date  . Cardiac defibrillator placement      2005, replaced w/ St. Jude's 2010  . Vasectomy    . Bowel resection      small bowell 2/2 obstruction 2001  . Pacemaker insertion    . Insert / replace / remove pacemaker      Prior to Admission medications   Medication Sig Start Date End Date Taking? Authorizing Provider  albuterol (PROVENTIL HFA;VENTOLIN HFA) 108 (90 BASE) MCG/ACT inhaler Inhale 1-2 puffs into the lungs every 6 (six) hours as needed for wheezing or shortness of breath.   Yes  Historical Provider, MD  atorvastatin (LIPITOR) 40 MG tablet Take 0.5 tablets (20 mg total) by mouth at bedtime. 09/04/14  Yes Larey Dresser, MD  bisacodyl (DULCOLAX) 5 MG EC tablet Take 1 tablet (5 mg total) by mouth daily as needed for moderate constipation. 09/04/14  Yes Larey Dresser, MD  digoxin (LANOXIN) 0.125 MG tablet Take 0.125 mg by mouth daily.   Yes Historical Provider, MD  docusate sodium (COLACE) 100 MG capsule Take 1 capsule (100 mg total) by mouth 2 (two) times daily. 09/04/14  Yes Larey Dresser, MD  furosemide (LASIX) 40 MG tablet Take 1 tablet (40 mg total) by mouth every other day. Patient taking differently: Take 40 mg by mouth as directed. Take 1 tab TueThuSatSsun 09/04/14  Yes Larey Dresser, MD  glipiZIDE (GLUCOTROL) 5 MG tablet Take 5 mg by mouth daily before breakfast.   Yes Historical Provider, MD  HYDROcodone-acetaminophen (NORCO/VICODIN) 5-325 MG per tablet Take 1-2 tablets by mouth every 6 (six) hours as needed for moderate pain.   Yes Historical Provider, MD  insulin glargine (LANTUS) 100 UNIT/ML injection Inject 0.45 mLs (45 Units total) into the skin at bedtime. 06/23/14  Yes Amy D Clegg, NP  isosorbide dinitrate (ISORDIL) 5 MG tablet Take 5 mg by mouth 2 (two) times daily.   Yes Historical Provider, MD  losartan (COZAAR) 100 MG tablet Take 50 mg by mouth at bedtime.    Yes Historical Provider, MD  Magnesium 400 MG TABS Take 1 tablet by mouth daily with breakfast. 09/04/14  Yes Larey Dresser, MD  milrinone [facility]) 20 MG/100ML SOLN infusion Inject 19.5 mcg/min into the vein continuous. 06/23/14  Yes Amy D Clegg, NP  spironolactone (ALDACTONE) 25 MG tablet Take 1 tablet (25 mg total) by mouth daily. 09/04/14  Yes Larey Dresser, MD  warfarin (COUMADIN) 5 MG tablet Take 1 tablet (5 mg total) by mouth daily. Or as directed. 45 tablets should be a 30-day supply Patient taking differently: Take 5-7.5 mg by mouth daily. Or as directed. 45 tablets should be a 30-day  supply 5mg  TTS, 7.5mg  all other days 09/06/14  Yes Rhonda G Barrett, PA-C    Scheduled Meds: . atorvastatin  20 mg Oral q1800  . digoxin  0.125 mg Oral Daily  . glipiZIDE  5 mg Oral QAC breakfast  . insulin aspart  0-15 Units Subcutaneous TID WC  . insulin glargine  45 Units Subcutaneous QHS  . losartan  50 mg Oral Daily  . magnesium oxide  400 mg Oral QHS  . sodium  chloride  3 mL Intravenous Q12H  . spironolactone  25 mg Oral Daily   Infusions: . milrinone 0.25 mcg/kg/min (09/16/14 0605)   PRN Meds: sodium chloride, acetaminophen, ondansetron (ZOFRAN) IV, sodium chloride, sodium chloride   Allergies as of 09/10/2014  . (No Known Allergies)    Family History  Problem Relation Age of Onset  . Other      No known family h/o heart disease  . Heart disease    . Diabetes Mother   . Other Other     complications from hip replacement    History   Social History  . Marital Status: Married    Spouse Name: N/A  . Number of Children: N/A  . Years of Education: N/A   Occupational History  . Not on file.   Social History Main Topics  . Smoking status: Never Smoker   . Smokeless tobacco: Never Used  . Alcohol Use: 1.2 oz/week    1 Cans of beer, 1 Shots of liquor per week     Comment: ON SPECIAL OCCASIONS  . Drug Use: No  . Sexual Activity: No   Other Topics Concern  . Not on file   Social History Narrative    REVIEW OF SYSTEMS: Constitutional:  Weight essentially stable. ENT:  No nose bleeds Pulm:  Denies dyspnea on exertion. He can walk for 10 or 15 minutes at a moderate pace without dyspnea. No cough. CV:  No palpitations, no LE edema. No chest pain. GU:  No hematuria, no frequency GI:  No dysphagia, no blood per rectum, no melena. Stable twice weekly bowel movements for many years. Heme:  No unusual bruising or excessive bleeding.   Transfusions:  Recalls that he did have blood transfusions at the time of his stroke. Neuro:  No headaches, no peripheral  tingling or numbness Derm:  No itching, no rash or sores.  Endocrine:  No sweats or chills.  No polyuria or dysuria Immunization:  Vaccinations were reviewed and he is up-to-date on flu and pneumococcal vaccines Travel:  None beyond local counties in last few months.    PHYSICAL EXAM: Vital signs in last 24 hours: Filed Vitals:   09/16/14 1355  BP: 109/65  Pulse: 96  Temp: 97.8 F (36.6 C)  Resp: 20   Wt Readings from Last 3 Encounters:  09/16/14 165 lb 8 oz (75.07 kg)  09/08/14 162 lb (73.483 kg)  09/08/14 162 lb (73.483 kg)    General: Pleasant, comfortable. Does not appear obviously ill. Head:  No asymmetry, no facial edema.  Eyes:  No icterus, no conjunctival pallor Ears:  No gross hearing deficits  Nose:  No congestion or discharge Mouth:  Mucous membranes are moist and clear. There is an abundance of plaque but no dental caries. Neck:  No JVD, no masses, no TMG Lungs:  CTA bilaterally. No cough. No labored breathing Heart: RRR. No MRG. S1/S2 audible. Abdomen:  Soft, nontender, nondistended. Active bowel sounds. No masses. No hepatosplenomegaly..   Rectal: Deferred.   Musc/Skeltl: No joint swelling, contractures or obvious deformities. Extremities:  No CCE  Neurologic:  Alert, oriented 3. Sometimes he is finding it hard to find the proper words consistent with a mild expressive aphasia. Reflexes and limb strength not tested. No tremor Skin:  No rash, no sores. Tattoos:  None Nodes:  Cervical adenopathy.   Psych:  Very pleasant, in good spirits. Cooperative. Good historian with good recall of dates.  Intake/Output from previous day: 03/07 0701 - 03/08 0700 In:  240 [P.O.:240] Out: -  Intake/Output this shift: Total I/O In: 480 [P.O.:480] Out: -   LAB RESULTS:  Recent Labs  09/15/14 1535 09/16/14 0130  WBC 7.4 7.7  HGB 11.5* 10.5*  HCT 33.1* 30.6*  PLT 288 276   BMET Lab Results  Component Value Date   NA 135 09/16/2014   NA 134* 09/15/2014   NA  133* 09/08/2014   K 4.3 09/16/2014   K 4.4 09/15/2014   K 4.1 09/08/2014   CL 100 09/16/2014   CL 99 09/15/2014   CL 99 09/08/2014   CO2 26 09/16/2014   CO2 27 09/15/2014   CO2 28 09/08/2014   GLUCOSE 115* 09/16/2014   GLUCOSE 116* 09/15/2014   GLUCOSE 49* 09/08/2014   BUN 26* 09/16/2014   BUN 24* 09/15/2014   BUN 26* 09/08/2014   CREATININE 1.22 09/16/2014   CREATININE 1.31 09/15/2014   CREATININE 1.17 09/08/2014   CALCIUM 9.1 09/16/2014   CALCIUM 9.7 09/15/2014   CALCIUM 9.3 09/08/2014   LFT  Recent Labs  09/15/14 1535  PROT 7.8  ALBUMIN 4.1  AST 26  ALT 21  ALKPHOS 95  BILITOT 0.7   PT/INR Lab Results  Component Value Date   INR 2.19* 09/16/2014   INR 1.98* 09/15/2014   INR 2.1* 09/12/2014   Hepatitis Panel No results for input(s): HEPBSAG, HCVAB, HEPAIGM, HEPBIGM in the last 72 hours. Lipase     Component Value Date/Time   LIPASE 10* 04/12/2007 1831    Drugs of Abuse     Component Value Date/Time   LABOPIA NEGATIVE 04/18/2007 1930   COCAINSCRNUR NEGATIVE 04/18/2007 1930   LABBENZ NEGATIVE 04/18/2007 1930   AMPHETMU NEGATIVE 04/18/2007 1930     RADIOLOGY STUDIES: No results found.  ENDOSCOPIC STUDIES: In 1998 as well as in 2002, at the [facility], he underwent EGDs as well as colonoscopies. These tests were negative for polyps, ulcers, neoplasia or any significant pathologic findings.  IMPRESSION:   *  Patient with end-stage heart failure. Slated for future LVAD and needs screening of the upper and lower GI tract preoperatively. Apparent drop in the hemoglobin earlier in February, not present on previous or subsequent CBC assays. Patient is FOBT negative. Nothing in his history suggests chronic or acute GI bleeding.  Although he has end-stage heart failure, the patient is fairly symptom free on his milrinone drip.  *  Chronic anticoagulation for history of thrombotic disease. His Coumadin is on hold.  *  History of both  non-Hodgkin's (2007)and Hodgkin's lymphoma (1993).  S/P chemotherapy and in remission. Cardiomyopathy may be related to the chemotherapy.  Patient denies history of radiation therapy. Conflicting information as to radiation therapy in Dr. Hazeline Junker notes in July and August 2014.  I don't find any records faxed into Epic regarding his cancer treatments. 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 Split dose prep will begin tomorrow. INR needs to be 1.5 or less in order to proceed with the GI studies.     Azucena Freed  09/16/2014, 3:12 PM Pager: [phone]

## 2014-09-16 NOTE — Progress Notes (Signed)
CRITICAL VALUE ALERT  Critical value received: cbg 63  Date of notification:  09/16/14  Time of notification:  05:56  Critical value read back:  Nurse who received alert:  Hannan Hutmacher RN  MD notified (1st page):  After Protocol implementation with apple juice  CBG 81   Time of first page:    MD notified (2nd page):  Time of second page:  Responding MD:    Time MD responded:

## 2014-09-16 NOTE — Progress Notes (Addendum)
Inpatient Diabetes Program Recommendations  AACE/ADA: New Consensus Statement on Inpatient Glycemic Control (2013)  Target Ranges:  Prepandial:   less than 140 mg/dL      Peak postprandial:   less than 180 mg/dL (1-2 hours)      Critically ill patients:  140 - 180 mg/dL   Reason for Assessment:  Results for Andre Tran, Andre Tran (MRN [id number]) as of 09/16/2014 10:06  Ref. Range 09/15/2014 15:03 09/15/2014 16:33 09/15/2014 20:52 09/16/2014 05:56  Glucose-Capillary Latest Range: 70-99 mg/dL 62 (L) 168 (H) 158 (H) 63 (L)   Diabetes history: Type 2 diabetes Outpatient Diabetes medications: Lantus 45 units daily Current orders for Inpatient glycemic control:  Novolog moderate tid with meals, Glipizide 5 mg daily, Lantus 45 units daily  May consider decreasing Lantus to 35 units q HS due to low fasting CBG this morning.  Thanks, Adah Perl, RN, BC-ADM Inpatient Diabetes Coordinator Pager [phone]

## 2014-09-16 NOTE — Progress Notes (Signed)
ANTICOAGULATION CONSULT NOTE  Pharmacy Consult for heparin  Indication: Hx CVA, Hx LV thrombus  No Known Allergies  Patient Measurements: Height: 5\' 7"  (170.2 cm) Weight: 162 lb (73.483 kg) IBW/kg (Calculated) : 66.1   Vital Signs: Temp: 98.1 F (36.7 C) (03/07 2028) Temp Source: Oral (03/07 2028) BP: 102/61 mmHg (03/07 2028) Pulse Rate: 96 (03/07 2028)  Labs:  Recent Labs  09/15/14 1535 09/16/14 0130  HGB 11.5* 10.5*  HCT 33.1* 30.6*  PLT 288 276  LABPROT 22.7* 24.6*  INR 1.98* 2.19*  HEPARINUNFRC  --  0.50  CREATININE 1.31 1.22    Estimated Creatinine Clearance: 67 mL/min (by C-G formula based on Cr of 1.22).  Assessment: 54 yo m with CHF and h/o CVA for heparin  Goal of Therapy:  INR 2-3 Heparin level 0.3-0.7 units/ml Monitor platelets by anticoagulation protocol: Yes   Plan:  Continue Heparin at current rate   Phillis Knack, PharmD, BCPS  09/16/2014 2:34 AM

## 2014-09-16 NOTE — Progress Notes (Signed)
VAD coordinator met with patient and wife re: ongoing VAD evaluation.   Wife had list of questions re: Palliative, Nutrition, and GI consult, stating "none have been done".  Explained orders for nutrition and palliative were just entered, and we will try to complete these during this admission as part of completing VAD evaluation.    Re-explained GI was contacted prior to admission to confirm MD availability for colonoscopy and endoscopy testing this week. Re-explained need for INR to be 1.6 or less in order for GI doctor to safely perform testing, thus requiring admission to hold coumadin and bridge with heparin.   Wife and patient asked about "eating greens" to lower INR.  Re-explained leafy green vegetables contain Vit K and eating these will affect INR results.  Reviewed prior teaching re: eating same # of helpings of greens each week in order to avoid shifts in INR results.  In cases where INR is high, it may be recommended to eat extra helpings of greens in attempt to lower INR.  Pt and wife verbalized understanding of above.

## 2014-09-17 DIAGNOSIS — D649 Anemia, unspecified: Secondary | ICD-10-CM | POA: Insufficient documentation

## 2014-09-17 DIAGNOSIS — Z515 Encounter for palliative care: Secondary | ICD-10-CM

## 2014-09-17 DIAGNOSIS — E11649 Type 2 diabetes mellitus with hypoglycemia without coma: Secondary | ICD-10-CM

## 2014-09-17 LAB — BASIC METABOLIC PANEL
Anion gap: 7 (ref 5–15)
BUN: 23 mg/dL (ref 6–23)
CALCIUM: 10 mg/dL (ref 8.4–10.5)
CO2: 27 mmol/L (ref 19–32)
Chloride: 100 mmol/L (ref 96–112)
Creatinine, Ser: 1.24 mg/dL (ref 0.50–1.35)
GFR calc Af Amer: 76 mL/min — ABNORMAL LOW (ref >90)
GFR calc non Af Amer: 66 mL/min — ABNORMAL LOW (ref >90)
GLUCOSE: 76 mg/dL (ref 70–99)
Potassium: 4.4 mmol/L (ref 3.5–5.1)
Sodium: 134 mmol/L — ABNORMAL LOW (ref 135–145)

## 2014-09-17 LAB — CBC
HEMATOCRIT: 34.8 % — AB (ref 39.0–52.0)
HEMOGLOBIN: 11.8 g/dL — AB (ref 13.0–17.0)
MCH: 28.9 pg (ref 26.0–34.0)
MCHC: 33.9 g/dL (ref 30.0–36.0)
MCV: 85.1 fL (ref 78.0–100.0)
Platelets: 272 10*3/uL (ref 150–400)
RBC: 4.09 MIL/uL — AB (ref 4.22–5.81)
RDW: 14.7 % (ref 11.5–15.5)
WBC: 6.4 10*3/uL (ref 4.0–10.5)

## 2014-09-17 LAB — PROTIME-INR
INR: 1.39 (ref 0.00–1.49)
Prothrombin Time: 17.2 seconds — ABNORMAL HIGH (ref 11.6–15.2)

## 2014-09-17 LAB — GLUCOSE, CAPILLARY
GLUCOSE-CAPILLARY: 102 mg/dL — AB (ref 70–99)
GLUCOSE-CAPILLARY: 40 mg/dL — AB (ref 70–99)
GLUCOSE-CAPILLARY: 59 mg/dL — AB (ref 70–99)
GLUCOSE-CAPILLARY: 104 mg/dL — AB (ref 70–99)
Glucose-Capillary: 62 mg/dL — ABNORMAL LOW (ref 70–99)

## 2014-09-17 LAB — HEPARIN LEVEL (UNFRACTIONATED)
Heparin Unfractionated: 0.24 IU/mL — ABNORMAL LOW (ref 0.30–0.70)
Heparin Unfractionated: 0.97 IU/mL — ABNORMAL HIGH (ref 0.30–0.70)
Heparin Unfractionated: 0.87 IU/mL — ABNORMAL HIGH (ref 0.30–0.70)

## 2014-09-17 LAB — GLUCOSE, RANDOM: Glucose, Bld: 107 mg/dL — ABNORMAL HIGH (ref 70–99)

## 2014-09-17 NOTE — Progress Notes (Signed)
INITIAL NUTRITION ASSESSMENT  DOCUMENTATION CODES Per approved criteria  -Not Applicable   INTERVENTION: -Continue to monitor  NUTRITION DIAGNOSIS: No nutrition diagnosis at this time.  Goal: -Pt to meet >/= 90% of estimated needs  Monitor:  -PO intake, weight trends, labs  Reason for Assessment: Consult per MD/LVAD eval  54 y.o. male  Admitting Dx: <principal problem not specified>  ASSESSMENT: Pt admitted for LVAD evaluation.  PMH significant for HTN, CVA, CHF, DM, Depression.  S/p St. Jude ICD placement.  Stroke in 2008.    Pt reports his weight fluctuates between 155 to 170 lbs through out the year.  Pt states his appetite has not changed.  He eats a lot of fish and vegetables.  Seems compliant with a Heart Healthy diet.    Height: Ht Readings from Last 1 Encounters:  09/15/14 _0  (1.702 m)    Weight: Wt Readings from Last 1 Encounters:  09/17/14 165 lb 11.2 oz (75.161 kg)    Ideal Body Weight: 148 lbs (67.3 kg)  % Ideal Body Weight: 111%  Wt Readings from Last 10 Encounters:  09/17/14 165 lb 11.2 oz (75.161 kg)  09/08/14 162 lb (73.483 kg)  09/08/14 162 lb (73.483 kg)  08/15/14 172 lb 12.8 oz (78.382 kg)  08/12/14 175 lb 9.6 oz (79.652 kg)  07/23/14 164 lb 8 oz (74.617 kg)  06/30/14 166 lb 4 oz (75.411 kg)  06/23/14 160 lb 4.4 oz (72.7 kg)  06/12/14 160 lb (72.576 kg)  11/21/13 174 lb (78.926 kg)    Usual Body Weight: 170 lbs   % Usual Body Weight: 97%  BMI:  Body mass index is 25.95 kg/(m^2).  Estimated Nutritional Needs: Kcal: 1900-2100 kcal Protein: 90-105 g protein Fluid: >/= 1.9 L  Skin: Incision right leg  Diet Order: Diet clear liquid  EDUCATION NEEDS: -Education needs addressed   Intake/Output Summary (Last 24 hours) at 09/17/14 0919 Last data filed at 09/17/14 0644  Gross per 24 hour  Intake    480 ml  Output   1400 ml  Net   -920 ml    Last BM: PTA   Labs:   Recent Labs Lab 09/15/14 1535 09/16/14 0130  09/17/14 0546  NA 134* 135 134*  K 4.4 4.3 4.4  CL 99 100 100  CO2 _1 BUN 24* 26* 23  CREATININE 1.31 1.22 1.24  CALCIUM 9.7 9.1 10.0  MG 2.1  --   --   GLUCOSE 116* 115* 76    CBG (last 3)   Recent Labs  09/16/14 1655 09/16/14 2047 09/17/14 0618  GLUCAP 69* 247* 102*    Scheduled Meds: . atorvastatin  20 mg Oral q1800  . bisacodyl  5 mg Oral BID  . digoxin  0.125 mg Oral Daily  . glipiZIDE  5 mg Oral QAC breakfast  . insulin aspart  0-15 Units Subcutaneous TID WC  . insulin glargine  35 Units Subcutaneous QHS  . losartan  50 mg Oral Daily  . magnesium oxide  400 mg Oral QHS  . peg 3350 powder  0.5 kit Oral Once   And  . [START ON 09/18/2014] peg 3350 powder  0.5 kit Oral Once  . sodium chloride  3 mL Intravenous Q12H  . spironolactone  25 mg Oral Daily    Continuous Infusions: . heparin 1,000 Units/hr (09/17/14 0705)  . milrinone 0.25 mcg/kg/min (09/17/14 0040)    Past Medical History  Diagnosis Date  . Paroxysmal ventricular tachycardia 2005, 2010  s/p AICD '05, replaced w/ St. Jude's in 2010 (PVT/V.Fib arrest requiring ICD implant in 2005)  . HYPERTENSION, UNSPECIFIED   . HYPERLIPIDEMIA-MIXED   . 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 Nonischemic cardiomyopathy     2/2 Adriamycin administration for lymphoma  . CHF (congestive heart failure)     EF 15% by echo 2009; s/p St. Jude ICD  . Diabetes mellitus     Type 2  . Cancer 1992, 2007     non hodkins lymphoma 1992, Hodgkins 2007  . Depression   . LV (left ventricular) mural thrombus 2009    2009, on coumadin  . Small bowel obstruction 2001    s/p Small bowel resection 2001  . Jejunal intussusception 2008    2008  . 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 Splenic mass 2009    Noted on Abd [place] 2009 w/ recs for f/u CT - not done  . Hypotension    . Noncompliance with medication regimen   . AICD (automatic cardioverter/defibrillator) present 2005, 2010  . Presence of permanent cardiac pacemaker     Past Surgical History  Procedure Laterality Date  . Cardiac defibrillator placement      2005, replaced w/ St. Jude's 2010  . Vasectomy    . Bowel resection      small bowell 2/2 obstruction 2001  . Pacemaker insertion    . Insert / replace / remove pacemaker      Elmer Picker MS Dietetic Intern Pager Number [phone]

## 2014-09-17 NOTE — Progress Notes (Signed)
Daily Rounding Note  09/17/2014, 8:14 AM  LOS: 2 days   SUBJECTIVE:       No complaints  OBJECTIVE:         Vital signs in last 24 hours:    Temp:  [97.7 F (36.5 C)-98.5 F (36.9 C)] 97.7 F (36.5 C) (03/09 0419) Pulse Rate:  [88-98] 88 (03/09 0419) Resp:  [18-20] 18 (03/09 0419) BP: (86-120)/(50-71) 86/50 mmHg (03/09 0419) SpO2:  [98 %-100 %] 100 % (03/09 0419) Weight:  [165 lb 11.2 oz (75.161 kg)] 165 lb 11.2 oz (75.161 kg) (03/09 0500) Last BM Date: 09/15/14 Filed Weights   09/15/14 1258 09/16/14 0405 09/17/14 0500  Weight: 162 lb (73.483 kg) 165 lb 8 oz (75.07 kg) 165 lb 11.2 oz (75.161 kg)   General: pleasant.  Looks well, comfortable.     Heart: RRR Chest: clear bil.  No dyspnea Abdomen: soft, NT, ND.  No mass or HSM.  Active BS  Extremities: no CCE Neuro/Psych:  Pleasant, relaxed, oriented x 3.    Intake/Output from previous day: 03/08 0701 - 03/09 0700 In: 720 [P.O.:720] Out: 1400 [Urine:1400]  Intake/Output this shift:    Lab Results:  Recent Labs  09/15/14 1535 09/16/14 0130 09/17/14 0546  WBC 7.4 7.7 6.4  HGB 11.5* 10.5* 11.8*  HCT 33.1* 30.6* 34.8*  PLT 288 276 272   BMET  Recent Labs  09/15/14 1535 09/16/14 0130 09/17/14 0546  NA 134* 135 134*  K 4.4 4.3 4.4  CL 99 100 100  CO2 _0 GLUCOSE 116* 115* 76  BUN 24* 26* 23  CREATININE 1.31 1.22 1.24  CALCIUM 9.7 9.1 10.0   LFT  Recent Labs  09/15/14 1535  PROT 7.8  ALBUMIN 4.1  AST 26  ALT 21  ALKPHOS 95  BILITOT 0.7   PT/INR  Recent Labs  09/16/14 1950 09/17/14 0546  LABPROT 19.0* 17.2*  INR 1.58* 1.39   Hepatitis Panel No results for input(s): HEPBSAG, HCVAB, HEPAIGM, HEPBIGM in the last 72 hours.  Studies/Results: No results found.  Scheduled Meds: . atorvastatin  20 mg Oral q1800  . bisacodyl  5 mg Oral BID  . digoxin  0.125 mg Oral Daily  . glipiZIDE  5 mg Oral QAC breakfast  . insulin  aspart  0-15 Units Subcutaneous TID WC  . insulin glargine  35 Units Subcutaneous QHS  . losartan  50 mg Oral Daily  . magnesium oxide  400 mg Oral QHS  . peg 3350 powder  0.5 kit Oral Once   And  . [START ON 09/18/2014] peg 3350 powder  0.5 kit Oral Once  . sodium chloride  3 mL Intravenous Q12H  . spironolactone  25 mg Oral Daily   Continuous Infusions: . heparin 1,000 Units/hr (09/17/14 0705)  . milrinone 0.25 mcg/kg/min (09/17/14 0040)   PRN Meds:.sodium chloride, acetaminophen, ondansetron (ZOFRAN) IV, sodium chloride, sodium chloride   ASSESMENT:   * Patient with end-stage heart failure. Slated for future LVAD and needs screening of the upper and lower GI tract preoperatively. Apparent drop in the hemoglobin earlier in February, not present on previous or subsequent CBC assays, though general decline in Hgb overall. FOBT negative. Nothing in his history suggests chronic or acute GI bleeding.   *  ES heart failure. Although he has end-stage heart failure, the patient is fairly symptom free on his milrinone drip.  * Chronic anticoagulation for history of thrombotic disease. His Coumadin  is on hold. Ready for procedures in terms of INR <10.  Pharmacy managing interval Heparin.   * History of both non-Hodgkin's (2007) and Hodgkin's lymphoma (1993). S/P chemotherapy, in remission. Cardiomyopathy may be related to the chemotherapy.Stable sclerotic LS spine vertebral changes.   PLAN   *  EGD/colonoscopy set for 3/10 at 10AM *  Will stop Heparin 5 hours prior to procedures.    Andre Tran  09/17/2014, 8:14 AM Pager: [phone]

## 2014-09-17 NOTE — Progress Notes (Signed)
ANTICOAGULATION CONSULT NOTE  Pharmacy Consult for heparin  Indication: Hx CVA, Hx LV thrombus  No Known Allergies  Patient Measurements: Height: 5\' 7"  (170.2 cm) Weight: 165 lb 11.2 oz (75.161 kg) IBW/kg (Calculated) : 66.1  Heparin dosing weight: 75kg   Vital Signs: Temp: 97.9 F (36.6 C) (03/09 2103) Temp Source: Oral (03/09 2103) BP: 100/69 mmHg (03/09 2103) Pulse Rate: 100 (03/09 2103)  Labs:  Recent Labs  09/15/14 1535 09/16/14 0130  09/16/14 1950 09/17/14 0546 09/17/14 1335 09/17/14 2018  HGB 11.5* 10.5*  --   --  11.8*  --   --   HCT 33.1* 30.6*  --   --  34.8*  --   --   PLT 288 276  --   --  272  --   --   LABPROT 22.7* 24.6*  --  19.0* 17.2*  --   --   INR 1.98* 2.19*  --  1.58* 1.39  --   --   HEPARINUNFRC  --  0.50  < >  --  0.24* 0.97* 0.87*  CREATININE 1.31 1.22  --   --  1.24  --   --   < > = values in this interval not displayed.  Estimated Creatinine Clearance: 65.9 mL/min (by C-G formula based on Cr of 1.24).  Assessment: 54 yo m with CHF and h/o CVA, Coumadin on hold for EGD continues on heparin. Level this morning was SUPRatherapeutic, rate was decreased to 900 units/hr and level was still elevated at 0.87. The level from earlier today was confirmed to be drawn appropriately, tonight's level drawn by IV team- attempted to reach them to confirm site of draw, but calls were not returned. No bleeding noted.  Goal of Therapy:  INR 2-3 Heparin level 0.3-0.7 units/ml Monitor platelets by anticoagulation protocol: Yes   Plan:  -reduce heparin again to 800 units/hr. To be turned off at 0500 per GI- order in place -follow up resumption of heparin and warfarin after procedures  Khira Cudmore D. Makenah Karas, PharmD, BCPS Clinical Pharmacist Pager: [phone] 09/17/2014 9:05 PM

## 2014-09-17 NOTE — Consult Note (Signed)
Patient UU:FBVMO TYJUAN DEMETRO      DOB: 07/08/1961      QQR:[id number]     Consult Note from the Palliative Medicine Team at [facility]    Consult Requested by: Dr. Gala Romney     PCP: Aundria Rud, NP Reason for Consultation: VAD eval      Phone Number:[phone]  Assessment of patients Current state: I met today with Mr. Rison and his wife at bedside. They were able to describe the basics of the VAD and what this means to him. They would want VAD if he is found to be a candidate. Mr. Heitman is very relaxed in his outlook and tells me that he is just happy to be living and breathing today but is not worried or fear death. They seem to have a deep grounded faith in which he finds much comfort. He has no worries or concerns. They have been together ~25 years and they have 2 twin daughters that live in the home and 1 "adopted" daughter not living in the home. He seems to have adequate support system. His wife tells me incidences where she has learned to do sterile dressing changes and flush PICC lines in the past and she is up to learning and doing what is necessary - she is already asking if they could do scheduled dressing changes at a certain time so she can plan to participate. He very much enjoys fishing, cooking, and growing his garden.   Only concerns is Mr. Hollabaugh seems to have some deficit in memory (RN tells me they discussed palliative in detail yesterday but today he says he never heard of palliative) that may be a result of his past stroke. His wife also says they are concerned because he has a somewhat unsteady gait and "stumbles" often. He has not fallen yet but this is a concern if he gets VAD and also with anticoagulants. She says it is worse with his shoes on so I encouraged them to bring in his shoes to work with physical therapy so they can make recommendations. He has no complaints today.    Goals of Care: 1.  Code Status: FULL    2. Disposition: Home when stable.    3. Symptom  Management:   Hypoglycemia: Not symptomatic. Lantus is being held for now. Diabetes Coordinator following. This could be further complicated by clear liquid diet.    4. Psychosocial: Emotional support provided to patient and family at bedside.   5. Spiritual: Very spiritual and religious and finds comfort in his faith.    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 He has h/o recurrent Non Hodgkin's lymphoma (1993, 2007) treated with chemo.  He is also has NYHA IVD functional class with home milrinone at 0.25 mcg. Currently getting work up for LVAD.    ROS: Denies pain, weakness, decreased appetite.     PMH:  Past Medical History  Diagnosis Date  . Paroxysmal ventricular tachycardia 2005, 2010    s/p AICD '05, replaced w/ St. Jude's in 2010 (PVT/V.Fib arrest requiring ICD implant in 2005)  . HYPERTENSION, UNSPECIFIED   . HYPERLIPIDEMIA-MIXED   . 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 Nonischemic cardiomyopathy  2/2 Adriamycin administration for lymphoma  . CHF (congestive heart failure)     EF 15% by echo 2009; s/p St. Jude ICD  . Diabetes mellitus     Type 2  . Cancer 1992, 2007     non hodkins lymphoma 1992, Hodgkins 2007  . Depression   . LV (left ventricular) mural thrombus 2009    2009, on coumadin  . Small bowel obstruction 2001    s/p Small bowel resection 2001  . Jejunal intussusception 2008    2008  . 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 Splenic mass 2009    Noted on Abd [place] 2009 w/ recs for f/u CT - not done  . Hypotension   . Noncompliance with medication regimen   . AICD (automatic  cardioverter/defibrillator) present 2005, 2010  . Presence of permanent cardiac pacemaker      PSH: Past Surgical History  Procedure Laterality Date  . Cardiac defibrillator placement      2005, replaced w/ St. Jude's 2010  . Vasectomy    . Bowel resection      small bowell 2/2 obstruction 2001  . Pacemaker insertion    . Insert / replace / remove pacemaker     I have reviewed the [place] and SH and  If appropriate update it with new information. No Known Allergies Scheduled Meds: . atorvastatin  20 mg Oral q1800  . bisacodyl  5 mg Oral BID  . digoxin  0.125 mg Oral Daily  . glipiZIDE  5 mg Oral QAC breakfast  . insulin aspart  0-15 Units Subcutaneous TID WC  . losartan  50 mg Oral Daily  . magnesium oxide  400 mg Oral QHS  . peg 3350 powder  0.5 kit Oral Once   And  . [START ON 09/18/2014] peg 3350 powder  0.5 kit Oral Once  . sodium chloride  3 mL Intravenous Q12H  . spironolactone  25 mg Oral Daily   Continuous Infusions: . heparin 900 Units/hr (09/17/14 1523)  . milrinone 0.25 mcg/kg/min (09/17/14 0040)   PRN Meds:.sodium chloride, acetaminophen, ondansetron (ZOFRAN) IV, sodium chloride, sodium chloride    BP 133/89 mmHg  Pulse 93  Temp(Src) 97.7 F (36.5 C) (Oral)  Resp 18  Ht $R'5\' 7"'sh$  (1.702 m)  Wt 75.161 kg (165 lb 11.2 oz)  BMI 25.95 kg/m2  SpO2 97%   PPS: 50%   Intake/Output Summary (Last 24 hours) at 09/17/14 1555 Last data filed at 09/17/14 1300  Gross per 24 hour  Intake    840 ml  Output   1600 ml  Net   -760 ml   LBM: 3/8  Physical Exam:  General: NAD, sitting up in bed, pleasant HEENT:  /AT, no JVD, moist mucous membranes Chest:  No labored breathing, symmetric CVS: RRR Abdomen: Soft, NT, ND Ext: MAE, no edema, warm to touch Neuro: Awake, alert, oriented x 3  Labs: CBC    Component Value Date/Time   WBC 6.4 09/17/2014 0546   WBC 5.9 01/16/2013 1041   RBC 4.09* 09/17/2014 0546   RBC 4.23 01/16/2013 1041   HGB 11.8* 09/17/2014 0546    HGB 12.8* 01/16/2013 1041   HCT 34.8* 09/17/2014 0546   HCT 38.7 01/16/2013 1041   PLT 272 09/17/2014 0546   PLT 224 01/16/2013 1041   MCV 85.1 09/17/2014 0546   MCV 91.5 01/16/2013 1041   MCH 28.9 09/17/2014 0546   MCH 30.3 01/16/2013 1041  MCHC 33.9 09/17/2014 0546   MCHC 33.1 01/16/2013 1041   RDW 14.7 09/17/2014 0546   RDW 13.9 01/16/2013 1041   LYMPHSABS 1.9 09/15/2014 1535   LYMPHSABS 1.6 01/16/2013 1041   MONOABS 0.6 09/15/2014 1535   MONOABS 0.4 01/16/2013 1041   EOSABS 0.2 09/15/2014 1535   EOSABS 0.5 01/16/2013 1041   BASOSABS 0.0 09/15/2014 1535   BASOSABS 0.1 01/16/2013 1041    BMET    Component Value Date/Time   NA 134* 09/17/2014 0546   NA 137 01/16/2013 1041   K 4.4 09/17/2014 0546   K 4.7 01/16/2013 1041   CL 100 09/17/2014 0546   CO2 27 09/17/2014 0546   CO2 26 01/16/2013 1041   GLUCOSE 107* 09/17/2014 1230   GLUCOSE 296* 01/16/2013 1041   BUN 23 09/17/2014 0546   BUN 10.4 01/16/2013 1041   CREATININE 1.24 09/17/2014 0546   CREATININE 1.1 01/16/2013 1041   CALCIUM 10.0 09/17/2014 0546   CALCIUM 10.0 01/16/2013 1041   GFRNONAA 66* 09/17/2014 0546   GFRAA 76* 09/17/2014 0546    CMP     Component Value Date/Time   NA 134* 09/17/2014 0546   NA 137 01/16/2013 1041   K 4.4 09/17/2014 0546   K 4.7 01/16/2013 1041   CL 100 09/17/2014 0546   CO2 27 09/17/2014 0546   CO2 26 01/16/2013 1041   GLUCOSE 107* 09/17/2014 1230   GLUCOSE 296* 01/16/2013 1041   BUN 23 09/17/2014 0546   BUN 10.4 01/16/2013 1041   CREATININE 1.24 09/17/2014 0546   CREATININE 1.1 01/16/2013 1041   CALCIUM 10.0 09/17/2014 0546   CALCIUM 10.0 01/16/2013 1041   PROT 7.8 09/15/2014 1535   PROT 7.5 01/16/2013 1041   ALBUMIN 4.1 09/15/2014 1535   ALBUMIN 4.0 01/16/2013 1041   AST 26 09/15/2014 1535   AST 13 01/16/2013 1041   ALT 21 09/15/2014 1535   ALT 13 01/16/2013 1041   ALKPHOS 95 09/15/2014 1535   ALKPHOS 116 01/16/2013 1041   BILITOT 0.7 09/15/2014 1535    BILITOT 0.82 01/16/2013 1041   GFRNONAA 66* 09/17/2014 0546   GFRAA 76* 09/17/2014 0546     Time In Time Out Total Time Spent with Patient Total Overall Time  1240 1400 70min 60min    Greater than 50%  of this time was spent counseling and coordinating care related to the above assessment and plan.  Vinie Sill, NP Palliative Medicine Team Pager # [phone] (M-F 8a-5p) Team Phone # [phone] (Nights/Weekends)

## 2014-09-17 NOTE — Progress Notes (Addendum)
[place] for heparin  Indication: Hx CVA, Hx LV thrombus  No Known Allergies  Patient Measurements: Height: 5\' 7"  (170.2 cm) Weight: 165 lb 11.2 oz (75.161 kg) IBW/kg (Calculated) : 66.1   Vital Signs: Temp: 97.7 F (36.5 C) (03/09 0419) Temp Source: Oral (03/09 0419) BP: 86/50 mmHg (03/09 0419) Pulse Rate: 88 (03/09 0419)  Labs:  Recent Labs  09/15/14 1535 09/16/14 0130 09/16/14 0842 09/16/14 1950 09/17/14 0546  HGB 11.5* 10.5*  --   --  11.8*  HCT 33.1* 30.6*  --   --  34.8*  PLT 288 276  --   --  272  LABPROT 22.7* 24.6*  --  19.0* 17.2*  INR 1.98* 2.19*  --  1.58* 1.39  HEPARINUNFRC  --  0.50 1.02*  --  0.24*  CREATININE 1.31 1.22  --   --   --     Estimated Creatinine Clearance: 67 mL/min (by C-G formula based on Cr of 1.22).  Assessment: 54 yo m with CHF and h/o CVA, Coumadin on hold for EGD, for heparin  Goal of Therapy:  INR 2-3 Heparin level 0.3-0.7 units/ml Monitor platelets by anticoagulation protocol: Yes   Plan:  Increase Heparin 1000 units/hr  Phillis Knack, PharmD, BCPS  09/17/2014 6:59 AM    Addendum -Heparin level SUPRAtherapeutic -No bleeding/line issues per RN -F/u redraw and adjust accordingly   Harvel Quale 09/17/2014 2:47 PM

## 2014-09-17 NOTE — Progress Notes (Addendum)
Subjective:   Feels good. No dyspnea. Walking floor. INR 1.4. No bleeding.    Intake/Output Summary (Last 24 hours) at 09/17/14 0841 Last data filed at 09/17/14 0644  Gross per 24 hour  Intake    480 ml  Output   1400 ml  Net   -920 ml    Current meds: . atorvastatin  20 mg Oral q1800  . bisacodyl  5 mg Oral BID  . digoxin  0.125 mg Oral Daily  . glipiZIDE  5 mg Oral QAC breakfast  . insulin aspart  0-15 Units Subcutaneous TID WC  . insulin glargine  35 Units Subcutaneous QHS  . losartan  50 mg Oral Daily  . magnesium oxide  400 mg Oral QHS  . peg 3350 powder  0.5 kit Oral Once   And  . [START ON 09/18/2014] peg 3350 powder  0.5 kit Oral Once  . sodium chloride  3 mL Intravenous Q12H  . spironolactone  25 mg Oral Daily   Infusions: . heparin 1,000 Units/hr (09/17/14 0705)  . milrinone 0.25 mcg/kg/min (09/17/14 0040)     Objective:  Blood pressure 86/50, pulse 88, temperature 97.7 F (36.5 C), temperature source Oral, resp. rate 18, height _0  (1.702 m), weight 75.161 kg (165 lb 11.2 oz), SpO2 100 %. Weight change: 1.678 kg (3 lb 11.2 oz)  MAP 74  Physical Exam: General: Well appearing. No resp difficulty HEENT: normal Neck: supple. JVP ~6 . Carotids 2+ bilat; no bruits. No lymphadenopathy or thryomegaly appreciated. Cor: PMI nondisplaced. Regular rate & rhythm. No rubs, or murmurs. + S3 Lungs: clear Abdomen: soft, nontender, nondistended. No hepatosplenomegaly. No bruits or masses. Good bowel sounds. Extremities: no cyanosis, clubbing, rash, edema. RUE double lumen PICC Neuro: alert & orientedx3, cranial nerves grossly intact. moves all 4 extremities w/o difficulty. Affect pleasant  Telemetry:SR 80- 90s  Lab Results: Basic Metabolic Panel:  Recent Labs Lab 09/15/14 1535 09/16/14 0130 09/17/14 0546  NA 134* 135 134*  K 4.4 4.3 4.4  CL 99 100 100  CO2 _1 GLUCOSE 116* 115* 76  BUN 24* 26* 23  CREATININE 1.31 1.22 1.24  CALCIUM 9.7 9.1  10.0  MG 2.1  --   --    Liver Function Tests:  Recent Labs Lab 09/15/14 1535  AST 26  ALT 21  ALKPHOS 95  BILITOT 0.7  PROT 7.8  ALBUMIN 4.1   No results for input(s): LIPASE, AMYLASE in the last 168 hours. No results for input(s): AMMONIA in the last 168 hours. CBC:  Recent Labs Lab 09/15/14 1535 09/16/14 0130 09/17/14 0546  WBC 7.4 7.7 6.4  NEUTROABS 4.6  --   --   HGB 11.5* 10.5* 11.8*  HCT 33.1* 30.6* 34.8*  MCV 84.4 84.5 85.1  PLT 288 276 272   Cardiac Enzymes: No results for input(s): CKTOTAL, CKMB, CKMBINDEX, TROPONINI in the last 168 hours. BNP: Invalid input(s): POCBNP CBG:  Recent Labs Lab 09/16/14 0640 09/16/14 1120 09/16/14 1655 09/16/14 2047 09/17/14 0618  GLUCAP 81 114* 69* 247* 102*   Microbiology: Lab Results  Component Value Date   CULT  06/21/2014    NO GROWTH 5 DAYS Performed at [place]  06/21/2014    NO GROWTH 5 DAYS Performed at [place] No Beta Hemolytic Streptococci Isolated 01/06/2013   CULT INSIGNIFICANT GROWTH 12/18/2011   CULT INSIGNIFICANT GROWTH 05/02/2007   No results for input(s): CULT, SDES in  the last 168 hours.  Imaging: No results found.   ASSESSMENT:   1. Chronic Systolic Heart Failure on chronic milrinone 0.25 mcg  2. NICM - chemo induced adriamycin 3. 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 Non Hodgkins Lymphoma 5. H/O CVA 2008  6. H/O VT/VF --St Jude ICD 2010  7. DMII  PLAN/DISCUSSION:  Stable. INR down. GI has seen.  Plan EGD and colon tomorrow. Continue milrinone and heparin. While off coumadin and in house  it is probably best to do his R/L cath for pre-VAD w/u during this admit. Will plan to do Friday am.      LOS: 2 days  Glori Bickers, MD 09/17/2014, 8:41 AM

## 2014-09-17 NOTE — Progress Notes (Signed)
[facility]  Patient Status: Active pt with [facility] prior to this readmission  [facility] is providing the following services: [facility] and [facility] for home Milrinone.  [facility] hospital team will follow Mr. Banks while here and support transition home when ordered.  If patient discharges after hours, please call [phone].   Larry Sierras 09/17/2014, 3:52 PM

## 2014-09-17 NOTE — Progress Notes (Signed)
Hypoglycemic Event  CBG: 40  Treatment: 15 GM carbohydrate snack  Symptoms: None  Follow-up CBG: Time:11:50 CBG Result:59  Possible Reasons for Event: Inadequate meal intake  Comments/MD notified:sent text page and called MD. Ordered STAT CBG    Payton Emerald  Remember to initiate Hypoglycemia Order Set & complete

## 2014-09-17 NOTE — Progress Notes (Signed)
Pt CBG=62.  Gave juice to drink and pt said he would drink it when he returned from his walk.  Pt is alert/oriented, no dizziness, weakness, or diaphoresis. Pt did not want to return to room until he finished his walk. He said we need to understand that when his sugar is high or low, he is fine. Will continue to monitor. Payton Emerald, RN

## 2014-09-18 ENCOUNTER — Inpatient Hospital Stay (HOSPITAL_COMMUNITY): Payer: Medicare Other | Admitting: Certified Registered"

## 2014-09-18 ENCOUNTER — Encounter (HOSPITAL_COMMUNITY)
Admission: AD | Disposition: A | Discharge status: Home / Self Care | Payer: Medicare Other | Source: Ambulatory Visit | Attending: Internal Medicine

## 2014-09-18 ENCOUNTER — Encounter (HOSPITAL_COMMUNITY): Payer: Self-pay | Admitting: Internal Medicine

## 2014-09-18 DIAGNOSIS — K449 Diaphragmatic hernia without obstruction or gangrene: Secondary | ICD-10-CM

## 2014-09-18 HISTORY — PX: COLONOSCOPY: SHX5424

## 2014-09-18 HISTORY — PX: ESOPHAGOGASTRODUODENOSCOPY: SHX5428

## 2014-09-18 LAB — PROTIME-INR
INR: 1.38 (ref 0.00–1.49)
Prothrombin Time: 17.2 seconds — ABNORMAL HIGH (ref 11.6–15.2)

## 2014-09-18 LAB — BASIC METABOLIC PANEL
Anion gap: 6 (ref 5–15)
BUN: 14 mg/dL (ref 6–23)
CALCIUM: 9 mg/dL (ref 8.4–10.5)
CO2: 25 mmol/L (ref 19–32)
Chloride: 101 mmol/L (ref 96–112)
Creatinine, Ser: 1.03 mg/dL (ref 0.50–1.35)
GFR calc non Af Amer: 82 mL/min — ABNORMAL LOW (ref >90)
GLUCOSE: 86 mg/dL (ref 70–99)
Potassium: 4.3 mmol/L (ref 3.5–5.1)
Sodium: 132 mmol/L — ABNORMAL LOW (ref 135–145)

## 2014-09-18 LAB — HEPARIN LEVEL (UNFRACTIONATED): Heparin Unfractionated: 0.37 IU/mL (ref 0.30–0.70)

## 2014-09-18 LAB — GLUCOSE, CAPILLARY
GLUCOSE-CAPILLARY: 84 mg/dL (ref 70–99)
Glucose-Capillary: 80 mg/dL (ref 70–99)
Glucose-Capillary: 76 mg/dL (ref 70–99)
Glucose-Capillary: 104 mg/dL — ABNORMAL HIGH (ref 70–99)

## 2014-09-18 LAB — CBC
HEMATOCRIT: 30.2 % — AB (ref 39.0–52.0)
Hemoglobin: 10.4 g/dL — ABNORMAL LOW (ref 13.0–17.0)
MCH: 29.1 pg (ref 26.0–34.0)
MCHC: 34.4 g/dL (ref 30.0–36.0)
MCV: 84.6 fL (ref 78.0–100.0)
PLATELETS: 217 10*3/uL (ref 150–400)
RBC: 3.57 MIL/uL — ABNORMAL LOW (ref 4.22–5.81)
RDW: 14.7 % (ref 11.5–15.5)
WBC: 7.3 10*3/uL (ref 4.0–10.5)

## 2014-09-18 SURGERY — COLONOSCOPY
Anesthesia: Monitor Anesthesia Care

## 2014-09-18 MED ORDER — PEG-KCL-NACL-NASULF-NA ASC-C 100 G PO SOLR
0.5000 | Freq: Once | ORAL | Status: DC
  Filled 2014-09-18: qty 1

## 2014-09-18 MED ORDER — SODIUM CHLORIDE 0.9 % IV SOLN: INTRAVENOUS | Status: DC

## 2014-09-18 MED ORDER — HEPARIN (PORCINE) IN NACL 100-0.45 UNIT/ML-% IJ SOLN
800.0000 [IU]/h | INTRAMUSCULAR | Status: AC
  Administered 2014-09-18 (×2): 800 [IU]/h via INTRAVENOUS
  Filled 2014-09-18 (×2): qty 250

## 2014-09-18 MED ORDER — PROPOFOL INFUSION 10 MG/ML OPTIME
INTRAVENOUS | Status: DC | PRN
  Administered 2014-09-18: 50 ug/kg/min via INTRAVENOUS

## 2014-09-18 MED ORDER — GLIPIZIDE 5 MG PO TABS
5.0000 mg | ORAL_TABLET | Freq: Every day | ORAL | Status: DC
  Administered 2014-09-20 – 2014-09-25 (×6): 5 mg via ORAL
  Filled 2014-09-18 (×8): qty 1

## 2014-09-18 MED ORDER — PEG-KCL-NACL-NASULF-NA ASC-C 100 G PO SOLR: 1.0000 | Freq: Once | ORAL | Status: DC

## 2014-09-18 MED ORDER — PEG-KCL-NACL-NASULF-NA ASC-C 100 G PO SOLR
0.5000 | Freq: Once | ORAL | Status: AC
Start: 2014-09-19 — End: 2014-09-19
  Administered 2014-09-19: 100 g via ORAL

## 2014-09-18 MED ORDER — LACTATED RINGERS IV SOLN
INTRAVENOUS | Status: DC | PRN
  Administered 2014-09-18: 10:00:00 via INTRAVENOUS

## 2014-09-18 MED ORDER — SODIUM CHLORIDE 0.9 % IV SOLN: 250.0000 mL | INTRAVENOUS | Status: DC | PRN

## 2014-09-18 MED ORDER — BUTAMBEN-TETRACAINE-BENZOCAINE 2-2-14 % EX AERO
INHALATION_SPRAY | CUTANEOUS | Status: DC | PRN
  Administered 2014-09-18: 3 via TOPICAL

## 2014-09-18 MED ORDER — PEG-KCL-NACL-NASULF-NA ASC-C 100 G PO SOLR
0.5000 | Freq: Once | ORAL | Status: AC
Start: 2014-09-18 — End: 2014-09-18
  Administered 2014-09-18: 100 g via ORAL
  Filled 2014-09-18: qty 1

## 2014-09-18 MED ORDER — SODIUM CHLORIDE 0.9 % IJ SOLN: 3.0000 mL | INTRAMUSCULAR | Status: DC | PRN

## 2014-09-18 MED ORDER — ASPIRIN 81 MG PO CHEW
81.0000 mg | CHEWABLE_TABLET | ORAL | Status: AC
  Administered 2014-09-19: 81 mg via ORAL
  Filled 2014-09-18: qty 1

## 2014-09-18 MED ORDER — PEG-KCL-NACL-NASULF-NA ASC-C 100 G PO SOLR: 0.5000 | Freq: Once | ORAL | Status: DC

## 2014-09-18 MED ORDER — SODIUM CHLORIDE 0.9 % IJ SOLN: 3.0000 mL | Freq: Two times a day (BID) | INTRAMUSCULAR | Status: DC

## 2014-09-18 MED ORDER — PROPOFOL 10 MG/ML IV BOLUS
INTRAVENOUS | Status: DC | PRN
  Administered 2014-09-18: 25 mg via INTRAVENOUS

## 2014-09-18 NOTE — Op Note (Signed)
[facility] [place], [address]   COLONOSCOPY PROCEDURE REPORT  PATIENT: Andre Tran, Andre Tran  MR#: [id number] BIRTHDATE: April 04, 1961 , 70  yrs. old GENDER: male ENDOSCOPIST: [name], MD REFERRED BU:LAGTXM Karlyn Agee, M.D. PROCEDURE DATE:  09/18/2014 PROCEDURE:   Incomplete colonoscopy First Screening Colonoscopy - Avg.  risk and is 50 yrs.  old or older - No.  Prior Negative Screening - Now for repeat screening. N/A  History of Adenoma - Now for follow-up colonoscopy & has been > or = to 3 yrs.  N/A ASA CLASS:   Class IV INDICATIONS:anemia, non-specific. MEDICATIONS: Monitored anesthesia care and Per Anesthesia  DESCRIPTION OF PROCEDURE:   After the risks benefits and alternatives of the procedure were thoroughly explained, informed consent was obtained.  The digital rectal exam revealed no rectal mass.   The Pentax Adult Colon [id number]  endoscope was introduced through the anus and advanced to the hepatic flexure. No adverse events experienced.   The quality of the prep was poor.  (MoviPrep was used)  The instrument was then slowly withdrawn as the colon was fully examined.    COLON FINDINGS: A significant amount of stool was present throughout the entire examined colon.  The scope was advanced to the hepatic flexure and copious irrigation and lavage performed in attempt to obtain adequate views. It became evident that this was not possible due to solid stool in the right colon and thus the procedure was aborted.  Retroflexion was not performed.      The scope was withdrawn and the procedure completed. COMPLICATIONS: There were no immediate complications.  ENDOSCOPIC IMPRESSION: Incomplete colonoscopy due to poor bowel preparation, unable to clear the colon despite copious irrigation lavage, procedure aborted  RECOMMENDATIONS: Repeat colonoscopy tomorrow with additional bowel preparation  eSigned:  [name], MD 09/18/2014  10:37 AM   cc: The Patient

## 2014-09-18 NOTE — Progress Notes (Signed)
ANTICOAGULATION CONSULT NOTE  Pharmacy Consult for heparin  Indication: Hx CVA, Hx LV thrombus  No Known Allergies  Patient Measurements: Height: 5\' 7"  (170.2 cm) Weight: 163 lb 2.3 oz (74 kg) IBW/kg (Calculated) : 66.1   Vital Signs: Temp: 98.4 F (36.9 C) (03/10 1419) Temp Source: Oral (03/10 1419) BP: 110/71 mmHg (03/10 1419) Pulse Rate: 95 (03/10 1419)  Labs:  Recent Labs  09/16/14 0130  09/16/14 1950 09/17/14 0546 09/17/14 1335 09/17/14 2018 09/18/14 0525 09/18/14 1715  HGB 10.5*  --   --  11.8*  --   --  10.4*  --   HCT 30.6*  --   --  34.8*  --   --  30.2*  --   PLT 276  --   --  272  --   --  217  --   LABPROT 24.6*  --  19.0* 17.2*  --   --  17.2*  --   INR 2.19*  --  1.58* 1.39  --   --  1.38  --   HEPARINUNFRC 0.50  < >  --  0.24* 0.97* 0.87*  --  0.37  CREATININE 1.22  --   --  1.24  --   --  1.03  --   < > = values in this interval not displayed.  Estimated Creatinine Clearance: 79.3 mL/min (by C-G formula based on Cr of 1.03).  Assessment: 54 yo male with sHF, history of CVA, ICD, admitted for LVAD workup, remains on milrinone. Pt has had fluctuating heparin levels this admission.  CBC stable and wnl. Pt underwent EGD this morning, colonoscopy was unable to be preformed due to poor bowel preparation.  Anticipate R and L cath tomorrow morning. CBC stable and wnl, SCr 1, eCrCl > 70 ml/min. Heparin level 0.37 in goal.  Goal of Therapy:  INR 2-3 Heparin level 0.3-0.7 units/ml Monitor platelets by anticoagulation protocol: Yes   Plan:  -Continue heparin at 800 units/hr -Daily HL, CBC -Turn off heparin tomorrow morning at 0430 in anticipation for cath   Dantonio Justen S. Alford Highland, PharmD, [facility] Clinical Staff Pharmacist Pager [phone]   09/18/2014 6:04 PM

## 2014-09-18 NOTE — H&P (View-Only) (Signed)
        Daily Rounding Note  09/17/2014, 8:14 AM  LOS: 2 days   SUBJECTIVE:       No complaints  OBJECTIVE:         Vital signs in last 24 hours:    Temp:  [97.7 F (36.5 C)-98.5 F (36.9 C)] 97.7 F (36.5 C) (03/09 0419) Pulse Rate:  [88-98] 88 (03/09 0419) Resp:  [18-20] 18 (03/09 0419) BP: (86-120)/(50-71) 86/50 mmHg (03/09 0419) SpO2:  [98 %-100 %] 100 % (03/09 0419) Weight:  [165 lb 11.2 oz (75.161 kg)] 165 lb 11.2 oz (75.161 kg) (03/09 0500) Last BM Date: 09/15/14 Filed Weights   09/15/14 1258 09/16/14 0405 09/17/14 0500  Weight: 162 lb (73.483 kg) 165 lb 8 oz (75.07 kg) 165 lb 11.2 oz (75.161 kg)   General: pleasant.  Looks well, comfortable.     Heart: RRR Chest: clear bil.  No dyspnea Abdomen: soft, NT, ND.  No mass or HSM.  Active BS  Extremities: no CCE Neuro/Psych:  Pleasant, relaxed, oriented x 3.    Intake/Output from previous day: 03/08 0701 - 03/09 0700 In: 720 [P.O.:720] Out: 1400 [Urine:1400]  Intake/Output this shift:    Lab Results:  Recent Labs  09/15/14 1535 09/16/14 0130 09/17/14 0546  WBC 7.4 7.7 6.4  HGB 11.5* 10.5* 11.8*  HCT 33.1* 30.6* 34.8*  PLT 288 276 272   BMET  Recent Labs  09/15/14 1535 09/16/14 0130 09/17/14 0546  NA 134* 135 134*  K 4.4 4.3 4.4  CL 99 100 100  CO2 27 26 27  GLUCOSE 116* 115* 76  BUN 24* 26* 23  CREATININE 1.31 1.22 1.24  CALCIUM 9.7 9.1 10.0   LFT  Recent Labs  09/15/14 1535  PROT 7.8  ALBUMIN 4.1  AST 26  ALT 21  ALKPHOS 95  BILITOT 0.7   PT/INR  Recent Labs  09/16/14 1950 09/17/14 0546  LABPROT 19.0* 17.2*  INR 1.58* 1.39   Hepatitis Panel No results for input(s): HEPBSAG, HCVAB, HEPAIGM, HEPBIGM in the last 72 hours.  Studies/Results: No results found.  Scheduled Meds: . atorvastatin  20 mg Oral q1800  . bisacodyl  5 mg Oral BID  . digoxin  0.125 mg Oral Daily  . glipiZIDE  5 mg Oral QAC breakfast  . insulin  aspart  0-15 Units Subcutaneous TID WC  . insulin glargine  35 Units Subcutaneous QHS  . losartan  50 mg Oral Daily  . magnesium oxide  400 mg Oral QHS  . peg 3350 powder  0.5 kit Oral Once   And  . [START ON 09/18/2014] peg 3350 powder  0.5 kit Oral Once  . sodium chloride  3 mL Intravenous Q12H  . spironolactone  25 mg Oral Daily   Continuous Infusions: . heparin 1,000 Units/hr (09/17/14 0705)  . milrinone 0.25 mcg/kg/min (09/17/14 0040)   PRN Meds:.sodium chloride, acetaminophen, ondansetron (ZOFRAN) IV, sodium chloride, sodium chloride   ASSESMENT:   * Patient with end-stage heart failure. Slated for future LVAD and needs screening of the upper and lower GI tract preoperatively. Apparent drop in the hemoglobin earlier in February, not present on previous or subsequent CBC assays, though general decline in Hgb overall. FOBT negative. Nothing in his history suggests chronic or acute GI bleeding.   *  ES heart failure. Although he has end-stage heart failure, the patient is fairly symptom free on his milrinone drip.  * Chronic anticoagulation for history of thrombotic disease. His Coumadin   is on hold. Ready for procedures in terms of INR <10.  Pharmacy managing interval Heparin.   * History of both non-Hodgkin's (2007) and Hodgkin's lymphoma (1993). S/P chemotherapy, in remission. Cardiomyopathy may be related to the chemotherapy.Stable sclerotic LS spine vertebral changes.   PLAN   *  EGD/colonoscopy set for 3/10 at 10AM *  Will stop Heparin 5 hours prior to procedures.    Andre Tran  09/17/2014, 8:14 AM Pager: [phone] 

## 2014-09-18 NOTE — Progress Notes (Addendum)
Day of Surgery Procedure(s) (LRB): COLONOSCOPY (N/A) ESOPHAGOGASTRODUODENOSCOPY (EGD) (N/A) Subjective: finishig eval for possible DT VAD Endoscopy pending today R and L heart cath in am Patient has appropriate understanding of why he is undergoing testing and the role of future VAD for treatment of advanced heart failure  Objective: Vital signs in last 24 hours: Temp:  [97.7 F (36.5 C)-97.9 F (36.6 C)] 97.8 F (36.6 C) (03/10 0606) Pulse Rate:  [86-100] 86 (03/10 0606) Cardiac Rhythm:  [-]  Resp:  [17-18] 17 (03/10 0606) BP: (99-133)/(59-89) 99/59 mmHg (03/10 0606) SpO2:  [97 %-100 %] 100 % (03/10 0606) Weight:  [163 lb 2.3 oz (74 kg)] 163 lb 2.3 oz (74 kg) (03/10 0700)  Hemodynamic parameters for last 24 hours:  nsr  Intake/Output from previous day: 03/09 0701 - 03/10 0700 In: 840 [P.O.:840] Out: 1450 [Urine:1450] Intake/Output this shift: Total I/O In: -  Out: 200 [Urine:200]  Alert and responsive Min edema abd soft nontender Neuro with baseline R side weakness fron old L internal capsule CVA  Lab Results:  Recent Labs  09/17/14 0546 09/18/14 0525  WBC 6.4 7.3  HGB 11.8* 10.4*  HCT 34.8* 30.2*  PLT 272 217   BMET:  Recent Labs  09/17/14 0546 09/17/14 1230 09/18/14 0525  NA 134*  --  132*  K 4.4  --  4.3  CL 100  --  101  CO2 27  --  25  GLUCOSE 76 107* 86  BUN 23  --  14  CREATININE 1.24  --  1.03  CALCIUM 10.0  --  9.0    PT/INR:  Recent Labs  09/18/14 0525  LABPROT 17.2*  INR 1.38   ABG    Component Value Date/Time   PHART 7.432 06/20/2014 1651   HCO3 25.5* 06/20/2014 1651   TCO2 27 06/20/2014 1651   ACIDBASEDEF 0.2 04/18/2007 1310   O2SAT 60.5 06/23/2014 0505   CBG (last 3)   Recent Labs  09/17/14 1616 09/17/14 2058 09/18/14 0611  GLUCAP 62* 104* 84    Assessment/Plan: S/P Procedure(s) (LRB): COLONOSCOPY (N/A) ESOPHAGOGASTRODUODENOSCOPY (EGD) (N/A) Repeat  RHC on milrinone, repeat 2d echo on milrinone Coronary  angio to define anatomy Cont with pre VAD eval/preparation  LOS: 3 days    Andre Tran 09/18/2014

## 2014-09-18 NOTE — Transfer of Care (Signed)
Immediate Anesthesia Transfer of Care Note  Patient: Andre Tran  Procedure(s) Performed: Procedure(s): COLONOSCOPY (N/A) ESOPHAGOGASTRODUODENOSCOPY (EGD) (N/A)  Patient Location: Endoscopy Unit  Anesthesia Type:MAC  Level of Consciousness: awake and alert   Airway & Oxygen Therapy: Patient Spontanous Breathing and Patient connected to nasal cannula oxygen  Post-op Assessment: Report given to RN and Post -op Vital signs reviewed and stable  Post vital signs: Reviewed and stable  Last Vitals:  Filed Vitals:   09/18/14 0925  BP: 99/61  Pulse: 88  Temp:   Resp: 18    Complications: No apparent anesthesia complications

## 2014-09-18 NOTE — Anesthesia Procedure Notes (Signed)
Procedure Name: MAC Date/Time: 09/18/2014 10:02 AM Performed by: Susa Loffler Pre-anesthesia Checklist: Patient identified, Emergency Drugs available, Suction available, Patient being monitored and Timeout performed Patient Re-evaluated:Patient Re-evaluated prior to inductionOxygen Delivery Method: Nasal cannula Preoxygenation: Pre-oxygenation with 100% oxygen Dental Injury: Teeth and Oropharynx as per pre-operative assessment

## 2014-09-18 NOTE — Anesthesia Preprocedure Evaluation (Addendum)
Anesthesia Evaluation  Patient identified by MRN, date of birth, ID band Patient awake    Reviewed: Allergy & Precautions, NPO status , Patient's Chart, lab work & pertinent test results  Airway Mallampati: II  TM Distance: >3 FB Neck ROM: Full    Dental  (+) Teeth Intact, Dental Advisory Given   Pulmonary    + decreased breath sounds      Cardiovascular hypertension, + Peripheral Vascular Disease and +CHF + dysrhythmias Ventricular Tachycardia + pacemaker + Cardiac Defibrillator Rhythm:Regular     Neuro/Psych Depression CVA    GI/Hepatic Neg liver ROS,   Endo/Other  diabetes  Renal/GU      Musculoskeletal   Abdominal   Peds  Hematology  (+) anemia ,   Anesthesia Other Findings   Reproductive/Obstetrics                           Anesthesia Physical Anesthesia Plan  ASA: III  Anesthesia Plan: MAC   Post-op Pain Management:    Induction: Intravenous  Airway Management Planned: Mask  Additional Equipment:   Intra-op Plan:   Post-operative Plan:   Informed Consent: I have reviewed the patients History and Physical, chart, labs and discussed the procedure including the risks, benefits and alternatives for the proposed anesthesia with the patient or authorized representative who has indicated his/her understanding and acceptance.   Dental advisory given  Plan Discussed with: Anesthesiologist, Surgeon and CRNA  Anesthesia Plan Comments:       Anesthesia Quick Evaluation

## 2014-09-18 NOTE — Op Note (Signed)
[facility] [place], [address]   ENDOSCOPY PROCEDURE REPORT  PATIENT: Rankin, Coolman  MR#: [id number] BIRTHDATE: [birth year] , 65  yrs. old GENDER: male ENDOSCOPIST: [name], MD REFERRED BY:  Jolaine Artist, M.D. PROCEDURE DATE:  09/18/2014 PROCEDURE:  EGD, diagnostic ASA CLASS:     Class IV INDICATIONS:  anemia. MEDICATIONS: Monitored anesthesia care and Per Anesthesia TOPICAL ANESTHETIC: Cetacaine Spray  DESCRIPTION OF PROCEDURE: After the risks benefits and alternatives of the procedure were thoroughly explained, informed consent was obtained.  The Pentax Gastroscope [id number] endoscope was introduced through the mouth and advanced to the second portion of the duodenum , Without limitations.  The instrument was slowly withdrawn as the mucosa was fully examined.      EXAM: The esophagus and gastroesophageal junction were completely normal in appearance.  The stomach was entered and closely examined.The antrum, angularis, and lesser curvature were well visualized, including a retroflexed view of the cardia and fundus. The stomach wall was normally distensible.  The scope passed easily through the pylorus into the duodenum.  Retroflexed views revealed a hiatal hernia.     The scope was then withdrawn from the patient and the procedure completed.  COMPLICATIONS: There were no immediate complications.  ENDOSCOPIC IMPRESSION: 1.  Normal EGD except for small hiatal hernia  RECOMMENDATIONS: Proceed with a Colonoscopy.   eSigned:  [name], MD 09/18/2014 10:34 AM    TL:[id number] Karlyn Agee, MD and The Patient

## 2014-09-18 NOTE — Interval H&P Note (Signed)
History and Physical Interval Note: Pt tolerated and complete bowel prep For EGD and colonoscopy today. INR has declined and heparin has been held for 5 hours The nature of the procedure, as well as the risks, benefits, and alternatives were carefully and thoroughly reviewed with the patient. Ample time for discussion and questions allowed. The patient understood, was satisfied, and agreed to proceed.    09/18/2014 9:31 AM  Andre Tran  has presented today for surgery, with the diagnosis of anemia, per LVAD screening.  The various methods of treatment have been discussed with the patient and family. After consideration of risks, benefits and other options for treatment, the patient has consented to  Procedure(s): COLONOSCOPY (N/A) ESOPHAGOGASTRODUODENOSCOPY (EGD) (N/A) as a surgical intervention .  The patient's history has been reviewed, patient examined, no change in status, stable for surgery.  I have reviewed the patient's chart and labs.  Questions were answered to the patient's satisfaction.     Andre Tran M

## 2014-09-18 NOTE — Progress Notes (Addendum)
ANTICOAGULATION CONSULT NOTE  Pharmacy Consult for heparin  Indication: Hx CVA, Hx LV thrombus  No Known Allergies  Patient Measurements: Height: 5\' 7"  (170.2 cm) Weight: 163 lb 2.3 oz (74 kg) IBW/kg (Calculated) : 66.1   Vital Signs: Temp: 97.6 F (36.4 C) (03/10 1040) Temp Source: Oral (03/10 1040) BP: 106/73 mmHg (03/10 1050) Pulse Rate: 97 (03/10 1055)  Labs:  Recent Labs  09/16/14 0130  09/16/14 1950 09/17/14 0546 09/17/14 1335 09/17/14 2018 09/18/14 0525  HGB 10.5*  --   --  11.8*  --   --  10.4*  HCT 30.6*  --   --  34.8*  --   --  30.2*  PLT 276  --   --  272  --   --  217  LABPROT 24.6*  --  19.0* 17.2*  --   --  17.2*  INR 2.19*  --  1.58* 1.39  --   --  1.38  HEPARINUNFRC 0.50  < >  --  0.24* 0.97* 0.87*  --   CREATININE 1.22  --   --  1.24  --   --  1.03  < > = values in this interval not displayed.  Estimated Creatinine Clearance: 79.3 mL/min (by C-G formula based on Cr of 1.03).  Assessment: 54 yo male with sHF, history of CVA, ICD, admitted for LVAD workup, remains on milrinone. Pt has had fluctuating heparin levels this admission.  CBC stable and wnl. Pt underwent EGD this morning, colonoscopy was unable to be preformed due to poor bowel preparation.  Anticipate R and L cath tomorrow morning. CBC stable and wnl, SCr 1, eCrCl > 70 ml/min.  Goal of Therapy:  INR 2-3 Heparin level 0.3-0.7 units/ml Monitor platelets by anticoagulation protocol: Yes   Plan:  -Resume heparin at 800 units/hr -Check level this afternoon -Daily HL, CBC -F/u resumption of warfarin when appropriate -Turn off heparin tomorrow morning at 0430 in anticipation for cath   Hughes Better, PharmD, BCPS Clinical Pharmacist Pager: [phone] 09/18/2014 11:20 AM

## 2014-09-18 NOTE — Anesthesia Postprocedure Evaluation (Signed)
  Anesthesia Post-op Note  Patient: Andre Tran  Procedure(s) Performed: Procedure(s): COLONOSCOPY (N/A) ESOPHAGOGASTRODUODENOSCOPY (EGD) (N/A)  Patient Location: PACU  Anesthesia Type:MAC  Level of Consciousness: awake and alert   Airway and Oxygen Therapy: Patient Spontanous Breathing  Post-op Pain: none  Post-op Assessment: Post-op Vital signs reviewed  Post-op Vital Signs: Reviewed  Last Vitals:  Filed Vitals:   09/18/14 1055  BP:   Pulse: 97  Temp:   Resp: 22    Complications: No apparent anesthesia complications

## 2014-09-18 NOTE — Progress Notes (Signed)
Subjective:   Tolerating prep well. No dyspnea. Walking floor. INR 1.4. No bleeding. Weight stable 163   Intake/Output Summary (Last 24 hours) at 09/18/14 0757 Last data filed at 09/18/14 0606  Gross per 24 hour  Intake    840 ml  Output   1450 ml  Net   -610 ml    Current meds: . atorvastatin  20 mg Oral q1800  . digoxin  0.125 mg Oral Daily  . glipiZIDE  5 mg Oral QAC breakfast  . insulin aspart  0-15 Units Subcutaneous TID WC  . losartan  50 mg Oral Daily  . magnesium oxide  400 mg Oral QHS  . sodium chloride  3 mL Intravenous Q12H  . spironolactone  25 mg Oral Daily   Infusions: . milrinone 0.25 mcg/kg/min (09/17/14 2152)     Objective:  Blood pressure 99/59, pulse 86, temperature 97.8 F (36.6 C), temperature source Oral, resp. rate 17, height 5\' 7"  (1.702 m), weight 74 kg (163 lb 2.3 oz), SpO2 100 %. Weight change: -1.161 kg (-2 lb 9 oz)   Physical Exam: General: Well appearing. No resp difficulty HEENT: normal Neck: supple. JV flat . Carotids 2+ bilat; no bruits. No lymphadenopathy or thryomegaly appreciated. Cor: PMI nondisplaced. Regular rate & rhythm. No rubs, or murmurs. + S3 Lungs: clear Abdomen: soft, nontender, nondistended. No hepatosplenomegaly. No bruits or masses. Good bowel sounds. Extremities: no cyanosis, clubbing, rash, edema. RUE double lumen PICC Neuro: alert & orientedx3, cranial nerves grossly intact. moves all 4 extremities w/o difficulty. Affect pleasant  Telemetry:SR 80-90s  Lab Results: Basic Metabolic Panel:  Recent Labs Lab 09/15/14 1535 09/16/14 0130 09/17/14 0546 09/17/14 1230 09/18/14 0525  NA 134* 135 134*  --  132*  K 4.4 4.3 4.4  --  4.3  CL 99 100 100  --  101  CO2 27 26 27   --  25  GLUCOSE 116* 115* 76 107* 86  BUN 24* 26* 23  --  14  CREATININE 1.31 1.22 1.24  --  1.03  CALCIUM 9.7 9.1 10.0  --  9.0  MG 2.1  --   --   --   --    Liver Function Tests:  Recent Labs Lab 09/15/14 1535  AST 26  ALT 21    ALKPHOS 95  BILITOT 0.7  PROT 7.8  ALBUMIN 4.1   No results for input(s): LIPASE, AMYLASE in the last 168 hours. No results for input(s): AMMONIA in the last 168 hours. CBC:  Recent Labs Lab 09/15/14 1535 09/16/14 0130 09/17/14 0546 09/18/14 0525  WBC 7.4 7.7 6.4 7.3  NEUTROABS 4.6  --   --   --   HGB 11.5* 10.5* 11.8* 10.4*  HCT 33.1* 30.6* 34.8* 30.2*  MCV 84.4 84.5 85.1 84.6  PLT 288 276 272 217   Cardiac Enzymes: No results for input(s): CKTOTAL, CKMB, CKMBINDEX, TROPONINI in the last 168 hours. BNP: Invalid input(s): POCBNP CBG:  Recent Labs Lab 09/17/14 1126 09/17/14 1150 09/17/14 1616 09/17/14 2058 09/18/14 0611  GLUCAP 40* 59* 62* 104* 84   Microbiology: Lab Results  Component Value Date   CULT  06/21/2014    NO GROWTH 5 DAYS Performed at [place]  06/21/2014    NO GROWTH 5 DAYS Performed at [place] No Beta Hemolytic Streptococci Isolated 01/06/2013   CULT INSIGNIFICANT GROWTH 12/18/2011   CULT INSIGNIFICANT GROWTH 05/02/2007   No results for input(s): CULT, SDES  in the last 168 hours.  Imaging: No results found.   ASSESSMENT:   1. Chronic Systolic Heart Failure on chronic milrinone 0.25 mcg  2. NICM - chemo induced adriamycin 3. 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 Non Hodgkins Lymphoma 5. H/O CVA 2008  6. H/O VT/VF --St Jude ICD 2010  7. DMII  PLAN/DISCUSSION:  Stable. INR down. GI has seen.  Plan EGD and colon this am. Continue milrinone and heparin. While off coumadin and in house  it is probably best to do his R/L cath for pre-VAD w/u during this admit. Will plan to do in am. Orders written.     LOS: 3 days  Glori Bickers, MD 09/18/2014, 7:57 AM

## 2014-09-19 ENCOUNTER — Encounter (HOSPITAL_COMMUNITY)
Admission: AD | Disposition: A | Discharge status: Home / Self Care | Payer: Self-pay | Source: Ambulatory Visit | Attending: Internal Medicine

## 2014-09-19 ENCOUNTER — Encounter (HOSPITAL_COMMUNITY): Payer: Self-pay | Admitting: Internal Medicine

## 2014-09-19 ENCOUNTER — Inpatient Hospital Stay (HOSPITAL_COMMUNITY): Payer: Medicare Other

## 2014-09-19 DIAGNOSIS — I509 Heart failure, unspecified: Secondary | ICD-10-CM

## 2014-09-19 HISTORY — PX: LEFT AND RIGHT HEART CATHETERIZATION WITH CORONARY ANGIOGRAM: SHX5449

## 2014-09-19 HISTORY — PX: COLONOSCOPY: SHX5424

## 2014-09-19 LAB — POCT I-STAT 3, VENOUS BLOOD GAS (G3P V)
Acid-base deficit: 2 mmol/L (ref 0.0–2.0)
Acid-base deficit: 2 mmol/L (ref 0.0–2.0)
BICARBONATE: 23.4 meq/L (ref 20.0–24.0)
BICARBONATE: 23.4 meq/L (ref 20.0–24.0)
O2 Saturation: 68 %
O2 Saturation: 69 %
PCO2 VEN: 41.5 mmHg — AB (ref 45.0–50.0)
PH VEN: 7.361 — AB (ref 7.250–7.300)
PO2 VEN: 37 mmHg (ref 30.0–45.0)
TCO2: 25 mmol/L (ref 0–100)
TCO2: 25 mmol/L (ref 0–100)
pCO2, Ven: 41.2 mmHg — ABNORMAL LOW (ref 45.0–50.0)
pH, Ven: 7.36 — ABNORMAL HIGH (ref 7.250–7.300)
pO2, Ven: 37 mmHg (ref 30.0–45.0)

## 2014-09-19 LAB — POCT I-STAT 3, ART BLOOD GAS (G3+)
ACID-BASE DEFICIT: 4 mmol/L — AB (ref 0.0–2.0)
BICARBONATE: 21 meq/L (ref 20.0–24.0)
O2 Saturation: 96 %
PCO2 ART: 35.2 mmHg (ref 35.0–45.0)
PH ART: 7.383 (ref 7.350–7.450)
TCO2: 22 mmol/L (ref 0–100)
pO2, Arterial: 82 mmHg (ref 80.0–100.0)

## 2014-09-19 LAB — GLUCOSE, CAPILLARY
GLUCOSE-CAPILLARY: 66 mg/dL — AB (ref 70–99)
GLUCOSE-CAPILLARY: 139 mg/dL — AB (ref 70–99)
Glucose-Capillary: 178 mg/dL — ABNORMAL HIGH (ref 70–99)
Glucose-Capillary: 177 mg/dL — ABNORMAL HIGH (ref 70–99)

## 2014-09-19 LAB — CBC
HCT: 30.7 % — ABNORMAL LOW (ref 39.0–52.0)
Hemoglobin: 10.5 g/dL — ABNORMAL LOW (ref 13.0–17.0)
MCH: 29 pg (ref 26.0–34.0)
MCHC: 34.2 g/dL (ref 30.0–36.0)
MCV: 84.8 fL (ref 78.0–100.0)
PLATELETS: 254 10*3/uL (ref 150–400)
RBC: 3.62 MIL/uL — ABNORMAL LOW (ref 4.22–5.81)
RDW: 14.9 % (ref 11.5–15.5)
WBC: 6.2 10*3/uL (ref 4.0–10.5)

## 2014-09-19 LAB — POCT ACTIVATED CLOTTING TIME: Activated Clotting Time: 116 seconds

## 2014-09-19 LAB — HEPARIN LEVEL (UNFRACTIONATED): HEPARIN UNFRACTIONATED: 0.39 [IU]/mL (ref 0.30–0.70)

## 2014-09-19 LAB — PROTIME-INR
INR: 1.21 (ref 0.00–1.49)
Prothrombin Time: 15.4 seconds — ABNORMAL HIGH (ref 11.6–15.2)

## 2014-09-19 SURGERY — LEFT AND RIGHT HEART CATHETERIZATION WITH CORONARY ANGIOGRAM
Anesthesia: LOCAL

## 2014-09-19 SURGERY — COLONOSCOPY
Anesthesia: Monitor Anesthesia Care

## 2014-09-19 MED ORDER — ACETAMINOPHEN 325 MG PO TABS
650.0000 mg | ORAL_TABLET | ORAL | Status: DC | PRN
  Administered 2014-09-20 – 2014-09-21 (×3): 650 mg via ORAL
  Filled 2014-09-19 (×3): qty 2

## 2014-09-19 MED ORDER — WARFARIN - PHARMACIST DOSING INPATIENT
Freq: Every day | Status: DC
Start: 2014-09-19 — End: 2014-09-25
  Administered 2014-09-20 – 2014-09-24 (×5)

## 2014-09-19 MED ORDER — HEPARIN (PORCINE) IN NACL 100-0.45 UNIT/ML-% IJ SOLN
800.0000 [IU]/h | INTRAMUSCULAR | Status: DC
  Administered 2014-09-19 – 2014-09-23 (×4): 800 [IU]/h via INTRAVENOUS
  Filled 2014-09-19 (×8): qty 250

## 2014-09-19 MED ORDER — MIDAZOLAM HCL 2 MG/2ML IJ SOLN
INTRAMUSCULAR | Status: AC
  Filled 2014-09-19: qty 2

## 2014-09-19 MED ORDER — ONDANSETRON HCL 4 MG/2ML IJ SOLN: 4.0000 mg | Freq: Four times a day (QID) | INTRAMUSCULAR | Status: DC | PRN

## 2014-09-19 MED ORDER — SODIUM CHLORIDE 0.9 % IV SOLN
INTRAVENOUS | Status: AC
  Administered 2014-09-19: 13:00:00 via INTRAVENOUS

## 2014-09-19 MED ORDER — DEXTROSE 50 % IV SOLN
INTRAVENOUS | Status: AC
  Administered 2014-09-19: 25 mL
  Filled 2014-09-19: qty 50

## 2014-09-19 MED ORDER — NITROGLYCERIN 1 MG/10 ML FOR IR/CATH LAB
INTRA_ARTERIAL | Status: AC
  Filled 2014-09-19: qty 10

## 2014-09-19 MED ORDER — HEPARIN (PORCINE) IN NACL 2-0.9 UNIT/ML-% IJ SOLN
INTRAMUSCULAR | Status: AC
  Filled 2014-09-19: qty 1000

## 2014-09-19 MED ORDER — WARFARIN SODIUM 7.5 MG PO TABS
7.5000 mg | ORAL_TABLET | Freq: Once | ORAL | Status: AC
  Administered 2014-09-19: 7.5 mg via ORAL
  Filled 2014-09-19: qty 1

## 2014-09-19 MED ORDER — LIDOCAINE HCL (PF) 1 % IJ SOLN
INTRAMUSCULAR | Status: AC
  Filled 2014-09-19: qty 30

## 2014-09-19 MED ORDER — PROPOFOL INFUSION 10 MG/ML OPTIME
INTRAVENOUS | Status: DC | PRN
  Administered 2014-09-19: 50 ug/kg/min via INTRAVENOUS

## 2014-09-19 MED ORDER — LACTATED RINGERS IV SOLN
INTRAVENOUS | Status: DC | PRN
  Administered 2014-09-19: 10:00:00 via INTRAVENOUS

## 2014-09-19 MED ORDER — FENTANYL CITRATE 0.05 MG/ML IJ SOLN
INTRAMUSCULAR | Status: AC
  Filled 2014-09-19: qty 2

## 2014-09-19 NOTE — Anesthesia Preprocedure Evaluation (Addendum)
Anesthesia Evaluation  Patient identified by MRN, date of birth, ID band Patient awake    Reviewed: Allergy & Precautions, NPO status , Patient's Chart, lab work & pertinent test results  Airway Mallampati: I       Dental  (+) Missing,    Pulmonary          Cardiovascular hypertension, Pt. on medications + Peripheral Vascular Disease and +CHF + pacemaker + Cardiac Defibrillator     Neuro/Psych Depression    GI/Hepatic   Endo/Other  diabetes  Renal/GU      Musculoskeletal   Abdominal   Peds  Hematology  (+) anemia ,   Anesthesia Other Findings   Reproductive/Obstetrics                            Anesthesia Physical Anesthesia Plan  ASA: III  Anesthesia Plan: MAC   Post-op Pain Management:    Induction: Intravenous  Airway Management Planned: Simple Face Mask  Additional Equipment:   Intra-op Plan:   Post-operative Plan:   Informed Consent: I have reviewed the patients History and Physical, chart, labs and discussed the procedure including the risks, benefits and alternatives for the proposed anesthesia with the patient or authorized representative who has indicated his/her understanding and acceptance.   Dental advisory given  Plan Discussed with: Anesthesiologist  Anesthesia Plan Comments:         Anesthesia Quick Evaluation

## 2014-09-19 NOTE — Interval H&P Note (Signed)
History and Physical Interval Note:  09/19/2014 12:35 PM  Andre Tran  has presented today for surgery, with the diagnosis of chronic systolic heart failure  The various methods of treatment have been discussed with the patient and family. After consideration of risks, benefits and other options for treatment, the patient has consented to  Procedure(s): LEFT AND RIGHT HEART CATHETERIZATION WITH CORONARY ANGIOGRAM (N/A) as a surgical intervention .  The patient's history has been reviewed, patient examined, no change in status, stable for surgery.  I have reviewed the patient's chart and labs.  Questions were answered to the patient's satisfaction.     Harald Quevedo

## 2014-09-19 NOTE — Progress Notes (Signed)
  Echocardiogram 2D Echocardiogram has been performed.  Andre Tran 09/19/2014, 3:20 PM

## 2014-09-19 NOTE — H&P (View-Only) (Signed)
Subjective:   Feels fine. Levamir stopped due to hypoglycemia.  EGD normal x for small hiatal hernia. Colon prep inadequate. Repeat colonoscopy this am.   Will plan R and LHC later today.   Intake/Output Summary (Last 24 hours) at 09/19/14 0049 Last data filed at 09/18/14 2009  Gross per 24 hour  Intake    800 ml  Output   1950 ml  Net  -1150 ml    Current meds: . aspirin  81 mg Oral Pre-Cath  . atorvastatin  20 mg Oral q1800  . digoxin  0.125 mg Oral Daily  . [START ON 09/20/2014] glipiZIDE  5 mg Oral QAC breakfast  . insulin aspart  0-15 Units Subcutaneous TID WC  . losartan  50 mg Oral Daily  . magnesium oxide  400 mg Oral QHS  . [COMPLETED] peg 3350 powder  0.5 kit Oral Once  . sodium chloride  3 mL Intravenous Q12H  . sodium chloride  3 mL Intravenous Q12H  . spironolactone  25 mg Oral Daily   Infusions: . sodium chloride    . sodium chloride    . sodium chloride    . heparin 800 Units/hr (09/18/14 1958)  . milrinone 0.25 mcg/kg/min (09/18/14 1959)     Objective:  Blood pressure 96/63, pulse 98, temperature 97.7 F (36.5 C), temperature source Oral, resp. rate 18, height _0  (1.702 m), weight 74 kg (163 lb 2.3 oz), SpO2 100 %. Weight change:    Physical Exam: General: Well appearing. No resp difficulty HEENT: normal Neck: supple. JV flat . Carotids 2+ bilat; no bruits. No lymphadenopathy or thryomegaly appreciated. Cor: PMI nondisplaced. Regular rate & rhythm. No rubs, or murmurs. + S3 Lungs: clear Abdomen: soft, nontender, nondistended. No hepatosplenomegaly. No bruits or masses. Good bowel sounds. Extremities: no cyanosis, clubbing, rash, edema. RUE double lumen PICC Neuro: alert & orientedx3, cranial nerves grossly intact. moves all 4 extremities w/o difficulty. Affect pleasant  Telemetry:SR 80-90s  Lab Results: Basic Metabolic Panel:  Recent Labs Lab 09/15/14 1535 09/16/14 0130 09/17/14 0546 09/17/14 1230 09/18/14 0525  NA 134* 135  134*  --  132*  K 4.4 4.3 4.4  --  4.3  CL 99 100 100  --  101  CO2 _1 --  25  GLUCOSE 116* 115* 76 107* 86  BUN 24* 26* 23  --  14  CREATININE 1.31 1.22 1.24  --  1.03  CALCIUM 9.7 9.1 10.0  --  9.0  MG 2.1  --   --   --   --    Liver Function Tests:  Recent Labs Lab 09/15/14 1535  AST 26  ALT 21  ALKPHOS 95  BILITOT 0.7  PROT 7.8  ALBUMIN 4.1   No results for input(s): LIPASE, AMYLASE in the last 168 hours. No results for input(s): AMMONIA in the last 168 hours. CBC:  Recent Labs Lab 09/15/14 1535 09/16/14 0130 09/17/14 0546 09/18/14 0525  WBC 7.4 7.7 6.4 7.3  NEUTROABS 4.6  --   --   --   HGB 11.5* 10.5* 11.8* 10.4*  HCT 33.1* 30.6* 34.8* 30.2*  MCV 84.4 84.5 85.1 84.6  PLT 288 276 272 217   Cardiac Enzymes: No results for input(s): CKTOTAL, CKMB, CKMBINDEX, TROPONINI in the last 168 hours. BNP: Invalid input(s): POCBNP CBG:  Recent Labs Lab 09/17/14 2058 09/18/14 0611 09/18/14 1115 09/18/14 1614 09/18/14 2054  GLUCAP 104* 84 80 76 104*   Microbiology: Lab Results  Component Value  Date   CULT  06/21/2014    NO GROWTH 5 DAYS Performed at [facility]  06/21/2014    NO GROWTH 5 DAYS Performed at [facility] No Beta Hemolytic Streptococci Isolated 01/06/2013   CULT INSIGNIFICANT GROWTH 12/18/2011   CULT INSIGNIFICANT GROWTH 05/02/2007   No results for input(s): CULT, SDES in the last 168 hours.  Imaging: No results found.   ASSESSMENT:   1. Chronic Systolic Heart Failure on chronic milrinone 0.25 mcg  2. NICM - chemo induced adriamycin 3. 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 Non Hodgkins Lymphoma 5. H/O CVA 2008  6. H/O VT/VF --St Jude ICD 2010  7. DMII  PLAN/DISCUSSION:  EGD normal. For colonoscopy this am foloowed by R/L heart cath for VAD w/u. Continue milrinone. Off diuretics for now. Restart as needed. Levamir on hold due to low CBGs.    Continue heparin. Resume coumadin  tonight. Home when INR >= 2.0.    LOS: 4 days  Glori Bickers, MD 09/19/2014, 12:49 AM

## 2014-09-19 NOTE — Progress Notes (Signed)
ANTICOAGULATION CONSULT NOTE - Follow Up Consult  Pharmacy Consult for heparin and coumadin Indication: hx of CVA and LV thrombus  No Known Allergies  Patient Measurements: Height: 5\' 7"  (170.2 cm) Weight: 162 lb 7.7 oz (73.7 kg) IBW/kg (Calculated) : 66.1 Heparin Dosing Weight:   Vital Signs: Temp: 97.7 F (36.5 C) (03/11 1326) Temp Source: Oral (03/11 1326) BP: 118/69 mmHg (03/11 1326) Pulse Rate: 96 (03/11 1326)  Labs:  Recent Labs  09/17/14 0546  09/17/14 2018 09/18/14 0525 09/18/14 1715 09/19/14 0620  HGB 11.8*  --   --  10.4*  --  10.5*  HCT 34.8*  --   --  30.2*  --  30.7*  PLT 272  --   --  217  --  254  LABPROT 17.2*  --   --  17.2*  --  15.4*  INR 1.39  --   --  1.38  --  1.21  HEPARINUNFRC 0.24*  < > 0.87*  --  0.37 0.39  CREATININE 1.24  --   --  1.03  --   --   < > = values in this interval not displayed.  Estimated Creatinine Clearance: 79.3 mL/min (by C-G formula based on Cr of 1.03).   Medications:  Scheduled:  . atorvastatin  20 mg Oral q1800  . digoxin  0.125 mg Oral Daily  . [START ON 09/20/2014] glipiZIDE  5 mg Oral QAC breakfast  . insulin aspart  0-15 Units Subcutaneous TID WC  . losartan  50 mg Oral Daily  . magnesium oxide  400 mg Oral QHS  . sodium chloride  3 mL Intravenous Q12H  . spironolactone  25 mg Oral Daily   Infusions:  . sodium chloride    . milrinone 0.25 mcg/kg/min (09/19/14 1005)    Assessment: 54 yo male with hx of CVA and LV thrombus will be resumed on heparin and coumadin s/p cath (8hrs post sheath removal).  Patient was therapeutic on heparin @ 800 units/hr previously.  INR today is 1.21.  Per RN, sheath was removed at 1300.  Goal of Therapy:  Heparin level 0.3-0.7 units/ml; INR 2-3 Monitor platelets by anticoagulation protocol: Yes   Plan:  - resume heparin at 800 units/hr @ 800 units/hr @2100  - coumadin 7.5 mg po x1 - 6hr heparin level - daily heparin level, CBC and INR   Avis Mcmahill, Tsz-Yin 09/19/2014,3:36  PM

## 2014-09-19 NOTE — Progress Notes (Signed)
ANTICOAGULATION CONSULT NOTE  Pharmacy Consult for heparin  Indication: Hx CVA, Hx LV thrombus  No Known Allergies  Patient Measurements: Height: 5\' 7"  (170.2 cm) Weight: 162 lb 7.7 oz (73.7 kg) IBW/kg (Calculated) : 66.1   Vital Signs: Temp: 98.2 F (36.8 C) (03/11 0903) Temp Source: Oral (03/11 0903) BP: 122/91 mmHg (03/11 1051) Pulse Rate: 89 (03/11 1100)  Labs:  Recent Labs  09/17/14 0546  09/17/14 2018 09/18/14 0525 09/18/14 1715 09/19/14 0620  HGB 11.8*  --   --  10.4*  --  10.5*  HCT 34.8*  --   --  30.2*  --  30.7*  PLT 272  --   --  217  --  254  LABPROT 17.2*  --   --  17.2*  --  15.4*  INR 1.39  --   --  1.38  --  1.21  HEPARINUNFRC 0.24*  < > 0.87*  --  0.37 0.39  CREATININE 1.24  --   --  1.03  --   --   < > = values in this interval not displayed.  Estimated Creatinine Clearance: 79.3 mL/min (by C-G formula based on Cr of 1.03).  Assessment: 54 yo M admitted 09/15/2014 for heparin bridge while off Warfarin for EGD/colonoscopy for LVAD w/u.  Pharmacy consulted to dose heparin/warfarin  PMH: HFrEF 15%, NICU, adriamycin induced, Non hodgkins lymphoma, home milrinone, ICD, Hx CVA, Hx LV thrombus, iliac vein thrombus  AC:  Hx CVA, Hx LV thrombus, iliac vein thrombus  heparin gtt restarted 3/8, stop AM 3/9 for EGD,  3/10 and 3/11 for procedures, now 3/11 pm to resume heparin 8h after sheath out with no bolus and resume warfarin. cbc stable, iron studies WNL warfarin -INR 1.21,  7.5 mg today *PTA dose 5mg  TThSat, 7.5mg  all other days, INR goal 2.0-2.5 (per note 3/1)  ID: Afebrile, WBC WNL, no abx  CV: EF 15%  On: Atorva, dig, losartan, spiro, milrinone 0.25 PTA milrinone 0.25  Endo: dm, on home glip, SSI, glucose <250, lantus stopped dt hypoglycemia GI/Nutr: LFTs WNL - s/p gi prep  Neuro: awake alert  Renal: SCr stable, lytes ok - Mag ox  Pulm: RA  Best practices: hep Goal of Therapy:  INR 2-3 Heparin level 0.3-0.7 units/ml Monitor  platelets by anticoagulation protocol: Yes   Plan:  -Resume heparin at 800 units/hr 8h after sheath out -Follow up heparin level with am labs -Daily HL, CBC -Warfarin 7.5 mg tonight  Thank you for allowing pharmacy to be a part of this patients care team.  Rowe Robert Pharm.D., BCPS, AQ-Cardiology Clinical Pharmacist 09/19/2014 12:45 PM Pager: [phone] Phone: [phone]

## 2014-09-19 NOTE — Anesthesia Postprocedure Evaluation (Signed)
  Anesthesia Post-op Note  Patient: Andre Tran  Procedure(s) Performed: Procedure(s): COLONOSCOPY (N/A)  Patient Location: PACU  Anesthesia Type:MAC  Level of Consciousness: awake and alert   Airway and Oxygen Therapy: Patient Spontanous Breathing  Post-op Pain: none  Post-op Assessment: Post-op Vital signs reviewed  Post-op Vital Signs: Reviewed  Last Vitals:  Filed Vitals:   09/19/14 1100  BP:   Pulse: 89  Temp:   Resp: 18    Complications: No apparent anesthesia complications

## 2014-09-19 NOTE — CV Procedure (Signed)
Cardiac Cath Procedure Note  Indication: HF/LVAD eval  Procedures performed:  1) Right heart cathererization 2) Selective coronary angiography 3) Left heart catheterization   Description of procedure:     The risks and indication of the procedure were explained. Consent was signed and placed on the chart. An appropriate timeout was taken prior to the procedure. The right groin was prepped and draped in the routine sterile fashion and anesthetized with 1% local lidocaine.   A 5 FR arterial sheath was placed in the right femoral artery using a modified Seldinger technique. Standard catheters including a JL4, JR4 and angled pigtail were used. All catheter exchanges were made over a wire. A 7 FR venous sheath was placed in the right femoral vein using a modified Seldinger technique. A standard Swan-Ganz catheter was used for the procedure.   Complications:  None apparent  Findings:  Cath done post-colonoscopy on milrinone 0.25 mcg/kg/min  RA = 1 RV = 26/0/1 PA = 36/23 (29) PCW = 3 Fick cardiac output/index = 6.4/3.5 PVR = 4.1 WU FA sat = 96% PA sat = 68%, 69%  Ao Pressure: 105/68 (82) LV Pressure:  107/4/14  There was no signficant gradient across the aortic valve on pullback.  Left main: mildly calcified. otherwisenormal.  LAD: Large vessel. Wraps apex. Mild luminal plaque.  LCX: Made up of large ramus and 3 OMs. Mild ostial narrow in ramus. 20% stenosis in mid AV-groove. Otherwise normal.   RCA: Dominant vessel. Mild plaque proximally. Otherwise normal.    Assessment: 1. Minimal coronary atherosclerosis 2. Volume depletion but other wise well-compensated hemodynamics on milrinone  Plan/Discussion:  Hydrate. Proceed with VAD w/u. Resume heparin/coumadin 8 hours after sheath pull.   Glori Bickers, MD 12:36 PM

## 2014-09-19 NOTE — Anesthesia Procedure Notes (Signed)
Procedure Name: MAC Date/Time: 09/19/2014 10:12 AM Performed by: Rush Farmer E Pre-anesthesia Checklist: Patient identified, Suction available, Patient being monitored, Emergency Drugs available and Timeout performed Patient Re-evaluated:Patient Re-evaluated prior to inductionOxygen Delivery Method: Simple face mask Preoxygenation: Pre-oxygenation with 100% oxygen Intubation Type: IV induction Placement Confirmation: positive ETCO2 Dental Injury: Teeth and Oropharynx as per pre-operative assessment

## 2014-09-19 NOTE — Progress Notes (Signed)
Subjective:   Feels fine. Levamir stopped due to hypoglycemia.  EGD normal x for small hiatal hernia. Colon prep inadequate. Repeat colonoscopy this am.   Will plan R and LHC later today.   Intake/Output Summary (Last 24 hours) at 09/19/14 0049 Last data filed at 09/18/14 2009  Gross per 24 hour  Intake    800 ml  Output   1950 ml  Net  -1150 ml    Current meds: . aspirin  81 mg Oral Pre-Cath  . atorvastatin  20 mg Oral q1800  . digoxin  0.125 mg Oral Daily  . [START ON 09/20/2014] glipiZIDE  5 mg Oral QAC breakfast  . insulin aspart  0-15 Units Subcutaneous TID WC  . losartan  50 mg Oral Daily  . magnesium oxide  400 mg Oral QHS  . [COMPLETED] peg 3350 powder  0.5 kit Oral Once  . sodium chloride  3 mL Intravenous Q12H  . sodium chloride  3 mL Intravenous Q12H  . spironolactone  25 mg Oral Daily   Infusions: . sodium chloride    . sodium chloride    . sodium chloride    . heparin 800 Units/hr (09/18/14 1958)  . milrinone 0.25 mcg/kg/min (09/18/14 1959)     Objective:  Blood pressure 96/63, pulse 98, temperature 97.7 F (36.5 C), temperature source Oral, resp. rate 18, height _0  (1.702 m), weight 74 kg (163 lb 2.3 oz), SpO2 100 %. Weight change:    Physical Exam: General: Well appearing. No resp difficulty HEENT: normal Neck: supple. JV flat . Carotids 2+ bilat; no bruits. No lymphadenopathy or thryomegaly appreciated. Cor: PMI nondisplaced. Regular rate & rhythm. No rubs, or murmurs. + S3 Lungs: clear Abdomen: soft, nontender, nondistended. No hepatosplenomegaly. No bruits or masses. Good bowel sounds. Extremities: no cyanosis, clubbing, rash, edema. RUE double lumen PICC Neuro: alert & orientedx3, cranial nerves grossly intact. moves all 4 extremities w/o difficulty. Affect pleasant  Telemetry:SR 80-90s  Lab Results: Basic Metabolic Panel:  Recent Labs Lab 09/15/14 1535 09/16/14 0130 09/17/14 0546 09/17/14 1230 09/18/14 0525  NA 134* 135  134*  --  132*  K 4.4 4.3 4.4  --  4.3  CL 99 100 100  --  101  CO2 _1 --  25  GLUCOSE 116* 115* 76 107* 86  BUN 24* 26* 23  --  14  CREATININE 1.31 1.22 1.24  --  1.03  CALCIUM 9.7 9.1 10.0  --  9.0  MG 2.1  --   --   --   --    Liver Function Tests:  Recent Labs Lab 09/15/14 1535  AST 26  ALT 21  ALKPHOS 95  BILITOT 0.7  PROT 7.8  ALBUMIN 4.1   No results for input(s): LIPASE, AMYLASE in the last 168 hours. No results for input(s): AMMONIA in the last 168 hours. CBC:  Recent Labs Lab 09/15/14 1535 09/16/14 0130 09/17/14 0546 09/18/14 0525  WBC 7.4 7.7 6.4 7.3  NEUTROABS 4.6  --   --   --   HGB 11.5* 10.5* 11.8* 10.4*  HCT 33.1* 30.6* 34.8* 30.2*  MCV 84.4 84.5 85.1 84.6  PLT 288 276 272 217   Cardiac Enzymes: No results for input(s): CKTOTAL, CKMB, CKMBINDEX, TROPONINI in the last 168 hours. BNP: Invalid input(s): POCBNP CBG:  Recent Labs Lab 09/17/14 2058 09/18/14 0611 09/18/14 1115 09/18/14 1614 09/18/14 2054  GLUCAP 104* 84 80 76 104*   Microbiology: Lab Results  Component Value  Date   CULT  06/21/2014    NO GROWTH 5 DAYS Performed at [facility]  06/21/2014    NO GROWTH 5 DAYS Performed at [facility] No Beta Hemolytic Streptococci Isolated 01/06/2013   CULT INSIGNIFICANT GROWTH 12/18/2011   CULT INSIGNIFICANT GROWTH 05/02/2007   No results for input(s): CULT, SDES in the last 168 hours.  Imaging: No results found.   ASSESSMENT:   1. Chronic Systolic Heart Failure on chronic milrinone 0.25 mcg  2. NICM - chemo induced adriamycin 3. 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 Non Hodgkins Lymphoma 5. H/O CVA 2008  6. H/O VT/VF --St Jude ICD 2010  7. DMII  PLAN/DISCUSSION:  EGD normal. For colonoscopy this am foloowed by R/L heart cath for VAD w/u. Continue milrinone. Off diuretics for now. Restart as needed. Levamir on hold due to low CBGs.    Continue heparin. Resume coumadin  tonight. Home when INR >= 2.0.    LOS: 4 days  Glori Bickers, MD 09/19/2014, 12:49 AM

## 2014-09-19 NOTE — Progress Notes (Signed)
Results for DELAINE, CANTER (MRN [id number]) as of 09/19/2014 10:25  Ref. Range 09/18/2014 11:15 09/18/2014 16:14 09/18/2014 20:54 09/19/2014 05:54 09/19/2014 06:49  Glucose-Capillary Latest Range: 70-99 mg/dL 80 76 104 (H) 66 (L) 139 (H)  CBGs less than 100 mg/dl. CBG at 05:54 today was 66 mg/dl. Recommend decreasing Novolog correction scale to SENSITIVE TID & HS.  If continuing to have low blood sugars, discontinue the Glucotrol while in the hospital. Harvel Ricks RN BSN CDE

## 2014-09-19 NOTE — Transfer of Care (Signed)
Immediate Anesthesia Transfer of Care Note  Patient: ADIR SCHICKER  Procedure(s) Performed: Procedure(s): COLONOSCOPY (N/A)  Patient Location: Endoscopy Unit  Anesthesia Type:MAC  Level of Consciousness: awake, alert  and oriented  Airway & Oxygen Therapy: Patient Spontanous Breathing and Patient connected to face mask oxygen  Post-op Assessment: Report given to RN, Post -op Vital signs reviewed and stable and Patient moving all extremities  Post vital signs: Reviewed and stable  Last Vitals:  Filed Vitals:   09/19/14 1051  BP: 122/91  Pulse: 86  Temp:   Resp: 12    Complications: No apparent anesthesia complications

## 2014-09-19 NOTE — Op Note (Signed)
[facility] [place], [address]   COLONOSCOPY PROCEDURE REPORT  PATIENT: Andre Tran, Andre Tran  MR#: [id number] BIRTHDATE: July 31, 1960 , 30  yrs. old GENDER: male ENDOSCOPIST: [name], MD REFERRED LK:[id number] Karlyn Agee, M.D. PROCEDURE DATE:  09/19/2014 PROCEDURE:   Colonoscopy, diagnostic First Screening Colonoscopy - Avg.  risk and is 50 yrs.  old or older - No.  Prior Negative Screening - Now for repeat screening. N/A  History of Adenoma - Now for follow-up colonoscopy & has been > or = to 3 yrs.  N/A ASA CLASS:   Class IV INDICATIONS:unexplained normocytic anemia. MEDICATIONS: Monitored anesthesia care and Per Anesthesia  DESCRIPTION OF PROCEDURE:   After the risks benefits and alternatives of the procedure were thoroughly explained, informed consent was obtained.  The digital rectal exam revealed no rectal mass.   The Pentax Ped Colon [id number]  endoscope was introduced through the anus and advanced to the cecum, which was identified by both the appendix and ileocecal valve. No adverse events experienced.   The quality of the prep was (MoviPrep x 2 was used) fair in the cecum/ascending colon with some debris unable to be aspirated via scope despite irrigation and lavage.  The instrument was then slowly withdrawn as the colon was fully examined.      COLON FINDINGS: A normal appearing cecum, ileocecal valve, and appendiceal orifice were identified.  The ascending, transverse, descending, sigmoid colon, and rectum appeared unremarkable. Retroflexed views revealed no abnormalities. The time to cecum = 5 min Withdrawal time = 19 min   The scope was withdrawn and the procedure completed.  COMPLICATIONS: There were no immediate complications.  ENDOSCOPIC IMPRESSION: Normal colonoscopy  RECOMMENDATIONS: You should continue to follow colorectal cancer screening guidelines for "routine risk" patients with a repeat colonoscopy in 10  years.  eSigned:  [name], MD 09/19/2014 11:02 AM   cc: Jolaine Artist, MD and The Patient

## 2014-09-20 DIAGNOSIS — D509 Iron deficiency anemia, unspecified: Secondary | ICD-10-CM

## 2014-09-20 LAB — GLUCOSE, CAPILLARY
GLUCOSE-CAPILLARY: 100 mg/dL — AB (ref 70–99)
GLUCOSE-CAPILLARY: 194 mg/dL — AB (ref 70–99)
Glucose-Capillary: 153 mg/dL — ABNORMAL HIGH (ref 70–99)
Glucose-Capillary: 88 mg/dL (ref 70–99)

## 2014-09-20 LAB — HEPARIN LEVEL (UNFRACTIONATED): Heparin Unfractionated: 0.36 IU/mL (ref 0.30–0.70)

## 2014-09-20 LAB — CBC
HCT: 30.3 % — ABNORMAL LOW (ref 39.0–52.0)
Hemoglobin: 10.4 g/dL — ABNORMAL LOW (ref 13.0–17.0)
MCH: 29.1 pg (ref 26.0–34.0)
MCHC: 34.3 g/dL (ref 30.0–36.0)
MCV: 84.9 fL (ref 78.0–100.0)
Platelets: 243 10*3/uL (ref 150–400)
RBC: 3.57 MIL/uL — AB (ref 4.22–5.81)
RDW: 14.8 % (ref 11.5–15.5)
WBC: 6 10*3/uL (ref 4.0–10.5)

## 2014-09-20 LAB — PROTIME-INR
INR: 1.08 (ref 0.00–1.49)
PROTHROMBIN TIME: 14.1 s (ref 11.6–15.2)

## 2014-09-20 MED ORDER — WARFARIN SODIUM 7.5 MG PO TABS
7.5000 mg | ORAL_TABLET | Freq: Once | ORAL | Status: AC
  Administered 2014-09-20: 7.5 mg via ORAL
  Filled 2014-09-20: qty 1

## 2014-09-20 NOTE — Progress Notes (Signed)
CARDIAC REHAB PHASE I   PRE:  Rate/Rhythm: 107 ST  BP:  Supine:   Sitting: 76/60  Standing:    SaO2: 100% RA  MODE:  Ambulation: 150 ft   POST:  Rate/Rhythm: 129  BP:  Supine:   Sitting: 89/58  Standing:    SaO2: 100% RA  [id number] Pt had been up in hall walking earlier with his wife. Pt ambulated with me 150 ft, fairly independent, gait steady, no c/o. BP low before and after walk, pt asymptomatic throughout.   Sol Passer, MS, ACSM CCEP

## 2014-09-20 NOTE — Progress Notes (Addendum)
Subjective:   Feels fine. Levamir stopped due to hypoglycemia.  EGD, Colonoscopy and R/L heart cath all looked. Fine. Back on heparin.    Intake/Output Summary (Last 24 hours) at 09/20/14 0953 Last data filed at 09/20/14 0840  Gross per 24 hour  Intake    646 ml  Output    700 ml  Net    -54 ml    Current meds: . atorvastatin  20 mg Oral q1800  . digoxin  0.125 mg Oral Daily  . glipiZIDE  5 mg Oral QAC breakfast  . insulin aspart  0-15 Units Subcutaneous TID WC  . losartan  50 mg Oral Daily  . magnesium oxide  400 mg Oral QHS  . sodium chloride  3 mL Intravenous Q12H  . spironolactone  25 mg Oral Daily  . Warfarin - Pharmacist Dosing Inpatient   Does not apply q1800   Infusions: . heparin 800 Units/hr (09/19/14 2128)  . milrinone 0.25 mcg/kg/min (09/20/14 0332)     Objective:  Blood pressure 97/55, pulse 109, temperature 97.9 F (36.6 C), temperature source Oral, resp. rate 18, height 5\' 7"  (1.702 m), weight 74.1 kg (163 lb 5.8 oz), SpO2 98 %. Weight change: 0.4 kg (14.1 oz)   Physical Exam: General: Well appearing. No resp difficulty HEENT: normal Neck: supple. JV flat . Carotids 2+ bilat; no bruits. No lymphadenopathy or thryomegaly appreciated. Cor: PMI nondisplaced. Regular rate & rhythm. No rubs, or murmurs. + S3 Lungs: clear Abdomen: soft, nontender, nondistended. No hepatosplenomegaly. No bruits or masses. Good bowel sounds. Extremities: no cyanosis, clubbing, rash, edema. RUE double lumen PICC R groin site ok Neuro: alert & orientedx3, cranial nerves grossly intact. moves all 4 extremities w/o difficulty. Affect pleasant  Telemetry:SR 80-90s  Lab Results: Basic Metabolic Panel:  Recent Labs Lab 09/15/14 1535 09/16/14 0130 09/17/14 0546 09/17/14 1230 09/18/14 0525  NA 134* 135 134*  --  132*  K 4.4 4.3 4.4  --  4.3  CL 99 100 100  --  101  CO2 27 26 27   --  25  GLUCOSE 116* 115* 76 107* 86  BUN 24* 26* 23  --  14  CREATININE 1.31 1.22 1.24   --  1.03  CALCIUM 9.7 9.1 10.0  --  9.0  MG 2.1  --   --   --   --    Liver Function Tests:  Recent Labs Lab 09/15/14 1535  AST 26  ALT 21  ALKPHOS 95  BILITOT 0.7  PROT 7.8  ALBUMIN 4.1   No results for input(s): LIPASE, AMYLASE in the last 168 hours. No results for input(s): AMMONIA in the last 168 hours. CBC:  Recent Labs Lab 09/15/14 1535 09/16/14 0130 09/17/14 0546 09/18/14 0525 09/19/14 0620 09/20/14 0657  WBC 7.4 7.7 6.4 7.3 6.2 6.0  NEUTROABS 4.6  --   --   --   --   --   HGB 11.5* 10.5* 11.8* 10.4* 10.5* 10.4*  HCT 33.1* 30.6* 34.8* 30.2* 30.7* 30.3*  MCV 84.4 84.5 85.1 84.6 84.8 84.9  PLT 288 276 272 217 254 243   Cardiac Enzymes: No results for input(s): CKTOTAL, CKMB, CKMBINDEX, TROPONINI in the last 168 hours. BNP: Invalid input(s): POCBNP CBG:  Recent Labs Lab 09/19/14 0554 09/19/14 0649 09/19/14 1618 09/19/14 2151 09/20/14 0607  GLUCAP 66* 139* 178* 177* 153*   Microbiology: Lab Results  Component Value Date   CULT  06/21/2014    NO GROWTH 5 DAYS Performed at [facility]    CULT  06/21/2014    NO GROWTH 5 DAYS Performed at [facility] No Beta Hemolytic Streptococci Isolated 01/06/2013   CULT INSIGNIFICANT GROWTH 12/18/2011   CULT INSIGNIFICANT GROWTH 05/02/2007   No results for input(s): CULT, SDES in the last 168 hours.  Imaging: No results found.   ASSESSMENT:   1. Chronic Systolic Heart Failure on chronic milrinone 0.25 mcg  2. NICM - chemo induced adriamycin 3. 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 Non Hodgkins Lymphoma 5. H/O CVA 2008  6. H/O VT/VF --St Jude ICD 2010  7. DMII  PLAN/DISCUSSION:  EGD & colon ok. R/L heart cath looked very good. Discussed timing of VAD versus continuing on milrinone for now. He wants to proceed with VAD as soon as feasible.    Continue heparin/coumadin. Home when INR ~  2.0.    LOS: 5 days  Glori Bickers, MD 09/20/2014, 9:53 AM

## 2014-09-20 NOTE — Progress Notes (Signed)
Pt off unit with wife at this time.

## 2014-09-20 NOTE — Progress Notes (Signed)
Pt up ambulating with wife at this time; no needs voiced; will cont. To monitor.

## 2014-09-20 NOTE — Progress Notes (Signed)
Pt and wife walking at this time to solarium; pt ok to leave unit per MD order; will cont. To monitor.

## 2014-09-20 NOTE — Progress Notes (Signed)
ANTICOAGULATION CONSULT NOTE - Follow Up Consult  Pharmacy Consult for heparin and coumadin Indication: hx of CVA and LV thrombus  No Known Allergies  Patient Measurements: Height: 5\' 7"  (170.2 cm) Weight: 163 lb 5.8 oz (74.1 kg) IBW/kg (Calculated) : 66.1 Heparin Dosing Weight:   Vital Signs: Temp: 97.9 F (36.6 C) (03/12 0428) Temp Source: Oral (03/12 0428) BP: 97/55 mmHg (03/12 0428) Pulse Rate: 109 (03/12 0428)  Labs:  Recent Labs  09/18/14 0525 09/18/14 1715 09/19/14 0620 09/20/14 0657  HGB 10.4*  --  10.5* 10.4*  HCT 30.2*  --  30.7* 30.3*  PLT 217  --  254 243  LABPROT 17.2*  --  15.4* 14.1  INR 1.38  --  1.21 1.08  HEPARINUNFRC  --  0.37 0.39 0.36  CREATININE 1.03  --   --   --     Estimated Creatinine Clearance: 79.3 mL/min (by C-G formula based on Cr of 1.03).   Medications:  Scheduled:  . atorvastatin  20 mg Oral q1800  . digoxin  0.125 mg Oral Daily  . glipiZIDE  5 mg Oral QAC breakfast  . insulin aspart  0-15 Units Subcutaneous TID WC  . losartan  50 mg Oral Daily  . magnesium oxide  400 mg Oral QHS  . sodium chloride  3 mL Intravenous Q12H  . spironolactone  25 mg Oral Daily  . Warfarin - Pharmacist Dosing Inpatient   Does not apply q1800   Infusions:  . heparin 800 Units/hr (09/19/14 2128)  . milrinone 0.25 mcg/kg/min (09/20/14 5436)    Assessment: 54 yo male with hx of CVA and LV thrombus resumed on heparin and coumadin s/p cath yesterday.  Patient remains therapeutic on heparin @ 800 units/hr.  INR today is 1.08.  Coumadin was resumed last night after being on hold for several days due to procedures.  Goal of Therapy:  Heparin level 0.3-0.7 units/ml; INR 2-2.5 (per MD note 3/1) Monitor platelets by anticoagulation protocol: Yes   Plan:  - Continue heparin 800 units/hr. - Coumadin 7.5 mg po x1 - Daily heparin level, CBC and INR  Uvaldo Rising, BCPS  Clinical Pharmacist Pager [phone]  09/20/2014 10:58 AM

## 2014-09-21 DIAGNOSIS — Z7901 Long term (current) use of anticoagulants: Secondary | ICD-10-CM

## 2014-09-21 DIAGNOSIS — Z5181 Encounter for therapeutic drug level monitoring: Secondary | ICD-10-CM

## 2014-09-21 LAB — CBC
HEMATOCRIT: 32 % — AB (ref 39.0–52.0)
HEMOGLOBIN: 11 g/dL — AB (ref 13.0–17.0)
MCH: 29.4 pg (ref 26.0–34.0)
MCHC: 34.4 g/dL (ref 30.0–36.0)
MCV: 85.6 fL (ref 78.0–100.0)
Platelets: 272 10*3/uL (ref 150–400)
RBC: 3.74 MIL/uL — ABNORMAL LOW (ref 4.22–5.81)
RDW: 15 % (ref 11.5–15.5)
WBC: 7.3 10*3/uL (ref 4.0–10.5)

## 2014-09-21 LAB — HEPARIN LEVEL (UNFRACTIONATED): Heparin Unfractionated: 0.47 IU/mL (ref 0.30–0.70)

## 2014-09-21 LAB — GLUCOSE, CAPILLARY
GLUCOSE-CAPILLARY: 135 mg/dL — AB (ref 70–99)
GLUCOSE-CAPILLARY: 128 mg/dL — AB (ref 70–99)
Glucose-Capillary: 111 mg/dL — ABNORMAL HIGH (ref 70–99)
Glucose-Capillary: 121 mg/dL — ABNORMAL HIGH (ref 70–99)

## 2014-09-21 LAB — PROTIME-INR
INR: 1.09 (ref 0.00–1.49)
Prothrombin Time: 14.2 seconds (ref 11.6–15.2)

## 2014-09-21 MED ORDER — WARFARIN SODIUM 10 MG PO TABS
10.0000 mg | ORAL_TABLET | Freq: Once | ORAL | Status: AC
  Administered 2014-09-21: 10 mg via ORAL
  Filled 2014-09-21: qty 1

## 2014-09-21 NOTE — Progress Notes (Signed)
Pt up ambulating in hallway at this time independently; no needs voiced; will cont. To monitor.

## 2014-09-21 NOTE — Progress Notes (Addendum)
ANTICOAGULATION CONSULT NOTE - Follow Up Consult  Pharmacy Consult for heparin and coumadin Indication: hx of CVA and LV thrombus  No Known Allergies  Patient Measurements: Height: 5\' 7"  (170.2 cm) Weight: 164 lb 4.8 oz (74.526 kg) IBW/kg (Calculated) : 66.1 Heparin Dosing Weight:   Vital Signs: Temp: 97.9 F (36.6 C) (03/13 0524) Temp Source: Oral (03/13 0524) BP: 99/53 mmHg (03/13 0524) Pulse Rate: 102 (03/13 0524)  Labs:  Recent Labs  09/19/14 0620 09/20/14 0657 09/21/14 0535  HGB 10.5* 10.4* 11.0*  HCT 30.7* 30.3* 32.0*  PLT 254 243 272  LABPROT 15.4* 14.1  --   INR 1.21 1.08  --   HEPARINUNFRC 0.39 0.36 0.47    Estimated Creatinine Clearance: 79.3 mL/min (by C-G formula based on Cr of 1.03).   Medications:  Scheduled:  . atorvastatin  20 mg Oral q1800  . digoxin  0.125 mg Oral Daily  . glipiZIDE  5 mg Oral QAC breakfast  . insulin aspart  0-15 Units Subcutaneous TID WC  . losartan  50 mg Oral Daily  . magnesium oxide  400 mg Oral QHS  . sodium chloride  3 mL Intravenous Q12H  . spironolactone  25 mg Oral Daily  . Warfarin - Pharmacist Dosing Inpatient   Does not apply q1800   Infusions:  . heparin 800 Units/hr (09/20/14 1220)  . milrinone 0.25 mcg/kg/min (09/21/14 0018)    Assessment: 54 yo male with hx of CVA and LV thrombus resumed on heparin and coumadin s/p cath yesterday.  Patient remains therapeutic on heparin @ 800 units/hr.  INR today remains subtherapeutic, but starting to increase.  Coumadin was resumed 3/11 after being on hold for several days due to procedures.  Goal of Therapy:  Heparin level 0.3-0.7 units/ml; INR 2-2.5 (per MD note 3/1) Monitor platelets by anticoagulation protocol: Yes   Plan:  - Continue heparin 800 units/hr. - Coumadin 10 mg x 1 tonight.  Will likely need to reduce dose tomorrow, I expect INR will begin to move quickly soon. - Daily heparin level, CBC and INR  Uvaldo Rising, BCPS  Clinical  Pharmacist Pager [phone]  09/21/2014 11:24 AM

## 2014-09-21 NOTE — Progress Notes (Signed)
Patient ID: Andre Tran, male   DOB: 12/10/1960, 54 y.o.   MRN: [id number]    Patient Name: Andre Tran Date of Encounter: 09/21/2014     Active Problems:   Chronic diastolic heart failure, NYHA class 4   Iron deficiency anemia due to chronic blood loss   Absolute anemia   Palliative care encounter   Hypoglycemia associated with diabetes    SUBJECTIVE  No chest pain or sob. "I can't go home until my blood thins out."  CURRENT MEDS . atorvastatin  20 mg Oral q1800  . digoxin  0.125 mg Oral Daily  . glipiZIDE  5 mg Oral QAC breakfast  . insulin aspart  0-15 Units Subcutaneous TID WC  . losartan  50 mg Oral Daily  . magnesium oxide  400 mg Oral QHS  . sodium chloride  3 mL Intravenous Q12H  . spironolactone  25 mg Oral Daily  . Warfarin - Pharmacist Dosing Inpatient   Does not apply q1800    OBJECTIVE  Filed Vitals:   09/20/14 0428 09/20/14 1513 09/20/14 2108 09/21/14 0524  BP: 97/55 95/58 102/65 99/53  Pulse: 109 83 101 102  Temp: 97.9 F (36.6 C) 97.8 F (36.6 C) 98.1 F (36.7 C) 97.9 F (36.6 C)  TempSrc: Oral Oral Oral Oral  Resp: 18 19 18 18   Height:      Weight: 163 lb 5.8 oz (74.1 kg)   164 lb 4.8 oz (74.526 kg)  SpO2: 98% 100% 100% 100%    Intake/Output Summary (Last 24 hours) at 09/21/14 1010 Last data filed at 09/21/14 0948  Gross per 24 hour  Intake    921 ml  Output   1275 ml  Net   -354 ml   Filed Weights   09/19/14 0440 09/20/14 0428 09/21/14 0524  Weight: 162 lb 7.7 oz (73.7 kg) 163 lb 5.8 oz (74.1 kg) 164 lb 4.8 oz (74.526 kg)    PHYSICAL EXAM  General: Pleasant, NAD. Neuro: Alert and oriented X 3. Moves all extremities spontaneously. Psych: Normal affect. HEENT:  Normal  Neck: Supple without bruits or JVD. Lungs:  Resp regular and unlabored, CTA. Heart: RRR no s3, s4, or murmurs. Abdomen: Soft, non-tender, non-distended, BS + x 4.  Extremities: No clubbing, cyanosis or edema. DP/PT/Radials 2+ and equal bilaterally.  Accessory  Clinical Findings  CBC  Recent Labs  09/20/14 0657 09/21/14 0535  WBC 6.0 7.3  HGB 10.4* 11.0*  HCT 30.3* 32.0*  MCV 84.9 85.6  PLT 243 474   Basic Metabolic Panel No results for input(s): NA, K, CL, CO2, GLUCOSE, BUN, CREATININE, CALCIUM, MG, PHOS in the last 72 hours. Liver Function Tests No results for input(s): AST, ALT, ALKPHOS, BILITOT, PROT, ALBUMIN in the last 72 hours. No results for input(s): LIPASE, AMYLASE in the last 72 hours. Cardiac Enzymes No results for input(s): CKTOTAL, CKMB, CKMBINDEX, TROPONINI in the last 72 hours. BNP Invalid input(s): POCBNP D-Dimer No results for input(s): DDIMER in the last 72 hours. Hemoglobin A1C No results for input(s): HGBA1C in the last 72 hours. Fasting Lipid Panel No results for input(s): CHOL, HDL, LDLCALC, TRIG, CHOLHDL, LDLDIRECT in the last 72 hours. Thyroid Function Tests No results for input(s): TSH, T4TOTAL, T3FREE, THYROIDAB in the last 72 hours.  Invalid input(s): FREET3  TELE  nsr  Radiology/Studies  Dg Chest 2 View  09/08/2014   CLINICAL DATA:  Currently asymptomatic, known mural thrombus and previous history of lymphoma  EXAM: CHEST  2 VIEW  COMPARISON:  Portable chest x-ray of June 19, 2014  FINDINGS: The lungs are adequately inflated and clear. The heart and pulmonary vascularity are normal. There is no pleural effusion or pneumothorax. The bony thorax is unremarkable. The permanent pacemaker defibrillator is in appropriate position radiographically. The right-sided PICC line tip projects over the proximal portion of the SVC.  IMPRESSION: There is no active cardiopulmonary disease.   Electronically Signed   By: David  Martinique   On: 09/08/2014 13:02    ASSESSMENT AND PLAN  1. Chronic systolic heart failure 2. Admit for LVAD workup, s/p cathterization 3. Awaiting INR to become therapuetic, continue IV heparin.  Avyan Livesay,M.D.  09/21/2014 10:10 AM

## 2014-09-22 ENCOUNTER — Encounter (HOSPITAL_COMMUNITY): Payer: Self-pay | Admitting: Internal Medicine

## 2014-09-22 DIAGNOSIS — D500 Iron deficiency anemia secondary to blood loss (chronic): Secondary | ICD-10-CM

## 2014-09-22 LAB — PROTIME-INR
INR: 1.26 (ref 0.00–1.49)
Prothrombin Time: 15.9 seconds — ABNORMAL HIGH (ref 11.6–15.2)

## 2014-09-22 LAB — CBC
HEMATOCRIT: 30.1 % — AB (ref 39.0–52.0)
Hemoglobin: 10.7 g/dL — ABNORMAL LOW (ref 13.0–17.0)
MCH: 30.1 pg (ref 26.0–34.0)
MCHC: 35.5 g/dL (ref 30.0–36.0)
MCV: 84.6 fL (ref 78.0–100.0)
PLATELETS: 256 10*3/uL (ref 150–400)
RBC: 3.56 MIL/uL — ABNORMAL LOW (ref 4.22–5.81)
RDW: 15 % (ref 11.5–15.5)
WBC: 8.3 10*3/uL (ref 4.0–10.5)

## 2014-09-22 LAB — GLUCOSE, CAPILLARY
GLUCOSE-CAPILLARY: 134 mg/dL — AB (ref 70–99)
GLUCOSE-CAPILLARY: 125 mg/dL — AB (ref 70–99)
Glucose-Capillary: 113 mg/dL — ABNORMAL HIGH (ref 70–99)
Glucose-Capillary: 169 mg/dL — ABNORMAL HIGH (ref 70–99)

## 2014-09-22 LAB — HEPARIN LEVEL (UNFRACTIONATED): Heparin Unfractionated: 0.53 IU/mL (ref 0.30–0.70)

## 2014-09-22 MED ORDER — WARFARIN SODIUM 10 MG PO TABS
10.0000 mg | ORAL_TABLET | Freq: Once | ORAL | Status: AC
  Administered 2014-09-22: 10 mg via ORAL
  Filled 2014-09-22: qty 1

## 2014-09-22 MED ORDER — OXYCODONE-ACETAMINOPHEN 5-325 MG PO TABS
1.0000 | ORAL_TABLET | Freq: Four times a day (QID) | ORAL | Status: DC | PRN
  Administered 2014-09-22: 1 via ORAL
  Filled 2014-09-22: qty 2

## 2014-09-22 NOTE — Care Management Note (Signed)
    Page 1 of 2   09/25/2014     3:22:45 PM CARE MANAGEMENT NOTE 09/25/2014  Patient:  Andre Tran, Andre Tran   Account Number:  [id number]  Date Initiated:  09/15/2014  Documentation initiated by:  [facility]  Subjective/Objective Assessment:   anemia, LVAD workup     Action/Plan:   Anticipated DC Date:  09/25/2014   Anticipated DC Plan:  [place]  CM consult      [facility] Choice  Resumption Of Svcs/PTA Provider   Choice offered to / List presented to:  C-1 Patient        [place] arranged  HH-1 RN  [place].   Status of service:  Completed, signed off Medicare Important Message given?  YES (If response is "NO", the following Medicare IM given date fields will be blank) Date Medicare IM given:  09/22/2014 Medicare IM given by:  Marvetta Gibbons Date Additional Medicare IM given:  09/25/2014 Additional Medicare IM given by:  Marvetta Gibbons  Discharge Disposition:  [place]  Per UR Regulation:  Reviewed for med. necessity/level of care/duration of stay  If discussed at [place] of Stay Meetings, dates discussed:   09/23/2014  09/25/2014    Comments:  Contacts:  Taylor,Dana Spouse [phone]  09/25/14- 1000- Marvetta Gibbons RN, BSN [phone] Pt for d/c home today, spoke with [place] [facility]) to notify of discharge and need for IV milrinone- Pam with [facility] to come and hook pt up to home IV Milrinone prior to discharge.  09/22/14- 1300- Marvetta Gibbons RN, BSN [phone] Per Amy with HF team- pt may d/c home tomorrow- will resume IV Milrinone gtt for home with [facility]- call made to [facility] with [facility] regarding need to resume [facility] services for IV Milrinone- HH orders have been placed- pt will also need 2-3 day of Lovenox injections- spoke with pt at bedside- per pt he has 3 syringes at home- will need another prescription for Lovenox in order to have enough to bridge 2-3 days ( he reports  that he gets them 10 per box). NCM to f/u in AM for any further needs.  09/15/2014 1355 Pt active with [facility] for Milrinone gtt. Waiting final recommendations for home. Jonnie Finner RN CCM Case Mgmt phone [phone]

## 2014-09-22 NOTE — Progress Notes (Signed)
ANTICOAGULATION CONSULT NOTE - Follow Up Consult  Pharmacy Consult for heparin and coumadin Indication: hx of CVA and LV thrombus  No Known Allergies  Patient Measurements: Height: 5\' 7"  (170.2 cm) Weight: 164 lb 6.4 oz (74.571 kg) IBW/kg (Calculated) : 66.1 Heparin Dosing Weight:   Vital Signs: Temp: 97.9 F (36.6 C) (03/14 0459) Temp Source: Oral (03/14 0459) BP: 115/66 mmHg (03/14 0459) Pulse Rate: 105 (03/14 0951)  Labs:  Recent Labs  09/20/14 0657 09/21/14 0535 09/22/14 0546  HGB 10.4* 11.0* 10.7*  HCT 30.3* 32.0* 30.1*  PLT 243 272 256  LABPROT 14.1 14.2 15.9*  INR 1.08 1.09 1.26  HEPARINUNFRC 0.36 0.47 0.53    Estimated Creatinine Clearance: 79.3 mL/min (by C-G formula based on Cr of 1.03).   Medications:  Scheduled:  . atorvastatin  20 mg Oral q1800  . digoxin  0.125 mg Oral Daily  . glipiZIDE  5 mg Oral QAC breakfast  . insulin aspart  0-15 Units Subcutaneous TID WC  . losartan  50 mg Oral Daily  . magnesium oxide  400 mg Oral QHS  . sodium chloride  3 mL Intravenous Q12H  . spironolactone  25 mg Oral Daily  . Warfarin - Pharmacist Dosing Inpatient   Does not apply q1800   Infusions:  . heparin 800 Units/hr (09/21/14 2204)  . milrinone 0.25 mcg/kg/min (09/21/14 1941)    Assessment: 54 yo male with hx of CVA and LV thrombus resumed on heparin and coumadin s/p cath 3/11.  Patient remains therapeutic HL 0.53 on heparin drip at 800 units/hr.  INR today remains subtherapeutic 1.26.  Coumadin was resumed 3/11 after being on hold for several days due to procedures.  Goal of Therapy:  Heparin level 0.3-0.7 units/ml; INR 2-2.5 (per MD note 3/1) Monitor platelets by anticoagulation protocol: Yes   Plan:  - Continue heparin 800 units/hr. - Coumadin 10 mg x 1 tonight.   - Daily heparin level, CBC and INR    Bonnita Nasuti Pharm.D. CPP, BCPS Clinical Pharmacist [phone] 09/22/2014 11:42 AM

## 2014-09-22 NOTE — Progress Notes (Signed)
CARDIAC REHAB PHASE I   PRE:  Rate/Rhythm: 86 SR  BP:  Supine:   Sitting: 108/68  Standing:    SaO2: 100 RA  MODE:  Ambulation: 2130 ft   POST:  Rate/Rhythm: 139 ST  BP:  Supine:   Sitting: 104/79  Standing:    SaO2: 98 RA 0958-1045 Pt tolerated ambulation well. He used IV pole to hold on to walk. He was able to walk 2130 feet. He c/o of right hip pain by end of walk. VS stable Pt back to chair after walk with call light in reach. Reviewed CHF education with pt. He voices understanding. We discussed daily weights,CHF zones low sodium diet and low carb, when to call MD and 911.  Rodney Langton RN 09/22/2014 10:45 AM

## 2014-09-22 NOTE — Progress Notes (Signed)
Subjective:  Denies SOB/Orthopnea.   EGD, Colonoscopy and R/L heart cath all looked. Fine. Back on heparin.    INR 1.26   Intake/Output Summary (Last 24 hours) at 09/22/14 1256 Last data filed at 09/22/14 0900  Gross per 24 hour  Intake    610 ml  Output   1750 ml  Net  -1140 ml    Current meds: . atorvastatin  20 mg Oral q1800  . digoxin  0.125 mg Oral Daily  . glipiZIDE  5 mg Oral QAC breakfast  . insulin aspart  0-15 Units Subcutaneous TID WC  . losartan  50 mg Oral Daily  . magnesium oxide  400 mg Oral QHS  . sodium chloride  3 mL Intravenous Q12H  . spironolactone  25 mg Oral Daily  . Warfarin - Pharmacist Dosing Inpatient   Does not apply q1800   Infusions: . heparin 800 Units/hr (09/21/14 2204)  . milrinone 0.25 mcg/kg/min (09/21/14 1941)     Objective:  Blood pressure 115/66, pulse 105, temperature 97.9 F (36.6 C), temperature source Oral, resp. rate 18, height 5\' 7"  (1.702 m), weight 164 lb 6.4 oz (74.571 kg), SpO2 99 %. Weight change: 1.6 oz (0.045 kg)   Physical Exam: General: Well appearing. No resp difficulty HEENT: normal Neck: supple. JVP  flat . Carotids 2+ bilat; no bruits. No lymphadenopathy or thryomegaly appreciated. Cor: PMI nondisplaced. Regular rate & rhythm. No rubs, or murmurs. + S3 Lungs: clear Abdomen: soft, nontender, nondistended. No hepatosplenomegaly. No bruits or masses. Good bowel sounds. Extremities: no cyanosis, clubbing, rash, edema. RUE double lumen PICC R groin site ok Neuro: alert & orientedx3, cranial nerves grossly intact. moves all 4 extremities w/o difficulty. Affect pleasant  Telemetry:SR 80-90s  Lab Results: Basic Metabolic Panel:  Recent Labs Lab 09/15/14 1535 09/16/14 0130 09/17/14 0546 09/17/14 1230 09/18/14 0525  NA 134* 135 134*  --  132*  K 4.4 4.3 4.4  --  4.3  CL 99 100 100  --  101  CO2 27 26 27   --  25  GLUCOSE 116* 115* 76 107* 86  BUN 24* 26* 23  --  14  CREATININE 1.31 1.22 1.24  --   1.03  CALCIUM 9.7 9.1 10.0  --  9.0  MG 2.1  --   --   --   --    Liver Function Tests:  Recent Labs Lab 09/15/14 1535  AST 26  ALT 21  ALKPHOS 95  BILITOT 0.7  PROT 7.8  ALBUMIN 4.1   No results for input(s): LIPASE, AMYLASE in the last 168 hours. No results for input(s): AMMONIA in the last 168 hours. CBC:  Recent Labs Lab 09/15/14 1535  09/18/14 0525 09/19/14 0620 09/20/14 0657 09/21/14 0535 09/22/14 0546  WBC 7.4  < > 7.3 6.2 6.0 7.3 8.3  NEUTROABS 4.6  --   --   --   --   --   --   HGB 11.5*  < > 10.4* 10.5* 10.4* 11.0* 10.7*  HCT 33.1*  < > 30.2* 30.7* 30.3* 32.0* 30.1*  MCV 84.4  < > 84.6 84.8 84.9 85.6 84.6  PLT 288  < > 217 254 243 272 256  < > = values in this interval not displayed. Cardiac Enzymes: No results for input(s): CKTOTAL, CKMB, CKMBINDEX, TROPONINI in the last 168 hours. BNP: Invalid input(s): POCBNP CBG:  Recent Labs Lab 09/21/14 1124 09/21/14 1634 09/21/14 2136 09/22/14 0620 09/22/14 1116  GLUCAP 128* 111* 121* 113* 134*  Microbiology: Lab Results  Component Value Date   CULT  06/21/2014    NO GROWTH 5 DAYS Performed at [place]  06/21/2014    NO GROWTH 5 DAYS Performed at [place] No Beta Hemolytic Streptococci Isolated 01/06/2013   CULT INSIGNIFICANT GROWTH 12/18/2011   CULT INSIGNIFICANT GROWTH 05/02/2007   No results for input(s): CULT, SDES in the last 168 hours.  Imaging: No results found.   ASSESSMENT:   1. Chronic Systolic Heart Failure on chronic milrinone 0.25 mcg  2. NICM - chemo induced adriamycin 3. 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 Non Hodgkins Lymphoma 5. H/O CVA 2008  6. H/O VT/VF --St Jude ICD 2010  7. DMII  PLAN/DISCUSSION:  Doing well. Continue milrinone 0.25 mcg. Check BMET today   EGD & colon ok. R/L heart cath looked very good. Discussed timing of VAD versus continuing on milrinone for now. He wants to proceed with VAD as soon as  feasible.    Continue heparin/coumadin. Today INR 1.2 Home when INR ~  2.0. Pharmacy dosing coumadin.   [facility] to follow again when discharged.     LOS: 7 days  CLEGG,AMY, NP 09/22/2014, 12:56 PM   Patient seen and examined with Darrick Grinder, NP. We discussed all aspects of the encounter. I agree with the assessment and plan as stated above.   Awaiting therapeutic INR. Otherwise no change. Volume status ok.   For VAD in near future.   Nitish Roes,MD 1:09 PM

## 2014-09-22 NOTE — Progress Notes (Signed)
Follow up with Andre Tran regarding his desire to follow through with VAD instead of continuing on his milrinone. He verbalized a strong desire to proceed with VAD at this point. Discussion regarding what to expect throughout the recovery period, ongoing management and close follow-up in the AHF clinic with VAD team after implant was reinforced. All questions at this time were answered and is ready to start the process.   Dr. Prescott Gum to present at [facility] Thursday to present his case for heart transplant.   All components of his evaluation at this time have been completed and will review with our MRB this evening.

## 2014-09-22 NOTE — Progress Notes (Signed)
Medicare Important Message given? YES  (If response is "NO", the following Medicare IM given date fields will be blank)  Date Medicare IM given: 09/22/14 Medicare IM given by:  Dahlia Client [employer]

## 2014-09-22 NOTE — Progress Notes (Signed)
Utilization review completed.  

## 2014-09-23 ENCOUNTER — Inpatient Hospital Stay (HOSPITAL_COMMUNITY): Payer: Medicare Other

## 2014-09-23 LAB — PULMONARY FUNCTION TEST
DL/VA % pred: 80 %
DL/VA: 3.53 ml/min/mmHg/L
DLCO COR: 16.12 ml/min/mmHg
DLCO cor % pred: 59 %
DLCO unc % pred: 51 %
DLCO unc: 13.89 ml/min/mmHg
FEF 25-75 PRE: 4.21 L/s
FEF 25-75 Post: 4.84 L/sec
FEF2575-%Change-Post: 14 %
FEF2575-%Pred-Post: 166 %
FEF2575-%Pred-Pre: 145 %
FEV1-%Change-Post: 0 %
FEV1-%Pred-Post: 97 %
FEV1-%Pred-Pre: 96 %
FEV1-POST: 2.8 L
FEV1-PRE: 2.77 L
FEV1FVC-%CHANGE-POST: 5 %
FEV1FVC-%Pred-Pre: 105 %
FEV6-%CHANGE-POST: -4 %
FEV6-%PRED-PRE: 94 %
FEV6-%Pred-Post: 90 %
FEV6-POST: 3.13 L
FEV6-Pre: 3.27 L
FEV6FVC-%PRED-POST: 103 %
FEV6FVC-%Pred-Pre: 103 %
FVC-%CHANGE-POST: -4 %
FVC-%Pred-Post: 87 %
FVC-%Pred-Pre: 91 %
FVC-Post: 3.13 L
FVC-Pre: 3.27 L
PRE FEV1/FVC RATIO: 85 %
Post FEV1/FVC ratio: 89 %
Post FEV6/FVC ratio: 100 %
Pre FEV6/FVC Ratio: 100 %
RV % pred: 98 %
RV: 1.83 L
TLC % PRED: 82 %
TLC: 5.1 L

## 2014-09-23 LAB — CBC
HCT: 30.6 % — ABNORMAL LOW (ref 39.0–52.0)
HEMOGLOBIN: 10.5 g/dL — AB (ref 13.0–17.0)
MCH: 29.4 pg (ref 26.0–34.0)
MCHC: 34.3 g/dL (ref 30.0–36.0)
MCV: 85.7 fL (ref 78.0–100.0)
Platelets: 250 10*3/uL (ref 150–400)
RBC: 3.57 MIL/uL — ABNORMAL LOW (ref 4.22–5.81)
RDW: 15.2 % (ref 11.5–15.5)
WBC: 6.5 10*3/uL (ref 4.0–10.5)

## 2014-09-23 LAB — GLUCOSE, CAPILLARY
GLUCOSE-CAPILLARY: 87 mg/dL (ref 70–99)
GLUCOSE-CAPILLARY: 156 mg/dL — AB (ref 70–99)
Glucose-Capillary: 133 mg/dL — ABNORMAL HIGH (ref 70–99)
Glucose-Capillary: 130 mg/dL — ABNORMAL HIGH (ref 70–99)

## 2014-09-23 LAB — PROTIME-INR
INR: 1.51 — ABNORMAL HIGH (ref 0.00–1.49)
Prothrombin Time: 18.4 seconds — ABNORMAL HIGH (ref 11.6–15.2)

## 2014-09-23 LAB — HEPARIN LEVEL (UNFRACTIONATED): Heparin Unfractionated: 0.55 IU/mL (ref 0.30–0.70)

## 2014-09-23 MED ORDER — WARFARIN SODIUM 10 MG PO TABS
10.0000 mg | ORAL_TABLET | Freq: Once | ORAL | Status: AC
  Administered 2014-09-23: 10 mg via ORAL
  Filled 2014-09-23: qty 1

## 2014-09-23 MED ORDER — ALBUTEROL SULFATE (2.5 MG/3ML) 0.083% IN NEBU
2.5000 mg | INHALATION_SOLUTION | Freq: Once | RESPIRATORY_TRACT | Status: DC
Start: 2014-09-23 — End: 2014-09-25

## 2014-09-23 NOTE — Progress Notes (Signed)
ANTICOAGULATION CONSULT NOTE - Follow Up Consult  Pharmacy Consult for heparin and coumadin Indication: hx of CVA and LV thrombus  No Known Allergies  Patient Measurements: Height: 5\' 7"  (170.2 cm) Weight: 163 lb 9.3 oz (74.2 kg) IBW/kg (Calculated) : 66.1 Heparin Dosing Weight:   Vital Signs: Temp: 97 F (36.1 C) (03/15 1329) Temp Source: Oral (03/15 1329) BP: 100/56 mmHg (03/15 1329) Pulse Rate: 89 (03/15 1329)  Labs:  Recent Labs  09/21/14 0535 09/22/14 0546 09/23/14 0355  HGB 11.0* 10.7* 10.5*  HCT 32.0* 30.1* 30.6*  PLT 272 256 250  LABPROT 14.2 15.9* 18.4*  INR 1.09 1.26 1.51*  HEPARINUNFRC 0.47 0.53 0.55    Estimated Creatinine Clearance: 79.3 mL/min (by C-G formula based on Cr of 1.03).   Medications:  Scheduled:  . albuterol  2.5 mg Nebulization Once  . atorvastatin  20 mg Oral q1800  . digoxin  0.125 mg Oral Daily  . glipiZIDE  5 mg Oral QAC breakfast  . insulin aspart  0-15 Units Subcutaneous TID WC  . losartan  50 mg Oral Daily  . magnesium oxide  400 mg Oral QHS  . sodium chloride  3 mL Intravenous Q12H  . spironolactone  25 mg Oral Daily  . Warfarin - Pharmacist Dosing Inpatient   Does not apply q1800   Infusions:  . heparin 800 Units/hr (09/23/14 3016)  . milrinone 0.25 mcg/kg/min (09/23/14 0109)    Assessment: 54 yo male with hx of CVA and LV thrombus resumed on heparin and coumadin s/p cath 3/11.  Patient remains therapeutic HL 0.55 on heparin drip at 800 units/hr.  INR today remains subtherapeutic 1.51.  Coumadin was resumed 3/11 after being on hold for several days due to procedures.  Goal of Therapy:  Heparin level 0.3-0.7 units/ml; INR 2-2.5 (per MD note 3/1) Monitor platelets by anticoagulation protocol: Yes   Plan:  - Continue heparin 800 units/hr. - Coumadin 10 mg x 1 tonight.   - Daily heparin level, CBC and INR   Uvaldo Rising, BCPS  Clinical Pharmacist Pager [phone]  09/23/2014 2:46 PM

## 2014-09-23 NOTE — Progress Notes (Signed)
CARDIAC REHAB PHASE I   PRE:  Rate/Rhythm: 109 ST  BP:  Supine:   Sitting:   Standing:    SaO2:   MODE:  Ambulation: 1650 ft   POST:  Rate/Rhythm: 134 ST  BP:  Supine:   Sitting: 131/60  Standing:    SaO2:  1100-1120 On arrival pt standing at his door. Walked with pt. Gait steady. He was able to walk 1650 feet without c/o of pain or SOB. He states that his hip is not bothering him today. I discussed with him CHF zones, which he is able to repeat. He has CHF packet at home. I did give pt diabetic diet guidelines. Pt's HR after walking was 134 ST.  Rodney Langton RN 09/23/2014 11:41 AM

## 2014-09-23 NOTE — Progress Notes (Addendum)
Subjective:  Denies SOB/Orthopnea. Does not want go home on lovenox. Denies SOB.   EGD, Colonoscopy and R/L heart cath all looked.   INR 1.5  Intake/Output Summary (Last 24 hours) at 09/23/14 1153 Last data filed at 09/23/14 1135  Gross per 24 hour  Intake    720 ml  Output   1200 ml  Net   -480 ml    Current meds: . albuterol  2.5 mg Nebulization Once  . atorvastatin  20 mg Oral q1800  . digoxin  0.125 mg Oral Daily  . glipiZIDE  5 mg Oral QAC breakfast  . insulin aspart  0-15 Units Subcutaneous TID WC  . losartan  50 mg Oral Daily  . magnesium oxide  400 mg Oral QHS  . sodium chloride  3 mL Intravenous Q12H  . spironolactone  25 mg Oral Daily  . Warfarin - Pharmacist Dosing Inpatient   Does not apply q1800   Infusions: . heparin 800 Units/hr (09/23/14 0174)  . milrinone 0.25 mcg/kg/min (09/23/14 9449)     Objective:  Blood pressure 85/50, pulse 104, temperature 98 F (36.7 C), temperature source Oral, resp. rate 18, height 5\' 7"  (1.702 m), weight 163 lb 9.3 oz (74.2 kg), SpO2 100 %. Weight change: -13.1 oz (-0.371 kg)   Physical Exam: General: Well appearing. No resp difficulty. In bed.  HEENT: normal Neck: supple. JVP  flat . Carotids 2+ bilat; no bruits. No lymphadenopathy or thryomegaly appreciated. Cor: PMI nondisplaced. Regular rate & rhythm. No rubs, or murmurs. + S3 Lungs: clear Abdomen: soft, nontender, nondistended. No hepatosplenomegaly. No bruits or masses. Good bowel sounds. Extremities: no cyanosis, clubbing, rash, edema. RUE double lumen PICC R groin site ok Neuro: alert & orientedx3, cranial nerves grossly intact. moves all 4 extremities w/o difficulty. Affect pleasant  Telemetry:SR 80-90s  Lab Results: Basic Metabolic Panel:  Recent Labs Lab 09/17/14 0546 09/17/14 1230 09/18/14 0525  NA 134*  --  132*  K 4.4  --  4.3  CL 100  --  101  CO2 27  --  25  GLUCOSE 76 107* 86  BUN 23  --  14  CREATININE 1.24  --  1.03  CALCIUM 10.0   --  9.0   Liver Function Tests: No results for input(s): AST, ALT, ALKPHOS, BILITOT, PROT, ALBUMIN in the last 168 hours. No results for input(s): LIPASE, AMYLASE in the last 168 hours. No results for input(s): AMMONIA in the last 168 hours. CBC:  Recent Labs Lab 09/19/14 0620 09/20/14 0657 09/21/14 0535 09/22/14 0546 09/23/14 0355  WBC 6.2 6.0 7.3 8.3 6.5  HGB 10.5* 10.4* 11.0* 10.7* 10.5*  HCT 30.7* 30.3* 32.0* 30.1* 30.6*  MCV 84.8 84.9 85.6 84.6 85.7  PLT 254 243 272 256 250   Cardiac Enzymes: No results for input(s): CKTOTAL, CKMB, CKMBINDEX, TROPONINI in the last 168 hours. BNP: Invalid input(s): POCBNP CBG:  Recent Labs Lab 09/22/14 0620 09/22/14 1116 09/22/14 1718 09/22/14 2134 09/23/14 0615  GLUCAP 113* 134* 169* 125* 133*   Microbiology: Lab Results  Component Value Date   CULT  06/21/2014    NO GROWTH 5 DAYS Performed at [facility]  06/21/2014    NO GROWTH 5 DAYS Performed at [facility] No Beta Hemolytic Streptococci Isolated 01/06/2013   CULT INSIGNIFICANT GROWTH 12/18/2011   CULT INSIGNIFICANT GROWTH 05/02/2007   No results for input(s): CULT, SDES in the last 168 hours.  Imaging: No results  found.   ASSESSMENT:   1. Chronic Systolic Heart Failure on chronic milrinone 0.25 mcg  2. NICM - chemo induced adriamycin 3. 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 Non Hodgkins Lymphoma 5. H/O CVA 2008  6. H/O VT/VF --St Jude ICD 2010  7. DMII  PLAN/DISCUSSION:  Continue milrinone 0.25 mcg. Does not want to go home on lovenox. INR 1.5.   Continue heparin/coumadin. Today INR 1.5. .  Pharmacy dosing coumadin.   EGD & colon ok. R/L heart cath looked very good. Discussed timing of VAD versus continuing on milrinone for now. He wants to proceed with VAD as soon as feasible.   [facility] to follow again when discharged.     LOS: 8 days  CLEGG,AMY, NP 09/23/2014, 11:53 AM   Patient seen and examined with Darrick Grinder, NP. We discussed all aspects of the encounter. I agree with the assessment and plan as stated above.   Doing well. Home when INR > = 1.8 . Not interested in home lovenox.   Daniel Bensimhon,MD 6:05 PM

## 2014-09-24 LAB — CBC
HCT: 30.2 % — ABNORMAL LOW (ref 39.0–52.0)
Hemoglobin: 10.3 g/dL — ABNORMAL LOW (ref 13.0–17.0)
MCH: 29.2 pg (ref 26.0–34.0)
MCHC: 34.1 g/dL (ref 30.0–36.0)
MCV: 85.6 fL (ref 78.0–100.0)
PLATELETS: 255 10*3/uL (ref 150–400)
RBC: 3.53 MIL/uL — AB (ref 4.22–5.81)
RDW: 15.4 % (ref 11.5–15.5)
WBC: 6.2 10*3/uL (ref 4.0–10.5)

## 2014-09-24 LAB — GLUCOSE, CAPILLARY
GLUCOSE-CAPILLARY: 208 mg/dL — AB (ref 70–99)
GLUCOSE-CAPILLARY: 135 mg/dL — AB (ref 70–99)
Glucose-Capillary: 133 mg/dL — ABNORMAL HIGH (ref 70–99)

## 2014-09-24 LAB — HEPARIN LEVEL (UNFRACTIONATED): HEPARIN UNFRACTIONATED: 0.5 [IU]/mL (ref 0.30–0.70)

## 2014-09-24 LAB — PROTIME-INR
INR: 1.55 — ABNORMAL HIGH (ref 0.00–1.49)
Prothrombin Time: 18.7 seconds — ABNORMAL HIGH (ref 11.6–15.2)

## 2014-09-24 MED ORDER — WARFARIN SODIUM 10 MG PO TABS
12.5000 mg | ORAL_TABLET | Freq: Once | ORAL | Status: AC
  Administered 2014-09-24: 12.5 mg via ORAL
  Filled 2014-09-24 (×2): qty 1

## 2014-09-24 MED ORDER — WARFARIN SODIUM 10 MG PO TABS
10.0000 mg | ORAL_TABLET | Freq: Once | ORAL | Status: DC
  Filled 2014-09-24: qty 1

## 2014-09-24 NOTE — Progress Notes (Signed)
Subjective:   Denies SOB/Orthopnea. Eager to go home but does not want go home on lovenox. INR 1.55. Weight stable   EGD, Colonoscopy and R/L heart cath all looked.  PFTs ok with slight reduction in DLCO.   Intake/Output Summary (Last 24 hours) at 09/24/14 0317 Last data filed at 09/23/14 1829  Gross per 24 hour  Intake    720 ml  Output   1350 ml  Net   -630 ml    Current meds: . albuterol  2.5 mg Nebulization Once  . atorvastatin  20 mg Oral q1800  . digoxin  0.125 mg Oral Daily  . glipiZIDE  5 mg Oral QAC breakfast  . insulin aspart  0-15 Units Subcutaneous TID WC  . losartan  50 mg Oral Daily  . magnesium oxide  400 mg Oral QHS  . sodium chloride  3 mL Intravenous Q12H  . spironolactone  25 mg Oral Daily  . Warfarin - Pharmacist Dosing Inpatient   Does not apply q1800   Infusions: . heparin 800 Units/hr (09/23/14 9390)  . milrinone 0.25 mcg/kg/min (09/23/14 2122)     Objective:  Blood pressure 95/55, pulse 86, temperature 98.4 F (36.9 C), temperature source Oral, resp. rate 18, height 5\' 7"  (1.702 m), weight 74.2 kg (163 lb 9.3 oz), SpO2 100 %. Weight change:    Physical Exam: General: Well appearing. No resp difficulty. Sitting up in bed  HEENT: normal Neck: supple. JVP 6-7 . Carotids 2+ bilat; no bruits. No lymphadenopathy or thryomegaly appreciated. Cor: PMI nondisplaced. Regular rate & rhythm. No rubs, or murmurs. + S3  Lungs: clear Abdomen: soft, nontender, nondistended. No hepatosplenomegaly. No bruits or masses. Good bowel sounds. Extremities: no cyanosis, clubbing, rash, edema. RUE double lumen PICC R  Neuro: alert & orientedx3, cranial nerves grossly intact. moves all 4 extremities w/o difficulty. Affect pleasant  Telemetry:SR 80-90s  Lab Results: Basic Metabolic Panel:  Recent Labs Lab 09/17/14 0546 09/17/14 1230 09/18/14 0525  NA 134*  --  132*  K 4.4  --  4.3  CL 100  --  101  CO2 27  --  25  GLUCOSE 76 107* 86  BUN 23  --  14    CREATININE 1.24  --  1.03  CALCIUM 10.0  --  9.0   Liver Function Tests: No results for input(s): AST, ALT, ALKPHOS, BILITOT, PROT, ALBUMIN in the last 168 hours. No results for input(s): LIPASE, AMYLASE in the last 168 hours. No results for input(s): AMMONIA in the last 168 hours. CBC:  Recent Labs Lab 09/19/14 0620 09/20/14 0657 09/21/14 0535 09/22/14 0546 09/23/14 0355  WBC 6.2 6.0 7.3 8.3 6.5  HGB 10.5* 10.4* 11.0* 10.7* 10.5*  HCT 30.7* 30.3* 32.0* 30.1* 30.6*  MCV 84.8 84.9 85.6 84.6 85.7  PLT 254 243 272 256 250   Cardiac Enzymes: No results for input(s): CKTOTAL, CKMB, CKMBINDEX, TROPONINI in the last 168 hours. BNP: Invalid input(s): POCBNP CBG:  Recent Labs Lab 09/22/14 2134 09/23/14 0615 09/23/14 1135 09/23/14 1627 09/23/14 2100  GLUCAP 125* 133* 87 156* 130*   Microbiology: Lab Results  Component Value Date   CULT  06/21/2014    NO GROWTH 5 DAYS Performed at [facility]  06/21/2014    NO GROWTH 5 DAYS Performed at [facility] No Beta Hemolytic Streptococci Isolated 01/06/2013   CULT INSIGNIFICANT GROWTH 12/18/2011   CULT INSIGNIFICANT GROWTH 05/02/2007   No results for input(s):  CULT, SDES in the last 168 hours.  Imaging: No results found.   ASSESSMENT:   1. Chronic Systolic Heart Failure on chronic milrinone 0.25 mcg  2. NICM - chemo induced adriamycin 3. 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 Non Hodgkins Lymphoma 5. H/O CVA 2008  6. H/O VT/VF --St Jude ICD 2010  7. DMII  PLAN/DISCUSSION:  Doing well. Home when INR > = 1.8 . Not interested in home lovenox. Continue milrinone 0.25 mcg. VAD placement in early April. PFTs ok. Will continue VAD training today.   [facility] to follow again when discharged.   Malaka Ruffner,MD 3:17 AM

## 2014-09-24 NOTE — Progress Notes (Signed)
CARDIAC REHAB PHASE I   PRE:  Rate/Rhythm: 95 SR  BP:  Supine:   Sitting: 74/44  Standing:    SaO2: 97 RA  MODE:  Ambulation: 2670 ft   POST:  Rate/Rhythm: 113 ST  BP:  Supine:   Sitting: 86/57  Standing:    SaO2: 99 RA 1345-1425 Pt tolerated ambulation well without c/o. BP lower today, but pt without c/o. HR after walk today 113 ST. Pt back to recliner after walk with call light in reach.  Rodney Langton RN 09/24/2014 2:23 PM

## 2014-09-24 NOTE — Progress Notes (Signed)
NUTRITION FOLLOW-UP  INTERVENTION: -Continue to monitor  NUTRITION DIAGNOSIS: No nutrition diagnosis at this time.  Goal: -Pt to meet >/= 90% of estimated needs; met.   Monitor:  -PO intake, weight trends, labs  ASSESSMENT: Pt admitted for LVAD evaluation.  PMH significant for HTN, CVA, CHF, DM, Depression.  S/p St. Jude ICD placement.  Stroke in 2008.    Appetite very good, consumes 100% of his meals. Pt will d/c when INR is 1.8 per notes. Pt feels he will have no problems with eating or getting healthy meals at d/c.   Height: Ht Readings from Last 1 Encounters:  09/15/14 $RemoveB'5\' 7"'BkOIlSml$  (1.702 m)    Weight: Wt Readings from Last 1 Encounters:  09/24/14 160 lb 11.5 oz (72.9 kg)    BMI:  Body mass index is 25.17 kg/(m^2).  Estimated Nutritional Needs: Kcal: 1900-2100 kcal Protein: 90-105 g protein Fluid: >/= 1.9 L  Skin: Incision right leg  Diet Order: Diet heart healthy/carb modified   Intake/Output Summary (Last 24 hours) at 09/24/14 1447 Last data filed at 09/24/14 0703  Gross per 24 hour  Intake    480 ml  Output   2150 ml  Net  -1670 ml    Last BM: 3/12  Labs:   Recent Labs Lab 09/18/14 0525  NA 132*  K 4.3  CL 101  CO2 25  BUN 14  CREATININE 1.03  CALCIUM 9.0  GLUCOSE 86    CBG (last 3)   Recent Labs  09/23/14 1627 09/23/14 2100 09/24/14 0641  GLUCAP 156* 130* 133*    Scheduled Meds: . albuterol  2.5 mg Nebulization Once  . atorvastatin  20 mg Oral q1800  . digoxin  0.125 mg Oral Daily  . glipiZIDE  5 mg Oral QAC breakfast  . insulin aspart  0-15 Units Subcutaneous TID WC  . losartan  50 mg Oral Daily  . magnesium oxide  400 mg Oral QHS  . sodium chloride  3 mL Intravenous Q12H  . spironolactone  25 mg Oral Daily  . warfarin  10 mg Oral ONCE-1800  . Warfarin - Pharmacist Dosing Inpatient   Does not apply q1800    Continuous Infusions: . heparin 800 Units/hr (09/23/14 0639)  . milrinone 0.25 mcg/kg/min (09/23/14 2122)   [place], [place], [place] Pager [phone] After Hours Pager

## 2014-09-24 NOTE — Progress Notes (Addendum)
ANTICOAGULATION CONSULT NOTE - Follow Up Consult  Pharmacy Consult for heparin and coumadin Indication: hx of CVA and LV thrombus  No Known Allergies  Patient Measurements: Height: 5\' 7"  (170.2 cm) Weight: 160 lb 11.5 oz (72.9 kg) IBW/kg (Calculated) : 66.1  Vital Signs: Temp: 98.4 F (36.9 C) (03/16 0500) Temp Source: Oral (03/16 0500) BP: 102/65 mmHg (03/16 0500) Pulse Rate: 80 (03/16 0500)  Labs:  Recent Labs  09/22/14 0546 09/23/14 0355 09/24/14 0500 09/24/14 0530  HGB 10.7* 10.5*  --  10.3*  HCT 30.1* 30.6*  --  30.2*  PLT 256 250  --  255  LABPROT 15.9* 18.4* 18.7*  --   INR 1.26 1.51* 1.55*  --   HEPARINUNFRC 0.53 0.55 0.50  --     Estimated Creatinine Clearance: 79.3 mL/min (by C-G formula based on Cr of 1.03).   Medications:  Scheduled:  . albuterol  2.5 mg Nebulization Once  . atorvastatin  20 mg Oral q1800  . digoxin  0.125 mg Oral Daily  . glipiZIDE  5 mg Oral QAC breakfast  . insulin aspart  0-15 Units Subcutaneous TID WC  . losartan  50 mg Oral Daily  . magnesium oxide  400 mg Oral QHS  . sodium chloride  3 mL Intravenous Q12H  . spironolactone  25 mg Oral Daily  . Warfarin - Pharmacist Dosing Inpatient   Does not apply q1800   Infusions:  . heparin 800 Units/hr (09/23/14 4801)  . milrinone 0.25 mcg/kg/min (09/23/14 2122)    Assessment: 53yo male with hx of CVA and LV thrombus resumed on heparin and coumadin s/p cath 3/11.  Patient remains therapeutic HL 0.5 on heparin drip at 800 units/hr.  INR today remains subtherapeutic at 1.55, only slightly up from yesterday.  Coumadin was resumed 3/11 after being on hold for several days due to procedures. H&H slowly trending down, PLT WNL, no bleeding noted.  PTA dose 5mg  TThSat, 7.5mg  AOD *INR 4.4 on 09/09/14 on this dose (outpatinet anticoag visit)  Goal of Therapy:  Heparin level 0.3-0.7 units/ml; INR 2-2.5 (per MD note 3/1) Monitor platelets by anticoagulation protocol: Yes   Plan:  - Continue  heparin 800 units/hr - Coumadin 10mg  x 1 tonight - Daily heparin level, INR, CBC, s/sx of bleeding  Drucie Opitz, PharmD Clinical Pharmacy Resident Pager: [phone] 09/24/2014 7:22 AM   Follow up on PM rounds with Dr. Haroldine Laws.  INR lagging, unclear etiology.  Possibly improved perfusion.  Will increase PM dose to 12.5 mg follow up am INR. Follow up in AM re home dose

## 2014-09-25 ENCOUNTER — Encounter: Payer: Self-pay | Admitting: Internal Medicine

## 2014-09-25 LAB — CBC
HEMATOCRIT: 30.7 % — AB (ref 39.0–52.0)
Hemoglobin: 10.5 g/dL — ABNORMAL LOW (ref 13.0–17.0)
MCH: 29.2 pg (ref 26.0–34.0)
MCHC: 34.2 g/dL (ref 30.0–36.0)
MCV: 85.5 fL (ref 78.0–100.0)
Platelets: 266 10*3/uL (ref 150–400)
RBC: 3.59 MIL/uL — ABNORMAL LOW (ref 4.22–5.81)
RDW: 15.5 % (ref 11.5–15.5)
WBC: 6.5 10*3/uL (ref 4.0–10.5)

## 2014-09-25 LAB — GLUCOSE, CAPILLARY
GLUCOSE-CAPILLARY: 143 mg/dL — AB (ref 70–99)
Glucose-Capillary: 85 mg/dL (ref 70–99)

## 2014-09-25 LAB — PROTIME-INR
INR: 1.93 — ABNORMAL HIGH (ref 0.00–1.49)
Prothrombin Time: 22.2 seconds — ABNORMAL HIGH (ref 11.6–15.2)

## 2014-09-25 LAB — HEPARIN LEVEL (UNFRACTIONATED): Heparin Unfractionated: 0.44 IU/mL (ref 0.30–0.70)

## 2014-09-25 MED ORDER — FUROSEMIDE 40 MG PO TABS: 40.0000 mg | ORAL_TABLET | ORAL | Status: DC | PRN

## 2014-09-25 MED ORDER — WARFARIN SODIUM 7.5 MG PO TABS
7.5000 mg | ORAL_TABLET | Freq: Once | ORAL | Status: DC
  Filled 2014-09-25: qty 1

## 2014-09-25 MED ORDER — INSULIN GLARGINE 100 UNIT/ML ~~LOC~~ SOLN: 5.0000 [IU] | Freq: Every day | SUBCUTANEOUS | Status: DC

## 2014-09-25 MED ORDER — WARFARIN SODIUM 5 MG PO TABS: ORAL_TABLET | ORAL | Status: DC

## 2014-09-25 NOTE — Progress Notes (Signed)
Subjective:   Denies SOB/Orthopnea. Wants to go home. Eager to go home but does not want go home on lovenox. INR 1.93. Weight stable   EGD, Colonoscopy and R/L heart cath all looked.  PFTs ok with slight reduction in DLCO.   Intake/Output Summary (Last 24 hours) at 09/25/14 0827 Last data filed at 09/25/14 0434  Gross per 24 hour  Intake    360 ml  Output   1550 ml  Net  -1190 ml    Current meds: . albuterol  2.5 mg Nebulization Once  . atorvastatin  20 mg Oral q1800  . digoxin  0.125 mg Oral Daily  . glipiZIDE  5 mg Oral QAC breakfast  . insulin aspart  0-15 Units Subcutaneous TID WC  . losartan  50 mg Oral Daily  . magnesium oxide  400 mg Oral QHS  . sodium chloride  3 mL Intravenous Q12H  . spironolactone  25 mg Oral Daily  . warfarin  7.5 mg Oral ONCE-1800  . Warfarin - Pharmacist Dosing Inpatient   Does not apply q1800   Infusions: . heparin 800 Units/hr (09/23/14 6073)  . milrinone 0.25 mcg/kg/min (09/24/14 1705)     Objective:  Blood pressure 104/61, pulse 89, temperature 98.2 F (36.8 C), temperature source Oral, resp. rate 20, height 5\' 7"  (1.702 m), weight 162 lb (73.483 kg), SpO2 97 %. Weight change: 1 lb 4.6 oz (0.583 kg)   Physical Exam: General: Well appearing. No resp difficulty. Sitting up in bed  HEENT: normal Neck: supple. JVP 6-7 . Carotids 2+ bilat; no bruits. No lymphadenopathy or thryomegaly appreciated. Cor: PMI nondisplaced. Regular rate & rhythm. No rubs, or murmurs. + S3  Lungs: clear Abdomen: soft, nontender, nondistended. No hepatosplenomegaly. No bruits or masses. Good bowel sounds. Extremities: no cyanosis, clubbing, rash, edema. RUE double lumen PICC R  Neuro: alert & orientedx3, cranial nerves grossly intact. moves all 4 extremities w/o difficulty. Affect pleasant  Telemetry:SR 80-90s  Lab Results: Basic Metabolic Panel: No results for input(s): NA, K, CL, CO2, GLUCOSE, BUN, CREATININE, CALCIUM, MG, PHOS in the last 168  hours. Liver Function Tests: No results for input(s): AST, ALT, ALKPHOS, BILITOT, PROT, ALBUMIN in the last 168 hours. No results for input(s): LIPASE, AMYLASE in the last 168 hours. No results for input(s): AMMONIA in the last 168 hours. CBC:  Recent Labs Lab 09/21/14 0535 09/22/14 0546 09/23/14 0355 09/24/14 0530 09/25/14 0615  WBC 7.3 8.3 6.5 6.2 6.5  HGB 11.0* 10.7* 10.5* 10.3* 10.5*  HCT 32.0* 30.1* 30.6* 30.2* 30.7*  MCV 85.6 84.6 85.7 85.6 85.5  PLT 272 256 250 255 266   Cardiac Enzymes: No results for input(s): CKTOTAL, CKMB, CKMBINDEX, TROPONINI in the last 168 hours. BNP: Invalid input(s): POCBNP CBG:  Recent Labs Lab 09/23/14 2100 09/24/14 0641 09/24/14 1723 09/24/14 2127 09/25/14 0646  GLUCAP 130* 133* 208* 135* 143*   Microbiology: Lab Results  Component Value Date   CULT  06/21/2014    NO GROWTH 5 DAYS Performed at [facility]  06/21/2014    NO GROWTH 5 DAYS Performed at [facility] No Beta Hemolytic Streptococci Isolated 01/06/2013   CULT INSIGNIFICANT GROWTH 12/18/2011   CULT INSIGNIFICANT GROWTH 05/02/2007   No results for input(s): CULT, SDES in the last 168 hours.  Imaging: No results found.   ASSESSMENT:   1. Chronic Systolic Heart Failure on chronic milrinone 0.25 mcg  2. NICM - chemo induced adriamycin  3. 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 Non Hodgkins Lymphoma 5. H/O CVA 2008  6. H/O VT/VF --St Jude ICD 2010  7. DMII  PLAN/DISCUSSION:  Doing well. Home when INR > = 1.9 . Home today on 7.5 mg coumadin daily. Continue milrinone 0.25 mcg. VAD placement in early April. PFTs ok.   [facility] to follow again when discharged. Will need INR and BMET on Monday.   CLEGG,AMY,NP-C   8:27 AM   Patient seen and examined with Darrick Grinder, NP. We discussed all aspects of the encounter. I agree with the assessment and plan as stated above.   INR 1.93. Ok to go home today. Plan VAD in April. Teaching  will be done before discharge.   Brevyn Ring,MD 9:51 AM

## 2014-09-25 NOTE — Progress Notes (Signed)
Pt and wife given d/c instructions; tele monitor d/c at this time; both pt and wife verbalized understanding of d/c instructions; will cont. To monitor.

## 2014-09-25 NOTE — Progress Notes (Signed)
ANTICOAGULATION CONSULT NOTE - Follow Up Consult  Pharmacy Consult for heparin and coumadin Indication: hx of CVA and LV thrombus  No Known Allergies  Patient Measurements: Height: 5\' 7"  (170.2 cm) Weight: 162 lb (73.483 kg) IBW/kg (Calculated) : 66.1  Vital Signs: Temp: 98.2 F (36.8 C) (03/17 0430) Temp Source: Oral (03/17 0430) BP: 104/61 mmHg (03/17 0430) Pulse Rate: 89 (03/17 0430)  Labs:  Recent Labs  09/23/14 0355 09/24/14 0500 09/24/14 0530 09/25/14 0615  HGB 10.5*  --  10.3* 10.5*  HCT 30.6*  --  30.2* 30.7*  PLT 250  --  255 266  LABPROT 18.4* 18.7*  --  22.2*  INR 1.51* 1.55*  --  1.93*  HEPARINUNFRC 0.55 0.50  --  0.44    Estimated Creatinine Clearance: 79.3 mL/min (by C-G formula based on Cr of 1.03).   Medications:  Scheduled:  . albuterol  2.5 mg Nebulization Once  . atorvastatin  20 mg Oral q1800  . digoxin  0.125 mg Oral Daily  . glipiZIDE  5 mg Oral QAC breakfast  . insulin aspart  0-15 Units Subcutaneous TID WC  . losartan  50 mg Oral Daily  . magnesium oxide  400 mg Oral QHS  . sodium chloride  3 mL Intravenous Q12H  . spironolactone  25 mg Oral Daily  . Warfarin - Pharmacist Dosing Inpatient   Does not apply q1800   Infusions:  . heparin 800 Units/hr (09/23/14 7048)  . milrinone 0.25 mcg/kg/min (09/24/14 1705)    Assessment: 54yo male with hx of CVA and LV thrombus resumed on heparin and coumadin s/p cath 3/11.  Coumadin was resumed 3/11 after being on hold for several days due to procedures.  Patient remains therapeutic HL 0.44 on heparin drip at 800 units/hr.  INR today slightly subtherapeutic at 1.93, but bumped up from 1.55 yesterday.  H&H slowly trending down, PLT WNL, no bleeding noted.  PTA dose 5mg  TThSat, 7.5mg  AOD *INR 4.4 on 09/09/14 on this dose (outpatinet anticoag visit)  Would recommend 7.5mg  daily w/ F/U in Coumadin Clinic on Monday, 3/21  Goal of Therapy:  Heparin level 0.3-0.7 units/ml; INR 2-2.5 (per MD note  3/1) Monitor platelets by anticoagulation protocol: Yes   Plan:  - Continue heparin 800 units/hr - Coumadin 7.5mg  x 1 tonight - Discontinue heparin when INR > 2 (if pt remains inpatient beyond today) - Daily heparin level, INR, CBC, s/sx of bleeding  Drucie Opitz, PharmD Clinical Pharmacy Resident Pager: [phone] 09/25/2014 7:46 AM

## 2014-09-25 NOTE — Progress Notes (Signed)
Pt up ambulating in hallway with wife; pt awaiting HH to see him prior to d/c to hook pt up to home Milrinone; will cont. To monitor.

## 2014-09-26 NOTE — Progress Notes (Signed)
CT surgery- VAD team note  [name] was presented to the [facility] transplant/ mechanical cardiac support team March 17 at the weekly [facility]iac transplant listing conference in [place].The  purpose of the presentation was to determine candidacy for Destination Therapy implantable VAD therapy for [name].  Those present included Dr Charlynn Grimes, Cardiology , Director of Cardiac Transplantation at [facility].   [name]'s medical history and the results of his comprehensive evaluation by the VAD service at [facility] were reviewed.   The recommendation of the [facility] panel was to proceed with DT VAD implant . [name] was not felt to be an appropriate candidate for cardiac transplantation because of history of two prior lymphoma- malignancies- the last being treated 8 years ago.

## 2014-09-26 NOTE — Discharge Summary (Signed)
Advanced Heart Failure Team  Discharge Summary   Patient ID: Andre Tran MRN: [id number], DOB/AGE: [birth year] 52 y.o. Admit date: 09/15/2014 D/C date:  09/25/2014   Primary Discharge Diagnoses:  1. Chronic Systolic Heart Failure on chronic milrinone 0.25 mcg  2. NICM - chemo induced adriamycin 3. 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 Non Hodgkins Lymphoma 5. H/O CVA 2008  6. H/O VT/VF --St Jude ICD 2010  7. DMII  Hospital Course:  Andre Tran is a 54 y.o. 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 He is also has NYHA IVD functional class with home milrinone at 0.25 mcg. Currently getting work up for LVAD.  He was admitted to start heparin and hold coumadin in preparation for EGD/colonscopy as part of LVAD work up. GI consulted and once INR drifted down he had endoscopy and colonoscopy. EGD showed hiatal hernial and colonoscopy was normal. He also had RHC/LHC as noted below with well compensated hemodynamics and minimal atherosclerosis. Palliative Care consulted as part of LVAD work up.   He remained on heparin + coumadin until INR was therapeutic. From HF persepctive he remained stable and will continue home milrinone at 0.25 mcg. AHC will follow for weekly lab work.  Next week he will return to HF clinic to discuss potential LVAD date.     RHC/LHC 09/19/2014   RA = 1 RV = 26/0/1 PA = 36/23 (29) PCW = 3 Fick cardiac output/index = 6.4/3.5 PVR = 4.1 WU FA sat = 96% PA sat = 68%, 69% Ao Pressure: 105/68 (82) LV Pressure: 107/4/14  Minimal coronary atherosclerosis.   PFTS- ok with slight reduction in DLCO.  Discharge Weight Range: 162 pounds.  Discharge Vitals:  Blood pressure 104/61, pulse 89, temperature 98.2 F (36.8 C), temperature source Oral, resp. rate 20, height 5\' 7"  (1.702 m), weight 162 lb (73.483 kg), SpO2 97 %.  Labs: Lab Results  Component Value Date   WBC 6.5 09/25/2014   HGB 10.5* 09/25/2014   HCT 30.7* 09/25/2014   MCV 85.5 09/25/2014   PLT 266 09/25/2014   No results for input(s): NA, K, CL, CO2, BUN, CREATININE, CALCIUM, PROT, BILITOT, ALKPHOS, ALT, AST, GLUCOSE in the last 168 hours.  Invalid input(s): LABALBU Lab Results  Component Value Date   CHOL 106 06/21/2014   HDL 36* 06/21/2014   LDLCALC 58 06/21/2014   TRIG 61 06/21/2014   BNP (last 3 results)  Recent Labs  09/15/14 1535  BNP 100.8*    ProBNP (last 3 results)  Recent Labs  06/12/14 1415 06/18/14 1200  PROBNP 2079.0* 2144.0*     Diagnostic Studies/Procedures   No results found.  Discharge Medications     Medication List    STOP taking these medications        isosorbide dinitrate 5 MG tablet  Commonly known as:  ISORDIL      TAKE these medications        albuterol 108 (90 BASE) MCG/ACT inhaler  Commonly known as:  PROVENTIL HFA;VENTOLIN HFA  Inhale 1-2 puffs into the lungs every 6 (six) hours as needed for wheezing or shortness of breath.     atorvastatin 40 MG tablet  Commonly known as:  LIPITOR  Take 0.5 tablets (20 mg total) by mouth at bedtime.     bisacodyl 5 MG EC tablet  Commonly known as:  DULCOLAX  Take 1 tablet (5 mg total) by mouth daily as needed for moderate constipation.     digoxin 0.125 MG tablet  Commonly known as:  LANOXIN  Take 0.125 mg by mouth daily.     docusate sodium 100 MG capsule  Commonly known as:  COLACE  Take 1 capsule (100 mg total) by mouth 2 (two) times daily.     furosemide 40 MG tablet  Commonly known as:  LASIX  Take 1 tablet (40 mg total) by mouth as needed.     glipiZIDE 5 MG tablet  Commonly known as:  GLUCOTROL  Take 5 mg by mouth daily before breakfast.      HYDROcodone-acetaminophen 5-325 MG per tablet  Commonly known as:  NORCO/VICODIN  Take 1-2 tablets by mouth every 6 (six) hours as needed for moderate pain.     insulin glargine 100 UNIT/ML injection  Commonly known as:  LANTUS  Inject 0.05 mLs (5 Units total) into the skin at bedtime.     losartan 100 MG tablet  Commonly known as:  COZAAR  Take 50 mg by mouth at bedtime.     Magnesium 400 MG Tabs  Take 1 tablet by mouth daily with breakfast.     milrinone 20 MG/100ML Soln infusion  Commonly known as:  PRIMACOR  Inject 19.5 mcg/min into the vein continuous.     spironolactone 25 MG tablet  Commonly known as:  ALDACTONE  Take 1 tablet (25 mg total) by mouth daily.     warfarin 5 MG tablet  Commonly known as:  COUMADIN  Take 1 1/2 tablets daily.        Disposition   The patient will be discharged in stable condition to home.     Discharge Instructions    ACE Inhibitor / ARB already ordered    Complete by:  As directed      Diet - low sodium heart healthy    Complete by:  As directed      Heart Failure patients record your daily weight using the same scale at the same time of day    Complete by:  As directed      Increase activity slowly    Complete by:  As directed           Follow-up Information    Follow up with [facility].   Why:  HH-RN for IV Milrinone   Contact information:   [address] [place] [address] [phone]       Follow up with Glori Bickers, MD On 09/29/2014.   Specialty:  Cardiology   Why:  at 0920 am in the [facility] --gate code 7000-- bring all medications to appt.   Contact information:   [address] [place] [address] [phone]         Duration of Discharge Encounter: Greater than 35 minutes   Signed, CLEGG,AMY  09/26/2014, 9:29 AM   Patient seen and examined with Darrick Grinder, NP. We discussed all aspects of the encounter. I agree with the  assessment and plan as stated above. He is ready for d/c. Will readmit for VAD placement in several weeks. Continue milrinone.   Carlon Chaloux,MD 1:17 PM

## 2014-09-29 ENCOUNTER — Ambulatory Visit (HOSPITAL_COMMUNITY)
Admit: 2014-09-29 | Discharge: 2014-09-29 | Disposition: A | Discharge status: Home / Self Care | Payer: Medicare Other | Source: Ambulatory Visit | Attending: Internal Medicine | Admitting: Internal Medicine

## 2014-09-29 ENCOUNTER — Ambulatory Visit (HOSPITAL_COMMUNITY): Payer: Self-pay | Admitting: Infectious Diseases

## 2014-09-29 ENCOUNTER — Encounter: Payer: Self-pay | Admitting: Internal Medicine

## 2014-09-29 VITALS — BP 88/42 | HR 112 | Wt 165.4 lb

## 2014-09-29 DIAGNOSIS — R0600 Dyspnea, unspecified: Secondary | ICD-10-CM | POA: Diagnosis not present

## 2014-09-29 DIAGNOSIS — I5022 Chronic systolic (congestive) heart failure: Secondary | ICD-10-CM | POA: Diagnosis not present

## 2014-09-29 DIAGNOSIS — I5032 Chronic diastolic (congestive) heart failure: Secondary | ICD-10-CM | POA: Insufficient documentation

## 2014-09-29 LAB — PROTIME-INR
INR: 2.35 — ABNORMAL HIGH (ref 0.00–1.49)
Prothrombin Time: 25.9 seconds — ABNORMAL HIGH (ref 11.6–15.2)

## 2014-09-29 LAB — BRAIN NATRIURETIC PEPTIDE: B NATRIURETIC PEPTIDE 5: 115.3 pg/mL — AB (ref 0.0–100.0)

## 2014-09-29 LAB — BASIC METABOLIC PANEL
Anion gap: 10 (ref 5–15)
BUN: 44 mg/dL — ABNORMAL HIGH (ref 6–23)
CO2: 25 mmol/L (ref 19–32)
Calcium: 9.8 mg/dL (ref 8.4–10.5)
Chloride: 98 mmol/L (ref 96–112)
Creatinine, Ser: 1.72 mg/dL — ABNORMAL HIGH (ref 0.50–1.35)
GFR calc Af Amer: 51 mL/min — ABNORMAL LOW (ref >90)
GFR calc non Af Amer: 44 mL/min — ABNORMAL LOW (ref >90)
Glucose, Bld: 109 mg/dL — ABNORMAL HIGH (ref 70–99)
POTASSIUM: 4.8 mmol/L (ref 3.5–5.1)
SODIUM: 133 mmol/L — AB (ref 135–145)

## 2014-09-29 LAB — MAGNESIUM: Magnesium: 2.2 mg/dL (ref 1.5–2.5)

## 2014-09-29 NOTE — Progress Notes (Signed)
    VAD Teaching Note:   After Mr. Orchard's clinic visit today I provided both Mr. Fontenot and his wife (primary caregiver) VAD teaching and education again since his surgery is scheduled for 10/13/14. They verbalize the understanding of the HM II system operation and indication for his VAD. Provided discussion regarding what to expect on the operative day and the subsequent recovery in the ICU and step down departments afterward.   Discussed that our average length of stay after implant currently is between 18 - 21 days. Reinforced that he will need to work hard with the medical team to ambulate early on and progress his mobility.   They are asking a lot of questions regarding traveling with his VAD and have informed me of trips they have already scheduled. Reinforced to them that this surgery will take some time to recover from and we will discuss the travel procedure at a later time once they feel comfortable with the basics of the equipment and living on device.   All questions answered at this time relating to the upcoming surgery and living on device; they both had good interaction with me throughout the conversation and seem excited to have the surgery. Mr. Gauger was able to change power sources and identify the pieces of equipment correctly (PM, Battery charger, battery clips, patient controller, etc.).   Reinforced that the back up bag is a MUST at anytime they are out of the house, of which they did not quite understand at first. Will need to continue to reinforce this point throughout his post-operative period.    Total time: 60 min    Ishaaq Penna, Gareth Morgan, RN

## 2014-09-29 NOTE — Progress Notes (Signed)
Patient ID: Andre Tran, male   DOB: Oct 10, 1960, 54 y.o.   MRN: [id number]  Physicians EP : Dr Lovena Le VA: Dr Jenny Reichmann Oncologist: Dr Alen Blew  PCP: Dr. Maudie Mercury ([facility])  HPI: 54 y.o. 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  He has h/o recurrent lymphoma (1993, 2007) treated with chemo (including adriamycin) - details unclear.  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   Evaluated in [facility] ED 11/02/12 due to dyspnea and later discharged. He had been out of lasix for 1 month. He was restarted on lasix 40 mg daily. CEs negative. Pro BNP 1633.   Admitted 37/1-69/67/89 with A/C systolic HF and cardiogenic shock. Initial co-ox 42%. He was started on Milrinone and PICC placed for home milrinone. Started VAD work up.   Admitted 09/15/2014 thorugh 09/25/2014 for heparin bridging for LVAD work up. Heparin was started for egd and colonoscopy. EGD showed hiatal hernial and colonoscopy was normal. Had RHC/.LHC. Normal coronaries. Well compensated hemodynamics on milrinone. PFTs ok. Discharge weight was 162 pounds.   He returns for post hospital follow up. He remains on milrinone 0.25 mcg/kg/min. Overall feeling good. Denies SOB/PND/Orthopnea.Taking all medications.  Followed by [facility] for weekly BMET.   RHC/LHC 09/19/2014  RA = 1 RV = 26/0/1 PA = 36/23 (29) PCW = 3 Fick cardiac output/index = 6.4/3.5 PVR = 4.1 WU FA sat = 96% PA sat = 68%, 69% Ao Pressure: 105/68 (82) LV Pressure: 107/4/14  Minimal coronary atherosclerosis.    12/12/12 CPX Peak VO2: 16.9 ml/kg/min % predicted peak VO2: 46.9% VE/VCO2 slope: 33.3 OUES: 1.36 Peak RER: 1.19 Moderate to severe functional limitation due to heart failure.  12/16/11 ECHO EF 15% global HK. No apical thrombus.  12/04/12 ECHO EF 15% RV mildly hypokinetic  06/18/14: Echo EF 20-25%,  grade III DD, mild MR, mod TR, RV sys fx moderately reduced  Labs 01/16/13 K 4.7 Creatinine 1.1 Labs 03/21/13 Dig level 0.3  Labs 06/20/13: K+ 3.4, Cr 0.94, pro-BNP 3235 Labs: 06/12/14 K 5.3 Creatinine 1.04 Pro BNP 2079. Dig level < 0.3  Labs: 07/21/13: K 5.0 Creatinine 1.57 Labs: 08/11/14: K 4.3 Creatinine 1.14 Magnesium 1.9 Labs 09/25/2014 Hgb 10.5   SH: Lives with wife in [place]. He is not a smoker or drink alcohol.   ROS: All systems negative except as listed in HPI, PMH and Problem List.  Past Medical History  Diagnosis Date  . Paroxysmal ventricular tachycardia 2005, 2010    s/p AICD '05, replaced w/ St. Jude's in 2010 (PVT/V.Fib arrest requiring ICD implant in 2005)  . HYPERTENSION, UNSPECIFIED   . HYPERLIPIDEMIA-MIXED   . 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 Nonischemic cardiomyopathy     2/2 Adriamycin administration for lymphoma  . CHF (congestive heart failure)     EF 15% by echo 2009; s/p St. Jude ICD  . Diabetes mellitus     Type 2  . Cancer 1992, 2007     non hodkins lymphoma 1992, Hodgkins 2007  . Depression   . LV (left ventricular) mural thrombus 2009    2009, on coumadin  . Small bowel obstruction 2001    s/p Small bowel resection 2001  . Jejunal intussusception 2008    2008  .  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 Splenic mass 2009    Noted on Abd [place] 2009 w/ recs for f/u CT - not done  . Hypotension   . Noncompliance with medication regimen   . AICD (automatic cardioverter/defibrillator) present 2005, 2010  . Presence of permanent cardiac pacemaker     Current Outpatient Prescriptions  Medication Sig Dispense Refill  . atorvastatin (LIPITOR) 40 MG tablet Take 0.5 tablets (20 mg total) by mouth at bedtime. 30 tablet 6  . digoxin (LANOXIN) 0.125 MG tablet Take 0.125 mg by mouth daily.    Marland Kitchen docusate sodium  (COLACE) 100 MG capsule Take 1 capsule (100 mg total) by mouth 2 (two) times daily. 10 capsule 0  . furosemide (LASIX) 40 MG tablet Take 1 tablet (40 mg total) by mouth as needed. 30 tablet   . glipiZIDE (GLUCOTROL) 5 MG tablet Take 5 mg by mouth daily before breakfast.    . HYDROcodone-acetaminophen (NORCO/VICODIN) 5-325 MG per tablet Take 1-2 tablets by mouth every 6 (six) hours as needed for moderate pain.    Marland Kitchen insulin glargine (LANTUS) 100 UNIT/ML injection Inject 0.05 mLs (5 Units total) into the skin at bedtime. 10 mL 11  . isosorbide dinitrate (ISORDIL) 5 MG tablet Take 5 mg by mouth 2 (two) times daily.    Marland Kitchen losartan (COZAAR) 100 MG tablet Take 50 mg by mouth at bedtime.     . Magnesium 400 MG TABS Take 1 tablet by mouth daily with breakfast. 90 tablet 3  . milrinone (PRIMACOR) 20 MG/100ML SOLN infusion Inject 19.5 mcg/min into the vein continuous. 100 mL 6  . spironolactone (ALDACTONE) 25 MG tablet Take 1 tablet (25 mg total) by mouth daily. 30 tablet 6  . warfarin (COUMADIN) 5 MG tablet Take 1 1/2 tablets daily. 45 tablet 3  . albuterol (PROVENTIL HFA;VENTOLIN HFA) 108 (90 BASE) MCG/ACT inhaler Inhale 1-2 puffs into the lungs every 6 (six) hours as needed for wheezing or shortness of breath.    . bisacodyl (DULCOLAX) 5 MG EC tablet Take 1 tablet (5 mg total) by mouth daily as needed for moderate constipation. 30 tablet 0   No current facility-administered medications for this encounter.     Filed Vitals:   09/30/14 1044  BP: 88/42  Pulse: 112  Weight: 165 lb 6.4 oz (75.025 kg)  SpO2: 98%   PHYSICAL EXAM: General:  Well appearing.  No resp difficulty Wife present. Milrinone infusing.  HEENT: normal Neck: supple. JVP flat Carotids 2+ bilaterally; no bruits. No lymphadenopathy or thryomegaly appreciated. Cor: PMI normal. Regular rate & rhythm. No rubs, gallops or murmurs. Lungs: CTA Abdomen: soft, nontender,  nondistended. No hepatosplenomegaly. No bruits or masses. Good bowel  sounds. Extremities: no cyanosis, clubbing, rash, edema Neuro: alert & orientedx3, cranial nerves grossly intact. Moves all 4 extremities w/o difficulty. Affect pleasant.  ASSESSMENT/ PLAN:  1. Chronic systolic HF: Nonischemic cardiomyopathy likely due to adriamycin; Last echo with EF 20-25%, grade III DD, mod TR, RV sys fx mod reduced (06/2014). St Jude ICD.  NYHA A class II symptoms on home milrinone.   He is not volume overloaded on exam, weight is stable.  Continue lasix as needed.  - Continue milrinone gtt at 0.25 mcg. Via PICC.  Continue AHC.  - Not on beta blocker with low output recently.  - Continue losartan 50 mg daily , spironolactone 25 mg daily  And dig 0.125 mg daily.  Plannning for LVAD the end of the month.  Check BMET, BNP , INR today.  2. H/o LV thrombus: He is on coumadin. Check INR now.  3. Non Hodgkins Lymphoma: He has h/o recurrent lymphoma (1993, 2007) treated with chemo (including adriamycin). Had follow up with Dr. Alen Blew with recommendations for VAD if indicated.   Labs K 4.8 Creatinine 1.72 BNP 115 INR 2.35 . He will be called regarding home diuretics. He will be instructed to stop lasix. Repeat BMET next week.   Darrick Grinder, NP-C  Patient seen and examined with Darrick Grinder, NP. We discussed all aspects of the encounter. I agree with the assessment and plan as stated above.   Overall doing very well on milrinone. Pending VAD placement in 2 weeks. Agree with holding diuretics unless weight going up.   Burnell Hurta,MD 11:29 PM

## 2014-10-01 ENCOUNTER — Telehealth (HOSPITAL_COMMUNITY): Payer: Self-pay

## 2014-10-01 NOTE — Telephone Encounter (Signed)
Spoke with wife and advised per Dr. Aundra Dubin to stop lasix completely.  Wife states he has been feeling sluggish today and bp was in 34s.  Advised to also drink some extra fluids today and call us tomorrow morning to see how he feels.  Aware and agreeable.  Renee Pain

## 2014-10-02 ENCOUNTER — Encounter (HOSPITAL_COMMUNITY): Payer: Medicare Other

## 2014-10-02 ENCOUNTER — Telehealth (HOSPITAL_COMMUNITY): Payer: Self-pay | Admitting: Infectious Diseases

## 2014-10-02 NOTE — Telephone Encounter (Signed)
Called Helix's wife to inform her of the plan for his surgical admission:  1. [facility] RN will check INR Tuesday 10/07/14 2. Last dose of coumadin will be 10/08/14 3. Plan for admission 10/10/14 to 2W  4. Surgery is scheduled for 10/13/14

## 2014-10-07 ENCOUNTER — Telehealth (HOSPITAL_COMMUNITY): Payer: Self-pay | Admitting: Vascular Surgery

## 2014-10-07 LAB — PROTIME-INR: INR: 3.6 — AB (ref <=1.1)

## 2014-10-07 NOTE — Telephone Encounter (Signed)
[facility] called .Marland Kitchen Pt 3.6 INR 43 .. PT taking 7.5 coumadin weight is up today 165, yesterday 164, last week 161. Pt has been holding lasix since 3/23 took first dose 3/28.Marland Kitchen Please advise

## 2014-10-07 NOTE — Telephone Encounter (Signed)
Will forward to provider for review.

## 2014-10-08 ENCOUNTER — Ambulatory Visit (HOSPITAL_COMMUNITY): Payer: Self-pay | Admitting: Infectious Diseases

## 2014-10-09 ENCOUNTER — Telehealth (HOSPITAL_COMMUNITY): Payer: Self-pay | Admitting: Cardiology

## 2014-10-09 NOTE — Telephone Encounter (Signed)
Nurse called during [facility] visit with low b/p reading  Pt states b/p was 72/55 @ 0700, pt took meds anyway and b/p @ 1000 68/50 Attempted to contact pt regarding sxs

## 2014-10-09 NOTE — Telephone Encounter (Signed)
Spoke with pts wife to see if pt is symptomatic with these b/p reading Per pts wife he is doing well Was advised by [facility] nurse to increase po liquids for the day Pt to be admitted in the AM  Advised will speak to provider to see if anything additional should be done, if not pt/pts family should report to hospital for admission as scheduled

## 2014-10-09 NOTE — Telephone Encounter (Signed)
Dr Haroldine Laws is aware, and states if pt is asymptomatic no changes for now as pt is being admitted in AM, wife aware and states pt is asypmtomatic

## 2014-10-10 ENCOUNTER — Inpatient Hospital Stay (HOSPITAL_COMMUNITY)
Admission: RE | Admit: 2014-10-10 | Discharge: 2014-10-24 | DRG: 002 | Disposition: A | Discharge status: Home / Self Care | Payer: Medicare Other | Source: Ambulatory Visit | Attending: Surgery | Admitting: Surgery

## 2014-10-10 ENCOUNTER — Encounter: Payer: Self-pay | Admitting: Internal Medicine

## 2014-10-10 DIAGNOSIS — Z9689 Presence of other specified functional implants: Secondary | ICD-10-CM

## 2014-10-10 DIAGNOSIS — Z794 Long term (current) use of insulin: Secondary | ICD-10-CM

## 2014-10-10 DIAGNOSIS — D62 Acute posthemorrhagic anemia: Secondary | ICD-10-CM | POA: Diagnosis not present

## 2014-10-10 DIAGNOSIS — I509 Heart failure, unspecified: Secondary | ICD-10-CM | POA: Diagnosis not present

## 2014-10-10 DIAGNOSIS — I513 Intracardiac thrombosis, not elsewhere classified: Secondary | ICD-10-CM | POA: Diagnosis present

## 2014-10-10 DIAGNOSIS — I97418 Intraoperative hemorrhage and hematoma of a circulatory system organ or structure complicating other circulatory system procedure: Secondary | ICD-10-CM | POA: Diagnosis not present

## 2014-10-10 DIAGNOSIS — D689 Coagulation defect, unspecified: Secondary | ICD-10-CM | POA: Diagnosis not present

## 2014-10-10 DIAGNOSIS — I313 Pericardial effusion (noninflammatory): Secondary | ICD-10-CM | POA: Diagnosis not present

## 2014-10-10 DIAGNOSIS — I69351 Hemiplegia and hemiparesis following cerebral infarction affecting right dominant side: Secondary | ICD-10-CM

## 2014-10-10 DIAGNOSIS — I97618 Postprocedural hemorrhage and hematoma of a circulatory system organ or structure following other circulatory system procedure: Secondary | ICD-10-CM | POA: Diagnosis not present

## 2014-10-10 DIAGNOSIS — I427 Cardiomyopathy due to drug and external agent: Secondary | ICD-10-CM | POA: Diagnosis present

## 2014-10-10 DIAGNOSIS — E785 Hyperlipidemia, unspecified: Secondary | ICD-10-CM | POA: Diagnosis present

## 2014-10-10 DIAGNOSIS — J942 Hemothorax: Secondary | ICD-10-CM | POA: Diagnosis not present

## 2014-10-10 DIAGNOSIS — Z9221 Personal history of antineoplastic chemotherapy: Secondary | ICD-10-CM | POA: Diagnosis not present

## 2014-10-10 DIAGNOSIS — Z95811 Presence of heart assist device: Secondary | ICD-10-CM | POA: Diagnosis not present

## 2014-10-10 DIAGNOSIS — Z95810 Presence of automatic (implantable) cardiac defibrillator: Secondary | ICD-10-CM

## 2014-10-10 DIAGNOSIS — C8590 Non-Hodgkin lymphoma, unspecified, unspecified site: Secondary | ICD-10-CM | POA: Diagnosis present

## 2014-10-10 DIAGNOSIS — Z79899 Other long term (current) drug therapy: Secondary | ICD-10-CM

## 2014-10-10 DIAGNOSIS — I471 Supraventricular tachycardia: Secondary | ICD-10-CM | POA: Diagnosis not present

## 2014-10-10 DIAGNOSIS — Y831 Surgical operation with implant of artificial internal device as the cause of abnormal reaction of the patient, or of later complication, without mention of misadventure at the time of the procedure: Secondary | ICD-10-CM | POA: Diagnosis not present

## 2014-10-10 DIAGNOSIS — I5043 Acute on chronic combined systolic (congestive) and diastolic (congestive) heart failure: Secondary | ICD-10-CM | POA: Diagnosis not present

## 2014-10-10 DIAGNOSIS — I5023 Acute on chronic systolic (congestive) heart failure: Secondary | ICD-10-CM | POA: Diagnosis not present

## 2014-10-10 DIAGNOSIS — Z978 Presence of other specified devices: Secondary | ICD-10-CM

## 2014-10-10 DIAGNOSIS — I312 Hemopericardium, not elsewhere classified: Secondary | ICD-10-CM | POA: Diagnosis not present

## 2014-10-10 DIAGNOSIS — D6959 Other secondary thrombocytopenia: Secondary | ICD-10-CM | POA: Diagnosis not present

## 2014-10-10 DIAGNOSIS — E119 Type 2 diabetes mellitus without complications: Secondary | ICD-10-CM | POA: Diagnosis present

## 2014-10-10 DIAGNOSIS — R509 Fever, unspecified: Secondary | ICD-10-CM | POA: Diagnosis not present

## 2014-10-10 DIAGNOSIS — T451X5A Adverse effect of antineoplastic and immunosuppressive drugs, initial encounter: Secondary | ICD-10-CM | POA: Diagnosis present

## 2014-10-10 DIAGNOSIS — Z7901 Long term (current) use of anticoagulants: Secondary | ICD-10-CM

## 2014-10-10 DIAGNOSIS — I5022 Chronic systolic (congestive) heart failure: Secondary | ICD-10-CM | POA: Diagnosis present

## 2014-10-10 DIAGNOSIS — R0602 Shortness of breath: Secondary | ICD-10-CM

## 2014-10-10 DIAGNOSIS — I5032 Chronic diastolic (congestive) heart failure: Secondary | ICD-10-CM

## 2014-10-10 LAB — COMPREHENSIVE METABOLIC PANEL
ALT: 16 U/L (ref 0–53)
AST: 19 U/L (ref 0–37)
Albumin: 3.7 g/dL (ref 3.5–5.2)
Alkaline Phosphatase: 76 U/L (ref 39–117)
Anion gap: 7 (ref 5–15)
BUN: 25 mg/dL — ABNORMAL HIGH (ref 6–23)
CO2: 26 mmol/L (ref 19–32)
Calcium: 9.2 mg/dL (ref 8.4–10.5)
Chloride: 101 mmol/L (ref 96–112)
Creatinine, Ser: 1.39 mg/dL — ABNORMAL HIGH (ref 0.50–1.35)
GFR calc Af Amer: 66 mL/min — ABNORMAL LOW (ref >90)
GFR calc non Af Amer: 57 mL/min — ABNORMAL LOW (ref >90)
Glucose, Bld: 113 mg/dL — ABNORMAL HIGH (ref 70–99)
Potassium: 4.2 mmol/L (ref 3.5–5.1)
Sodium: 134 mmol/L — ABNORMAL LOW (ref 135–145)
Total Bilirubin: 1.1 mg/dL (ref 0.3–1.2)
Total Protein: 6.7 g/dL (ref 6.0–8.3)

## 2014-10-10 LAB — CBC WITH DIFFERENTIAL/PLATELET
Basophils Absolute: 0 10*3/uL (ref 0.0–0.1)
Basophils Relative: 1 % (ref 0–1)
Eosinophils Absolute: 0.1 10*3/uL (ref 0.0–0.7)
Eosinophils Relative: 2 % (ref 0–5)
HCT: 26.9 % — ABNORMAL LOW (ref 39.0–52.0)
Hemoglobin: 9.2 g/dL — ABNORMAL LOW (ref 13.0–17.0)
Lymphocytes Relative: 19 % (ref 12–46)
Lymphs Abs: 1.3 10*3/uL (ref 0.7–4.0)
MCH: 29.9 pg (ref 26.0–34.0)
MCHC: 34.2 g/dL (ref 30.0–36.0)
MCV: 87.3 fL (ref 78.0–100.0)
Monocytes Absolute: 1 10*3/uL (ref 0.1–1.0)
Monocytes Relative: 15 % — ABNORMAL HIGH (ref 3–12)
Neutro Abs: 4.4 10*3/uL (ref 1.7–7.7)
Neutrophils Relative %: 63 % (ref 43–77)
Platelets: 274 10*3/uL (ref 150–400)
RBC: 3.08 MIL/uL — ABNORMAL LOW (ref 4.22–5.81)
RDW: 16.6 % — ABNORMAL HIGH (ref 11.5–15.5)
WBC: 6.8 10*3/uL (ref 4.0–10.5)

## 2014-10-10 LAB — GLUCOSE, CAPILLARY
GLUCOSE-CAPILLARY: 112 mg/dL — AB (ref 70–99)
Glucose-Capillary: 140 mg/dL — ABNORMAL HIGH (ref 70–99)

## 2014-10-10 LAB — PROTIME-INR
INR: 1.55 — ABNORMAL HIGH (ref 0.00–1.49)
Prothrombin Time: 18.7 seconds — ABNORMAL HIGH (ref 11.6–15.2)

## 2014-10-10 MED ORDER — DIGOXIN 125 MCG PO TABS
0.1250 mg | ORAL_TABLET | Freq: Every day | ORAL | Status: DC
  Administered 2014-10-11 – 2014-10-19 (×8): 0.125 mg via ORAL
  Filled 2014-10-10 (×11): qty 1

## 2014-10-10 MED ORDER — ALBUTEROL SULFATE (2.5 MG/3ML) 0.083% IN NEBU: 2.5000 mg | INHALATION_SOLUTION | Freq: Four times a day (QID) | RESPIRATORY_TRACT | Status: DC | PRN

## 2014-10-10 MED ORDER — INSULIN ASPART 100 UNIT/ML ~~LOC~~ SOLN: 0.0000 [IU] | Freq: Every day | SUBCUTANEOUS | Status: DC

## 2014-10-10 MED ORDER — DOCUSATE SODIUM 100 MG PO CAPS
100.0000 mg | ORAL_CAPSULE | Freq: Two times a day (BID) | ORAL | Status: DC
  Administered 2014-10-10 – 2014-10-12 (×5): 100 mg via ORAL
  Filled 2014-10-10 (×8): qty 1

## 2014-10-10 MED ORDER — HYDROCODONE-ACETAMINOPHEN 5-325 MG PO TABS: 1.0000 | ORAL_TABLET | Freq: Four times a day (QID) | ORAL | Status: DC | PRN

## 2014-10-10 MED ORDER — MILRINONE IN DEXTROSE 20 MG/100ML IV SOLN
0.2500 ug/kg/min | INTRAVENOUS | Status: DC
  Administered 2014-10-10 – 2014-10-12 (×4): 0.25 ug/kg/min via INTRAVENOUS
  Filled 2014-10-10 (×4): qty 100

## 2014-10-10 MED ORDER — ISOSORBIDE DINITRATE 5 MG PO TABS
5.0000 mg | ORAL_TABLET | Freq: Two times a day (BID) | ORAL | Status: DC
  Administered 2014-10-10 – 2014-10-12 (×5): 5 mg via ORAL
  Filled 2014-10-10 (×8): qty 1

## 2014-10-10 MED ORDER — MAGNESIUM OXIDE 400 (241.3 MG) MG PO TABS
400.0000 mg | ORAL_TABLET | Freq: Every day | ORAL | Status: DC
  Administered 2014-10-11 – 2014-10-19 (×7): 400 mg via ORAL
  Filled 2014-10-10 (×11): qty 1

## 2014-10-10 MED ORDER — GLIPIZIDE 5 MG PO TABS
5.0000 mg | ORAL_TABLET | Freq: Every day | ORAL | Status: DC
  Administered 2014-10-11 – 2014-10-12 (×2): 5 mg via ORAL
  Filled 2014-10-10 (×4): qty 1

## 2014-10-10 MED ORDER — ATORVASTATIN CALCIUM 20 MG PO TABS
20.0000 mg | ORAL_TABLET | Freq: Every day | ORAL | Status: DC
  Administered 2014-10-10 – 2014-10-23 (×12): 20 mg via ORAL
  Filled 2014-10-10 (×16): qty 1

## 2014-10-10 MED ORDER — MAGNESIUM 400 MG PO TABS
1.0000 | ORAL_TABLET | Freq: Every day | ORAL | Status: DC
Start: 2014-10-11 — End: 2014-10-10

## 2014-10-10 MED ORDER — HEPARIN (PORCINE) IN NACL 100-0.45 UNIT/ML-% IJ SOLN
800.0000 [IU]/h | INTRAMUSCULAR | Status: DC
  Administered 2014-10-10 – 2014-10-11 (×2): 800 [IU]/h via INTRAVENOUS
  Filled 2014-10-10 (×5): qty 250

## 2014-10-10 MED ORDER — BISACODYL 5 MG PO TBEC
5.0000 mg | DELAYED_RELEASE_TABLET | Freq: Every day | ORAL | Status: DC | PRN
  Filled 2014-10-10: qty 1

## 2014-10-10 MED ORDER — INSULIN ASPART 100 UNIT/ML ~~LOC~~ SOLN
0.0000 [IU] | Freq: Three times a day (TID) | SUBCUTANEOUS | Status: DC
  Administered 2014-10-11 (×3): 1 [IU] via SUBCUTANEOUS
  Administered 2014-10-12: 2 [IU] via SUBCUTANEOUS
  Administered 2014-10-12: 1 [IU] via SUBCUTANEOUS

## 2014-10-10 MED ORDER — SPIRONOLACTONE 25 MG PO TABS
25.0000 mg | ORAL_TABLET | Freq: Every day | ORAL | Status: DC
  Administered 2014-10-11 – 2014-10-12 (×2): 25 mg via ORAL
  Filled 2014-10-10 (×2): qty 1

## 2014-10-10 MED ORDER — INSULIN GLARGINE 100 UNIT/ML ~~LOC~~ SOLN
5.0000 [IU] | Freq: Every day | SUBCUTANEOUS | Status: DC
  Administered 2014-10-10 – 2014-10-12 (×3): 5 [IU] via SUBCUTANEOUS
  Filled 2014-10-10 (×4): qty 0.05

## 2014-10-10 NOTE — H&P (Addendum)
Advanced Heart Failure Team History and Physical Note   Reason for Admission: Chronic systolic HF. Pre-VAD optimization.    HPI:    54 y.o. 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 He is also has NYHA IVD functional class with home milrinone at 0.25 mcg. Being  Admitted for planned LVAD on 10/13/14  Recently admitted for [place] and EGD and studies looked good. Over past week has been feeling well but BP running low with systolics in 93A. Asymptomatic. Lasix and losartan held.   Says he feels great on milrinone. Minimal exertional dyspnea. No edema, orthopnea or PND. No bleeding on coumadin.   06/18/14: Echo EF 20-25%, grade III DD, mild MR, mod TR, RV sys fx moderately reduced   Review of Systems: [y] = yes, [ ]  = no   General: Weight gain [ ] ; Weight loss [ ] ; Anorexia [ ] ; Fatigue [ ] ; Fever [ ] ; Chills [ ] ; Weakness [ ]   Cardiac: Chest pain/pressure [ ] ; Resting SOB [ ] ; Exertional SOB [ ] ; Orthopnea [ ] ; Pedal Edema [ ] ; Palpitations [ ] ; Syncope [ ] ; Presyncope [ ] ; Paroxysmal nocturnal dyspnea[ ]   Pulmonary: Cough [ ] ; Wheezing[ ] ; Hemoptysis[ ] ; Sputum [ ] ; Snoring [ ]   GI: Vomiting[ ] ; Dysphagia[ ] ; Melena[ ] ; Hematochezia [ ] ; Heartburn[ ] ; Abdominal pain [ ] ; Constipation [ ] ; Diarrhea [ ] ; BRBPR [ ]   GU: Hematuria[ ] ; Dysuria [ ] ; Nocturia[ ]   Vascular: Pain in legs with walking [ ] ; Pain in feet with lying flat [ ] ; Non-healing sores [ ] ; Stroke [ y]; TIA [ ] ; Slurred speech [ ] ;  Neuro: Headaches[ ] ; Vertigo[ ] ; Seizures[ ] ; Paresthesias[ ] ;Blurred vision [ ] ; Diplopia [ ] ; Vision changes [ ]   Ortho/Skin: Arthritis Blue.Reese ]; Joint pain [ ] ; Muscle pain [ ] ;  Joint swelling [ ] ; Back Pain [ ] ; Rash [ ]   Psych: Depression[ ] ; Anxiety[ ]   Heme: Bleeding problems [ ] ; Clotting disorders [ ] ; Anemia [ ]   Endocrine: Diabetes [ ] ; Thyroid dysfunction[ ]   Home Medications Prior to Admission medications   Medication Sig Start Date End Date Taking? Authorizing Provider  albuterol (PROVENTIL HFA;VENTOLIN HFA) 108 (90 BASE) MCG/ACT inhaler Inhale 1-2 puffs into the lungs every 6 (six) hours as needed for wheezing or shortness of breath.    Historical Provider, MD  atorvastatin (LIPITOR) 40 MG tablet Take 0.5 tablets (20 mg total) by mouth at bedtime. 09/04/14   Larey Dresser, MD  bisacodyl (DULCOLAX) 5 MG EC tablet Take 1 tablet (5 mg total) by mouth daily as needed for moderate constipation. 09/04/14   Larey Dresser, MD  digoxin (LANOXIN) 0.125 MG tablet Take 0.125 mg by mouth daily.    Historical Provider, MD  docusate sodium (COLACE) 100 MG capsule Take 1 capsule (100 mg total) by mouth 2 (two) times daily. 09/04/14   Larey Dresser, MD  furosemide (LASIX) 40 MG tablet Take 1 tablet (40 mg total) by mouth as needed. 09/25/14   Amy D Clegg, NP  glipiZIDE (GLUCOTROL) 5 MG tablet Take 5 mg by mouth daily before breakfast.    Historical Provider, MD  HYDROcodone-acetaminophen (NORCO/VICODIN) 5-325 MG per tablet Take  1-2 tablets by mouth every 6 (six) hours as needed for moderate pain.    Historical Provider, MD  insulin glargine (LANTUS) 100 UNIT/ML injection Inject 0.05 mLs (5 Units total) into the skin at bedtime. 09/25/14   Amy D Ninfa Meeker, NP  isosorbide dinitrate (ISORDIL) 5 MG tablet Take 5 mg by mouth 2 (two) times daily.    Historical Provider, MD  losartan (COZAAR) 100 MG tablet Take 50 mg by mouth at bedtime.     Historical Provider, MD  Magnesium 400 MG TABS Take 1 tablet by mouth daily with breakfast. 09/04/14   Larey Dresser, MD  milrinone [facility]) 20 MG/100ML SOLN infusion Inject 19.5 mcg/min into the vein continuous. 06/23/14   Amy D Ninfa Meeker, NP    spironolactone (ALDACTONE) 25 MG tablet Take 1 tablet (25 mg total) by mouth daily. 09/04/14   Larey Dresser, MD  warfarin (COUMADIN) 5 MG tablet Take 1 1/2 tablets daily. 09/25/14   Amy Estrella Deeds, NP    Past Medical History: Past Medical History  Diagnosis Date  . Paroxysmal ventricular tachycardia 2005, 2010    s/p AICD '05, replaced w/ St. Jude's in 2010 (PVT/V.Fib arrest requiring ICD implant in 2005)  . HYPERTENSION, UNSPECIFIED   . HYPERLIPIDEMIA-MIXED   . 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 Nonischemic cardiomyopathy     2/2 Adriamycin administration for lymphoma  . CHF (congestive heart failure)     EF 15% by echo 2009; s/p St. Jude ICD  . Diabetes mellitus     Type 2  . Cancer 1992, 2007     non hodkins lymphoma 1992, Hodgkins 2007  . Depression   . LV (left ventricular) mural thrombus 2009    2009, on coumadin  . Small bowel obstruction 2001    s/p Small bowel resection 2001  . Jejunal intussusception 2008    2008  . 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 Splenic mass 2009    Noted on Abd [place] 2009 w/ recs for f/u CT - not done  . Hypotension   . Noncompliance with medication regimen   . AICD (automatic cardioverter/defibrillator) present 2005, 2010  . Presence of permanent cardiac pacemaker     Past Surgical History: Past Surgical History  Procedure Laterality Date  . Cardiac defibrillator placement      2005, replaced w/ St. Jude's 2010  . Vasectomy    . Bowel resection      small bowell 2/2 obstruction 2001  . Pacemaker insertion    . Insert / replace / remove pacemaker    . Colonoscopy N/A 09/18/2014    Procedure: COLONOSCOPY;  Surgeon: [name], MD;  Location: [facility] ENDOSCOPY;  Service: Endoscopy;  Laterality: N/A;  . Esophagogastroduodenoscopy N/A 09/18/2014    Procedure: ESOPHAGOGASTRODUODENOSCOPY (EGD);   Surgeon: [name], MD;  Location: [facility] ENDOSCOPY;  Service: Endoscopy;  Laterality: N/A;  . Left and right heart catheterization with coronary angiogram N/A 09/19/2014    Procedure: LEFT AND RIGHT HEART CATHETERIZATION WITH CORONARY ANGIOGRAM;  Surgeon: Jolaine Artist, MD;  Location: [facility] CATH LAB;  Service: Cardiovascular;  Laterality: N/A;  . Colonoscopy N/A 09/19/2014    Procedure: COLONOSCOPY;  Surgeon: [name], MD;  Location: [place];  Service: Gastroenterology;  Laterality: N/A;    Family History: Family History  Problem Relation Age  of Onset  . Other      No known family h/o heart disease  . Heart disease    . Diabetes Mother   . Other Other     complications from hip replacement    Social History: History   Social History  . Marital Status: Married    Spouse Name: N/A  . Number of Children: N/A  . Years of Education: N/A   Social History Main Topics  . Smoking status: Never Smoker   . Smokeless tobacco: Never Used  . Alcohol Use: 1.2 oz/week    1 Cans of beer, 1 Shots of liquor per week     Comment: ON SPECIAL OCCASIONS  . Drug Use: No  . Sexual Activity: No   Other Topics Concern  . Not on file   Social History Narrative    Allergies:  No Known Allergies  Objective:    Vital Signs:   Weight:  [75.025 kg (165 lb 6.4 oz)] 75.025 kg (165 lb 6.4 oz) (04/01 1347) Last BM Date: 10/08/14 Filed Weights   10/10/14 1347  Weight: 75.025 kg (165 lb 6.4 oz)    Physical Exam: General: Well appearing. No resp difficulty HEENT: normal Neck: supple. JVP ~6 . Carotids 2+ bilat; no bruits. No lymphadenopathy or thryomegaly appreciated. Cor: PMI nondisplaced. Regular rate & rhythm. No rubs, or murmurs.  Lungs: clear Abdomen: soft, nontender, nondistended. No hepatosplenomegaly. No bruits or masses. Good bowel sounds. Extremities: no cyanosis, clubbing, rash, edema. RUE double lumen PICC Neuro: alert & orientedx3, cranial nerves grossly intact. moves  all 4 extremities w/o difficulty v for mild R-sided weakness. Affect pleasant  Telemetry: SR 91  Labs: Basic Metabolic Panel: No results for input(s): NA, K, CL, CO2, GLUCOSE, BUN, CREATININE, CALCIUM, MG, PHOS in the last 168 hours.  Liver Function Tests: No results for input(s): AST, ALT, ALKPHOS, BILITOT, PROT, ALBUMIN in the last 168 hours. No results for input(s): LIPASE, AMYLASE in the last 168 hours. No results for input(s): AMMONIA in the last 168 hours.  CBC: No results for input(s): WBC, NEUTROABS, HGB, HCT, MCV, PLT in the last 168 hours.  Cardiac Enzymes: No results for input(s): CKTOTAL, CKMB, CKMBINDEX, TROPONINI in the last 168 hours.  BNP: BNP (last 3 results)  Recent Labs  09/15/14 1535 09/29/14 1053  BNP 100.8* 115.3*    ProBNP (last 3 results)  Recent Labs  06/12/14 1415 06/18/14 1200  PROBNP 2079.0* 2144.0*     CBG: No results for input(s): GLUCAP in the last 168 hours.  Coagulation Studies: No results for input(s): LABPROT, INR in the last 72 hours.  Other results: EKG: pending  Imaging:  No results found.      Assessment:   1. Chronic Systolic Heart Failure on home milrinone 0.25 mcg  2. NICM - EF ~20% chemo induced adriamycin 3. 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 Non Hodgkins Lymphoma 5. H/O CVA 2008  6. H/O VT/VF --St Jude ICD 2010  7. DMII  Plan/Discussion:     He is being admitted for pre-LVAD optimization. SBP has been low but currently 95-100. Will hold lasix and losartan. Continue milrinone. Will repeat echo. Coumadin has been on hold. Will start heparin if INR < 2.0. Cover sugars with SSI.   Length of Stay: 0  Glori Bickers MD 10/10/2014, 2:18 PM  Advanced Heart Failure Team Pager [phone] (M-F; 7a - 4p)  Please contact [facility] for night-coverage after hours (4p -7a ) and weekends on [email or web address]

## 2014-10-10 NOTE — Progress Notes (Signed)
[facility]  Patient Status: Active (receiving services up to time of hospitalization)  [facility] is providing the following services: RN and Home Infusion Services (teaching and education will be done by nurse in the home with patient and caregiver).  He has been readmitted for LVAD.  If patient discharges after hours, please call [phone].   Andre Tran 10/10/2014, 3:03 PM

## 2014-10-10 NOTE — Progress Notes (Addendum)
ANTICOAGULATION CONSULT NOTE - Initial Consult  Pharmacy Consult for warfarin> heparin bridge for LVAD placement 10/14/14 Indication: Hx CVA and LV thrombus  No Known Allergies  Patient Measurements: Height: 5\' 7"  (170.2 cm) Weight: 165 lb 6.4 oz (75.025 kg) IBW/kg (Calculated) : 66.1   Vital Signs:    Labs: No results for input(s): HGB, HCT, PLT, APTT, LABPROT, INR, HEPARINUNFRC, CREATININE, CKTOTAL, CKMB, TROPONINI in the last 72 hours.  Estimated Creatinine Clearance: 47.5 mL/min (by C-G formula based on Cr of 1.72).   Medical History: Past Medical History  Diagnosis Date  . Paroxysmal ventricular tachycardia 2005, 2010    s/p AICD '05, replaced w/ St. Jude's in 2010 (PVT/V.Fib arrest requiring ICD implant in 2005)  . HYPERTENSION, UNSPECIFIED   . HYPERLIPIDEMIA-MIXED   . 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 Nonischemic cardiomyopathy     2/2 Adriamycin administration for lymphoma  . CHF (congestive heart failure)     EF 15% by echo 2009; s/p St. Jude ICD  . Diabetes mellitus     Type 2  . Cancer 1992, 2007     non hodkins lymphoma 1992, Hodgkins 2007  . Depression   . LV (left ventricular) mural thrombus 2009    2009, on coumadin  . Small bowel obstruction 2001    s/p Small bowel resection 2001  . Jejunal intussusception 2008    2008  . 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 Splenic mass 2009    Noted on Abd [place] 2009 w/ recs for f/u CT - not done  . Hypotension   . Noncompliance with medication regimen   . AICD (automatic cardioverter/defibrillator) present 2005, 2010  . Presence of permanent cardiac pacemaker       Assessment: 53yom with hx HFrEF with plan for LVAD placement on 10/14/14.  He is on warfarin PTA for Hx CVA / LV thrombus.  He will need to be bridged with heparin when INR < 2 until OR on Monday.   Last INR 3.6 on 3/29.  He was instructed to hold warfarin starting 3/30 evening.   Home dose warfarin 5mg  TTS, 7.5mg  AOD  Goal of Therapy:  INR 2-3 Heparin level 0.3-0.7 units/ml Monitor platelets by anticoagulation protocol: Yes   Plan:  Await admission INR Begin Heparin when INR < 2  Bonnita Nasuti Pharm.D. CPP, BCPS Clinical Pharmacist [phone] 10/10/2014 4:25 PM    Addendum: Admit INR is 1.55 so will start heparin. Last admission he was consistently therapeutic on 800 units/hr.  Plan: 1) Begin heparin at 800 units/hr with no bolus 2) Check 6 hour heparin level 3) Daily heparin level and CBC  Nena Jordan, PharmD, BCPS 10/10/2014, 6:05 PM

## 2014-10-11 ENCOUNTER — Encounter (HOSPITAL_COMMUNITY): Payer: Self-pay | Admitting: *Deleted

## 2014-10-11 ENCOUNTER — Inpatient Hospital Stay (HOSPITAL_COMMUNITY): Payer: Medicare Other

## 2014-10-11 DIAGNOSIS — I509 Heart failure, unspecified: Secondary | ICD-10-CM

## 2014-10-11 LAB — SURGICAL PCR SCREEN
MRSA, PCR: NEGATIVE
Staphylococcus aureus: NEGATIVE

## 2014-10-11 LAB — GLUCOSE, CAPILLARY
GLUCOSE-CAPILLARY: 136 mg/dL — AB (ref 70–99)
GLUCOSE-CAPILLARY: 129 mg/dL — AB (ref 70–99)
Glucose-Capillary: 136 mg/dL — ABNORMAL HIGH (ref 70–99)
Glucose-Capillary: 143 mg/dL — ABNORMAL HIGH (ref 70–99)

## 2014-10-11 LAB — URINE MICROSCOPIC-ADD ON

## 2014-10-11 LAB — HEPARIN LEVEL (UNFRACTIONATED)
HEPARIN UNFRACTIONATED: 0.75 [IU]/mL — AB (ref 0.30–0.70)
Heparin Unfractionated: 0.26 IU/mL — ABNORMAL LOW (ref 0.30–0.70)

## 2014-10-11 LAB — BASIC METABOLIC PANEL
Anion gap: 6 (ref 5–15)
BUN: 24 mg/dL — ABNORMAL HIGH (ref 6–23)
CO2: 25 mmol/L (ref 19–32)
Calcium: 8.7 mg/dL (ref 8.4–10.5)
Chloride: 100 mmol/L (ref 96–112)
Creatinine, Ser: 1.29 mg/dL (ref 0.50–1.35)
GFR calc Af Amer: 73 mL/min — ABNORMAL LOW (ref >90)
GFR calc non Af Amer: 63 mL/min — ABNORMAL LOW (ref >90)
Glucose, Bld: 128 mg/dL — ABNORMAL HIGH (ref 70–99)
Potassium: 4.3 mmol/L (ref 3.5–5.1)
Sodium: 131 mmol/L — ABNORMAL LOW (ref 135–145)

## 2014-10-11 LAB — CBC
HEMATOCRIT: 25.1 % — AB (ref 39.0–52.0)
HEMOGLOBIN: 8.6 g/dL — AB (ref 13.0–17.0)
MCH: 29.9 pg (ref 26.0–34.0)
MCHC: 34.3 g/dL (ref 30.0–36.0)
MCV: 87.2 fL (ref 78.0–100.0)
PLATELETS: 220 10*3/uL (ref 150–400)
RBC: 2.88 MIL/uL — ABNORMAL LOW (ref 4.22–5.81)
RDW: 16.3 % — ABNORMAL HIGH (ref 11.5–15.5)
WBC: 5.9 10*3/uL (ref 4.0–10.5)

## 2014-10-11 LAB — URINALYSIS, ROUTINE W REFLEX MICROSCOPIC
Bilirubin Urine: NEGATIVE
Glucose, UA: NEGATIVE mg/dL
Hgb urine dipstick: NEGATIVE
Ketones, ur: NEGATIVE mg/dL
Leukocytes, UA: NEGATIVE
Nitrite: NEGATIVE
Protein, ur: 30 mg/dL — AB
Specific Gravity, Urine: 1.021 (ref 1.005–1.030)
Urobilinogen, UA: 1 mg/dL (ref 0.0–1.0)
pH: 5.5 (ref 5.0–8.0)

## 2014-10-11 MED ORDER — FE FUMARATE-B12-VIT C-FA-IFC PO CAPS
1.0000 | ORAL_CAPSULE | Freq: Three times a day (TID) | ORAL | Status: DC
  Administered 2014-10-11 – 2014-10-12 (×5): 1 via ORAL
  Filled 2014-10-11 (×9): qty 1

## 2014-10-11 NOTE — Progress Notes (Signed)
Advanced Heart Failure Rounding Note   Subjective:     Feels good. No SOB. SBP 84-110. No dizziness. On heparin.    Objective:   Weight Range:  Vital Signs:   Temp:  [98.9 F (37.2 C)-100.5 F (38.1 C)] 98.9 F (37.2 C) (04/02 0701) Pulse Rate:  [95-110] 95 (04/02 0701) Resp:  [20] 20 (04/01 2221) BP: (84-111)/(53-60) 84/53 mmHg (04/02 0701) SpO2:  [95 %-100 %] 95 % (04/02 0701) Weight:  [74.435 kg (164 lb 1.6 oz)-75.025 kg (165 lb 6.4 oz)] 74.435 kg (164 lb 1.6 oz) (04/02 0701) Last BM Date: 10/10/15  Weight change: Filed Weights   10/10/14 1347 10/11/14 0701  Weight: 75.025 kg (165 lb 6.4 oz) 74.435 kg (164 lb 1.6 oz)    Intake/Output:   Intake/Output Summary (Last 24 hours) at 10/11/14 0741 Last data filed at 10/10/14 1835  Gross per 24 hour  Intake    399 ml  Output    200 ml  Net    199 ml     Physical Exam: General: Well appearing. No resp difficulty HEENT: normal Neck: supple. JVP ~6 . Carotids 2+ bilat; no bruits. No lymphadenopathy or thryomegaly appreciated. Cor: PMI laterally displaced. Regular rate & rhythm. No rubs, or murmurs.  Lungs: clear Abdomen: soft, nontender, nondistended. No hepatosplenomegaly. No bruits or masses. Good bowel sounds. Extremities: no cyanosis, clubbing, rash, edema. RUE double lumen PICC Neuro: alert & orientedx3, cranial nerves grossly intact. moves all 4 extremities w/o difficulty v for mild R-sided weakness. Affect pleasant  Telemetry: SR 80s  Labs: Basic Metabolic Panel:  Recent Labs Lab 10/10/14 1510 10/11/14 0235  NA 134* 131*  K 4.2 4.3  CL 101 100  CO2 26 25  GLUCOSE 113* 128*  BUN 25* 24*  CREATININE 1.39* 1.29  CALCIUM 9.2 8.7    Liver Function Tests:  Recent Labs Lab 10/10/14 1510  AST 19  ALT 16  ALKPHOS 76  BILITOT 1.1  PROT 6.7  ALBUMIN 3.7   No results for input(s): LIPASE, AMYLASE in the last 168 hours. No results for input(s): AMMONIA in the last 168 hours.  CBC:  Recent  Labs Lab 10/10/14 1510 10/11/14 0235  WBC 6.8 5.9  NEUTROABS 4.4  --   HGB 9.2* 8.6*  HCT 26.9* 25.1*  MCV 87.3 87.2  PLT 274 220    Cardiac Enzymes: No results for input(s): CKTOTAL, CKMB, CKMBINDEX, TROPONINI in the last 168 hours.  BNP: BNP (last 3 results)  Recent Labs  09/15/14 1535 09/29/14 1053  BNP 100.8* 115.3*    ProBNP (last 3 results)  Recent Labs  06/12/14 1415 06/18/14 1200  PROBNP 2079.0* 2144.0*      Other results:  Imaging:  No results found.   Medications:     Scheduled Medications: . atorvastatin  20 mg Oral QHS  . digoxin  0.125 mg Oral Daily  . docusate sodium  100 mg Oral BID  . glipiZIDE  5 mg Oral QAC breakfast  . insulin aspart  0-5 Units Subcutaneous QHS  . insulin aspart  0-9 Units Subcutaneous TID WC  . insulin glargine  5 Units Subcutaneous QHS  . isosorbide dinitrate  5 mg Oral BID  . magnesium oxide  400 mg Oral Q breakfast  . spironolactone  25 mg Oral Daily     Infusions: . heparin 900 Units/hr (10/11/14 0308)  . milrinone 0.25 mcg/kg/min (10/11/14 0721)     PRN Medications:  albuterol, bisacodyl, HYDROcodone-acetaminophen   Assessment:   1.  Chronic Systolic Heart Failure on home milrinone 0.25 mcg  2. NICM - EF ~20% chemo induced adriamycin 3. 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 Non Hodgkins Lymphoma 5. H/O CVA 2008  6. H/O VT/VF --St Jude ICD 2010  7. DMII   Plan/Discussion:    Doing well. Echo today. For VAD Monday. Continue milrinone. Hgb drifting down. Will follow. On heparin as INR  <2.   Length of Stay: 1   Glori Bickers MD 10/11/2014, 7:41 AM  Advanced Heart Failure Team Pager [phone] (M-F; 7a - 4p)  Please contact [facility] for night-coverage after hours (4p -7a ) and weekends on [email or web address]

## 2014-10-11 NOTE — Progress Notes (Signed)
Pt ambulated in hallway 550 ft on room air independently. Pt tolerated activity well. Will continue to monitor.

## 2014-10-11 NOTE — Progress Notes (Signed)
ANTICOAGULATION CONSULT NOTE - Follow Up Consult  Pharmacy Consult for warfarin>heparin bridge for LVAD 4/4 Indication: Hx CVA/LV thrombus  No Known Allergies  Patient Measurements: Height: 5\' 7"  (170.2 cm) Weight: 164 lb 1.6 oz (74.435 kg) IBW/kg (Calculated) : 66.1  Vital Signs: Temp: 98.9 F (37.2 C) (04/02 0701) Temp Source: Oral (04/02 0701) BP: 101/62 mmHg (04/02 1107) Pulse Rate: 99 (04/02 1107)  Labs:  Recent Labs  10/10/14 1510 10/11/14 0235 10/11/14 1140  HGB 9.2* 8.6*  --   HCT 26.9* 25.1*  --   PLT 274 220  --   LABPROT 18.7*  --   --   INR 1.55*  --   --   HEPARINUNFRC  --  0.26* 0.75*  CREATININE 1.39* 1.29  --     Estimated Creatinine Clearance: 63.3 mL/min (by C-G formula based on Cr of 1.29).  Assessment: 54 year old male with hx of LV thrombus on coumadin pta.   Warfarin on hold for LVAD planned for 4/4. Heparin started, level just above goal this afternoon after adjustment this am. Has been therapeutic on 800 units/hr in past.  Goal of Therapy:  Heparin level 0.3-0.7 units/ml Monitor platelets by anticoagulation protocol: Yes   Plan:  -Decrease heparin back to 800 units/hr -Daily CBC/HL -Monitor for bleeding  Erin Hearing PharmD., BCPS Clinical Pharmacist Pager [phone] 10/11/2014 1:44 PM

## 2014-10-11 NOTE — Progress Notes (Signed)
Procedure(s) (LRB): INSERTION OF IMPLANTABLE LEFT VENTRICULAR ASSIST DEVICE (N/A) TRANSESOPHAGEAL ECHOCARDIOGRAM (TEE) (N/A) Subjective: 54 yo admitted for heparin bridge for Heartmate 2 destination therapy VAD implant planned April 4 EF 15-20 with non ischemicCM from adriamycin for lymphoma preop Echo, CXR pending Creat improved last 2 wks-- 1.7 to 1.3 Hb decreased 9.2 to 8.6  INR1.55 on admisson -- will start po iron check stool  [place] Temp on admission 100.5 but patient states room   Was warm and he had his coat on admit since then temps are normal  Objective: Vital signs in last 24 hours: Temp:  [98.9 F (37.2 C)-100.5 F (38.1 C)] 98.9 F (37.2 C) (04/02 0701) Pulse Rate:  [91-110] 99 (04/02 1107) Cardiac Rhythm:  [-] Other (Comment) (04/01 2045) Resp:  [20] 20 (04/01 2221) BP: (84-111)/(53-62) 101/62 mmHg (04/02 1107) SpO2:  [95 %-100 %] 95 % (04/02 0701) Weight:  [164 lb 1.6 oz (74.435 kg)-165 lb 6.4 oz (75.025 kg)] 164 lb 1.6 oz (74.435 kg) (04/02 0701)  Hemodynamic parameters for last 24 hours:  nsr  Intake/Output from previous day: 04/01 0701 - 04/02 0700 In: 399 [P.O.:399] Out: 200 [Urine:200] Intake/Output this shift:    No edema Lungs clear No murmur Baseline R side weakness from old basal   ganlion CVA  Lab Results:  Recent Labs  10/10/14 1510 10/11/14 0235  WBC 6.8 5.9  HGB 9.2* 8.6*  HCT 26.9* 25.1*  PLT 274 220   BMET:  Recent Labs  10/10/14 1510 10/11/14 0235  NA 134* 131*  K 4.2 4.3  CL 101 100  CO2 26 25  GLUCOSE 113* 128*  BUN 25* 24*  CREATININE 1.39* 1.29  CALCIUM 9.2 8.7    PT/INR:  Recent Labs  10/10/14 1510  LABPROT 18.7*  INR 1.55*   ABG    Component Value Date/Time   PHART 7.383 09/19/2014 1217   HCO3 23.4 09/19/2014 1226   HCO3 23.4 09/19/2014 1226   TCO2 25 09/19/2014 1226   TCO2 25 09/19/2014 1226   ACIDBASEDEF 2.0 09/19/2014 1226   ACIDBASEDEF 2.0 09/19/2014 1226   O2SAT 69.0 09/19/2014 1226   O2SAT  68.0 09/19/2014 1226   CBG (last 3)   Recent Labs  10/10/14 1644 10/10/14 2215 10/11/14 0652  GLUCAP 140* 112* 136*    Assessment/Plan: S/P Procedure(s) (LRB): INSERTION OF IMPLANTABLE LEFT VENTRICULAR ASSIST DEVICE (N/A) TRANSESOPHAGEAL ECHOCARDIOGRAM (TEE) (N/A) will place preop orders in am-followup echo and CXR  Patient understands that the VAD is being placed for DT therapy because he was discussed at the [facility] transplant listing conference last month and was not felt to be transplant candidate due to hx of lymphoma and CVA   LOS: 1 day    Tharon Aquas Trigt III 10/11/2014

## 2014-10-11 NOTE — Progress Notes (Signed)
ANTICOAGULATION CONSULT NOTE - Follow Up Consult  Pharmacy Consult for warfarin>heparin bridge for LVAD 4/5 Indication: Hx CVA/LV thrombus  No Known Allergies  Patient Measurements: Height: 5\' 7"  (170.2 cm) Weight: 165 lb 6.4 oz (75.025 kg) IBW/kg (Calculated) : 66.1  Vital Signs: Temp: 100.5 F (38.1 C) (04/01 2221) Temp Source: Oral (04/01 2221) BP: 111/60 mmHg (04/01 2221) Pulse Rate: 110 (04/01 2221)  Labs:  Recent Labs  10/10/14 1510 10/11/14 0235  HGB 9.2* 8.6*  HCT 26.9* 25.1*  PLT 274 220  LABPROT 18.7*  --   INR 1.55*  --   HEPARINUNFRC  --  0.26*  CREATININE 1.39*  --     Estimated Creatinine Clearance: 58.8 mL/min (by C-G formula based on Cr of 1.39).  Assessment: Slightly sub-therapeutic heparin level, no issues per RN.   Goal of Therapy:  Heparin level 0.3-0.7 units/ml Monitor platelets by anticoagulation protocol: Yes   Plan:  -Increase heparin to 900 units/hr -1100 HL -Daily CBC/HL -Monitor for bleeding  Narda Bonds 10/11/2014,3:04 AM

## 2014-10-11 NOTE — Progress Notes (Signed)
  Echocardiogram 2D Echocardiogram has been performed.  Andre Tran 10/11/2014, 3:56 PM

## 2014-10-12 DIAGNOSIS — R509 Fever, unspecified: Secondary | ICD-10-CM

## 2014-10-12 DIAGNOSIS — I5023 Acute on chronic systolic (congestive) heart failure: Secondary | ICD-10-CM

## 2014-10-12 LAB — CBC
HEMATOCRIT: 24.8 % — AB (ref 39.0–52.0)
HEMOGLOBIN: 8.5 g/dL — AB (ref 13.0–17.0)
MCH: 30.1 pg (ref 26.0–34.0)
MCHC: 34.3 g/dL (ref 30.0–36.0)
MCV: 87.9 fL (ref 78.0–100.0)
Platelets: 222 10*3/uL (ref 150–400)
RBC: 2.82 MIL/uL — ABNORMAL LOW (ref 4.22–5.81)
RDW: 16.8 % — AB (ref 11.5–15.5)
WBC: 7 10*3/uL (ref 4.0–10.5)

## 2014-10-12 LAB — COMPREHENSIVE METABOLIC PANEL
ALT: 27 U/L (ref 0–53)
AST: 31 U/L (ref 0–37)
Albumin: 3.4 g/dL — ABNORMAL LOW (ref 3.5–5.2)
Alkaline Phosphatase: 78 U/L (ref 39–117)
Anion gap: 7 (ref 5–15)
BUN: 18 mg/dL (ref 6–23)
CO2: 24 mmol/L (ref 19–32)
Calcium: 8.7 mg/dL (ref 8.4–10.5)
Chloride: 98 mmol/L (ref 96–112)
Creatinine, Ser: 1.38 mg/dL — ABNORMAL HIGH (ref 0.50–1.35)
GFR calc Af Amer: 67 mL/min — ABNORMAL LOW (ref >90)
GFR calc non Af Amer: 58 mL/min — ABNORMAL LOW (ref >90)
Glucose, Bld: 222 mg/dL — ABNORMAL HIGH (ref 70–99)
Potassium: 4.2 mmol/L (ref 3.5–5.1)
Sodium: 129 mmol/L — ABNORMAL LOW (ref 135–145)
Total Bilirubin: 0.7 mg/dL (ref 0.3–1.2)
Total Protein: 6.8 g/dL (ref 6.0–8.3)

## 2014-10-12 LAB — GLUCOSE, CAPILLARY
GLUCOSE-CAPILLARY: 105 mg/dL — AB (ref 70–99)
GLUCOSE-CAPILLARY: 177 mg/dL — AB (ref 70–99)
Glucose-Capillary: 143 mg/dL — ABNORMAL HIGH (ref 70–99)
Glucose-Capillary: 124 mg/dL — ABNORMAL HIGH (ref 70–99)

## 2014-10-12 LAB — BASIC METABOLIC PANEL
Anion gap: 8 (ref 5–15)
BUN: 16 mg/dL (ref 6–23)
CO2: 22 mmol/L (ref 19–32)
Calcium: 9 mg/dL (ref 8.4–10.5)
Chloride: 102 mmol/L (ref 96–112)
Creatinine, Ser: 1.16 mg/dL (ref 0.50–1.35)
GFR calc Af Amer: 83 mL/min — ABNORMAL LOW (ref >90)
GFR calc non Af Amer: 71 mL/min — ABNORMAL LOW (ref >90)
Glucose, Bld: 123 mg/dL — ABNORMAL HIGH (ref 70–99)
Potassium: 5.2 mmol/L — ABNORMAL HIGH (ref 3.5–5.1)
Sodium: 132 mmol/L — ABNORMAL LOW (ref 135–145)

## 2014-10-12 LAB — PREPARE RBC (CROSSMATCH)

## 2014-10-12 LAB — HEPARIN LEVEL (UNFRACTIONATED): Heparin Unfractionated: 0.44 IU/mL (ref 0.30–0.70)

## 2014-10-12 LAB — PROTIME-INR
INR: 1.2 (ref 0.00–1.49)
Prothrombin Time: 15.3 seconds — ABNORMAL HIGH (ref 11.6–15.2)

## 2014-10-12 MED ORDER — POTASSIUM CHLORIDE 2 MEQ/ML IV SOLN
80.0000 meq | INTRAVENOUS | Status: DC
  Filled 2014-10-12: qty 40

## 2014-10-12 MED ORDER — MILRINONE IN DEXTROSE 20 MG/100ML IV SOLN
0.3000 ug/kg/min | INTRAVENOUS | Status: DC
  Filled 2014-10-12: qty 100

## 2014-10-12 MED ORDER — FLUCONAZOLE IN SODIUM CHLORIDE 400-0.9 MG/200ML-% IV SOLN
400.0000 mg | INTRAVENOUS | Status: AC
  Administered 2014-10-13: 400 mg via INTRAVENOUS
  Filled 2014-10-12: qty 200

## 2014-10-12 MED ORDER — VASOPRESSIN 20 UNIT/ML IV SOLN
0.0400 [IU]/min | INTRAVENOUS | Status: DC
  Filled 2014-10-12 (×2): qty 2

## 2014-10-12 MED ORDER — DEXMEDETOMIDINE HCL IN NACL 400 MCG/100ML IV SOLN
0.1000 ug/kg/h | INTRAVENOUS | Status: AC
  Administered 2014-10-13: 0.2 ug/kg/h via INTRAVENOUS
  Filled 2014-10-12: qty 100

## 2014-10-12 MED ORDER — DEXTROSE 5 % IV SOLN
1.5000 g | INTRAVENOUS | Status: AC
  Administered 2014-10-13: 1.5 g via INTRAVENOUS
  Administered 2014-10-13: .75 g via INTRAVENOUS
  Filled 2014-10-12: qty 1.5

## 2014-10-12 MED ORDER — DEXTROSE 5 % IV SOLN
750.0000 mg | INTRAVENOUS | Status: DC
  Filled 2014-10-12: qty 750

## 2014-10-12 MED ORDER — DOBUTAMINE IN D5W 4-5 MG/ML-% IV SOLN
2.0000 ug/kg/min | INTRAVENOUS | Status: DC
  Filled 2014-10-12: qty 250

## 2014-10-12 MED ORDER — VANCOMYCIN HCL 1000 MG IV SOLR
1000.0000 mg | INTRAVENOUS | Status: AC
  Administered 2014-10-13: 2 g
  Filled 2014-10-12 (×4): qty 1000

## 2014-10-12 MED ORDER — DOPAMINE-DEXTROSE 3.2-5 MG/ML-% IV SOLN
0.0000 ug/kg/min | INTRAVENOUS | Status: DC
Start: 2014-10-13 — End: 2014-10-13
  Filled 2014-10-12: qty 250

## 2014-10-12 MED ORDER — PHENYLEPHRINE HCL 10 MG/ML IJ SOLN
0.0000 ug/min | INTRAVENOUS | Status: DC
  Filled 2014-10-12: qty 2

## 2014-10-12 MED ORDER — DIAZEPAM 5 MG PO TABS
5.0000 mg | ORAL_TABLET | Freq: Once | ORAL | Status: DC
  Filled 2014-10-12: qty 1

## 2014-10-12 MED ORDER — MAGNESIUM SULFATE 50 % IJ SOLN
40.0000 meq | INTRAMUSCULAR | Status: DC
  Filled 2014-10-12: qty 10

## 2014-10-12 MED ORDER — SODIUM CHLORIDE 0.9 % IV SOLN
INTRAVENOUS | Status: DC
  Filled 2014-10-12: qty 30

## 2014-10-12 MED ORDER — RIFAMPIN 300 MG PO CAPS
600.0000 mg | ORAL_CAPSULE | Freq: Once | ORAL | Status: AC
  Administered 2014-10-13: 600 mg via ORAL
  Filled 2014-10-12: qty 2

## 2014-10-12 MED ORDER — BISACODYL 5 MG PO TBEC
5.0000 mg | DELAYED_RELEASE_TABLET | Freq: Once | ORAL | Status: AC
  Administered 2014-10-12: 5 mg via ORAL

## 2014-10-12 MED ORDER — CHLORHEXIDINE GLUCONATE 4 % EX LIQD
60.0000 mL | Freq: Once | CUTANEOUS | Status: AC
  Administered 2014-10-13: 4 via TOPICAL
  Filled 2014-10-12: qty 60

## 2014-10-12 MED ORDER — SODIUM CHLORIDE 0.9 % IV SOLN
INTRAVENOUS | Status: AC
  Administered 2014-10-13: 2.4 [IU]/h via INTRAVENOUS
  Filled 2014-10-12: qty 2.5

## 2014-10-12 MED ORDER — EPINEPHRINE HCL 1 MG/ML IJ SOLN
0.0000 ug/min | INTRAMUSCULAR | Status: AC
  Administered 2014-10-13: 1 ug/min via INTRAVENOUS
  Filled 2014-10-12: qty 4

## 2014-10-12 MED ORDER — NOREPINEPHRINE BITARTRATE 1 MG/ML IV SOLN
0.0000 ug/min | INTRAVENOUS | Status: AC
  Administered 2014-10-13: 3 ug/min via INTRAVENOUS
  Filled 2014-10-12: qty 4

## 2014-10-12 MED ORDER — VANCOMYCIN HCL 10 G IV SOLR
1250.0000 mg | INTRAVENOUS | Status: AC
  Administered 2014-10-13: 1250 mg via INTRAVENOUS
  Filled 2014-10-12: qty 1250

## 2014-10-12 MED ORDER — CHLORHEXIDINE GLUCONATE 4 % EX LIQD
60.0000 mL | Freq: Once | CUTANEOUS | Status: DC
  Filled 2014-10-12: qty 60

## 2014-10-12 MED ORDER — NITROGLYCERIN IN D5W 200-5 MCG/ML-% IV SOLN
0.0000 ug/min | INTRAVENOUS | Status: DC
  Filled 2014-10-12: qty 250

## 2014-10-12 MED ORDER — CHLORHEXIDINE GLUCONATE 4 % EX LIQD
60.0000 mL | Freq: Once | CUTANEOUS | Status: AC
  Administered 2014-10-12: 4 via TOPICAL
  Filled 2014-10-12 (×2): qty 60

## 2014-10-12 MED ORDER — SODIUM CHLORIDE 0.9 % IV SOLN
INTRAVENOUS | Status: AC
  Administered 2014-10-13: 69.8 mL/h via INTRAVENOUS
  Filled 2014-10-12: qty 40

## 2014-10-12 MED ORDER — TEMAZEPAM 15 MG PO CAPS
15.0000 mg | ORAL_CAPSULE | Freq: Once | ORAL | Status: AC | PRN
  Administered 2014-10-12: 15 mg via ORAL
  Filled 2014-10-12: qty 1

## 2014-10-12 MED ORDER — MUPIROCIN 2 % EX OINT
1.0000 "application " | TOPICAL_OINTMENT | Freq: Two times a day (BID) | CUTANEOUS | Status: AC
  Administered 2014-10-12 (×2): 1 via NASAL
  Filled 2014-10-12: qty 22

## 2014-10-12 NOTE — Progress Notes (Signed)
ANTICOAGULATION CONSULT NOTE - Follow Up Consult  Pharmacy Consult for warfarin>heparin bridge for LVAD 4/4 Indication: Hx CVA/LV thrombus  No Known Allergies  Patient Measurements: Height: 5\' 6"  (167.6 cm) Weight: 164 lb (74.39 kg) IBW/kg (Calculated) : 63.8  Vital Signs: Temp: 98.3 F (36.8 C) (04/03 0700) Temp Source: Oral (04/03 0700) BP: 85/51 mmHg (04/03 1017) Pulse Rate: 85 (04/03 1017)  Labs:  Recent Labs  10/10/14 1510 10/11/14 0235 10/11/14 1140 10/12/14 0610  HGB 9.2* 8.6*  --  8.5*  HCT 26.9* 25.1*  --  24.8*  PLT 274 220  --  222  LABPROT 18.7*  --   --  15.3*  INR 1.55*  --   --  1.20  HEPARINUNFRC  --  0.26* 0.75* 0.44  CREATININE 1.39* 1.29  --  1.16    Estimated Creatinine Clearance: 68 mL/min (by C-G formula based on Cr of 1.16).  Assessment: 54 year old male with hx of LV thrombus on coumadin pta.   Warfarin on hold for LVAD planned for 4/4.   Heparin started 4/1, level at goal this morning. No bleeding issues noted, hgb low but stable. Surgery in am.  Goal of Therapy:  Heparin level 0.3-0.7 units/ml Monitor platelets by anticoagulation protocol: Yes   Plan:  -Continue heparin at 800 units/hr -Daily CBC/HL -Monitor for bleeding -follow up stop time for heparin  Erin Hearing PharmD., BCPS Clinical Pharmacist Pager [phone] 10/12/2014 10:56 AM

## 2014-10-12 NOTE — Progress Notes (Signed)
Cardiothoracic Surgery  Subjective:  No complaints  He had a temp of 100.1 last pm but normal temp since. WBC 7.0, UA negative, CXR ok, BC sent for surveillance. He has had a PICC for 3 months which will be removed in the am.   Objective: Vital signs in last 24 hours: Temp:  [98.3 F (36.8 C)-100.1 F (37.8 C)] 98.8 F (37.1 C) (04/03 1621) Pulse Rate:  [82-104] 100 (04/03 1621) Cardiac Rhythm:  [-] Normal sinus rhythm (04/03 2014) Resp:  [18] 18 (04/03 1621) BP: (85-121)/(51-58) 121/58 mmHg (04/03 1621) SpO2:  [100 %] 100 % (04/03 1621) Weight:  [74.39 kg (164 lb)] 74.39 kg (164 lb) (04/03 0700)  Hemodynamic parameters for last 24 hours:    Intake/Output from previous day: 04/02 0701 - 04/03 0700 In: 240 [P.O.:240] Out: 400 [Urine:400] Intake/Output this shift:    General appearance: alert and cooperative Neurologic: mild right sided weakness. Heart: regular rate and rhythm, S1, S2 normal, no murmur, click, rub or gallop Lungs: clear to auscultation bilaterally Abdomen: soft, non-tender; bowel sounds normal; no masses,  no organomegaly Extremities: extremities normal, atraumatic, no cyanosis or edema  Lab Results:  Recent Labs  10/11/14 0235 10/12/14 0610  WBC 5.9 7.0  HGB 8.6* 8.5*  HCT 25.1* 24.8*  PLT 220 222   BMET:  Recent Labs  10/12/14 0610 10/12/14 0946  NA 132* 129*  K 5.2* 4.2  CL 102 98  CO2 22 24  GLUCOSE 123* 222*  BUN 16 18  CREATININE 1.16 1.38*  CALCIUM 9.0 8.7    PT/INR:  Recent Labs  10/12/14 0610  LABPROT 15.3*  INR 1.20   ABG    Component Value Date/Time   PHART 7.433 10/12/2014 1325   HCO3 23.8 10/12/2014 1325   TCO2 25.0 10/12/2014 1325   ACIDBASEDEF 2.0 09/19/2014 1226   ACIDBASEDEF 2.0 09/19/2014 1226   O2SAT 94.4 10/12/2014 1325   CBG (last 3)   Recent Labs  10/12/14 0709 10/12/14 1210 10/12/14 1614  GLUCAP 143* 105* 177*   CLINICAL DATA: Preoperative evaluation prior to left ventricular assist device  placement  EXAM: CHEST 2 VIEW  COMPARISON: 09/08/2014  FINDINGS: Left AICD in place. Mild prominence of the cardiomediastinal silhouette is reidentified without evidence for overt edema. Lung fields are grossly clear. Right-sided PICC line terminates over the mid SVC. No pleural effusion. No acute osseous finding.  IMPRESSION: No acute abnormality.   Electronically Signed  By: Conchita Paris M.D.  On: 10/11/2014 15:19         *[address] [place], [place] [address]              [phone]  ------------------------------------------------------------------- Transthoracic Echocardiography  Patient:  Andre Tran, Andre Tran MR #:    [id number] Study Date: 10/11/2014 Gender:   M Age:    54 Height:   170.2 cm Weight:   74.4 kg BSA:    1.89 m^2 Pt. Status: Room:    2W25C  ADMITTING  Gilford Raid, MD [place], MD Orion Crook REFERRING  Kerin Ransom K SONOGRAPHER Donata Clay PERFORMING  [facility], Inpatient  cc:  ------------------------------------------------------------------- LV EF: 20%  ------------------------------------------------------------------- Indications:   CHF - 428.0.  ------------------------------------------------------------------- History:  PMH: LVAD scheduled for 10/13/14. 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 Syncope and hypotension. Stroke. Risk factors: Dyslipidemia.  ------------------------------------------------------------------- Study Conclusions  - Left ventricle: The cavity size was moderately dilated. Wall thickness was normal. LV apical false tendon - contrast was not administered, but the apex is well-visualized, no thrombus is noted. Systolic function  was severely reduced. The estimated ejection fraction was 20%. Diffuse hypokinesis. The study is not technically sufficient to allow evaluation of LV diastolic function. - Mitral valve: Structurally normal valve. There was mild functional regurgitation. - Left atrium: The atrium was normal in size. - Right ventricle: The cavity size was moderately dilated. Pacer wire or catheter noted in right ventricle. Systolic function is reduced. Lateral annulus peak S velocity: 8.27 cm/s. - Right atrium: Pacer wire or catheter noted in right atrium. - Atrial septum: Bows from left to right, suggesting high LA pressure. - Tricuspid valve: There was moderate regurgitation. - Pulmonary arteries: PA peak pressure: 44 mm Hg (S). - Inferior vena cava: The vessel was normal in size. The respirophasic diameter changes were in the normal range (>= 50%), consistent with normal central venous pressure.  Impressions:  - Compared to a prior echo on 09/19/2014, there has been no significant change.  ------------------------------------------------------------------- Labs, prior tests, procedures, and surgery: Permanent pacemaker system implantation.  Transthoracic echocardiography. M-mode, complete 2D, spectral Doppler, and color Doppler. Birthdate: Patient birthdate: 04/03/1961. Age: Patient is 54 yr old. Sex: Gender: male. BMI: 25.7 kg/m^2. Blood pressure:   97/58 Patient status: Inpatient. Study date: Study date: 10/11/2014. Study time: 03:09 PM. Location: Bedside.  -------------------------------------------------------------------  ------------------------------------------------------------------- Left ventricle: The cavity size was moderately dilated. Wall thickness was normal. LV apical false tendon - contrast was not administered, but the apex is well-visualized, no thrombus is noted. Systolic function was severely reduced. The estimated ejection fraction  was 20%. Diffuse hypokinesis. The study is not technically sufficient to allow evaluation of LV diastolic function.  ------------------------------------------------------------------- Aortic valve:  Structurally normal valve. Trileaflet. Cusp separation was normal. Doppler: Transvalvular velocity was within the normal range. There was no stenosis. There was no regurgitation.  ------------------------------------------------------------------- Aorta: Aortic root: The aortic root was normal in size. Ascending aorta: The ascending aorta was normal in size.  ------------------------------------------------------------------- Mitral valve:  Structurally normal valve.  Doppler: There was mild functional regurgitation.  Peak gradient (D): 9 mm Hg.  ------------------------------------------------------------------- Left atrium: The atrium was normal in size.  ------------------------------------------------------------------- Atrial septum: Bows from left to right, suggesting high LA pressure.  ------------------------------------------------------------------- Right ventricle: The cavity size was moderately dilated. Pacer wire or catheter noted in right ventricle. Systolic function is reduced.  ------------------------------------------------------------------- Pulmonic valve:  The valve appears to be grossly normal. Doppler: There was no significant regurgitation.  ------------------------------------------------------------------- Tricuspid valve:  Doppler: There was moderate regurgitation.  ------------------------------------------------------------------- Pulmonary artery:  The main pulmonary artery was normal-sized.  ------------------------------------------------------------------- Right atrium: The atrium was normal in size. Pacer wire or catheter noted in right  atrium.  ------------------------------------------------------------------- Pericardium: There was no pericardial effusion.  ------------------------------------------------------------------- Systemic veins: Inferior vena cava: The vessel was normal in size. The respirophasic diameter changes were in the normal range (>= 50%), consistent with normal central venous pressure.  ------------------------------------------------------------------- Measurements  Left ventricle             Value    Reference LV ID, ED, PLAX chordal    (H)   60  mm   43 - 52 LV ID, ES, PLAX chordal    (H)   56  mm  23 - 38 LV fx shortening, PLAX chordal (L)   7   %   >=29 LV PW thickness, ED          6   mm   --------- IVS/LV PW ratio, ED          1.17     <=1.3 Stroke volume, 2D           42  ml   --------- Stroke volume/bsa, 2D         22  ml/m^2 ---------  Ventricular septum           Value    Reference IVS thickness, ED           7   mm   ---------  LVOT                  Value    Reference LVOT ID, S               20  mm   --------- LVOT area               3.14 cm^2  --------- LVOT peak velocity, S         94.2 cm/s  --------- LVOT mean velocity, S         59.8 cm/s  --------- LVOT VTI, S              13.5 cm   --------- LVOT peak gradient, S         4   mm Hg ---------  Aorta                 Value    Reference Aortic root ID, ED           29  mm   ---------  Left atrium              Value    Reference LA ID, A-P, ES             40  mm   --------- LA ID/bsa, A-P             2.12 cm/m^2 <=2.2 LA volume, S              51.9 ml   --------- LA  volume/bsa, S            27.5 ml/m^2 --------- LA volume, ES, 1-p A4C         54.3 ml   --------- LA volume/bsa, ES, 1-p A4C       28.8 ml/m^2 --------- LA volume, ES, 1-p A2C         47.5 ml   --------- LA volume/bsa, ES, 1-p A2C       25.2 ml/m^2 ---------  Mitral valve              Value    Reference Mitral E-wave peak velocity      152  cm/s  --------- Mitral deceleration time    (L)   118  ms   150 - 230 Mitral peak gradient, D        9   mm Hg ---------  Pulmonary arteries           Value    Reference PA pressure, S, DP       (H)   44  mm Hg <=30  Tricuspid valve            Value    Reference Tricuspid regurg  peak velocity     333  cm/s  --------- Tricuspid peak RV-RA gradient     44  mm Hg ---------  Right ventricle            Value    Reference RV s&', lateral, S           8.27 cm/s  ---------  Legend: (L) and (H) mark values outside specified reference range.  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 His echo yesterday shows a moderately dilated RV with moderate systolic dysfunction and moderate TR. Plan Heartmate II LVAD in the AM and will reevaluate the tricuspid valve in the OR with TEE to decide if TV repair is indicated. I discussed the surgical procedure with the patient and his wife including alternatives, benefits and risks including but not limited to bleeding, blood transfusion, infection, stroke, RV dysfunction possibly requiring RVAD support, pump malfunction or thrombosis requiring replacement, organ dysfunction, and death. The patient was discussed at the [facility]  transplant conference and the patient was not felt to be a transplant candidate due to his history of lymphoma and stroke but was felt to be an acceptable candidate for LVAD destination therapy. All of their questions have been answered.  Joylene Igo understands and agrees to proceed.     LOS: 2 days    [name] 10/12/2014

## 2014-10-12 NOTE — Progress Notes (Signed)
Patient ID: Andre Tran, male   DOB: 15-Feb-1961, 54 y.o.   MRN: [id number] Advanced Heart Failure Rounding Note   Subjective:     Feels good. No dyspnea.  On heparin gtt.  Low grade fever to 100.1 yesterday, afebrile today.   Echo (4/2) with EF 20%, no LV thrombus, moderately dilated RV with decreased systolic function, mild MR, moderate TR.   Objective:   Weight Range:  Vital Signs:   Temp:  [98.3 F (36.8 C)-100.1 F (37.8 C)] 98.3 F (36.8 C) (04/03 0700) Pulse Rate:  [82-104] 85 (04/03 1017) Resp:  [18] 18 (04/03 1017) BP: (85-101)/(51-62) 85/51 mmHg (04/03 1017) SpO2:  [100 %] 100 % (04/03 0700) Weight:  [164 lb (74.39 kg)] 164 lb (74.39 kg) (04/03 0700) Last BM Date: 10/10/14  Weight change: Filed Weights   10/10/14 1347 10/11/14 0701 10/12/14 0700  Weight: 165 lb 6.4 oz (75.025 kg) 164 lb 1.6 oz (74.435 kg) 164 lb (74.39 kg)    Intake/Output:   Intake/Output Summary (Last 24 hours) at 10/12/14 1033 Last data filed at 10/12/14 0900  Gross per 24 hour  Intake    600 ml  Output    400 ml  Net    200 ml     Physical Exam: General: Well appearing. No resp difficulty HEENT: normal Neck: supple. JVP ~6 . Carotids 2+ bilat; no bruits. No lymphadenopathy or thryomegaly appreciated. Cor: PMI laterally displaced. Regular rate & rhythm. No rubs, or murmurs.  Lungs: clear Abdomen: soft, nontender, nondistended. No hepatosplenomegaly. No bruits or masses. Good bowel sounds. Extremities: no cyanosis, clubbing, rash, edema. RUE double lumen PICC Neuro: alert & orientedx3, cranial nerves grossly intact. moves all 4 extremities w/o difficulty v for mild R-sided weakness. Affect pleasant  Telemetry: SR 80s  Labs: Basic Metabolic Panel:  Recent Labs Lab 10/10/14 1510 10/11/14 0235 10/12/14 0610  NA 134* 131* 132*  K 4.2 4.3 5.2*  CL 101 100 102  CO2 26 25 22   GLUCOSE 113* 128* 123*  BUN 25* 24* 16  CREATININE 1.39* 1.29 1.16  CALCIUM 9.2 8.7 9.0    Liver  Function Tests:  Recent Labs Lab 10/10/14 1510  AST 19  ALT 16  ALKPHOS 76  BILITOT 1.1  PROT 6.7  ALBUMIN 3.7   No results for input(s): LIPASE, AMYLASE in the last 168 hours. No results for input(s): AMMONIA in the last 168 hours.  CBC:  Recent Labs Lab 10/10/14 1510 10/11/14 0235 10/12/14 0610  WBC 6.8 5.9 7.0  NEUTROABS 4.4  --   --   HGB 9.2* 8.6* 8.5*  HCT 26.9* 25.1* 24.8*  MCV 87.3 87.2 87.9  PLT 274 220 222    Cardiac Enzymes: No results for input(s): CKTOTAL, CKMB, CKMBINDEX, TROPONINI in the last 168 hours.  BNP: BNP (last 3 results)  Recent Labs  09/15/14 1535 09/29/14 1053  BNP 100.8* 115.3*    ProBNP (last 3 results)  Recent Labs  06/12/14 1415 06/18/14 1200  PROBNP 2079.0* 2144.0*      Other results:  Imaging: Dg Chest 2 View  10/11/2014   CLINICAL DATA:  Preoperative evaluation prior to left ventricular assist device placement  EXAM: CHEST  2 VIEW  COMPARISON:  09/08/2014  FINDINGS: Left AICD in place. Mild prominence of the cardiomediastinal silhouette is reidentified without evidence for overt edema. Lung fields are grossly clear. Right-sided PICC line terminates over the mid SVC. No pleural effusion. No acute osseous finding.  IMPRESSION: No acute abnormality.  Electronically Signed   By: Conchita Paris M.D.   On: 10/11/2014 15:19     Medications:     Scheduled Medications: . atorvastatin  20 mg Oral QHS  . chlorhexidine  60 mL Topical Once   And  . [START ON 10/13/2014] chlorhexidine  60 mL Topical Once  . [START ON 10/13/2014] chlorhexidine  60 mL Topical Once  . [START ON 10/13/2014] diazepam  5 mg Oral Once  . digoxin  0.125 mg Oral Daily  . docusate sodium  100 mg Oral BID  . ferrous [id number] C-folic acid  1 capsule Oral TID PC  . glipiZIDE  5 mg Oral QAC breakfast  . insulin aspart  0-5 Units Subcutaneous QHS  . insulin aspart  0-9 Units Subcutaneous TID WC  . insulin glargine  5 Units Subcutaneous QHS  .  isosorbide dinitrate  5 mg Oral BID  . magnesium oxide  400 mg Oral Q breakfast  . mupirocin ointment  1 application Nasal BID  . [START ON 10/13/2014] rifampin  600 mg Oral Once    Infusions: . heparin 800 Units/hr (10/11/14 2134)  . milrinone 0.25 mcg/kg/min (10/11/14 2134)    PRN Medications: albuterol, bisacodyl, HYDROcodone-acetaminophen, temazepam   Assessment:   1. Chronic Systolic Heart Failure on home milrinone 0.25 mcg  2. NICM - Echo (4/2) with EF 20%, no LV thrombus, moderately dilated RV with mildly decreased systolic function, mild MR, moderate TR.  chemo induced adriamycin 3. 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 Non Hodgkins Lymphoma 5. H/O CVA 2008  6. H/O VT/VF --St Jude ICD 2010  7. DMII 8. Anemia    Plan/Discussion:    Echo stable.  Hemoglobin low but stable.  Continues on milrinone gtt and heparin gtt.  Mildly elevated K today, will stop spironolactone for now. Plan for LVAD tomorrow.   Low grade fever overnight to 100.1, afebrile this morning. Uncertain etiology: WBCs not elevated, CXR and UA yesterday both unremarkable.  No diarrhea, no abdominal pain.  Will review with Dr Prescott Gum.  Will get surveillance cultures.   Length of Stay: 2   Loralie Champagne MD 10/12/2014, 10:33 AM  Advanced Heart Failure Team Pager [phone] (M-F; 7a - 4p)  Please contact [facility] for night-coverage after hours (4p -7a ) and weekends on [email or web address]

## 2014-10-12 NOTE — Progress Notes (Signed)
Pt ambulated in hallway 550 ft on room air independently. Pt tolerated activity well. Will continue to monitor.

## 2014-10-12 NOTE — Evaluation (Signed)
Physical Therapy Evaluation Patient Details Name: Andre Tran MRN: [id number] DOB: Dec 13, 1960 Today's Date: 10/12/2014   History of Present Illness  54 y.o. male w/ PMHx significant for chronic systolic CHF and CVA 5427. Admitted for planned LVAD 10/13/2014.  Clinical Impression  Patient is admitted for planned LVAD 10/13/2014. Currently mobilizing at mod I to independent level. SpO2 of 94% on room air, HR elevated to 120 while ambulating. Cues for safety with discussion about post op expectations and progression with physical therapy. Will follow up post-op when ordered by MD.    Follow Up Recommendations Home health PT (update post-op)    Equipment Recommendations  None recommended by PT (update post-op)    Recommendations for Other Services       Precautions / Restrictions Precautions Precautions: None (Update post-op) Restrictions Weight Bearing Restrictions: No Other Position/Activity Restrictions: update post-op      Mobility  Bed Mobility               General bed mobility comments: In chair  Transfers Overall transfer level: Independent                  Ambulation/Gait Ambulation/Gait assistance: Modified independent (Device/Increase time) Ambulation Distance (Feet): 550 Feet Assistive device:  (Pushed IV pole) Gait Pattern/deviations: Step-through pattern   Gait velocity interpretation: at or above normal speed for age/gender General Gait Details: Pushing IV pole. Demonstrates good balance and gait speed with no loss of balance. Educated on awareness and to control speed in congested areas in preparation for post op safety. SpO2 94%, HR elevated to 120, resolving into 90s upon sitting.  Stairs            Wheelchair Mobility    Modified Rankin (Stroke Patients Only)       Balance Overall balance assessment: Modified Independent                                           Pertinent Vitals/Pain Pain Assessment:  No/denies pain    Home Living Family/patient expects to be discharged to:: Private residence Living Arrangements: Spouse/significant other;Children Available Help at Discharge: Family;Available 24 hours/day Type of Home: House Home Access: Stairs to enter Entrance Stairs-Rails: Psychiatric nurse of Steps: 5 Home Layout: Two level;Bed/bath upstairs;Able to live on main level with bedroom/bathroom Home Equipment: Walker - 4 wheels;Cane - single point;Bedside commode;Wheelchair - manual      Prior Function Level of Independence: Independent               Hand Dominance   Dominant Hand: Left    Extremity/Trunk Assessment   Upper Extremity Assessment: Defer to OT evaluation           Lower Extremity Assessment: Overall WFL for tasks assessed         Communication   Communication: No difficulties  Cognition Arousal/Alertness: Awake/alert Behavior During Therapy: WFL for tasks assessed/performed Overall Cognitive Status: Within Functional Limits for tasks assessed                      General Comments General comments (skin integrity, edema, etc.): Discussed role of PT post-op LVAD insertion. Safety with mobility and importance of diligent use and compliance with LVAD system.    Exercises        Assessment/Plan    PT Assessment Patient needs continued PT services  PT Diagnosis Other (  comment) (Deconditioned with pre-op assessment for LVAD)   PT Problem List Decreased knowledge of use of DME;Decreased knowledge of precautions;Cardiopulmonary status limiting activity  PT Treatment Interventions DME instruction;Gait training;Stair training;Functional mobility training;Therapeutic activities;Therapeutic exercise;Balance training;Neuromuscular re-education;Patient/family education;Modalities   PT Goals (Current goals can be found in the Care Plan section) Acute Rehab PT Goals Patient Stated Goal: Go back to work and fishing. PT Goal  Formulation: With patient Time For Goal Achievement: 10/26/14 Potential to Achieve Goals: Good    Frequency Min 3X/week   Barriers to discharge        Co-evaluation               End of Session   Activity Tolerance: Patient tolerated treatment well Patient left: with call bell/phone within reach (sitting EOB with lunch) Nurse Communication: Mobility status         Time: 1152-1204 PT Time Calculation (min) (ACUTE ONLY): 12 min   Charges:   PT Evaluation $Initial PT Evaluation Tier I: 1 Procedure     PT G CodesEllouise Newer 10/12/2014, 2:21 PM Elayne Snare, [place]

## 2014-10-12 NOTE — Progress Notes (Signed)
Anesthesiology Pre-operative Note:  54 year old male with severe  adriamycin induced cardiomyopathy with chronic heart failure on home milrinone schedule for Heartmate II LVAD for destination therapy in AM.  Problems:   1. Adriamycin induced cardiomyopathy, S/P chemotherapy for lymphoma 1993 and 2007.   2.LV thrombus on coumadin, no thrombus noted on last ECHO 10/11/14, apex well visualized. Coumadin discontinued, now undergoning bridging with IV heparin  3. H/O VT/VF with St. Jude AICD placed in 2010.  4. H/O CVA 2008 R. basal ganglia, with failed clot retrival and hemorrhagic conversion  with residual right-sided weakness.  5. Type 2 DM  6. 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 Low grade fever Temp 100.1 yesterday, afebrile today, blood  Cultures ordered.    Studies:  ECHO 10/11/14:  LV: Diffuse hypokinesis, estimated EF 20%. LV end diastolic diameter 60 mm hg. NO LV thrombus noted  Aortic valve: structurally normal appearing, good cuff separation, no AI  Mitral Valve:  Structurally normal, mild functional MR  RV: moderately dilated, systolic function reduced. Lateral annulus peak velocity 8.27 cm/sec.  Tricuspid Valve: Moderate regurgitation.  RH and LH Cath 09/19/14  RA-1  RV- 26/01 PA 36/23 (29) PCW- 3  CO/CI 6.4/3.5 PA sat 68%  Minimal coronary disease  Labs: WBC 7,000, H/H 8.5/24.8 Platelets- 222,000 Na-132 K-5.2 BUN/Cr. 16/1.16 glucose 123 LFTs- OK  CXR: mild cardiomegaly, PICC line and AICD leads OK, lung fields clear  I discussed the anesthetic plan with Mr. Minchey. All questions answered, I explained that he will remain intubated following the surgery until at least POD #1.  Roberts Gaudy, MD

## 2014-10-13 ENCOUNTER — Inpatient Hospital Stay (HOSPITAL_COMMUNITY): Payer: Medicare Other | Admitting: Critical Care Medicine

## 2014-10-13 ENCOUNTER — Inpatient Hospital Stay (HOSPITAL_COMMUNITY): Payer: Medicare Other

## 2014-10-13 ENCOUNTER — Inpatient Hospital Stay (HOSPITAL_COMMUNITY): Payer: Medicare Other | Admitting: Certified Registered Nurse Anesthetist

## 2014-10-13 ENCOUNTER — Encounter (HOSPITAL_COMMUNITY)
Admission: RE | Disposition: A | Discharge status: Home / Self Care | Payer: Self-pay | Source: Ambulatory Visit | Attending: Surgery

## 2014-10-13 ENCOUNTER — Encounter (HOSPITAL_COMMUNITY): Payer: Self-pay | Admitting: Certified Registered Nurse Anesthetist

## 2014-10-13 ENCOUNTER — Encounter (HOSPITAL_COMMUNITY)
Admission: RE | Disposition: A | Discharge status: Home / Self Care | Payer: Medicare Other | Source: Ambulatory Visit | Attending: Surgery

## 2014-10-13 DIAGNOSIS — I5043 Acute on chronic combined systolic (congestive) and diastolic (congestive) heart failure: Secondary | ICD-10-CM | POA: Diagnosis present

## 2014-10-13 DIAGNOSIS — I97618 Postprocedural hemorrhage and hematoma of a circulatory system organ or structure following other circulatory system procedure: Secondary | ICD-10-CM

## 2014-10-13 HISTORY — PX: TEE WITHOUT CARDIOVERSION: SHX5443

## 2014-10-13 HISTORY — PX: INSERTION OF IMPLANTABLE LEFT VENTRICULAR ASSIST DEVICE: SHX5866

## 2014-10-13 HISTORY — PX: EXPLORATION POST OPERATIVE OPEN HEART: SHX5061

## 2014-10-13 LAB — POCT I-STAT 3, ART BLOOD GAS (G3+)
Acid-Base Excess: 1 mmol/L (ref 0.0–2.0)
Acid-base deficit: 2 mmol/L (ref 0.0–2.0)
Acid-base deficit: 3 mmol/L — ABNORMAL HIGH (ref 0.0–2.0)
Bicarbonate: 22.4 mEq/L (ref 20.0–24.0)
Bicarbonate: 24.1 mEq/L — ABNORMAL HIGH (ref 20.0–24.0)
Bicarbonate: 25 mEq/L — ABNORMAL HIGH (ref 20.0–24.0)
Bicarbonate: 24.2 mEq/L — ABNORMAL HIGH (ref 20.0–24.0)
Bicarbonate: 21.4 mEq/L (ref 20.0–24.0)
O2 SAT: 100 %
O2 SAT: 100 %
O2 Saturation: 100 %
O2 Saturation: 100 %
O2 Saturation: 100 %
PCO2 ART: 32 mmHg — AB (ref 35.0–45.0)
PCO2 ART: 34 mmHg — AB (ref 35.0–45.0)
PH ART: 7.43 (ref 7.350–7.450)
PH ART: 7.485 — AB (ref 7.350–7.450)
PH ART: 7.401 (ref 7.350–7.450)
PO2 ART: 420 mmHg — AB (ref 80.0–100.0)
Patient temperature: 35.6
TCO2: 23 mmol/L (ref 0–100)
TCO2: 25 mmol/L (ref 0–100)
TCO2: 26 mmol/L (ref 0–100)
TCO2: 25 mmol/L (ref 0–100)
TCO2: 23 mmol/L (ref 0–100)
pCO2 arterial: 36.2 mmHg (ref 35.0–45.0)
pCO2 arterial: 36.3 mmHg (ref 35.0–45.0)
pCO2 arterial: 39 mmHg (ref 35.0–45.0)
pH, Arterial: 7.399 (ref 7.350–7.450)
pH, Arterial: 7.415 (ref 7.350–7.450)
pO2, Arterial: 373 mmHg — ABNORMAL HIGH (ref 80.0–100.0)
pO2, Arterial: 390 mmHg — ABNORMAL HIGH (ref 80.0–100.0)
pO2, Arterial: 311 mmHg — ABNORMAL HIGH (ref 80.0–100.0)
pO2, Arterial: 200 mmHg — ABNORMAL HIGH (ref 80.0–100.0)

## 2014-10-13 LAB — POCT I-STAT, CHEM 8
BUN: 16 mg/dL (ref 6–23)
BUN: 15 mg/dL (ref 6–23)
BUN: 14 mg/dL (ref 6–23)
BUN: 15 mg/dL (ref 6–23)
BUN: 14 mg/dL (ref 6–23)
CALCIUM ION: 1.18 mmol/L (ref 1.12–1.23)
CHLORIDE: 99 mmol/L (ref 96–112)
CHLORIDE: 94 mmol/L — AB (ref 96–112)
Calcium, Ion: 1.2 mmol/L (ref 1.12–1.23)
Calcium, Ion: 0.92 mmol/L — ABNORMAL LOW (ref 1.12–1.23)
Calcium, Ion: 1.01 mmol/L — ABNORMAL LOW (ref 1.12–1.23)
Calcium, Ion: 1.03 mmol/L — ABNORMAL LOW (ref 1.12–1.23)
Chloride: 97 mmol/L (ref 96–112)
Chloride: 93 mmol/L — ABNORMAL LOW (ref 96–112)
Chloride: 93 mmol/L — ABNORMAL LOW (ref 96–112)
Creatinine, Ser: 1 mg/dL (ref 0.50–1.35)
Creatinine, Ser: 0.9 mg/dL (ref 0.50–1.35)
Creatinine, Ser: 0.8 mg/dL (ref 0.50–1.35)
Creatinine, Ser: 0.8 mg/dL (ref 0.50–1.35)
Creatinine, Ser: 0.9 mg/dL (ref 0.50–1.35)
GLUCOSE: 151 mg/dL — AB (ref 70–99)
GLUCOSE: 145 mg/dL — AB (ref 70–99)
Glucose, Bld: 140 mg/dL — ABNORMAL HIGH (ref 70–99)
Glucose, Bld: 121 mg/dL — ABNORMAL HIGH (ref 70–99)
Glucose, Bld: 151 mg/dL — ABNORMAL HIGH (ref 70–99)
HCT: 22 % — ABNORMAL LOW (ref 39.0–52.0)
HCT: 23 % — ABNORMAL LOW (ref 39.0–52.0)
HCT: 25 % — ABNORMAL LOW (ref 39.0–52.0)
HCT: 24 % — ABNORMAL LOW (ref 39.0–52.0)
HEMATOCRIT: 26 % — AB (ref 39.0–52.0)
HEMOGLOBIN: 8.8 g/dL — AB (ref 13.0–17.0)
HEMOGLOBIN: 8.2 g/dL — AB (ref 13.0–17.0)
Hemoglobin: 7.5 g/dL — ABNORMAL LOW (ref 13.0–17.0)
Hemoglobin: 7.8 g/dL — ABNORMAL LOW (ref 13.0–17.0)
Hemoglobin: 8.5 g/dL — ABNORMAL LOW (ref 13.0–17.0)
POTASSIUM: 4.2 mmol/L (ref 3.5–5.1)
POTASSIUM: 3.8 mmol/L (ref 3.5–5.1)
Potassium: 3.9 mmol/L (ref 3.5–5.1)
Potassium: 4.7 mmol/L (ref 3.5–5.1)
Potassium: 3.5 mmol/L (ref 3.5–5.1)
SODIUM: 131 mmol/L — AB (ref 135–145)
SODIUM: 130 mmol/L — AB (ref 135–145)
SODIUM: 131 mmol/L — AB (ref 135–145)
Sodium: 130 mmol/L — ABNORMAL LOW (ref 135–145)
Sodium: 131 mmol/L — ABNORMAL LOW (ref 135–145)
TCO2: 20 mmol/L (ref 0–100)
TCO2: 21 mmol/L (ref 0–100)
TCO2: 21 mmol/L (ref 0–100)
TCO2: 22 mmol/L (ref 0–100)
TCO2: 22 mmol/L (ref 0–100)

## 2014-10-13 LAB — BLOOD GAS, ARTERIAL
ACID-BASE DEFICIT: 1.9 mmol/L (ref 0.0–2.0)
Acid-Base Excess: 0.1 mmol/L (ref 0.0–2.0)
Bicarbonate: 23.8 mEq/L (ref 20.0–24.0)
Bicarbonate: 22.8 mEq/L (ref 20.0–24.0)
DRAWN BY: 418751
Drawn by: 225631
FIO2: 0.21 %
FIO2: 0.6 %
O2 Saturation: 94.4 %
O2 Saturation: 99.3 %
PEEP: 5 cmH2O
PO2 ART: 228 mmHg — AB (ref 80.0–100.0)
Patient temperature: 98.6
Patient temperature: 98.6
RATE: 12 resp/min
TCO2: 25 mmol/L (ref 0–100)
TCO2: 24.1 mmol/L (ref 0–100)
VT: 500 mL
pCO2 arterial: 36.3 mmHg (ref 35.0–45.0)
pCO2 arterial: 41.6 mmHg (ref 35.0–45.0)
pH, Arterial: 7.433 (ref 7.350–7.450)
pH, Arterial: 7.358 (ref 7.350–7.450)
pO2, Arterial: 72.6 mmHg — ABNORMAL LOW (ref 80.0–100.0)

## 2014-10-13 LAB — CBC
HCT: 27.5 % — ABNORMAL LOW (ref 39.0–52.0)
HCT: 24.2 % — ABNORMAL LOW (ref 39.0–52.0)
HCT: 30.9 % — ABNORMAL LOW (ref 39.0–52.0)
HEMATOCRIT: 20.7 % — AB (ref 39.0–52.0)
HEMOGLOBIN: 7.4 g/dL — AB (ref 13.0–17.0)
HEMOGLOBIN: 8.4 g/dL — AB (ref 13.0–17.0)
HEMOGLOBIN: 10.9 g/dL — AB (ref 13.0–17.0)
Hemoglobin: 9.4 g/dL — ABNORMAL LOW (ref 13.0–17.0)
MCH: 30.2 pg (ref 26.0–34.0)
MCH: 30.5 pg (ref 26.0–34.0)
MCH: 30.1 pg (ref 26.0–34.0)
MCH: 30.2 pg (ref 26.0–34.0)
MCHC: 34.2 g/dL (ref 30.0–36.0)
MCHC: 35.7 g/dL (ref 30.0–36.0)
MCHC: 34.7 g/dL (ref 30.0–36.0)
MCHC: 35.3 g/dL (ref 30.0–36.0)
MCV: 88.4 fL (ref 78.0–100.0)
MCV: 85.2 fL (ref 78.0–100.0)
MCV: 86.7 fL (ref 78.0–100.0)
MCV: 85.6 fL (ref 78.0–100.0)
Platelets: 214 10*3/uL (ref 150–400)
Platelets: 110 10*3/uL — ABNORMAL LOW (ref 150–400)
Platelets: 61 10*3/uL — ABNORMAL LOW (ref 150–400)
Platelets: 59 10*3/uL — ABNORMAL LOW (ref 150–400)
RBC: 3.11 MIL/uL — ABNORMAL LOW (ref 4.22–5.81)
RBC: 2.43 MIL/uL — ABNORMAL LOW (ref 4.22–5.81)
RBC: 2.79 MIL/uL — AB (ref 4.22–5.81)
RBC: 3.61 MIL/uL — ABNORMAL LOW (ref 4.22–5.81)
RDW: 16.3 % — AB (ref 11.5–15.5)
RDW: 14.9 % (ref 11.5–15.5)
RDW: 14.3 % (ref 11.5–15.5)
RDW: 14 % (ref 11.5–15.5)
WBC: 6.6 10*3/uL (ref 4.0–10.5)
WBC: 10 10*3/uL (ref 4.0–10.5)
WBC: 10 10*3/uL (ref 4.0–10.5)
WBC: 9.7 10*3/uL (ref 4.0–10.5)

## 2014-10-13 LAB — PREPARE RBC (CROSSMATCH)

## 2014-10-13 LAB — PROTIME-INR
INR: 1.55 — ABNORMAL HIGH (ref 0.00–1.49)
PROTHROMBIN TIME: 18.7 s — AB (ref 11.6–15.2)

## 2014-10-13 LAB — APTT: aPTT: 41 seconds — ABNORMAL HIGH (ref 24–37)

## 2014-10-13 LAB — POCT I-STAT 4, (NA,K, GLUC, HGB,HCT)
Glucose, Bld: 122 mg/dL — ABNORMAL HIGH (ref 70–99)
HCT: 30 % — ABNORMAL LOW (ref 39.0–52.0)
HEMOGLOBIN: 10.2 g/dL — AB (ref 13.0–17.0)
POTASSIUM: 3 mmol/L — AB (ref 3.5–5.1)
Sodium: 133 mmol/L — ABNORMAL LOW (ref 135–145)

## 2014-10-13 LAB — BASIC METABOLIC PANEL
ANION GAP: 5 (ref 5–15)
Anion gap: 9 (ref 5–15)
BUN: 17 mg/dL (ref 6–23)
BUN: 11 mg/dL (ref 6–23)
CALCIUM: 6.8 mg/dL — AB (ref 8.4–10.5)
CO2: 22 mmol/L (ref 19–32)
CO2: 22 mmol/L (ref 19–32)
CREATININE: 0.88 mg/dL (ref 0.50–1.35)
Calcium: 8.7 mg/dL (ref 8.4–10.5)
Chloride: 98 mmol/L (ref 96–112)
Chloride: 107 mmol/L (ref 96–112)
Creatinine, Ser: 1.3 mg/dL (ref 0.50–1.35)
GFR calc Af Amer: 72 mL/min — ABNORMAL LOW (ref >90)
GFR calc Af Amer: >90 mL/min (ref >90)
GFR calc non Af Amer: 62 mL/min — ABNORMAL LOW (ref >90)
Glucose, Bld: 110 mg/dL — ABNORMAL HIGH (ref 70–99)
Glucose, Bld: 140 mg/dL — ABNORMAL HIGH (ref 70–99)
Potassium: 4.4 mmol/L (ref 3.5–5.1)
Potassium: 3.7 mmol/L (ref 3.5–5.1)
SODIUM: 134 mmol/L — AB (ref 135–145)
Sodium: 129 mmol/L — ABNORMAL LOW (ref 135–145)

## 2014-10-13 LAB — CARBOXYHEMOGLOBIN
CARBOXYHEMOGLOBIN: 1.3 % (ref 0.5–1.5)
Carboxyhemoglobin: 1.6 % — ABNORMAL HIGH (ref 0.5–1.5)
METHEMOGLOBIN: 1.7 % — AB (ref 0.0–1.5)
Methemoglobin: 1.8 % — ABNORMAL HIGH (ref 0.0–1.5)
O2 SAT: 67.2 %
O2 SAT: 68.1 %
TOTAL HEMOGLOBIN: 11.1 g/dL — AB (ref 13.5–18.0)
Total hemoglobin: 7.4 g/dL — ABNORMAL LOW (ref 13.5–18.0)

## 2014-10-13 LAB — FIBRINOGEN

## 2014-10-13 LAB — GLUCOSE, CAPILLARY
Glucose-Capillary: 113 mg/dL — ABNORMAL HIGH (ref 70–99)
Glucose-Capillary: 152 mg/dL — ABNORMAL HIGH (ref 70–99)
Glucose-Capillary: 143 mg/dL — ABNORMAL HIGH (ref 70–99)

## 2014-10-13 LAB — HEMOGLOBIN AND HEMATOCRIT, BLOOD
HCT: 25.7 % — ABNORMAL LOW (ref 39.0–52.0)
Hemoglobin: 9 g/dL — ABNORMAL LOW (ref 13.0–17.0)

## 2014-10-13 LAB — CREATININE, SERUM
Creatinine, Ser: 1.01 mg/dL (ref 0.50–1.35)
GFR calc non Af Amer: 84 mL/min — ABNORMAL LOW (ref >90)

## 2014-10-13 LAB — DIC (DISSEMINATED INTRAVASCULAR COAGULATION) PANEL
D DIMER QUANT: 0.72 ug{FEU}/mL — AB (ref 0.00–0.48)
INR: 2.22 — AB (ref 0.00–1.49)
Platelets: 61 10*3/uL — ABNORMAL LOW (ref 150–400)
SMEAR REVIEW: NONE SEEN

## 2014-10-13 LAB — DIC (DISSEMINATED INTRAVASCULAR COAGULATION)PANEL
Fibrinogen: 144 mg/dL — ABNORMAL LOW (ref 204–475)
Prothrombin Time: 24.8 seconds — ABNORMAL HIGH (ref 11.6–15.2)
aPTT: 61 seconds — ABNORMAL HIGH (ref 24–37)

## 2014-10-13 LAB — PLATELET COUNT: Platelets: 95 10*3/uL — ABNORMAL LOW (ref 150–400)

## 2014-10-13 LAB — HEPARIN LEVEL (UNFRACTIONATED): HEPARIN UNFRACTIONATED: 0.39 [IU]/mL (ref 0.30–0.70)

## 2014-10-13 LAB — MAGNESIUM: Magnesium: 1.6 mg/dL (ref 1.5–2.5)

## 2014-10-13 SURGERY — EXPLORATION POST OPERATIVE OPEN HEART
Anesthesia: General | Site: Chest

## 2014-10-13 SURGERY — INSERTION OF IMPLANTABLE LEFT VENTRICULAR ASSIST DEVICE
Anesthesia: General | Site: Chest

## 2014-10-13 MED ORDER — ASPIRIN 81 MG PO CHEW
324.0000 mg | CHEWABLE_TABLET | Freq: Every day | ORAL | Status: DC
  Filled 2014-10-13: qty 4

## 2014-10-13 MED ORDER — LACTATED RINGERS IV SOLN
INTRAVENOUS | Status: DC | PRN
  Administered 2014-10-13: 07:00:00 via INTRAVENOUS

## 2014-10-13 MED ORDER — LACTATED RINGERS IV SOLN
INTRAVENOUS | Status: DC | PRN
  Administered 2014-10-13 (×2): via INTRAVENOUS

## 2014-10-13 MED ORDER — ALBUMIN HUMAN 5 % IV SOLN
12.5000 g | Freq: Once | INTRAVENOUS | Status: AC
  Administered 2014-10-13 (×2): 12.5 g via INTRAVENOUS

## 2014-10-13 MED ORDER — SODIUM CHLORIDE 0.9 % IV SOLN: Freq: Once | INTRAVENOUS | Status: DC

## 2014-10-13 MED ORDER — MIDAZOLAM HCL 2 MG/2ML IJ SOLN
INTRAMUSCULAR | Status: AC
  Filled 2014-10-13: qty 2

## 2014-10-13 MED ORDER — SODIUM CHLORIDE 0.9 % IJ SOLN
3.0000 mL | Freq: Two times a day (BID) | INTRAMUSCULAR | Status: DC
  Administered 2014-10-15 – 2014-10-16 (×4): 3 mL via INTRAVENOUS

## 2014-10-13 MED ORDER — FENTANYL CITRATE 0.05 MG/ML IJ SOLN
INTRAMUSCULAR | Status: AC
  Filled 2014-10-13: qty 5

## 2014-10-13 MED ORDER — PANTOPRAZOLE SODIUM 40 MG PO TBEC
40.0000 mg | DELAYED_RELEASE_TABLET | Freq: Every day | ORAL | Status: DC
  Administered 2014-10-16 – 2014-10-24 (×9): 40 mg via ORAL
  Filled 2014-10-13 (×9): qty 1

## 2014-10-13 MED ORDER — FENTANYL CITRATE 0.05 MG/ML IJ SOLN
INTRAMUSCULAR | Status: DC | PRN
  Administered 2014-10-13: 50 ug via INTRAVENOUS
  Administered 2014-10-13: 100 ug via INTRAVENOUS
  Administered 2014-10-13 (×2): 50 ug via INTRAVENOUS
  Administered 2014-10-13 (×2): 100 ug via INTRAVENOUS
  Administered 2014-10-13: 150 ug via INTRAVENOUS
  Administered 2014-10-13 (×4): 100 ug via INTRAVENOUS

## 2014-10-13 MED ORDER — MORPHINE SULFATE 2 MG/ML IJ SOLN: 1.0000 mg | INTRAMUSCULAR | Status: DC | PRN

## 2014-10-13 MED ORDER — MAGNESIUM SULFATE 4 GM/100ML IV SOLN
4.0000 g | Freq: Once | INTRAVENOUS | Status: AC
  Administered 2014-10-13: 4 g via INTRAVENOUS
  Filled 2014-10-13: qty 100

## 2014-10-13 MED ORDER — FLUCONAZOLE IN SODIUM CHLORIDE 400-0.9 MG/200ML-% IV SOLN
400.0000 mg | Freq: Once | INTRAVENOUS | Status: AC
  Administered 2014-10-14: 400 mg via INTRAVENOUS
  Filled 2014-10-13: qty 200

## 2014-10-13 MED ORDER — NITROGLYCERIN IN D5W 200-5 MCG/ML-% IV SOLN
0.0000 ug/min | INTRAVENOUS | Status: DC
Start: 2014-10-13 — End: 2014-10-18

## 2014-10-13 MED ORDER — VECURONIUM BROMIDE 10 MG IV SOLR
INTRAVENOUS | Status: AC
  Filled 2014-10-13: qty 20

## 2014-10-13 MED ORDER — SODIUM CHLORIDE 0.9 % IV SOLN
INTRAVENOUS | Status: DC
  Administered 2014-10-13 – 2014-10-14 (×2): via INTRAVENOUS
  Filled 2014-10-13 (×3): qty 2.5

## 2014-10-13 MED ORDER — 0.9 % SODIUM CHLORIDE (POUR BTL) OPTIME
TOPICAL | Status: DC | PRN
  Administered 2014-10-13: 6000 mL

## 2014-10-13 MED ORDER — VECURONIUM BROMIDE 10 MG IV SOLR
INTRAVENOUS | Status: DC | PRN
  Administered 2014-10-13 (×3): 5 mg via INTRAVENOUS
  Administered 2014-10-13: 10 mg via INTRAVENOUS
  Administered 2014-10-13: 5 mg via INTRAVENOUS

## 2014-10-13 MED ORDER — BISACODYL 5 MG PO TBEC
10.0000 mg | DELAYED_RELEASE_TABLET | Freq: Every day | ORAL | Status: DC
  Administered 2014-10-15 – 2014-10-17 (×3): 10 mg via ORAL
  Filled 2014-10-13 (×4): qty 2

## 2014-10-13 MED ORDER — ALBUTEROL SULFATE HFA 108 (90 BASE) MCG/ACT IN AERS
INHALATION_SPRAY | RESPIRATORY_TRACT | Status: AC
  Filled 2014-10-13: qty 6.7

## 2014-10-13 MED ORDER — SODIUM CHLORIDE 0.9 % IV SOLN: INTRAVENOUS | Status: DC

## 2014-10-13 MED ORDER — FAMOTIDINE IN NACL 20-0.9 MG/50ML-% IV SOLN
20.0000 mg | Freq: Two times a day (BID) | INTRAVENOUS | Status: AC
  Administered 2014-10-13 (×2): 20 mg via INTRAVENOUS
  Filled 2014-10-13: qty 50

## 2014-10-13 MED ORDER — SODIUM CHLORIDE 0.9 % IV SOLN
INTRAVENOUS | Status: DC | PRN
  Administered 2014-10-13: 13:00:00 via INTRAVENOUS

## 2014-10-13 MED ORDER — THROMBIN 20000 UNITS EX SOLR
OROMUCOSAL | Status: DC | PRN
  Administered 2014-10-13: 4 mL via TOPICAL

## 2014-10-13 MED ORDER — VANCOMYCIN HCL 1000 MG IV SOLR
1000.0000 mg | INTRAVENOUS | Status: DC
  Filled 2014-10-13: qty 1000

## 2014-10-13 MED ORDER — PHENYLEPHRINE HCL 10 MG/ML IJ SOLN
0.0000 ug/min | INTRAMUSCULAR | Status: DC
  Filled 2014-10-13: qty 2

## 2014-10-13 MED ORDER — MILRINONE IN DEXTROSE 20 MG/100ML IV SOLN
0.1000 ug/kg/min | INTRAVENOUS | Status: DC
  Administered 2014-10-13: 0.5 ug/kg/min via INTRAVENOUS
  Administered 2014-10-13: 18:00:00 via INTRAVENOUS
  Administered 2014-10-14 – 2014-10-15 (×4): 0.5 ug/kg/min via INTRAVENOUS
  Administered 2014-10-15 – 2014-10-18 (×5): 0.3 ug/kg/min via INTRAVENOUS
  Administered 2014-10-19: 0.2 ug/kg/min via INTRAVENOUS
  Filled 2014-10-13 (×12): qty 100

## 2014-10-13 MED ORDER — MIDAZOLAM HCL 5 MG/5ML IJ SOLN
INTRAMUSCULAR | Status: DC | PRN
  Administered 2014-10-13: 5 mg via INTRAVENOUS
  Administered 2014-10-13 (×2): 2 mg via INTRAVENOUS
  Administered 2014-10-13: 1 mg via INTRAVENOUS
  Administered 2014-10-13: 2 mg via INTRAVENOUS
  Administered 2014-10-13: 4 mg via INTRAVENOUS

## 2014-10-13 MED ORDER — ONDANSETRON HCL 4 MG/2ML IJ SOLN: 4.0000 mg | Freq: Four times a day (QID) | INTRAMUSCULAR | Status: DC | PRN

## 2014-10-13 MED ORDER — PROTAMINE SULFATE 10 MG/ML IV SOLN
INTRAVENOUS | Status: DC | PRN
  Administered 2014-10-13: 20 mg via INTRAVENOUS
  Administered 2014-10-13: 140 mg via INTRAVENOUS

## 2014-10-13 MED ORDER — LIDOCAINE HCL (CARDIAC) 20 MG/ML IV SOLN
INTRAVENOUS | Status: AC
  Filled 2014-10-13: qty 5

## 2014-10-13 MED ORDER — LIDOCAINE HCL (CARDIAC) 20 MG/ML IV SOLN
INTRAVENOUS | Status: DC | PRN
  Administered 2014-10-13: 50 mg via INTRAVENOUS
  Administered 2014-10-13: 40 mg via INTRAVENOUS

## 2014-10-13 MED ORDER — LACTATED RINGERS IV SOLN: 500.0000 mL | Freq: Once | INTRAVENOUS | Status: AC | PRN

## 2014-10-13 MED ORDER — NOREPINEPHRINE BITARTRATE 1 MG/ML IV SOLN
0.0000 ug/min | INTRAVENOUS | Status: DC
  Administered 2014-10-13: 3 ug/min via INTRAVENOUS
  Administered 2014-10-15: 0.5 ug/min via INTRAVENOUS
  Filled 2014-10-13 (×3): qty 4

## 2014-10-13 MED ORDER — ACETAMINOPHEN 160 MG/5ML PO SOLN: 650.0000 mg | Freq: Once | ORAL | Status: AC

## 2014-10-13 MED ORDER — ACETAMINOPHEN 500 MG PO TABS
1000.0000 mg | ORAL_TABLET | Freq: Four times a day (QID) | ORAL | Status: AC
  Administered 2014-10-15 – 2014-10-18 (×11): 1000 mg via ORAL
  Filled 2014-10-13 (×18): qty 2

## 2014-10-13 MED ORDER — HEMOSTATIC AGENTS (NO CHARGE) OPTIME
TOPICAL | Status: DC | PRN
  Administered 2014-10-13: 1 via TOPICAL

## 2014-10-13 MED ORDER — SODIUM CHLORIDE 0.45 % IV SOLN
INTRAVENOUS | Status: DC | PRN
  Administered 2014-10-15: 22:00:00 via INTRAVENOUS

## 2014-10-13 MED ORDER — THROMBIN 20000 UNITS EX SOLR
CUTANEOUS | Status: DC | PRN
  Administered 2014-10-13: 20000 [IU] via TOPICAL

## 2014-10-13 MED ORDER — MORPHINE SULFATE 2 MG/ML IJ SOLN
2.0000 mg | INTRAMUSCULAR | Status: DC | PRN
  Administered 2014-10-14 (×3): 2 mg via INTRAVENOUS
  Administered 2014-10-15 (×2): 4 mg via INTRAVENOUS
  Administered 2014-10-15 – 2014-10-17 (×4): 2 mg via INTRAVENOUS
  Filled 2014-10-13: qty 2
  Filled 2014-10-13: qty 1
  Filled 2014-10-13: qty 2
  Filled 2014-10-13 (×3): qty 1
  Filled 2014-10-13: qty 2
  Filled 2014-10-13 (×2): qty 1

## 2014-10-13 MED ORDER — DEXMEDETOMIDINE HCL IN NACL 200 MCG/50ML IV SOLN
0.1000 ug/kg/h | INTRAVENOUS | Status: DC
  Administered 2014-10-13 – 2014-10-14 (×4): 0.7 ug/kg/h via INTRAVENOUS
  Administered 2014-10-14 – 2014-10-15 (×4): 0.4 ug/kg/h via INTRAVENOUS
  Filled 2014-10-13 (×4): qty 50
  Filled 2014-10-13: qty 100
  Filled 2014-10-13: qty 50

## 2014-10-13 MED ORDER — SODIUM CHLORIDE 0.9 % IJ SOLN
INTRAMUSCULAR | Status: AC
  Filled 2014-10-13: qty 20

## 2014-10-13 MED ORDER — ACETAMINOPHEN 160 MG/5ML PO SOLN
1000.0000 mg | Freq: Four times a day (QID) | ORAL | Status: AC
  Administered 2014-10-14 – 2014-10-15 (×5): 1000 mg
  Filled 2014-10-13 (×5): qty 40.6

## 2014-10-13 MED ORDER — SODIUM CHLORIDE 0.9 % IV SOLN
250.0000 mL | INTRAVENOUS | Status: DC
  Administered 2014-10-14: 250 mL via INTRAVENOUS

## 2014-10-13 MED ORDER — LACTATED RINGERS IV SOLN
INTRAVENOUS | Status: DC
  Administered 2014-10-13: 19:00:00 via INTRAVENOUS

## 2014-10-13 MED ORDER — SUCCINYLCHOLINE CHLORIDE 20 MG/ML IJ SOLN
INTRAMUSCULAR | Status: AC
  Filled 2014-10-13: qty 1

## 2014-10-13 MED ORDER — ALBUMIN HUMAN 5 % IV SOLN
INTRAVENOUS | Status: DC | PRN
  Administered 2014-10-13 (×2): via INTRAVENOUS

## 2014-10-13 MED ORDER — ARTIFICIAL TEARS OP OINT
TOPICAL_OINTMENT | OPHTHALMIC | Status: AC
  Filled 2014-10-13: qty 3.5

## 2014-10-13 MED ORDER — LACTATED RINGERS IV SOLN
INTRAVENOUS | Status: DC | PRN
  Administered 2014-10-13 (×3): via INTRAVENOUS

## 2014-10-13 MED ORDER — INSULIN REGULAR BOLUS VIA INFUSION
0.0000 [IU] | Freq: Three times a day (TID) | INTRAVENOUS | Status: DC
  Filled 2014-10-13: qty 10

## 2014-10-13 MED ORDER — THROMBIN 20000 UNITS EX SOLR
CUTANEOUS | Status: AC
  Filled 2014-10-13: qty 20000

## 2014-10-13 MED ORDER — DOCUSATE SODIUM 100 MG PO CAPS
200.0000 mg | ORAL_CAPSULE | Freq: Every day | ORAL | Status: DC
  Administered 2014-10-16 – 2014-10-24 (×9): 200 mg via ORAL
  Filled 2014-10-13 (×9): qty 2

## 2014-10-13 MED ORDER — CALCIUM CHLORIDE 10 % IV SOLN
INTRAVENOUS | Status: DC | PRN
  Administered 2014-10-13: 100 mg via INTRAVENOUS

## 2014-10-13 MED ORDER — THROMBIN 20000 UNITS EX SOLR
OROMUCOSAL | Status: DC | PRN
  Administered 2014-10-13 (×3): via TOPICAL

## 2014-10-13 MED ORDER — BISACODYL 10 MG RE SUPP: 10.0000 mg | Freq: Every day | RECTAL | Status: DC

## 2014-10-13 MED ORDER — EPINEPHRINE HCL 1 MG/ML IJ SOLN
2.0000 ug/min | INTRAVENOUS | Status: DC
  Administered 2014-10-13: 2 ug/min via INTRAVENOUS
  Administered 2014-10-14 – 2014-10-15 (×2): 4 ug/min via INTRAVENOUS
  Filled 2014-10-13 (×4): qty 4

## 2014-10-13 MED ORDER — CETYLPYRIDINIUM CHLORIDE 0.05 % MT LIQD
7.0000 mL | Freq: Four times a day (QID) | OROMUCOSAL | Status: DC
  Administered 2014-10-14 – 2014-10-17 (×14): 7 mL via OROMUCOSAL

## 2014-10-13 MED ORDER — PROTAMINE SULFATE 10 MG/ML IV SOLN
INTRAVENOUS | Status: AC
  Filled 2014-10-13: qty 25

## 2014-10-13 MED ORDER — SODIUM CHLORIDE 0.9 % IJ SOLN: 3.0000 mL | INTRAMUSCULAR | Status: DC | PRN

## 2014-10-13 MED ORDER — CHLORHEXIDINE GLUCONATE 0.12 % MT SOLN
15.0000 mL | Freq: Two times a day (BID) | OROMUCOSAL | Status: DC
Start: 2014-10-13 — End: 2014-10-16
  Administered 2014-10-13 – 2014-10-15 (×5): 15 mL via OROMUCOSAL
  Filled 2014-10-13 (×5): qty 15

## 2014-10-13 MED ORDER — POTASSIUM CHLORIDE 10 MEQ/50ML IV SOLN
10.0000 meq | Freq: Once | INTRAVENOUS | Status: AC
  Administered 2014-10-13: 10 meq via INTRAVENOUS

## 2014-10-13 MED ORDER — VECURONIUM BROMIDE 10 MG IV SOLR
INTRAVENOUS | Status: DC | PRN
  Administered 2014-10-13: 5 mg via INTRAVENOUS

## 2014-10-13 MED ORDER — RIFAMPIN 300 MG PO CAPS
600.0000 mg | ORAL_CAPSULE | Freq: Once | ORAL | Status: AC
  Administered 2014-10-14: 600 mg via ORAL
  Filled 2014-10-13: qty 2

## 2014-10-13 MED ORDER — ETOMIDATE 2 MG/ML IV SOLN
INTRAVENOUS | Status: AC
  Filled 2014-10-13: qty 10

## 2014-10-13 MED ORDER — DEXTROSE 5 % IV SOLN
1.5000 g | Freq: Two times a day (BID) | INTRAVENOUS | Status: AC
  Administered 2014-10-13 – 2014-10-15 (×4): 1.5 g via INTRAVENOUS
  Filled 2014-10-13 (×4): qty 1.5

## 2014-10-13 MED ORDER — HEPARIN SODIUM (PORCINE) 1000 UNIT/ML IJ SOLN
INTRAMUSCULAR | Status: DC | PRN
  Administered 2014-10-13 (×2): 10000 [IU] via INTRAVENOUS

## 2014-10-13 MED ORDER — VANCOMYCIN HCL IN DEXTROSE 1-5 GM/200ML-% IV SOLN
1000.0000 mg | Freq: Two times a day (BID) | INTRAVENOUS | Status: AC
  Administered 2014-10-13 – 2014-10-14 (×3): 1000 mg via INTRAVENOUS
  Filled 2014-10-13 (×3): qty 200

## 2014-10-13 MED ORDER — ALBUMIN HUMAN 5 % IV SOLN
INTRAVENOUS | Status: AC
  Administered 2014-10-13: 12.5 g via INTRAVENOUS
  Filled 2014-10-13: qty 500

## 2014-10-13 MED ORDER — ACETAMINOPHEN 650 MG RE SUPP
650.0000 mg | Freq: Once | RECTAL | Status: AC
  Administered 2014-10-13: 650 mg via RECTAL

## 2014-10-13 MED ORDER — ETOMIDATE 2 MG/ML IV SOLN
INTRAVENOUS | Status: DC | PRN
  Administered 2014-10-13: 14 mg via INTRAVENOUS

## 2014-10-13 MED ORDER — LACTATED RINGERS IV SOLN
INTRAVENOUS | Status: DC | PRN
  Administered 2014-10-13 (×2): via INTRAVENOUS

## 2014-10-13 MED ORDER — MIDAZOLAM HCL 10 MG/2ML IJ SOLN
INTRAMUSCULAR | Status: AC
  Filled 2014-10-13: qty 2

## 2014-10-13 MED ORDER — PROTAMINE SULFATE 10 MG/ML IV SOLN
INTRAVENOUS | Status: AC
  Filled 2014-10-13: qty 10

## 2014-10-13 MED ORDER — OXYCODONE HCL 5 MG PO TABS
5.0000 mg | ORAL_TABLET | ORAL | Status: DC | PRN
  Administered 2014-10-15 – 2014-10-21 (×5): 10 mg via ORAL
  Administered 2014-10-23: 5 mg via ORAL
  Filled 2014-10-13: qty 1
  Filled 2014-10-13 (×5): qty 2

## 2014-10-13 MED ORDER — MIDAZOLAM HCL 2 MG/2ML IJ SOLN
INTRAMUSCULAR | Status: DC | PRN
  Administered 2014-10-13: 2 mg via INTRAVENOUS

## 2014-10-13 MED ORDER — TRAMADOL HCL 50 MG PO TABS
50.0000 mg | ORAL_TABLET | ORAL | Status: DC | PRN
  Administered 2014-10-19 – 2014-10-21 (×3): 100 mg via ORAL
  Administered 2014-10-24: 50 mg via ORAL
  Filled 2014-10-13: qty 2
  Filled 2014-10-13: qty 1
  Filled 2014-10-13 (×2): qty 2

## 2014-10-13 MED ORDER — MIDAZOLAM HCL 2 MG/2ML IJ SOLN
2.0000 mg | INTRAMUSCULAR | Status: DC | PRN
  Administered 2014-10-14: 2 mg via INTRAVENOUS
  Filled 2014-10-13 (×2): qty 2

## 2014-10-13 MED ORDER — LACTATED RINGERS IV SOLN
INTRAVENOUS | Status: DC
  Administered 2014-10-13 – 2014-10-17 (×3): via INTRAVENOUS

## 2014-10-13 MED ORDER — ASPIRIN EC 325 MG PO TBEC
325.0000 mg | DELAYED_RELEASE_TABLET | Freq: Every day | ORAL | Status: DC
  Filled 2014-10-13 (×2): qty 1

## 2014-10-13 MED ORDER — ASPIRIN 300 MG RE SUPP
300.0000 mg | Freq: Every day | RECTAL | Status: DC
  Filled 2014-10-13 (×2): qty 1

## 2014-10-13 MED ORDER — STERILE WATER FOR INJECTION IJ SOLN
INTRAMUSCULAR | Status: AC
  Filled 2014-10-13: qty 20

## 2014-10-13 MED ORDER — SODIUM CHLORIDE 0.9 % IV SOLN
Freq: Once | INTRAVENOUS | Status: AC
  Administered 2014-10-13: 20:00:00 via INTRAVENOUS

## 2014-10-13 MED ORDER — POTASSIUM CHLORIDE 10 MEQ/50ML IV SOLN
10.0000 meq | INTRAVENOUS | Status: AC
  Administered 2014-10-13 (×2): 10 meq via INTRAVENOUS

## 2014-10-13 MED ORDER — ALBUMIN HUMAN 5 % IV SOLN
250.0000 mL | INTRAVENOUS | Status: AC | PRN
  Administered 2014-10-13 (×4): 250 mL via INTRAVENOUS
  Filled 2014-10-13 (×2): qty 250

## 2014-10-13 MED ORDER — NEOSTIGMINE METHYLSULFATE 10 MG/10ML IV SOLN
INTRAVENOUS | Status: AC
  Filled 2014-10-13: qty 2

## 2014-10-13 SURGICAL SUPPLY — 115 items
ADAPTER CARDIO PERF ANTE/RETRO (ADAPTER) IMPLANT
ADAPTER DLP PERFUSION .25INX2I (MISCELLANEOUS) ×4 IMPLANT
ADH SKN CLS APL DERMABOND .7 (GAUZE/BANDAGES/DRESSINGS) ×2
ADPR CRDPLG .25X.64 STRL (MISCELLANEOUS) ×2
ADPR PRFSN 84XANTGRD RTRGD (ADAPTER)
APL SRG 7X2 LUM MLBL SLNT (VASCULAR PRODUCTS) ×4
APPLICATOR CHLORAPREP 3ML ORNG (MISCELLANEOUS) ×4 IMPLANT
APPLICATOR TIP COSEAL (VASCULAR PRODUCTS) ×4 IMPLANT
ATTRACTOMAT 16X20 MAGNETIC DRP (DRAPES) ×2 IMPLANT
BAG DECANTER FOR FLEXI CONT (MISCELLANEOUS) ×12 IMPLANT
BLADE STERNUM SYSTEM 6 (BLADE) ×4 IMPLANT
BLADE SURG 10 STRL SS (BLADE) IMPLANT
BLADE SURG 12 STRL SS (BLADE) ×2 IMPLANT
BLADE SURG 15 STRL LF DISP TIS (BLADE) IMPLANT
BLADE SURG 15 STRL SS (BLADE)
CANISTER SUCTION 2500CC (MISCELLANEOUS) ×4 IMPLANT
CANNULA ARTERIAL NVNT 3/8 20FR (MISCELLANEOUS) ×2 IMPLANT
CANNULA VENOUS LOW PROF 34X46 (CANNULA) ×2 IMPLANT
CATH CPB KIT VANTRIGT (MISCELLANEOUS) ×2 IMPLANT
CATH FOLEY 2WAY SLVR  5CC 14FR (CATHETERS) ×2
CATH FOLEY 2WAY SLVR 5CC 14FR (CATHETERS) ×2 IMPLANT
CATH HEART VENT LEFT (CATHETERS) IMPLANT
CATH HYDRAGLIDE XL THORACIC (CATHETERS) ×4 IMPLANT
CATH ROBINSON RED A/P 18FR (CATHETERS) ×8 IMPLANT
CATH THORACIC 28FR (CATHETERS) ×4 IMPLANT
CATH THORACIC 36FR (CATHETERS) ×2 IMPLANT
CATH THORACIC 36FR RT ANG (CATHETERS) ×2 IMPLANT
CHLORAPREP W/TINT 26ML (MISCELLANEOUS) ×4 IMPLANT
CONN ST 1/4X3/8  BEN (MISCELLANEOUS)
CONN ST 1/4X3/8 BEN (MISCELLANEOUS) IMPLANT
CONN Y 3/8X3/8X3/8  BEN (MISCELLANEOUS) ×2
CONN Y 3/8X3/8X3/8 BEN (MISCELLANEOUS) IMPLANT
CONT SPEC 4OZ CLIKSEAL STRL BL (MISCELLANEOUS) ×2 IMPLANT
COVER BACK TABLE 24X17X13 BIG (DRAPES) ×2 IMPLANT
COVER SURGICAL LIGHT HANDLE (MISCELLANEOUS) ×4 IMPLANT
CRADLE DONUT ADULT HEAD (MISCELLANEOUS) ×4 IMPLANT
DERMABOND ADVANCED (GAUZE/BANDAGES/DRESSINGS) ×2
DERMABOND ADVANCED .7 DNX12 (GAUZE/BANDAGES/DRESSINGS) IMPLANT
DRAIN CHANNEL 28F RND 3/8 FF (WOUND CARE) IMPLANT
DRAIN CHANNEL 32F RND 10.7 FF (WOUND CARE) IMPLANT
DRAPE BILATERAL SPLIT (DRAPES) ×4 IMPLANT
DRAPE INCISE IOBAN 66X45 STRL (DRAPES) ×2 IMPLANT
DRAPE SLUSH/WARMER DISC (DRAPES) ×4 IMPLANT
DRSG AQUACEL AG ADV 3.5X14 (GAUZE/BANDAGES/DRESSINGS) ×2 IMPLANT
DRSG COVADERM 4X14 (GAUZE/BANDAGES/DRESSINGS) ×4 IMPLANT
ELECT BLADE 4.0 EZ CLEAN MEGAD (MISCELLANEOUS) ×4
ELECT BLADE 6.5 EXT (BLADE) ×4 IMPLANT
ELECT CAUTERY BLADE 6.4 (BLADE) ×4 IMPLANT
ELECT PAD GROUND ADT 9 (MISCELLANEOUS) ×4 IMPLANT
ELECT REM PT RETURN 9FT ADLT (ELECTROSURGICAL) ×8
ELECTRODE BLDE 4.0 EZ CLN MEGD (MISCELLANEOUS) ×2 IMPLANT
ELECTRODE REM PT RTRN 9FT ADLT (ELECTROSURGICAL) ×2 IMPLANT
FELT TEFLON 6X6 (MISCELLANEOUS) ×4 IMPLANT
GAUZE SPONGE 4X4 12PLY STRL (GAUZE/BANDAGES/DRESSINGS) ×8 IMPLANT
GLOVE BIOGEL M 6.5 STRL (GLOVE) ×4 IMPLANT
GOWN STRL REUS W/ TWL LRG LVL3 (GOWN DISPOSABLE) ×8 IMPLANT
GOWN STRL REUS W/ TWL XL LVL3 (GOWN DISPOSABLE) ×4 IMPLANT
GOWN STRL REUS W/TWL LRG LVL3 (GOWN DISPOSABLE) ×28
GOWN STRL REUS W/TWL XL LVL3 (GOWN DISPOSABLE) ×8
HEMOSTAT POWDER SURGIFOAM 1G (HEMOSTASIS) ×14 IMPLANT
HEMOSTAT SURGICEL 2X14 (HEMOSTASIS) ×4 IMPLANT
INSERT FOGARTY XLG (MISCELLANEOUS) IMPLANT
KIT BASIN OR (CUSTOM PROCEDURE TRAY) ×4 IMPLANT
KIT HM II VENT ASSIST DEV BP (Prosthesis & Implant Heart) ×2 IMPLANT
KIT ROOM TURNOVER OR (KITS) ×4 IMPLANT
KIT SUCTION CATH 14FR (SUCTIONS) ×4 IMPLANT
LEAD PACING MYOCARDI (MISCELLANEOUS) ×2 IMPLANT
NS IRRIG 1000ML POUR BTL (IV SOLUTION) ×22 IMPLANT
PACK OPEN HEART (CUSTOM PROCEDURE TRAY) ×4 IMPLANT
PAD ARMBOARD 7.5X6 YLW CONV (MISCELLANEOUS) ×8 IMPLANT
PAD DEFIB R2 (MISCELLANEOUS) ×4 IMPLANT
PUNCH AORTIC ROTATE 4.5MM 8IN (MISCELLANEOUS) ×4 IMPLANT
SEALANT SURG COSEAL 8ML (VASCULAR PRODUCTS) ×4 IMPLANT
SHEATH AVANTI 11CM 5FR (MISCELLANEOUS) IMPLANT
SPONGE GAUZE 4X4 12PLY STER LF (GAUZE/BANDAGES/DRESSINGS) ×4 IMPLANT
SPONGE LAP 18X18 X RAY DECT (DISPOSABLE) ×2 IMPLANT
SUCKER INTRACARDIAC WEIGHTED (SUCKER) ×4 IMPLANT
SUT ETHIBOND 2 0 SH (SUTURE) ×20
SUT ETHIBOND 2 0 SH 36X2 (SUTURE) ×10 IMPLANT
SUT ETHIBOND 5 LR DA (SUTURE) ×8 IMPLANT
SUT ETHIBOND NAB MH 2-0 36IN (SUTURE) ×56 IMPLANT
SUT PROLENE 3 0 SH DA (SUTURE) ×6 IMPLANT
SUT PROLENE 4 0 RB 1 (SUTURE) ×20
SUT PROLENE 4-0 RB1 .5 CRCL 36 (SUTURE) ×8 IMPLANT
SUT PROLENE 5 0 C 1 36 (SUTURE) ×4 IMPLANT
SUT PROLENE 5 0 C1 (SUTURE) IMPLANT
SUT PROLENE 6 0 C 1 30 (SUTURE) ×8 IMPLANT
SUT SILK  1 MH (SUTURE) ×16
SUT SILK 1 MH (SUTURE) ×8 IMPLANT
SUT SILK 1 TIES 10X30 (SUTURE) ×4 IMPLANT
SUT SILK 2 0 SH CR/8 (SUTURE) ×8 IMPLANT
SUT STEEL 6MS V (SUTURE) ×8 IMPLANT
SUT STEEL SZ 6 DBL 3X14 BALL (SUTURE) ×2 IMPLANT
SUT TEM PAC WIRE 2 0 SH (SUTURE) ×8 IMPLANT
SUT VIC AB 1 CTX 18 (SUTURE) ×2 IMPLANT
SUT VIC AB 1 CTX 36 (SUTURE) ×8
SUT VIC AB 1 CTX36XBRD ANBCTR (SUTURE) ×4 IMPLANT
SUT VIC AB 2-0 CT1 27 (SUTURE) ×4
SUT VIC AB 2-0 CT1 TAPERPNT 27 (SUTURE) IMPLANT
SUT VIC AB 2-0 CTX 27 (SUTURE) ×8 IMPLANT
SUT VIC AB 3-0 SH 8-18 (SUTURE) ×2 IMPLANT
SUT VIC AB 3-0 X1 27 (SUTURE) ×10 IMPLANT
SUT VICRYL 2 TP 1 (SUTURE) ×2 IMPLANT
SYR 50ML LL SCALE MARK (SYRINGE) ×2 IMPLANT
SYSTEM SAHARA CHEST DRAIN ATS (WOUND CARE) ×6 IMPLANT
TAPE CLOTH SURG 4X10 WHT LF (GAUZE/BANDAGES/DRESSINGS) ×4 IMPLANT
TOWEL OR 17X26 10 PK STRL BLUE (TOWEL DISPOSABLE) ×8 IMPLANT
TRAY CATH LUMEN 1 20CM STRL (SET/KITS/TRAYS/PACK) ×2 IMPLANT
TRAY FOLEY IC TEMP SENS 14FR (CATHETERS) ×4 IMPLANT
TUBE CONNECTING 12'X1/4 (SUCTIONS) ×1
TUBE CONNECTING 12X1/4 (SUCTIONS) ×3 IMPLANT
UNDERPAD 30X30 INCONTINENT (UNDERPADS AND DIAPERS) ×4 IMPLANT
VENT LEFT HEART 12002 (CATHETERS)
WATER STERILE IRR 1000ML POUR (IV SOLUTION) ×8 IMPLANT
YANKAUER SUCT BULB TIP NO VENT (SUCTIONS) ×2 IMPLANT

## 2014-10-13 SURGICAL SUPPLY — 71 items
ATTRACTOMAT 16X20 MAGNETIC DRP (DRAPES) ×1 IMPLANT
BAG DECANTER FOR FLEXI CONT (MISCELLANEOUS) ×2 IMPLANT
CANISTER SUCTION 2500CC (MISCELLANEOUS) ×2 IMPLANT
CATH ROBINSON RED A/P 18FR (CATHETERS) IMPLANT
COVER SURGICAL LIGHT HANDLE (MISCELLANEOUS) ×3 IMPLANT
CRADLE DONUT ADULT HEAD (MISCELLANEOUS) ×2 IMPLANT
DRAPE CARDIOVASCULAR INCISE (DRAPES) ×2
DRAPE SLUSH/WARMER DISC (DRAPES) ×1 IMPLANT
DRAPE SRG 135X102X78XABS (DRAPES) ×1 IMPLANT
DRSG AQUACEL AG ADV 3.5X14 (GAUZE/BANDAGES/DRESSINGS) ×1 IMPLANT
DRSG COVADERM 4X14 (GAUZE/BANDAGES/DRESSINGS) ×2 IMPLANT
ELECT BLADE 4.0 EZ CLEAN MEGAD (MISCELLANEOUS) ×2
ELECT CAUTERY BLADE 6.4 (BLADE) ×2 IMPLANT
ELECT REM PT RETURN 9FT ADLT (ELECTROSURGICAL) ×2
ELECTRODE BLDE 4.0 EZ CLN MEGD (MISCELLANEOUS) IMPLANT
ELECTRODE REM PT RTRN 9FT ADLT (ELECTROSURGICAL) ×2 IMPLANT
GAUZE SPONGE 4X4 12PLY STRL (GAUZE/BANDAGES/DRESSINGS) ×3 IMPLANT
GLOVE BIO SURGEON STRL SZ 6 (GLOVE) IMPLANT
GLOVE BIO SURGEON STRL SZ 6.5 (GLOVE) IMPLANT
GLOVE BIO SURGEON STRL SZ7 (GLOVE) IMPLANT
GLOVE BIO SURGEON STRL SZ7.5 (GLOVE) IMPLANT
GLOVE BIOGEL PI IND STRL 7.0 (GLOVE) IMPLANT
GLOVE BIOGEL PI INDICATOR 7.0 (GLOVE)
GLOVE EUDERMIC 7 POWDERFREE (GLOVE) ×4 IMPLANT
GOWN STRL REUS W/ TWL LRG LVL3 (GOWN DISPOSABLE) ×4 IMPLANT
GOWN STRL REUS W/ TWL XL LVL3 (GOWN DISPOSABLE) ×1 IMPLANT
GOWN STRL REUS W/TWL LRG LVL3 (GOWN DISPOSABLE) ×8
GOWN STRL REUS W/TWL XL LVL3 (GOWN DISPOSABLE) ×2
HEMOSTAT POWDER SURGIFOAM 1G (HEMOSTASIS) ×6 IMPLANT
HEMOSTAT SURGICEL 2X14 (HEMOSTASIS) ×2 IMPLANT
KIT BASIN OR (CUSTOM PROCEDURE TRAY) ×2 IMPLANT
KIT CATH CPB BARTLE (MISCELLANEOUS) IMPLANT
KIT CATH SUCT 8FR (CATHETERS) ×1 IMPLANT
KIT ROOM TURNOVER OR (KITS) ×2 IMPLANT
KIT SUCTION CATH 14FR (SUCTIONS) ×2 IMPLANT
NS IRRIG 1000ML POUR BTL (IV SOLUTION) ×10 IMPLANT
PACK OPEN HEART (CUSTOM PROCEDURE TRAY) ×2 IMPLANT
PAD ARMBOARD 7.5X6 YLW CONV (MISCELLANEOUS) ×4 IMPLANT
PAD ELECT DEFIB RADIOL ZOLL (MISCELLANEOUS) ×2 IMPLANT
PENCIL BUTTON HOLSTER BLD 10FT (ELECTRODE) IMPLANT
SPONGE GAUZE 4X4 12PLY STER LF (GAUZE/BANDAGES/DRESSINGS) ×1 IMPLANT
SPONGE LAP 4X18 X RAY DECT (DISPOSABLE) IMPLANT
SUT MNCRL AB 4-0 PS2 18 (SUTURE) IMPLANT
SUT PROLENE 3 0 SH DA (SUTURE) ×6 IMPLANT
SUT PROLENE 3 0 SH1 36 (SUTURE) IMPLANT
SUT PROLENE 4 0 RB 1 (SUTURE)
SUT PROLENE 4-0 RB1 .5 CRCL 36 (SUTURE) IMPLANT
SUT PROLENE 5 0 C 1 36 (SUTURE) IMPLANT
SUT PROLENE 6 0 C 1 30 (SUTURE) IMPLANT
SUT PROLENE 7 0 BV 1 (SUTURE) IMPLANT
SUT PROLENE 7 0 BV1 MDA (SUTURE) IMPLANT
SUT PROLENE 8 0 BV175 6 (SUTURE) IMPLANT
SUT STEEL 6MS V (SUTURE) ×2 IMPLANT
SUT STEEL STERNAL CCS#1 18IN (SUTURE) IMPLANT
SUT STEEL SZ 6 DBL 3X14 BALL (SUTURE) IMPLANT
SUT VIC AB 1 CTX 18 (SUTURE) ×1 IMPLANT
SUT VIC AB 1 CTX 36 (SUTURE) ×12
SUT VIC AB 1 CTX36XBRD ANBCTR (SUTURE) ×2 IMPLANT
SUT VIC AB 2-0 CTX 27 (SUTURE) ×2 IMPLANT
SUT VIC AB 3-0 SH 27 (SUTURE)
SUT VIC AB 3-0 SH 27X BRD (SUTURE) IMPLANT
SUT VIC AB 3-0 X1 27 (SUTURE) ×2 IMPLANT
SUT VICRYL 4-0 PS2 18IN ABS (SUTURE) IMPLANT
SUTURE E-PAK OPEN HEART (SUTURE) ×2 IMPLANT
SYSTEM SAHARA CHEST DRAIN ATS (WOUND CARE) IMPLANT
TAPE CLOTH SURG 4X10 WHT LF (GAUZE/BANDAGES/DRESSINGS) ×1 IMPLANT
TOWEL OR 17X24 6PK STRL BLUE (TOWEL DISPOSABLE) ×4 IMPLANT
TOWEL OR 17X26 10 PK STRL BLUE (TOWEL DISPOSABLE) ×4 IMPLANT
TUBING BULK SUCTION (MISCELLANEOUS) ×1 IMPLANT
UNDERPAD 30X30 INCONTINENT (UNDERPADS AND DIAPERS) ×2 IMPLANT
WATER STERILE IRR 1000ML POUR (IV SOLUTION) ×4 IMPLANT

## 2014-10-13 NOTE — Transfer of Care (Signed)
Immediate Anesthesia Transfer of Care Note  Patient: Andre Tran  Procedure(s) Performed: Procedure(s): EXPLORATION POST OPERATIVE OPEN HEART (N/A)  Patient Location: SICU  Anesthesia Type:General  Level of Consciousness: sedated and Patient remains intubated per anesthesia plan  Airway & Oxygen Therapy: Patient remains intubated per anesthesia plan and Patient placed on Ventilator (see vital sign flow sheet for setting)  Post-op Assessment: Report given to RN  Post vital signs: Reviewed and stable  Last Vitals:  Filed Vitals:   10/13/14 1545  BP:   Pulse:   Temp:   Resp: 17    Complications: No apparent anesthesia complications

## 2014-10-13 NOTE — Progress Notes (Signed)
Andre Tran has been discussed with the VAD Medical Review board on 09/29/14. The team feels as if the patient is a good candidate for Destination LVAD therapy. The patient meets criteria for a LVAD implant as listed below:  1) Has failed to respond to optimal medical management (including beta-blockers and ACE inhibitors if tolerated) for  at least 45 of the last 60 days, or have been balloon dependent for 7 days, or IV inotrope dependent for 14 days; and,   *On Inotropes Milrinone 0.25 mcg/kg/min started 06/23/14  2) Has a left ventricular ejection fraction (LVEF) < 25% and, have demonstrated functional limitation with a peakoxygen consumption of <14 ml/kg/min unless balloon pump or inotrope dependent or physically unable to perform the test.    *EF 20 - 25 % By echo on 09/19/14    *CPX (date) pVO2 ____ RER_____ VE/VCo2 slope_____ *unable to complete d/t milrinone  3) Social work and palliative care evaluations demonstrate appropriate support system in place for discharge to home with a VAD and that end of life discussions have taken place. Both services have expressed no concern regarding Andre Tran's candidacy (see Shon Millet note for further details).    *Social work consult (date): 07/07/14   *Palliative Care Consult (date): 09/17/14  4) Primary caretaker identified that can be taught along with the patient how to manage   the VAD equipment.   *Name: Andre Tran (wife)  5) Deemed appropriate by our financial coordinator: 09/29/14   Prior approval: No prior-authorization required  6) VAD Coordinators, [place] have met with patient and caregiver on several different occasions, shown them the VAD equipment and discussed with the patient and caregiver about lifestyle changes necessary for success on mechanical circulatory device.   *Met with Andre Tran and his wife on 06/21/15  *Consent  for VAD Evaluation/Caregiver Agreement/HIPPA Release/Photo Release signed on 06/21/15  7) He was discussed for Heart Transplant candidacy with [facility] on 09/25/14 by Dr. Prescott Gum and was felt that due to his stroke and history of lymphoma that he was too high risk for transplant but would be a good LVAD candidate.    8) Intermacs profile: 3  INTERMACS 1: Critical cardiogenic shock describes a patient who is "crashing and burning", in which a patient has life-threatening hypotension and rapidly escalating inotropic pressor support, with critical organ hypoperfusion often confirmed by worsening acidosis and lactate levels.  INTERMACS 2: Progressive decline describes a patient who has been demonstrated "dependent" on inotropic support but nonetheless shows signs of continuing deterioration in nutrition, renal function, fluid retention, or other major status indicator. Patient profile 2 can also describe a patient with refractory volume overload, perhaps with evidence of impaired perfusion, in whom inotropic infusions cannot be maintained due to tachyarrhythmias, clinical ischemia, or other intolerance.  INTERMACS 3: Stable but inotrope dependent describes a patient who is clinically stable on mild-moderate doses of intravenous inotropes (or has a temporary circulatory support device) after repeated documentation of failure to wean without symptomatic hypotension, worsening symptoms, or progressive organ dysfunction (usually renal). It is critical to monitor nutrition, renal function, fluid balance, and overall status carefully in order to distinguish between a  patient who is truly stable at Patient Profile 3 and a patient who has unappreciated decline rendering them Patient Profile 2. This patient may be either at home or in the hospital.   INTERMACS 4: Resting symptoms describes a patient who is at home on oral therapy but frequently  has symptoms of congestion at rest or  with activities of daily living (ADL). He or she may have orthopnea, shortness of breath during ADL such as dressing or bathing, gastrointestinal symptoms (abdominal discomfort, nausea, poor appetite), disabling ascites or severe lower extremity edema. This patient should be carefully considered for more intensive management and surveillance programs, which may in some cases, reveal poor compliance that would compromise outcomes with any therapy.  .  INTERMACS 5: Exertion Intolerant describes a patient who is comfortable at rest but unable to engage in any activity, living predominantly within the house or housebound. This patient has no congestive symptoms, but may have chronically elevated volume status, frequently with renal dysfunction, and may be characterized as exercise intolerant.   INTERMACS 6: Exertion Limited also describes a patient who is comfortable at rest without evidence of fluid overload, but who is able to do some mild activity. Activities of daily living are comfortable and minor activities outside the home such as visiting friends or going to a restaurant can be performed, but fatigue results within a few minutes of any meaningful physical exertion. This patient has occasional episodes of worsening symptoms and is likely to have had a hospitalization for heart failure within the past year.   INTERMACS 7: Advanced NYHA Class 3 describes a patient who is clinically stable with a reasonable level of comfortable activity, despite history of previous decompensation that is not recent. This patient is usually able to walk more than a block. Any decompensation requiring intravenous diuretics or hospitalization within the previous month should make this person a Patient Profile 6 or lower.    9) NYHA Class: IVD

## 2014-10-13 NOTE — OR Nursing (Signed)
First call to SICU made at 1221, second call made to SICU 1316

## 2014-10-13 NOTE — Progress Notes (Signed)
  Echocardiogram Echocardiogram Transesophageal has been performed.  Andre Tran 10/13/2014, 8:43 AM

## 2014-10-13 NOTE — CV Procedure (Signed)
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 Intraoperative transesophageal echocardiography was requested to evaluate the right and left ventricular function, determine any valvular pathology, assess for patent foramen ovale or intracardiac thrombus, and assess functioning of the left ventricular assist device.  The patient was brought to the operating room at [facility] and general anesthesia was induced without difficulty. Following endotracheal intubation and orogastric suctioning, the transesophageal echocardiography probe was inserted into the esophagus without difficulty.  Impression: Pre-bypass findings:  1. Aortic valve: Aortic valve was trileaflet. There was mild thickening of the leaflets. The leaflets opened normally without restriction. There was trivial aortic insufficiency.  2. Mitral valve: There was lateral displacement of the papillary muscles due to LV enlargement. This resulted in apical displacement of the coaptation point. There was 1+ mitral insufficiency with a central jet. There were no prolapsing or flail segments noted.  3. Left ventricle: There was severe global left ventricular dysfunction. The left ventricular cavity was markedly dilated and measured 6.95 cm at end diastole at the mid-papillary level in the transgastric short axis view. There was diffuse thinning of the left ventricular myocardium. The posterior wall measured 0.65 cm and the anterior septum measured 0.58 cm. The ejection fraction was estimated at 15-20%. There were  no obvious regional wall motion abnormalities. There was no LV thrombus noted.  4. Right ventricle: The right ventricular cavity was of normal size. There was normal appearing contractility of the right ventricular free  wall and normal appearing lateral tricuspid annulus systolic excursion. This was consistent with normal right ventricular function.  5. Tricuspid valve: There was 1-2+ tricuspid insufficiency. The jet of tricuspid insufficiency appeared to originate from where a intracardiac defibrillator wire crossed the tricuspid valve in the region of the septal leaflet. The tricuspid annulus measured 3.69 centimeters in the LV inflow outflow view and 3.59 cm in the 4 chamber view at mid diastole.  6. Interatrial septum: Interatrial septum was intact without evidence of patent foramen ovale by color Doppler and careful Valsalva timed bubble study using agitated 5% albumin.  7. Left atrium: There was no thrombus noted within left atrium or left atrial appendage.  8. Ascending aorta: The ascending aorta was of normal diameter. There was no atheromatous disease noted there was no thickening of the walls of the ascending aorta. The aortic root at the sinuses of Valsalva measured 2.80 cm. At the sinotubular ridge 2.36 cm in the proximal a Sinding aorta was 2.51 cm.  9. Descending aorta: The descending aorta was of normal caliber and measured 1.82 cm. There is no atheromatous disease noted within the walls of the descending aorta.  Post-bypass findings:  1. Aortic valve: Following separation from cardiopulmonary bypass and institution of the LVAD flow, the aortic valve opened only intermittently approximately once every 20 beats. There was trivial aortic insufficiency noted which was unchanged from the pre-bypass study.   2. Mitral valve: There was 1+ mitral insufficiency.  3. Left ventricle: The left ventricular assist device inflow cannula was well visualized and was in good position at the cardiac apex. The opening of the cannula was oriented towards the mitral valve and was not abutting the interventricular septum. There was laminar flow into the inflow cannula. There was global left ventricular dysfunction and  the left ventricular cavity size was decreased and measured 6 cm at  end diastole at the mid-papillary level. The interventricular septum was evaluated and depending upon  pump flow and volume status was oriented left or rightward. However following closure of the chest there was no septal flattening and  the curvature was concave towards the right ventricle.   4. Right ventricle: Right ventricular size appeared normal. There was normal appearing contractility of the right ventricular free wall and  normal appearing right ventricular function.  5. Interatrial septum: Interatrial septum was intact without evidence of patent foramen ovale or atrial septal defect by color Doppler.  Roberts Gaudy, MD

## 2014-10-13 NOTE — Progress Notes (Signed)
RT note- Patient taken back to OR with Nitric, ventilator on standby.

## 2014-10-13 NOTE — Anesthesia Procedure Notes (Signed)
Procedure Name: Intubation Performed by: Judeth Cornfield T Pre-anesthesia Checklist: Patient identified, Emergency Drugs available, Suction available, Patient being monitored and Timeout performed Patient Re-evaluated:Patient Re-evaluated prior to inductionOxygen Delivery Method: Circle system utilized Preoxygenation: Pre-oxygenation with 100% oxygen Intubation Type: IV induction Ventilation: Mask ventilation without difficulty Laryngoscope Size: Mac and 3 Grade View: Grade I Tube type: Subglottic suction tube Tube size: 7.5 mm Number of attempts: 1 Placement Confirmation: ETT inserted through vocal cords under direct vision,  breath sounds checked- equal and bilateral,  positive ETCO2 and CO2 detector Secured at: 22 cm Tube secured with: Tape Dental Injury: Teeth and Oropharynx as per pre-operative assessment

## 2014-10-13 NOTE — Anesthesia Preprocedure Evaluation (Signed)
Anesthesia Evaluation  Patient identified by MRN, date of birth, ID bandPreop documentation limited or incomplete due to emergent nature of procedure.  Airway        Dental   Pulmonary          Cardiovascular hypertension, + pacemaker + Cardiac Defibrillator     Neuro/Psych    GI/Hepatic   Endo/Other  diabetes  Renal/GU      Musculoskeletal   Abdominal   Peds  Hematology   Anesthesia Other Findings   Reproductive/Obstetrics                             Anesthesia Physical Anesthesia Plan  ASA: IV and emergent  Anesthesia Plan: General   Post-op Pain Management:    Induction:   Airway Management Planned:   Additional Equipment: Arterial line, CVP, PA Cath, TEE and 3D TEE  Intra-op Plan:   Post-operative Plan: Post-operative intubation/ventilation  Informed Consent:   Plan Discussed with: Anesthesiologist and Surgeon  Anesthesia Plan Comments:         Anesthesia Quick Evaluation

## 2014-10-13 NOTE — Transfer of Care (Signed)
Immediate Anesthesia Transfer of Care Note  Patient: Andre Tran  Procedure(s) Performed: Procedure(s) with comments: INSERTION OF IMPLANTABLE LEFT VENTRICULAR ASSIST DEVICE (N/A) - CIRC ARREST  NITRIC OXIDE TRANSESOPHAGEAL ECHOCARDIOGRAM (TEE) (N/A)  Patient Location: SICU  Anesthesia Type:General  Level of Consciousness: sedated and Patient remains intubated per anesthesia plan  Airway & Oxygen Therapy: Patient remains intubated per anesthesia plan and Patient placed on Ventilator (see vital sign flow sheet for setting)  Post-op Assessment: Report given to RN and Post -op Vital signs reviewed and stable  Post vital signs: Reviewed and stable  Last Vitals:  Filed Vitals:   10/13/14 0502  BP: 105/59  Pulse: 104  Temp: 37.9 C  Resp: 20    Complications: No apparent anesthesia complications

## 2014-10-13 NOTE — Anesthesia Postprocedure Evaluation (Signed)
  Anesthesia Post-op Note  Patient: Andre Tran  Procedure(s) Performed: Procedure(s): EXPLORATION POST OPERATIVE OPEN HEART (N/A)  Patient Location: SICU  Anesthesia Type:General  Level of Consciousness: sedated and Patient remains intubated per anesthesia plan  Airway and Oxygen Therapy: Patient remains intubated per anesthesia plan and Patient placed on Ventilator (see vital sign flow sheet for setting)  Post-op Pain: none  Post-op Assessment: Post-op Vital signs reviewed, Patient's Cardiovascular Status Stable, Respiratory Function Stable and Patent Airway  Post-op Vital Signs: stable  Last Vitals:  Filed Vitals:   10/13/14 1900  BP:   Pulse: 85  Temp: 35.4 C  Resp: 0    Complications: No apparent anesthesia complications

## 2014-10-13 NOTE — Progress Notes (Signed)
CSW met with wife in waiting room. Wife reports that she has been updated on the progress of surgery and has support from her sister. Wife asked appropriate questions regarding recovery process and training. She mentioned that she did not sleep last night and was looking forward to seeing patient in ICU and getting some sleep tonight. She stated "I feel so relieved just that the whole thing is in process". CSW offered support and will continue to follow throughout recovery. Raquel Sarna, [place]

## 2014-10-13 NOTE — Anesthesia Preprocedure Evaluation (Signed)
Anesthesia Evaluation  Patient identified by MRN, date of birth, ID band Patient awake    Reviewed: Allergy & Precautions, NPO status , Patient's Chart, lab work & pertinent test results  Airway Mallampati: II  TM Distance: >3 FB Neck ROM: Full    Dental  (+) Missing, Dental Advisory Given, Poor Dentition   Pulmonary neg pulmonary ROS,  breath sounds clear to auscultation        Cardiovascular hypertension, +CHF + pacemaker + Cardiac Defibrillator Rhythm:Regular Rate:Normal     Neuro/Psych CVA    GI/Hepatic negative GI ROS, Neg liver ROS,   Endo/Other  diabetes  Renal/GU      Musculoskeletal   Abdominal   Peds  Hematology  (+) anemia ,   Anesthesia Other Findings   Reproductive/Obstetrics                             Anesthesia Physical Anesthesia Plan  ASA: III  Anesthesia Plan: General   Post-op Pain Management:    Induction: Intravenous  Airway Management Planned: Oral ETT  Additional Equipment: Arterial line, CVP, PA Cath, TEE, 3D TEE and Ultrasound Guidance Line Placement  Intra-op Plan:   Post-operative Plan: Post-operative intubation/ventilation  Informed Consent: I have reviewed the patients History and Physical, chart, labs and discussed the procedure including the risks, benefits and alternatives for the proposed anesthesia with the patient or authorized representative who has indicated his/her understanding and acceptance.     Plan Discussed with: Anesthesiologist and Surgeon  Anesthesia Plan Comments:         Anesthesia Quick Evaluation

## 2014-10-13 NOTE — Op Note (Signed)
CARDIOVASCULAR SURGERY OPERATIVE NOTE  ARTHOR GORTER [id number] 10/13/2014   Surgeon:  [name], MD  First Assistant: Dahlia Byes, MD  Preoperative Diagnosis: Class IV heart failure with LVEF< 20%   Postoperative Diagnosis:  Same   Procedure:  1.    Median Sternotomy  2.   Extracorporeal circulation  3.    Implantation of HeartMate II Left Ventricular Assist Device.   Anesthesia:  General Endotracheal   Clinical History/Surgical Indication:   The patient is a 54 year old gentleman with a history of chronic systolic heart failure felt to be due to adriamycin that he received for treatment of lymphoma. He has a history of recurrent lymphoma (1993, 2007) treated with chemotherapy. He had a stroke in 2008 with residual right-sided weakness. He has had NICM with an EF of 15-20% dating back to 2009 as well as a history of VT/VF and SJM ICD that was last replaced in 2010. He has been on coumadin for an LV thrombus. He has been on home milrinone for NYHA IV heart failure symptoms and LVAD is felt to be indicated. He was discussed at the [facility] transplant conference and he was not felt to be a transplant candidate due to his lymphoma and prior stroke. He understands that this LVAD is being placed at destination therapy. I discussed the surgical procedure with the patient and his wife including alternatives, benefits and risks including but not limited to bleeding, blood transfusion, infection, stroke, RV dysfunction possibly requiring RVAD support, pump malfunction or thrombosis requiring replacement, organ dysfunction, and death.      Preparation:  The patient was seen in the preoperative holding area and the correct patient, correct operation were confirmed with the patient after reviewing the medical record and catheterization. The consent was signed by me. Preoperative antibiotics were given. A pulmonary arterial line and radial arterial line were placed by the anesthesia team. The  patient was taken back to the operating room and positioned supine on the operating room table. After being placed under general endotracheal anesthesia by the anesthesia team a foley catheter was placed. The neck, chest, abdomen, and both legs were prepped with betadine soap and solution and draped in the usual sterile manner. A surgical time-out was taken and the correct patient and operative procedure were confirmed with the nursing and anesthesia staff.   Pre-bypass and Post-bypass TEE:   Complete TEE assessment was performed by Dr. Roberts Gaudy. This showed severe LV dysfunction with a dilated LV with a diastolic dimension of 6.8 cm. The RV was normal size, not dilated with fairly normal function. There was mild TR that appeared to be around the septal leaflet where the RV lead passed through the tricuspid valve. The annulus was not dilated and I did not feel that tricuspid annuloplasty was indicated or would resolve this TR that appeared to be due to the lead. There was a trace of central AI, mild MR. A bubble study was done with agitated albumin and no PFO was present.     Creation of pump pocket:   A median sternotomy was performed. The pericardium was opened in the midline. Right ventricular function appeared good. The ascending aorta was of normal size and had no palpable plaque. There were no contraindications to aortic cannulation or cross-clamping. The left side of the sternum with retracted with the Ruhltract. The left diaphragm was taken down from the anterior chest wall using electrocautery. This was continued laterally as far as possible. A pre-peritoneal plane was  developed inferiorly and extended across the mid line toward the right. All blood vessels of any size were clipped and divided. The prototype was placed into the pocket to confirm the right size. Then a 4 cm transverse incision was made to the right of the umbilicus and continued down to the rectus fascia using electrocautery.  A skin punch was used to create the drive line exit site about 3 cm below the right costal margin in the anterior axillary line. The straight tunneling device was passed in a subcutaneous plane from the transverse abdominal incision to a point midway to the pericardium and then through the rectus fascia into the pre-peritoneal space. The curved tunneling device was passed from the abdominal incision the the skin exit site.  Cardiopulmonary Bypass:   The patient was fully systemically heparinized and the ACT was maintained > 400 sec. The proximal aortic arch was cannulated with a 20 F aortic cannula for arterial inflow. Venous cannulation was performed via the right atrial appendage using a two-staged venous cannula. Carbon dioxide was insufflated into the pericardium at 6L/min throughout the procedure to minimize intracardiac air. The systemic temperature was allowed to drift downward slightly during the procedure and the patient was actively rewarmed.   Aortic outflow graft anastomosis:  The outflow graft was cut to the appropriate length. It was placed inside the bend relief. The ascending aorta was partially occluded with a Lemole clamp. A vertical midline aortotomy was created and the ends enlarged with a 4.5 mm punch. The outflow graft was anastomosed to the aorta using 4-0 prolene pledgetted sutures at the toe and heal that were run down each side. Coseal was used to seal the needle holes in the graft. The Lemole clamp was removed and the graft flushed and clamped with a peripheral Debakey clamp.   Insertion of apical inflow cannula:  The patient was placed on cardiopulmonary bypass. Laparotomy pads were placed behind the heart to aid exposure of the apex. A site was chosen on the anterior wall lateral to the LAD and superior to the true apex. A stab incision was made and a foley catheter placed through the coring device and into the left ventricle. The balloon was inflated and with gentle  traction on the catheter the coring device was carefully advanced to create the ventriculotomy. The balloon was deflated and removed. A sump was placed into the LV and the ventricular core excised. Trabeculations that might cause obstruction were excised. Then a series of 2-0 Ethibond horizontal mattress sutures were placed around the ventriculotomy. This took 13 sutures. The sutures were placed through the sewing ring on the apical sleeve. The sutures were tied and the sleeve appeared to be well-sealed to the apex. Coseal was applied to seal needle holes. Then volume was left in the heart and the lungs inflated. The HeartMate II was brought onto the operative field. The inflow cannula was inserted into the apical sleeve and the sleeve suture tied. The outflow end of the pump was connected to vent suction. A tie band was placed around the apical sleeve followed by two #5 Ethibond ties. The drive line was then tunneled using the previously placed tunneling devices. A loop was placed in the preperitoneal space. The outflow graft was then attached to the pump and remained clamped. A 20 gauge needle was placed into the outflow graft for deairing. The pump was placed into the pocket.   Weaning from bypass:  The patient was started on Milrinone 0.3 mcg/kg/min, epinephrine 2  mcg/kg/min, levophed 4 and nitric oxide at 30 ppm. The HeartMate II pump was started at 6000 rpm. TEE confirmed that the intracardiac air was removed.  Cardiopulmonary bypass flow was decreased to half flow and the clamp removed from the outflow graft. The speed was increased to 7000 rpm. Flow was decreased to 1/4 and the speed increased to 8000. Cardiopulmonary bypass was discontinued and the speed increased to 8400. Pulmonary artery pressures were normal with a low CVP. CO was 5 L/min. CPB time was 81 minutes.   Completion:  A bipolar epicardial pacing wire was placed on the right ventricle. Heparin was fully reversed with protamine and the  aortic and venous cannulas removed. Hemostasis was achieved. Mediastinal drainage tubes and bilateral pleural chest tubes were placed with a drain in the pocket.  The sternum was closed with #6 stainless steel wires. The fascia was closed with continuous # 1 vicryl suture with interrupted # 1 vicryl sutures in the lower incisional fascia. The subcutaneous tissue was closed with 2-0 vicryl continuous suture. The skin was closed with 3-0 vicryl subcuticular suture. The abdominal incision was closed with continuous 3-0 vicryl subcutaneous suture and 3-0 vicryl subcuticular skin suture. The drive line exit site was re-approximated with 3-0 vicryl subcutaneous sutures and 3-0 nylon skin sutures. The drive line fastener was applied. All sponge, needle, and instrument counts were reported correct at the end of the case. Dry sterile dressings were placed over the incisions and around the chest tubes which were connected to pleurevac suction. The patient was then transported to the surgical intensive care unit in critical but stable condition.

## 2014-10-14 ENCOUNTER — Inpatient Hospital Stay (HOSPITAL_COMMUNITY): Payer: Medicare Other

## 2014-10-14 ENCOUNTER — Encounter (HOSPITAL_COMMUNITY): Payer: Self-pay | Admitting: Surgery

## 2014-10-14 DIAGNOSIS — Z95811 Presence of heart assist device: Secondary | ICD-10-CM

## 2014-10-14 DIAGNOSIS — I5023 Acute on chronic systolic (congestive) heart failure: Secondary | ICD-10-CM

## 2014-10-14 LAB — PREPARE PLATELET PHERESIS
UNIT DIVISION: 0
Unit division: 0
Unit division: 0

## 2014-10-14 LAB — PREPARE FRESH FROZEN PLASMA
UNIT DIVISION: 0
UNIT DIVISION: 0
UNIT DIVISION: 0
UNIT DIVISION: 0
Unit division: 0
Unit division: 0
Unit division: 0
Unit division: 0

## 2014-10-14 LAB — PHOSPHORUS: Phosphorus: 2.5 mg/dL (ref 2.3–4.6)

## 2014-10-14 LAB — CBC WITH DIFFERENTIAL/PLATELET
BASOS ABS: 0 10*3/uL (ref 0.0–0.1)
Basophils Relative: 0 % (ref 0–1)
Eosinophils Absolute: 0.1 10*3/uL (ref 0.0–0.7)
Eosinophils Relative: 1 % (ref 0–5)
HEMATOCRIT: 23.7 % — AB (ref 39.0–52.0)
Hemoglobin: 8.5 g/dL — ABNORMAL LOW (ref 13.0–17.0)
Lymphocytes Relative: 9 % — ABNORMAL LOW (ref 12–46)
Lymphs Abs: 0.8 10*3/uL (ref 0.7–4.0)
MCH: 29.5 pg (ref 26.0–34.0)
MCHC: 35.9 g/dL (ref 30.0–36.0)
MCV: 82.3 fL (ref 78.0–100.0)
MONO ABS: 0.8 10*3/uL (ref 0.1–1.0)
Monocytes Relative: 9 % (ref 3–12)
Neutro Abs: 7 10*3/uL (ref 1.7–7.7)
Neutrophils Relative %: 81 % — ABNORMAL HIGH (ref 43–77)
Platelets: 96 10*3/uL — ABNORMAL LOW (ref 150–400)
RBC: 2.88 MIL/uL — ABNORMAL LOW (ref 4.22–5.81)
RDW: 14.6 % (ref 11.5–15.5)
WBC: 8.6 10*3/uL (ref 4.0–10.5)

## 2014-10-14 LAB — COMPREHENSIVE METABOLIC PANEL
ALK PHOS: 36 U/L — AB (ref 39–117)
ALT: 36 U/L (ref 0–53)
ALT: 47 U/L (ref 0–53)
AST: 105 U/L — ABNORMAL HIGH (ref 0–37)
AST: 133 U/L — AB (ref 0–37)
Albumin: 3.3 g/dL — ABNORMAL LOW (ref 3.5–5.2)
Albumin: 3 g/dL — ABNORMAL LOW (ref 3.5–5.2)
Alkaline Phosphatase: 46 U/L (ref 39–117)
Anion gap: 4 — ABNORMAL LOW (ref 5–15)
Anion gap: 7 (ref 5–15)
BILIRUBIN TOTAL: 4.1 mg/dL — AB (ref 0.3–1.2)
BUN: 10 mg/dL (ref 6–23)
BUN: 7 mg/dL (ref 6–23)
CHLORIDE: 104 mmol/L (ref 96–112)
CO2: 24 mmol/L (ref 19–32)
CO2: 21 mmol/L (ref 19–32)
Calcium: 7 mg/dL — ABNORMAL LOW (ref 8.4–10.5)
Calcium: 7.6 mg/dL — ABNORMAL LOW (ref 8.4–10.5)
Chloride: 104 mmol/L (ref 96–112)
Creatinine, Ser: 0.92 mg/dL (ref 0.50–1.35)
Creatinine, Ser: 0.91 mg/dL (ref 0.50–1.35)
GFR calc Af Amer: >90 mL/min (ref >90)
GFR calc Af Amer: >90 mL/min (ref >90)
GLUCOSE: 127 mg/dL — AB (ref 70–99)
Glucose, Bld: 95 mg/dL (ref 70–99)
POTASSIUM: 3.9 mmol/L (ref 3.5–5.1)
Potassium: 4.5 mmol/L (ref 3.5–5.1)
Sodium: 132 mmol/L — ABNORMAL LOW (ref 135–145)
Sodium: 132 mmol/L — ABNORMAL LOW (ref 135–145)
TOTAL PROTEIN: 4.7 g/dL — AB (ref 6.0–8.3)
Total Bilirubin: 4.8 mg/dL — ABNORMAL HIGH (ref 0.3–1.2)
Total Protein: 4.9 g/dL — ABNORMAL LOW (ref 6.0–8.3)

## 2014-10-14 LAB — POCT I-STAT 4, (NA,K, GLUC, HGB,HCT)
GLUCOSE: 134 mg/dL — AB (ref 70–99)
HEMATOCRIT: 30 % — AB (ref 39.0–52.0)
Hemoglobin: 10.2 g/dL — ABNORMAL LOW (ref 13.0–17.0)
Potassium: 3.3 mmol/L — ABNORMAL LOW (ref 3.5–5.1)
Sodium: 134 mmol/L — ABNORMAL LOW (ref 135–145)

## 2014-10-14 LAB — LACTATE DEHYDROGENASE: LDH: 376 U/L — AB (ref 94–250)

## 2014-10-14 LAB — POCT I-STAT 3, ART BLOOD GAS (G3+)
ACID-BASE DEFICIT: 1 mmol/L (ref 0.0–2.0)
Bicarbonate: 24.3 mEq/L — ABNORMAL HIGH (ref 20.0–24.0)
O2 Saturation: 100 %
PO2 ART: 182 mmHg — AB (ref 80.0–100.0)
Patient temperature: 36.8
TCO2: 26 mmol/L (ref 0–100)
pCO2 arterial: 39.9 mmHg (ref 35.0–45.0)
pH, Arterial: 7.392 (ref 7.350–7.450)

## 2014-10-14 LAB — CBC
HCT: 26.8 % — ABNORMAL LOW (ref 39.0–52.0)
Hemoglobin: 9.7 g/dL — ABNORMAL LOW (ref 13.0–17.0)
MCH: 29.1 pg (ref 26.0–34.0)
MCHC: 36.2 g/dL — ABNORMAL HIGH (ref 30.0–36.0)
MCV: 80.5 fL (ref 78.0–100.0)
PLATELETS: 60 10*3/uL — AB (ref 150–400)
RBC: 3.33 MIL/uL — AB (ref 4.22–5.81)
RDW: 15.2 % (ref 11.5–15.5)
WBC: 10.6 10*3/uL — ABNORMAL HIGH (ref 4.0–10.5)

## 2014-10-14 LAB — GLUCOSE, CAPILLARY
GLUCOSE-CAPILLARY: 112 mg/dL — AB (ref 70–99)
GLUCOSE-CAPILLARY: 118 mg/dL — AB (ref 70–99)
GLUCOSE-CAPILLARY: 125 mg/dL — AB (ref 70–99)
GLUCOSE-CAPILLARY: 125 mg/dL — AB (ref 70–99)
GLUCOSE-CAPILLARY: 136 mg/dL — AB (ref 70–99)
Glucose-Capillary: 109 mg/dL — ABNORMAL HIGH (ref 70–99)
Glucose-Capillary: 117 mg/dL — ABNORMAL HIGH (ref 70–99)
Glucose-Capillary: 130 mg/dL — ABNORMAL HIGH (ref 70–99)
Glucose-Capillary: 132 mg/dL — ABNORMAL HIGH (ref 70–99)

## 2014-10-14 LAB — CARBOXYHEMOGLOBIN
Carboxyhemoglobin: 2 % — ABNORMAL HIGH (ref 0.5–1.5)
METHEMOGLOBIN: 1.8 % — AB (ref 0.0–1.5)
O2 Saturation: 74.8 %
TOTAL HEMOGLOBIN: 7.5 g/dL — AB (ref 13.5–18.0)

## 2014-10-14 LAB — CREATININE, SERUM
Creatinine, Ser: 0.86 mg/dL (ref 0.50–1.35)
GFR calc Af Amer: >90 mL/min (ref >90)
GFR calc non Af Amer: >90 mL/min (ref >90)

## 2014-10-14 LAB — MAGNESIUM
MAGNESIUM: 2 mg/dL (ref 1.5–2.5)
Magnesium: 2.4 mg/dL (ref 1.5–2.5)

## 2014-10-14 LAB — PREPARE RBC (CROSSMATCH)

## 2014-10-14 LAB — BRAIN NATRIURETIC PEPTIDE: B NATRIURETIC PEPTIDE 5: 540 pg/mL — AB (ref 0.0–100.0)

## 2014-10-14 LAB — PROTIME-INR
INR: 1.58 — ABNORMAL HIGH (ref 0.00–1.49)
PROTHROMBIN TIME: 19.1 s — AB (ref 11.6–15.2)

## 2014-10-14 MED ORDER — SODIUM CHLORIDE 0.9 % IV SOLN
Freq: Once | INTRAVENOUS | Status: AC
  Administered 2014-10-14: 13:00:00 via INTRAVENOUS

## 2014-10-14 MED ORDER — SODIUM CHLORIDE 0.9 % IV SOLN
Freq: Once | INTRAVENOUS | Status: AC
  Administered 2014-10-14: 09:00:00 via INTRAVENOUS

## 2014-10-14 MED ORDER — ALBUMIN HUMAN 5 % IV SOLN
12.5000 g | Freq: Once | INTRAVENOUS | Status: AC
  Administered 2014-10-14: 12.5 g via INTRAVENOUS
  Filled 2014-10-14: qty 250

## 2014-10-14 MED ORDER — CALCIUM CHLORIDE 10 % IV SOLN
1.0000 g | Freq: Once | INTRAVENOUS | Status: AC
  Administered 2014-10-14: 1 g via INTRAVENOUS
  Filled 2014-10-14: qty 10

## 2014-10-14 MED FILL — Phenylephrine HCl Inj 10 MG/ML: INTRAMUSCULAR | Qty: 2 | Status: AC

## 2014-10-14 MED FILL — Dextrose Inj 5%: INTRAVENOUS | Qty: 250 | Status: AC

## 2014-10-14 MED FILL — Mannitol IV Soln 20%: INTRAVENOUS | Qty: 500 | Status: AC

## 2014-10-14 MED FILL — Sodium Chloride IV Soln 0.9%: INTRAVENOUS | Qty: 2000 | Status: AC

## 2014-10-14 MED FILL — Sodium Bicarbonate IV Soln 8.4%: INTRAVENOUS | Qty: 50 | Status: AC

## 2014-10-14 MED FILL — Heparin Sodium (Porcine) Inj 1000 Unit/ML: INTRAMUSCULAR | Qty: 10 | Status: AC

## 2014-10-14 MED FILL — Heparin Sodium (Porcine) Inj 1000 Unit/ML: INTRAMUSCULAR | Qty: 30 | Status: AC

## 2014-10-14 MED FILL — Potassium Chloride Inj 2 mEq/ML: INTRAVENOUS | Qty: 40 | Status: AC

## 2014-10-14 MED FILL — Lidocaine HCl IV Inj 20 MG/ML: INTRAVENOUS | Qty: 5 | Status: AC

## 2014-10-14 MED FILL — Magnesium Sulfate Inj 50%: INTRAMUSCULAR | Qty: 10 | Status: AC

## 2014-10-14 MED FILL — Electrolyte-R (PH 7.4) Solution: INTRAVENOUS | Qty: 3000 | Status: AC

## 2014-10-14 NOTE — Progress Notes (Signed)
Updated Dr Prescott Gum on pt condition. PI 2.4-3.5,PF 3.4-3.6, CVP 17-23, drips and CT output. orders received to give Albumin with calcium, increase Epi from 2-4, give 10 units Cryo, sputum culture, and max of 71mcg/min on Levo.  Will implement and continue to monitor.

## 2014-10-14 NOTE — Progress Notes (Signed)
OT Cancellation Note  Patient Details Name: Andre Tran MRN: [id number] DOB: 12/12/1960   Cancelled Treatment:    Reason Eval/Treat Not Completed: Patient not medically ready  [place], OTR/L  [phone] 10/14/2014 10/14/2014, 9:25 AM

## 2014-10-14 NOTE — Progress Notes (Addendum)
HeartMate 2 Rounding Note  Subjective:    POD #1 S/P LVAD. Taken back to OR due to bleeding, now stabilized. On milrinone 0.5 mcg, Epi 4 mcg, and levo 3 mcg. Received total of 7UPRBCs. Hemoglobin down from 10.9>8.5 .   CO-OX 74%  LDH 376  LVAD INTERROGATION:  HeartMate II LVAD:  Flow 4.2  liters/min, speed 8800, power 4.2, PI 7.    Objective:    Vital Signs:   Temp:  [95.2 F (35.1 C)-98.6 F (37 C)] 97.5 F (36.4 C) (04/05 0715) Pulse Rate:  [31-85] 81 (04/05 0715) Resp:  [0-29] 19 (04/05 0715) SpO2:  [98 %-100 %] 100 % (04/05 0715) Arterial Line BP: (52-121)/(41-87) 111/81 mmHg (04/05 0715) FiO2 (%):  [40 %-60 %] 40 % (04/05 0600) Weight:  [192 lb 0.3 oz (87.1 kg)] 192 lb 0.3 oz (87.1 kg) (04/05 0500) Last BM Date: 10/10/14 Mean arterial Pressure 90  Intake/Output:   Intake/Output Summary (Last 24 hours) at 10/14/14 0742 Last data filed at 10/14/14 0700  Gross per 24 hour  Intake 13805.53 ml  Output   7045 ml  Net 6760.53 ml     Physical Exam: General:  Intubated. Sedated.  HEENT: normal Neck: supple. JVP to jaw. Carotids 2+ bilat; no bruits. No lymphadenopathy or thryomegaly appreciated. RIJ  swan  Cor: Mechanical heart sounds with LVAD hum present. Lungs: clear Abdomen: soft, nontender, nondistended. No hepatosplenomegaly. No bruits or masses. Good bowel sounds. Driveline: C/D/I; securement device intact and driveline incorporated Extremities: no cyanosis, clubbing, rash, edema Neuro: Sedated on vent.  GU: Foley   Telemetry: NSR   Labs: Basic Metabolic Panel:  Recent Labs Lab 10/12/14 0610 10/12/14 0946 10/13/14 0343  10/13/14 1038 10/13/14 1121 10/13/14 1218 10/13/14 1431 10/13/14 1640 10/13/14 1830 10/14/14 0245  NA 132* 129* 129*  < > 131* 130* 131* 133* 134*  --  132*  K 5.2* 4.2 4.4  < > 4.7 3.8 3.5 3.0* 3.7  --  4.5  CL 102 98 98  < > 94* 93* 93*  --  107  --  104  CO2 22 24 22   --   --   --   --   --  22  --  24  GLUCOSE 123* 222* 110*   < > 151* 121* 145* 122* 140*  --  127*  BUN 16 18 17   < > 15 14 14   --  11  --  10  CREATININE 1.16 1.38* 1.30  < > 0.80 0.80 0.90  --  0.88 1.01 0.92  CALCIUM 9.0 8.7 8.7  --   --   --   --   --  6.8*  --  7.0*  MG  --   --   --   --   --   --   --   --   --  1.6 2.4  PHOS  --   --   --   --   --   --   --   --   --   --  2.5  < > = values in this interval not displayed.  Liver Function Tests:  Recent Labs Lab 10/10/14 1510 10/12/14 0946 10/14/14 0245  AST 19 31 105*  ALT 16 27 36  ALKPHOS 76 78 36*  BILITOT 1.1 0.7 4.1*  PROT 6.7 6.8 4.9*  ALBUMIN 3.7 3.4* 3.3*   No results for input(s): LIPASE, AMYLASE in the last 168 hours. No results for input(s): AMMONIA in the  last 168 hours.  CBC:  Recent Labs Lab 10/10/14 1510  10/13/14 0343  10/13/14 1125  10/13/14 1431 10/13/14 1455 10/13/14 1640 10/13/14 1830 10/14/14 0245  WBC 6.8  < > 6.6  --   --   --   --  10.0 10.0 9.7 8.6  NEUTROABS 4.4  --   --   --   --   --   --   --   --   --  7.0  HGB 9.2*  < > 9.4*  < > 9.0*  < > 10.2* 7.4* 8.4* 10.9* 8.5*  HCT 26.9*  < > 27.5*  < > 25.7*  < > 30.0* 20.7* 24.2* 30.9* 23.7*  MCV 87.3  < > 88.4  --   --   --   --  85.2 86.7 85.6 82.3  PLT 274  < > 214  --  95*  --   --  110* 61*  61* 59* 96*  < > = values in this interval not displayed.  INR:  Recent Labs Lab 10/10/14 1510 10/12/14 0610 10/13/14 1455 10/13/14 1640 10/14/14 0245  INR 1.55* 1.20 1.55* NOT CALCULATED  2.22* 1.58*    Other results:  EKG:   Imaging: Dg Chest Port 1 View  10/13/2014   CLINICAL DATA:  Check endotracheal tube position.  EXAM: PORTABLE CHEST - 1 VIEW  COMPARISON:  10/13/2014  FINDINGS: Endotracheal tube is in good position, tip at the clavicular heads. The gastric suction tube reaches the stomach at least.  Shortening of Swan-Ganz catheter from the right, tip at the main pulmonary artery level. Right IJ central line, tip at the upper right atrium. Thoracic drains are in similar position.  Grossly unchanged positioning of a left ventricular assist device, partially imaged currently. No interval displacement of dual-chamber ICD/ pacer leads.  Improved lung volumes . No evidence of air leak or pleural effusion. Stable cardiopericardial enlargement.  IMPRESSION: 1. Unremarkable positioning of tubes and lines, as above. 2. Improved retrocardiac aeration.   Electronically Signed   By: Monte Fantasia M.D.   On: 10/13/2014 19:59   Dg Chest Port 1 View  10/13/2014   CLINICAL DATA:  Left ventricular assist device placement  EXAM: PORTABLE CHEST - 1 VIEW  COMPARISON:  October 11, 2014  FINDINGS: There is a left ventricular assist device present. Endotracheal tube tip is 4.6 cm above the carina. Swan-Ganz catheter tip is in the right main pulmonary outflow tract. There is a nasogastric tube with the tip and side-port in the stomach. There are bilateral chest tubes as well as mediastinal drains. There is a central catheter with tip in superior vena cava near the cavoatrial junction. Pacemaker lead tips are attached to the right atrium and right ventricle. No pneumothorax.  There is left lower lobe volume loss. Lungs elsewhere clear. Heart is borderline prominent with pulmonary vascularity within normal limits. No adenopathy. No bone lesions.  IMPRESSION: Tube and catheter positions as described without pneumothorax. Left lower lobe volume loss, probably atelectatic. Lungs elsewhere clear. Left ventricular assist device present.   Electronically Signed   By: Lowella Grip III M.D.   On: 10/13/2014 15:05      Medications:     Scheduled Medications: . sodium chloride   Intravenous Once  . sodium chloride   Intravenous Once  . sodium chloride   Intravenous Once  . acetaminophen  1,000 mg Oral 4 times per day   Or  . acetaminophen (TYLENOL) oral liquid 160 mg/5 mL  1,000 mg Per Tube 4 times per day  . antiseptic oral rinse  7 mL Mouth Rinse QID  . aspirin EC  325 mg Oral Daily   Or  . aspirin   324 mg Per Tube Daily   Or  . aspirin  300 mg Rectal Daily  . atorvastatin  20 mg Oral QHS  . bisacodyl  10 mg Oral Daily   Or  . bisacodyl  10 mg Rectal Daily  . cefUROXime (ZINACEF)  IV  1.5 g Intravenous Q12H  . chlorhexidine  15 mL Mouth Rinse BID  . digoxin  0.125 mg Oral Daily  . docusate sodium  200 mg Oral Daily  . insulin regular  0-10 Units Intravenous TID WC  . magnesium oxide  400 mg Oral Q breakfast  . [START ON 10/15/2014] pantoprazole  40 mg Oral Daily  . rifampin  600 mg Oral Once  . sodium chloride  3 mL Intravenous Q12H  . vancomycin  1,000 mg Intravenous Q12H     Infusions: . sodium chloride    . sodium chloride 250 mL (10/14/14 0644)  . sodium chloride Stopped (10/13/14 1854)  . dexmedetomidine 0.7 mcg/kg/hr (10/14/14 0700)  . EPINEPHrine 4 mg in dextrose 5% 250 mL infusion (16 mcg/mL) 4 mcg/min (10/14/14 0700)  . insulin (NOVOLIN-R) infusion 4.7 Units/hr (10/14/14 0700)  . lactated ringers 20 mL/hr at 10/14/14 0700  . lactated ringers 20 mL/hr at 10/14/14 0700  . milrinone 0.5 mcg/kg/min (10/14/14 0700)  . nitroGLYCERIN    . norepinephrine (LEVOPHED) Adult infusion 4 mcg/min (10/14/14 0700)  . phenylephrine (NEO-SYNEPHRINE) Adult infusion       PRN Medications:  sodium chloride, albuterol, bisacodyl, midazolam, morphine injection, ondansetron (ZOFRAN) IV, oxyCODONE, sodium chloride, traMADol   Assessment:  1. POD #1 S/P LVAD  2. Acute Blood Loss- Back to OR 4/5  3. Chronic Systolic Heart Failure  4. NICM - Echo (4/2) with EF 20%, no LV thrombus, moderately dilated RV with mildly decreased systolic function, mild MR, moderate TR. chemo induced adriamycin 5.   H/O LV thrombus- 6.   Non Hodgkins Lymphoma 7.   H/O CVA 2008  8.  H/O VT/VF --St Jude ICD 2010  9.   DMII  Plan/Discussion:   POD #1 S/P LVAD.   Doing ok. Watch hemoglobin closely. Weaning drips as tolerated. Hemodynamics ok.   I reviewed the LVAD parameters from today, and compared  the results to the patient's prior recorded data.  No programming changes were made.  The LVAD is functioning within specified parameters.  The patient performs LVAD self-test daily.  LVAD interrogation was negative for any significant power changes, alarms or PI events/speed drops.  LVAD equipment check completed and is in good working order.  Back-up equipment present.   LVAD education done on emergency procedures and precautions and reviewed exit site care.  Length of Stay: 4  CLEGG,AMY 10/14/2014, 7:42 AM  VAD Team --- VAD ISSUES ONLY--- Pager [phone] (7am - 7am)  Advanced Heart Failure Team  Pager [phone] (M-F; 7a - 4p)  Please contact [facility] for night-coverage after hours (4p -7a ) and weekends on [email or web address]  Patient seen with NP, agree with the above note.  Some fall in PI this morning, hemoglobin lower.  Will give addition 1 unit PRBCs.  Continue drips.  Cardiac index (2.2) and co-ox stable.   Loralie Champagne 10/14/2014 8:42 AM

## 2014-10-14 NOTE — Op Note (Signed)
10/13/2014 Andre Tran [id number]  Surgeon: [name], MD   First Assistant: Dahlia Byes, MD   Preoperative Diagnosis: Postop bleeding s/p LVAD  Postoperative Diagnosis: Same   Procedure:  1.  Median Sternotomy 2.  Evacuation of hemopericardium and hemothorax 3.  Ligation of chest wall muscle bleeding  Anesthesia: General Endotracheal   Clinical History/Surgical Indication:   The patient underwent LVAD placement earlier in the day that went very well and was hemostatic at the conclusion of surgery. After return to the ICU he looked good for about 30 minutes and then started having chest tube bleeding that increased to the point that the chest tubes were clotting. Since he did not appear coagulopathic I felt it was best to return to the OR for exploration.  Preparation:  The patient was taken directly to the operating room. The patient was positioned supine on the operating room table. After being placed under general endotracheal anesthesia by the anesthesia team, the neck, chest, and abdomen were prepped with betadine soap and solution and draped in the usual sterile manner. A surgical time-out was taken and the correct patient and operative procedure were confirmed with the nursing and anesthesia staff.   Median Sternotomy:   The previous median sternotomy was opened. Upon separating the sternum there was immediate drainage of a  moderate volume of blood.  The blood pressure immediately rose and the pump parameters all improved. There was a large amount of blood clot in the mediastinum, pump pocket and left pleural space. This was evacuated. The cannulation sites were hemostatic. The outflow graft to aortic anastomosis and the LV to inflow cannula sites were hemostatic. The bleeding appeared to be coming from the rectus muscle at the right lower end of the incision where there was a small arterial bleeder still pumping. This was suture ligated with 4-0 prolene pledgeted sutures.  The mediastinum was irrigated with saline. There was complete hemostasis.   Completion:   Mediastinal and pleural drainage tubes were declotted and returned to their normal position. The sternum was closed with #6 stainless steel wires. The fascia was closed with continuous # 1 vicryl suture with interrupted #1 vicryl sutures at the lower end. The subcutaneous tissue was closed with 2-0 vicryl continuous suture. The skin was closed with 3-0 vicryl subcuticular suture. All sponge, needle, and instrument counts were reported correct at the end of the case. Dry sterile dressings were placed over the incisions and around the chest tubes which were connected to pleurevac suction. The patient was then transported to the surgical intensive care unit in critical but  stable condition.

## 2014-10-14 NOTE — Progress Notes (Signed)
Dr. Cyndia Bent paged due to low PI around 2.7-3. All other vital signs are stable and patient is comfortable and resting in the bed. Will transfuse another unit of blood. Will continue to monitor.  Sandre Kitty

## 2014-10-14 NOTE — Progress Notes (Signed)
PT Cancellation Note  Patient Details Name: Andre Tran MRN: [id number] DOB: 1961/07/11   Cancelled Treatment:    Reason Eval/Treat Not Completed: Order to begin PT on POD 2 received. Will follow and initiate PT 10/15/14 as medically appropriate   Larin Weissberg 10/14/2014, 8:20 AM Pager [phone]

## 2014-10-14 NOTE — Progress Notes (Signed)
CSW met with wife at bedside. Patient resting comfortably in bed on vent. Wife reports he awakens and "clearly doesn't like the tube". Wife stated that she is grateful for all the support and information provided prior to surgery. She felt she was prepared for seeing patient post op in the ICU. Wife states that her daughters are coping well and have visited patient. CSW provided support and will continue to follow throughout recovery. Raquel Sarna, [place]

## 2014-10-14 NOTE — Progress Notes (Signed)
Patient ID: Andre Tran, male   DOB: 1961/06/16, 54 y.o.   MRN: [id number]  HeartMate 2 Rounding Note  Subjective:    POD 1 s/p LVAD and takeback for bleeding from the chest wall. His RV function look good on pre-bypass TEE and post-bypass TEE. Initially out of the OR on milrinone 0.3, epi 4, levo 4. After exploration for bleeding he has had some problems with low PI and CI, elevated CVP up to 22. Milrinone increased to 0.5. He seems to have stabilized overnight with reduction in CVP and PA pressures, improved CI and stable MAP.  Today he is on milrinone 0.5, epi 4, levo 3-4, NO 30 ppm.  CI 2.1 Co-ox 75% CVP 13-16    LVAD INTERROGATION:  HeartMate II LVAD:  Flow 3.9 liters/min, speed 8800, power 4, PI 6.4.    Objective:    Vital Signs:   Temp:  [95.2 F (35.1 C)-100 F (37.8 C)] 99.9 F (37.7 C) (04/05 1600) Pulse Rate:  [67-85] 83 (04/05 0735) Resp:  [0-26] 24 (04/05 1600) SpO2:  [99 %-100 %] 100 % (04/05 1328) Arterial Line BP: (65-121)/(62-87) 93/78 mmHg (04/05 1600) FiO2 (%):  [40 %-50 %] 40 % (04/05 1540) Weight:  [87.1 kg (192 lb 0.3 oz)] 87.1 kg (192 lb 0.3 oz) (04/05 0500) Last BM Date: 10/10/14 Mean arterial Pressure 83  Intake/Output:   Intake/Output Summary (Last 24 hours) at 10/14/14 1615 Last data filed at 10/14/14 1500  Gross per 24 hour  Intake 8715.93 ml  Output   5335 ml  Net 3380.93 ml     Physical Exam: General:  Remains sedated on vent HEENT: normal Neck: Carotids 2+ bilat; no bruits.  Cor: Faint heart sounds with LVAD hum present. Lungs: clear Abdomen: soft, nontender, nondistended. No hepatosplenomegaly. No bruits or masses. hypoactive bowel sounds. Extremities: moderate edema Neuro: sedated but responds and follow commands  Telemetry:  Sinus 90-100  Labs: Basic Metabolic Panel:  Recent Labs Lab 10/12/14 0610 10/12/14 0946 10/13/14 0343  10/13/14 1038 10/13/14 1121 10/13/14 1218 10/13/14 1431 10/13/14 1640 10/13/14 1830  10/13/14 1835 10/14/14 0245  NA 132* 129* 129*  < > 131* 130* 131* 133* 134*  --  134* 132*  K 5.2* 4.2 4.4  < > 4.7 3.8 3.5 3.0* 3.7  --  3.3* 4.5  CL 102 98 98  < > 94* 93* 93*  --  107  --   --  104  CO2 22 24 22   --   --   --   --   --  22  --   --  24  GLUCOSE 123* 222* 110*  < > 151* 121* 145* 122* 140*  --  134* 127*  BUN 16 18 17   < > 15 14 14   --  11  --   --  10  CREATININE 1.16 1.38* 1.30  < > 0.80 0.80 0.90  --  0.88 1.01  --  0.92  CALCIUM 9.0 8.7 8.7  --   --   --   --   --  6.8*  --   --  7.0*  MG  --   --   --   --   --   --   --   --   --  1.6  --  2.4  PHOS  --   --   --   --   --   --   --   --   --   --   --  2.5  < > = values in this interval not displayed.  Liver Function Tests:  Recent Labs Lab 10/10/14 1510 10/12/14 0946 10/14/14 0245  AST 19 31 105*  ALT 16 27 36  ALKPHOS 76 78 36*  BILITOT 1.1 0.7 4.1*  PROT 6.7 6.8 4.9*  ALBUMIN 3.7 3.4* 3.3*   No results for input(s): LIPASE, AMYLASE in the last 168 hours. No results for input(s): AMMONIA in the last 168 hours.  CBC:  Recent Labs Lab 10/10/14 1510  10/13/14 0343  10/13/14 1125  10/13/14 1455 10/13/14 1640 10/13/14 1830 10/13/14 1835 10/14/14 0245  WBC 6.8  < > 6.6  --   --   --  10.0 10.0 9.7  --  8.6  NEUTROABS 4.4  --   --   --   --   --   --   --   --   --  7.0  HGB 9.2*  < > 9.4*  < > 9.0*  < > 7.4* 8.4* 10.9* 10.2* 8.5*  HCT 26.9*  < > 27.5*  < > 25.7*  < > 20.7* 24.2* 30.9* 30.0* 23.7*  MCV 87.3  < > 88.4  --   --   --  85.2 86.7 85.6  --  82.3  PLT 274  < > 214  --  95*  --  110* 61*  61* 59*  --  96*  < > = values in this interval not displayed.  INR:  Recent Labs Lab 10/10/14 1510 10/12/14 0610 10/13/14 1455 10/13/14 1640 10/14/14 0245  INR 1.55* 1.20 1.55* NOT CALCULATED  2.22* 1.58*   LDH 376  Other results:  EKG:   Imaging: Dg Chest Port 1 View  10/14/2014   CLINICAL DATA:  Post LEFT ventricular assist device placement  EXAM: PORTABLE CHEST - 1 VIEW   COMPARISON:  Portable exam 0649 hours compared to 10/13/2014  FINDINGS: Tip of endotracheal tube projects 4.1 cm above carina.  Nasogastric tube extends into stomach.  RIGHT jugular Swan-Ganz catheter tip projects over pulmonary artery bifurcation.  Additional RIGHT jugular line with tip projecting over SVC.  Mediastinal drain and BILATERAL thoracostomy tubes present.  Superior margin of a LEFT ventricular assist device is is identified.  Enlargement of cardiac silhouette.  Stable mediastinal contours with a slight pulmonary vascular congestion.  Question minimal perihilar edema.  No pneumothorax.  IMPRESSION: Line and tube placements as above.  Suspect minimal pulmonary edema.   Electronically Signed   By: Lavonia Dana M.D.   On: 10/14/2014 07:52   Dg Chest Port 1 View  10/13/2014   CLINICAL DATA:  Check endotracheal tube position.  EXAM: PORTABLE CHEST - 1 VIEW  COMPARISON:  10/13/2014  FINDINGS: Endotracheal tube is in good position, tip at the clavicular heads. The gastric suction tube reaches the stomach at least.  Shortening of Swan-Ganz catheter from the right, tip at the main pulmonary artery level. Right IJ central line, tip at the upper right atrium. Thoracic drains are in similar position. Grossly unchanged positioning of a left ventricular assist device, partially imaged currently. No interval displacement of dual-chamber ICD/ pacer leads.  Improved lung volumes . No evidence of air leak or pleural effusion. Stable cardiopericardial enlargement.  IMPRESSION: 1. Unremarkable positioning of tubes and lines, as above. 2. Improved retrocardiac aeration.   Electronically Signed   By: Monte Fantasia M.D.   On: 10/13/2014 19:59   Dg Chest Port 1 View  10/13/2014   CLINICAL DATA:  Left ventricular  assist device placement  EXAM: PORTABLE CHEST - 1 VIEW  COMPARISON:  October 11, 2014  FINDINGS: There is a left ventricular assist device present. Endotracheal tube tip is 4.6 cm above the carina. Swan-Ganz catheter  tip is in the right main pulmonary outflow tract. There is a nasogastric tube with the tip and side-port in the stomach. There are bilateral chest tubes as well as mediastinal drains. There is a central catheter with tip in superior vena cava near the cavoatrial junction. Pacemaker lead tips are attached to the right atrium and right ventricle. No pneumothorax.  There is left lower lobe volume loss. Lungs elsewhere clear. Heart is borderline prominent with pulmonary vascularity within normal limits. No adenopathy. No bone lesions.  IMPRESSION: Tube and catheter positions as described without pneumothorax. Left lower lobe volume loss, probably atelectatic. Lungs elsewhere clear. Left ventricular assist device present.   Electronically Signed   By: Lowella Grip III M.D.   On: 10/13/2014 15:05      Medications:     Scheduled Medications: . sodium chloride   Intravenous Once  . sodium chloride   Intravenous Once  . sodium chloride   Intravenous Once  . acetaminophen  1,000 mg Oral 4 times per day   Or  . acetaminophen (TYLENOL) oral liquid 160 mg/5 mL  1,000 mg Per Tube 4 times per day  . antiseptic oral rinse  7 mL Mouth Rinse QID  . aspirin EC  325 mg Oral Daily   Or  . aspirin  324 mg Per Tube Daily   Or  . aspirin  300 mg Rectal Daily  . atorvastatin  20 mg Oral QHS  . bisacodyl  10 mg Oral Daily   Or  . bisacodyl  10 mg Rectal Daily  . cefUROXime (ZINACEF)  IV  1.5 g Intravenous Q12H  . chlorhexidine  15 mL Mouth Rinse BID  . digoxin  0.125 mg Oral Daily  . docusate sodium  200 mg Oral Daily  . insulin regular  0-10 Units Intravenous TID WC  . magnesium oxide  400 mg Oral Q breakfast  . [START ON 10/15/2014] pantoprazole  40 mg Oral Daily  . sodium chloride  3 mL Intravenous Q12H  . vancomycin  1,000 mg Intravenous Q12H     Infusions: . sodium chloride    . sodium chloride 250 mL (10/14/14 0644)  . sodium chloride Stopped (10/13/14 1854)  . dexmedetomidine 0.399 mcg/kg/hr  (10/14/14 1500)  . EPINEPHrine 4 mg in dextrose 5% 250 mL infusion (16 mcg/mL) 4 mcg/min (10/14/14 1500)  . insulin (NOVOLIN-R) infusion 6 Units/hr (10/14/14 1500)  . lactated ringers 20 mL/hr at 10/14/14 1500  . lactated ringers 20 mL/hr at 10/14/14 1500  . milrinone 0.499 mcg/kg/min (10/14/14 1500)  . nitroGLYCERIN    . norepinephrine (LEVOPHED) Adult infusion 2 mcg/min (10/14/14 1500)  . phenylephrine (NEO-SYNEPHRINE) Adult infusion       PRN Medications:  sodium chloride, albuterol, bisacodyl, midazolam, morphine injection, ondansetron (ZOFRAN) IV, oxyCODONE, sodium chloride, traMADol   Assessment:   1. POD #1 S/P LVAD and reexploration for bleeding 2. Acute Blood Lossanemia. Transfuse to keep Hgb > 9 3. Chronic Systolic Heart Failure with preop echo showing EF 20%, no LV thrombus, moderately dilated RV with mildly decreased systolic function, mild MR, moderate TR. chemo induced adriamycin. 4. H/O LV thrombus- on coumadin preop 5. Non Hodgkins Lymphoma 6. H/O CVA 2008  7. H/O VT/VF --St Jude ICD 2010  8. DMII  Plan/Discussion:  He seems to have stabilized today but remains on the vent on NO. Will consider weaning this slowly overnight so that vent can be weaned tomorrow. Continue other inotropes/vasopressors.  Chest tube output has slowed since cryo given early this am for postop coagulopathy. Continue to suction.   I reviewed the LVAD parameters from today, and compared the results to the patient's prior recorded data.  No programming changes were made.  The LVAD is functioning within specified parameters.  The patient performs LVAD self-test daily.  LVAD interrogation was negative for any significant power changes, alarms or PI events/speed drops.  LVAD equipment check completed and is in good working order.  Back-up equipment present.    Length of Stay: 4  [name] 10/14/2014, 4:15 PM

## 2014-10-14 NOTE — Progress Notes (Signed)
Admitted 10/10/14 due to pre-VAD optimization.   HeartMate II LVAD implated on 10/13/14 by Dr. Cyndia Bent for DT VAD.   Vital signs: HR:  80 - 85 A-line:  103/79 (88)  O2 Sat: 98 - 100% Wt in lbs:   163 - 192   LVAD interrogation reveals:  Speed:  8800 Flow: 4.0 Power:  4.5 PI:  6.8 Alarms: 2 low flow (in OR yesterday evening) Events: 13 PI events since take back yesterday Fixed speed:  8800 Low speed limit:  8200  Drive Line:  Dressing intact with attachment device in place. Daily dressing changes.  Labs:  LDH trend: 376  INR trend:  2.2 > 1.58  WBC:  10 > 9.7 > 8.6  Hgb: 9.4 > 8.2 > 7.4 > 8.5  Co-Ox:  75  Blood products: 1st OR 10/13/14:  3 PCs, 5 FFP, 1 plt 2nd OR 10/13/14: 5 PCs, 4 FFP, 1 plt ICU:  4/4 - 10/14/14:  2 PCs, 1 plt, 2 cryo  Gtts: Epi 4 Levo 4 Mil .5  Nitric Oxide:  30 PPM   Plan/Recommendations as discussed with team: 1.  Wean Levo and keep MAPs 70 - 80 if possible. 2.  CT drainage overnight decreased from 150 - 200 cc/hr to 50 cc/hr after last cryo - continue to monitor. 3.  Allow pt to wake and assess neuro status.

## 2014-10-14 NOTE — Progress Notes (Signed)
INITIAL NUTRITION ASSESSMENT  DOCUMENTATION CODES Per approved criteria  -Not Applicable   INTERVENTION: If unable to extubate tomorrow, recommend initiating TF: Initiate Vital AF 1.2 @ 20 ml/hr via OGT and increase by 10 ml every 4 hours to goal rate of 65 ml/hr.   Tube feeding regimen will provide 1872 kcal (99% of needs), 117 grams of protein, and 1264 ml of H2O.   Monitor diet advancement/PO adequacy  NUTRITION DIAGNOSIS: Inadequate oral intake related to inability to eat  as evidenced by NPO/Vent status.   Goal: Pt to meet >/= 90% of their estimated nutrition needs  Monitor:  Vent status, diet advancement, PO intake, weight trend, labs  Reason for Assessment: Vent  54 y.o. 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   POD #1 S/P LVAD. Taken back to OR due to bleeding, now stabilized.   Per RN pt is stable and plan is to extubate patient tomorrow. OGT in place with bloody output. RN reports no bowel sounds yet- may take a little longer for bowels to wake up. No nutrition support at this time. Pt is about 30 lbs above his usually body weight per weight history.   Patient is currently intubated on ventilator support MV: 10 L/min Temp (24hrs), Avg:97.2 F (36.2 C), Min:95.2 F (35.1 C), Max:98.6 F (37 C)  Propofol: none  Labs: low sodium, low calcium, elevated AST, low hemoglobin   Height: Ht Readings from Last 1 Encounters:  10/12/14 5\' 6"  (1.676 m)    Weight: Wt Readings from Last 1 Encounters:  10/14/14 192 lb 0.3 oz (87.1 kg)    Ideal Body Weight: 142 lbs  % Ideal Body Weight: 135%  Wt Readings from Last 10 Encounters:  10/14/14 192 lb 0.3 oz (87.1 kg)  09/08/14 162 lb (73.483 kg)  09/08/14 162 lb (73.483 kg)  08/15/14 172 lb 12.8 oz (78.382 kg)  08/12/14 175 lb 9.6  oz (79.652 kg)  07/23/14 164 lb 8 oz (74.617 kg)  06/30/14 166 lb 4 oz (75.411 kg)  06/23/14 160 lb 4.4 oz (72.7 kg)  06/12/14 160 lb (72.576 kg)  11/21/13 174 lb (78.926 kg)    Usual Body Weight: 160-165 lbs  % Usual Body Weight: 116%  BMI:  Body mass index is 26.1 kg/(m^2) (Based on usual weight)  Estimated Nutritional Needs: Kcal: 1880 Protein: 120-140 grams Fluid: 2.5 L/day  Skin: closed chest incision, right abdominal incision  Diet Order: Diet NPO time specified  EDUCATION NEEDS: -Education not appropriate at this time   Intake/Output Summary (Last 24 hours) at 10/14/14 1135 Last data filed at 10/14/14 1100  Gross per 24 hour  Intake 12567.93 ml  Output   7285 ml  Net 5282.93 ml    Last BM: 4/1  Labs:   Recent Labs Lab 10/13/14 0343  10/13/14 1218  10/13/14 1640 10/13/14 1830 10/13/14 1835 10/14/14 0245  NA 129*  < > 131*  < > 134*  --  134* 132*  K 4.4  < > 3.5  < > 3.7  --  3.3* 4.5  CL 98  < > 93*  --  107  --   --  104  CO2 22  --   --   --  22  --   --  24  BUN 17  < > 14  --  11  --   --  10  CREATININE 1.30  < > 0.90  --  0.88 1.01  --  0.92  CALCIUM 8.7  --   --   --  6.8*  --   --  7.0*  MG  --   --   --   --   --  1.6  --  2.4  PHOS  --   --   --   --   --   --   --  2.5  GLUCOSE 110*  < > 145*  < > 140*  --  134* 127*  < > = values in this interval not displayed.  CBG (last 3)   Recent Labs  10/14/14 0403 10/14/14 0500 10/14/14 0600  GLUCAP 117* 118* 130*    Scheduled Meds: . sodium chloride   Intravenous Once  . sodium chloride   Intravenous Once  . sodium chloride   Intravenous Once  . acetaminophen  1,000 mg Oral 4 times per day   Or  . acetaminophen (TYLENOL) oral liquid 160 mg/5 mL  1,000 mg Per Tube 4 times per day  . antiseptic oral rinse  7 mL Mouth Rinse QID  . aspirin EC  325 mg Oral Daily   Or  . aspirin  324 mg Per Tube Daily   Or  . aspirin  300 mg Rectal Daily  . atorvastatin  20 mg Oral QHS  .  bisacodyl  10 mg Oral Daily   Or  . bisacodyl  10 mg Rectal Daily  . cefUROXime (ZINACEF)  IV  1.5 g Intravenous Q12H  . chlorhexidine  15 mL Mouth Rinse BID  . digoxin  0.125 mg Oral Daily  . docusate sodium  200 mg Oral Daily  . insulin regular  0-10 Units Intravenous TID WC  . magnesium oxide  400 mg Oral Q breakfast  . [START ON 10/15/2014] pantoprazole  40 mg Oral Daily  . sodium chloride  3 mL Intravenous Q12H  . vancomycin  1,000 mg Intravenous Q12H    Continuous Infusions: . sodium chloride    . sodium chloride 250 mL (10/14/14 0644)  . sodium chloride Stopped (10/13/14 1854)  . dexmedetomidine 0.399 mcg/kg/hr (10/14/14 1100)  . EPINEPHrine 4 mg in dextrose 5% 250 mL infusion (16 mcg/mL) 4 mcg/min (10/14/14 1100)  . insulin (NOVOLIN-R) infusion 7.4 Units/hr (10/14/14 1100)  . lactated ringers 20 mL/hr at 10/14/14 1100  . lactated ringers 20 mL/hr at 10/14/14 1100  . milrinone 0.499 mcg/kg/min (10/14/14 1100)  . nitroGLYCERIN    . norepinephrine (LEVOPHED) Adult infusion 1 mcg/min (10/14/14 1100)  . phenylephrine (NEO-SYNEPHRINE) Adult infusion      Past Medical History  Diagnosis Date  . Paroxysmal ventricular tachycardia 2005, 2010    s/p AICD '05, replaced w/ St. Jude's in 2010 (PVT/V.Fib arrest requiring ICD implant in 2005)  . HYPERTENSION, UNSPECIFIED   . HYPERLIPIDEMIA-MIXED   . 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 Nonischemic cardiomyopathy     2/2 Adriamycin administration for lymphoma  . CHF (congestive heart failure)     EF 15% by echo 2009; s/p St. Jude ICD  . Diabetes mellitus     Type 2  . Cancer 1992, 2007     non hodkins lymphoma 1992, Hodgkins 2007  . Depression   . LV (left ventricular) mural thrombus 2009    2009, on coumadin  . Small bowel obstruction  2001    s/p Small bowel resection 2001  . Jejunal intussusception 2008    2008   . 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 Splenic mass 2009    Noted on Abd [place] 2009 w/ recs for f/u CT - not done  . Hypotension   . Noncompliance with medication regimen   . AICD (automatic cardioverter/defibrillator) present 2005, 2010  . Presence of permanent cardiac pacemaker     Past Surgical History  Procedure Laterality Date  . Cardiac defibrillator placement      2005, replaced w/ St. Jude's 2010  . Vasectomy    . Bowel resection      small bowell 2/2 obstruction 2001  . Pacemaker insertion    . Insert / replace / remove pacemaker    . Colonoscopy N/A 09/18/2014    Procedure: COLONOSCOPY;  Surgeon: [name], MD;  Location: [facility] ENDOSCOPY;  Service: Endoscopy;  Laterality: N/A;  . Esophagogastroduodenoscopy N/A 09/18/2014    Procedure: ESOPHAGOGASTRODUODENOSCOPY (EGD);  Surgeon: [name], MD;  Location: [facility] ENDOSCOPY;  Service: Endoscopy;  Laterality: N/A;  . Left and right heart catheterization with coronary angiogram N/A 09/19/2014    Procedure: LEFT AND RIGHT HEART CATHETERIZATION WITH CORONARY ANGIOGRAM;  Surgeon: Jolaine Artist, MD;  Location: [facility] CATH LAB;  Service: Cardiovascular;  Laterality: N/A;  . Colonoscopy N/A 09/19/2014    Procedure: COLONOSCOPY;  Surgeon: [name], MD;  Location: [place];  Service: Gastroenterology;  Laterality: N/A;    Pryor Ochoa RD, LDN Inpatient Clinical Dietitian Pager: [phone] After Hours Pager: [phone]

## 2014-10-15 ENCOUNTER — Inpatient Hospital Stay (HOSPITAL_COMMUNITY): Payer: Medicare Other

## 2014-10-15 LAB — GLUCOSE, CAPILLARY
GLUCOSE-CAPILLARY: 101 mg/dL — AB (ref 70–99)
GLUCOSE-CAPILLARY: 116 mg/dL — AB (ref 70–99)
GLUCOSE-CAPILLARY: 127 mg/dL — AB (ref 70–99)
GLUCOSE-CAPILLARY: 127 mg/dL — AB (ref 70–99)
GLUCOSE-CAPILLARY: 131 mg/dL — AB (ref 70–99)
GLUCOSE-CAPILLARY: 133 mg/dL — AB (ref 70–99)
GLUCOSE-CAPILLARY: 157 mg/dL — AB (ref 70–99)
GLUCOSE-CAPILLARY: 86 mg/dL (ref 70–99)
GLUCOSE-CAPILLARY: 90 mg/dL (ref 70–99)
GLUCOSE-CAPILLARY: 94 mg/dL (ref 70–99)
Glucose-Capillary: 129 mg/dL — ABNORMAL HIGH (ref 70–99)
Glucose-Capillary: 135 mg/dL — ABNORMAL HIGH (ref 70–99)
Glucose-Capillary: 142 mg/dL — ABNORMAL HIGH (ref 70–99)
Glucose-Capillary: 119 mg/dL — ABNORMAL HIGH (ref 70–99)
Glucose-Capillary: 122 mg/dL — ABNORMAL HIGH (ref 70–99)
Glucose-Capillary: 117 mg/dL — ABNORMAL HIGH (ref 70–99)
Glucose-Capillary: 117 mg/dL — ABNORMAL HIGH (ref 70–99)
Glucose-Capillary: 110 mg/dL — ABNORMAL HIGH (ref 70–99)
Glucose-Capillary: 109 mg/dL — ABNORMAL HIGH (ref 70–99)
Glucose-Capillary: 135 mg/dL — ABNORMAL HIGH (ref 70–99)
Glucose-Capillary: 126 mg/dL — ABNORMAL HIGH (ref 70–99)
Glucose-Capillary: 104 mg/dL — ABNORMAL HIGH (ref 70–99)
Glucose-Capillary: 96 mg/dL (ref 70–99)
Glucose-Capillary: 92 mg/dL (ref 70–99)
Glucose-Capillary: 89 mg/dL (ref 70–99)
Glucose-Capillary: 101 mg/dL — ABNORMAL HIGH (ref 70–99)
Glucose-Capillary: 112 mg/dL — ABNORMAL HIGH (ref 70–99)
Glucose-Capillary: 136 mg/dL — ABNORMAL HIGH (ref 70–99)

## 2014-10-15 LAB — POCT I-STAT 3, ART BLOOD GAS (G3+)
ACID-BASE DEFICIT: 3 mmol/L — AB (ref 0.0–2.0)
ACID-BASE DEFICIT: 3 mmol/L — AB (ref 0.0–2.0)
ACID-BASE DEFICIT: 1 mmol/L (ref 0.0–2.0)
Acid-Base Excess: 4 mmol/L — ABNORMAL HIGH (ref 0.0–2.0)
BICARBONATE: 24 meq/L (ref 20.0–24.0)
BICARBONATE: 27.3 meq/L — AB (ref 20.0–24.0)
Bicarbonate: 22.3 mEq/L (ref 20.0–24.0)
Bicarbonate: 22.5 mEq/L (ref 20.0–24.0)
O2 SAT: 99 %
O2 SAT: 100 %
O2 SAT: 100 %
O2 Saturation: 99 %
PCO2 ART: 37.2 mmHg (ref 35.0–45.0)
PH ART: 7.473 — AB (ref 7.350–7.450)
PO2 ART: 169 mmHg — AB (ref 80.0–100.0)
PO2 ART: 200 mmHg — AB (ref 80.0–100.0)
Patient temperature: 37
Patient temperature: 36.9
Patient temperature: 36.9
TCO2: 23 mmol/L (ref 0–100)
TCO2: 24 mmol/L (ref 0–100)
TCO2: 25 mmol/L (ref 0–100)
TCO2: 28 mmol/L (ref 0–100)
pCO2 arterial: 39.2 mmHg (ref 35.0–45.0)
pCO2 arterial: 38.7 mmHg (ref 35.0–45.0)
pCO2 arterial: 37.5 mmHg (ref 35.0–45.0)
pH, Arterial: 7.366 (ref 7.350–7.450)
pH, Arterial: 7.372 (ref 7.350–7.450)
pH, Arterial: 7.413 (ref 7.350–7.450)
pO2, Arterial: 165 mmHg — ABNORMAL HIGH (ref 80.0–100.0)
pO2, Arterial: 157 mmHg — ABNORMAL HIGH (ref 80.0–100.0)

## 2014-10-15 LAB — POCT I-STAT, CHEM 8
BUN: 6 mg/dL (ref 6–23)
Calcium, Ion: 1.19 mmol/L (ref 1.12–1.23)
Chloride: 100 mmol/L (ref 96–112)
Creatinine, Ser: 0.8 mg/dL (ref 0.50–1.35)
Glucose, Bld: 104 mg/dL — ABNORMAL HIGH (ref 70–99)
HEMATOCRIT: 27 % — AB (ref 39.0–52.0)
HEMOGLOBIN: 9.2 g/dL — AB (ref 13.0–17.0)
POTASSIUM: 4 mmol/L (ref 3.5–5.1)
SODIUM: 135 mmol/L (ref 135–145)
TCO2: 21 mmol/L (ref 0–100)

## 2014-10-15 LAB — POCT I-STAT 7, (LYTES, BLD GAS, ICA,H+H)
ACID-BASE DEFICIT: 3 mmol/L — AB (ref 0.0–2.0)
Bicarbonate: 23.1 mEq/L (ref 20.0–24.0)
CALCIUM ION: 1.01 mmol/L — AB (ref 1.12–1.23)
HCT: 18 % — ABNORMAL LOW (ref 39.0–52.0)
Hemoglobin: 6.1 g/dL — CL (ref 13.0–17.0)
O2 SAT: 100 %
PH ART: 7.299 — AB (ref 7.350–7.450)
POTASSIUM: 3.7 mmol/L (ref 3.5–5.1)
Patient temperature: 37
SODIUM: 137 mmol/L (ref 135–145)
TCO2: 25 mmol/L (ref 0–100)
pCO2 arterial: 47.1 mmHg — ABNORMAL HIGH (ref 35.0–45.0)
pO2, Arterial: 441 mmHg — ABNORMAL HIGH (ref 80.0–100.0)

## 2014-10-15 LAB — COMPREHENSIVE METABOLIC PANEL
ALK PHOS: 62 U/L (ref 39–117)
ALT: 53 U/L (ref 0–53)
AST: 124 U/L — ABNORMAL HIGH (ref 0–37)
Albumin: 2.8 g/dL — ABNORMAL LOW (ref 3.5–5.2)
Anion gap: 4 — ABNORMAL LOW (ref 5–15)
BUN: 5 mg/dL — ABNORMAL LOW (ref 6–23)
CHLORIDE: 105 mmol/L (ref 96–112)
CO2: 24 mmol/L (ref 19–32)
Calcium: 8.2 mg/dL — ABNORMAL LOW (ref 8.4–10.5)
Creatinine, Ser: 0.82 mg/dL (ref 0.50–1.35)
Glucose, Bld: 98 mg/dL (ref 70–99)
Potassium: 4 mmol/L (ref 3.5–5.1)
Sodium: 133 mmol/L — ABNORMAL LOW (ref 135–145)
Total Bilirubin: 4.3 mg/dL — ABNORMAL HIGH (ref 0.3–1.2)
Total Protein: 4.8 g/dL — ABNORMAL LOW (ref 6.0–8.3)

## 2014-10-15 LAB — PREPARE CRYOPRECIPITATE
Unit division: 0
Unit division: 0

## 2014-10-15 LAB — LACTATE DEHYDROGENASE: LDH: 417 U/L — ABNORMAL HIGH (ref 94–250)

## 2014-10-15 LAB — CBC WITH DIFFERENTIAL/PLATELET
BASOS ABS: 0 10*3/uL (ref 0.0–0.1)
BASOS PCT: 0 % (ref 0–1)
EOS ABS: 0.2 10*3/uL (ref 0.0–0.7)
EOS PCT: 2 % (ref 0–5)
HEMATOCRIT: 27.9 % — AB (ref 39.0–52.0)
Hemoglobin: 9.8 g/dL — ABNORMAL LOW (ref 13.0–17.0)
LYMPHS ABS: 1 10*3/uL (ref 0.7–4.0)
Lymphocytes Relative: 8 % — ABNORMAL LOW (ref 12–46)
MCH: 28.4 pg (ref 26.0–34.0)
MCHC: 35.1 g/dL (ref 30.0–36.0)
MCV: 80.9 fL (ref 78.0–100.0)
MONO ABS: 1.1 10*3/uL — AB (ref 0.1–1.0)
Monocytes Relative: 8 % (ref 3–12)
Neutro Abs: 10.6 10*3/uL — ABNORMAL HIGH (ref 1.7–7.7)
Neutrophils Relative %: 82 % — ABNORMAL HIGH (ref 43–77)
PLATELETS: 63 10*3/uL — AB (ref 150–400)
RBC: 3.45 MIL/uL — AB (ref 4.22–5.81)
RDW: 15.9 % — ABNORMAL HIGH (ref 11.5–15.5)
WBC: 13 10*3/uL — ABNORMAL HIGH (ref 4.0–10.5)

## 2014-10-15 LAB — POCT I-STAT 4, (NA,K, GLUC, HGB,HCT)
Glucose, Bld: 144 mg/dL — ABNORMAL HIGH (ref 70–99)
HCT: 29 % — ABNORMAL LOW (ref 39.0–52.0)
HEMOGLOBIN: 9.9 g/dL — AB (ref 13.0–17.0)
Potassium: 4.7 mmol/L (ref 3.5–5.1)
Sodium: 134 mmol/L — ABNORMAL LOW (ref 135–145)

## 2014-10-15 LAB — PROTIME-INR
INR: 1.41 (ref 0.00–1.49)
Prothrombin Time: 17.4 seconds — ABNORMAL HIGH (ref 11.6–15.2)

## 2014-10-15 LAB — CARBOXYHEMOGLOBIN
Carboxyhemoglobin: 1.1 % (ref 0.5–1.5)
Methemoglobin: 1.2 % (ref 0.0–1.5)
O2 Saturation: 78.7 %
TOTAL HEMOGLOBIN: 9.2 g/dL — AB (ref 13.5–18.0)

## 2014-10-15 LAB — POCT I-STAT GLUCOSE
GLUCOSE: 132 mg/dL — AB (ref 70–99)
OPERATOR ID: 333012

## 2014-10-15 LAB — PHOSPHORUS: Phosphorus: 2.8 mg/dL (ref 2.3–4.6)

## 2014-10-15 LAB — MAGNESIUM: Magnesium: 1.9 mg/dL (ref 1.5–2.5)

## 2014-10-15 MED ORDER — ASPIRIN 81 MG PO CHEW: 81.0000 mg | CHEWABLE_TABLET | Freq: Every day | ORAL | Status: DC

## 2014-10-15 MED ORDER — AMIODARONE HCL IN DEXTROSE 360-4.14 MG/200ML-% IV SOLN
60.0000 mg/h | INTRAVENOUS | Status: AC
  Administered 2014-10-15: 60 mg/h via INTRAVENOUS
  Administered 2014-10-15: 200 mL via INTRAVENOUS

## 2014-10-15 MED ORDER — AMIODARONE HCL IN DEXTROSE 360-4.14 MG/200ML-% IV SOLN
30.0000 mg/h | INTRAVENOUS | Status: DC
  Administered 2014-10-16 – 2014-10-17 (×4): 30 mg/h via INTRAVENOUS
  Filled 2014-10-15 (×11): qty 200

## 2014-10-15 MED ORDER — DIGOXIN 0.25 MG/ML IJ SOLN
0.2500 mg | Freq: Once | INTRAMUSCULAR | Status: DC
  Filled 2014-10-15 (×2): qty 1

## 2014-10-15 MED ORDER — POTASSIUM CHLORIDE 10 MEQ/50ML IV SOLN
10.0000 meq | INTRAVENOUS | Status: AC
  Administered 2014-10-15 (×3): 10 meq via INTRAVENOUS
  Filled 2014-10-15 (×3): qty 50

## 2014-10-15 MED ORDER — GUAIFENESIN ER 600 MG PO TB12
1200.0000 mg | ORAL_TABLET | Freq: Two times a day (BID) | ORAL | Status: DC
  Administered 2014-10-15 – 2014-10-18 (×7): 1200 mg via ORAL
  Filled 2014-10-15 (×10): qty 2

## 2014-10-15 MED ORDER — LEVALBUTEROL HCL 0.63 MG/3ML IN NEBU: 0.6300 mg | INHALATION_SOLUTION | Freq: Four times a day (QID) | RESPIRATORY_TRACT | Status: DC | PRN

## 2014-10-15 MED ORDER — INSULIN DETEMIR 100 UNIT/ML ~~LOC~~ SOLN: 15.0000 [IU] | Freq: Two times a day (BID) | SUBCUTANEOUS | Status: DC

## 2014-10-15 MED ORDER — AMIODARONE HCL IN DEXTROSE 360-4.14 MG/200ML-% IV SOLN
INTRAVENOUS | Status: AC
  Administered 2014-10-15: 200 mL via INTRAVENOUS
  Filled 2014-10-15: qty 200

## 2014-10-15 MED ORDER — ASPIRIN 300 MG RE SUPP: 150.0000 mg | Freq: Every day | RECTAL | Status: DC

## 2014-10-15 MED ORDER — FUROSEMIDE 10 MG/ML IJ SOLN
40.0000 mg | Freq: Two times a day (BID) | INTRAMUSCULAR | Status: DC
  Administered 2014-10-15: 40 mg via INTRAVENOUS
  Filled 2014-10-15: qty 4

## 2014-10-15 MED ORDER — AMIODARONE LOAD VIA INFUSION
150.0000 mg | Freq: Once | INTRAVENOUS | Status: AC
  Administered 2014-10-15: 150 mg via INTRAVENOUS

## 2014-10-15 MED ORDER — ALBUMIN HUMAN 5 % IV SOLN
INTRAVENOUS | Status: AC
  Filled 2014-10-15: qty 250

## 2014-10-15 MED ORDER — ENOXAPARIN SODIUM 40 MG/0.4ML ~~LOC~~ SOLN
40.0000 mg | Freq: Every day | SUBCUTANEOUS | Status: DC
  Administered 2014-10-15 – 2014-10-23 (×9): 40 mg via SUBCUTANEOUS
  Filled 2014-10-15 (×11): qty 0.4

## 2014-10-15 MED ORDER — INSULIN ASPART 100 UNIT/ML ~~LOC~~ SOLN
0.0000 [IU] | SUBCUTANEOUS | Status: DC
  Administered 2014-10-15 – 2014-10-16 (×9): 2 [IU] via SUBCUTANEOUS

## 2014-10-15 MED ORDER — INSULIN DETEMIR 100 UNIT/ML ~~LOC~~ SOLN
15.0000 [IU] | Freq: Two times a day (BID) | SUBCUTANEOUS | Status: DC
  Administered 2014-10-15 – 2014-10-17 (×5): 15 [IU] via SUBCUTANEOUS
  Filled 2014-10-15 (×8): qty 0.15

## 2014-10-15 MED ORDER — FUROSEMIDE 10 MG/ML IJ SOLN
20.0000 mg | Freq: Two times a day (BID) | INTRAMUSCULAR | Status: AC
  Administered 2014-10-15: 20 mg via INTRAVENOUS
  Filled 2014-10-15: qty 2

## 2014-10-15 MED ORDER — ASPIRIN EC 81 MG PO TBEC: 81.0000 mg | DELAYED_RELEASE_TABLET | Freq: Every day | ORAL | Status: DC

## 2014-10-15 MED ORDER — ALBUMIN HUMAN 5 % IV SOLN
12.5000 g | Freq: Once | INTRAVENOUS | Status: AC
  Administered 2014-10-15: 12.5 g via INTRAVENOUS

## 2014-10-15 NOTE — Progress Notes (Signed)
OT Cancellation Note  Patient Details Name: Andre Tran MRN: [id number] DOB: 1960-07-12   Cancelled Treatment:    Reason Eval/Treat Not Completed: Medical issues which prohibited therapy (Pt with elevated HR, RN requesting to defer at this time.) Will continue to follow.  [name] 10/15/2014, 3:43 PM

## 2014-10-15 NOTE — Progress Notes (Signed)
Admitted 10/10/14 due to pre-VAD optimization.   HeartMate II LVAD implated on 10/13/14 by Dr. Cyndia Bent for DT VAD.   Vital signs: Temp: 97.5 - 100.0 F HR:  95 - 100 A-line:  70 - 80 O2 Sat: 100 Wt in lbs:   163 > 192 > 189   LVAD interrogation reveals:  Speed:  8800 Flow: 4.1 Power:  4.6 PI:  5.9 Alarms:  none  Events: 10 PI events Fixed speed:  8800 Low speed limit:  8200  Drive Line:  Dressing intact with attachment device in place. Daily dressing changes.  Labs:  LDH trend: 376 > 417  INR trend:  2.2 > 1.58 > 1.41  WBC:  10 > 9.7 > 8.6 > 10.6 > 13.0  Hgb: 9.4 > 8.2 > 7.4 > 8.5 > 9.7 > 9.8  Co-Ox:  75 > 78  Blood products: 1st OR 10/13/14:  3 PCs, 5 FFP, 1 plt 2nd OR 10/13/14: 5 PCs, 4 FFP, 1 plt ICU:  4/4 - 10/14/14:  2 PCs, 1 plt, 2 cryo  Gtts: Epi 3 Levo: off 10/15/14  Mil .3  Nitric Oxide:  3 PPM   Plan/Recommendations as discussed with team: 1.  Pt awake, alert, will wean vent and extubate today if possible. 2.  Wife requested HM II Handbook - will start reviewing for educational purposes.

## 2014-10-15 NOTE — Progress Notes (Signed)
Dr Darcey Nora at bedside, all patient and VAD number look good, levophed off since 0000. MAPs 80-89 orders received to wean Epi to 3.

## 2014-10-15 NOTE — Progress Notes (Signed)
Nitric weaned and turned off per RN stating verbal order from Dr. Cyndia Bent.

## 2014-10-15 NOTE — Progress Notes (Addendum)
Patient ID: Andre Tran, male   DOB: Dec 05, 1960, 54 y.o.   MRN: [id number] HeartMate 2 Rounding Note  Subjective:    POD 2 s/p LVAD and takeback for bleeding from the chest wall. His RV function look good on pre-bypass TEE and post-bypass TEE.  He is on milrinone 0.5, epi 3, NO 3 ppm. Levophed is off NO weaned since MN without problem Received 40 mg lasix this am and has diuresed over 1L so far with drop in CVP from 15 to 9 and drop in PI  CI 2.8 Co-ox 78 PA 26/14 CVP 9    LVAD INTERROGATION:  HeartMate II LVAD:  Flow 3.5 liters/min, speed 8800, power 4.3, PI 4.3.    Objective:    Vital Signs:   Temp:  [97.9 F (36.6 C)-100 F (37.8 C)] 99 F (37.2 C) (04/06 0700) Pulse Rate:  [88-102] 102 (04/06 0700) Resp:  [11-26] 20 (04/06 0700) BP: (91-92)/(73-75) 91/73 mmHg (04/06 0320) SpO2:  [100 %] 100 % (04/06 0700) Arterial Line BP: (65-115)/(52-78) 92/78 mmHg (04/06 0700) FiO2 (%):  [40 %] 40 % (04/06 0800) Weight:  [85.9 kg (189 lb 6 oz)] 85.9 kg (189 lb 6 oz) (04/06 0500) Last BM Date: 10/10/14 Mean arterial Pressure 75-90  Intake/Output:   Intake/Output Summary (Last 24 hours) at 10/15/14 0845 Last data filed at 10/15/14 0830  Gross per 24 hour  Intake 3260.65 ml  Output   4460 ml  Net -1199.35 ml     Physical Exam: General:  Looks comfortable on vent. Still sedated with Precedex HEENT: normal Cor: Distanty sounds with LVAD hum present. Lungs: clear Abdomen: soft, nontender, nondistended. No hepatosplenomegaly. No bruits or masses. Good bowel sounds. Extremities: moderate edema Neuro: alert , moves all 4 extremities w/o difficulty.   Telemetry: sinus 103  Labs: Basic Metabolic Panel:  Recent Labs Lab 10/13/14 0343  10/13/14 1218  10/13/14 1640 10/13/14 1830 10/13/14 1835 10/14/14 0245 10/14/14 1700 10/14/14 1715 10/15/14 0425  NA 129*  < > 131*  < > 134*  --  134* 132*  --  132* 133*  K 4.4  < > 3.5  < > 3.7  --  3.3* 4.5  --  3.9 4.0  CL 98  <  > 93*  --  107  --   --  104  --  104 105  CO2 22  --   --   --  22  --   --  24  --  21 24  GLUCOSE 110*  < > 145*  < > 140*  --  134* 127*  --  95 98  BUN 17  < > 14  --  11  --   --  10  --  7 5*  CREATININE 1.30  < > 0.90  --  0.88 1.01  --  0.92 0.86 0.91 0.82  CALCIUM 8.7  --   --   --  6.8*  --   --  7.0*  --  7.6* 8.2*  MG  --   --   --   --   --  1.6  --  2.4 2.0  --  1.9  PHOS  --   --   --   --   --   --   --  2.5  --   --  2.8  < > = values in this interval not displayed.  Liver Function Tests:  Recent Labs Lab 10/10/14 1510 10/12/14 0946 10/14/14 0245 10/14/14  1715 10/15/14 0425  AST 19 31 105* 133* 124*  ALT 16 27 36 47 53  ALKPHOS 76 78 36* 46 62  BILITOT 1.1 0.7 4.1* 4.8* 4.3*  PROT 6.7 6.8 4.9* 4.7* 4.8*  ALBUMIN 3.7 3.4* 3.3* 3.0* 2.8*   No results for input(s): LIPASE, AMYLASE in the last 168 hours. No results for input(s): AMMONIA in the last 168 hours.  CBC:  Recent Labs Lab 10/10/14 1510  10/13/14 1640 10/13/14 1830 10/13/14 1835 10/14/14 0245 10/14/14 1700 10/15/14 0425  WBC 6.8  < > 10.0 9.7  --  8.6 10.6* 13.0*  NEUTROABS 4.4  --   --   --   --  7.0  --  10.6*  HGB 9.2*  < > 8.4* 10.9* 10.2* 8.5* 9.7* 9.8*  HCT 26.9*  < > 24.2* 30.9* 30.0* 23.7* 26.8* 27.9*  MCV 87.3  < > 86.7 85.6  --  82.3 80.5 80.9  PLT 274  < > 61*  61* 59*  --  96* 60* 63*  < > = values in this interval not displayed.  INR:  Recent Labs Lab 10/12/14 0610 10/13/14 1455 10/13/14 1640 10/14/14 0245 10/15/14 0425  INR 1.20 1.55* NOT CALCULATED  2.22* 1.58* 1.41   LDH 376>417  Other results:  EKG:   Imaging: Dg Chest Port 1 View  10/15/2014   CLINICAL DATA:  Left ventricular assist device, followup  EXAM: PORTABLE CHEST - 1 VIEW  COMPARISON:  Portable chest x-ray of 10/14/2014  FINDINGS: The tip of the endotracheal tube is approximately 4.2 cm above the carina. Bilateral chest tubes remain and no pneumothorax is seen. Swan-Ganz catheter tip is in the left  main pulmonary artery, and cardiomegaly is stable. Very mild pulmonary vascular congestion remains, perhaps slightly improved. Left ventricular assist device is only partially imaged.  IMPRESSION: 1. Perhaps slight improvement in mild pulmonary vascular congestion. 2. Endotracheal tube tip 4.2 cm above the carina. 3. Bilateral chest tubes. No pneumothorax. The left ventricular assist device is only partially visualized.   Electronically Signed   By: Ivar Drape M.D.   On: 10/15/2014 07:54   Dg Chest Port 1 View  10/14/2014   CLINICAL DATA:  Post LEFT ventricular assist device placement  EXAM: PORTABLE CHEST - 1 VIEW  COMPARISON:  Portable exam 0649 hours compared to 10/13/2014  FINDINGS: Tip of endotracheal tube projects 4.1 cm above carina.  Nasogastric tube extends into stomach.  RIGHT jugular Swan-Ganz catheter tip projects over pulmonary artery bifurcation.  Additional RIGHT jugular line with tip projecting over SVC.  Mediastinal drain and BILATERAL thoracostomy tubes present.  Superior margin of a LEFT ventricular assist device is is identified.  Enlargement of cardiac silhouette.  Stable mediastinal contours with a slight pulmonary vascular congestion.  Question minimal perihilar edema.  No pneumothorax.  IMPRESSION: Line and tube placements as above.  Suspect minimal pulmonary edema.   Electronically Signed   By: Lavonia Dana M.D.   On: 10/14/2014 07:52   Dg Chest Port 1 View  10/13/2014   CLINICAL DATA:  Check endotracheal tube position.  EXAM: PORTABLE CHEST - 1 VIEW  COMPARISON:  10/13/2014  FINDINGS: Endotracheal tube is in good position, tip at the clavicular heads. The gastric suction tube reaches the stomach at least.  Shortening of Swan-Ganz catheter from the right, tip at the main pulmonary artery level. Right IJ central line, tip at the upper right atrium. Thoracic drains are in similar position. Grossly unchanged positioning of a left ventricular  assist device, partially imaged currently. No  interval displacement of dual-chamber ICD/ pacer leads.  Improved lung volumes . No evidence of air leak or pleural effusion. Stable cardiopericardial enlargement.  IMPRESSION: 1. Unremarkable positioning of tubes and lines, as above. 2. Improved retrocardiac aeration.   Electronically Signed   By: Monte Fantasia M.D.   On: 10/13/2014 19:59   Dg Chest Port 1 View  10/13/2014   CLINICAL DATA:  Left ventricular assist device placement  EXAM: PORTABLE CHEST - 1 VIEW  COMPARISON:  October 11, 2014  FINDINGS: There is a left ventricular assist device present. Endotracheal tube tip is 4.6 cm above the carina. Swan-Ganz catheter tip is in the right main pulmonary outflow tract. There is a nasogastric tube with the tip and side-port in the stomach. There are bilateral chest tubes as well as mediastinal drains. There is a central catheter with tip in superior vena cava near the cavoatrial junction. Pacemaker lead tips are attached to the right atrium and right ventricle. No pneumothorax.  There is left lower lobe volume loss. Lungs elsewhere clear. Heart is borderline prominent with pulmonary vascularity within normal limits. No adenopathy. No bone lesions.  IMPRESSION: Tube and catheter positions as described without pneumothorax. Left lower lobe volume loss, probably atelectatic. Lungs elsewhere clear. Left ventricular assist device present.   Electronically Signed   By: Lowella Grip III M.D.   On: 10/13/2014 15:05      Medications:     Scheduled Medications: . acetaminophen  1,000 mg Oral 4 times per day   Or  . acetaminophen (TYLENOL) oral liquid 160 mg/5 mL  1,000 mg Per Tube 4 times per day  . antiseptic oral rinse  7 mL Mouth Rinse QID  . atorvastatin  20 mg Oral QHS  . bisacodyl  10 mg Oral Daily   Or  . bisacodyl  10 mg Rectal Daily  . chlorhexidine  15 mL Mouth Rinse BID  . digoxin  0.125 mg Oral Daily  . docusate sodium  200 mg Oral Daily  . enoxaparin (LOVENOX) injection  40 mg  Subcutaneous QHS  . furosemide  40 mg Intravenous BID  . insulin aspart  0-24 Units Subcutaneous 6 times per day  . insulin detemir  15 Units Subcutaneous BID  . insulin detemir  15 Units Subcutaneous BID  . insulin regular  0-10 Units Intravenous TID WC  . magnesium oxide  400 mg Oral Q breakfast  . pantoprazole  40 mg Oral Daily  . potassium chloride  10 mEq Intravenous Q1 Hr x 3  . sodium chloride  3 mL Intravenous Q12H     Infusions: . sodium chloride    . sodium chloride 250 mL (10/14/14 0644)  . sodium chloride Stopped (10/13/14 1854)  . dexmedetomidine 0.3 mcg/kg/hr (10/15/14 0700)  . EPINEPHrine 4 mg in dextrose 5% 250 mL infusion (16 mcg/mL) 3 mcg/min (10/15/14 0700)  . insulin (NOVOLIN-R) infusion 1.6 Units/hr (10/15/14 0700)  . lactated ringers 20 mL/hr at 10/15/14 0700  . lactated ringers 20 mL/hr at 10/15/14 0700  . milrinone 0.3 mcg/kg/min (10/15/14 0825)  . nitroGLYCERIN    . norepinephrine (LEVOPHED) Adult infusion Stopped (10/15/14 0100)  . phenylephrine (NEO-SYNEPHRINE) Adult infusion       PRN Medications:  sodium chloride, albuterol, bisacodyl, midazolam, morphine injection, ondansetron (ZOFRAN) IV, oxyCODONE, sodium chloride, traMADol   Assessment:   1. POD #1 S/P LVAD and reexploration for bleeding 2. Acute Blood Lossanemia. Hgb stable 3. Chronic Systolic Heart Failure with  preop echo showing EF 20%, no LV thrombus, moderately dilated RV with mildly decreased systolic function, mild MR, moderate TR. chemo induced adriamycin. 4. H/O LV thrombus- on coumadin preop 5. Non Hodgkins Lymphoma 6. H/O CVA 2008  7. H/O VT/VF --St Jude ICD 2010  8. DMII 9. Postop thrombocytopenia  Plan/Discussion:    He has been hemodynamically stable overnight with weaning NO. Everything looks good this am. His PI has dropped with large diuresis with 40 mg lasix and CVP has dropped so will give some albumin and slow diuresis.  Repleat K+ with  diuresis.  Continue to wean NO and wean vent to extubate to nasal NO. Once extubated and stable can consider weaning milrinone.  Minimal MT output so will remove. Continue pleural tubes and pocket drain.  INR 1.4 and plt ct 63. Will wait until tomorrow to start coumadin. Lovenox for DVT prophylaxis. Hold off on ASA with thrombocytopenia.    I reviewed the LVAD parameters from today, and compared the results to the patient's prior recorded data.  No programming changes were made.  The LVAD is functioning within specified parameters.  The patient performs LVAD self-test daily.  LVAD interrogation was negative for any significant power changes, alarms or PI events/speed drops.  LVAD equipment check completed and is in good working order.  Back-up equipment present.   LVAD education done on emergency procedures and precautions and reviewed exit site care.  Length of Stay: 5  [name] 10/15/2014, 8:45 AM

## 2014-10-15 NOTE — Progress Notes (Addendum)
HeartMate 2 Rounding Note  Subjective:    POD #21 S/P LVAD. Taken back to OR due to chest wall bleeding, now stabilized. Has received total of 8 UPRBCs.   On milrinone 0.5 mcg, Epi 3 mcg, and levo off. Nigtric weaned down to 3.    Hemoglobin down from 10.9>8.5>9.8 .   CVP 15, PA 31/19, CI 2.8  CO-OX 78%  LDH 376> 417  LVAD INTERROGATION:  HeartMate II LVAD:  Flow 4.1  liters/min, speed 8800, power 5, PI 5.5, no PI events   Objective:    Vital Signs:   Temp:  [97.5 F (36.4 C)-100 F (37.8 C)] 99 F (37.2 C) (04/06 0700) Pulse Rate:  [83-102] 102 (04/06 0700) Resp:  [11-26] 20 (04/06 0700) BP: (91-92)/(73-75) 91/73 mmHg (04/06 0320) SpO2:  [100 %] 100 % (04/06 0700) Arterial Line BP: (65-115)/(52-79) 92/78 mmHg (04/06 0700) FiO2 (%):  [40 %] 40 % (04/06 0700) Weight:  [189 lb 6 oz (85.9 kg)] 189 lb 6 oz (85.9 kg) (04/06 0500) Last BM Date: 10/10/14 Mean arterial Pressure 84   Intake/Output:   Intake/Output Summary (Last 24 hours) at 10/15/14 0716 Last data filed at 10/15/14 0700  Gross per 24 hour  Intake 3669.45 ml  Output   4515 ml  Net -845.55 ml     Physical Exam: General:  Intubated. Sedated.  HEENT: normal Neck: supple. JVP to jaw. Carotids 2+ bilat; no bruits. No lymphadenopathy or thryomegaly appreciated. RIJ  swan  Cor: Mechanical heart sounds with LVAD hum present. Lungs: clear Abdomen: soft, nontender, nondistended. No hepatosplenomegaly. No bruits or masses. Good bowel sounds. Driveline: C/D/I; securement device intact and driveline incorporated Extremities: no cyanosis, clubbing, rash, edema Neuro: Sedated on vent.  GU: Foley   Telemetry: NSR   Labs: Basic Metabolic Panel:  Recent Labs Lab 10/13/14 0343  10/13/14 1218  10/13/14 1640 10/13/14 1830 10/13/14 1835 10/14/14 0245 10/14/14 1700 10/14/14 1715 10/15/14 0425  NA 129*  < > 131*  < > 134*  --  134* 132*  --  132* 133*  K 4.4  < > 3.5  < > 3.7  --  3.3* 4.5  --  3.9 4.0  CL 98   < > 93*  --  107  --   --  104  --  104 105  CO2 22  --   --   --  22  --   --  24  --  21 24  GLUCOSE 110*  < > 145*  < > 140*  --  134* 127*  --  95 98  BUN 17  < > 14  --  11  --   --  10  --  7 5*  CREATININE 1.30  < > 0.90  --  0.88 1.01  --  0.92 0.86 0.91 0.82  CALCIUM 8.7  --   --   --  6.8*  --   --  7.0*  --  7.6* 8.2*  MG  --   --   --   --   --  1.6  --  2.4 2.0  --  1.9  PHOS  --   --   --   --   --   --   --  2.5  --   --  2.8  < > = values in this interval not displayed.  Liver Function Tests:  Recent Labs Lab 10/10/14 1510 10/12/14 0946 10/14/14 0245 10/14/14 1715 10/15/14 0425  AST 19  31 105* 133* 124*  ALT 16 27 36 47 53  ALKPHOS 76 78 36* 46 62  BILITOT 1.1 0.7 4.1* 4.8* 4.3*  PROT 6.7 6.8 4.9* 4.7* 4.8*  ALBUMIN 3.7 3.4* 3.3* 3.0* 2.8*   No results for input(s): LIPASE, AMYLASE in the last 168 hours. No results for input(s): AMMONIA in the last 168 hours.  CBC:  Recent Labs Lab 10/10/14 1510  10/13/14 1640 10/13/14 1830 10/13/14 1835 10/14/14 0245 10/14/14 1700 10/15/14 0425  WBC 6.8  < > 10.0 9.7  --  8.6 10.6* 13.0*  NEUTROABS 4.4  --   --   --   --  7.0  --  10.6*  HGB 9.2*  < > 8.4* 10.9* 10.2* 8.5* 9.7* 9.8*  HCT 26.9*  < > 24.2* 30.9* 30.0* 23.7* 26.8* 27.9*  MCV 87.3  < > 86.7 85.6  --  82.3 80.5 80.9  PLT 274  < > 61*  61* 59*  --  96* 60* 63*  < > = values in this interval not displayed.  INR:  Recent Labs Lab 10/12/14 0610 10/13/14 1455 10/13/14 1640 10/14/14 0245 10/15/14 0425  INR 1.20 1.55* NOT CALCULATED  2.22* 1.58* 1.41    Other results:  EKG:   Imaging: Dg Chest Port 1 View  10/14/2014   CLINICAL DATA:  Post LEFT ventricular assist device placement  EXAM: PORTABLE CHEST - 1 VIEW  COMPARISON:  Portable exam 0649 hours compared to 10/13/2014  FINDINGS: Tip of endotracheal tube projects 4.1 cm above carina.  Nasogastric tube extends into stomach.  RIGHT jugular Swan-Ganz catheter tip projects over pulmonary artery  bifurcation.  Additional RIGHT jugular line with tip projecting over SVC.  Mediastinal drain and BILATERAL thoracostomy tubes present.  Superior margin of a LEFT ventricular assist device is is identified.  Enlargement of cardiac silhouette.  Stable mediastinal contours with a slight pulmonary vascular congestion.  Question minimal perihilar edema.  No pneumothorax.  IMPRESSION: Line and tube placements as above.  Suspect minimal pulmonary edema.   Electronically Signed   By: Lavonia Dana M.D.   On: 10/14/2014 07:52   Dg Chest Port 1 View  10/13/2014   CLINICAL DATA:  Check endotracheal tube position.  EXAM: PORTABLE CHEST - 1 VIEW  COMPARISON:  10/13/2014  FINDINGS: Endotracheal tube is in good position, tip at the clavicular heads. The gastric suction tube reaches the stomach at least.  Shortening of Swan-Ganz catheter from the right, tip at the main pulmonary artery level. Right IJ central line, tip at the upper right atrium. Thoracic drains are in similar position. Grossly unchanged positioning of a left ventricular assist device, partially imaged currently. No interval displacement of dual-chamber ICD/ pacer leads.  Improved lung volumes . No evidence of air leak or pleural effusion. Stable cardiopericardial enlargement.  IMPRESSION: 1. Unremarkable positioning of tubes and lines, as above. 2. Improved retrocardiac aeration.   Electronically Signed   By: Monte Fantasia M.D.   On: 10/13/2014 19:59   Dg Chest Port 1 View  10/13/2014   CLINICAL DATA:  Left ventricular assist device placement  EXAM: PORTABLE CHEST - 1 VIEW  COMPARISON:  October 11, 2014  FINDINGS: There is a left ventricular assist device present. Endotracheal tube tip is 4.6 cm above the carina. Swan-Ganz catheter tip is in the right main pulmonary outflow tract. There is a nasogastric tube with the tip and side-port in the stomach. There are bilateral chest tubes as well as mediastinal drains. There is a  central catheter with tip in superior  vena cava near the cavoatrial junction. Pacemaker lead tips are attached to the right atrium and right ventricle. No pneumothorax.  There is left lower lobe volume loss. Lungs elsewhere clear. Heart is borderline prominent with pulmonary vascularity within normal limits. No adenopathy. No bone lesions.  IMPRESSION: Tube and catheter positions as described without pneumothorax. Left lower lobe volume loss, probably atelectatic. Lungs elsewhere clear. Left ventricular assist device present.   Electronically Signed   By: Lowella Grip III M.D.   On: 10/13/2014 15:05     Medications:     Scheduled Medications: . sodium chloride   Intravenous Once  . sodium chloride   Intravenous Once  . sodium chloride   Intravenous Once  . acetaminophen  1,000 mg Oral 4 times per day   Or  . acetaminophen (TYLENOL) oral liquid 160 mg/5 mL  1,000 mg Per Tube 4 times per day  . antiseptic oral rinse  7 mL Mouth Rinse QID  . aspirin EC  325 mg Oral Daily   Or  . aspirin  324 mg Per Tube Daily   Or  . aspirin  300 mg Rectal Daily  . atorvastatin  20 mg Oral QHS  . bisacodyl  10 mg Oral Daily   Or  . bisacodyl  10 mg Rectal Daily  . chlorhexidine  15 mL Mouth Rinse BID  . digoxin  0.125 mg Oral Daily  . docusate sodium  200 mg Oral Daily  . insulin regular  0-10 Units Intravenous TID WC  . magnesium oxide  400 mg Oral Q breakfast  . pantoprazole  40 mg Oral Daily  . sodium chloride  3 mL Intravenous Q12H    Infusions: . sodium chloride    . sodium chloride 250 mL (10/14/14 0644)  . sodium chloride Stopped (10/13/14 1854)  . dexmedetomidine 0.3 mcg/kg/hr (10/15/14 0700)  . EPINEPHrine 4 mg in dextrose 5% 250 mL infusion (16 mcg/mL) 3 mcg/min (10/15/14 0700)  . insulin (NOVOLIN-R) infusion 1.6 Units/hr (10/15/14 0700)  . lactated ringers 20 mL/hr at 10/15/14 0700  . lactated ringers 20 mL/hr at 10/15/14 0700  . milrinone 0.5 mcg/kg/min (10/15/14 0700)  . nitroGLYCERIN    . norepinephrine  (LEVOPHED) Adult infusion Stopped (10/15/14 0100)  . phenylephrine (NEO-SYNEPHRINE) Adult infusion      PRN Medications: sodium chloride, albuterol, bisacodyl, midazolam, morphine injection, ondansetron (ZOFRAN) IV, oxyCODONE, sodium chloride, traMADol   Assessment:  1. POD #1 S/P LVAD  2. Acute Blood Loss- Back to OR 4/5  3. Chronic Systolic Heart Failure  4.  NICM - Echo (4/2) with EF 20%, no LV thrombus, moderately dilated RV with mildly decreased systolic function, mild MR, moderate TR. chemo induced adriamycin 5.   H/O LV thrombus- 6.   Non Hodgkins Lymphoma 7.   H/O CVA 2008  8.  H/O VT/VF --St Jude ICD 2010  9.   DMII  Plan/Discussion:   POD #2 S/P LVAD.   Doing ok. Afebrile.  Watch hemoglobin closely. Weaning drips as tolerated. Hemodynamics ok.    I reviewed the LVAD parameters from today, and compared the results to the patient's prior recorded data.  No programming changes were made.  The LVAD is functioning within specified parameters.  The patient performs LVAD self-test daily.  LVAD interrogation was negative for any significant power changes, alarms or PI events/speed drops.  LVAD equipment check completed and is in good working order.  Back-up equipment present.   LVAD education  done on emergency procedures and precautions and reviewed exit site care.  Length of Stay: 5  CLEGG,AMY 10/15/2014, 7:16 AM  VAD Team --- VAD ISSUES ONLY--- Pager [phone] (7am - 7am)  Advanced Heart Failure Team  Pager [phone] (M-F; 7a - 4p)  Please contact [facility] for night-coverage after hours (4p -7a ) and weekends on [email or web address]  Patient seen with NP, agree with the above note.  Weaning NO and epinephrine.  Good cardiac output.  CVP up and weight up considerably.  - Continue to wean epinephrine and NO - Lasix 40 mg IV bid x 2 doses today.   Loralie Champagne 10/15/2014 7:34 AM

## 2014-10-15 NOTE — Progress Notes (Signed)
PT Cancellation Note  Patient Details Name: Andre Tran MRN: [id number] DOB: 1961/03/14   Cancelled Treatment:    Reason Eval/Treat Not Completed: Medical issues which prohibited therapy.  (Pt with elevated HR, RN requesting to defer at this time.) Will continue to follow.  10/15/2014  Donnella Sham, [place] [phone]  (pager)  Anilah Huck, Tessie Fass 10/15/2014, 3:55 PM

## 2014-10-15 NOTE — Progress Notes (Signed)
Patient ID: Andre Tran, male   DOB: 04-29-1961, 54 y.o.   MRN: [id number]  SICU evening rounds:  He has been hemodynamically stable today and got extubated. Milrinone decreased to 0.3 and epi weaned to 1. His heart rate post-extubation was in the 80's then jumped up to 130's, regular. ECG looks sinus tach but could be an SVT. I gave him an extra dose of digoxin and started amio and his HR is now 115, regular. MAP, PI dropped with amio bolus and epi increased to 2.5.  CVP 12 CI 2.6  Good diuresis this am. Just received more lasix.  Awake, alert and comfortable.

## 2014-10-15 NOTE — Progress Notes (Signed)
Nitric Oxide wean started at 0000, 5ppm/hr until down to 5ppm/hr, then 1ppm/hr.

## 2014-10-15 NOTE — Progress Notes (Signed)
Patient c/o of pain 8/10 located at his CT sites. Check sites, dressings C/D/I. 2mg  PRN Morphone administered. After administration patient HR increased into 120's and he sounds congested and unproductive cough is noted. Explained importance of coughing and deep breathing. RT notified, MD notified and new orders have been received. PI dropped with coughing but have returned to 5-6. MAP remain 70-80. Will continue to monitor. Richardean Sale, RN

## 2014-10-15 NOTE — Progress Notes (Signed)
MD notified that HR has sustained a regular rate of 120-130. No new orders received, will continue to monitor. Richardean Sale, RN

## 2014-10-15 NOTE — Progress Notes (Signed)
After scheduled  40mg  IV furosemide adminstration, patient diuresed over 1L within 71min. PI dropped into 3's. MD at bedside and ordered 1 unit of albumin. Medication was effective and LVAD numbers have stabilized. Will continue to monitor. Richardean Sale, RN

## 2014-10-15 NOTE — Progress Notes (Signed)
Anesthesiology Follow-up:  Awake and alert, extubated at 12:26 today.  VS: T-38.4 BP 85/72 (78) RR-27 HR 130 O2 sat 100% on 4L  PAP 28/14 CVP 17 CO/CI 4.8/2.6  Epi 2.5 mcg/min Milrinone 0.3 mcg/kg/min  Levophed weaned off   VAD Rate 8800 PI 4.5  Power 5 watts Flow 3.8 lpm  Started on amiodarone for rapid heart rate  (sinus tach vs SVT)  K-4.7 glucose 144 BUN/Cr 5/0.82 H/h 9.9/29 platelets 63,000  POD #2 Doing well overall, extubated 6 hours ago, tachycardia as noted but otherwise hemodynamics and respiratory function stable.  Roberts Gaudy

## 2014-10-15 NOTE — Procedures (Signed)
Extubation Procedure Note  Patient Details:   Name: Andre Tran DOB: 01-23-1961 MRN: [id number]   Airway Documentation:     Evaluation  O2 sats: stable throughout and currently acceptable Complications: No apparent complications Patient did tolerate procedure well. Bilateral Breath Sounds: Clear Suctioning: Airway Yes   Prior to extubation: Pt suctioned orally, via subglottic and ETT.  Positive cuff leak noted.  NIF -30, VC 1.13.    Pulmonary mechanics and ABG were called to Dr. Cyndia Bent prior to extubation.  Dr. Cyndia Bent stated that he did not want the nitric turned completely off but to run it through a nasal cannula post-extubation (RT turned off nitric completely from RN's verbal order she stated was from MD.)  Per telephone order from Dr. Cyndia Bent, RT will not restart nitric unless necessary but will leave it at bedside at this time.  Post-extubation:  Pt able to cough to produce sputum, state his name, no stridor noted.  [name] 10/15/2014, 12:19 PM

## 2014-10-16 ENCOUNTER — Inpatient Hospital Stay (HOSPITAL_COMMUNITY): Payer: Medicare Other

## 2014-10-16 DIAGNOSIS — Z95811 Presence of heart assist device: Secondary | ICD-10-CM

## 2014-10-16 DIAGNOSIS — I5043 Acute on chronic combined systolic (congestive) and diastolic (congestive) heart failure: Secondary | ICD-10-CM

## 2014-10-16 DIAGNOSIS — I5023 Acute on chronic systolic (congestive) heart failure: Secondary | ICD-10-CM

## 2014-10-16 DIAGNOSIS — I471 Supraventricular tachycardia: Secondary | ICD-10-CM

## 2014-10-16 LAB — TYPE AND SCREEN
ABO/RH(D): O POS
Antibody Screen: NEGATIVE
Unit division: 0
Unit division: 0
Unit division: 0
Unit division: 0
Unit division: 0
Unit division: 0
Unit division: 0
Unit division: 0
Unit division: 0
Unit division: 0
Unit division: 0
Unit division: 0
Unit division: 0
Unit division: 0

## 2014-10-16 LAB — POCT I-STAT 3, ART BLOOD GAS (G3+)
Acid-Base Excess: 1 mmol/L (ref 0.0–2.0)
Bicarbonate: 25.9 mEq/L — ABNORMAL HIGH (ref 20.0–24.0)
O2 Saturation: 99 %
PCO2 ART: 42.2 mmHg (ref 35.0–45.0)
PO2 ART: 123 mmHg — AB (ref 80.0–100.0)
Patient temperature: 99
TCO2: 27 mmol/L (ref 0–100)
pH, Arterial: 7.397 (ref 7.350–7.450)

## 2014-10-16 LAB — CULTURE, RESPIRATORY

## 2014-10-16 LAB — GLUCOSE, CAPILLARY
GLUCOSE-CAPILLARY: 129 mg/dL — AB (ref 70–99)
GLUCOSE-CAPILLARY: 138 mg/dL — AB (ref 70–99)
GLUCOSE-CAPILLARY: 152 mg/dL — AB (ref 70–99)
GLUCOSE-CAPILLARY: 95 mg/dL (ref 70–99)
Glucose-Capillary: 133 mg/dL — ABNORMAL HIGH (ref 70–99)
Glucose-Capillary: 139 mg/dL — ABNORMAL HIGH (ref 70–99)
Glucose-Capillary: 133 mg/dL — ABNORMAL HIGH (ref 70–99)
Glucose-Capillary: 129 mg/dL — ABNORMAL HIGH (ref 70–99)
Glucose-Capillary: 187 mg/dL — ABNORMAL HIGH (ref 70–99)
Glucose-Capillary: 160 mg/dL — ABNORMAL HIGH (ref 70–99)
Glucose-Capillary: 134 mg/dL — ABNORMAL HIGH (ref 70–99)

## 2014-10-16 LAB — COMPREHENSIVE METABOLIC PANEL
ALBUMIN: 3 g/dL — AB (ref 3.5–5.2)
ALT: 44 U/L (ref 0–53)
AST: 76 U/L — ABNORMAL HIGH (ref 0–37)
Alkaline Phosphatase: 83 U/L (ref 39–117)
Anion gap: 6 (ref 5–15)
BUN: 10 mg/dL (ref 6–23)
CALCIUM: 8.4 mg/dL (ref 8.4–10.5)
CHLORIDE: 101 mmol/L (ref 96–112)
CO2: 25 mmol/L (ref 19–32)
CREATININE: 0.93 mg/dL (ref 0.50–1.35)
GFR calc Af Amer: >90 mL/min (ref >90)
GFR calc non Af Amer: >90 mL/min (ref >90)
Glucose, Bld: 139 mg/dL — ABNORMAL HIGH (ref 70–99)
Potassium: 4.6 mmol/L (ref 3.5–5.1)
Sodium: 132 mmol/L — ABNORMAL LOW (ref 135–145)
Total Bilirubin: 2.4 mg/dL — ABNORMAL HIGH (ref 0.3–1.2)
Total Protein: 5.4 g/dL — ABNORMAL LOW (ref 6.0–8.3)

## 2014-10-16 LAB — MAGNESIUM: Magnesium: 1.9 mg/dL (ref 1.5–2.5)

## 2014-10-16 LAB — CBC
HCT: 27 % — ABNORMAL LOW (ref 39.0–52.0)
Hemoglobin: 9.5 g/dL — ABNORMAL LOW (ref 13.0–17.0)
MCH: 29.4 pg (ref 26.0–34.0)
MCHC: 35.2 g/dL (ref 30.0–36.0)
MCV: 83.6 fL (ref 78.0–100.0)
PLATELETS: 69 10*3/uL — AB (ref 150–400)
RBC: 3.23 MIL/uL — ABNORMAL LOW (ref 4.22–5.81)
RDW: 16.3 % — AB (ref 11.5–15.5)
WBC: 13.3 10*3/uL — AB (ref 4.0–10.5)

## 2014-10-16 LAB — LACTATE DEHYDROGENASE: LDH: 413 U/L — AB (ref 94–250)

## 2014-10-16 LAB — CARBOXYHEMOGLOBIN
CARBOXYHEMOGLOBIN: 0.9 % (ref 0.5–1.5)
Carboxyhemoglobin: 0.8 % (ref 0.5–1.5)
METHEMOGLOBIN: 1.1 % (ref 0.0–1.5)
Methemoglobin: 1 % (ref 0.0–1.5)
O2 SAT: 55.3 %
O2 Saturation: 56.5 %
TOTAL HEMOGLOBIN: 10 g/dL — AB (ref 13.5–18.0)
Total hemoglobin: 9.3 g/dL — ABNORMAL LOW (ref 13.5–18.0)

## 2014-10-16 LAB — CULTURE, RESPIRATORY W GRAM STAIN

## 2014-10-16 LAB — POCT I-STAT 4, (NA,K, GLUC, HGB,HCT)
Glucose, Bld: 141 mg/dL — ABNORMAL HIGH (ref 70–99)
HCT: 29 % — ABNORMAL LOW (ref 39.0–52.0)
Hemoglobin: 9.9 g/dL — ABNORMAL LOW (ref 13.0–17.0)
Potassium: 3.9 mmol/L (ref 3.5–5.1)
SODIUM: 132 mmol/L — AB (ref 135–145)

## 2014-10-16 LAB — PROTIME-INR
INR: 1.36 (ref 0.00–1.49)
Prothrombin Time: 16.9 seconds — ABNORMAL HIGH (ref 11.6–15.2)

## 2014-10-16 LAB — PHOSPHORUS: Phosphorus: 3.1 mg/dL (ref 2.3–4.6)

## 2014-10-16 MED ORDER — WARFARIN SODIUM 2.5 MG PO TABS
2.5000 mg | ORAL_TABLET | Freq: Once | ORAL | Status: AC
  Administered 2014-10-16: 2.5 mg via ORAL
  Filled 2014-10-16: qty 1

## 2014-10-16 MED ORDER — FUROSEMIDE 10 MG/ML IJ SOLN
40.0000 mg | Freq: Once | INTRAMUSCULAR | Status: AC
  Administered 2014-10-16: 40 mg via INTRAVENOUS
  Filled 2014-10-16: qty 4

## 2014-10-16 MED ORDER — WARFARIN - PHYSICIAN DOSING INPATIENT
Freq: Every day | Status: DC
  Administered 2014-10-16: 19:00:00

## 2014-10-16 NOTE — Progress Notes (Signed)
Admitted 10/10/14 due to pre-VAD optimization.   HeartMate II LVAD implated on 10/13/14 by Dr. Cyndia Bent for DT VAD.   Vital signs: Temp: 98.4 - 101.1 F HR:  63 - 105 A-line:  70 - 82 O2 Sat: 100 Wt in lbs:   163 > 192 > 189 > 184   LVAD interrogation reveals:  Speed:  9000 Flow: 3.9 Power:  4.7 PI:  7.7 Alarms:  none  Events: 10 PI events Fixed speed:  9000 Low speed limit:  8400  Drive Line:  Dressing intact with attachment device in place. Daily dressing changes.  Labs:  LDH trend: 376 > 417 > 413  INR trend:  2.2 > 1.58 > 1.41 > 1.36  WBC:  10 > 9.7 > 8.6 > 10.6 > 13.0 > 13.3  Hgb: 9.4 > 8.2 > 7.4 > 8.5 > 9.7 > 9.8 > 9.5  Co-Ox:  75 > 78 > 56  Blood products: 1st OR 10/13/14:  3 PCs, 5 FFP, 1 plt 2nd OR 10/13/14: 5 PCs, 4 FFP, 1 plt ICU:  4/4 - 10/14/14:  2 PCs, 1 plt, 2 cryo  Gtts: Epi 1 Levo: off 10/15/14  Mil .3 Amio: 30  Nitric Oxide:  Off 10/16/14   Plan/Recommendations as discussed with team: 1.  Extubated, awake, alert.  Dangled on side of bed (still has swan) 2.  SVT last night with drop in MAP and PI; amiodarone started. 3.  IV lasix for elevated MAP and PI; speed increased to 9000 per Dr. Cyndia Bent. 4.  Continue daily dressing changes.

## 2014-10-16 NOTE — Progress Notes (Signed)
PT Cancellation Note  Patient Details Name: JOSIP MEROLLA MRN: [id number] DOB: August 22, 1960   Cancelled Treatment:    Reason Eval/Treat Not Completed: Patient not medically ready.  Deferred until Swan removed.  Hopefully to see pt 4/8.    Cranford Blessinger, Tessie Fass 10/16/2014, 4:04 PM

## 2014-10-16 NOTE — Progress Notes (Signed)
Patient ID: Andre Tran, male   DOB: 20-Dec-1960, 54 y.o.   MRN: [id number]  SICU Evening Rounds:  Hemodynamics stable. Speed increased to 9000 this am. Pump parameters look good. Remains on Milrinone 0.3 and epi 1.  CVP 8 after diuresis today. MAP 90  Wean epi as tolerated. Will remove swan and pleural tubes OOB.

## 2014-10-16 NOTE — Progress Notes (Signed)
  LVAD Exit Site:  VAD dressing removed and site care performed using sterile technique. Drive line exit site cleaned with Chlora prep applicators x 2, allowed to dry, and gauze dressing with aquacel strip re-applied. Exit site remains erythematous, two sutures intact, small amount serous drainage with no foul odor; no tenderness noted. The velour is fully implanted at exit site. Drive line anchor attachment in place and applied correctly. Driveline dressing is being changed daily per sterile technique.

## 2014-10-16 NOTE — Progress Notes (Signed)
OT Cancellation Note  Patient Details Name: Andre Tran MRN: [id number] DOB: Aug 15, 1960   Cancelled Treatment:    Reason Eval/Treat Not Completed: Medical issues which prohibited therapy (swan-ganz remains in place Will continue to follow.)  [name] 10/16/2014, 2:02 PM

## 2014-10-16 NOTE — Progress Notes (Signed)
HeartMate 2 Rounding Note  Subjective:    POD #3 S/P LVAD. Taken back to OR due to chest wall bleeding, now stabilized. Has received total of 8 UPRBCs.   Extubated yesterday and diuresed with IV lasix. Weight down 5 pounds. Last night amio started for SVT.   On milrinone 0.3 mcg, Epi 1 mcg, and levo off. Nitric off.     Denies SOB.   Hemoglobin down from 9.8>9.5   CVP 12 => 15, PA 28/11, CI 2.0 => 2.2  CO-OX 55%  LDH 376> 417>413  LVAD INTERROGATION:  HeartMate II LVAD:  Flow 3.6  liters/min, speed 8800, power 4, PI 6.8, no PI events   Objective:    Vital Signs:   Temp:  [98.4 F (36.9 C)-101.1 F (38.4 C)] 99.5 F (37.5 C) (04/07 0700) Pulse Rate:  [63-105] 100 (04/07 0600) Resp:  [19-40] 29 (04/07 0700) SpO2:  [99 %-100 %] 100 % (04/07 0700) Arterial Line BP: (70-115)/(63-85) 105/74 mmHg (04/07 0700) FiO2 (%):  [40 %] 40 % (04/06 1100) Weight:  [184 lb 11.9 oz (83.8 kg)] 184 lb 11.9 oz (83.8 kg) (04/07 0600) Last BM Date: 10/10/14 Mean arterial Pressure 84   Intake/Output:   Intake/Output Summary (Last 24 hours) at 10/16/14 0815 Last data filed at 10/16/14 0700  Gross per 24 hour  Intake 2235.78 ml  Output   3315 ml  Net -1079.22 ml     Physical Exam: General: NAD.  HEENT: normal Neck: supple. JVP 9-10 Carotids 2+ bilat; no bruits. No lymphadenopathy or thryomegaly appreciated. RIJ  swan  Cor: Mechanical heart sounds with LVAD hum present. Lungs: Inspiratory Wheezes.  Abdomen: soft, nontender, nondistended. No hepatosplenomegaly. No bruits or masses. Good bowel sounds. Driveline: C/D/I; securement device intact and driveline incorporated Extremities: no cyanosis, clubbing, rash, edema SCDs on bilaterally.  Neuro: A and oriented x3 .  GU: Foley   Telemetry: NSR   Labs: Basic Metabolic Panel:  Recent Labs Lab 10/13/14 1640 10/13/14 1830  10/14/14 0245 10/14/14 1700 10/14/14 1707 10/14/14 1715 10/15/14 0425 10/15/14 1304 10/16/14 0450  NA 134*   --   < > 132*  --  135 132* 133* 134* 132*  K 3.7  --   < > 4.5  --  4.0 3.9 4.0 4.7 4.6  CL 107  --   --  104  --  100 104 105  --  101  CO2 22  --   --  24  --   --  21 24  --  25  GLUCOSE 140*  --   < > 127*  --  104* 95 98 144* 139*  BUN 11  --   --  10  --  6 7 5*  --  10  CREATININE 0.88 1.01  --  0.92 0.86 0.80 0.91 0.82  --  0.93  CALCIUM 6.8*  --   --  7.0*  --   --  7.6* 8.2*  --  8.4  MG  --  1.6  --  2.4 2.0  --   --  1.9  --  1.9  PHOS  --   --   --  2.5  --   --   --  2.8  --  3.1  < > = values in this interval not displayed.  Liver Function Tests:  Recent Labs Lab 10/12/14 0946 10/14/14 0245 10/14/14 1715 10/15/14 0425 10/16/14 0450  AST 31 105* 133* 124* 76*  ALT 27 36 47 53 44  ALKPHOS 78 36* 46 62 83  BILITOT 0.7 4.1* 4.8* 4.3* 2.4*  PROT 6.8 4.9* 4.7* 4.8* 5.4*  ALBUMIN 3.4* 3.3* 3.0* 2.8* 3.0*   No results for input(s): LIPASE, AMYLASE in the last 168 hours. No results for input(s): AMMONIA in the last 168 hours.  CBC:  Recent Labs Lab 10/10/14 1510  10/13/14 1830  10/14/14 0245 10/14/14 1700 10/14/14 1707 10/15/14 0425 10/15/14 1304 10/16/14 0450  WBC 6.8  < > 9.7  --  8.6 10.6*  --  13.0*  --  13.3*  NEUTROABS 4.4  --   --   --  7.0  --   --  10.6*  --   --   HGB 9.2*  < > 10.9*  < > 8.5* 9.7* 9.2* 9.8* 9.9* 9.5*  HCT 26.9*  < > 30.9*  < > 23.7* 26.8* 27.0* 27.9* 29.0* 27.0*  MCV 87.3  < > 85.6  --  82.3 80.5  --  80.9  --  83.6  PLT 274  < > 59*  --  96* 60*  --  63*  --  69*  < > = values in this interval not displayed.  INR:  Recent Labs Lab 10/13/14 1455 10/13/14 1640 10/14/14 0245 10/15/14 0425 10/16/14 0450  INR 1.55* NOT CALCULATED  2.22* 1.58* 1.41 1.36    Other results:  EKG:   Imaging: Dg Chest Port 1 View  10/16/2014   CLINICAL DATA:  Status post placement of implantable left ventricular assist device on October 13, 2014, this acute and chronic CHF  EXAM: PORTABLE CHEST - 1 VIEW  COMPARISON:  Portable chest x-ray of  October 15, 2014  FINDINGS: There has been interval extubation of the trachea and esophagus. The lung volumes remain low. The pulmonary interstitial markings have improved considerably, bilaterally. The retrocardiac region on the left remains dense. The bilateral chest tubes are unchanged in position. The cardiac silhouette is enlarged. The left ventricular assist device is unchanged in position. The permanent pacemaker defibrillator is in reasonable position radiographically. The 2 right internal jugular venous catheters are unchanged in position.  IMPRESSION: There has been interval improvement in the appearance of the pulmonary interstitium consistent with resolving interstitial edema.   Electronically Signed   By: David  Martinique   On: 10/16/2014 07:51   Dg Chest Port 1 View  10/15/2014   CLINICAL DATA:  Left ventricular assist device, followup  EXAM: PORTABLE CHEST - 1 VIEW  COMPARISON:  Portable chest x-ray of 10/14/2014  FINDINGS: The tip of the endotracheal tube is approximately 4.2 cm above the carina. Bilateral chest tubes remain and no pneumothorax is seen. Swan-Ganz catheter tip is in the left main pulmonary artery, and cardiomegaly is stable. Very mild pulmonary vascular congestion remains, perhaps slightly improved. Left ventricular assist device is only partially imaged.  IMPRESSION: 1. Perhaps slight improvement in mild pulmonary vascular congestion. 2. Endotracheal tube tip 4.2 cm above the carina. 3. Bilateral chest tubes. No pneumothorax. The left ventricular assist device is only partially visualized.   Electronically Signed   By: Ivar Drape M.D.   On: 10/15/2014 07:54     Medications:     Scheduled Medications: . acetaminophen  1,000 mg Oral 4 times per day   Or  . acetaminophen (TYLENOL) oral liquid 160 mg/5 mL  1,000 mg Per Tube 4 times per day  . antiseptic oral rinse  7 mL Mouth Rinse QID  . atorvastatin  20 mg Oral QHS  . bisacodyl  10 mg Oral Daily   Or  . bisacodyl  10 mg  Rectal Daily  . chlorhexidine  15 mL Mouth Rinse BID  . digoxin  0.125 mg Oral Daily  . docusate sodium  200 mg Oral Daily  . enoxaparin (LOVENOX) injection  40 mg Subcutaneous QHS  . furosemide  40 mg Intravenous Once  . guaiFENesin  1,200 mg Oral BID  . insulin aspart  0-24 Units Subcutaneous 6 times per day  . insulin detemir  15 Units Subcutaneous BID  . magnesium oxide  400 mg Oral Q breakfast  . pantoprazole  40 mg Oral Daily  . sodium chloride  3 mL Intravenous Q12H  . warfarin  2.5 mg Oral ONCE-1800  . Warfarin - Physician Dosing Inpatient   Does not apply q1800    Infusions: . sodium chloride 10 mL/hr at 10/16/14 0400  . sodium chloride 250 mL (10/14/14 0644)  . sodium chloride Stopped (10/13/14 1854)  . amiodarone 30 mg/hr (10/16/14 0400)  . EPINEPHrine 4 mg in dextrose 5% 250 mL infusion (16 mcg/mL) 1 mcg/min (10/16/14 0515)  . lactated ringers Stopped (10/15/14 2200)  . lactated ringers 20 mL/hr at 10/16/14 0400  . milrinone 0.3 mcg/kg/min (10/16/14 0400)  . nitroGLYCERIN      PRN Medications: sodium chloride, albuterol, bisacodyl, levalbuterol, morphine injection, ondansetron (ZOFRAN) IV, oxyCODONE, sodium chloride, traMADol   Assessment:  1. POD #3 S/P LVAD  2. Acute Blood Loss- Back to OR 4/5  3. Chronic Systolic Heart Failure  4.  NICM - Echo (4/2) with EF 20%, no LV thrombus, moderately dilated RV with mildly decreased systolic function, mild MR, moderate TR. chemo induced adriamycin 5.   H/O LV thrombus- 6.   Non Hodgkins Lymphoma 7.   H/O CVA 2008  8.   H/O VT/VF --St Jude ICD 2010  9.   DMII 10. SVT: Amiodarone started.   Plan/Discussion:   POD #3 S/P LVAD. Speed increased to 9000 per Dr Cyndia Bent.   Doing ok. Afebrile. CO-OX down to 55% on 0.375 mcg of milrinone will repeat. On Epi at 1. Volume status improving. Continue IV lasix. Hemodynamics ok.    Starting coumadin today per Dr Cyndia Bent.   I reviewed the LVAD parameters from today, and compared  the results to the patient's prior recorded data.  No programming changes were made.  The LVAD is functioning within specified parameters.  The patient performs LVAD self-test daily.  LVAD interrogation was negative for any significant power changes, alarms or PI events/speed drops.  LVAD equipment check completed and is in good working order.  Back-up equipment present.   LVAD education done on emergency procedures and precautions and reviewed exit site care.  Length of Stay: 6  CLEGG,AMY 10/16/2014, 8:15 AM  VAD Team --- VAD ISSUES ONLY--- Pager [phone] (7am - 7am)  Advanced Heart Failure Team  Pager [phone] (M-F; 7a - 4p)  Please contact [facility] for night-coverage after hours (4p -7a ) and weekends on [email or web address]  Patient seen with NP, agree with the above note.  Amiodarone gtt started with run SVT last night, now NSR. Weaning epinephrine, output looks ok.  LVAD parameters good.  Starting coumadin tonight.  CVP 15, will give dose Lasix 40 mg IV x 1 now.   CLEGG,AMY 10/16/2014 8:15 AM

## 2014-10-16 NOTE — Progress Notes (Signed)
CSW met with patient at bedside. Patient reports he is doing "excellent". Patient states that he had some chicken soup and enjoyed it. He is hoping to get out of bed this afternoon to the chair and begin his recovery. Patient is motivated and very encouraged. CSW will continue to follow for support throughout recovery. Raquel Sarna, [place]

## 2014-10-16 NOTE — Progress Notes (Signed)
Patient ID: Andre Tran, male   DOB: [birth year], 54 y.o.   MRN: [id number] HeartMate 2 Rounding Note  Subjective:    POD 3 s/p LVAD and takeback for bleeding from the chest wall. His RV function look good on pre-bypass TEE and post-bypass TEE.  Milrinone on 0.3. Epi weaned off last night but Co-ox 55 this am with CVP up to 18 so epi restarted at 1. CVP now back down to 12.  CI 2 PA 28/11  Amio started last pm for what looked like SVT 130. Broke overnight and now sinus 90.   LVAD INTERROGATION:  HeartMate II LVAD:  Flow 3.6 liters/min, speed 8800, power 4, PI 6.8-7.2.  No PI events yesterday  Objective:    Vital Signs:   Temp:  [98.4 F (36.9 C)-101.1 F (38.4 C)] 99.5 F (37.5 C) (04/07 0700) Pulse Rate:  [63-105] 100 (04/07 0600) Resp:  [19-40] 29 (04/07 0700) SpO2:  [99 %-100 %] 100 % (04/07 0700) Arterial Line BP: (70-115)/(63-85) 105/74 mmHg (04/07 0700) FiO2 (%):  [40 %] 40 % (04/06 1100) Weight:  [83.8 kg (184 lb 11.9 oz)] 83.8 kg (184 lb 11.9 oz) (04/07 0600) Last BM Date: 10/10/14 Mean arterial Pressure 82  Intake/Output:   Intake/Output Summary (Last 24 hours) at 10/16/14 0749 Last data filed at 10/16/14 0700  Gross per 24 hour  Intake 2315.38 ml  Output   3410 ml  Net -1094.62 ml     Physical Exam: General:  Well appearing. No resp difficulty HEENT: normal Neck:  Carotids 2+ bilat; no bruits. No lymphadenopathy or thryomegaly appreciated. Cor: Distant heart sounds with LVAD hum present. Lungs: clear Abdomen: soft, nontender, nondistended. No hepatosplenomegaly. No bruits or masses. Good bowel sounds. Extremities: mild edema Neuro: alert & orientedx3, cranial nerves grossly intact. moves all 4 extremities w/o difficulty. Affect pleasant  Telemetry: sinus 90  Labs: Basic Metabolic Panel:  Recent Labs Lab 10/13/14 1640 10/13/14 1830  10/14/14 0245 10/14/14 1700 10/14/14 1707 10/14/14 1715 10/15/14 0425 10/15/14 1304 10/16/14 0450  NA 134*  --    < > 132*  --  135 132* 133* 134* 132*  K 3.7  --   < > 4.5  --  4.0 3.9 4.0 4.7 4.6  CL 107  --   --  104  --  100 104 105  --  101  CO2 22  --   --  24  --   --  21 24  --  25  GLUCOSE 140*  --   < > 127*  --  104* 95 98 144* 139*  BUN 11  --   --  10  --  6 7 5*  --  10  CREATININE 0.88 1.01  --  0.92 0.86 0.80 0.91 0.82  --  0.93  CALCIUM 6.8*  --   --  7.0*  --   --  7.6* 8.2*  --  8.4  MG  --  1.6  --  2.4 2.0  --   --  1.9  --  1.9  PHOS  --   --   --  2.5  --   --   --  2.8  --  3.1  < > = values in this interval not displayed.  Liver Function Tests:  Recent Labs Lab 10/12/14 0946 10/14/14 0245 10/14/14 1715 10/15/14 0425 10/16/14 0450  AST 31 105* 133* 124* 76*  ALT 27 36 47 53 44  ALKPHOS 78 36* 46 62 83  BILITOT 0.7 4.1* 4.8* 4.3* 2.4*  PROT 6.8 4.9* 4.7* 4.8* 5.4*  ALBUMIN 3.4* 3.3* 3.0* 2.8* 3.0*   No results for input(s): LIPASE, AMYLASE in the last 168 hours. No results for input(s): AMMONIA in the last 168 hours.  CBC:  Recent Labs Lab 10/10/14 1510  10/13/14 1830  10/14/14 0245 10/14/14 1700 10/14/14 1707 10/15/14 0425 10/15/14 1304 10/16/14 0450  WBC 6.8  < > 9.7  --  8.6 10.6*  --  13.0*  --  13.3*  NEUTROABS 4.4  --   --   --  7.0  --   --  10.6*  --   --   HGB 9.2*  < > 10.9*  < > 8.5* 9.7* 9.2* 9.8* 9.9* 9.5*  HCT 26.9*  < > 30.9*  < > 23.7* 26.8* 27.0* 27.9* 29.0* 27.0*  MCV 87.3  < > 85.6  --  82.3 80.5  --  80.9  --  83.6  PLT 274  < > 59*  --  96* 60*  --  63*  --  69*  < > = values in this interval not displayed.  INR:  Recent Labs Lab 10/13/14 1455 10/13/14 1640 10/14/14 0245 10/15/14 0425 10/16/14 0450  INR 1.55* NOT CALCULATED  2.22* 1.58* 1.41 1.36   LDH 009>381>829   Other results:  EKG:   Imaging: Dg Chest Port 1 View  10/15/2014   CLINICAL DATA:  Left ventricular assist device, followup  EXAM: PORTABLE CHEST - 1 VIEW  COMPARISON:  Portable chest x-ray of 10/14/2014  FINDINGS: The tip of the endotracheal tube is  approximately 4.2 cm above the carina. Bilateral chest tubes remain and no pneumothorax is seen. Swan-Ganz catheter tip is in the left main pulmonary artery, and cardiomegaly is stable. Very mild pulmonary vascular congestion remains, perhaps slightly improved. Left ventricular assist device is only partially imaged.  IMPRESSION: 1. Perhaps slight improvement in mild pulmonary vascular congestion. 2. Endotracheal tube tip 4.2 cm above the carina. 3. Bilateral chest tubes. No pneumothorax. The left ventricular assist device is only partially visualized.   Electronically Signed   By: Ivar Drape M.D.   On: 10/15/2014 07:54      Medications:     Scheduled Medications: . acetaminophen  1,000 mg Oral 4 times per day   Or  . acetaminophen (TYLENOL) oral liquid 160 mg/5 mL  1,000 mg Per Tube 4 times per day  . antiseptic oral rinse  7 mL Mouth Rinse QID  . atorvastatin  20 mg Oral QHS  . bisacodyl  10 mg Oral Daily   Or  . bisacodyl  10 mg Rectal Daily  . chlorhexidine  15 mL Mouth Rinse BID  . digoxin  0.125 mg Oral Daily  . docusate sodium  200 mg Oral Daily  . enoxaparin (LOVENOX) injection  40 mg Subcutaneous QHS  . guaiFENesin  1,200 mg Oral BID  . insulin aspart  0-24 Units Subcutaneous 6 times per day  . insulin detemir  15 Units Subcutaneous BID  . magnesium oxide  400 mg Oral Q breakfast  . pantoprazole  40 mg Oral Daily  . sodium chloride  3 mL Intravenous Q12H     Infusions: . sodium chloride 10 mL/hr at 10/16/14 0400  . sodium chloride 250 mL (10/14/14 0644)  . sodium chloride Stopped (10/13/14 1854)  . amiodarone 30 mg/hr (10/16/14 0400)  . EPINEPHrine 4 mg in dextrose 5% 250 mL infusion (16 mcg/mL) 1 mcg/min (10/16/14 0515)  .  lactated ringers Stopped (10/15/14 2200)  . lactated ringers 20 mL/hr at 10/16/14 0400  . milrinone 0.3 mcg/kg/min (10/16/14 0400)  . nitroGLYCERIN       PRN Medications:  sodium chloride, albuterol, bisacodyl, levalbuterol, morphine  injection, ondansetron (ZOFRAN) IV, oxyCODONE, sodium chloride, traMADol   Assessment:   1. POD #1 S/P LVAD and reexploration for bleeding 2. Acute Blood Lossanemia. Hgb stable 3. Chronic Systolic Heart Failure with preop echo showing EF 20%, no LV thrombus, moderately dilated RV with mildly decreased systolic function, mild MR, moderate TR. chemo induced adriamycin. 4. H/O LV thrombus- on coumadin preop 5. Non Hodgkins Lymphoma 6. H/O CVA 2008  7. H/O VT/VF --St Jude ICD 2010  8. DMII 9. Postop thrombocytopenia  Plan/Discussion:    He is very stable overall. Co-ox is down to 55 this am off epi so it was restarted at 1. His PI is around 7 so will turn up speed to 9000. Hgb 9.5. Will plan to remove PA catheter today so he can get out of bed to chair.  Dangle today and may be able to remove pleural tubes later.  Would continue gentle diuresis. Wt is 19 lbs over preop.   Start coumadin slowly today.  I reviewed the LVAD parameters from today, and compared the results to the patient's prior recorded data.  No programming changes were made.  The LVAD is functioning within specified parameters.  The patient performs LVAD self-test daily.  LVAD interrogation was negative for any significant power changes, alarms or PI events/speed drops.  LVAD equipment check completed and is in good working order.  Back-up equipment present.   LVAD education done on emergency procedures and precautions and reviewed exit site care.  Length of Stay: 6  [name] 10/16/2014, 7:49 AM

## 2014-10-17 ENCOUNTER — Inpatient Hospital Stay (HOSPITAL_COMMUNITY): Payer: Medicare Other

## 2014-10-17 LAB — CBC
HEMATOCRIT: 27.2 % — AB (ref 39.0–52.0)
Hemoglobin: 9.4 g/dL — ABNORMAL LOW (ref 13.0–17.0)
MCH: 28.7 pg (ref 26.0–34.0)
MCHC: 34.6 g/dL (ref 30.0–36.0)
MCV: 82.9 fL (ref 78.0–100.0)
PLATELETS: 103 10*3/uL — AB (ref 150–400)
RBC: 3.28 MIL/uL — ABNORMAL LOW (ref 4.22–5.81)
RDW: 16 % — AB (ref 11.5–15.5)
WBC: 14 10*3/uL — ABNORMAL HIGH (ref 4.0–10.5)

## 2014-10-17 LAB — BASIC METABOLIC PANEL
Anion gap: 9 (ref 5–15)
BUN: 14 mg/dL (ref 6–23)
CALCIUM: 8.4 mg/dL (ref 8.4–10.5)
CO2: 26 mmol/L (ref 19–32)
Chloride: 97 mmol/L (ref 96–112)
Creatinine, Ser: 0.87 mg/dL (ref 0.50–1.35)
Glucose, Bld: 71 mg/dL (ref 70–99)
Potassium: 3.6 mmol/L (ref 3.5–5.1)
Sodium: 132 mmol/L — ABNORMAL LOW (ref 135–145)

## 2014-10-17 LAB — GLUCOSE, CAPILLARY
GLUCOSE-CAPILLARY: 76 mg/dL (ref 70–99)
Glucose-Capillary: 75 mg/dL (ref 70–99)
Glucose-Capillary: 114 mg/dL — ABNORMAL HIGH (ref 70–99)
Glucose-Capillary: 118 mg/dL — ABNORMAL HIGH (ref 70–99)
Glucose-Capillary: 110 mg/dL — ABNORMAL HIGH (ref 70–99)

## 2014-10-17 LAB — BLOOD GAS, ARTERIAL
ACID-BASE EXCESS: 4.7 mmol/L — AB (ref 0.0–2.0)
BICARBONATE: 28.7 meq/L — AB (ref 20.0–24.0)
Drawn by: 39898
FIO2: 0.36 %
O2 SAT: 99 %
PO2 ART: 148 mmHg — AB (ref 80.0–100.0)
Patient temperature: 98.6
TCO2: 30 mmol/L (ref 0–100)
pCO2 arterial: 42.5 mmHg (ref 35.0–45.0)
pH, Arterial: 7.444 (ref 7.350–7.450)

## 2014-10-17 LAB — CARBOXYHEMOGLOBIN
CARBOXYHEMOGLOBIN: 1 % (ref 0.5–1.5)
Methemoglobin: 0.9 % (ref 0.0–1.5)
O2 SAT: 55 %
Total hemoglobin: 9.2 g/dL — ABNORMAL LOW (ref 13.5–18.0)

## 2014-10-17 LAB — LACTATE DEHYDROGENASE: LDH: 409 U/L — ABNORMAL HIGH (ref 94–250)

## 2014-10-17 LAB — PROTIME-INR
INR: 1.33 (ref 0.00–1.49)
Prothrombin Time: 16.7 seconds — ABNORMAL HIGH (ref 11.6–15.2)

## 2014-10-17 LAB — PHOSPHORUS: PHOSPHORUS: 2.5 mg/dL (ref 2.3–4.6)

## 2014-10-17 LAB — MAGNESIUM: MAGNESIUM: 2 mg/dL (ref 1.5–2.5)

## 2014-10-17 MED ORDER — CETYLPYRIDINIUM CHLORIDE 0.05 % MT LIQD
7.0000 mL | Freq: Two times a day (BID) | OROMUCOSAL | Status: DC
  Administered 2014-10-17: 7 mL via OROMUCOSAL

## 2014-10-17 MED ORDER — HYDRALAZINE HCL 20 MG/ML IJ SOLN
10.0000 mg | Freq: Once | INTRAMUSCULAR | Status: AC
Start: 2014-10-17 — End: 2014-10-17
  Administered 2014-10-17: 10 mg via INTRAVENOUS
  Filled 2014-10-17: qty 1

## 2014-10-17 MED ORDER — FUROSEMIDE 10 MG/ML IJ SOLN
40.0000 mg | Freq: Once | INTRAMUSCULAR | Status: AC
  Administered 2014-10-17: 40 mg via INTRAVENOUS

## 2014-10-17 MED ORDER — ASPIRIN EC 81 MG PO TBEC
81.0000 mg | DELAYED_RELEASE_TABLET | Freq: Every day | ORAL | Status: DC
  Administered 2014-10-17 – 2014-10-19 (×3): 81 mg via ORAL
  Filled 2014-10-17 (×4): qty 1

## 2014-10-17 MED ORDER — POTASSIUM CHLORIDE 10 MEQ/50ML IV SOLN: 10.0000 meq | INTRAVENOUS | Status: DC | PRN

## 2014-10-17 MED ORDER — POTASSIUM CHLORIDE 10 MEQ/50ML IV SOLN
10.0000 meq | INTRAVENOUS | Status: AC
  Administered 2014-10-17 (×3): 10 meq via INTRAVENOUS
  Filled 2014-10-17 (×3): qty 50

## 2014-10-17 MED ORDER — POTASSIUM CHLORIDE CRYS ER 20 MEQ PO TBCR
40.0000 meq | EXTENDED_RELEASE_TABLET | Freq: Once | ORAL | Status: AC
  Administered 2014-10-17: 40 meq via ORAL
  Filled 2014-10-17: qty 2

## 2014-10-17 MED ORDER — WARFARIN SODIUM 2.5 MG PO TABS
2.5000 mg | ORAL_TABLET | Freq: Once | ORAL | Status: AC
  Administered 2014-10-17: 2.5 mg via ORAL
  Filled 2014-10-17: qty 1

## 2014-10-17 NOTE — Progress Notes (Signed)
Admitted 10/10/14 due to pre-VAD optimization.   HeartMate II LVAD implated on 10/13/14 by Dr. Cyndia Bent for DT VAD.   Vital signs: Temp: 98.3  F HR:  86 - 93  A-line:  104/70 - 108/72 MAP Doppler: 82 - 379 (systolic? PI's are > 7) O2 Sat: 100 (2 L [place]) RR: 25 - 30  Wt in lbs:   163 > 192 > 189 > 184 > 179    LVAD interrogation reveals:  Speed:  9000 Flow: 4.0 Power:  4.7 PI:  7.3 Alarms:  none  Events: 0 PI today, (1) 10/16/14, (0) 10/14/13 Fixed speed:  9000 Low speed limit:  8400  I reviewed the LVAD parameters from today, and compared the results to the patient's prior recorded data. No programming changes were made. The LVAD is functioning within specified parameters. The staff performs LVAD self-test daily. LVAD interrogation was negative for any significant power changes, alarms or PI events/speed drops. LVAD equipment check completed and is in good working order. Back-up equipment present.  Drive Line:  Dressing intact with attachment device in place. Daily dressing changes.  Labs:  LDH trend: 376 > 417 > 413 > 409  INR trend:  2.2 > 1.58 > 1.41 > 1.36 > 1.33  WBC:  10 > 9.7 > 8.6 > 10.6 > 13.0 > 13.3 > 14  Hgb: 9.4 > 8.2 > 7.4 > 8.5 > 9.7 > 9.8 > 9.5 > 9.4  Co-Ox:  75 > 78 > 56 > 55  Blood products: 1st OR 10/13/14:  3 PCs, 5 FFP, 1 plt 2nd OR 10/13/14: 5 PCs, 4 FFP, 1 plt ICU:  4/4 - 10/14/14:  2 PCs, 1 plt, 2 cryo  Gtts: Amiodarone 30 mg/h Milrinone 0.375 mcg/kg/min  Nitric Oxide:  Off 10/16/14  ASA Started: 10/17/14  Warfarin Started: 10/16/14   Plan/Recommendations as discussed with team: 1.  Ambulating today with PT. Up in chair doing well today.   2.  No further SVT  3.  Doppler MAP correlating with SBP on aline  4.  Continue daily dressing changes.   5. Mobility and pulmonary toilet

## 2014-10-17 NOTE — Evaluation (Signed)
Physical Therapy Evaluation Patient Details Name: Andre Tran MRN: [id number] DOB: 1960/08/06 Today's Date: 10/17/2014   History of Present Illness  Pt s/p LVAD 10/13/14 with hospital course complicated by chest wall bleeding requiring return to OR and acute blood loss anemia. 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  Clinical Impression  Pt admitted with/for LVAD procedure.  Pt currently limited functionally due to the problems listed below.  (see problems list.)  Pt will benefit from PT to maximize function and safety to be able to get home safely with available assist of family.     Follow Up Recommendations Home health PT;Supervision/Assistance - 24 hour    Equipment Recommendations  None recommended by PT    Recommendations for Other Services       Precautions / Restrictions Precautions Precautions: Fall;Sternal Precaution Comments: instructed in sternal precautions with mobility Restrictions Weight Bearing Restrictions: Yes Other Position/Activity Restrictions: sternal      Mobility  Bed Mobility               General bed mobility comments: In chair  Transfers Overall transfer level: Needs assistance Equipment used: Pushed w/c Transfers: Sit to/from Stand Sit to Stand: Min assist;+2 safety/equipment         General transfer comment: verbal cues for sternal precautions and technique  Ambulation/Gait Ambulation/Gait assistance: Min guard Ambulation Distance (Feet): 300 Feet Assistive device:  (pushed w/c) Gait Pattern/deviations: Step-through pattern Gait velocity: moderate  Gait velocity interpretation: at or above normal speed for age/gender General Gait Details: generally steady gait.   prefering slow cadance at this point.  Flexed posture,  Stairs            Wheelchair Mobility    Modified Rankin (Stroke Patients Only)       Balance Overall balance assessment: Needs  assistance Sitting-balance support: No upper extremity supported Sitting balance-Leahy Scale: Fair     Standing balance support: Single extremity supported;Bilateral upper extremity supported Standing balance-Leahy Scale: Poor                               Pertinent Vitals/Pain Pain Assessment: No/denies pain    Home Living Family/patient expects to be discharged to:: Private residence Living Arrangements: Spouse/significant other;Children Available Help at Discharge: Family;Available 24 hours/day Type of Home: House Home Access: Stairs to enter Entrance Stairs-Rails: Right;Left;Can reach both Entrance Stairs-Number of Steps: 5 Home Layout: 1/2 bath on main level;Two level Home Equipment: Walker - 4 wheels;Cane - single point;Bedside commode;Wheelchair - manual;Shower seat      Prior Function Level of Independence: Independent         Comments: Unable to work or drive since CVA      Hand Dominance   Dominant Hand: Left    Extremity/Trunk Assessment   Upper Extremity Assessment: Defer to OT evaluation           Lower Extremity Assessment: Overall WFL for tasks assessed (mild weakness bil LE and lower trunk)         Communication   Communication: No difficulties  Cognition Arousal/Alertness: Awake/alert Behavior During Therapy: WFL for tasks assessed/performed Overall Cognitive Status: Within Functional Limits for tasks assessed                      General Comments General comments (skin integrity, edema, etc.): Emphasis on education with pt then later wife verbalizing ,  directing change over from console to battteries with discussion of important safety aspects.    Exercises        Assessment/Plan    PT Assessment Patient needs continued PT services  PT Diagnosis Generalized weakness;Other (comment) (decreased activity tolerance)   PT Problem List Decreased strength;Decreased activity tolerance;Decreased mobility;Decreased  balance;Decreased knowledge of use of DME;Cardiopulmonary status limiting activity;Pain  PT Treatment Interventions DME instruction;Gait training;Stair training;Functional mobility training;Therapeutic activities;Balance training;Patient/family education   PT Goals (Current goals can be found in the Care Plan section) Acute Rehab PT Goals Patient Stated Goal: get stronger and return home PT Goal Formulation: With patient Time For Goal Achievement: 10/31/14 Potential to Achieve Goals: Good    Frequency Min 3X/week   Barriers to discharge        Co-evaluation PT/OT/SLP Co-Evaluation/Treatment: Yes Reason for Co-Treatment: Complexity of the patient's impairments (multi-system involvement) PT goals addressed during session: Mobility/safety with mobility         End of Session   Activity Tolerance: Patient tolerated treatment well Patient left: with call bell/phone within reach;in chair;with family/visitor present Nurse Communication: Mobility status         Time: 8768-1157 PT Time Calculation (min) (ACUTE ONLY): 48 min   Charges:     PT Treatments $Gait Training: 8-22 mins $Therapeutic Activity: 8-22 mins   PT G Codes:        Lakie Mclouth, Tessie Fass 10/17/2014, 2:45 PM 10/17/2014  Donnella Sham, PT [phone]  (pager)

## 2014-10-17 NOTE — Progress Notes (Signed)
UR Completed.  [phone]  

## 2014-10-17 NOTE — Progress Notes (Signed)
Dr. Cyndia Bent paged about map being 110. Will give 10 mg hydralazine IV once. Will continue to monitor.  Andre Tran

## 2014-10-17 NOTE — Progress Notes (Signed)
HeartMate 2 Rounding Note  Subjective:    POD S/P LVAD 4/4 . Taken back to OR due to chest wall bleeding, now stabilized. Has received total of 8 UPRBCs.   Extubated 4/6. Weight down 5 pounds.  Remains on amio and milrinone 0.3 mcg.  OOB to chair. Afebrile. Yesterday he started on coumadin. OOB to chair.   Denies SOB.   Hemoglobin 9.5>9.5  WBC 13.3>14  CO-OX 55%  LDH 376> 417>413 >409 LVAD INTERROGATION:  HeartMate II LVAD:  Flow 3.06  liters/min, speed 9000, power 4.6, PI 7.0 , no PI events   Objective:    Vital Signs:   Temp:  [98.3 F (36.8 C)-99.9 F (37.7 C)] 98.3 F (36.8 C) (04/08 0700) Pulse Rate:  [86-93] 93 (04/08 0400) Resp:  [11-32] 23 (04/08 0700) SpO2:  [98 %-100 %] 100 % (04/08 0700) Arterial Line BP: (95-123)/(72-89) 108/72 mmHg (04/08 0700) Weight:  [179 lb 10.8 oz (81.5 kg)] 179 lb 10.8 oz (81.5 kg) (04/08 0600) Last BM Date: 10/10/14 Mean arterial Pressure 90  Intake/Output:   Intake/Output Summary (Last 24 hours) at 10/17/14 0843 Last data filed at 10/17/14 0800  Gross per 24 hour  Intake 1781.9 ml  Output   2730 ml  Net -948.1 ml     Physical Exam: General: NAD. Sitting in chair. HEENT: normal Neck: supple. JVP 9-10 Carotids 2+ bilat; no bruits. No lymphadenopathy or thryomegaly appreciated. RIJ  swan  Cor: Mechanical heart sounds with LVAD hum present. Lungs: Rhonchi throughout.   Abdomen: soft, nontender, nondistended. No hepatosplenomegaly. No bruits or masses. Good bowel sounds. Driveline: C/D/I; driveline dressing intact Extremities: no cyanosis, clubbing, rash, edema SCDs on bilaterally.  Neuro: A and oriented x3 .  GU: Foley   Telemetry: NSR   Labs: Basic Metabolic Panel:  Recent Labs Lab 10/14/14 0245 10/14/14 1700 10/14/14 1707 10/14/14 1715 10/15/14 0425 10/15/14 1304 10/16/14 0450 10/16/14 2057 10/17/14 0430  NA 132*  --  135 132* 133* 134* 132* 132* 132*  K 4.5  --  4.0 3.9 4.0 4.7 4.6 3.9 3.6  CL 104  --  100  104 105  --  101  --  97  CO2 24  --   --  21 24  --  25  --  26  GLUCOSE 127*  --  104* 95 98 144* 139* 141* 71  BUN 10  --  6 7 5*  --  10  --  14  CREATININE 0.92 0.86 0.80 0.91 0.82  --  0.93  --  0.87  CALCIUM 7.0*  --   --  7.6* 8.2*  --  8.4  --  8.4  MG 2.4 2.0  --   --  1.9  --  1.9  --  2.0  PHOS 2.5  --   --   --  2.8  --  3.1  --  2.5    Liver Function Tests:  Recent Labs Lab 10/12/14 0946 10/14/14 0245 10/14/14 1715 10/15/14 0425 10/16/14 0450  AST 31 105* 133* 124* 76*  ALT 27 36 47 53 44  ALKPHOS 78 36* 46 62 83  BILITOT 0.7 4.1* 4.8* 4.3* 2.4*  PROT 6.8 4.9* 4.7* 4.8* 5.4*  ALBUMIN 3.4* 3.3* 3.0* 2.8* 3.0*   No results for input(s): LIPASE, AMYLASE in the last 168 hours. No results for input(s): AMMONIA in the last 168 hours.  CBC:  Recent Labs Lab 10/10/14 1510  10/14/14 0245 10/14/14 1700  10/15/14 0425 10/15/14 1304 10/16/14  3382 10/16/14 2057 10/17/14 0430  WBC 6.8  < > 8.6 10.6*  --  13.0*  --  13.3*  --  14.0*  NEUTROABS 4.4  --  7.0  --   --  10.6*  --   --   --   --   HGB 9.2*  < > 8.5* 9.7*  < > 9.8* 9.9* 9.5* 9.9* 9.4*  HCT 26.9*  < > 23.7* 26.8*  < > 27.9* 29.0* 27.0* 29.0* 27.2*  MCV 87.3  < > 82.3 80.5  --  80.9  --  83.6  --  82.9  PLT 274  < > 96* 60*  --  63*  --  69*  --  103*  < > = values in this interval not displayed.  INR:  Recent Labs Lab 10/13/14 1640 10/14/14 0245 10/15/14 0425 10/16/14 0450 10/17/14 0430  INR NOT CALCULATED  2.22* 1.58* 1.41 1.36 1.33    Other results:  EKG:   Imaging: Dg Chest Port 1 View  10/17/2014   CLINICAL DATA:  Left ventricular assist device.  EXAM: PORTABLE CHEST - 1 VIEW  COMPARISON:  10/16/2014.  FINDINGS: Interval removal of bilateral chest tubes. Interval removal of Swan-Ganz catheter. Two right IJ line stable position. Median sternotomy. Pacemaker left ventricular assist device in stable position. Stable cardiomegaly. Low lung volumes with basilar atelectasis and/or  infiltrates/pulmonary edema. Small pleural effusions cannot be excluded. No pneumothorax.  IMPRESSION: 1. Interim removal bilateral chest tubes. Interval removal Swan-Ganz catheter. Two right IJ lines in stable position. 2. Prior median sternotomy. Cardiac pacer left ventricular assist device in stable position. 3. Low lung volumes with bibasilar atelectasis and/or infiltrates/pulmonary edema. Small pleural effusions cannot be excluded. No pneumothorax.   Electronically Signed   By: Marcello Moores  Register   On: 10/17/2014 07:56   Dg Chest Port 1 View  10/16/2014   CLINICAL DATA:  Status post placement of implantable left ventricular assist device on October 13, 2014, this acute and chronic CHF  EXAM: PORTABLE CHEST - 1 VIEW  COMPARISON:  Portable chest x-ray of October 15, 2014  FINDINGS: There has been interval extubation of the trachea and esophagus. The lung volumes remain low. The pulmonary interstitial markings have improved considerably, bilaterally. The retrocardiac region on the left remains dense. The bilateral chest tubes are unchanged in position. The cardiac silhouette is enlarged. The left ventricular assist device is unchanged in position. The permanent pacemaker defibrillator is in reasonable position radiographically. The 2 right internal jugular venous catheters are unchanged in position.  IMPRESSION: There has been interval improvement in the appearance of the pulmonary interstitium consistent with resolving interstitial edema.   Electronically Signed   By: David  Martinique   On: 10/16/2014 07:51     Medications:     Scheduled Medications: . acetaminophen  1,000 mg Oral 4 times per day   Or  . acetaminophen (TYLENOL) oral liquid 160 mg/5 mL  1,000 mg Per Tube 4 times per day  . antiseptic oral rinse  7 mL Mouth Rinse QID  . atorvastatin  20 mg Oral QHS  . bisacodyl  10 mg Oral Daily   Or  . bisacodyl  10 mg Rectal Daily  . digoxin  0.125 mg Oral Daily  . docusate sodium  200 mg Oral Daily  .  enoxaparin (LOVENOX) injection  40 mg Subcutaneous QHS  . guaiFENesin  1,200 mg Oral BID  . insulin aspart  0-24 Units Subcutaneous 6 times per day  . insulin detemir  15 Units Subcutaneous BID  . magnesium oxide  400 mg Oral Q breakfast  . pantoprazole  40 mg Oral Daily  . potassium chloride  10 mEq Intravenous Q1 Hr x 3  . sodium chloride  3 mL Intravenous Q12H  . Warfarin - Physician Dosing Inpatient   Does not apply q1800    Infusions: . sodium chloride Stopped (10/17/14 0500)  . sodium chloride 250 mL (10/14/14 0644)  . sodium chloride Stopped (10/13/14 1854)  . amiodarone 30 mg/hr (10/17/14 0400)  . EPINEPHrine 4 mg in dextrose 5% 250 mL infusion (16 mcg/mL) Stopped (10/16/14 2100)  . lactated ringers Stopped (10/15/14 2200)  . lactated ringers 20 mL/hr at 10/17/14 0400  . milrinone 0.3 mcg/kg/min (10/17/14 0413)  . nitroGLYCERIN      PRN Medications: sodium chloride, albuterol, bisacodyl, levalbuterol, morphine injection, ondansetron (ZOFRAN) IV, oxyCODONE, sodium chloride, traMADol   Assessment:  1. POD #4 S/P LVAD  2. Acute Blood Loss- Back to OR 4/5  3. Chronic Systolic Heart Failure  4.  NICM - Echo (4/2) with EF 20%, no LV thrombus, moderately dilated RV with mildly decreased systolic function, mild MR, moderate TR. chemo induced adriamycin 5.   H/O LV thrombus- 6.   Non Hodgkins Lymphoma 7.   H/O CVA 2008  8.   H/O VT/VF --St Jude ICD 2010  9.   DMII 10. SVT: Amiodarone started.   Plan/Discussion:   POD #4 S/P LVAD.   Doing ok. Afebrile. WBC up a little. Needs aggressive pulmonary toilet.   CO-OX  55% on 0.375 mcg of milrinone. Off Epi at 1. Volume status improving. Continue IV lasix. .    On coumadin and to start aspirin today. INR 1.3 per Dr Cyndia Bent.    I reviewed the LVAD parameters from today, and compared the results to the patient's prior recorded data.  No programming changes were made.  The LVAD is functioning within specified parameters.  The  patient performs LVAD self-test daily.  LVAD interrogation was negative for any significant power changes, alarms or PI events/speed drops.  LVAD equipment check completed and is in good working order.  Back-up equipment present.   LVAD education done on emergency procedures and precautions and reviewed exit site care.  Length of Stay: 7  CLEGG,AMY 10/17/2014, 8:43 AM  VAD Team --- VAD ISSUES ONLY--- Pager [phone] (7am - 7am)  Advanced Heart Failure Team  Pager [phone] (M-F; 7a - 4p)  Please contact [facility] for night-coverage after hours (4p -7a ) and weekends on [email or web address]   Patient seen and examined with Darrick Grinder, NP. We discussed all aspects of the encounter. I agree with the assessment and plan as stated above.   Progressing well. Walking the floor. No BM but passing gas. Diuresing. PI in 7s. Will increase flow to 9200. (back-up controller also programmed). Continue lasix/ambulation. If co-ox >= 55% tomorrow will start to wean milrinone.   VAD parameters stable.   Quillian Quince Mccartney Chuba,MD 12:48 PM

## 2014-10-17 NOTE — Progress Notes (Signed)
Dr. Haroldine Laws at beside with [facility] and they have increased the speed from 9000 to 9200 rpm. Back up controller has been changed and dated according to the new speed. No new problems at this time Will continue to monitor.  Sandre Kitty

## 2014-10-17 NOTE — Evaluation (Signed)
Occupational Therapy Evaluation Patient Details Name: Andre Tran MRN: [id number] DOB: [birth year] Today's Date: 10/17/2014    History of Present Illness Pt s/p LVAD 10/13/14 with hospital course complicated by chest wall bleeding requiring return to OR and acute blood loss anemia. 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   Clinical Impression   Prior to admission, pt was independent in ADL and mobility.  Presents with generalized weakness, impaired balance and decreased activity tolerance interfering with ability to perform ADL and ADL transfers.  Pt moving very well with minimal physical assist with second person for safety and lines.  Pt eager to begin performing management of LVAD. Expect pt will progress well for home discharge. Will follow acutely.      Follow Up Recommendations  Home health OT;Supervision/Assistance - 24 hour    Equipment Recommendations  None recommended by OT    Recommendations for Other Services       Precautions / Restrictions Precautions Precautions: Fall;Sternal Precaution Comments: instructed in sternal precautions with mobility Restrictions Weight Bearing Restrictions: Yes Other Position/Activity Restrictions: sternal      Mobility Bed Mobility               General bed mobility comments: In chair  Transfers Overall transfer level: Needs assistance Equipment used: Pushed w/c Transfers: Sit to/from Stand Sit to Stand: Min assist;+2 safety/equipment         General transfer comment: verbal cues for sternal precautions and technique    Balance Overall balance assessment: Needs assistance   Sitting balance-Leahy Scale: Fair       Standing balance-Leahy Scale: Poor                              ADL Overall ADL's : Needs assistance/impaired Eating/Feeding: Independent;Sitting   Grooming: Set up;Sitting   Upper Body Bathing: Moderate assistance;Sitting    Lower Body Bathing: Moderate assistance;Sit to/from stand   Upper Body Dressing : Moderate assistance;Maximal assistance;Sitting   Lower Body Dressing: Moderate assistance;Sit to/from stand               Functional mobility during ADLs: Minimal assistance;+2 for safety/equipment;Wheelchair General ADL Comments: Began education in Building control surveyor and waist pack and switching LVAD from wall power to battery power. Instructed to have bag with extra clips and batteries with him at all times.  Pt's wife also observing and participating.     Vision     Perception     Praxis      Pertinent Vitals/Pain Pain Assessment: No/denies pain     Hand Dominance Left   Extremity/Trunk Assessment Upper Extremity Assessment Upper Extremity Assessment: RUE deficits/detail RUE Deficits / Details: residual hemiparesis from prior stroke, mild edema, functions as a functional assist RUE Coordination: decreased fine motor;decreased gross motor   Lower Extremity Assessment Lower Extremity Assessment: Defer to PT evaluation       Communication Communication Communication: No difficulties   Cognition Arousal/Alertness: Awake/alert Behavior During Therapy: WFL for tasks assessed/performed Overall Cognitive Status: Within Functional Limits for tasks assessed                     General Comments       Exercises       Shoulder Instructions      Home Living Family/patient expects to be discharged to:: Private residence Living Arrangements: Spouse/significant other;Children Available Help at Discharge: Family;Available  24 hours/day Type of Home: House Home Access: Stairs to enter [employer] of Steps: 5 Entrance Stairs-Rails: Right;Left;Can reach both Home Layout: 1/2 bath on main level;Two level Alternate Level Stairs-Number of Steps: flight Alternate Level Stairs-Rails: Right;Left;Can reach both Bathroom Shower/Tub: Occupational psychologist: Standard      Home Equipment: Environmental consultant - 4 wheels;Cane - single point;Bedside commode;Wheelchair - manual;Shower seat          Prior Functioning/Environment Level of Independence: Independent        Comments: Unable to work or drive since CVA     OT Diagnosis: Generalized weakness   OT Problem List: Decreased strength;Decreased activity tolerance;Impaired balance (sitting and/or standing);Decreased coordination;Decreased knowledge of use of DME or AE;Decreased knowledge of precautions;Cardiopulmonary status limiting activity;Impaired UE functional use;Increased edema   OT Treatment/Interventions: Self-care/ADL training;Energy conservation;DME and/or AE instruction;Therapeutic activities;Patient/family education;Balance training    OT Goals(Current goals can be found in the care plan section) Acute Rehab OT Goals Patient Stated Goal: get stronger and return home OT Goal Formulation: With patient Time For Goal Achievement: 10/31/14 Potential to Achieve Goals: Good ADL Goals Pt Will Perform Grooming: with supervision;standing Pt Will Perform Upper Body Bathing: with set-up;sitting Pt Will Perform Lower Body Bathing: with supervision;sit to/from stand Pt Will Perform Upper Body Dressing: with set-up;sitting Pt Will Perform Lower Body Dressing: with supervision;sit to/from stand Pt Will Transfer to Toilet: with supervision;ambulating;bedside commode (over toilet) Pt Will Perform Toileting - Clothing Manipulation and hygiene: with supervision;sit to/from stand Additional ADL Goal #1: Pt will demonstrate independence in donning and doffing holster/vest, waist strap and switching among power sources of LVAD. Additional ADL Goal #2: Pt will generalize sternal precautions in ADL and mobility. Additional ADL Goal #3: Pt will employ energy conservation strategies in ADL and mobility independently.  OT Frequency: Min 3X/week   Barriers to D/C:            Co-evaluation PT/OT/SLP  Co-Evaluation/Treatment: Yes Reason for Co-Treatment: For patient/therapist safety   OT goals addressed during session: ADL's and self-care      End of Session Equipment Utilized During Treatment: Oxygen (2L) Nurse Communication: Mobility status  Activity Tolerance: Patient tolerated treatment well Patient left: in chair;with call bell/phone within reach;with family/visitor present   Time: 3976-7341 OT Time Calculation (min): 48 min Charges:  OT General Charges $OT Visit: 1 Procedure OT Evaluation $Initial OT Evaluation Tier I: 1 Procedure G-Codes:    [name] 10/17/2014, 1:26 PM  [phone]

## 2014-10-17 NOTE — Progress Notes (Signed)
Patient ID: Andre Tran, male   DOB: 10/10/1960, 54 y.o.   MRN: [id number]  HeartMate 2 Rounding Note  Subjective:    No complaints today. He ambulated around the ICU. Eating some. No BM yet but passing gas.  Speed increased to 9200 today since PI 6-7.  Diuresing well  Co-ox 55% this am Remains on Milrinone 0.3  LVAD INTERROGATION:  HeartMate II LVAD:  Flow 3.8 liters/min, speed 9200, power 4.8, PI 5.1.  This pm  Objective:    Vital Signs:   Temp:  [98 F (36.7 C)-99.9 F (37.7 C)] 98 F (36.7 C) (04/08 1500) Pulse Rate:  [48-100] 95 (04/08 1800) Resp:  [14-37] 30 (04/08 1800) SpO2:  [86 %-100 %] 100 % (04/08 1800) Arterial Line BP: (95-122)/(70-89) 114/83 mmHg (04/08 1030) Weight:  [81.5 kg (179 lb 10.8 oz)] 81.5 kg (179 lb 10.8 oz) (04/08 0600) Last BM Date: 10/10/14 Mean arterial Pressure 88  Intake/Output:   Intake/Output Summary (Last 24 hours) at 10/17/14 1949 Last data filed at 10/17/14 1900  Gross per 24 hour  Intake 1527.57 ml  Output   2400 ml  Net -872.43 ml     Physical Exam:  General: Well appearing. No resp difficulty HEENT: normal Neck: Carotids 2+ bilat; no bruits. No lymphadenopathy or thryomegaly appreciated. Cor: Distant heart sounds with LVAD hum present. Lungs: clear Abdomen: soft, nontender, nondistended. No hepatosplenomegaly. No bruits or masses. Good bowel sounds. Extremities: mild edema Neuro: alert & orientedx3, cranial nerves grossly intact. moves all 4 extremities w/o difficulty. Affect pleasant  Telemetry: sinus 100 on amio drip  Labs: Basic Metabolic Panel:  Recent Labs Lab 10/14/14 0245 10/14/14 1700 10/14/14 1707 10/14/14 1715 10/15/14 0425 10/15/14 1304 10/16/14 0450 10/16/14 2057 10/17/14 0430  NA 132*  --  135 132* 133* 134* 132* 132* 132*  K 4.5  --  4.0 3.9 4.0 4.7 4.6 3.9 3.6  CL 104  --  100 104 105  --  101  --  97  CO2 24  --   --  21 24  --  25  --  26  GLUCOSE 127*  --  104* 95 98 144* 139* 141* 71   BUN 10  --  6 7 5*  --  10  --  14  CREATININE 0.92 0.86 0.80 0.91 0.82  --  0.93  --  0.87  CALCIUM 7.0*  --   --  7.6* 8.2*  --  8.4  --  8.4  MG 2.4 2.0  --   --  1.9  --  1.9  --  2.0  PHOS 2.5  --   --   --  2.8  --  3.1  --  2.5    Liver Function Tests:  Recent Labs Lab 10/12/14 0946 10/14/14 0245 10/14/14 1715 10/15/14 0425 10/16/14 0450  AST 31 105* 133* 124* 76*  ALT 27 36 47 53 44  ALKPHOS 78 36* 46 62 83  BILITOT 0.7 4.1* 4.8* 4.3* 2.4*  PROT 6.8 4.9* 4.7* 4.8* 5.4*  ALBUMIN 3.4* 3.3* 3.0* 2.8* 3.0*   No results for input(s): LIPASE, AMYLASE in the last 168 hours. No results for input(s): AMMONIA in the last 168 hours.  CBC:  Recent Labs Lab 10/14/14 0245 10/14/14 1700  10/15/14 0425 10/15/14 1304 10/16/14 0450 10/16/14 2057 10/17/14 0430  WBC 8.6 10.6*  --  13.0*  --  13.3*  --  14.0*  NEUTROABS 7.0  --   --  10.6*  --   --   --   --  HGB 8.5* 9.7*  < > 9.8* 9.9* 9.5* 9.9* 9.4*  HCT 23.7* 26.8*  < > 27.9* 29.0* 27.0* 29.0* 27.2*  MCV 82.3 80.5  --  80.9  --  83.6  --  82.9  PLT 96* 60*  --  63*  --  69*  --  103*  < > = values in this interval not displayed.  INR:  Recent Labs Lab 10/13/14 1640 10/14/14 0245 10/15/14 0425 10/16/14 0450 10/17/14 0430  INR NOT CALCULATED  2.22* 1.58* 1.41 1.36 1.33   LDH 707>867>544>920  Other results:  EKG:   Imaging: Dg Chest Port 1 View  10/17/2014   CLINICAL DATA:  Left ventricular assist device.  EXAM: PORTABLE CHEST - 1 VIEW  COMPARISON:  10/16/2014.  FINDINGS: Interval removal of bilateral chest tubes. Interval removal of Swan-Ganz catheter. Two right IJ line stable position. Median sternotomy. Pacemaker left ventricular assist device in stable position. Stable cardiomegaly. Low lung volumes with basilar atelectasis and/or infiltrates/pulmonary edema. Small pleural effusions cannot be excluded. No pneumothorax.  IMPRESSION: 1. Interim removal bilateral chest tubes. Interval removal Swan-Ganz  catheter. Two right IJ lines in stable position. 2. Prior median sternotomy. Cardiac pacer left ventricular assist device in stable position. 3. Low lung volumes with bibasilar atelectasis and/or infiltrates/pulmonary edema. Small pleural effusions cannot be excluded. No pneumothorax.   Electronically Signed   By: Marcello Moores  Register   On: 10/17/2014 07:56   Dg Chest Port 1 View  10/16/2014   CLINICAL DATA:  Status post placement of implantable left ventricular assist device on October 13, 2014, this acute and chronic CHF  EXAM: PORTABLE CHEST - 1 VIEW  COMPARISON:  Portable chest x-ray of October 15, 2014  FINDINGS: There has been interval extubation of the trachea and esophagus. The lung volumes remain low. The pulmonary interstitial markings have improved considerably, bilaterally. The retrocardiac region on the left remains dense. The bilateral chest tubes are unchanged in position. The cardiac silhouette is enlarged. The left ventricular assist device is unchanged in position. The permanent pacemaker defibrillator is in reasonable position radiographically. The 2 right internal jugular venous catheters are unchanged in position.  IMPRESSION: There has been interval improvement in the appearance of the pulmonary interstitium consistent with resolving interstitial edema.   Electronically Signed   By: David  Martinique   On: 10/16/2014 07:51      Medications:     Scheduled Medications: . acetaminophen  1,000 mg Oral 4 times per day   Or  . acetaminophen (TYLENOL) oral liquid 160 mg/5 mL  1,000 mg Per Tube 4 times per day  . antiseptic oral rinse  7 mL Mouth Rinse BID  . aspirin EC  81 mg Oral Daily  . atorvastatin  20 mg Oral QHS  . bisacodyl  10 mg Oral Daily   Or  . bisacodyl  10 mg Rectal Daily  . digoxin  0.125 mg Oral Daily  . docusate sodium  200 mg Oral Daily  . enoxaparin (LOVENOX) injection  40 mg Subcutaneous QHS  . guaiFENesin  1,200 mg Oral BID  . insulin aspart  0-24 Units Subcutaneous 6  times per day  . insulin detemir  15 Units Subcutaneous BID  . magnesium oxide  400 mg Oral Q breakfast  . pantoprazole  40 mg Oral Daily  . Warfarin - Physician Dosing Inpatient   Does not apply q1800     Infusions: . sodium chloride Stopped (10/17/14 0500)  . sodium chloride 250 mL (10/14/14 0644)  .  amiodarone 30 mg/hr (10/17/14 1900)  . EPINEPHrine 4 mg in dextrose 5% 250 mL infusion (16 mcg/mL) Stopped (10/16/14 2100)  . lactated ringers Stopped (10/15/14 2200)  . lactated ringers 10 mL/hr at 10/17/14 1900  . milrinone 0.3 mcg/kg/min (10/17/14 1900)  . nitroGLYCERIN       PRN Medications:  sodium chloride, albuterol, bisacodyl, levalbuterol, morphine injection, ondansetron (ZOFRAN) IV, oxyCODONE, traMADol   Assessment:   1. POD #1 S/P LVAD and reexploration for bleeding 2. Acute Blood Lossanemia. Hgb stable 3. Chronic Systolic Heart Failure with preop echo showing EF 20%, no LV thrombus, moderately dilated RV with mildly decreased systolic function, mild MR, moderate TR. chemo induced adriamycin. 4. H/O LV thrombus- on coumadin preop 5. Non Hodgkins Lymphoma 6. H/O CVA 2008  7. H/O VT/VF --St Jude ICD 2010  8. DMII 9. Postop thrombocytopenia  Plan/Discussion:    He continues to do well with stable hemodynamics on milrinone 0.3. If Co-ox is stable in the am will start to wean milrinone.  Continue diuresis  MAP was 90's this am and received one dose of hydralazine IV. May need to start an oral agent tomorrow if MAP remains elevated.  Coumadin 2.5 mg tonight  Pocket drain still draining. Keep in.  Continue mobilization  I reviewed the LVAD parameters from today, and compared the results to the patient's prior recorded data.  No programming changes were made.  The LVAD is functioning within specified parameters.  The patient performs LVAD self-test daily.  LVAD interrogation was negative for any significant power changes, alarms or PI events/speed  drops.  LVAD equipment check completed and is in good working order.  Back-up equipment present.   LVAD education done on emergency procedures and precautions and reviewed exit site care.  Length of Stay: [address]  [name] 10/17/2014, 7:49 PM

## 2014-10-18 ENCOUNTER — Inpatient Hospital Stay (HOSPITAL_COMMUNITY): Payer: Medicare Other

## 2014-10-18 LAB — CARBOXYHEMOGLOBIN
CARBOXYHEMOGLOBIN: 1.6 % — AB (ref 0.5–1.5)
Methemoglobin: 0.9 % (ref 0.0–1.5)
O2 SAT: 60.6 %
Total hemoglobin: 9.2 g/dL — ABNORMAL LOW (ref 13.5–18.0)

## 2014-10-18 LAB — PHOSPHORUS: Phosphorus: 1.7 mg/dL — ABNORMAL LOW (ref 2.3–4.6)

## 2014-10-18 LAB — BASIC METABOLIC PANEL
ANION GAP: 4 — AB (ref 5–15)
BUN: 15 mg/dL (ref 6–23)
CHLORIDE: 98 mmol/L (ref 96–112)
CO2: 31 mmol/L (ref 19–32)
Calcium: 8.2 mg/dL — ABNORMAL LOW (ref 8.4–10.5)
Creatinine, Ser: 0.94 mg/dL (ref 0.50–1.35)
GFR calc Af Amer: >90 mL/min (ref >90)
GFR calc non Af Amer: >90 mL/min (ref >90)
Glucose, Bld: 92 mg/dL (ref 70–99)
Potassium: 3.6 mmol/L (ref 3.5–5.1)
SODIUM: 133 mmol/L — AB (ref 135–145)

## 2014-10-18 LAB — GLUCOSE, CAPILLARY
GLUCOSE-CAPILLARY: 142 mg/dL — AB (ref 70–99)
Glucose-Capillary: 101 mg/dL — ABNORMAL HIGH (ref 70–99)
Glucose-Capillary: 108 mg/dL — ABNORMAL HIGH (ref 70–99)
Glucose-Capillary: 116 mg/dL — ABNORMAL HIGH (ref 70–99)
Glucose-Capillary: 91 mg/dL (ref 70–99)

## 2014-10-18 LAB — CBC
HCT: 26.4 % — ABNORMAL LOW (ref 39.0–52.0)
HEMOGLOBIN: 9.1 g/dL — AB (ref 13.0–17.0)
MCH: 29.1 pg (ref 26.0–34.0)
MCHC: 34.5 g/dL (ref 30.0–36.0)
MCV: 84.3 fL (ref 78.0–100.0)
Platelets: 131 10*3/uL — ABNORMAL LOW (ref 150–400)
RBC: 3.13 MIL/uL — AB (ref 4.22–5.81)
RDW: 15.8 % — ABNORMAL HIGH (ref 11.5–15.5)
WBC: 9.2 10*3/uL (ref 4.0–10.5)

## 2014-10-18 LAB — CULTURE, BLOOD (ROUTINE X 2)
CULTURE: NO GROWTH
Culture: NO GROWTH

## 2014-10-18 LAB — PROTIME-INR
INR: 1.46 (ref 0.00–1.49)
PROTHROMBIN TIME: 17.9 s — AB (ref 11.6–15.2)

## 2014-10-18 LAB — LACTATE DEHYDROGENASE: LDH: 371 U/L — ABNORMAL HIGH (ref 94–250)

## 2014-10-18 LAB — MAGNESIUM: Magnesium: 2.1 mg/dL (ref 1.5–2.5)

## 2014-10-18 MED ORDER — GLIPIZIDE 5 MG PO TABS
5.0000 mg | ORAL_TABLET | Freq: Every day | ORAL | Status: DC
  Filled 2014-10-18 (×2): qty 1

## 2014-10-18 MED ORDER — POTASSIUM CHLORIDE CRYS ER 20 MEQ PO TBCR
40.0000 meq | EXTENDED_RELEASE_TABLET | Freq: Once | ORAL | Status: AC
  Administered 2014-10-18: 40 meq via ORAL
  Filled 2014-10-18: qty 2

## 2014-10-18 MED ORDER — FUROSEMIDE 10 MG/ML IJ SOLN
40.0000 mg | Freq: Two times a day (BID) | INTRAMUSCULAR | Status: AC
  Administered 2014-10-18: 40 mg via INTRAVENOUS
  Filled 2014-10-18: qty 4

## 2014-10-18 MED ORDER — INSULIN ASPART 100 UNIT/ML ~~LOC~~ SOLN
0.0000 [IU] | Freq: Three times a day (TID) | SUBCUTANEOUS | Status: DC
  Administered 2014-10-18 – 2014-10-19 (×2): 2 [IU] via SUBCUTANEOUS
  Administered 2014-10-19: 8 [IU] via SUBCUTANEOUS
  Administered 2014-10-19: 4 [IU] via SUBCUTANEOUS

## 2014-10-18 MED ORDER — WARFARIN SODIUM 2.5 MG PO TABS
2.5000 mg | ORAL_TABLET | Freq: Once | ORAL | Status: AC
  Administered 2014-10-18: 2.5 mg via ORAL
  Filled 2014-10-18: qty 1

## 2014-10-18 MED ORDER — GLIPIZIDE 5 MG PO TABS
5.0000 mg | ORAL_TABLET | Freq: Every day | ORAL | Status: DC
  Administered 2014-10-18 – 2014-10-23 (×6): 5 mg via ORAL
  Filled 2014-10-18 (×7): qty 1

## 2014-10-18 MED ORDER — AMIODARONE HCL 200 MG PO TABS
200.0000 mg | ORAL_TABLET | Freq: Two times a day (BID) | ORAL | Status: DC
  Administered 2014-10-18 – 2014-10-24 (×13): 200 mg via ORAL
  Filled 2014-10-18 (×15): qty 1

## 2014-10-18 MED ORDER — HYDRALAZINE HCL 20 MG/ML IJ SOLN
10.0000 mg | INTRAMUSCULAR | Status: DC | PRN
  Administered 2014-10-18 – 2014-10-20 (×4): 10 mg via INTRAVENOUS
  Filled 2014-10-18 (×4): qty 1

## 2014-10-18 MED ORDER — GUAIFENESIN ER 600 MG PO TB12
600.0000 mg | ORAL_TABLET | Freq: Two times a day (BID) | ORAL | Status: DC
  Administered 2014-10-18 – 2014-10-24 (×12): 600 mg via ORAL
  Filled 2014-10-18 (×15): qty 1

## 2014-10-18 MED ORDER — FUROSEMIDE 10 MG/ML IJ SOLN: 40.0000 mg | Freq: Two times a day (BID) | INTRAMUSCULAR | Status: DC

## 2014-10-18 NOTE — Progress Notes (Signed)
Patient ID: Andre Tran, male   DOB: Mar 07, 1961, 54 y.o.   MRN: [id number] Heart[facility]ate 2 Rounding Note  Subjective:    No complaints. Eating, bowels working, ambulating.  1 PI event overnight noted  Co-ox 61% on milrinone 0.3  Wt is down 2 lbs. 14 lbs over preop   LVAD INTERROGATION:  HeartMate II LVAD:  Flow 4.0 liters/min, speed 9200, power 5, PI 7.1.    Objective:    Vital Signs:   Temp:  [98 F (36.7 C)-99.2 F (37.3 C)] 98.1 F (36.7 C) (04/09 0834) Pulse Rate:  [48-100] 96 (04/09 0700) Resp:  [12-37] 24 (04/09 0700) SpO2:  [86 %-100 %] 99 % (04/09 0700) Arterial Line BP: (106-114)/(76-83) 114/83 mmHg (04/08 1030) Weight:  [80.7 kg (177 lb 14.6 oz)] 80.7 kg (177 lb 14.6 oz) (04/09 0620) Last BM Date: 10/10/14 Mean arterial Pressure 86-90  Intake/Output:   Intake/Output Summary (Last 24 hours) at 10/18/14 0946 Last data filed at 10/18/14 0800  Gross per 24 hour  Intake 1166.97 ml  Output   2435 ml  Net -1268.03 ml     Physical Exam:  General: Well appearing. No resp difficulty HEENT: normal Cor: Distant heart sounds with LVAD hum present. Lungs: clear Abdomen: soft, nontender, nondistended. No hepatosplenomegaly. No bruits or masses. Good bowel sounds. Extremities: mild edema Neuro: alert & orientedx3, cranial nerves grossly intact. moves all 4 extremities w/o difficulty. Affect pleasant  Telemetry: sinus 100  Labs: Basic Metabolic Panel:  Recent Labs Lab 10/14/14 0245 10/14/14 1700  10/14/14 1715 10/15/14 0425 10/15/14 1304 10/16/14 0450 10/16/14 2057 10/17/14 0430 10/18/14 0514  NA 132*  --   < > 132* 133* 134* 132* 132* 132* 133*  K 4.5  --   < > 3.9 4.0 4.7 4.6 3.9 3.6 3.6  CL 104  --   < > 104 105  --  101  --  97 98  CO2 24  --   --  21 24  --  25  --  26 31  GLUCOSE 127*  --   < > 95 98 144* 139* 141* 71 92  BUN 10  --   < > 7 5*  --  10  --  14 15  CREATININE 0.92 0.86  < > 0.91 0.82  --  0.93  --  0.87 0.94  CALCIUM 7.0*  --   --   7.6* 8.2*  --  8.4  --  8.4 8.2*  MG 2.4 2.0  --   --  1.9  --  1.9  --  2.0 2.1  PHOS 2.5  --   --   --  2.8  --  3.1  --  2.5 1.7*  < > = values in this interval not displayed.  Liver Function Tests:  Recent Labs Lab 10/12/14 0946 10/14/14 0245 10/14/14 1715 10/15/14 0425 10/16/14 0450  AST 31 105* 133* 124* 76*  ALT 27 36 47 53 44  ALKPHOS 78 36* 46 62 83  BILITOT 0.7 4.1* 4.8* 4.3* 2.4*  PROT 6.8 4.9* 4.7* 4.8* 5.4*  ALBUMIN 3.4* 3.3* 3.0* 2.8* 3.0*   No results for input(s): LIPASE, AMYLASE in the last 168 hours. No results for input(s): AMMONIA in the last 168 hours.  CBC:  Recent Labs Lab 10/14/14 0245 10/14/14 1700  10/15/14 0425 10/15/14 1304 10/16/14 0450 10/16/14 2057 10/17/14 0430 10/18/14 0514  WBC 8.6 10.6*  --  13.0*  --  13.3*  --  14.0* 9.2  NEUTROABS 7.0  --   --  10.6*  --   --   --   --   --   HGB 8.5* 9.7*  < > 9.8* 9.9* 9.5* 9.9* 9.4* 9.1*  HCT 23.7* 26.8*  < > 27.9* 29.0* 27.0* 29.0* 27.2* 26.4*  MCV 82.3 80.5  --  80.9  --  83.6  --  82.9 84.3  PLT 96* 60*  --  63*  --  69*  --  103* 131*  < > = values in this interval not displayed.  INR:  Recent Labs Lab 10/14/14 0245 10/15/14 0425 10/16/14 0450 10/17/14 0430 10/18/14 0514  INR 1.58* 1.41 1.36 1.33 1.46   LDH 376>417>413>409>371  Other results:  EKG:   Imaging: Dg Chest Port 1 View  10/18/2014   CLINICAL DATA:  LVAD placed on 10/13/2014  EXAM: PORTABLE CHEST - 1 VIEW  COMPARISON:  Radiograph 10/17/2014  FINDINGS: LVAD device over the left upper abdomen. Left-sided pacemaker. Right central venous line unchanged. Stable enlarged cardiac silhouette. There is improvement in mild pulmonary edema pattern seen on prior. Mild left basilar atelectasis. No pneumothorax.  IMPRESSION: 1. Improvement in mild pulmonary edema pattern seen on prior. 2. Stable support apparatus and cardiomegaly.   Electronically Signed   By: Suzy Bouchard M.D.   On: 10/18/2014 08:35   Dg Chest Port 1  View  10/17/2014   CLINICAL DATA:  Left ventricular assist device.  EXAM: PORTABLE CHEST - 1 VIEW  COMPARISON:  10/16/2014.  FINDINGS: Interval removal of bilateral chest tubes. Interval removal of Swan-Ganz catheter. Two right IJ line stable position. Median sternotomy. Pacemaker left ventricular assist device in stable position. Stable cardiomegaly. Low lung volumes with basilar atelectasis and/or infiltrates/pulmonary edema. Small pleural effusions cannot be excluded. No pneumothorax.  IMPRESSION: 1. Interim removal bilateral chest tubes. Interval removal Swan-Ganz catheter. Two right IJ lines in stable position. 2. Prior median sternotomy. Cardiac pacer left ventricular assist device in stable position. 3. Low lung volumes with bibasilar atelectasis and/or infiltrates/pulmonary edema. Small pleural effusions cannot be excluded. No pneumothorax.   Electronically Signed   By: Marcello Moores  Register   On: 10/17/2014 07:56      Medications:     Scheduled Medications: . acetaminophen  1,000 mg Oral 4 times per day   Or  . acetaminophen (TYLENOL) oral liquid 160 mg/5 mL  1,000 mg Per Tube 4 times per day  . amiodarone  200 mg Oral BID  . aspirin EC  81 mg Oral Daily  . atorvastatin  20 mg Oral QHS  . digoxin  0.125 mg Oral Daily  . docusate sodium  200 mg Oral Daily  . enoxaparin (LOVENOX) injection  40 mg Subcutaneous QHS  . glipiZIDE  5 mg Oral QAC breakfast  . guaiFENesin  600 mg Oral BID  . insulin aspart  0-24 Units Subcutaneous TID WC  . magnesium oxide  400 mg Oral Q breakfast  . pantoprazole  40 mg Oral Daily  . potassium chloride  40 mEq Oral Once  . warfarin  2.5 mg Oral ONCE-1800  . Warfarin - Physician Dosing Inpatient   Does not apply q1800     Infusions: . sodium chloride Stopped (10/17/14 0500)  . sodium chloride 250 mL (10/14/14 0644)  . lactated ringers Stopped (10/15/14 2200)  . lactated ringers 10 mL/hr at 10/18/14 0400  . milrinone 0.3 mcg/kg/min (10/18/14 0642)      PRN Medications:  sodium chloride, albuterol, levalbuterol, morphine injection, ondansetron (ZOFRAN) IV,  oxyCODONE, traMADol   Assessment:   1. POD #1 S/P LVAD and reexploration for bleeding 2. Acute Blood Lossanemia. Hgb stable 3. Chronic Systolic Heart Failure with preop echo showing EF 20%, no LV thrombus, moderately dilated RV with mildly decreased systolic function, mild MR, moderate TR. chemo induced adriamycin. 4. H/O LV thrombus- on coumadin preop 5. Non Hodgkins Lymphoma 6. H/O CVA 2008  7. H/O VT/VF --St Jude ICD 2010  8. DMII 9. Postop thrombocytopenia  Plan/Discussion:    He is hemodynamically stable with Co-ox 61. Will decrease milrinone to 0.2.  MAP is still a little high. Will discuss oral antihypertensive with heart failure team. He was on losartan preop. This may come down with continued diuresis.  Volume excess: continue diuresis and KCL replacement  Postop SVT: maintaining sinus on amio. Switch to po  INR starting to bump. Coumadin 2.5 tonight.  Pocket drain still draining serosanguinous fluid. Keep in.  Continue mobilization and IS.  Diabetes: glucose under good control. Will resume glucotrol and SSI. Stop Levemir.  I reviewed the LVAD parameters from today, and compared the results to the patient's prior recorded data.  No programming changes were made.  The LVAD is functioning within specified parameters.  The patient performs LVAD self-test daily.  LVAD interrogation was negative for any significant power changes, alarms or PI events/speed drops.  LVAD equipment check completed and is in good working order.  Back-up equipment present.   LVAD education done on emergency procedures and precautions and reviewed exit site care.  Length of Stay: 8  [name] 10/18/2014, 9:46 AM

## 2014-10-18 NOTE — Progress Notes (Signed)
Patient ID: Andre Tran, male   DOB: 02-05-1961, 54 y.o.   MRN: [id number]  SICU Evening Rounds:  Hemodynamically stable on milrinone 0.2  MAP remains elevated 90's. Received some IV hydralazine today.   Diuresing.  Pump parameters ok.

## 2014-10-18 NOTE — Progress Notes (Signed)
Patient ID: Andre Tran, male   DOB: 23-Jan-1961, 54 y.o.   MRN: [id number] HeartMate 2 Rounding Note  Subjective:    POD S/P LVAD 4/4 . Taken back to OR due to chest wall bleeding, now stabilized. Has received total of 8 UPRBCs.   Extubated 4/6. Weight down 5 pounds.  Remains on amio and milrinone down to 0.2 mcg/kg/min today.  OOB to chair. Afebrile. On coumadin.    Denies SOB.   Hemoglobin 9.5>9.5 > 9.1 WBC 13.3>14 > 9.2  CO-OX 55% > 61%  LDH 376> 417>413 >409 > 371  LVAD INTERROGATION:  HeartMate II LVAD:  Flow 3.6  liters/min, speed 9200, power 4.1, PI 7.2, 4 PI events   Objective:    Vital Signs:   Temp:  [98 F (36.7 C)-99.2 F (37.3 C)] 98.1 F (36.7 C) (04/09 0834) Pulse Rate:  [93-110] 93 (04/09 1100) Resp:  [12-37] 31 (04/09 1100) SpO2:  [96 %-100 %] 97 % (04/09 1100) Weight:  [177 lb 14.6 oz (80.7 kg)] 177 lb 14.6 oz (80.7 kg) (04/09 0620) Last BM Date: 10/10/14 Mean arterial Pressure 90  Intake/Output:   Intake/Output Summary (Last 24 hours) at 10/18/14 1148 Last data filed at 10/18/14 1000  Gross per 24 hour  Intake 980.17 ml  Output   2080 ml  Net -1099.83 ml     Physical Exam: General: NAD. Sitting in chair. HEENT: normal Neck: supple. JVP 9-10 Carotids 2+ bilat; no bruits. No lymphadenopathy or thryomegaly appreciated. RIJ  swan  Cor: Mechanical heart sounds with LVAD hum present. Lungs: Rhonchi throughout.   Abdomen: soft, nontender, nondistended. No hepatosplenomegaly. No bruits or masses. Good bowel sounds. Driveline: C/D/I; driveline dressing intact Extremities: no cyanosis, clubbing, rash, edema SCDs on bilaterally.  Neuro: A and oriented x3 .  GU: Foley   Telemetry: NSR   Labs: Basic Metabolic Panel:  Recent Labs Lab 10/14/14 0245 10/14/14 1700  10/14/14 1715 10/15/14 0425 10/15/14 1304 10/16/14 0450 10/16/14 2057 10/17/14 0430 10/18/14 0514  NA 132*  --   < > 132* 133* 134* 132* 132* 132* 133*  K 4.5  --   < > 3.9 4.0 4.7  4.6 3.9 3.6 3.6  CL 104  --   < > 104 105  --  101  --  97 98  CO2 24  --   --  21 24  --  25  --  26 31  GLUCOSE 127*  --   < > 95 98 144* 139* 141* 71 92  BUN 10  --   < > 7 5*  --  10  --  14 15  CREATININE 0.92 0.86  < > 0.91 0.82  --  0.93  --  0.87 0.94  CALCIUM 7.0*  --   --  7.6* 8.2*  --  8.4  --  8.4 8.2*  MG 2.4 2.0  --   --  1.9  --  1.9  --  2.0 2.1  PHOS 2.5  --   --   --  2.8  --  3.1  --  2.5 1.7*  < > = values in this interval not displayed.  Liver Function Tests:  Recent Labs Lab 10/12/14 0946 10/14/14 0245 10/14/14 1715 10/15/14 0425 10/16/14 0450  AST 31 105* 133* 124* 76*  ALT 27 36 47 53 44  ALKPHOS 78 36* 46 62 83  BILITOT 0.7 4.1* 4.8* 4.3* 2.4*  PROT 6.8 4.9* 4.7* 4.8* 5.4*  ALBUMIN 3.4* 3.3* 3.0*  2.8* 3.0*   No results for input(s): LIPASE, AMYLASE in the last 168 hours. No results for input(s): AMMONIA in the last 168 hours.  CBC:  Recent Labs Lab 10/14/14 0245 10/14/14 1700  10/15/14 0425 10/15/14 1304 10/16/14 0450 10/16/14 2057 10/17/14 0430 10/18/14 0514  WBC 8.6 10.6*  --  13.0*  --  13.3*  --  14.0* 9.2  NEUTROABS 7.0  --   --  10.6*  --   --   --   --   --   HGB 8.5* 9.7*  < > 9.8* 9.9* 9.5* 9.9* 9.4* 9.1*  HCT 23.7* 26.8*  < > 27.9* 29.0* 27.0* 29.0* 27.2* 26.4*  MCV 82.3 80.5  --  80.9  --  83.6  --  82.9 84.3  PLT 96* 60*  --  63*  --  69*  --  103* 131*  < > = values in this interval not displayed.  INR:  Recent Labs Lab 10/14/14 0245 10/15/14 0425 10/16/14 0450 10/17/14 0430 10/18/14 0514  INR 1.58* 1.41 1.36 1.33 1.46    Other results:  EKG:   Imaging: Dg Chest Port 1 View  10/18/2014   CLINICAL DATA:  LVAD placed on 10/13/2014  EXAM: PORTABLE CHEST - 1 VIEW  COMPARISON:  Radiograph 10/17/2014  FINDINGS: LVAD device over the left upper abdomen. Left-sided pacemaker. Right central venous line unchanged. Stable enlarged cardiac silhouette. There is improvement in mild pulmonary edema pattern seen on prior. Mild  left basilar atelectasis. No pneumothorax.  IMPRESSION: 1. Improvement in mild pulmonary edema pattern seen on prior. 2. Stable support apparatus and cardiomegaly.   Electronically Signed   By: Suzy Bouchard M.D.   On: 10/18/2014 08:35   Dg Chest Port 1 View  10/17/2014   CLINICAL DATA:  Left ventricular assist device.  EXAM: PORTABLE CHEST - 1 VIEW  COMPARISON:  10/16/2014.  FINDINGS: Interval removal of bilateral chest tubes. Interval removal of Swan-Ganz catheter. Two right IJ line stable position. Median sternotomy. Pacemaker left ventricular assist device in stable position. Stable cardiomegaly. Low lung volumes with basilar atelectasis and/or infiltrates/pulmonary edema. Small pleural effusions cannot be excluded. No pneumothorax.  IMPRESSION: 1. Interim removal bilateral chest tubes. Interval removal Swan-Ganz catheter. Two right IJ lines in stable position. 2. Prior median sternotomy. Cardiac pacer left ventricular assist device in stable position. 3. Low lung volumes with bibasilar atelectasis and/or infiltrates/pulmonary edema. Small pleural effusions cannot be excluded. No pneumothorax.   Electronically Signed   By: Marcello Moores  Register   On: 10/17/2014 07:56     Medications:     Scheduled Medications: . acetaminophen  1,000 mg Oral 4 times per day   Or  . acetaminophen (TYLENOL) oral liquid 160 mg/5 mL  1,000 mg Per Tube 4 times per day  . amiodarone  200 mg Oral BID  . aspirin EC  81 mg Oral Daily  . atorvastatin  20 mg Oral QHS  . digoxin  0.125 mg Oral Daily  . docusate sodium  200 mg Oral Daily  . enoxaparin (LOVENOX) injection  40 mg Subcutaneous QHS  . furosemide  40 mg Intravenous BID  . glipiZIDE  5 mg Oral Q supper  . guaiFENesin  600 mg Oral BID  . insulin aspart  0-24 Units Subcutaneous TID WC  . magnesium oxide  400 mg Oral Q breakfast  . pantoprazole  40 mg Oral Daily  . potassium chloride  40 mEq Oral Once  . warfarin  2.5 mg Oral ONCE-1800  .  Warfarin - Physician  Dosing Inpatient   Does not apply q1800    Infusions: . sodium chloride Stopped (10/17/14 0500)  . sodium chloride 250 mL (10/14/14 0644)  . lactated ringers Stopped (10/15/14 2200)  . lactated ringers 10 mL/hr at 10/18/14 0400  . milrinone 0.2 mcg/kg/min (10/18/14 0945)    PRN Medications: sodium chloride, albuterol, levalbuterol, morphine injection, ondansetron (ZOFRAN) IV, oxyCODONE, traMADol   Assessment:  1. POD #4 S/P LVAD  2. Acute Blood Loss- Back to OR 4/5  3. Chronic Systolic Heart Failure  4.  NICM - Echo (4/2) with EF 20%, no LV thrombus, moderately dilated RV with mildly decreased systolic function, mild MR, moderate TR. chemo induced adriamycin 5.   H/O LV thrombus- 6.   Non Hodgkins Lymphoma 7.   H/O CVA 2008  8.   H/O VT/VF --St Jude ICD 2010  9.   DMII 10. SVT: Amiodarone started.   Plan/Discussion:   POD #5 S/P LVAD.   Doing ok. Afebrile. Needs aggressive pulmonary toilet.   CO-OX 61%.  Milrinone decreased to 0.25 mcg/kg/min today, to 0.125 tomorrow if stable. Some volume overload, will give Lasix 40 mg IV bid x 2 doses today and reassess.  MAP up to 90s but will hold off on meds while diuresing, consider addition hydralazine if stays high tomorrow.    Warfarin and ASA started.   I reviewed the LVAD parameters from today, and compared the results to the patient's prior recorded data.  No programming changes were made.  The LVAD is functioning within specified parameters.  The patient performs LVAD self-test daily.  LVAD interrogation was negative for any significant power changes, alarms or PI events/speed drops.  LVAD equipment check completed and is in good working order.  Back-up equipment present.   LVAD education done on emergency procedures and precautions and reviewed exit site care.  Length of Stay: [place] 10/18/2014, 11:48 AM  VAD Team --- VAD ISSUES ONLY--- Pager [phone] (7am - 7am)  Advanced Heart Failure Team  Pager [phone]  (M-F; 7a - 4p)  Please contact [facility] for night-coverage after hours (4p -7a ) and weekends on [email or web address]

## 2014-10-19 LAB — GLUCOSE, CAPILLARY
GLUCOSE-CAPILLARY: 209 mg/dL — AB (ref 70–99)
Glucose-Capillary: 120 mg/dL — ABNORMAL HIGH (ref 70–99)
Glucose-Capillary: 143 mg/dL — ABNORMAL HIGH (ref 70–99)
Glucose-Capillary: 178 mg/dL — ABNORMAL HIGH (ref 70–99)
Glucose-Capillary: 75 mg/dL (ref 70–99)

## 2014-10-19 LAB — CBC
HCT: 26.3 % — ABNORMAL LOW (ref 39.0–52.0)
Hemoglobin: 8.9 g/dL — ABNORMAL LOW (ref 13.0–17.0)
MCH: 28.8 pg (ref 26.0–34.0)
MCHC: 33.8 g/dL (ref 30.0–36.0)
MCV: 85.1 fL (ref 78.0–100.0)
Platelets: 189 10*3/uL (ref 150–400)
RBC: 3.09 MIL/uL — ABNORMAL LOW (ref 4.22–5.81)
RDW: 15.7 % — AB (ref 11.5–15.5)
WBC: 8.7 10*3/uL (ref 4.0–10.5)

## 2014-10-19 LAB — POCT I-STAT EG7
ACID-BASE DEFICIT: 2 mmol/L (ref 0.0–2.0)
BICARBONATE: 23.1 meq/L (ref 20.0–24.0)
Calcium, Ion: 1.07 mmol/L — ABNORMAL LOW (ref 1.12–1.23)
HCT: 26 % — ABNORMAL LOW (ref 39.0–52.0)
HEMOGLOBIN: 8.8 g/dL — AB (ref 13.0–17.0)
O2 SAT: 80 %
PH VEN: 7.361 — AB (ref 7.250–7.300)
PO2 VEN: 47 mmHg — AB (ref 30.0–45.0)
POTASSIUM: 3.5 mmol/L (ref 3.5–5.1)
SODIUM: 133 mmol/L — AB (ref 135–145)
TCO2: 24 mmol/L (ref 0–100)
pCO2, Ven: 41 mmHg — ABNORMAL LOW (ref 45.0–50.0)

## 2014-10-19 LAB — CARBOXYHEMOGLOBIN
Carboxyhemoglobin: 1.7 % — ABNORMAL HIGH (ref 0.5–1.5)
METHEMOGLOBIN: 0.9 % (ref 0.0–1.5)
O2 Saturation: 63.9 %
Total hemoglobin: 8.9 g/dL — ABNORMAL LOW (ref 13.5–18.0)

## 2014-10-19 LAB — BASIC METABOLIC PANEL
ANION GAP: 7 (ref 5–15)
BUN: 16 mg/dL (ref 6–23)
CALCIUM: 8.2 mg/dL — AB (ref 8.4–10.5)
CO2: 28 mmol/L (ref 19–32)
CREATININE: 0.97 mg/dL (ref 0.50–1.35)
Chloride: 100 mmol/L (ref 96–112)
GFR calc Af Amer: >90 mL/min (ref >90)
GFR calc non Af Amer: >90 mL/min (ref >90)
GLUCOSE: 119 mg/dL — AB (ref 70–99)
POTASSIUM: 4.5 mmol/L (ref 3.5–5.1)
SODIUM: 135 mmol/L (ref 135–145)

## 2014-10-19 LAB — MAGNESIUM: Magnesium: 2.2 mg/dL (ref 1.5–2.5)

## 2014-10-19 LAB — PHOSPHORUS: PHOSPHORUS: 1.7 mg/dL — AB (ref 2.3–4.6)

## 2014-10-19 LAB — PROTIME-INR
INR: 1.42 (ref 0.00–1.49)
Prothrombin Time: 17.5 seconds — ABNORMAL HIGH (ref 11.6–15.2)

## 2014-10-19 LAB — LACTATE DEHYDROGENASE: LDH: 361 U/L — ABNORMAL HIGH (ref 94–250)

## 2014-10-19 MED ORDER — ISOSORBIDE MONONITRATE ER 30 MG PO TB24
30.0000 mg | ORAL_TABLET | Freq: Every day | ORAL | Status: DC
  Administered 2014-10-19 – 2014-10-24 (×6): 30 mg via ORAL
  Filled 2014-10-19 (×6): qty 1

## 2014-10-19 MED ORDER — POTASSIUM CHLORIDE CRYS ER 20 MEQ PO TBCR
20.0000 meq | EXTENDED_RELEASE_TABLET | Freq: Once | ORAL | Status: AC
  Administered 2014-10-19: 20 meq via ORAL
  Filled 2014-10-19: qty 1

## 2014-10-19 MED ORDER — WARFARIN SODIUM 5 MG PO TABS
5.0000 mg | ORAL_TABLET | Freq: Once | ORAL | Status: AC
  Administered 2014-10-19: 5 mg via ORAL
  Filled 2014-10-19: qty 1

## 2014-10-19 MED ORDER — FUROSEMIDE 10 MG/ML IJ SOLN
40.0000 mg | Freq: Once | INTRAMUSCULAR | Status: AC
  Administered 2014-10-19: 40 mg via INTRAVENOUS

## 2014-10-19 MED ORDER — HYDRALAZINE HCL 25 MG PO TABS
25.0000 mg | ORAL_TABLET | Freq: Three times a day (TID) | ORAL | Status: DC
  Administered 2014-10-19 – 2014-10-20 (×3): 25 mg via ORAL
  Filled 2014-10-19 (×6): qty 1

## 2014-10-19 MED ORDER — SODIUM CHLORIDE 0.9 % IJ SOLN
10.0000 mL | Freq: Two times a day (BID) | INTRAMUSCULAR | Status: DC
  Administered 2014-10-20: 10 mL

## 2014-10-19 MED ORDER — SODIUM CHLORIDE 0.9 % IJ SOLN: 10.0000 mL | INTRAMUSCULAR | Status: DC | PRN

## 2014-10-19 NOTE — Progress Notes (Signed)
Patient ID: Andre Tran, male   DOB: 1960/09/19, 54 y.o.   MRN: [id number] HeartMate 2 Rounding Note  Subjective:    POD S/P LVAD 4/4 . Taken back to OR due to chest wall bleeding, now stabilized. Has received total of 8 UPRBCs.   Extubated 4/6. Weight down 4 pounds.  Milrinone down to 0.1 mcg/kg/min today.  Walked today.  Afebrile. On coumadin.    Denies SOB. CVP 5. Diuresed with IV Lasix yesterday.   Hemoglobin 9.5>9.5 > 9.1 > 8.9 WBC 13.3>14 > 9.2 > 8.7  CO-OX 55% > 61% > 64% LDH 376> 417>413 >409 > 371 > 361  LVAD INTERROGATION:  HeartMate II LVAD:  Flow 4.8  liters/min, speed 9200, power 5.4, PI 6.9, 7 PI events/24 hrs  Objective:    Vital Signs:   Temp:  [98 F (36.7 C)-99.1 F (37.3 C)] 98.6 F (37 C) (04/10 0749) Pulse Rate:  [85-106] 102 (04/10 1000) Resp:  [9-34] 19 (04/10 1100) SpO2:  [97 %-100 %] 97 % (04/10 0900) Weight:  [173 lb 1 oz (78.5 kg)] 173 lb 1 oz (78.5 kg) (04/10 0600) Last BM Date: 10/18/14 Mean arterial Pressure 90  Intake/Output:   Intake/Output Summary (Last 24 hours) at 10/19/14 1113 Last data filed at 10/19/14 1000  Gross per 24 hour  Intake 1533.78 ml  Output   2845 ml  Net -1311.22 ml     Physical Exam: General: NAD. Sitting in chair. HEENT: normal Neck: supple. JVP 8 Carotids 2+ bilat; no bruits. No lymphadenopathy or thryomegaly appreciated. RIJ  swan  Cor: Mechanical heart sounds with LVAD hum present. Lungs: Rhonchi throughout.   Abdomen: soft, nontender, nondistended. No hepatosplenomegaly. No bruits or masses. Good bowel sounds. Driveline: C/D/I; driveline dressing intact Extremities: no cyanosis, clubbing, rash, edema SCDs on bilaterally.  Neuro: A and oriented x3 .  GU: Foley   Telemetry: NSR   Labs: Basic Metabolic Panel:  Recent Labs Lab 10/15/14 0425  10/16/14 0450 10/16/14 2057 10/17/14 0430 10/18/14 0514 10/19/14 0430  NA 133*  < > 132* 132* 132* 133* 135  K 4.0  < > 4.6 3.9 3.6 3.6 4.5  CL 105  --  101   --  97 98 100  CO2 24  --  25  --  26 31 28   GLUCOSE 98  < > 139* 141* 71 92 119*  BUN 5*  --  10  --  14 15 16   CREATININE 0.82  --  0.93  --  0.87 0.94 0.97  CALCIUM 8.2*  --  8.4  --  8.4 8.2* 8.2*  MG 1.9  --  1.9  --  2.0 2.1 2.2  PHOS 2.8  --  3.1  --  2.5 1.7* 1.7*  < > = values in this interval not displayed.  Liver Function Tests:  Recent Labs Lab 10/14/14 0245 10/14/14 1715 10/15/14 0425 10/16/14 0450  AST 105* 133* 124* 76*  ALT 36 47 53 44  ALKPHOS 36* 46 62 83  BILITOT 4.1* 4.8* 4.3* 2.4*  PROT 4.9* 4.7* 4.8* 5.4*  ALBUMIN 3.3* 3.0* 2.8* 3.0*   No results for input(s): LIPASE, AMYLASE in the last 168 hours. No results for input(s): AMMONIA in the last 168 hours.  CBC:  Recent Labs Lab 10/14/14 0245  10/15/14 0425  10/16/14 0450 10/16/14 2057 10/17/14 0430 10/18/14 0514 10/19/14 0430  WBC 8.6  < > 13.0*  --  13.3*  --  14.0* 9.2 8.7  NEUTROABS 7.0  --  10.6*  --   --   --   --   --   --   HGB 8.5*  < > 9.8*  < > 9.5* 9.9* 9.4* 9.1* 8.9*  HCT 23.7*  < > 27.9*  < > 27.0* 29.0* 27.2* 26.4* 26.3*  MCV 82.3  < > 80.9  --  83.6  --  82.9 84.3 85.1  PLT 96*  < > 63*  --  69*  --  103* 131* 189  < > = values in this interval not displayed.  INR:  Recent Labs Lab 10/15/14 0425 10/16/14 0450 10/17/14 0430 10/18/14 0514 10/19/14 0430  INR 1.41 1.36 1.33 1.46 1.42    Other results:  EKG:   Imaging: Dg Chest Port 1 View  10/18/2014   CLINICAL DATA:  LVAD placed on 10/13/2014  EXAM: PORTABLE CHEST - 1 VIEW  COMPARISON:  Radiograph 10/17/2014  FINDINGS: LVAD device over the left upper abdomen. Left-sided pacemaker. Right central venous line unchanged. Stable enlarged cardiac silhouette. There is improvement in mild pulmonary edema pattern seen on prior. Mild left basilar atelectasis. No pneumothorax.  IMPRESSION: 1. Improvement in mild pulmonary edema pattern seen on prior. 2. Stable support apparatus and cardiomegaly.   Electronically Signed   By:  Suzy Bouchard M.D.   On: 10/18/2014 08:35     Medications:     Scheduled Medications: . amiodarone  200 mg Oral BID  . aspirin EC  81 mg Oral Daily  . atorvastatin  20 mg Oral QHS  . digoxin  0.125 mg Oral Daily  . docusate sodium  200 mg Oral Daily  . enoxaparin (LOVENOX) injection  40 mg Subcutaneous QHS  . glipiZIDE  5 mg Oral Q supper  . guaiFENesin  600 mg Oral BID  . hydrALAZINE  25 mg Oral 3 times per day  . insulin aspart  0-24 Units Subcutaneous TID WC  . isosorbide mononitrate  30 mg Oral Daily  . magnesium oxide  400 mg Oral Q breakfast  . pantoprazole  40 mg Oral Daily  . warfarin  5 mg Oral ONCE-1800  . Warfarin - Physician Dosing Inpatient   Does not apply q1800    Infusions: . sodium chloride Stopped (10/17/14 0500)  . sodium chloride 250 mL (10/14/14 0644)  . lactated ringers Stopped (10/15/14 2200)  . lactated ringers 10 mL/hr at 10/19/14 0700  . milrinone 0.1 mcg/kg/min (10/19/14 0900)    PRN Medications: sodium chloride, albuterol, hydrALAZINE, levalbuterol, morphine injection, ondansetron (ZOFRAN) IV, oxyCODONE, traMADol   Assessment:  1. POD #4 S/P LVAD  2. Acute Blood Loss- Back to OR 4/5  3. Chronic Systolic Heart Failure  4.  NICM - Echo (4/2) with EF 20%, no LV thrombus, moderately dilated RV with mildly decreased systolic function, mild MR, moderate TR. chemo induced adriamycin 5.   H/O LV thrombus- 6.   Non Hodgkins Lymphoma 7.   H/O CVA 2008  8.   H/O VT/VF --St Jude ICD 2010  9.   DMII 10. SVT: Amiodarone started.   Plan/Discussion:   POD #5 S/P LVAD.   Doing ok. Afebrile. Walking.   CO-OX 64%.  Milrinone decreased to 0.1 mcg/kg/min today, will stop if tolerates. Volume better, CVP 5 though still above baseline weight.  Agree with Lasix 40 mg IV x 1 today.  MAP 90s, will add hydralazine 25 mg tid + Imdur 30 daily.   Warfarin and ASA started.   I reviewed the LVAD parameters from today, and compared  the results to the  patient's prior recorded data.  No programming changes were made.  The LVAD is functioning within specified parameters.  The patient performs LVAD self-test daily.  LVAD interrogation was negative for any significant power changes, alarms or PI events/speed drops.  LVAD equipment check completed and is in good working order.  Back-up equipment present.   LVAD education done on emergency procedures and precautions and reviewed exit site care.  Length of Stay: [place] 10/19/2014, 11:13 AM  VAD Team --- VAD ISSUES ONLY--- Pager [phone] (7am - 7am)  Advanced Heart Failure Team  Pager [phone] (M-F; 7a - 4p)  Please contact [facility] for night-coverage after hours (4p -7a ) and weekends on [email or web address]

## 2014-10-19 NOTE — Progress Notes (Signed)
Patient ID: WINN MUEHL, male   DOB: Oct 21, 1960, 54 y.o.   MRN: [id number]  HeartMate 2 Rounding Note  Subjective:    No complaints  Ambulating  CVP 4-5 this am Co-ox 64% on milrinone 0.2    LVAD INTERROGATION:  HeartMate II LVAD:  Flow 5.5 liters/min, speed 9200, power 5.6, PI 7.0.  7 PI events last night with relatively small drops in PI. Objective:    Vital Signs:   Temp:  [98 F (36.7 C)-99.1 F (37.3 C)] 98.6 F (37 C) (04/10 0749) Pulse Rate:  [89-110] 102 (04/10 0700) Resp:  [9-34] 26 (04/10 0700) SpO2:  [96 %-100 %] 100 % (04/10 0700) Weight:  [78.5 kg (173 lb 1 oz)] 78.5 kg (173 lb 1 oz) (04/10 0600) Last BM Date: 10/18/14 Mean arterial Pressure 96  Received one dose of prn hydralazine overnight  Intake/Output:   Intake/Output Summary (Last 24 hours) at 10/19/14 0816 Last data filed at 10/19/14 0700  Gross per 24 hour  Intake 1694.15 ml  Output   2360 ml  Net -665.85 ml     Physical Exam:  General: Well appearing. No resp difficulty HEENT: normal Neck: Carotids 2+ bilat; no bruits. No lymphadenopathy or thryomegaly appreciated. Cor: Distant heart sounds with LVAD hum present. Lungs: clear Abdomen: soft, nontender, nondistended. No hepatosplenomegaly. No bruits or masses. Good bowel sounds. Extremities: mild edema Neuro: alert & orientedx3, cranial nerves grossly intact. moves all 4 extremities w/o difficulty. Affect pleasant  Telemetry: sinus 105  Labs: Basic Metabolic Panel:  Recent Labs Lab 10/15/14 0425  10/16/14 0450 10/16/14 2057 10/17/14 0430 10/18/14 0514 10/19/14 0430  NA 133*  < > 132* 132* 132* 133* 135  K 4.0  < > 4.6 3.9 3.6 3.6 4.5  CL 105  --  101  --  97 98 100  CO2 24  --  25  --  26 31 28   GLUCOSE 98  < > 139* 141* 71 92 119*  BUN 5*  --  10  --  14 15 16   CREATININE 0.82  --  0.93  --  0.87 0.94 0.97  CALCIUM 8.2*  --  8.4  --  8.4 8.2* 8.2*  MG 1.9  --  1.9  --  2.0 2.1 2.2  PHOS 2.8  --  3.1  --  2.5 1.7* 1.7*  <  > = values in this interval not displayed.  Liver Function Tests:  Recent Labs Lab 10/12/14 0946 10/14/14 0245 10/14/14 1715 10/15/14 0425 10/16/14 0450  AST 31 105* 133* 124* 76*  ALT 27 36 47 53 44  ALKPHOS 78 36* 46 62 83  BILITOT 0.7 4.1* 4.8* 4.3* 2.4*  PROT 6.8 4.9* 4.7* 4.8* 5.4*  ALBUMIN 3.4* 3.3* 3.0* 2.8* 3.0*   No results for input(s): LIPASE, AMYLASE in the last 168 hours. No results for input(s): AMMONIA in the last 168 hours.  CBC:  Recent Labs Lab 10/14/14 0245  10/15/14 0425  10/16/14 0450 10/16/14 2057 10/17/14 0430 10/18/14 0514 10/19/14 0430  WBC 8.6  < > 13.0*  --  13.3*  --  14.0* 9.2 8.7  NEUTROABS 7.0  --  10.6*  --   --   --   --   --   --   HGB 8.5*  < > 9.8*  < > 9.5* 9.9* 9.4* 9.1* 8.9*  HCT 23.7*  < > 27.9*  < > 27.0* 29.0* 27.2* 26.4* 26.3*  MCV 82.3  < > 80.9  --  83.6  --  82.9 84.3 85.1  PLT 96*  < > 63*  --  69*  --  103* 131* 189  < > = values in this interval not displayed.  INR:  Recent Labs Lab 10/15/14 0425 10/16/14 0450 10/17/14 0430 10/18/14 0514 10/19/14 0430  INR 1.41 1.36 1.33 1.46 1.42   LDH 376>417>413>409>361  Other results:  EKG:   Imaging: Dg Chest Port 1 View  10/18/2014   CLINICAL DATA:  LVAD placed on 10/13/2014  EXAM: PORTABLE CHEST - 1 VIEW  COMPARISON:  Radiograph 10/17/2014  FINDINGS: LVAD device over the left upper abdomen. Left-sided pacemaker. Right central venous line unchanged. Stable enlarged cardiac silhouette. There is improvement in mild pulmonary edema pattern seen on prior. Mild left basilar atelectasis. No pneumothorax.  IMPRESSION: 1. Improvement in mild pulmonary edema pattern seen on prior. 2. Stable support apparatus and cardiomegaly.   Electronically Signed   By: Suzy Bouchard M.D.   On: 10/18/2014 08:35      Medications:     Scheduled Medications: . amiodarone  200 mg Oral BID  . aspirin EC  81 mg Oral Daily  . atorvastatin  20 mg Oral QHS  . digoxin  0.125 mg Oral Daily   . docusate sodium  200 mg Oral Daily  . enoxaparin (LOVENOX) injection  40 mg Subcutaneous QHS  . furosemide  40 mg Intravenous BID  . furosemide  40 mg Intravenous Once  . glipiZIDE  5 mg Oral Q supper  . guaiFENesin  600 mg Oral BID  . insulin aspart  0-24 Units Subcutaneous TID WC  . magnesium oxide  400 mg Oral Q breakfast  . pantoprazole  40 mg Oral Daily  . potassium chloride  20 mEq Oral Once  . warfarin  5 mg Oral ONCE-1800  . Warfarin - Physician Dosing Inpatient   Does not apply q1800     Infusions: . sodium chloride Stopped (10/17/14 0500)  . sodium chloride 250 mL (10/14/14 0644)  . lactated ringers Stopped (10/15/14 2200)  . lactated ringers 10 mL/hr at 10/19/14 0700  . milrinone 0.2 mcg/kg/min (10/19/14 0700)     PRN Medications:  sodium chloride, albuterol, hydrALAZINE, levalbuterol, morphine injection, ondansetron (ZOFRAN) IV, oxyCODONE, traMADol   Assessment:   1. POD #1 S/P LVAD and reexploration for bleeding 2. Acute Blood Lossanemia. Hgb stable 3. Chronic Systolic Heart Failure with preop echo showing EF 20%, no LV thrombus, moderately dilated RV with mildly decreased systolic function, mild MR, moderate TR. chemo induced adriamycin. 4. H/O LV thrombus- on coumadin preop 5. Non Hodgkins Lymphoma 6. H/O CVA 2008  7. H/O VT/VF --St Jude ICD 2010  8. DMII 9. Postop thrombocytopenia: resolved  Plan/Discussion:    POD 6  He continues to do well with stable hemodynamics on milrinone 0.2. Will decrease to 0.1 and probably turn off later today.  Diuresing and weight down 5 lbs yesterday. Still 10 lbs over preop.  MAP still on the high side. Prn hydralazine. Will probably need oral agent.  INR unchanged today. Will give 5 mg coumadin.  Pocket drain with low output. Will remove it.  Continue mobilization. Hopefully can get off milrinone today and get central line out in the am and transfer to 2W stepdown.  I reviewed the LVAD  parameters from today, and compared the results to the patient's prior recorded data.  No programming changes were made.  The LVAD is functioning within specified parameters.  The patient performs LVAD self-test daily.  LVAD interrogation was negative for any significant power changes, alarms or PI events/speed drops.  LVAD equipment check completed and is in good working order.  Back-up equipment present.   LVAD education done on emergency procedures and precautions and reviewed exit site care.  Length of Stay: [address] K Bartle 10/19/2014, 8:16 AM

## 2014-10-19 NOTE — Progress Notes (Signed)
Patient ID: Andre Tran, male   DOB: 15-Mar-1961, 54 y.o.   MRN: [id number]  SICU Evening Rounds:  Hemodynamically stable MAP is better on oral hydralazine  Will DC milrinone and check Co-ox in am.

## 2014-10-20 LAB — GLUCOSE, CAPILLARY
GLUCOSE-CAPILLARY: 114 mg/dL — AB (ref 70–99)
GLUCOSE-CAPILLARY: 142 mg/dL — AB (ref 70–99)
Glucose-Capillary: 134 mg/dL — ABNORMAL HIGH (ref 70–99)
Glucose-Capillary: 131 mg/dL — ABNORMAL HIGH (ref 70–99)

## 2014-10-20 LAB — PROTIME-INR
INR: 1.43 (ref 0.00–1.49)
PROTHROMBIN TIME: 17.6 s — AB (ref 11.6–15.2)

## 2014-10-20 LAB — CBC
HEMATOCRIT: 26.6 % — AB (ref 39.0–52.0)
HEMOGLOBIN: 8.8 g/dL — AB (ref 13.0–17.0)
MCH: 28.8 pg (ref 26.0–34.0)
MCHC: 33.1 g/dL (ref 30.0–36.0)
MCV: 86.9 fL (ref 78.0–100.0)
PLATELETS: 245 10*3/uL (ref 150–400)
RBC: 3.06 MIL/uL — ABNORMAL LOW (ref 4.22–5.81)
RDW: 15.9 % — ABNORMAL HIGH (ref 11.5–15.5)
WBC: 10.3 10*3/uL (ref 4.0–10.5)

## 2014-10-20 LAB — PHOSPHORUS: Phosphorus: 2.3 mg/dL (ref 2.3–4.6)

## 2014-10-20 LAB — BASIC METABOLIC PANEL
ANION GAP: 6 (ref 5–15)
BUN: 15 mg/dL (ref 6–23)
CO2: 29 mmol/L (ref 19–32)
Calcium: 8.4 mg/dL (ref 8.4–10.5)
Chloride: 99 mmol/L (ref 96–112)
Creatinine, Ser: 0.94 mg/dL (ref 0.50–1.35)
GFR calc non Af Amer: >90 mL/min (ref >90)
Glucose, Bld: 123 mg/dL — ABNORMAL HIGH (ref 70–99)
Potassium: 4.4 mmol/L (ref 3.5–5.1)
Sodium: 134 mmol/L — ABNORMAL LOW (ref 135–145)

## 2014-10-20 LAB — CARBOXYHEMOGLOBIN
CARBOXYHEMOGLOBIN: 1.6 % — AB (ref 0.5–1.5)
Methemoglobin: 0.8 % (ref 0.0–1.5)
O2 SAT: 57.2 %
TOTAL HEMOGLOBIN: 13.5 g/dL (ref 13.5–18.0)

## 2014-10-20 LAB — MAGNESIUM: Magnesium: 2.1 mg/dL (ref 1.5–2.5)

## 2014-10-20 LAB — BRAIN NATRIURETIC PEPTIDE: B NATRIURETIC PEPTIDE 5: 535.3 pg/mL — AB (ref 0.0–100.0)

## 2014-10-20 LAB — LACTATE DEHYDROGENASE: LDH: 378 U/L — ABNORMAL HIGH (ref 94–250)

## 2014-10-20 MED ORDER — SODIUM CHLORIDE 0.9 % IV SOLN: 250.0000 mL | INTRAVENOUS | Status: DC | PRN

## 2014-10-20 MED ORDER — WARFARIN SODIUM 5 MG PO TABS
5.0000 mg | ORAL_TABLET | Freq: Once | ORAL | Status: AC
  Administered 2014-10-20: 5 mg via ORAL
  Filled 2014-10-20: qty 1

## 2014-10-20 MED ORDER — MOVING RIGHT ALONG BOOK
Freq: Once | Status: AC
  Administered 2014-10-20: 21:00:00
  Filled 2014-10-20: qty 1

## 2014-10-20 MED ORDER — FUROSEMIDE 40 MG PO TABS
40.0000 mg | ORAL_TABLET | Freq: Every day | ORAL | Status: DC
  Administered 2014-10-20 – 2014-10-22 (×3): 40 mg via ORAL
  Filled 2014-10-20 (×3): qty 1

## 2014-10-20 MED ORDER — MAGNESIUM OXIDE 400 (241.3 MG) MG PO TABS
400.0000 mg | ORAL_TABLET | Freq: Every day | ORAL | Status: DC
  Administered 2014-10-20 – 2014-10-24 (×5): 400 mg via ORAL
  Filled 2014-10-20 (×6): qty 1

## 2014-10-20 MED ORDER — SODIUM CHLORIDE 0.9 % IJ SOLN
3.0000 mL | Freq: Two times a day (BID) | INTRAMUSCULAR | Status: DC
  Administered 2014-10-20 – 2014-10-23 (×7): 3 mL via INTRAVENOUS

## 2014-10-20 MED ORDER — SODIUM CHLORIDE 0.9 % IJ SOLN: 3.0000 mL | INTRAMUSCULAR | Status: DC | PRN

## 2014-10-20 MED ORDER — INSULIN ASPART 100 UNIT/ML ~~LOC~~ SOLN
0.0000 [IU] | Freq: Three times a day (TID) | SUBCUTANEOUS | Status: DC
  Administered 2014-10-20 – 2014-10-21 (×4): 2 [IU] via SUBCUTANEOUS
  Administered 2014-10-22: 4 [IU] via SUBCUTANEOUS
  Administered 2014-10-22 – 2014-10-23 (×3): 2 [IU] via SUBCUTANEOUS
  Administered 2014-10-23: 4 [IU] via SUBCUTANEOUS
  Administered 2014-10-23 – 2014-10-24 (×3): 2 [IU] via SUBCUTANEOUS

## 2014-10-20 MED ORDER — HYDRALAZINE HCL 25 MG PO TABS
37.5000 mg | ORAL_TABLET | Freq: Three times a day (TID) | ORAL | Status: DC
  Administered 2014-10-20 – 2014-10-24 (×13): 37.5 mg via ORAL
  Filled 2014-10-20 (×16): qty 1.5

## 2014-10-20 MED ORDER — ASPIRIN EC 325 MG PO TBEC
325.0000 mg | DELAYED_RELEASE_TABLET | Freq: Every day | ORAL | Status: DC
  Administered 2014-10-20 – 2014-10-24 (×5): 325 mg via ORAL
  Filled 2014-10-20 (×5): qty 1

## 2014-10-20 NOTE — Progress Notes (Signed)
LVAD Education Note:   Wife reported to patient's bedside briefly today when his nurse was going to have her observe dressing change and begin teaching sterile technique. She stepped out to take a phone call and was called into work and did not participate in care. Attempted to call her cell phone without answer and no option to leave voicemail.   Need to set up education time with her and patient's daughter for home/discharge education. F/U with them tomorrow.

## 2014-10-20 NOTE — Progress Notes (Signed)
UR Completed.  [id number] 10/20/2014

## 2014-10-20 NOTE — Progress Notes (Signed)
CSW met with patient and wife at bedside. Patient was in great spirits and stated he feels "fantastic". Patient appears very motivated for recovery and spoke about his strong relationship with his wife. Patient reports plans to be transferred to 2W this afternoon and ready for continued recovery and improved functioning. CSW provided support and will continue to follow throughout recovery. Raquel Sarna, [place]

## 2014-10-20 NOTE — Progress Notes (Signed)
Patient ID: Andre Tran, male   DOB: [birth year], 54 y.o.   MRN: [id number] HeartMate 2 Rounding Note  Subjective:    No complaints. Slept well.  CVP 8 Co-ox 57% Weight down 2 lbs to 170. preop 163 LDH 376>417>413>409>361>378  LVAD INTERROGATION:  HeartMate II LVAD:  Flow 4.1 liters/min, speed 9200, power 5.07, PI 7.0.  3 PI events yesterday afternoon, 1 overnight  Objective:    Vital Signs:   Temp:  [98.2 F (36.8 C)-99.2 F (37.3 C)] 98.7 F (37.1 C) (04/11 0342) Pulse Rate:  [85-113] 94 (04/11 0700) Resp:  [18-35] 26 (04/11 0700) SpO2:  [90 %-100 %] 100 % (04/11 0700) Weight:  [77.5 kg (170 lb 13.7 oz)] 77.5 kg (170 lb 13.7 oz) (04/11 0600) Last BM Date: 10/18/14 Mean arterial Pressure 102 overnight, 84 this am after prn hydralazine  Intake/Output:   Intake/Output Summary (Last 24 hours) at 10/20/14 0731 Last data filed at 10/20/14 0700  Gross per 24 hour  Intake 1618.86 ml  Output   1770 ml  Net -151.14 ml     Physical Exam: General: Well appearing. No resp difficulty HEENT: normal Neck: Carotids 2+ bilat; no bruits. No lymphadenopathy or thryomegaly appreciated. Cor: Distant heart sounds with LVAD hum present. Lungs: clear Abdomen: soft, nontender, nondistended. No hepatosplenomegaly. No bruits or masses. Good bowel sounds. Extremities: mild edema Neuro: alert & orientedx3, cranial nerves grossly intact. moves all 4 extremities w/o difficulty. Affect pleasant  Telemetry: sinus 90  Labs: Basic Metabolic Panel:  Recent Labs Lab 10/16/14 0450 10/16/14 2057 10/17/14 0430 10/18/14 0514 10/19/14 0430 10/20/14 0407  NA 132* 132* 132* 133* 135 134*  K 4.6 3.9 3.6 3.6 4.5 4.4  CL 101  --  97 98 100 99  CO2 25  --  26 31 28 29   GLUCOSE 139* 141* 71 92 119* 123*  BUN 10  --  14 15 16 15   CREATININE 0.93  --  0.87 0.94 0.97 0.94  CALCIUM 8.4  --  8.4 8.2* 8.2* 8.4  MG 1.9  --  2.0 2.1 2.2 2.1  PHOS 3.1  --  2.5 1.7* 1.7* 2.3    Liver Function  Tests:  Recent Labs Lab 10/14/14 0245 10/14/14 1715 10/15/14 0425 10/16/14 0450  AST 105* 133* 124* 76*  ALT 36 47 53 44  ALKPHOS 36* 46 62 83  BILITOT 4.1* 4.8* 4.3* 2.4*  PROT 4.9* 4.7* 4.8* 5.4*  ALBUMIN 3.3* 3.0* 2.8* 3.0*   No results for input(s): LIPASE, AMYLASE in the last 168 hours. No results for input(s): AMMONIA in the last 168 hours.  CBC:  Recent Labs Lab 10/14/14 0245  10/15/14 0425  10/16/14 0450 10/16/14 2057 10/17/14 0430 10/18/14 0514 10/19/14 0430 10/20/14 0407  WBC 8.6  < > 13.0*  --  13.3*  --  14.0* 9.2 8.7 10.3  NEUTROABS 7.0  --  10.6*  --   --   --   --   --   --   --   HGB 8.5*  < > 9.8*  < > 9.5* 9.9* 9.4* 9.1* 8.9* 8.8*  HCT 23.7*  < > 27.9*  < > 27.0* 29.0* 27.2* 26.4* 26.3* 26.6*  MCV 82.3  < > 80.9  --  83.6  --  82.9 84.3 85.1 86.9  PLT 96*  < > 63*  --  69*  --  103* 131* 189 245  < > = values in this interval not displayed.  INR:  Recent Labs  Lab 10/16/14 0450 10/17/14 0430 10/18/14 0514 10/19/14 0430 10/20/14 0407  INR 1.36 1.33 1.46 1.42 1.43    Other results:  EKG:   Imaging:  No results found.   Medications:     Scheduled Medications: . amiodarone  200 mg Oral BID  . aspirin EC  81 mg Oral Daily  . atorvastatin  20 mg Oral QHS  . docusate sodium  200 mg Oral Daily  . enoxaparin (LOVENOX) injection  40 mg Subcutaneous QHS  . furosemide  40 mg Oral Daily  . glipiZIDE  5 mg Oral Q supper  . guaiFENesin  600 mg Oral BID  . hydrALAZINE  37.5 mg Oral 3 times per day  . insulin aspart  0-24 Units Subcutaneous TID WC  . isosorbide mononitrate  30 mg Oral Daily  . magnesium oxide  400 mg Oral Q breakfast  . pantoprazole  40 mg Oral Daily  . sodium chloride  10-40 mL Intracatheter Q12H  . Warfarin - Physician Dosing Inpatient   Does not apply q1800     Infusions: . sodium chloride Stopped (10/17/14 0500)  . sodium chloride 250 mL (10/14/14 0644)     PRN Medications:  sodium chloride, albuterol,  hydrALAZINE, levalbuterol, morphine injection, ondansetron (ZOFRAN) IV, oxyCODONE, sodium chloride, traMADol   Assessment:   1. POD #7 S/P LVAD and reexploration for bleeding 2. Acute Blood Lossanemia. Hgb stable 3. Chronic Systolic Heart Failure with preop echo showing EF 20%, no LV thrombus, moderately dilated RV with mildly decreased systolic function, mild MR, moderate TR. chemo induced adriamycin. 4. H/O LV thrombus- on coumadin preop 5. Non Hodgkins Lymphoma 6. H/O CVA 2008  7. H/O VT/VF --St Jude ICD 2010  8. DMII 9. Postop thrombocytopenia: resolved  Plan/Discussion:    He remains hemodynamically stable off milrinone overnight with Co-ox 57. Pump parameters ok. PI still 6-7 so plan ramp echo  MAP remains high. Hydralazine increased  Continue coumadin and ASA.  Continue diuresis.  DC central line and start saline well  Transfer to 2W.  I reviewed the LVAD parameters from today, and compared the results to the patient's prior recorded data.  No programming changes were made.  The LVAD is functioning within specified parameters.  The patient performs LVAD self-test daily.  LVAD interrogation was negative for any significant power changes, alarms or PI events/speed drops.  LVAD equipment check completed and is in good working order.  Back-up equipment present.   LVAD education done on emergency procedures and precautions and reviewed exit site care.  Length of Stay: [address]  [name] 10/20/2014, 7:31 AM

## 2014-10-20 NOTE — Progress Notes (Addendum)
Admitted 10/10/14 due to pre-VAD optimization.   HeartMate II LVAD implated on 10/13/14 by Dr. Cyndia Bent for DT VAD.   Vital signs: Temp: 98.1 - 99.0 F HR:  85 - 113, SR/ST MAP Doppler: 84 - 95  O2 Sat: 100 (2 L [place]) RR: 25 - 30 Wt in lbs:   163 > ... > 179 > 177 > 173 > 170   LVAD interrogation reveals:  Speed:  9200 Flow: 4.4 Power:  5.2 PI:  7 - 7.2 Alarms:  none  Events: 3 PI's today so far and 3 yesterday  Fixed speed:  9200 Low speed limit:  8600  I reviewed the LVAD parameters from today, and compared the results to the patient's prior recorded data. No programming changes were made. The LVAD is functioning within specified parameters. The patient is performing LVAD self-test daily. LVAD interrogation was negative for any significant power changes or alarms with few PI events. LVAD equipment check completed and is in good working order. Back-up equipment present.    Drive Line:  Dressing intact with attachment device in place. Daily dressing changes. Planning for wife to practice sterile technique and observe dressing change today with plans to take over once on 2W with support from VAD Coordinator and Bedside Nursing Staff.   Labs:  LDH trend: 376 > 417 (peak) >  ... > 371 > 361 > 378  INR trend:  2.2 > ... > 1.46 > 1.42 > 1.43  WBC:  10 > 9.7 > ... > 14 > 8.2 > 8.7 > 10.3  Hgb: 9.4 > 8.2 > 7.4 > ... > 9.8 > 9.5 > 9.4  Co-Ox:  60.6 > 63.9 > 57.2 (Off milrinone)  Blood products: 1st OR 10/13/14:  3 PCs, 5 FFP, 1 plt 2nd OR 10/13/14: 5 PCs, 4 FFP, 1 plt ICU:  4/4 - 10/14/14:  2 PCs, 1 plt, 2 cryo  Gtts: Off  Nitric Oxide:  Off 10/16/14  ASA Started: 10/17/14  Warfarin Started: 10/16/14   Andre Tran is sitting up in a chair this morning looking well. Poor appetite and isn't eating much. Reports that his pain is controlled and he feels great considering the surgery he just had. Discussed with him options today regarding wearable accessories for VAD equipment. He has been using  the belt attachment and holster vest up until now; showed him the neck-strap as this has been reportedly more comfortable while they are in a hospital gown and does not rub on their incisions/dressings. Verbalized he liked this option.   Discussed with him plan going forward this week regarding education and planning for going home. He verbalized to me how to do self tests of his equipment and how often it needed to be done, how to change power sources and how to check his battery life.    Plan/Recommendations & D/C Needs as discussed with team: 1.  Need to d/w wife today regarding discharge education day for her and secondary caregiver (daughter). Will come back later today when his wife is here to discuss with them together. Doing well post op and anticipate discharge soon.   2.  Encourage nutrition.   3.  MAP up: med titrations taking place. RAMP study planned for tomorrow.   4.  Continue daily dressing changes with wife's education as focus as they transition to 2W PTCU  5.  Mobility and pulmonary toilet   6.  Home equipment is on order now and will be maintained by our [employer] as routinely  done prior to release home.   7. Nursing staff to assist with reinforcing sterile technique with his wife in regards to exit site care and signs/symptoms of infection. Continue discharge education during this post-operative period.

## 2014-10-20 NOTE — Progress Notes (Signed)
Physical Therapy Treatment Patient Details Name: Andre Tran MRN: [id number] DOB: 02-17-1961 Today's Date: 10/20/2014    History of Present Illness Pt s/p LVAD 10/13/14 with hospital course complicated by chest wall bleeding requiring return to OR and acute blood loss anemia. 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    PT Comments    Pt very motivated and doing well managing his LVAD equipment (needed only min cues throughout entire switch to/from batteries, including switch from neck strap to vest with waist strap and back again). Pt plans to return to his bedroom on 2nd floor and will begin working on stair training.   Follow Up Recommendations  Home health PT;Supervision/Assistance - 24 hour     Equipment Recommendations  None recommended by PT (reports he has RW with seat at home)    Recommendations for Other Services       Precautions / Restrictions Precautions Precautions: Fall;Sternal Precaution Comments: able to state and adhere to sternal precautions Required Braces or Orthoses: Other Brace/Splint Other Brace/Splint: LVAD vest/waist strap Restrictions Weight Bearing Restrictions: No Other Position/Activity Restrictions: sternal    Mobility  Bed Mobility Overal bed mobility: Needs Assistance Bed Mobility: Rolling;Sidelying to Sit;Sit to Sidelying Rolling: Supervision Sidelying to sit: Min guard;HOB elevated     Sit to sidelying: Supervision General bed mobility comments: OOB with minguard due to ICU bed with mattress inflated; supervision for safety  Transfers Overall transfer level: Needs assistance Equipment used: Rolling walker (2 wheeled);None Transfers: Sit to/from Stand Sit to Stand: Min guard;From elevated surface         General transfer comment: from elevated bed no physical assist; supervision for safety; from recliner min assist and cues for technique  Ambulation/Gait Ambulation/Gait  assistance: Min guard;Supervision Ambulation Distance (Feet): 800 Feet Assistive device: Rolling walker (2 wheeled);None Gait Pattern/deviations: WFL(Within Functional Limits)   Gait velocity interpretation: at or above normal speed for age/gender General Gait Details: steady gait and progressed to no RW; pt with brisk pace with max HR 125 and denied dizziness   Stairs            Wheelchair Mobility    Modified Rankin (Stroke Patients Only)       Balance     Sitting balance-Leahy Scale: Good       Standing balance-Leahy Scale: Fair                      Cognition Arousal/Alertness: Awake/alert Behavior During Therapy: WFL for tasks assessed/performed Overall Cognitive Status: Within Functional Limits for tasks assessed                      Exercises      General Comments General comments (skin integrity, edema, etc.): Pt required incr time to problem-solve and complete transition from console to batteries (including donning vest and waist strap) and then reversing process when returned to room and needed to get in bed for central line removal. Pt able to state all items to put in emergency bag EXCEPT clips; able to verbalize how many hours his batteries will last and demonstrated how to check level.       Pertinent Vitals/Pain Pain Assessment: No/denies pain    Home Living                      Prior Function  PT Goals (current goals can now be found in the care plan section) Acute Rehab PT Goals Patient Stated Goal: get stronger and return home Time For Goal Achievement: 10/31/14 Progress towards PT goals: Progressing toward goals    Frequency  Min 3X/week    PT Plan Current plan remains appropriate    Co-evaluation             End of Session   Activity Tolerance: Patient tolerated treatment well Patient left: in bed;with call bell/phone within reach     Time: 1103-1594 PT Time Calculation (min) (ACUTE  ONLY): 40 min  Charges:  $Gait Training: 23-37 mins $Therapeutic Activity: 8-22 mins                    G Codes:      Wolfe Camarena Oct 24, 2014, 4:16 PM Pager [phone]

## 2014-10-20 NOTE — Progress Notes (Signed)
Patient ID: Andre Tran, male   DOB: 05/24/1961, 54 y.o.   MRN: [id number] HeartMate 2 Rounding Note  Subjective:    S/P LVAD 4/4 . Taken back to OR due to chest wall bleeding, now stabilized. Has received total of 8 UPRBCs.   Extubated 4/6. Doing well. Walking hall. CVP 5-7. MAPs 85-90 (received one dose hydralazine prn overnight.     Denies SOB. CVP 5. Off milrinone co-ox 57%. Last BM on Saturday,   Hemoglobin 9.5>9.5 > 9.1 > 8.9 INR 1.4  CO-OX 55% > 61% > 64%> 57%  LVAD INTERROGATION:  HeartMate II LVAD:  Flow 4.4  liters/min, speed 9200, power 5.2, PI 7.1,  4 PI events/24 hrs  Objective:    Vital Signs:   Temp:  [98.2 F (36.8 C)-99.2 F (37.3 C)] 98.7 F (37.1 C) (04/11 0342) Pulse Rate:  [85-113] 97 (04/11 0500) Resp:  [18-35] 31 (04/11 0500) SpO2:  [90 %-100 %] 96 % (04/11 0500) Last BM Date: 10/18/14 Mean arterial Pressure 85-95  Intake/Output:   Intake/Output Summary (Last 24 hours) at 10/20/14 0611 Last data filed at 10/20/14 0500  Gross per 24 hour  Intake 1253.36 ml  Output   1620 ml  Net -366.64 ml     Physical Exam: General: NAD. Lying in bed. HEENT: normal Neck: supple. JVP 8 Carotids 2+ bilat; no bruits. No lymphadenopathy or thryomegaly appreciated. RIJ  swan  Cor: Mechanical heart sounds with LVAD hum present. Lungs: clear Abdomen: soft, nontender, nondistended. No hepatosplenomegaly. No bruits or masses. Good bowel sounds. Driveline: C/D/I; driveline dressing intact Extremities: no cyanosis, clubbing, rash, trace edema SCDs on bilaterally.  Neuro: A and oriented x3 .    Telemetry: NSR   Labs: Basic Metabolic Panel:  Recent Labs Lab 10/16/14 0450 10/16/14 2057 10/17/14 0430 10/18/14 0514 10/19/14 0430 10/20/14 0407  NA 132* 132* 132* 133* 135 134*  K 4.6 3.9 3.6 3.6 4.5 4.4  CL 101  --  97 98 100 99  CO2 25  --  26 31 28 29   GLUCOSE 139* 141* 71 92 119* 123*  BUN 10  --  14 15 16 15   CREATININE 0.93  --  0.87 0.94 0.97 0.94   CALCIUM 8.4  --  8.4 8.2* 8.2* 8.4  MG 1.9  --  2.0 2.1 2.2 2.1  PHOS 3.1  --  2.5 1.7* 1.7* 2.3    Liver Function Tests:  Recent Labs Lab 10/14/14 0245 10/14/14 1715 10/15/14 0425 10/16/14 0450  AST 105* 133* 124* 76*  ALT 36 47 53 44  ALKPHOS 36* 46 62 83  BILITOT 4.1* 4.8* 4.3* 2.4*  PROT 4.9* 4.7* 4.8* 5.4*  ALBUMIN 3.3* 3.0* 2.8* 3.0*   No results for input(s): LIPASE, AMYLASE in the last 168 hours. No results for input(s): AMMONIA in the last 168 hours.  CBC:  Recent Labs Lab 10/14/14 0245  10/15/14 0425  10/16/14 0450 10/16/14 2057 10/17/14 0430 10/18/14 0514 10/19/14 0430 10/20/14 0407  WBC 8.6  < > 13.0*  --  13.3*  --  14.0* 9.2 8.7 10.3  NEUTROABS 7.0  --  10.6*  --   --   --   --   --   --   --   HGB 8.5*  < > 9.8*  < > 9.5* 9.9* 9.4* 9.1* 8.9* 8.8*  HCT 23.7*  < > 27.9*  < > 27.0* 29.0* 27.2* 26.4* 26.3* 26.6*  MCV 82.3  < > 80.9  --  83.6  --  82.9 84.3 85.1 86.9  PLT 96*  < > 63*  --  69*  --  103* 131* 189 245  < > = values in this interval not displayed.  INR:  Recent Labs Lab 10/16/14 0450 10/17/14 0430 10/18/14 0514 10/19/14 0430 10/20/14 0407  INR 1.36 1.33 1.46 1.42 1.43    Other results:    Imaging: Dg Chest Port 1 View  10/18/2014   CLINICAL DATA:  LVAD placed on 10/13/2014  EXAM: PORTABLE CHEST - 1 VIEW  COMPARISON:  Radiograph 10/17/2014  FINDINGS: LVAD device over the left upper abdomen. Left-sided pacemaker. Right central venous line unchanged. Stable enlarged cardiac silhouette. There is improvement in mild pulmonary edema pattern seen on prior. Mild left basilar atelectasis. No pneumothorax.  IMPRESSION: 1. Improvement in mild pulmonary edema pattern seen on prior. 2. Stable support apparatus and cardiomegaly.   Electronically Signed   By: Suzy Bouchard M.D.   On: 10/18/2014 08:35     Medications:     Scheduled Medications: . amiodarone  200 mg Oral BID  . aspirin EC  81 mg Oral Daily  . atorvastatin  20 mg Oral  QHS  . digoxin  0.125 mg Oral Daily  . docusate sodium  200 mg Oral Daily  . enoxaparin (LOVENOX) injection  40 mg Subcutaneous QHS  . glipiZIDE  5 mg Oral Q supper  . guaiFENesin  600 mg Oral BID  . hydrALAZINE  25 mg Oral 3 times per day  . insulin aspart  0-24 Units Subcutaneous TID WC  . isosorbide mononitrate  30 mg Oral Daily  . magnesium oxide  400 mg Oral Q breakfast  . pantoprazole  40 mg Oral Daily  . sodium chloride  10-40 mL Intracatheter Q12H  . Warfarin - Physician Dosing Inpatient   Does not apply q1800    Infusions: . sodium chloride Stopped (10/17/14 0500)  . sodium chloride 250 mL (10/14/14 0644)    PRN Medications: sodium chloride, albuterol, hydrALAZINE, levalbuterol, morphine injection, ondansetron (ZOFRAN) IV, oxyCODONE, sodium chloride, traMADol   Assessment:  1. POD #4 S/P LVAD  2. Acute Blood Loss- Back to OR 4/5  3. Chronic Systolic Heart Failure  4.  NICM - Echo (4/2) with EF 20%, no LV thrombus, moderately dilated RV with mildly decreased systolic function, mild MR, moderate TR. chemo induced adriamycin 5.   H/O LV thrombus- 6.   Non Hodgkins Lymphoma 7.   H/O CVA 2008  8.   H/O VT/VF --St Jude ICD 2010  9.   DMII 10. SVT: Amiodarone started.   Plan/Discussion:   POD #7 S/P LVAD.   Doing well. Off milrinone. MAPs stil slightly high. In crease hydralazine to 37.5 tid. Hopefully to 2W today  Warfarin and ASA started.   Give lasix 40 po daily for now. Stop digoxin.   PI remains high. May be able to increase speed again. Will plan ramp echo tomorrow. Continue education of patient and wife.  I reviewed the LVAD parameters from today, and compared the results to the patient's prior recorded data.  No programming changes were made.  The LVAD is functioning within specified parameters.  The patient performs LVAD self-test daily.  LVAD interrogation was negative for any significant power changes, alarms or PI events/speed drops.  LVAD equipment  check completed and is in good working order.  Back-up equipment present.   LVAD education done on emergency procedures and precautions and reviewed exit site care.  Length of Stay: [place]  MD 10/20/2014, 6:11 AM  VAD Team --- VAD ISSUES ONLY--- Pager [phone] (7am - 7am)  Advanced Heart Failure Team  Pager [phone] (M-F; 7a - 4p)  Please contact [facility] for night-coverage after hours (4p -7a ) and weekends on [email or web address]

## 2014-10-21 ENCOUNTER — Telehealth: Payer: Self-pay | Admitting: *Deleted

## 2014-10-21 DIAGNOSIS — I313 Pericardial effusion (noninflammatory): Secondary | ICD-10-CM

## 2014-10-21 DIAGNOSIS — Z95811 Presence of heart assist device: Secondary | ICD-10-CM | POA: Insufficient documentation

## 2014-10-21 LAB — BASIC METABOLIC PANEL
Anion gap: 10 (ref 5–15)
BUN: 15 mg/dL (ref 6–23)
CHLORIDE: 98 mmol/L (ref 96–112)
CO2: 24 mmol/L (ref 19–32)
Calcium: 8.6 mg/dL (ref 8.4–10.5)
Creatinine, Ser: 0.94 mg/dL (ref 0.50–1.35)
GFR calc non Af Amer: >90 mL/min (ref >90)
Glucose, Bld: 108 mg/dL — ABNORMAL HIGH (ref 70–99)
POTASSIUM: 3.9 mmol/L (ref 3.5–5.1)
Sodium: 132 mmol/L — ABNORMAL LOW (ref 135–145)

## 2014-10-21 LAB — GLUCOSE, CAPILLARY
GLUCOSE-CAPILLARY: 148 mg/dL — AB (ref 70–99)
Glucose-Capillary: 103 mg/dL — ABNORMAL HIGH (ref 70–99)
Glucose-Capillary: 134 mg/dL — ABNORMAL HIGH (ref 70–99)
Glucose-Capillary: 141 mg/dL — ABNORMAL HIGH (ref 70–99)

## 2014-10-21 LAB — PROTIME-INR
INR: 1.64 — AB (ref 0.00–1.49)
PROTHROMBIN TIME: 19.6 s — AB (ref 11.6–15.2)

## 2014-10-21 LAB — LACTATE DEHYDROGENASE: LDH: 410 U/L — ABNORMAL HIGH (ref 94–250)

## 2014-10-21 MED ORDER — WARFARIN SODIUM 5 MG PO TABS
5.0000 mg | ORAL_TABLET | Freq: Once | ORAL | Status: AC
Start: 2014-10-21 — End: 2014-10-21
  Administered 2014-10-21: 5 mg via ORAL
  Filled 2014-10-21: qty 1

## 2014-10-21 MED ORDER — WARFARIN - PHARMACIST DOSING INPATIENT
Freq: Every day | Status: DC
  Administered 2014-10-21 – 2014-10-23 (×3)

## 2014-10-21 MED ORDER — LACTULOSE 10 GM/15ML PO SOLN
10.0000 g | Freq: Once | ORAL | Status: AC
  Administered 2014-10-21: 10 g via ORAL
  Filled 2014-10-21: qty 15

## 2014-10-21 MED ORDER — ENSURE ENLIVE PO LIQD
237.0000 mL | Freq: Every day | ORAL | Status: DC
  Administered 2014-10-21 – 2014-10-23 (×3): 237 mL via ORAL

## 2014-10-21 NOTE — Progress Notes (Signed)
*  PRELIMINARY RESULTS* Echocardiogram 2D Echocardiogram             Ramp study has been performed.  Andre Tran 10/21/2014, 2:22 PM

## 2014-10-21 NOTE — Progress Notes (Signed)
CARDIAC REHAB PHASE I   PRE:  Rate/Rhythm: 96 SR    BP: sitting 88    SaO2: 97 RA  MODE:  Ambulation: 900 ft   POST:  Rate/Rhythm: 121 ST    BP: sitting 94      SaO2: 98 RA  Pt doing very well. Able to independently change to tell me what to get and change over to vest and batteries. Stood independently and ambulated with supervision assist. Pt swayed at times due to previous stroke but this is his baseline. Pt feels very confident with equipment and sts he studied his books before surgery. HR to 121 ST while walking. Denied c/o. To recliner. Pt eager to walk and is asking if he can walk independently. Encouraged him to walk with RN for now. [phone]   Darrick Meigs CES, ACSM 10/21/2014 9:14 AM

## 2014-10-21 NOTE — Progress Notes (Signed)
Patient ID: Andre Tran, male   DOB: [birth year], 54 y.o.   MRN: [id number] HeartMate 2 Rounding Note  Subjective:    S/P LVAD 4/4 . Taken back to OR due to chest wall bleeding, now stabilized. Has received total of 8 UPRBCs.   Extubated 4/6. Yesterday he was transferred to 2W. Hydralazine was increased. Maps in 80s.      Denies SOB.   LDH 410  INR 1.6  LVAD INTERROGATION:  HeartMate II LVAD:  Flow 4.4  liters/min, speed 9200, power 5.2, PI 6.1,  Rare PI events/24 hrs  Objective:    Vital Signs:   Temp:  [98.1 F (36.7 C)-99 F (37.2 C)] 98.8 F (37.1 C) (04/11 1937) Pulse Rate:  [81-95] 94 (04/11 1700) Resp:  [24-36] 28 (04/11 1900) SpO2:  [90 %-100 %] 99 % (04/11 1900) Weight:  [167 lb 5.3 oz (75.9 kg)] 167 lb 5.3 oz (75.9 kg) (04/12 0553) Last BM Date: 10/18/14 Mean arterial Pressure 86  Intake/Output:   Intake/Output Summary (Last 24 hours) at 10/21/14 0811 Last data filed at 10/20/14 2150  Gross per 24 hour  Intake    530 ml  Output    875 ml  Net   -345 ml     Physical Exam: General: NAD. Sitting on the side of the bed.  HEENT: normal Neck: supple. JVP 5-6 Carotids 2+ bilat; no bruits. No lymphadenopathy or thryomegaly appreciated.  Cor: Mechanical heart sounds with LVAD hum present. Lungs: clear Abdomen: soft, nontender, nondistended. No hepatosplenomegaly. No bruits or masses. Good bowel sounds. Driveline: C/D/I; driveline dressing intact Extremities: no cyanosis, clubbing, rash, no edema   Neuro: Alert  and oriented x3 .    Telemetry: NSR   Labs: Basic Metabolic Panel:  Recent Labs Lab 10/16/14 0450  10/17/14 0430 10/18/14 0514 10/19/14 0430 10/20/14 0407 10/21/14 0352  NA 132*  < > 132* 133* 135 134* 132*  K 4.6  < > 3.6 3.6 4.5 4.4 3.9  CL 101  --  97 98 100 99 98  CO2 25  --  26 31 28 29 24   GLUCOSE 139*  < > 71 92 119* 123* 108*  BUN 10  --  14 15 16 15 15   CREATININE 0.93  --  0.87 0.94 0.97 0.94 0.94  CALCIUM 8.4  --  8.4 8.2* 8.2*  8.4 8.6  MG 1.9  --  2.0 2.1 2.2 2.1  --   PHOS 3.1  --  2.5 1.7* 1.7* 2.3  --   < > = values in this interval not displayed.  Liver Function Tests:  Recent Labs Lab 10/14/14 1715 10/15/14 0425 10/16/14 0450  AST 133* 124* 76*  ALT 47 53 44  ALKPHOS 46 62 83  BILITOT 4.8* 4.3* 2.4*  PROT 4.7* 4.8* 5.4*  ALBUMIN 3.0* 2.8* 3.0*   No results for input(s): LIPASE, AMYLASE in the last 168 hours. No results for input(s): AMMONIA in the last 168 hours.  CBC:  Recent Labs Lab 10/15/14 0425  10/16/14 0450 10/16/14 2057 10/17/14 0430 10/18/14 0514 10/19/14 0430 10/20/14 0407  WBC 13.0*  --  13.3*  --  14.0* 9.2 8.7 10.3  NEUTROABS 10.6*  --   --   --   --   --   --   --   HGB 9.8*  < > 9.5* 9.9* 9.4* 9.1* 8.9* 8.8*  HCT 27.9*  < > 27.0* 29.0* 27.2* 26.4* 26.3* 26.6*  MCV 80.9  --  83.6  --  82.9 84.3 85.1 86.9  PLT 63*  --  69*  --  103* 131* 189 245  < > = values in this interval not displayed.  INR:  Recent Labs Lab 10/17/14 0430 10/18/14 0514 10/19/14 0430 10/20/14 0407 10/21/14 0352  INR 1.33 1.46 1.42 1.43 1.64*    Other results:    Imaging: No results found.   Medications:     Scheduled Medications: . amiodarone  200 mg Oral BID  . aspirin EC  325 mg Oral Daily  . atorvastatin  20 mg Oral QHS  . docusate sodium  200 mg Oral Daily  . enoxaparin (LOVENOX) injection  40 mg Subcutaneous QHS  . furosemide  40 mg Oral Daily  . glipiZIDE  5 mg Oral Q supper  . guaiFENesin  600 mg Oral BID  . hydrALAZINE  37.5 mg Oral 3 times per day  . insulin aspart  0-24 Units Subcutaneous TID AC & HS  . isosorbide mononitrate  30 mg Oral Daily  . magnesium oxide  400 mg Oral Q breakfast  . pantoprazole  40 mg Oral Daily  . sodium chloride  3 mL Intravenous Q12H  . Warfarin - Physician Dosing Inpatient   Does not apply q1800    Infusions:    PRN Medications: sodium chloride, levalbuterol, ondansetron (ZOFRAN) IV, oxyCODONE, sodium chloride,  traMADol   Assessment:  1. POD #4 S/P LVAD  2. Acute Blood Loss- Back to OR 4/5  3. Chronic Systolic Heart Failure  4.  NICM - Echo (4/2) with EF 20%, no LV thrombus, moderately dilated RV with mildly decreased systolic function, mild MR, moderate TR. chemo induced adriamycin 5.   H/O LV thrombus- 6.   Non Hodgkins Lymphoma 7.   H/O CVA 2008  8.   H/O VT/VF --St Jude ICD 2010  9.   DMII 10. SVT: Amiodarone started.   Plan/Discussion:   POD #8 S/P LVAD.   MAPs ok. Continue  hydralazine to 37.5 tid + Imdur 30 mg daily   Warfarin and ASA started. INR 1.64  Give lasix 40 po daily for now. Stop digoxin.   Consider ramp echo today. Continue education of patient and wife.  I reviewed the LVAD parameters from today, and compared the results to the patient's prior recorded data.  No programming changes were made.  The LVAD is functioning within specified parameters.  The patient performs LVAD self-test daily.  LVAD interrogation was negative for any significant power changes, alarms or PI events/speed drops.  LVAD equipment check completed and is in good working order.  Back-up equipment present.   LVAD education done on emergency procedures and precautions and reviewed exit site care.  Length of Stay: 64  CLEGG,AMY MD 10/21/2014, 8:11 AM  VAD Team --- VAD ISSUES ONLY--- Pager [phone] (7am - 7am)  Advanced Heart Failure Team  Pager [phone] (M-F; 7a - 4p)  Please contact [facility] for night-coverage after hours (4p -7a ) and weekends on [email or web address]  Patient seen and examined with Darrick Grinder, NP. We discussed all aspects of the encounter. I agree with the assessment and plan as stated above.   Doing very well. Continue ambulation. MAPs ok. Plan for ramp echo today or tomorrow. INR 1.6 - loading coumadin. Teaching remains a bit of a challenge.   Hopefully home by weekend. VAD parameters look good.   Daniel Bensimhon,MD 1:31 PM

## 2014-10-21 NOTE — Progress Notes (Signed)
ANTICOAGULATION CONSULT NOTE - Follow Up Consult  Pharmacy Consult for Coumadin Indication: LVAD  No Known Allergies  Patient Measurements: Height: 5\' 6"  (167.6 cm) Weight: 167 lb 5.3 oz (75.9 kg) IBW/kg (Calculated) : 63.8  Labs:  Recent Labs  10/19/14 0430 10/20/14 0407 10/21/14 0352  HGB 8.9* 8.8*  --   HCT 26.3* 26.6*  --   PLT 189 245  --   LABPROT 17.5* 17.6* 19.6*  INR 1.42 1.43 1.64*  CREATININE 0.97 0.94 0.94    Estimated Creatinine Clearance: 83.9 mL/min (by C-G formula based on Cr of 0.94).  Assessment: 53yom on coumadin pta for hx LV thrombus now s/p LVAD on 4/4. Coumadin was resumed on 4/7 by MD. INR remains below goal but is trending up nicely with 5mg  doses. IV amiodarone was started on 4/6 and was changed to PO on 4/9. Pharmacy to take over coumadin dosing today.  Previous home dose was 7.5mg  daily  Goal of Therapy:  INR 2-3 Monitor platelets by anticoagulation protocol: Yes   Plan:  1) Coumadin 5mg  x 1 2) Daily INR  Deboraha Sprang 10/21/2014,10:49 AM

## 2014-10-21 NOTE — Progress Notes (Signed)
Speed  Flow  PI  Power  LVIDD  AI  Aortic openings  MR  TR  Septum  RV   9200  4.7 6.6 5.2 4.2 trace 0/0  trace  mild Slight bow to left  Mild HK  9400  4.7 6.9 5.5 3.7 mild 0/0 trace mild Bowing to left   9000  4.4 7.4 4.8 4.2 mild 0/0 trace midld midline                                           Doppler MAP: 86   Ramp ECHO performed at bedside per Dr. Haroldine Laws.  Moderate pericardial effusion noted.  Inflow cannula angulated toward septum slightly.   At completion of ramp study, no programming changes were made.  Primary and back up controller remain programmed as follows:   Fixed speed: 9200 Low speed limit: 8600

## 2014-10-21 NOTE — Telephone Encounter (Signed)
Called pt's wife and left message to confirm she will be here this afternoon for dressing change instructions per VAD coordinator. Asked that she schedule and confirm a half day (either 9 am or 1 pm) one day this week for discharge teaching for e pt, herself, and secondary caregiver with VAD coordinator. Tentative discharge Friday of this week. Stressed importance of caregiver performing dressing changes under nursing supervision prior to discharge.

## 2014-10-21 NOTE — Progress Notes (Signed)
OT Cancellation Note  Patient Details Name: Andre Tran MRN: [id number] DOB: Sep 22, 1960   Cancelled Treatment:    Reason Eval/Treat Not Completed: Pain limiting ability to participate;Fatigue/lethargy limiting ability to participate - Pt states he is in pain and fatigued after having Ramp study done.  Will reattempt.   Darlina Rumpf Nardin, OTR/L [phone]  10/21/2014, 2:30 PM

## 2014-10-21 NOTE — Progress Notes (Signed)
Admitted 10/10/14 due to pre-VAD optimization.   HeartMate II LVAD implated on 10/13/14 by Dr. Cyndia Bent for DT VAD.   Vital signs: Temp: 98.1 - 99.0 F HR:  80 - 90, SR MAP Doppler: 82 - 90 O2 Sat: 99 - 100 (RA) RR: 24 - 30 Wt in lbs:   163 > ... > 179 > 177 > 173 > 170 > 167   LVAD interrogation reveals:  Speed:  9200 Flow: 4.4 Power:  5.2 PI: 6.7 Alarms:  none  Events: rare PI Fixed speed:  9200 Low speed limit:  8600  I reviewed the LVAD parameters from today, and compared the results to the patient's prior recorded data. No programming changes were made. The LVAD is functioning within specified parameters. The patient is performing LVAD self-test daily. LVAD interrogation was negative for any significant power changes or alarms with few PI events. LVAD equipment check completed and is in good working order. Back-up equipment present.    Drive Line:  Dressing intact with attachment device in place. Daily dressing changes. Called wife and left message asking to meet with her this afternoon to practice sterile technique and observe/assist with dressing change today. Also asked her to confirm 1/2 day discharge teaching date this week with herself, pt, and secondary caregiver in prep for possible discharge Friday.    Labs:  LDH trend: 376 > 417 (peak) >  ... > 371 > 361 > 378 > 410  INR trend:  2.2 > ... > 1.46 > 1.42 > 1.43 > 1.64  WBC:  10 > 9.7 > ... > 14 > 8.2 > 8.7 > 10.3  Hgb: 9.4 > 8.2 > 7.4 > ... > 9.8 > 9.5 > 9.4  Co-Ox:  60.6 > 63.9 > 57.2 (Off milrinone)  Blood products: 1st OR 10/13/14:  3 PCs, 5 FFP, 1 plt 2nd OR 10/13/14: 5 PCs, 4 FFP, 1 plt ICU:  4/4 - 10/14/14:  2 PCs, 1 plt, 2 cryo  Gtts: Off  Nitric Oxide:  Off 10/16/14  ASA Started: 10/17/14  Warfarin Started: 10/16/14   Up in chair at bedside; eating small amounts - "gets full quickly".  Explained this is normal after VAD implant, encouraged to eat small frequent meals and emphasized eating protein in order  to promote tissue healing after surgery.  States his wife will be here after "noon"; asked that he asked her to stay for dressing change education, shared plan with nurse and asked that VAD coordinator be paged when she arrives. Dressing supplies at bedside.  Pt is performing self tests, switching power sources, and working with PT daily.   Plan/Recommendations & D/C Needs as discussed with team: 1.  Confirm discharge teaching date for pt, primary and secondary caregiver.  2.  Deliver Patient Discharge Binder as soon as possible.  Pt needs to start keeping daily log including VAD parameters, wt, temp.  3.  Encourage nutrition.   4.  Planned ramp today at 1:30, echo and Dr. Haroldine Laws aware.    5.  Continue daily dressing changes with wife's education as focus as they transition to 2W PTCU  6.  Mobility and pulmonary toilet   7.  Home equipment is on order now and will be maintained by our [employer] as routinely done prior to release home.   7. Nursing staff to assist with reinforcing sterile technique with his wife in regards to exit site care and signs/symptoms of infection. Continue discharge education during this post-operative period.

## 2014-10-21 NOTE — Progress Notes (Signed)
Patient ID: Andre Tran, male   DOB: 01-Mar-1961, 54 y.o.   MRN: [id number]  HeartMate 2 Rounding Note  Subjective:    No complaints. He says he has not had a BM since Thursday/friday but thinks that is normal for him.  No shortness of breath. Ambulating well  Weight 167. Preop 163   LVAD INTERROGATION:  HeartMate II LVAD:  Flow 4.4 liters/min, speed 9200, power 5.2, PI 6.7.    Objective:    Vital Signs:   Temp:  [98.8 F (37.1 C)] 98.8 F (37.1 C) (04/11 1937) Pulse Rate:  [94] 94 (04/11 1700) Resp:  [24-28] 28 (04/11 1900) SpO2:  [96 %-100 %] 99 % (04/11 1900) Weight:  [75.9 kg (167 lb 5.3 oz)] 75.9 kg (167 lb 5.3 oz) (04/12 0553) Last BM Date: 10/21/14 Mean arterial Pressure 86  Intake/Output:   Intake/Output Summary (Last 24 hours) at 10/21/14 1310 Last data filed at 10/21/14 0745  Gross per 24 hour  Intake    480 ml  Output    400 ml  Net     80 ml     Physical Exam: General: Well appearing. No resp difficulty HEENT: normal Neck: Carotids 2+ bilat; no bruits. No lymphadenopathy or thryomegaly appreciated. Cor: Distant heart sounds with LVAD hum present. Lungs: clear Abdomen: soft, nontender, nondistended. No hepatosplenomegaly. No bruits or masses. Good bowel sounds. Extremities: mild edema Neuro: alert & orientedx3, cranial nerves grossly intact. moves all 4 extremities w/o difficulty. Affect pleasant  Telemetry: sinus  Labs: Basic Metabolic Panel:  Recent Labs Lab 10/16/14 0450  10/17/14 0430 10/18/14 0514 10/19/14 0430 10/20/14 0407 10/21/14 0352  NA 132*  < > 132* 133* 135 134* 132*  K 4.6  < > 3.6 3.6 4.5 4.4 3.9  CL 101  --  97 98 100 99 98  CO2 25  --  26 31 28 29 24   GLUCOSE 139*  < > 71 92 119* 123* 108*  BUN 10  --  14 15 16 15 15   CREATININE 0.93  --  0.87 0.94 0.97 0.94 0.94  CALCIUM 8.4  --  8.4 8.2* 8.2* 8.4 8.6  MG 1.9  --  2.0 2.1 2.2 2.1  --   PHOS 3.1  --  2.5 1.7* 1.7* 2.3  --   < > = values in this interval not  displayed.  Liver Function Tests:  Recent Labs Lab 10/14/14 1715 10/15/14 0425 10/16/14 0450  AST 133* 124* 76*  ALT 47 53 44  ALKPHOS 46 62 83  BILITOT 4.8* 4.3* 2.4*  PROT 4.7* 4.8* 5.4*  ALBUMIN 3.0* 2.8* 3.0*   No results for input(s): LIPASE, AMYLASE in the last 168 hours. No results for input(s): AMMONIA in the last 168 hours.  CBC:  Recent Labs Lab 10/15/14 0425  10/16/14 0450 10/16/14 2057 10/17/14 0430 10/18/14 0514 10/19/14 0430 10/20/14 0407  WBC 13.0*  --  13.3*  --  14.0* 9.2 8.7 10.3  NEUTROABS 10.6*  --   --   --   --   --   --   --   HGB 9.8*  < > 9.5* 9.9* 9.4* 9.1* 8.9* 8.8*  HCT 27.9*  < > 27.0* 29.0* 27.2* 26.4* 26.3* 26.6*  MCV 80.9  --  83.6  --  82.9 84.3 85.1 86.9  PLT 63*  --  69*  --  103* 131* 189 245  < > = values in this interval not displayed.  INR:  Recent Labs Lab  10/17/14 0430 10/18/14 0514 10/19/14 0430 10/20/14 0407 10/21/14 0352  INR 1.33 1.46 1.42 1.43 1.64*    Other results:  EKG:   Imaging:  No results found.   Medications:     Scheduled Medications: . amiodarone  200 mg Oral BID  . aspirin EC  325 mg Oral Daily  . atorvastatin  20 mg Oral QHS  . docusate sodium  200 mg Oral Daily  . enoxaparin (LOVENOX) injection  40 mg Subcutaneous QHS  . furosemide  40 mg Oral Daily  . glipiZIDE  5 mg Oral Q supper  . guaiFENesin  600 mg Oral BID  . hydrALAZINE  37.5 mg Oral 3 times per day  . insulin aspart  0-24 Units Subcutaneous TID AC & HS  . isosorbide mononitrate  30 mg Oral Daily  . magnesium oxide  400 mg Oral Q breakfast  . pantoprazole  40 mg Oral Daily  . sodium chloride  3 mL Intravenous Q12H  . warfarin  5 mg Oral ONCE-1800  . Warfarin - Pharmacist Dosing Inpatient   Does not apply q1800     Infusions:     PRN Medications:  sodium chloride, levalbuterol, ondansetron (ZOFRAN) IV, oxyCODONE, sodium chloride, traMADol   Assessment:   1. POD #8 S/P LVAD and reexploration for bleeding 2.  Acute Blood Lossanemia. Hgb stable 3. Chronic Systolic Heart Failure with preop echo showing EF 20%, no LV thrombus, moderately dilated RV with mildly decreased systolic function, mild MR, moderate TR. chemo induced adriamycin. 4. H/O LV thrombus- on coumadin preop 5. Non Hodgkins Lymphoma 6. H/O CVA 2008  7. H/O VT/VF --St Jude ICD 2010  8. DMII 9. Postop thrombocytopenia: resolved  Plan/Discussion:    He remains hemodynamically stable with stable Co-ox of 57. Pump parameters ok. Plan ramp echo today.   MAP staying in the 80's now on hydralazine and Imdur.   INR increasing. Pharmacy is ordering coumadin now.   Continue teaching, mobilization  Will give a dose of lactulose.  Hopefully home later this week.   I reviewed the LVAD parameters from today, and compared the results to the patient's prior recorded data.  No programming changes were made.  The LVAD is functioning within specified parameters.  The patient performs LVAD self-test daily.  LVAD interrogation was negative for any significant power changes, alarms or PI events/speed drops.  LVAD equipment check completed and is in good working order.  Back-up equipment present.   LVAD education done on emergency procedures and precautions and reviewed exit site care.  Length of Stay: [address]  Fernande Boyden [facility] 10/21/2014, 1:10 PM

## 2014-10-21 NOTE — Progress Notes (Signed)
  Wife and daughter met with VAD coordinator to practice dressing change. Wife has observed multiple changes so she performed dressing change with me.  Daughter observed dressing change for first time.   VAD dressing removed and site care performed using sterile technique. Drive line exit site cleaned with Chlora prep applicators x 2, allowed to dry, and gauze dressing with aquacel strip re-applied. Exit site with serous drainage noted, no redness, tenderness, or foul odor noted. Two sutures remain intact. The velour is fully implanted at exit site. Drive line anchor in place and applied correctly.Driveline dressing is being changed daily per sterile technique.  Wife performed dressing change without difficulty following sterile technique. Encouragement and reassurance offered; asked her to perform daily if possible (until discharge) under nursing supervision to increase her confidence.  Wife and daughter can meet with VAD coordinator Friday am at 9 am for VAD discharge teaching.

## 2014-10-21 NOTE — Progress Notes (Signed)
NUTRITION FOLLOW UP  Intervention: Ensure Enlive po daily, each supplement provides 350 kcal and 20 grams of protein RD to follow for nutrition care plan  New Nutrition Dx: Increased nutrient needs related to LVAD as evidenced by estimated nutrition needs, ongoing  Goal: Pt to meet >/= 90% of their estimated nutrition needs, progressing  Monitor:  PO & supplemental intake, weight, labs, I/O's  ASSESSMENT: 54 year old male with severe adriamycin induced cardiomyopathy with chronic heart failure on home milrinone who is scheduled to undergo HeartMate 2 LVAD insertion for destination therapy.   Patient s/p procedure 4/4: IMPLANTATION OF HEARTMATE II LEFT VENTRICULAR ASSIST DEVICE  Patient extubated 4/6.  Diet advanced to Carbohydrate Modified 4/7.  PO intake variable at 25-75% per flowsheet records.  Would benefit from addition of oral nutrition supplements.  RD to order.  Height: Ht Readings from Last 1 Encounters:  10/12/14 5\' 6"  (1.676 m)    Weight: Wt Readings from Last 1 Encounters:  10/21/14 167 lb 5.3 oz (75.9 kg)    Body mass index is 27.02 kg/(m^2).  Estimated Nutritional Needs: Kcal: 1880 Protein: 120-140 grams Fluid: 2.5 L/day  Skin: closed chest incision, right abdominal incision  Diet Order: Diet heart healthy/carb modified Room service appropriate?: Yes; Fluid consistency:: Thin   Intake/Output Summary (Last 24 hours) at 10/21/14 1425 Last data filed at 10/21/14 1420  Gross per 24 hour  Intake    720 ml  Output    650 ml  Net     70 ml    Labs:   Recent Labs Lab 10/18/14 0514 10/19/14 0430 10/20/14 0407 10/21/14 0352  NA 133* 135 134* 132*  K 3.6 4.5 4.4 3.9  CL 98 100 99 98  CO2 31 28 29 24   BUN 15 16 15 15   CREATININE 0.94 0.97 0.94 0.94  CALCIUM 8.2* 8.2* 8.4 8.6  MG 2.1 2.2 2.1  --   PHOS 1.7* 1.7* 2.3  --   GLUCOSE 92 119* 123* 108*    CBG (last 3)   Recent Labs  10/20/14 2149 10/21/14 0618 10/21/14 1117  GLUCAP 131*  103* 148*    Scheduled Meds: . amiodarone  200 mg Oral BID  . aspirin EC  325 mg Oral Daily  . atorvastatin  20 mg Oral QHS  . docusate sodium  200 mg Oral Daily  . enoxaparin (LOVENOX) injection  40 mg Subcutaneous QHS  . furosemide  40 mg Oral Daily  . glipiZIDE  5 mg Oral Q supper  . guaiFENesin  600 mg Oral BID  . hydrALAZINE  37.5 mg Oral 3 times per day  . insulin aspart  0-24 Units Subcutaneous TID AC & HS  . isosorbide mononitrate  30 mg Oral Daily  . lactulose  10 g Oral Once  . magnesium oxide  400 mg Oral Q breakfast  . pantoprazole  40 mg Oral Daily  . sodium chloride  3 mL Intravenous Q12H  . warfarin  5 mg Oral ONCE-1800  . Warfarin - Pharmacist Dosing Inpatient   Does not apply q1800    Continuous Infusions:    Past Medical History  Diagnosis Date  . Paroxysmal ventricular tachycardia 2005, 2010    s/p AICD '05, replaced w/ St. Jude's in 2010 (PVT/V.Fib arrest requiring ICD implant in 2005)  . HYPERTENSION, UNSPECIFIED   . HYPERLIPIDEMIA-MIXED   . 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  Nonischemic cardiomyopathy     2/2 Adriamycin administration for lymphoma  . CHF (congestive heart failure)     EF 15% by echo 2009; s/p St. Jude ICD  . Diabetes mellitus     Type 2  . Cancer 1992, 2007     non hodkins lymphoma 1992, Hodgkins 2007  . Depression   . LV (left ventricular) mural thrombus 2009    2009, on coumadin  . Small bowel obstruction 2001    s/p Small bowel resection 2001  . Jejunal intussusception 2008    2008  . 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 Splenic mass 2009    Noted on Abd [place] 2009 w/ recs for f/u CT - not done  . Hypotension   . Noncompliance with medication regimen   . AICD (automatic cardioverter/defibrillator) present 2005, 2010  . Presence of permanent cardiac pacemaker     Past  Surgical History  Procedure Laterality Date  . Cardiac defibrillator placement      2005, replaced w/ St. Jude's 2010  . Vasectomy    . Bowel resection      small bowell 2/2 obstruction 2001  . Pacemaker insertion    . Insert / replace / remove pacemaker    . Colonoscopy N/A 09/18/2014    Procedure: COLONOSCOPY;  Surgeon: [name], MD;  Location: [facility] ENDOSCOPY;  Service: Endoscopy;  Laterality: N/A;  . Esophagogastroduodenoscopy N/A 09/18/2014    Procedure: ESOPHAGOGASTRODUODENOSCOPY (EGD);  Surgeon: [name], MD;  Location: [facility] ENDOSCOPY;  Service: Endoscopy;  Laterality: N/A;  . Left and right heart catheterization with coronary angiogram N/A 09/19/2014    Procedure: LEFT AND RIGHT HEART CATHETERIZATION WITH CORONARY ANGIOGRAM;  Surgeon: Jolaine Artist, MD;  Location: [facility] CATH LAB;  Service: Cardiovascular;  Laterality: N/A;  . Colonoscopy N/A 09/19/2014    Procedure: COLONOSCOPY;  Surgeon: [name], MD;  Location: [place];  Service: Gastroenterology;  Laterality: N/A;  . Insertion of implantable left ventricular assist device N/A 10/13/2014    Procedure: INSERTION OF IMPLANTABLE LEFT VENTRICULAR ASSIST DEVICE;  Surgeon: [name], MD;  Location: [place];  Service: Open Heart Surgery;  Laterality: N/A;  CIRC ARREST  NITRIC OXIDE  . Tee without cardioversion N/A 10/13/2014    Procedure: TRANSESOPHAGEAL ECHOCARDIOGRAM (TEE);  Surgeon: [name], MD;  Location: [place];  Service: Open Heart Surgery;  Laterality: N/A;  . Exploration post operative open heart N/A 10/13/2014    Procedure: EXPLORATION POST OPERATIVE OPEN HEART;  Surgeon: [name], MD;  Location: [place] OR;  Service: Open Heart Surgery;  Laterality: N/A;    Arthur Holms, RD, LDN Pager #: [phone] After-Hours Pager #: [phone]

## 2014-10-22 LAB — CBC
HCT: 26 % — ABNORMAL LOW (ref 39.0–52.0)
HEMOGLOBIN: 8.6 g/dL — AB (ref 13.0–17.0)
MCH: 29.7 pg (ref 26.0–34.0)
MCHC: 33.1 g/dL (ref 30.0–36.0)
MCV: 89.7 fL (ref 78.0–100.0)
Platelets: 314 10*3/uL (ref 150–400)
RBC: 2.9 MIL/uL — AB (ref 4.22–5.81)
RDW: 16.5 % — ABNORMAL HIGH (ref 11.5–15.5)
WBC: 10.7 10*3/uL — AB (ref 4.0–10.5)

## 2014-10-22 LAB — LACTATE DEHYDROGENASE: LDH: 459 U/L — AB (ref 94–250)

## 2014-10-22 LAB — PROTIME-INR
INR: 1.76 — ABNORMAL HIGH (ref 0.00–1.49)
Prothrombin Time: 20.7 seconds — ABNORMAL HIGH (ref 11.6–15.2)

## 2014-10-22 LAB — GLUCOSE, CAPILLARY
GLUCOSE-CAPILLARY: 127 mg/dL — AB (ref 70–99)
GLUCOSE-CAPILLARY: 197 mg/dL — AB (ref 70–99)
Glucose-Capillary: 124 mg/dL — ABNORMAL HIGH (ref 70–99)
Glucose-Capillary: 128 mg/dL — ABNORMAL HIGH (ref 70–99)

## 2014-10-22 MED ORDER — LACTULOSE 10 GM/15ML PO SOLN
10.0000 g | Freq: Once | ORAL | Status: AC
  Administered 2014-10-22: 10 g via ORAL
  Filled 2014-10-22: qty 15

## 2014-10-22 MED ORDER — FUROSEMIDE 40 MG PO TABS
40.0000 mg | ORAL_TABLET | Freq: Every day | ORAL | Status: DC
  Administered 2014-10-22 – 2014-10-23 (×2): 40 mg via ORAL
  Filled 2014-10-22 (×3): qty 1

## 2014-10-22 MED ORDER — WARFARIN SODIUM 5 MG PO TABS
5.0000 mg | ORAL_TABLET | Freq: Once | ORAL | Status: AC
  Administered 2014-10-22: 5 mg via ORAL
  Filled 2014-10-22 (×2): qty 1

## 2014-10-22 NOTE — Progress Notes (Signed)
CARDIAC REHAB PHASE I   PRE:  Rate/Rhythm: 88 SR   BP:  Sitting: 90 MAP        SaO2: 98 RA  MODE:  Ambulation: 1280 ft   POST:  Rate/Rhythm: 116 ST  BP:  Sitting: 104 MAP         SaO2: 99 RA  Pt ambulated 1280 ft on RA, hand held assist, unsteady gait at baseline, tolerated well. Pt did c/o of mild shortness of breath upon returning to his room. Pt returned to bed with call light in reach, pt changed back to power supply. Discussed phase 2 cardiac rehab, pt is agreeable. Will send referral to [place]  Lenna Sciara, RN, BSN 10/22/2014 12:05 PM

## 2014-10-22 NOTE — Progress Notes (Signed)
Admitted 10/10/14 due to pre-VAD optimization.   HeartMate II LVAD implated on 10/13/14 by Dr. Cyndia Bent for DT VAD.   Vital signs: Temp: 98.6 - 98.8 F HR:  89 - 93, SR 1st deg HB MAP Doppler: 82 - 86 O2 Sat: 98 - 99 (RA) RR: 21 Wt in lbs: 163 > ... > 179 > 177 > 173 > 170 > 167 > 166   LVAD interrogation reveals:  Speed:  9200 Flow:  Power:  PI:  Alarms:  none  Events:  Fixed speed:  9200 Low speed limit:  8600  I reviewed the LVAD parameters from today, and compared the results to the patient's prior recorded data. No programming changes were made. The LVAD is functioning within specified parameters. The patient is performing LVAD self-test daily. LVAD interrogation was negative for any significant power changes or alarms with few PI events. LVAD equipment check completed and is in good working order. Back-up equipment present.    Drive Line:  Dressing intact with attachment device in place. Daily dressing changes. Called wife and left message asking to meet with her this afternoon to practice sterile technique and observe/assist with dressing change today. Also asked her to confirm 1/2 day discharge teaching date this week with herself, pt, and secondary caregiver in prep for possible discharge Friday.    Labs:  LDH trend: 376 > 417 >  ... > 371 > 361 > 378 > 410 > 459 (peak)  INR trend:  2.2 > ... > 1.46 > 1.42 > 1.43 > 1.64 > 1.76  WBC:  10 > 9.7 > ... > 14 > 8.2 > 8.7 > 10.3 > 10.7  Hgb: 9.4 > 8.2 > 7.4 > ... > 9.8 > 9.5 > 9.4 > 8.6  Co-Ox:  60.6 > 63.9 > 57.2 (10/20/14)  Blood products: 1st OR 10/13/14:  3 PCs, 5 FFP, 1 plt 2nd OR 10/13/14: 5 PCs, 4 FFP, 1 plt ICU:  4/4 - 10/14/14:  2 PCs, 1 plt, 2 cryo  Gtts: Off  Nitric Oxide:  Off 10/16/14  ASA Started: 10/17/14  Warfarin Started: 10/16/14   Up in chair at bedside; eating small amounts - "gets full quickly".  Explained this is normal after VAD implant, encouraged to eat small frequent meals and emphasized eating  protein in order to promote tissue healing after surgery.  States his wife will be here after "noon"; asked that he asked her to stay for dressing change education, shared plan with nurse and asked that VAD coordinator be paged when she arrives. Dressing supplies at bedside.  Pt is performing self tests, switching power sources, and working with PT daily.   Plan/Recommendations & D/C Needs as discussed with team: 1.  Confirmed discharge teaching with wife and daughter Friday 10/24/14 @ 0900  2.  Will deliver D/C binder today at 1300 when we plan on meeting with his wife for a dressing change and suture removal.   3.  Encourage nutrition--wife reports that he does not usually eat too much but eats enough for his satisfaction.   4.  RAMP echo done yesterday. LVIDD on 9200 is 4.2cm showing moderate cardiac effusion with sl bowing to the left of septum. He is having several PI events on current settings of which he reports to be asymptomatic for. Consider at least PA/LAT CXR or CT for cannula placement (on echo angled towards septum slightly) for current/future comparison--as he remodels this may be something we will need to help with future speed adjustments  since his LV is on the smaller side.   5.  Continue daily dressing changes with wife's education as focus. Will perform today with her at 1300.   6.  Mobility with cardiac rehab/PT and pulmonary toilet   7.  Home equipment is on order now and will be maintained by our [employer] as routinely done prior to release home.   7. Nursing staff to assist with reinforcing sterile technique with his wife in regards to exit site care and signs/symptoms of infection. Continue discharge education during this post-operative period.

## 2014-10-22 NOTE — Progress Notes (Signed)
Occupational Therapy Treatment Patient Details Name: Andre Tran MRN: [id number] DOB: 1960/09/12 Today's Date: 10/22/2014    History of present illness Pt s/p LVAD 10/13/14 with hospital course complicated by chest wall bleeding requiring return to OR and acute blood loss anemia. 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   OT comments  Pt is making excellent progress with OT.  He is able to perform BADLs with supervision, and demonstrates good understanding of precautions.  He was able to independently switch from power supply to batteries.   Follow Up Recommendations  No OT follow up;Supervision/Assistance - 24 hour    Equipment Recommendations  None recommended by OT    Recommendations for Other Services      Precautions / Restrictions Precautions Precautions: Fall;Sternal Precaution Comments: able to state and adhere to sternal precautions Required Braces or Orthoses: Other Brace/Splint Other Brace/Splint: LVAD vest/waist strap Restrictions Weight Bearing Restrictions: No Other Position/Activity Restrictions: sternal       Mobility Bed Mobility                  Transfers Overall transfer level: Independent Equipment used: None Transfers: Sit to/from [employer] to Stand: Independent              Balance Overall balance assessment: Needs assistance Sitting-balance support: Feet supported Sitting balance-Leahy Scale: Good     Standing balance support: During functional activity Standing balance-Leahy Scale: Good                     ADL       Grooming: Wash/dry hands;Wash/dry face;Oral care;Supervision/safety;Standing   Upper Body Bathing: Set up;Sitting   Lower Body Bathing: Supervison/ safety;Sit to/from stand   Upper Body Dressing : Set up;Supervision/safety;Sitting   Lower Body Dressing: Supervision/safety;Sit to/from stand               Functional  mobility during ADLs: Supervision/safety (no device in room ) General ADL Comments: Pt is able to perform ADLs safely with supervision.  Wife present and tends to over assist.   Discussed safety with ADLs, precautions and appropriate activity level with both pt and wife. Discussed need for shower seat in tub/shower when he is cleared for showering and the rationale for that.  Wife with several questions re: traveling - instructed her to discuss with VAD coordinator      Vision                     Perception     Praxis      Cognition   Behavior During Therapy: [facility] for tasks assessed/performed Overall Cognitive Status: Within Functional Limits for tasks assessed                       Extremity/Trunk Assessment               Exercises     Shoulder Instructions       General Comments      Pertinent Vitals/ Pain       Pain Assessment: No/denies pain  Home Living                                          Prior Functioning/Environment  Frequency Min 3X/week     Progress Toward Goals  OT Goals(current goals can now be found in the care plan section)  Progress towards OT goals: Progressing toward goals  Acute Rehab OT Goals Patient Stated Goal: get stronger and return home ADL Goals Pt Will Perform Grooming: with supervision;standing Pt Will Perform Upper Body Bathing: with set-up;sitting Pt Will Perform Lower Body Bathing: with supervision;sit to/from stand Pt Will Perform Upper Body Dressing: with set-up;sitting Pt Will Perform Lower Body Dressing: with supervision;sit to/from stand Pt Will Transfer to Toilet: with supervision;ambulating;bedside commode Pt Will Perform Toileting - Clothing Manipulation and hygiene: with supervision;sit to/from stand Additional ADL Goal #1: Pt will demonstrate independence in donning and doffing holster/vest, waist strap and switching among power sources of LVAD. Additional ADL  Goal #2: Pt will generalize sternal precautions in ADL and mobility. Additional ADL Goal #3: Pt will employ energy conservation strategies in ADL and mobility independently.  Plan Discharge plan needs to be updated    Co-evaluation                 End of Session Equipment Utilized During Treatment: Other (comment) (LVAD equipment )   Activity Tolerance Patient tolerated treatment well   Patient Left in bed;with call bell/phone within reach;with nursing/sitter in room;with family/visitor present (EOB)   Nurse Communication Mobility status        Time: 6659-9357 OT Time Calculation (min): 44 min  Charges: OT General Charges $OT Visit: 1 Procedure OT Treatments $Self Care/Home Management : 38-52 mins  Muhammad Vacca M 10/22/2014, 12:08 PM

## 2014-10-22 NOTE — Progress Notes (Signed)
Physical Therapy Treatment and Discharge Patient Details Name: Andre Tran MRN: [id number] DOB: [birth year] Today's Date: 10/22/2014    History of Present Illness Pt s/p LVAD 10/13/14 with hospital course complicated by chest wall bleeding requiring return to OR and acute blood loss anemia. 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    PT Comments    Pt mobilizing well (has post-stroke gait deviations from prior CVA, however is at his baseline). Tolerated one flight of stairs with all LVAD parameters stable. Now that he has seen rollator with seat and basket underneath (which can hold his emergency equipment bag), he is interested in obtaining one (however he is unsure if insurance will cover as it may be less than 5 years since his current RW was purchased for him). Pt with no further questions for PT and agrees with plan to f/u with Phase II Cardiac Rehab when MD clears him for this.   Follow Up Recommendations  Supervision/Assistance - 24 hour;No PT follow up (plans to do Phase II Cardiac Rehab)     Equipment Recommendations  4 wheel rollator with seat (and basket under seat to hold emergency equipment bag)   Recommendations for Other Services       Precautions / Restrictions Precautions Precautions: Fall;Sternal Precaution Comments: able to state and adhere to sternal precautions Required Braces or Orthoses: Other Brace/Splint Other Brace/Splint: LVAD vest/waist strap Restrictions Weight Bearing Restrictions: No Other Position/Activity Restrictions: sternal    Mobility  Bed Mobility                  Transfers Overall transfer level: Independent Equipment used: None Transfers: Sit to/from Stand Sit to Stand: Independent            Ambulation/Gait Ambulation/Gait assistance: Supervision Ambulation Distance (Feet): 500 Feet Assistive device: None Gait Pattern/deviations: Step-through pattern;Drifts  right/left;Wide base of support   Gait velocity interpretation: at or above normal speed for age/gender General Gait Details: pt with post-stroke gait deviations (wide base, slight drift Lt/Rt, Rt foot slap as fatigues); reports at his baseline   Stairs Stairs: Yes Stairs assistance: Supervision Stair Management: One rail Left;Forwards;Alternating pattern;Step to pattern Number of Stairs: 9 General stair comments: pt initially going very quickly up stairs and cued to slow down as climbing with incr strain on heart and give it time to "catch up"; steady with no symptoms  Wheelchair Mobility    Modified Rankin (Stroke Patients Only)       Balance             Standing balance-Leahy Scale: Good                      Cognition Arousal/Alertness: Awake/alert Behavior During Therapy: WFL for tasks assessed/performed Overall Cognitive Status: Within Functional Limits for tasks assessed                      Exercises      General Comments General comments (skin integrity, edema, etc.): Lengthy discussion re: ?need for RW with seat on d/c. Pt has decided his rollator is not ideal for transporting his equipment (needs a basket under the seat; seat that flips up). Unsure if it's been more than 5 years since purchased and if insurance will cover it. Also discussed that he should not yet carry his equipment bag on his shoulder due to stress on his sternum; if he is not using rollator,  he should have family member carry bag until sternum healed (6-8 weeks)      Pertinent Vitals/Pain Pain Assessment: No/denies pain    Home Living                      Prior Function            PT Goals (current goals can now be found in the care plan section) Acute Rehab PT Goals Patient Stated Goal: get stronger and return home Time For Goal Achievement: 10/31/14 Progress towards PT goals: Goals met/education completed, patient discharged from PT    Frequency        PT Plan Discharge plan needs to be updated    Co-evaluation             End of Session   Activity Tolerance: Patient tolerated treatment well Patient left: with call bell/phone within reach;in chair;with nursing/sitter in room  Patient is being discharged from PT services secondary to:  Goals met and no further therapy needs identified.   Please see latest Therapy Progress Note above for current level of functioning and progress toward goals.  Progress and discharge plan and discussed with patient/caregiver and they  Agree        Time: 8677-3736 PT Time Calculation (min) (ACUTE ONLY): 29 min  Charges:  $Gait Training: 23-37 mins                    G Codes:      Andre Tran 11-21-2014, 8:48 AM Pager [phone]

## 2014-10-22 NOTE — Progress Notes (Addendum)
VAD Coordinator Note: Exit Site Care     Quarter sized dark drainage shadowing on dressing site observed. VAD dressing removed and site care performed using sterile technique. Drive line exit site cleaned with Chlora prep applicators x 2, allowed to dry, and gauze dressing with aquacel strip re-applied. Exit site with old bloody and serous drainage noted, no redness, tenderness, or foul odor noted. He had 3 sutures in place--the distal two were removed due to approximation of site and observed tissue over-growth. Third proximal suture remains intact. The velour is fully implanted at exit site. Drive line anchor in place and applied correctly. Driveline dressing is being changed daily per sterile technique.  Wife performed complete dressing change under my supervision correctly per sterile procedure. Encouragement and reassurance offered. She verbilized that she felt much more confident today after having completed a second dressing change. She will continue to perform daily as she is able until discharge under nursing supervision to increase her confidence.   Wife and daughter to meet with VAD coordinator Friday am at 9 am for VAD discharge teaching.

## 2014-10-22 NOTE — Progress Notes (Signed)
Patient ID: Andre Tran, male   DOB: June 06, 1961, 54 y.o.   MRN: [id number] HeartMate 2 Rounding Note  Subjective:    S/P LVAD 4/4 . Taken back to OR due to chest wall bleeding, now stabilized. Has received total of 8 UPRBCs. Extubated 4/6.    Ramp ECHO 10/20/2013  -- Continue speed at 9200.    Amubulated with cardiac rehab.   Denies SOB/pain   LDH 410 >459  INR 1.76  LVAD INTERROGATION:  HeartMate II LVAD:  Flow 4.5  liters/min, speed 9200, power 5, PI 4.6,  13 PI events.   Objective:    Vital Signs:   Temp:  [98.6 F (37 C)-98.8 F (37.1 C)] 98.8 F (37.1 C) (04/13 0415) Pulse Rate:  [89-93] 93 (04/13 0415) Resp:  [21] 21 (04/13 0415) SpO2:  [98 %-99 %] 98 % (04/13 0415) Weight:  [166 lb 10.7 oz (75.6 kg)] 166 lb 10.7 oz (75.6 kg) (04/13 0415) Last BM Date: 10/21/14 Mean arterial Pressure 82 Intake/Output:   Intake/Output Summary (Last 24 hours) at 10/22/14 0758 Last data filed at 10/21/14 1800  Gross per 24 hour  Intake    480 ml  Output    250 ml  Net    230 ml     Physical Exam: General: NAD. Sitting on the side of the bed.  HEENT: normal Neck: supple. JVP 10 Carotids 2+ bilat; no bruits. No lymphadenopathy or thryomegaly appreciated.  Cor: Mechanical heart sounds with LVAD hum present. Lungs: clear Abdomen: soft, nontender, nondistended. No hepatosplenomegaly. No bruits or masses. Good bowel sounds. Driveline: C/D/I; driveline dressing intact Extremities: no cyanosis, clubbing, rash, no edema   Neuro: Alert  and oriented x3 .    Telemetry: NSR   Labs: Basic Metabolic Panel:  Recent Labs Lab 10/16/14 0450  10/17/14 0430 10/18/14 0514 10/19/14 0430 10/20/14 0407 10/21/14 0352  NA 132*  < > 132* 133* 135 134* 132*  K 4.6  < > 3.6 3.6 4.5 4.4 3.9  CL 101  --  97 98 100 99 98  CO2 25  --  26 31 28 29 24   GLUCOSE 139*  < > 71 92 119* 123* 108*  BUN 10  --  14 15 16 15 15   CREATININE 0.93  --  0.87 0.94 0.97 0.94 0.94  CALCIUM 8.4  --  8.4 8.2*  8.2* 8.4 8.6  MG 1.9  --  2.0 2.1 2.2 2.1  --   PHOS 3.1  --  2.5 1.7* 1.7* 2.3  --   < > = values in this interval not displayed.  Liver Function Tests:  Recent Labs Lab 10/16/14 0450  AST 76*  ALT 44  ALKPHOS 83  BILITOT 2.4*  PROT 5.4*  ALBUMIN 3.0*   No results for input(s): LIPASE, AMYLASE in the last 168 hours. No results for input(s): AMMONIA in the last 168 hours.  CBC:  Recent Labs Lab 10/17/14 0430 10/18/14 0514 10/19/14 0430 10/20/14 0407 10/22/14 0320  WBC 14.0* 9.2 8.7 10.3 10.7*  HGB 9.4* 9.1* 8.9* 8.8* 8.6*  HCT 27.2* 26.4* 26.3* 26.6* 26.0*  MCV 82.9 84.3 85.1 86.9 89.7  PLT 103* 131* 189 245 314    INR:  Recent Labs Lab 10/18/14 0514 10/19/14 0430 10/20/14 0407 10/21/14 0352 10/22/14 0320  INR 1.46 1.42 1.43 1.64* 1.76*    Other results:    Imaging: No results found.   Medications:     Scheduled Medications: . amiodarone  200 mg Oral BID  .  aspirin EC  325 mg Oral Daily  . atorvastatin  20 mg Oral QHS  . docusate sodium  200 mg Oral Daily  . enoxaparin (LOVENOX) injection  40 mg Subcutaneous QHS  . feeding supplement (ENSURE ENLIVE)  237 mL Oral QPC supper  . furosemide  40 mg Oral Daily  . glipiZIDE  5 mg Oral Q supper  . guaiFENesin  600 mg Oral BID  . hydrALAZINE  37.5 mg Oral 3 times per day  . insulin aspart  0-24 Units Subcutaneous TID AC & HS  . isosorbide mononitrate  30 mg Oral Daily  . magnesium oxide  400 mg Oral Q breakfast  . pantoprazole  40 mg Oral Daily  . sodium chloride  3 mL Intravenous Q12H  . Warfarin - Pharmacist Dosing Inpatient   Does not apply q1800    Infusions:    PRN Medications: sodium chloride, levalbuterol, ondansetron (ZOFRAN) IV, oxyCODONE, sodium chloride, traMADol   Assessment:  1. POD #4 S/P LVAD  2. Acute Blood Loss- Back to OR 4/5  3. Chronic Systolic Heart Failure  4.  NICM - Echo (4/2) with EF 20%, no LV thrombus, moderately dilated RV with mildly decreased systolic  function, mild MR, moderate TR. chemo induced adriamycin 5.   H/O LV thrombus- 6.   Non Hodgkins Lymphoma 7.   H/O CVA 2008  8.   H/O VT/VF --St Jude ICD 2010  9.   DMII 10. SVT: Amiodarone started.   Plan/Discussion:   POD #9 S/P LVAD. Had ramp ECHO yesterday. Speed to remain at 9200. Increased PI events overnight. LDH trending up.   MAPs ok. Continue  hydralazine to 37.5 tid + Imdur 30 mg daily   Warfarin and ASA started. INR 1.76  Increase PI events. Stop lasix for now. Stop digoxin.   I reviewed the LVAD parameters from today, and compared the results to the patient's prior recorded data.  No programming changes were made.  The LVAD is functioning within specified parameters.  The patient performs LVAD self-test daily.  LVAD interrogation was negative for any significant power changes, alarms or PI events/speed drops.  LVAD equipment check completed and is in good working order.  Back-up equipment present.   LVAD education done on emergency procedures and precautions and reviewed exit site care.  Length of Stay: 12  CLEGG,AMY NP-C  10/22/2014, 7:58 AM  VAD Team --- VAD ISSUES ONLY--- Pager [phone] (7am - 7am)  Advanced Heart Failure Team  Pager [phone] (M-F; 7a - 4p)  Please contact [facility] for night-coverage after hours (4p -7a ) and weekends on [email or web address]  Patient seen and examined with Darrick Grinder, NP. We discussed all aspects of the encounter. I agree with the assessment and plan as stated above.   Doing very well. Ramp echo yesterday. Multiple PI events on monitor. Was considering stopping lasix but CVP still up so would continue. PI events may be related to angulation of inflow catheter toward septum as LV now much smaller.  Will follow. Continue 9200 for now. Continue to ambulate and teach. Probably home Friday.   Terris Bodin,MD 12:49 PM

## 2014-10-22 NOTE — Progress Notes (Signed)
ANTICOAGULATION CONSULT NOTE - Follow Up Consult  Pharmacy Consult for Coumadin Indication: LVAD  No Known Allergies  Patient Measurements: Height: 5\' 6"  (167.6 cm) Weight: 166 lb 10.7 oz (75.6 kg) IBW/kg (Calculated) : 63.8  Labs:  Recent Labs  10/20/14 0407 10/21/14 0352 10/22/14 0320  HGB 8.8*  --  8.6*  HCT 26.6*  --  26.0*  PLT 245  --  314  LABPROT 17.6* 19.6* 20.7*  INR 1.43 1.64* 1.76*  CREATININE 0.94 0.94  --     Estimated Creatinine Clearance: 83.9 mL/min (by C-G formula based on Cr of 0.94).  Assessment: 53yom on coumadin pta for hx LV thrombus now s/p LVAD on 4/4. Coumadin was resumed on 4/7 by MD. Pharmacy resumed dosing on 4/12. INR remains below goal but is trending up nicely with 5mg  doses. IV amiodarone was started on 4/6 and was changed to PO on 4/9. CBC stable. No bleeding reported.  Previous home dose was 7.5mg  daily  Goal of Therapy:  INR 2-3 Monitor platelets by anticoagulation protocol: Yes   Plan:  1) Repeat coumadin 5mg  x 1 2) Daily INR  Deboraha Sprang 10/22/2014,11:50 AM

## 2014-10-22 NOTE — Progress Notes (Signed)
CSW met with patient and wife at bedside. Patient spoke at length about his recovery and feeling improvement. Patient feels confident with his new LVAD and operation of the controller/batteries. Wife reports she changed the dressing yesterday and looking forward to continued to teaching. CSW spoke of the support group and asked about interest in the LVAD Buddy program. Patient and wife both interested in [facility] program and plan on attending the support group this month. CSW will continue to follow for support throughout recovery. Raquel Sarna, [place]

## 2014-10-23 ENCOUNTER — Inpatient Hospital Stay (HOSPITAL_COMMUNITY): Payer: Medicare Other

## 2014-10-23 DIAGNOSIS — Z95811 Presence of heart assist device: Secondary | ICD-10-CM

## 2014-10-23 DIAGNOSIS — I5023 Acute on chronic systolic (congestive) heart failure: Secondary | ICD-10-CM

## 2014-10-23 LAB — GLUCOSE, CAPILLARY
GLUCOSE-CAPILLARY: 108 mg/dL — AB (ref 70–99)
GLUCOSE-CAPILLARY: 158 mg/dL — AB (ref 70–99)
Glucose-Capillary: 180 mg/dL — ABNORMAL HIGH (ref 70–99)
Glucose-Capillary: 147 mg/dL — ABNORMAL HIGH (ref 70–99)

## 2014-10-23 LAB — BASIC METABOLIC PANEL
Anion gap: 12 (ref 5–15)
BUN: 15 mg/dL (ref 6–23)
CO2: 28 mmol/L (ref 19–32)
Calcium: 9.2 mg/dL (ref 8.4–10.5)
Chloride: 94 mmol/L — ABNORMAL LOW (ref 96–112)
Creatinine, Ser: 1.12 mg/dL (ref 0.50–1.35)
GFR calc Af Amer: 86 mL/min — ABNORMAL LOW (ref >90)
GFR, EST NON AFRICAN AMERICAN: 74 mL/min — AB (ref >90)
Glucose, Bld: 115 mg/dL — ABNORMAL HIGH (ref 70–99)
POTASSIUM: 3.9 mmol/L (ref 3.5–5.1)
SODIUM: 134 mmol/L — AB (ref 135–145)

## 2014-10-23 LAB — CBC
HEMATOCRIT: 30.6 % — AB (ref 39.0–52.0)
Hemoglobin: 9.9 g/dL — ABNORMAL LOW (ref 13.0–17.0)
MCH: 28.9 pg (ref 26.0–34.0)
MCHC: 32.4 g/dL (ref 30.0–36.0)
MCV: 89.5 fL (ref 78.0–100.0)
Platelets: 438 10*3/uL — ABNORMAL HIGH (ref 150–400)
RBC: 3.42 MIL/uL — AB (ref 4.22–5.81)
RDW: 16.8 % — ABNORMAL HIGH (ref 11.5–15.5)
WBC: 12.9 10*3/uL — AB (ref 4.0–10.5)

## 2014-10-23 LAB — PROTIME-INR
INR: 1.65 — ABNORMAL HIGH (ref 0.00–1.49)
PROTHROMBIN TIME: 19.6 s — AB (ref 11.6–15.2)

## 2014-10-23 LAB — LACTATE DEHYDROGENASE: LDH: 449 U/L — ABNORMAL HIGH (ref 94–250)

## 2014-10-23 MED ORDER — LACTULOSE 10 GM/15ML PO SOLN
15.0000 g | Freq: Once | ORAL | Status: AC
  Administered 2014-10-23: 15 g via ORAL
  Filled 2014-10-23: qty 30

## 2014-10-23 MED ORDER — WARFARIN SODIUM 7.5 MG PO TABS
7.5000 mg | ORAL_TABLET | Freq: Once | ORAL | Status: AC
  Administered 2014-10-23: 7.5 mg via ORAL
  Filled 2014-10-23: qty 1

## 2014-10-23 NOTE — Progress Notes (Signed)
Admitted 10/10/14 due to pre-VAD optimization.   HeartMate II LVAD implated on 10/13/14 by Dr. Cyndia Bent for DT VAD.   Vital signs: Temp: 98.2 - 98.8 F HR:   SR 1st deg HB MAP Doppler: 70 - 82 O2 Sat: 98 - 99% (RA) RR: 18 Wt in lbs: 163 > ... > 179 > 177 > 173 > 170 > 167 > 166 > 164   LVAD interrogation reveals:  Speed:  9200 Flow: 4.1 Power: 5 - 6  PI: 3.9 - 5.1 Alarms:  none  Events: 22 PI events today so far Fixed speed:  9200 Low speed limit:  8600  I reviewed the LVAD parameters from today, and compared the results to the patient's prior recorded data. No programming changes were made. The LVAD is functioning within specified parameters. The patient is performing LVAD self-test daily. LVAD interrogation was negative for any significant power changes or alarms with few PI events. LVAD equipment check completed and is in good working order. Back-up equipment present.    Drive Line:  Dressing intact with attachment device in place. Daily dressing changes. Looks good with sutures removed.   Labs:  LDH trend: 376 > 417 >  ... > 371 > 361 > 378 > 410 > 459 (peak) >449  INR trend:  2.2 > ... > 1.46 > 1.42 > 1.43 > 1.64 > 1.76 > 1.65  WBC:  10 > 9.7 > ... > 14 > 8.2 > 8.7 > 10.3 > 10.7 > 12.9  Hgb: 9.4 > 8.2 > 7.4 > ... > 9.8 > 9.5 > 9.4 > 8.6 > 9.9  Co-Ox:  60.6 > 63.9 > 57.2 (10/20/14)  Blood products: 1st OR 10/13/14:  3 PCs, 5 FFP, 1 plt 2nd OR 10/13/14: 5 PCs, 4 FFP, 1 plt ICU:  4/4 - 10/14/14:  2 PCs, 1 plt, 2 cryo  Gtts: Off  Nitric Oxide:  Off 10/16/14  ASA Started: 10/17/14  Warfarin Started: 10/16/14   He and his wife and RN are in the middle of dressing change when I came in. Reports that he is feeling well today and looking forward to going home tomorrow, as currently planned. 22 PI events today so far. Does not report dizziness; MAPs controlled, NSR on the monitor.   Pt is performing self tests, switching power sources, and working with PT  daily.   Plan/Recommendations & D/C Needs as discussed with team:  1. Confirmed discharge teaching with wife and daughter Friday 10/24/14 @ 0900. She has been doing the dressing changes daily with support from VAD Coordinator and Conservation officer, historic buildings.   2. Encourage nutrition--wife reports that he does not usually eat too much but eats enough for his satisfaction.   3. Continue daily dressing changes with wife's education as focus.    4.  Mobility with cardiac rehab/PT and pulmonary toilet.   5.  Will deliver home equipment tomorrow with session.   6. Nursing staff to assist with reinforcing sterile technique with his wife in regards to exit site care and signs/symptoms of infection. Continue discharge education during this post-operative period.

## 2014-10-23 NOTE — Progress Notes (Signed)
Patient ID: Andre Tran, male   DOB: [birth year], 54 y.o.   MRN: [id number] HeartMate 2 Rounding Note  Subjective:    S/P LVAD 4/4 . Taken back to OR due to chest wall bleeding, now stabilized. Has received total of 8 UPRBCs. Extubated 4/6.    Ramp ECHO 10/20/2013  -- Continue speed at 9200.    Amubulated with cardiac rehab.   Denies SOB/pain   LDH 410 >459 >449 INR 1.6  LVAD INTERROGATION:  HeartMate II LVAD:  Flow 4.5  liters/min, speed 9200, power 5, PI 4.6,  6 PI events.   Objective:    Vital Signs:   Temp:  [98.2 F (36.8 C)-98.4 F (36.9 C)] 98.2 F (36.8 C) (04/14 0432) Pulse Rate:  [82] 82 (04/14 0432) Resp:  [18] 18 (04/14 0432) SpO2:  [99 %] 99 % (04/14 0432) Weight:  [164 lb 3.9 oz (74.5 kg)] 164 lb 3.9 oz (74.5 kg) (04/14 0432) Last BM Date: 10/21/14 Mean arterial Pressure 82 Intake/Output:   Intake/Output Summary (Last 24 hours) at 10/23/14 0807 Last data filed at 10/23/14 0432  Gross per 24 hour  Intake    486 ml  Output   2475 ml  Net  -1989 ml     Physical Exam: General: NAD. Sitting on the side of the bed.  HEENT: normal Neck: supple. JVP 5-6 Carotids 2+ bilat; no bruits. No lymphadenopathy or thryomegaly appreciated.  Cor: Mechanical heart sounds with LVAD hum present. Lungs: clear Abdomen: soft, nontender, nondistended. No hepatosplenomegaly. No bruits or masses. Good bowel sounds. Driveline: C/D/I; driveline dressing intact Extremities: no cyanosis, clubbing, rash, no edema   Neuro: Alert  and oriented x3 .    Telemetry: NSR   Labs: Basic Metabolic Panel:  Recent Labs Lab 10/17/14 0430 10/18/14 0514 10/19/14 0430 10/20/14 0407 10/21/14 0352 10/23/14 0418  NA 132* 133* 135 134* 132* 134*  K 3.6 3.6 4.5 4.4 3.9 3.9  CL 97 98 100 99 98 94*  CO2 26 31 28 29 24 28   GLUCOSE 71 92 119* 123* 108* 115*  BUN 14 15 16 15 15 15   CREATININE 0.87 0.94 0.97 0.94 0.94 1.12  CALCIUM 8.4 8.2* 8.2* 8.4 8.6 9.2  MG 2.0 2.1 2.2 2.1  --   --    PHOS 2.5 1.7* 1.7* 2.3  --   --     Liver Function Tests: No results for input(s): AST, ALT, ALKPHOS, BILITOT, PROT, ALBUMIN in the last 168 hours. No results for input(s): LIPASE, AMYLASE in the last 168 hours. No results for input(s): AMMONIA in the last 168 hours.  CBC:  Recent Labs Lab 10/18/14 0514 10/19/14 0430 10/20/14 0407 10/22/14 0320 10/23/14 0418  WBC 9.2 8.7 10.3 10.7* 12.9*  HGB 9.1* 8.9* 8.8* 8.6* 9.9*  HCT 26.4* 26.3* 26.6* 26.0* 30.6*  MCV 84.3 85.1 86.9 89.7 89.5  PLT 131* 189 245 314 438*    INR:  Recent Labs Lab 10/19/14 0430 10/20/14 0407 10/21/14 0352 10/22/14 0320 10/23/14 0418  INR 1.42 1.43 1.64* 1.76* 1.65*    Other results:    Imaging: No results found.   Medications:     Scheduled Medications: . amiodarone  200 mg Oral BID  . aspirin EC  325 mg Oral Daily  . atorvastatin  20 mg Oral QHS  . docusate sodium  200 mg Oral Daily  . enoxaparin (LOVENOX) injection  40 mg Subcutaneous QHS  . feeding supplement (ENSURE ENLIVE)  237 mL Oral QPC supper  .  furosemide  40 mg Oral Daily  . glipiZIDE  5 mg Oral Q supper  . guaiFENesin  600 mg Oral BID  . hydrALAZINE  37.5 mg Oral 3 times per day  . insulin aspart  0-24 Units Subcutaneous TID AC & HS  . isosorbide mononitrate  30 mg Oral Daily  . magnesium oxide  400 mg Oral Q breakfast  . pantoprazole  40 mg Oral Daily  . sodium chloride  3 mL Intravenous Q12H  . Warfarin - Pharmacist Dosing Inpatient   Does not apply q1800    Infusions:    PRN Medications: sodium chloride, levalbuterol, ondansetron (ZOFRAN) IV, oxyCODONE, sodium chloride, traMADol   Assessment:  1. POD #4 S/P LVAD  2. Acute Blood Loss- Back to OR 4/5  3. Chronic Systolic Heart Failure  4.  NICM - Echo (4/2) with EF 20%, no LV thrombus, moderately dilated RV with mildly decreased systolic function, mild MR, moderate TR. chemo induced adriamycin 5.   H/O LV thrombus- 6.   Non Hodgkins Lymphoma 7.   H/O CVA  2008  8.   H/O VT/VF --St Jude ICD 2010  9.   DMII 10. SVT: Amiodarone started.   Plan/Discussion:   POD #10 S/P LVAD. Had ramp ECHO 10/21/14. Speed to remain at 9200. LDH okup.   MAPs ok. Continue  hydralazine to 37.5 tid + Imdur 30 mg daily   Warfarin and ASA started. INR 1.65  PI 7 events. Off lasix for now. Stop digoxin.   Continue LVAD educaiton..   I reviewed the LVAD parameters from today, and compared the results to the patient's prior recorded data.  No programming changes were made.  The LVAD is functioning within specified parameters.  The patient performs LVAD self-test daily.  LVAD interrogation was negative for any significant power changes, alarms or PI events/speed drops.  LVAD equipment check completed and is in good working order.  Back-up equipment present.   LVAD education done on emergency procedures and precautions and reviewed exit site care.  Length of Stay: 13  CLEGG,AMY NP-C  10/23/2014, 8:07 AM  VAD Team --- VAD ISSUES ONLY--- Pager [phone] (7am - 7am)  Advanced Heart Failure Team  Pager [phone] (M-F; 7a - 4p)  Please contact [facility] for night-coverage after hours (4p -7a ) and weekends on [email or web address]   Patient seen and examined with Darrick Grinder, NP. We discussed all aspects of the encounter. I agree with the assessment and plan as stated above.   Looks good. Volume status stable. Ambulating well. Teaching ongoing. INR 1.6. VAD parameters stable.  MAPs fine. Continue 9200. Hopefully home in am.   Benay Spice 11:09 AM

## 2014-10-23 NOTE — Progress Notes (Signed)
CSW met with patient and wife at bedside. Patient reports he hopes to go home tomorrow and feels confident that he is ready. Wife states that she did solo dressing change yesterday and will do again today. Patient appears to be coping well and will attend LVAD Support Group on Monday night. CSW will continue to be available throughout recovery. Raquel Sarna, [place]

## 2014-10-23 NOTE — Progress Notes (Signed)
Patient ID: Andre Tran, male   DOB: 02-Jul-1961, 54 y.o.   MRN: [id number] HeartMate 2 Rounding Note  Subjective:    Feels well. Was up in the solarium with his wife for several hours this afternoon and says he took a nap up there.  Passing flatus but no BM since last week.  LDH 449  Weight 164 = preop  LVAD INTERROGATION:  HeartMate II LVAD:  Flow 4.5 liters/min, speed 9200, power 5, PI 4.6.  Multiple PI events overnight  Objective:    Vital Signs:   Temp:  [98.2 F (36.8 C)-98.8 F (37.1 C)] 98.8 F (37.1 C) (04/14 1418) Pulse Rate:  [82-92] 92 (04/14 1418) Resp:  [18] 18 (04/14 0432) SpO2:  [98 %-99 %] 98 % (04/14 1418) Weight:  [74.5 kg (164 lb 3.9 oz)] 74.5 kg (164 lb 3.9 oz) (04/14 0432) Last BM Date: 10/14/14 Mean arterial Pressure 78-82  Intake/Output:   Intake/Output Summary (Last 24 hours) at 10/23/14 1734 Last data filed at 10/23/14 1606  Gross per 24 hour  Intake    483 ml  Output   2275 ml  Net  -1792 ml     Physical Exam: General: Well appearing. No resp difficulty HEENT: normal Neck: Carotids 2+ bilat; no bruits. No lymphadenopathy or thryomegaly appreciated. Cor: Distant heart sounds with LVAD hum present. Lungs: clear Abdomen: soft, nontender, nondistended. No hepatosplenomegaly. No bruits or masses. Good bowel sounds. Extremities: no edema Neuro: alert & orientedx3, cranial nerves grossly intact. moves all 4 extremities w/o difficulty. Affect pleasant   Telemetry: sinus  Labs: Basic Metabolic Panel:  Recent Labs Lab 10/17/14 0430 10/18/14 0514 10/19/14 0430 10/20/14 0407 10/21/14 0352 10/23/14 0418  NA 132* 133* 135 134* 132* 134*  K 3.6 3.6 4.5 4.4 3.9 3.9  CL 97 98 100 99 98 94*  CO2 26 31 28 29 24 28   GLUCOSE 71 92 119* 123* 108* 115*  BUN 14 15 16 15 15 15   CREATININE 0.87 0.94 0.97 0.94 0.94 1.12  CALCIUM 8.4 8.2* 8.2* 8.4 8.6 9.2  MG 2.0 2.1 2.2 2.1  --   --   PHOS 2.5 1.7* 1.7* 2.3  --   --     Liver Function  Tests: No results for input(s): AST, ALT, ALKPHOS, BILITOT, PROT, ALBUMIN in the last 168 hours. No results for input(s): LIPASE, AMYLASE in the last 168 hours. No results for input(s): AMMONIA in the last 168 hours.  CBC:  Recent Labs Lab 10/18/14 0514 10/19/14 0430 10/20/14 0407 10/22/14 0320 10/23/14 0418  WBC 9.2 8.7 10.3 10.7* 12.9*  HGB 9.1* 8.9* 8.8* 8.6* 9.9*  HCT 26.4* 26.3* 26.6* 26.0* 30.6*  MCV 84.3 85.1 86.9 89.7 89.5  PLT 131* 189 245 314 438*    INR:  Recent Labs Lab 10/19/14 0430 10/20/14 0407 10/21/14 0352 10/22/14 0320 10/23/14 0418  INR 1.42 1.43 1.64* 1.76* 1.65*    Other results:  EKG:   Imaging: Dg Chest 2 View  10/23/2014   CLINICAL DATA:  LVAD present  EXAM: CHEST  2 VIEW  COMPARISON:  10/18/2014  FINDINGS: LVAD is again identified and stable. A defibrillator is also seen as are postsurgical changes. Cardiac shadow is stable. The lungs are well aerated bilaterally. No bony abnormality is seen.  IMPRESSION: LVAD in place.  No acute abnormality is noted.   Electronically Signed   By: Inez Catalina M.D.   On: 10/23/2014 08:07      Medications:     Scheduled  Medications: . amiodarone  200 mg Oral BID  . aspirin EC  325 mg Oral Daily  . atorvastatin  20 mg Oral QHS  . docusate sodium  200 mg Oral Daily  . enoxaparin (LOVENOX) injection  40 mg Subcutaneous QHS  . feeding supplement (ENSURE ENLIVE)  237 mL Oral QPC supper  . furosemide  40 mg Oral Daily  . glipiZIDE  5 mg Oral Q supper  . guaiFENesin  600 mg Oral BID  . hydrALAZINE  37.5 mg Oral 3 times per day  . insulin aspart  0-24 Units Subcutaneous TID AC & HS  . isosorbide mononitrate  30 mg Oral Daily  . lactulose  15 g Oral Once  . magnesium oxide  400 mg Oral Q breakfast  . pantoprazole  40 mg Oral Daily  . sodium chloride  3 mL Intravenous Q12H  . warfarin  7.5 mg Oral ONCE-1800  . Warfarin - Pharmacist Dosing Inpatient   Does not apply q1800     Infusions:     PRN  Medications:  sodium chloride, levalbuterol, ondansetron (ZOFRAN) IV, oxyCODONE, sodium chloride, traMADol   Assessment:   1. POD # 9 S/P LVAD and reexploration for bleeding 2. Acute Blood Lossanemia. Hgb stable 3. Chronic Systolic Heart Failure with preop echo showing EF 20%, no LV thrombus, moderately dilated RV with mildly decreased systolic function, mild MR, moderate TR. chemo induced adriamycin. 4. H/O LV thrombus- on coumadin preop 5. Non Hodgkins Lymphoma 6. H/O CVA 2008  7. H/O VT/VF --St Jude ICD 2010  8. DMII 9. Postop thrombocytopenia: resolved  Plan/Discussion:    He continues to do well.  INR 1.76. Coumadin per pharmacy.  No BM since last week. Will give another dose of lactulose tonight.  Diuretic stopped due to PI events and weight at preop with no edema.  Possibly home tomorrow.  I reviewed the LVAD parameters from today, and compared the results to the patient's prior recorded data.  No programming changes were made.  The LVAD is functioning within specified parameters.  The patient performs LVAD self-test daily.  LVAD interrogation was negative for any significant power changes, alarms or PI events/speed drops.  LVAD equipment check completed and is in good working order.  Back-up equipment present.   LVAD education done on emergency procedures and precautions and reviewed exit site care.  Length of Stay: [address]  [name] 10/23/2014, 5:34 PM

## 2014-10-23 NOTE — Progress Notes (Signed)
Unable to assess LVAD @ 1600, pt off unit in solarium and unable to locate him, Dr. Cyndia Bent aware, will assess pt upon his return to unit. Sherrie Mustache 5:06 PM

## 2014-10-23 NOTE — Progress Notes (Signed)
ANTICOAGULATION CONSULT NOTE - Follow Up Consult  Pharmacy Consult for Coumadin Indication: LVAD  No Known Allergies  Patient Measurements: Height: 5\' 6"  (167.6 cm) Weight: 164 lb 3.9 oz (74.5 kg) IBW/kg (Calculated) : 63.8  Labs:  Recent Labs  10/21/14 0352 10/22/14 0320 10/23/14 0418  HGB  --  8.6* 9.9*  HCT  --  26.0* 30.6*  PLT  --  314 438*  LABPROT 19.6* 20.7* 19.6*  INR 1.64* 1.76* 1.65*  CREATININE 0.94  --  1.12    Estimated Creatinine Clearance: 70.4 mL/min (by C-G formula based on Cr of 1.12).  Assessment: 53yom on coumadin pta for hx LV thrombus now s/p LVAD on 4/4. Coumadin was resumed on 4/7 by MD. Pharmacy resumed dosing on 4/12. INR was trending up nicely with 5mg  doses but has decreased to 1.65 today. IV amiodarone was started on 4/6 and was changed to PO on 4/9. CBC stable. No bleeding reported.  Previous home dose was 7.5mg  daily  Goal of Therapy:  INR 2-3 Monitor platelets by anticoagulation protocol: Yes   Plan:  1) Increase coumadin to 7.5mg  x 1 2) INR in AM  Deboraha Sprang 10/23/2014,11:23 AM

## 2014-10-23 NOTE — Progress Notes (Signed)
CARDIAC REHAB PHASE I   PRE:  Rate/Rhythm: 89 SR  BP:  Supine:   Sitting: 86 map  Standing:    SaO2: 100%RA  MODE:  Ambulation: 1780 ft   POST:  Rate/Rhythm: 117 ST  BP:  Supine:   Sitting: 88 map  Standing:    SaO2: 97%RA 0950-1030 Pt walked 1780 ft on RA with steady gait after connecting to batteries. No DOE. Likes to walk. Back to power source after walk. Tolerated well. I carried back up bag.    Graylon Good, RN BSN  10/23/2014 10:35 AM

## 2014-10-23 NOTE — Progress Notes (Signed)
Medicare Important Message given? YES  (If response is "NO", the following Medicare IM given date fields will be blank)  Date Medicare IM given: 10/23/14 Medicare IM given by:  Carlous Olivares  

## 2014-10-24 ENCOUNTER — Encounter (HOSPITAL_COMMUNITY): Payer: Medicare Other

## 2014-10-24 LAB — BASIC METABOLIC PANEL
ANION GAP: 10 (ref 5–15)
BUN: 18 mg/dL (ref 6–23)
CHLORIDE: 97 mmol/L (ref 96–112)
CO2: 26 mmol/L (ref 19–32)
Calcium: 8.9 mg/dL (ref 8.4–10.5)
Creatinine, Ser: 1.01 mg/dL (ref 0.50–1.35)
GFR calc non Af Amer: 84 mL/min — ABNORMAL LOW (ref >90)
Glucose, Bld: 119 mg/dL — ABNORMAL HIGH (ref 70–99)
Potassium: 4.1 mmol/L (ref 3.5–5.1)
Sodium: 133 mmol/L — ABNORMAL LOW (ref 135–145)

## 2014-10-24 LAB — GLUCOSE, CAPILLARY
GLUCOSE-CAPILLARY: 131 mg/dL — AB (ref 70–99)
Glucose-Capillary: 148 mg/dL — ABNORMAL HIGH (ref 70–99)

## 2014-10-24 LAB — CBC
HCT: 30 % — ABNORMAL LOW (ref 39.0–52.0)
Hemoglobin: 9.8 g/dL — ABNORMAL LOW (ref 13.0–17.0)
MCH: 29.1 pg (ref 26.0–34.0)
MCHC: 32.7 g/dL (ref 30.0–36.0)
MCV: 89 fL (ref 78.0–100.0)
Platelets: 472 10*3/uL — ABNORMAL HIGH (ref 150–400)
RBC: 3.37 MIL/uL — ABNORMAL LOW (ref 4.22–5.81)
RDW: 16.5 % — ABNORMAL HIGH (ref 11.5–15.5)
WBC: 12.5 10*3/uL — AB (ref 4.0–10.5)

## 2014-10-24 LAB — PROTIME-INR
INR: 1.67 — ABNORMAL HIGH (ref 0.00–1.49)
Prothrombin Time: 19.8 seconds — ABNORMAL HIGH (ref 11.6–15.2)

## 2014-10-24 LAB — LACTATE DEHYDROGENASE: LDH: 433 U/L — ABNORMAL HIGH (ref 94–250)

## 2014-10-24 MED ORDER — HYDRALAZINE HCL 25 MG PO TABS: 37.5000 mg | ORAL_TABLET | Freq: Three times a day (TID) | ORAL | Status: DC

## 2014-10-24 MED ORDER — SORBITOL 70 % SOLN
30.0000 mL | Status: AC
  Administered 2014-10-24: 30 mL via ORAL
  Filled 2014-10-24: qty 30

## 2014-10-24 MED ORDER — FUROSEMIDE 20 MG PO TABS
20.0000 mg | ORAL_TABLET | Freq: Every day | ORAL | Status: DC
  Administered 2014-10-24: 20 mg via ORAL
  Filled 2014-10-24: qty 1

## 2014-10-24 MED ORDER — TRAMADOL HCL 50 MG PO TABS: 50.0000 mg | ORAL_TABLET | Freq: Four times a day (QID) | ORAL | Status: DC | PRN

## 2014-10-24 MED ORDER — INSULIN GLARGINE 100 UNIT/ML ~~LOC~~ SOLN: 5.0000 [IU] | Freq: Every day | SUBCUTANEOUS | Status: AC

## 2014-10-24 MED ORDER — WARFARIN SODIUM 10 MG PO TABS
10.0000 mg | ORAL_TABLET | Freq: Once | ORAL | Status: AC
  Administered 2014-10-24: 10 mg via ORAL
  Filled 2014-10-24: qty 1

## 2014-10-24 MED ORDER — PANTOPRAZOLE SODIUM 40 MG PO TBEC: 40.0000 mg | DELAYED_RELEASE_TABLET | Freq: Every day | ORAL | Status: AC

## 2014-10-24 MED ORDER — FUROSEMIDE 40 MG PO TABS: 20.0000 mg | ORAL_TABLET | ORAL | Status: DC | PRN

## 2014-10-24 MED ORDER — ASPIRIN 325 MG PO TBEC: 325.0000 mg | DELAYED_RELEASE_TABLET | Freq: Every day | ORAL | Status: DC

## 2014-10-24 MED ORDER — GLIPIZIDE 5 MG PO TABS: 5.0000 mg | ORAL_TABLET | Freq: Every evening | ORAL | Status: DC

## 2014-10-24 MED ORDER — ISOSORBIDE MONONITRATE ER 30 MG PO TB24: 30.0000 mg | ORAL_TABLET | Freq: Every day | ORAL | Status: DC

## 2014-10-24 MED ORDER — AMIODARONE HCL 200 MG PO TABS: 200.0000 mg | ORAL_TABLET | Freq: Every day | ORAL | Status: AC

## 2014-10-24 NOTE — Discharge Summary (Signed)
Advanced Heart Failure Team  Discharge Summary   Patient ID: Andre Tran MRN: [id number], DOB/AGE: [birth year] 54 y.o. Admit date: 10/10/2014 D/C date:     10/24/2014   Primary Diagnosis 1. S/P HMII LVAD for DT on 10/13/2014 2. Acute Blood Loss- Back to OR 4/5 Received total 8 UPRBCs.   3. Chronic Systolic Heart Failure  4. NICM - Echo (4/2) with EF 20%, no LV thrombus, moderately dilated RV with mildly decreased systolic function, mild MR, moderate TR. chemo induced adriamycin 5. H/O LV thrombus-on chronic coumadin.  6. Non Hodgkins Lymphoma 7. H/O CVA 2008  8. H/O VT/VF --St Jude ICD 2010  9. DMII 10. SVT: Amiodarone started. Cut back to 200 mg daily.   11. Chronic Anticoagulation- INR goal 2-3.0    Hospital Course:  [name] is a 54 y.o. 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   [name] was admitted with NYHA IV HF with home milrinone at 0.25 mcgfor planned LVAD on 10/13/14. He had HMII LVAD placed for DT on 10/13/14. Post operatively he developed bleeding and went back to the OR. He received a total of 8 UPRBCs. Otherwise his post operative course was uneventful. He had ramp ECHO on April 12th with no changes to LVAD speed however on the day of discharge his speed was turned down to 9000 due to increase PI events. He will continue on aspirin 325 mg daily and coumadin 7.5 mg daily. INR goal will be 2-3.0.   From a HF perspective he was stable. He will continue on lasix 20 mg daily, hydralazine 37.5 mg tid +imdur 30 mg daily. He and his wife received extensive education of LVAD by LVAD coordinators. His wife plans to per form dressing changes with assistance  from her daughter if needed.   He will continue to be followed closely in LVAD clinic and has follow up next Thursday. Plan to check INR, CBC, CMET, BNP at at that time. Plan for cardiac rehab in a few weeks.   LVAD INTERROGATION:  HeartMate II LVAD: Flow 4.7 liters/min, speed 9200, power 5, PI 5.4, 20 PI events--> speed turned down to 9000  Discharge Weight Range: 160 pounds Discharge Vitals: Blood pressure 91/73, pulse 92, temperature 98 F (36.7 C), temperature source Oral, resp. rate 18, height 5\' 6"  (1.676 m), weight 160 lb 12.8 oz (72.938 kg), SpO2 98 %.  Labs: Lab Results  Component Value Date   WBC 12.5* 10/24/2014   HGB 9.8* 10/24/2014   HCT 30.0* 10/24/2014   MCV 89.0 10/24/2014   PLT 472* 10/24/2014    Recent Labs Lab 10/24/14 0515  NA 133*  K 4.1  CL 97  CO2 26  BUN 18  CREATININE 1.01  CALCIUM 8.9  GLUCOSE 119*   Lab Results  Component Value Date   CHOL 106 06/21/2014   HDL 36* 06/21/2014   LDLCALC 58 06/21/2014   TRIG 61 06/21/2014   BNP (last 3 results)  Recent Labs  09/29/14 1053 10/14/14 0245 10/20/14 0407  BNP 115.3* 540.0* 535.3*    ProBNP (last 3 results)  Recent Labs  06/12/14 1415 06/18/14 1200  PROBNP 2079.0* 2144.0*     Diagnostic Studies/Procedures   Dg  Chest 2 View  10/23/2014   CLINICAL DATA:  LVAD present  EXAM: CHEST  2 VIEW  COMPARISON:  10/18/2014  FINDINGS: LVAD is again identified and stable. A defibrillator is also seen as are postsurgical changes. Cardiac shadow is stable. The lungs are well aerated bilaterally. No bony abnormality is seen.  IMPRESSION: LVAD in place.  No acute abnormality is noted.   Electronically Signed   By: Inez Catalina M.D.   On: 10/23/2014 08:07    Discharge Medications     Medication List    STOP taking these medications        digoxin 0.125 MG tablet  Commonly known as:  LANOXIN     HYDROcodone-acetaminophen 5-325 MG per tablet  Commonly known as:  NORCO/VICODIN      isosorbide dinitrate 5 MG tablet  Commonly known as:  ISORDIL     losartan 100 MG tablet  Commonly known as:  COZAAR     milrinone 20 MG/100ML Soln infusion  Commonly known as:  PRIMACOR     spironolactone 25 MG tablet  Commonly known as:  ALDACTONE      TAKE these medications        albuterol 108 (90 BASE) MCG/ACT inhaler  Commonly known as:  PROVENTIL HFA;VENTOLIN HFA  Inhale 1-2 puffs into the lungs every 6 (six) hours as needed for wheezing or shortness of breath.     amiodarone 200 MG tablet  Commonly known as:  PACERONE  Take 1 tablet (200 mg total) by mouth daily.     aspirin 325 MG EC tablet  Take 1 tablet (325 mg total) by mouth daily.     atorvastatin 40 MG tablet  Commonly known as:  LIPITOR  Take 0.5 tablets (20 mg total) by mouth at bedtime.     bisacodyl 5 MG EC tablet  Commonly known as:  DULCOLAX  Take 1 tablet (5 mg total) by mouth daily as needed for moderate constipation.     docusate sodium 100 MG capsule  Commonly known as:  COLACE  Take 1 capsule (100 mg total) by mouth 2 (two) times daily.     furosemide 40 MG tablet  Commonly known as:  LASIX  Take 0.5 tablets (20 mg total) by mouth as needed.     glipiZIDE 5 MG tablet  Commonly known as:  GLUCOTROL  Take 1 tablet (5 mg total) by mouth every evening.     hydrALAZINE 25 MG tablet  Commonly known as:  APRESOLINE  Take 1.5 tablets (37.5 mg total) by mouth every 8 (eight) hours.     insulin glargine 100 UNIT/ML injection  Commonly known as:  LANTUS  Inject 0.05 mLs (5 Units total) into the skin at bedtime.     isosorbide mononitrate 30 MG 24 hr tablet  Commonly known as:  IMDUR  Take 1 tablet (30 mg total) by mouth daily.     Magnesium 400 MG Tabs  Take 1 tablet by mouth daily with breakfast.     pantoprazole 40 MG tablet  Commonly known as:  PROTONIX  Take 1 tablet (40 mg total) by mouth daily.     traMADol 50 MG tablet  Commonly known as:  ULTRAM  Take 1-2 tablets (50-100 mg  total) by mouth every 6 (six) hours as needed for moderate pain.     warfarin 5 MG tablet  Commonly known as:  COUMADIN  Take 1 1/2 tablets daily.        Disposition   The  patient will be discharged in stable condition to home.     Discharge Instructions    Contraindication to ACEI at discharge    Complete by:  As directed      Diet - low sodium heart healthy    Complete by:  As directed      Heart Failure patients record your daily weight using the same scale at the same time of day    Complete by:  As directed      Increase activity slowly    Complete by:  As directed           Follow-up Information    Follow up with Glori Bickers, MD On 10/30/2014.   Specialty:  Cardiology   Why:  at 11:00    Contact information:   [place] [address] [phone]         Duration of Discharge Encounter: Greater than 35 minutes   Signed, CLEGG,AMY NP-C  10/24/2014, 8:55 AM Patient seen and examined with Darrick Grinder, NP. We discussed all aspects of the encounter. I agree with the assessment and plan as stated above. He and his wife have undergone extensive VAD teaching. Echo for d/c today. Benay Spice 6:29 PM

## 2014-10-24 NOTE — Progress Notes (Signed)
ANTICOAGULATION CONSULT NOTE - Follow Up Consult  Pharmacy Consult for Coumadin Indication: LVAD  No Known Allergies  Patient Measurements: Height: 5\' 6"  (167.6 cm) Weight: 160 lb 12.8 oz (72.938 kg) IBW/kg (Calculated) : 63.8  Labs:  Recent Labs  10/22/14 0320 10/23/14 0418 10/24/14 0515  HGB 8.6* 9.9* 9.8*  HCT 26.0* 30.6* 30.0*  PLT 314 438* 472*  LABPROT 20.7* 19.6* 19.8*  INR 1.76* 1.65* 1.67*  CREATININE  --  1.12 1.01    Estimated Creatinine Clearance: 78.1 mL/min (by C-G formula based on Cr of 1.01).  Assessment: 53yom on coumadin pta for hx LV thrombus now s/p LVAD on 4/4. Coumadin was resumed on 4/7 by MD. Pharmacy resumed dosing on 4/12. INR was trending up nicely with 5mg  doses but has decreased to 1.67 today. IV amiodarone was started on 4/6 and was changed to PO on 4/9. But has not seemed to affect INR yet.  Will monitor as out patient CBC stable. No bleeding reported.  Previous home dose was 7.5mg  daily  Goal of Therapy:  INR 2-3 Monitor platelets by anticoagulation protocol: Yes   Plan:  1) coumadin to 10mg  x 1 today prior to d/c Restart home dose Coumadin 7.5mg  daily - tomorrow at home 2) INR as out pt  Bonnita Nasuti Pharm.D. CPP, BCPS Clinical Pharmacist [phone] 10/24/2014 9:05 AM

## 2014-10-24 NOTE — Care Management Note (Signed)
    Page 1 of 1   10/24/2014     12:39:24 PM CARE MANAGEMENT NOTE 10/24/2014  Patient:  Andre Tran, Andre Tran   Account Number:  [id number]  Date Initiated:  10/14/2014  Documentation initiated by:  MAYO,HENRIETTA  Subjective/Objective Assessment:   S/P LVAD implant; lives with spouse, active with [facility]     Action/Plan:   Anticipated DC Date:  10/24/2014   Anticipated DC Plan:  [place]  CM consult      [facility] Choice  Resumption Of Svcs/PTA Provider   Choice offered to / List presented to:             [place].   Status of service:  Completed, signed off Medicare Important Message given?  YES (If response is "NO", the following Medicare IM given date fields will be blank) Date Medicare IM given:  10/23/2014 Medicare IM given by:  Marvetta Gibbons Date Additional Medicare IM given:   Additional Medicare IM given by:    Discharge Disposition:  HOME/SELF CARE  Per UR Regulation:  Reviewed for med. necessity/level of care/duration of stay  If discussed at [place] of Stay Meetings, dates discussed:   10/23/2014    Comments:  10/24/14- 41- Marvetta Gibbons RN, BSN [phone] Pt for d/c home today, will not need HH servies- plans to do outpt cardiac rehab  10/22/14- 1500- Marvetta Gibbons RN, BSN [phone] Pt off vent- on 2W- pt and family participating in LVAD education. Plan d/c home with [facility] - was active with [facility]  10/14/14 1205 [place] Currently on vent, epi/neo/milrinone gtts.  [facility] liaison following.

## 2014-10-24 NOTE — Progress Notes (Signed)
Pt discharged home with wife Discharge instructions given & reviewed Education discussed  IV dc'd  Tele dc'd  Pt discharged via wheelchair with RN and NT VAD equipment obtained and at side.  All patient belongs at side.   Andre Tran 3:32 PM

## 2014-10-24 NOTE — Progress Notes (Signed)
Utilization review completed.  

## 2014-10-24 NOTE — Discharge Instructions (Signed)
Information on my medicine - Coumadin   (Warfarin)  This medication education was reviewed with me or my healthcare representative as part of my discharge preparation.    Why was Coumadin prescribed for you? Coumadin was prescribed for you because you have a blood clot or a medical condition that can cause an increased risk of forming blood clots. Blood clots can cause serious health problems by blocking the flow of blood to the heart, lung, or brain. Coumadin can prevent harmful blood clots from forming. As a reminder your indication for Coumadin is:   Blood Clot Prevention After Heart Pump Surgery  What test will check on my response to Coumadin? While on Coumadin (warfarin) you will need to have an INR test regularly to ensure that your dose is keeping you in the desired range. The INR (international normalized ratio) number is calculated from the result of the laboratory test called prothrombin time (PT).  If an INR APPOINTMENT HAS NOT ALREADY BEEN MADE FOR YOU please schedule an appointment to have this lab work done by your health care provider within 7 days. Your INR goal is usually a number between:  2 to 3.  Ask your health care provider during an office visit what your goal INR is.  What  do you need to  know  About  COUMADIN? Take Coumadin (warfarin) exactly as prescribed by your healthcare provider about the same time each day.  DO NOT stop taking without talking to the doctor who prescribed the medication.  Stopping without other blood clot prevention medication to take the place of Coumadin may increase your risk of developing a new clot or stroke.  Get refills before you run out.  What do you do if you miss a dose? If you miss a dose, take it as soon as you remember on the same day then continue your regularly scheduled regimen the next day.  Do not take two doses of Coumadin at the same time.  Important Safety Information A possible side effect of Coumadin (Warfarin) is an  increased risk of bleeding. You should call your healthcare provider right away if you experience any of the following: ? Bleeding from an injury or your nose that does not stop. ? Unusual colored urine (red or dark brown) or unusual colored stools (red or black). ? Unusual bruising for unknown reasons. ? A serious fall or if you hit your head (even if there is no bleeding).  Some foods or medicines interact with Coumadin (warfarin) and might alter your response to warfarin. To help avoid this: ? Eat a balanced diet, maintaining a consistent amount of Vitamin K. ? Notify your provider about major diet changes you plan to make. ? Avoid alcohol or limit your intake to 1 drink for women and 2 drinks for men per day. (1 drink is 5 oz. wine, 12 oz. beer, or 1.5 oz. liquor.)  Make sure that ANY health care provider who prescribes medication for you knows that you are taking Coumadin (warfarin).  Also make sure the healthcare provider who is monitoring your Coumadin knows when you have started a new medication including herbals and non-prescription products.  Coumadin (Warfarin)  Major Drug Interactions  Increased Warfarin Effect Decreased Warfarin Effect  Alcohol (large quantities) Antibiotics (esp. Septra/Bactrim, Flagyl, Cipro) Amiodarone (Cordarone) Aspirin (ASA) Cimetidine (Tagamet) Megestrol (Megace) NSAIDs (ibuprofen, naproxen, etc.) Piroxicam (Feldene) Propafenone (Rythmol SR) Propranolol (Inderal) Isoniazid (INH) Posaconazole (Noxafil) Barbiturates (Phenobarbital) Carbamazepine (Tegretol) Chlordiazepoxide (Librium) Cholestyramine (Questran) Griseofulvin Oral Contraceptives Rifampin Sucralfate (  Sucralfate (Carafate) °Vitamin K  ° °Coumadin® (Warfarin) Major Herbal Interactions  °Increased Warfarin Effect Decreased Warfarin Effect  °Garlic °Ginseng °Ginkgo biloba Coenzyme Q10 °Green tea °St. John’s wort   ° °Coumadin® (Warfarin) FOOD Interactions  °Eat a consistent number of servings per week  of foods HIGH in Vitamin K °(1 serving = ½ cup)  °Collards (cooked, or boiled & drained) °Kale (cooked, or boiled & drained) °Mustard greens (cooked, or boiled & drained) °Parsley *serving size only = ¼ cup °Spinach (cooked, or boiled & drained) °Swiss chard (cooked, or boiled & drained) °Turnip greens (cooked, or boiled & drained)  °Eat a consistent number of servings per week of foods MEDIUM-HIGH in Vitamin K °(1 serving = 1 cup)  °Asparagus (cooked, or boiled & drained) °Broccoli (cooked, boiled & drained, or raw & chopped) °Brussel sprouts (cooked, or boiled & drained) *serving size only = ½ cup °Lettuce, raw (green leaf, endive, romaine) °Spinach, raw °Turnip greens, raw & chopped  ° °These websites have more information on Coumadin (warfarin):  www.coumadin.com; °www.ahrq.gov/consumer/coumadin.htm; ° ° ° °

## 2014-10-24 NOTE — Anesthesia Postprocedure Evaluation (Signed)
  Anesthesia Post-op Note  Patient: Andre Tran  Procedure(s) Performed: Procedure(s) with comments: INSERTION OF IMPLANTABLE LEFT VENTRICULAR ASSIST DEVICE (N/A) - CIRC ARREST  NITRIC OXIDE TRANSESOPHAGEAL ECHOCARDIOGRAM (TEE) (N/A)  Patient Location: SICU  Anesthesia Type:General  Level of Consciousness: sedated and Patient remains intubated per anesthesia plan  Airway and Oxygen Therapy: Patient remains intubated per anesthesia plan and Patient placed on Ventilator (see vital sign flow sheet for setting)  Post-op Pain: none  Post-op Assessment: Post-op Vital signs reviewed, Patient's Cardiovascular Status Stable, Respiratory Function Stable, Patent Airway and Pain level controlled  Post-op Vital Signs: stable  Last Vitals:  Filed Vitals:   10/24/14 1414  BP:   Pulse:   Temp: 36.7 C  Resp:     Complications: No apparent anesthesia complications

## 2014-10-24 NOTE — Progress Notes (Signed)
[id number] Cardiac Rehab Completed discharge education with pt, wife and daughter. He voices understanding. Reviewed CHF zones, low sodium diet, daily weights, when to call MD and 911. Discussed sternal precautions and use of IS at discharge. Reinforced that pt take back up bag with him everywhereHughes, Valinda Hoar, RN 10/24/2014 10:43 AM

## 2014-10-24 NOTE — Progress Notes (Signed)
Patient ID: Andre Tran, male   DOB: [birth year], 54 y.o.   MRN: [id number] HeartMate 2 Rounding Note  Subjective:    S/P LVAD 4/4 . Taken back to OR due to chest wall bleeding, now stabilized. Has received total of 8 UPRBCs. Extubated 4/6.    Ramp ECHO 10/20/2013  -- speed at 9200.   Denies SOB. Ambulating. Eager to go home.   LDH 410 >459 >449>433 INR 1.67  LVAD INTERROGATION:  HeartMate II LVAD:  Flow 4.7  liters/min, speed 9200, power 5, PI 5.4, 20  PI events.   Objective:    Vital Signs:   Temp:  [98 F (36.7 C)-98.8 F (37.1 C)] 98 F (36.7 C) (04/15 0530) Pulse Rate:  [92] 92 (04/14 1418) SpO2:  [98 %] 98 % (04/14 1418) Weight:  [160 lb 12.8 oz (72.938 kg)] 160 lb 12.8 oz (72.938 kg) (04/15 0530) Last BM Date: 10/14/14 Mean arterial Pressure 76 Intake/Output:   Intake/Output Summary (Last 24 hours) at 10/24/14 0825 Last data filed at 10/24/14 0400  Gross per 24 hour  Intake    243 ml  Output   1375 ml  Net  -1132 ml     Physical Exam: General: NAD. Sitting on the side of the bed.  HEENT: normal Neck: supple. JVP 7 Carotids 2+ bilat; no bruits. No lymphadenopathy or thryomegaly appreciated.  Cor: Mechanical heart sounds with LVAD hum present. Lungs: clear Abdomen: soft, nontender, nondistended. No hepatosplenomegaly. No bruits or masses. Good bowel sounds. Driveline: C/D/I; driveline dressing intact Extremities: no cyanosis, clubbing, rash, no edema   Neuro: Alert  and oriented x3 .    Telemetry: NSR   Labs: Basic Metabolic Panel:  Recent Labs Lab 10/18/14 0514 10/19/14 0430 10/20/14 0407 10/21/14 0352 10/23/14 0418 10/24/14 0515  NA 133* 135 134* 132* 134* 133*  K 3.6 4.5 4.4 3.9 3.9 4.1  CL 98 100 99 98 94* 97  CO2 31 28 29 24 28 26   GLUCOSE 92 119* 123* 108* 115* 119*  BUN 15 16 15 15 15 18   CREATININE 0.94 0.97 0.94 0.94 1.12 1.01  CALCIUM 8.2* 8.2* 8.4 8.6 9.2 8.9  MG 2.1 2.2 2.1  --   --   --   PHOS 1.7* 1.7* 2.3  --   --   --      Liver Function Tests: No results for input(s): AST, ALT, ALKPHOS, BILITOT, PROT, ALBUMIN in the last 168 hours. No results for input(s): LIPASE, AMYLASE in the last 168 hours. No results for input(s): AMMONIA in the last 168 hours.  CBC:  Recent Labs Lab 10/19/14 0430 10/20/14 0407 10/22/14 0320 10/23/14 0418 10/24/14 0515  WBC 8.7 10.3 10.7* 12.9* 12.5*  HGB 8.9* 8.8* 8.6* 9.9* 9.8*  HCT 26.3* 26.6* 26.0* 30.6* 30.0*  MCV 85.1 86.9 89.7 89.5 89.0  PLT 189 245 314 438* 472*    INR:  Recent Labs Lab 10/20/14 0407 10/21/14 0352 10/22/14 0320 10/23/14 0418 10/24/14 0515  INR 1.43 1.64* 1.76* 1.65* 1.67*    Other results:    Imaging: Dg Chest 2 View  10/23/2014   CLINICAL DATA:  LVAD present  EXAM: CHEST  2 VIEW  COMPARISON:  10/18/2014  FINDINGS: LVAD is again identified and stable. A defibrillator is also seen as are postsurgical changes. Cardiac shadow is stable. The lungs are well aerated bilaterally. No bony abnormality is seen.  IMPRESSION: LVAD in place.  No acute abnormality is noted.   Electronically Signed   By: Elta Guadeloupe  Lukens M.D.   On: 10/23/2014 08:07     Medications:     Scheduled Medications: . amiodarone  200 mg Oral BID  . aspirin EC  325 mg Oral Daily  . atorvastatin  20 mg Oral QHS  . docusate sodium  200 mg Oral Daily  . enoxaparin (LOVENOX) injection  40 mg Subcutaneous QHS  . feeding supplement (ENSURE ENLIVE)  237 mL Oral QPC supper  . furosemide  40 mg Oral Daily  . glipiZIDE  5 mg Oral Q supper  . guaiFENesin  600 mg Oral BID  . hydrALAZINE  37.5 mg Oral 3 times per day  . insulin aspart  0-24 Units Subcutaneous TID AC & HS  . isosorbide mononitrate  30 mg Oral Daily  . magnesium oxide  400 mg Oral Q breakfast  . pantoprazole  40 mg Oral Daily  . sodium chloride  3 mL Intravenous Q12H  . Warfarin - Pharmacist Dosing Inpatient   Does not apply q1800    Infusions:    PRN Medications: sodium chloride, levalbuterol, ondansetron  (ZOFRAN) IV, oxyCODONE, sodium chloride, traMADol   Assessment:  1. POD #4 S/P LVAD  2. Acute Blood Loss- Back to OR 4/5  3. Chronic Systolic Heart Failure  4.  NICM - Echo (4/2) with EF 20%, no LV thrombus, moderately dilated RV with mildly decreased systolic function, mild MR, moderate TR. chemo induced adriamycin 5.   H/O LV thrombus- 6.   Non Hodgkins Lymphoma 7.   H/O CVA 2008  8.   H/O VT/VF --St Jude ICD 2010  9.   DMII 10. SVT: Amiodarone started.   Plan/Discussion:   POD #11 S/P LVAD. Had ramp ECHO 10/21/14. Speed to remain at 9200. Today having increased PI events.    LDH 449>433.   MAPs ok. Continue  hydralazine to 37.5 tid + Imdur 30 mg daily   Warfarin and ASA started. INR 1.67  PI 20 events. Cut LVAD speed down to 9000. Volume status low. Cut lasix back to 20 mg daily.   Continue LVAD educaiton..   I reviewed the LVAD parameters from today, and compared the results to the patient's prior recorded data.  No programming changes were made.  The LVAD is functioning within specified parameters.  The patient performs LVAD self-test daily.  LVAD interrogation was negative for any significant power changes, alarms or PI events/speed drops.  LVAD equipment check completed and is in good working order.  Back-up equipment present.   LVAD education done on emergency procedures and precautions and reviewed exit site care.  Length of Stay: 14  CLEGG,AMY NP-C  10/24/2014, 8:25 AM  VAD Team --- VAD ISSUES ONLY--- Pager [phone] (7am - 7am)  Advanced Heart Failure Team  Pager [phone] (M-F; 7a - 4p)  Please contact [facility] for night-coverage after hours (4p -7a ) and weekends on [email or web address]  Patient seen and examined with Darrick Grinder, NP. We discussed all aspects of the encounter. I agree with the assessment and plan as stated above.   Looks great. [place] for d/c today. Will cut VAD speed down to 9000 due to PI events. Drop lasix to 20 daily.  Coumadin dosing per pharmacy.  See in [facility] next week.   Kourtlynn Trevor,MD 9:14 AM

## 2014-10-24 NOTE — Progress Notes (Signed)
Pt ambulated 650 ft with RN. Pt denied SOB or dizziness; pt tolerated well. Pt now resting in room and switched back to [place] unit. Will cont to monitor.

## 2014-10-24 NOTE — Progress Notes (Signed)
Spoke with Dr. Haroldine Laws about concerns of patient not having a bm since 10/14/14 and being discharged, MD would like to give him a second dose of sorbitol and then he can be discharge. Kathleen Argue S 2:00 PM

## 2014-10-24 NOTE — Progress Notes (Addendum)
VAD Teaching Note:   Patient along with [name]  (primary caregiver) and Charlena Cross (daughter) have received VAD teaching and education and verbalize the understanding of the HM II system operation. Rob Hickman and Ebony have both taken the education verification test and passed.   Discharge date is set for 10/24/14.   The patient's wife and daughter have been trained on the driveline management sterile dressing change and have performed the dressing under my supervision. I have reviewed the daily flow sheet that the patient will record his VAD parameters along with his weights and driveline dressing status with patient and family.   Patient has had extensive education about:  1) Who to call for a VAD emergency.  2) What is considered a VAD emergency.  3) What back-up equipment needs to be with him at  all times.  4) How to take care of his driveline and equipment.  5) How to change his system controller.  6) The restrictions he has with having the pump.  7) How to change power sources.  8) What the critical and advisory alarms are for her VAD.  system.  9) The importance of Coumadin and following up for clinic appointments.  He reports he feels comfortable with his VAD system.   Discharge information materials given to patient include the VAD Booklet.  All questions have been answered--patient, his wife and daughter have performed a system controller exchange with the patient's back up system controller and my mock controller.  Stressed importance of never disconnecting power from both controller power leads at the same time, and never disconnecting both batteries at the same time, or the pump will stop.   Stressed importance of performing home inspection and assuring that only three pronged grounded power outlets can be used for VAD equipment. Husband confirmed home has electrical outlets that will support VAD equipment when patient discharged home.   Identified the following changes in  activities of daily living with pump:  1. No driving for at least three months and then only if doctor gives permission to do so.  2. No tub baths while pump implanted, and shower only if doctor gives permission.  3. No swimming or submersion in water while implanted with pump.  4. No contact sports or engaging in jumping activities.  5. Always have a backup controller, charged spare batteries, and battery clips nearby at all times in case of emergency.  6. Call the doctor or hospital contact person if any change in how the pump sounds, feels, or works.  7. Plan to sleep only when connected to the power module.  8. Do not sleep on your stomach.  9. Keep a backup system controller, charged batteries, battery clips, and flashlight near you during sleep in case of electrical power outage.  10. Exit site care including dressing changes, monitoring for infection, and importance of keeping percutaneous lead stabilized at all times.   Also discussed need for 24 hour/7 day week caregivers; pt designated several family members including husband and mother. Stressed importance of designating 2 - 3 people to perform dressing changes to keep consistency with wound care.  Offered to have VAD pt either call or come by and meet with pt and family; pt "does not need anyone" at this time.  Reviewed pictures of VAD drive line, site care, dressing changes, and drive line stabilization including securement attachment device and abdominal binder. Discussed with pt and family that they will be required to purchase dressing supplies as long  as patient has the VAD in place.   Explained bridge to recovery versus bridge to transplant; pt and family expressed understanding of both and all voiced agreement to proceed with VAD implant acknowledging that bridge to recovery could become bridge to transplant. Pt, husband, and mother asked appropriate questions, had good interaction with VAD coordinator, and verbalized understanding  of above.   Reinforced need for 24 hour/7 day week caregivers while he is at home recovering for the next few weeks. First weekly clinic set up for Thursday 10/30/14 @ 1100. Discussed expected visits, frequent labs, INTERMACs updating. Reinforced sternal precautions with no lifting >10lbs, pushing, or pulling. Stressed the importance of having only his trained caregivers performing his dressing changes for consistency in technique and ability to recognize changes to the site or infection that she would need to alert the team about.   All home equipment has been delivered to his room and was packed up for them to take home. Staff to assist with dressing change one more time today prior to discharge this afternoon/early evening.   Total Education Time: 4 hours    Saia Derossett, Gareth Morgan, RN

## 2014-10-26 ENCOUNTER — Telehealth (HOSPITAL_COMMUNITY): Payer: Self-pay | Admitting: Infectious Diseases

## 2014-10-26 NOTE — Telephone Encounter (Signed)
Paged by [name] re: questions about medications upon discharge. She claims that Casten took Digoxin, Spironolactone, and Losartan while he was inpatient and sees that they have been discontinued. I told her I would call her back once I reviewed his discharge summary and his inpatient MAR reports. He did not receive any of the medications post-op and they were listed on the "Stop Taking" portion of the summary.   I attempted to call her back at the same number from which she paged to inform her of this and that he should not be taking them, however she did not answer. I did leave a message stating the above and to page back if further questions.

## 2014-10-30 ENCOUNTER — Other Ambulatory Visit (HOSPITAL_COMMUNITY): Payer: Self-pay | Admitting: Infectious Diseases

## 2014-10-30 ENCOUNTER — Ambulatory Visit (HOSPITAL_COMMUNITY)
Admit: 2014-10-30 | Discharge: 2014-10-30 | Disposition: A | Discharge status: Home / Self Care | Payer: Medicare Other | Source: Ambulatory Visit | Attending: Internal Medicine | Admitting: Internal Medicine

## 2014-10-30 ENCOUNTER — Encounter (HOSPITAL_COMMUNITY): Payer: Self-pay

## 2014-10-30 ENCOUNTER — Ambulatory Visit (HOSPITAL_COMMUNITY): Payer: Self-pay | Admitting: Infectious Diseases

## 2014-10-30 VITALS — BP 92/0 | HR 94 | Wt 162.4 lb

## 2014-10-30 DIAGNOSIS — Z5181 Encounter for therapeutic drug level monitoring: Secondary | ICD-10-CM | POA: Diagnosis present

## 2014-10-30 DIAGNOSIS — Z7901 Long term (current) use of anticoagulants: Secondary | ICD-10-CM

## 2014-10-30 DIAGNOSIS — I429 Cardiomyopathy, unspecified: Secondary | ICD-10-CM | POA: Diagnosis not present

## 2014-10-30 DIAGNOSIS — C8590 Non-Hodgkin lymphoma, unspecified, unspecified site: Secondary | ICD-10-CM | POA: Insufficient documentation

## 2014-10-30 DIAGNOSIS — E782 Mixed hyperlipidemia: Secondary | ICD-10-CM | POA: Insufficient documentation

## 2014-10-30 DIAGNOSIS — E119 Type 2 diabetes mellitus without complications: Secondary | ICD-10-CM | POA: Insufficient documentation

## 2014-10-30 DIAGNOSIS — I5022 Chronic systolic (congestive) heart failure: Secondary | ICD-10-CM | POA: Diagnosis not present

## 2014-10-30 DIAGNOSIS — Z8673 Personal history of transient ischemic attack (TIA), and cerebral infarction without residual deficits: Secondary | ICD-10-CM | POA: Diagnosis not present

## 2014-10-30 DIAGNOSIS — Z95811 Presence of heart assist device: Secondary | ICD-10-CM

## 2014-10-30 DIAGNOSIS — Z79899 Other long term (current) drug therapy: Secondary | ICD-10-CM | POA: Insufficient documentation

## 2014-10-30 DIAGNOSIS — I10 Essential (primary) hypertension: Secondary | ICD-10-CM | POA: Diagnosis not present

## 2014-10-30 LAB — BASIC METABOLIC PANEL
ANION GAP: 10 (ref 5–15)
BUN: 17 mg/dL (ref 6–23)
CHLORIDE: 101 mmol/L (ref 96–112)
CO2: 24 mmol/L (ref 19–32)
CREATININE: 1.36 mg/dL — AB (ref 0.50–1.35)
Calcium: 9.4 mg/dL (ref 8.4–10.5)
GFR calc Af Amer: 68 mL/min — ABNORMAL LOW (ref >90)
GFR calc non Af Amer: 59 mL/min — ABNORMAL LOW (ref >90)
GLUCOSE: 120 mg/dL — AB (ref 70–99)
POTASSIUM: 4.4 mmol/L (ref 3.5–5.1)
Sodium: 135 mmol/L (ref 135–145)

## 2014-10-30 LAB — CBC
HCT: 33.8 % — ABNORMAL LOW (ref 39.0–52.0)
HEMOGLOBIN: 10.9 g/dL — AB (ref 13.0–17.0)
MCH: 29 pg (ref 26.0–34.0)
MCHC: 32.2 g/dL (ref 30.0–36.0)
MCV: 89.9 fL (ref 78.0–100.0)
Platelets: 502 10*3/uL — ABNORMAL HIGH (ref 150–400)
RBC: 3.76 MIL/uL — AB (ref 4.22–5.81)
RDW: 15.7 % — ABNORMAL HIGH (ref 11.5–15.5)
WBC: 9 10*3/uL (ref 4.0–10.5)

## 2014-10-30 LAB — LACTATE DEHYDROGENASE: LDH: 370 U/L — ABNORMAL HIGH (ref 94–250)

## 2014-10-30 LAB — PROTIME-INR
INR: 2.08 — ABNORMAL HIGH (ref 0.00–1.49)
Prothrombin Time: 23.6 seconds — ABNORMAL HIGH (ref 11.6–15.2)

## 2014-10-30 MED ORDER — MAGNESIUM 400 MG PO TABS: 1.0000 | ORAL_TABLET | Freq: Every day | ORAL | Status: AC

## 2014-10-30 MED ORDER — WARFARIN SODIUM 5 MG PO TABS: ORAL_TABLET | ORAL | Status: DC

## 2014-10-30 MED ORDER — AMOXICILLIN 500 MG PO CAPS: 2000.0000 mg | ORAL_CAPSULE | ORAL | Status: DC | PRN

## 2014-10-30 MED ORDER — SPIRONOLACTONE 25 MG PO TABS: 25.0000 mg | ORAL_TABLET | Freq: Every day | ORAL | Status: DC

## 2014-10-30 NOTE — Progress Notes (Signed)
Patient ID: ZYERE JIMINEZ, male   DOB: 11-Dec-1960, 54 y.o.   MRN: [id number] PCP:   HPI: Mr Matuszak is a 54 y.o. 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 Underwent HMII LVAD placement 10/13/2014   Admitted April 1st 2016 for schedule HMII implant. Post implant he was taken back to the OR for bleeding otherwise his post operative course was uneventful. LVAD speed turned down to 9000 on the day of discharge. Discharge weight was 160 pounds.   Follow up for Heart Failure/LVAD: Overall feeling good. Denies SOB/PND/Orthopnea. Denies dizziness. Wife changing dressing daily. Following low salt diet.  Appetite ok. Had 2 black stools since discharge.  Weight at home 156-159.  Has not needed lasix. Taking all medications. Walking at least 8 minutes at a time. [facility] following.   Denies LVAD alarms.  Denies driveline trauma, erythema or drainage.  Denies ICD shocks.   Reports taking Coumadin as prescribed and adherence to anticoagulation based dietary restrictions.  Denies bright red blood per rectum or melena, no dark urine or hematuria.     Past Medical History  Diagnosis Date  . Paroxysmal ventricular tachycardia 2005, 2010    s/p AICD '05, replaced w/ St. Jude's in 2010 (PVT/V.Fib arrest requiring ICD implant in 2005)  . HYPERTENSION, UNSPECIFIED   . HYPERLIPIDEMIA-MIXED   . 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 Nonischemic cardiomyopathy     2/2 Adriamycin administration for lymphoma  . CHF (congestive heart failure)     EF 15% by echo 2009; s/p St. Jude ICD  . Diabetes  mellitus     Type 2  . Cancer 1992, 2007     non hodkins lymphoma 1992, Hodgkins 2007  . Depression   . LV (left ventricular) mural thrombus 2009    2009, on coumadin  . Small bowel obstruction 2001    s/p Small bowel resection 2001  . Jejunal intussusception 2008    2008  . 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 Splenic mass 2009    Noted on Abd [place] 2009 w/ recs for f/u CT - not done  . Hypotension   . Noncompliance with medication regimen   . AICD (automatic cardioverter/defibrillator) present 2005, 2010  . Presence of permanent cardiac pacemaker     Current Outpatient Prescriptions  Medication Sig Dispense Refill  . albuterol (PROVENTIL HFA;VENTOLIN HFA) 108 (90 BASE) MCG/ACT inhaler Inhale 1-2 puffs into the lungs every 6 (six) hours as needed for wheezing or shortness of breath.    Marland Kitchen amiodarone (PACERONE) 200 MG tablet Take 1 tablet (200 mg total) by mouth daily. 30 tablet 6  . aspirin EC 325 MG EC tablet Take 1 tablet (325 mg total) by mouth daily. 30 tablet 6  . atorvastatin (LIPITOR) 40 MG tablet Take 0.5 tablets (20 mg total) by mouth at bedtime. 30 tablet 6  . bisacodyl (DULCOLAX) 5 MG EC tablet Take 1 tablet (5 mg total) by mouth daily as needed for moderate constipation. 30 tablet 0  . docusate sodium (COLACE) 100 MG capsule Take 1  capsule (100 mg total) by mouth 2 (two) times daily. 10 capsule 0  . furosemide (LASIX) 40 MG tablet Take 0.5 tablets (20 mg total) by mouth as needed. 30 tablet   . glipiZIDE (GLUCOTROL) 5 MG tablet Take 1 tablet (5 mg total) by mouth every evening. 30 tablet 6  . hydrALAZINE (APRESOLINE) 25 MG tablet Take 1.5 tablets (37.5 mg total) by mouth every 8 (eight) hours. 90 tablet 6  . insulin glargine (LANTUS) 100 UNIT/ML injection Inject 0.05 mLs (5 Units total) into the skin at bedtime. 10 mL 11  . isosorbide mononitrate (IMDUR) 30 MG 24 hr tablet Take 1 tablet (30 mg total) by mouth daily. 30 tablet 6  .  Magnesium 400 MG TABS Take 1 tablet by mouth daily with breakfast. 90 tablet 3  . pantoprazole (PROTONIX) 40 MG tablet Take 1 tablet (40 mg total) by mouth daily. 30 tablet 6  . traMADol (ULTRAM) 50 MG tablet Take 1-2 tablets (50-100 mg total) by mouth every 6 (six) hours as needed for moderate pain. 30 tablet 0  . warfarin (COUMADIN) 5 MG tablet Take 1 1/2 tablets daily. 45 tablet 3   No current facility-administered medications for this encounter.    Review of patient's allergies indicates no known allergies.  REVIEW OF SYSTEMS: All systems negative except as listed in HPI, PMH and Problem list.   LVAD INTERROGATION:   HeartMate II LVAD:   Speed:  9000 Flow: 3.8  Power:  4.7  Has had 3 power spikes over 10 PI: 6.2 Alarms: No alarms  Events: 10-20 PI  Fixed speed: 9000 Low speed limit: 8400 Primary Controller:  Replace back up battery in  _____  Months. Back up controller:   Replace back up battery in  _____  Months.    I reviewed the LVAD parameters from today, and compared the results to the patient's prior recorded data.  No programming changes were made.  The LVAD is functioning within specified parameters.  The patient performs LVAD self-test daily.  LVAD interrogation was negative for any significant power changes, alarms or PI events/speed drops.  LVAD equipment check completed and is in good working order.  Back-up equipment present.   LVAD education done on emergency procedures and precautions and reviewed exit site care.    Filed Vitals:   10/30/14 1140  BP: 92/0  Pulse: 94  Weight: 162 lb 6.4 oz (73.664 kg)  SpO2: 99%   MAP 92  Physical Exam: GENERAL: Well appearing, male who presents to clinic today in no acute distress with his wife.  HEENT: normal  NECK: Supple, JVP 8-9  .  2+ bilaterally, no bruits.  No lymphadenopathy or thyromegaly appreciated.   CARDIAC:  Mechanical heart sounds with LVAD hum present.  LUNGS:  Clear to auscultation bilaterally.   ABDOMEN:  Soft, round, nontender, positive bowel sounds x4.     LVAD exit site: well-healed and incorporated.  Dressing dry and intact.  No erythema or drainage.  Stabilization device present and accurately applied.  Driveline dressing is being changed daily per sterile technique. EXTREMITIES:  Warm and dry, no cyanosis, clubbing, rash or edema  NEUROLOGIC:  Alert and oriented x 4.  Gait steady.  No aphasia.  No dysarthria.  Affect pleasant.       ASSESSMENT AND PLAN:  1. Chronic systolic HF - Status post LVAD implantation for DT. - Patient comes to clinic today for routine follow up visit. Doing very well post VAD.  Currently has NYHA class  II-III symptoms on LVAD support.  MAP up a little. Add 12.5 mg spiro daily.  Volume status mildly elevated. Hold off on extra lasix for now.  As above add 12.5 mg spiro daily Discussed that he needs to take amoxicillin 2 grams pre dental procedures. Provided script.  Check LDH, CMET, CBC,PT INR 2.  Anticoagulation management - INR goal 2.0-3.0.  Will evaluate INR value today and follow up with patient for necessary changes.  See anticoagulation flow sheet  3. H/o Non Hodgkins Lymphoma s/p therapy 4.  H/O CVA 2008  5. H/O VT/VF --St Jude ICD 2010  6. DMII 7. PSVT: Quiescent on amiodarone.  Cut back to 200 mg daily.  9. Melena - Check CBC now.---> Hemoglobin 10.9 stable which is better than discharge.   Follow up weekly.   CLEGG,AMY NP-C  12:08 PM  Patient seen and examined with Darrick Grinder, NP. We discussed all aspects of the encounter. I agree with the assessment and plan as stated above.    He is doing very well s/p VAD therapy. VAD parameters stable. MAP slightly elevated. Will add spiro 12.5 daily. Will help with volume status as well. Encouraged him to remain active. Labs look good. F/u 1 week.   Obryan Radu,MD 11:23 PM

## 2014-10-30 NOTE — Progress Notes (Signed)
Symptom Yes No Details  Angina  x Activity:  Claudication  x How far:  Syncope  x When:  Stroke  x   Orthopnea x  How many pillows: 4; more out of habit  PND  x How often:  CPAP  x How many hrs:  Pedal edema  x   Abd fullness  x   N&V  x   Diaphoresis  x When:  Bleeding  x Reporting black stools   Urine  x Clear yellow  SOB  x Activity:  Palpitations  x When:  ICD shock  x   Hospitlizaitons  x When/where/why:  ED visit  x When/where/why:  Other MD  x When/who/why:  Activity  x Can walk up to 8 minutes now  Fluid  x < 2L   Diet  x Appetite is still only fair. Working on it.     First clinic visit after hospital discharge for LVAD. He looks great today and appears to be feeling well and in good spirits. Dressing is well maintained; wife is doing a great job and reinforced this with her.   BP: 92  Weight: 162.4 lbs HR: 94  SPO2: 99%  Last weight:    VAD interrogation revealed: Speed:  9000 Flow:  4.3 Power:  4.8 PI: 6.6  Alarms:  none Events:  12 - 25 PI's daily  Fixed speed: 9000 Low speed limit: 8400 Primary Controller:  Replace back up battery in  __32___  Months. Back up controller:   Replace back up battery in  __32___  Months.  I reviewed the LVAD parameters from today, and compared the results to the patient's prior recorded data.  No programming changes were made.  The LVAD is functioning within specified parameters.  The patient performs LVAD self-test daily.  LVAD interrogation was negative for any significant power changes or alarms; PI events noted to be about 12 - 25/day dropping to about 3.9 at the lowest; patient is not experiencing any dizziness.  LVAD equipment check completed and is in good working order.  Back-up equipment present.   LVAD education done on emergency procedures and precautions and reviewed exit site care. Pt is performing daily controller and system monitor self tests along with completing weekly and monthly maintenance for LVAD  equipment.  LVAD education done on emergency procedures and precautions and reviewed exit site care.   Reviewed the log book that the patient and his wife are keeping at home. PIs range from 4.5 - 5.5 usually; power ranges from 4 - 5 with some spikes noted > 7 - 11 range captured on data log. Reinforced with his wife that if she were to ever see a significant change in this number or if it were ever >10 she should check the numbers again periodically during the day to see if they are consistently elevated or only just briefly. If they are consistently elevated or are > 10 they are to call the VAD pager to report findings.   Reports that he is sleeping comfortably and getting around well with his equipment and with current wearables.   VAD dressing removed and site care performed using sterile technique. Drive line exit site cleaned with Chlora prep applicators x 2, allowed to dry, and gauze dressing with aquacel strip re-applied. Exit site healing, the velour is fully implanted at exit site. No erythema, tenderness, or foul odor noted; Scant tan drainage on aquacel strip. Drive line anchor in appropriate position and will leave. Pt denies fever or  chills. Driveline dressing is being changed daily per sterile technique and will continue with daily changes. Adequate dressing supplies were provided to the patient with recent hospital discharge.  Pt/caregiver deny any alarms or VAD equipment issues.   Removed all old chest tube sutures easily and left open to air. Removed proximal suture to the drive line today. Provided patient with shower bags today and will discuss use once able to advance dressings.

## 2014-10-30 NOTE — Patient Instructions (Addendum)
1. Doing great! Increase your activity everyday  2. For any dental procedure or cleaning, take 2 grams or 4 pills 30 - 60 minutes before.  3. Start Spironolactone 12.5  Mg (1/2 a pill) once daily. Start today.  4. Your INR is 2.08 today. No changes in you warfarin dose! We will check this at your next clinic visit.

## 2014-11-05 ENCOUNTER — Other Ambulatory Visit (HOSPITAL_COMMUNITY): Payer: Self-pay | Admitting: *Deleted

## 2014-11-05 ENCOUNTER — Ambulatory Visit (HOSPITAL_COMMUNITY): Payer: Self-pay | Admitting: *Deleted

## 2014-11-05 ENCOUNTER — Ambulatory Visit (HOSPITAL_COMMUNITY)
Admission: RE | Admit: 2014-11-05 | Discharge: 2014-11-05 | Disposition: A | Discharge status: Home / Self Care | Payer: Medicare Other | Source: Ambulatory Visit | Attending: Internal Medicine | Admitting: Internal Medicine

## 2014-11-05 VITALS — BP 90/0 | HR 86 | Ht 67.0 in | Wt 162.6 lb

## 2014-11-05 DIAGNOSIS — Z95811 Presence of heart assist device: Secondary | ICD-10-CM

## 2014-11-05 DIAGNOSIS — Z7901 Long term (current) use of anticoagulants: Secondary | ICD-10-CM

## 2014-11-05 DIAGNOSIS — R0600 Dyspnea, unspecified: Secondary | ICD-10-CM | POA: Diagnosis not present

## 2014-11-05 DIAGNOSIS — I5022 Chronic systolic (congestive) heart failure: Secondary | ICD-10-CM | POA: Diagnosis present

## 2014-11-05 LAB — BASIC METABOLIC PANEL
ANION GAP: 8 (ref 5–15)
BUN: 14 mg/dL (ref 6–23)
CALCIUM: 9.4 mg/dL (ref 8.4–10.5)
CHLORIDE: 104 mmol/L (ref 96–112)
CO2: 24 mmol/L (ref 19–32)
CREATININE: 1.13 mg/dL (ref 0.50–1.35)
GFR calc Af Amer: 85 mL/min — ABNORMAL LOW (ref >90)
GFR calc non Af Amer: 74 mL/min — ABNORMAL LOW (ref >90)
GLUCOSE: 148 mg/dL — AB (ref 70–99)
Potassium: 4.4 mmol/L (ref 3.5–5.1)
Sodium: 136 mmol/L (ref 135–145)

## 2014-11-05 LAB — BRAIN NATRIURETIC PEPTIDE: B NATRIURETIC PEPTIDE 5: 118.8 pg/mL — AB (ref 0.0–100.0)

## 2014-11-05 LAB — CBC
HCT: 32.4 % — ABNORMAL LOW (ref 39.0–52.0)
Hemoglobin: 10.6 g/dL — ABNORMAL LOW (ref 13.0–17.0)
MCH: 29.3 pg (ref 26.0–34.0)
MCHC: 32.7 g/dL (ref 30.0–36.0)
MCV: 89.5 fL (ref 78.0–100.0)
Platelets: 316 10*3/uL (ref 150–400)
RBC: 3.62 MIL/uL — ABNORMAL LOW (ref 4.22–5.81)
RDW: 15.6 % — AB (ref 11.5–15.5)
WBC: 8.6 10*3/uL (ref 4.0–10.5)

## 2014-11-05 LAB — PROTIME-INR
INR: 3.89 — ABNORMAL HIGH (ref 0.00–1.49)
PROTHROMBIN TIME: 38.5 s — AB (ref 11.6–15.2)

## 2014-11-05 LAB — LACTATE DEHYDROGENASE: LDH: 323 U/L — AB (ref 94–250)

## 2014-11-05 NOTE — Patient Instructions (Signed)
1. Hold Warfarin today, then decrease dose to 5 mg daily except 7.5 mg on Mon/Wed/Fri. 2. Increase dressing changes to every other day; if drainage occurs, or change in site, go back to daily dressing changes. 3. Return to [place] clinic in one week.

## 2014-11-05 NOTE — Progress Notes (Signed)
Symptom  Yes  No  Details   Angina        x Activity:   Claudication        x       How far: walking one block  Syncope        x When:   Stroke        x    Orthopnea        x How many pillows:  1-5 for comfort   PND        x How often:  CPAP     N/A How many hrs:   Pedal edema        x   Abd fullness        x   N&V        x  appetite fair; eating full meals at times  Diaphoresis        x When:  Bleeding       x   Urine color   Dark yellow - yellow   SOB        x Activity:   Palpitations        x When:  ICD shock        x   Hospitlizaitons        x When/where/why:  ED visit        x When/where/why:  Other MD        x When/who/why:  Activity    No limitations; home stairs, gardening, errands  Fluid    1 - 2 liters  Diet    Low sodium   Vital signs: HR:  86 MAP BP:  90 O2 Sat:  99 Wt:  162.6 lbs Last wt:  162.4 lbs Ht: 5'7"  LVAD interrogation reveals:  Speed:  8990 Flow:  4.4 Power:  4.8 PI: 6.2 Alarms: none  Events:   5 - 15 Fixed speed:  9000 Low speed limit:  8400 Primary Controller:  Replace back up battery in  26 months. Back up controller:   Replace back up battery in 26 months.  I reviewed the LVAD parameters from today, and compared the results to the patient's prior recorded data. No programming changes were made. The LVAD is functioning within specified parameters. The patient performs LVAD self-test daily. LVAD interrogation was negative for any significant power changes or alarms. LVAD equipment check completed and is in good working order. Back-up equipment present. LVAD education done on emergency procedures and precautions and reviewed exit site care. Pt is performing daily controller and system monitor self tests along with completing weekly and monthly maintenance for LVAD equipment. LVAD education done on emergency procedures and precautions and reviewed exit site care.   Reviewed the log book that the patient and his wife are keeping at home  including med list.  Pt was taking wrong dose of coumadin, reports taking 7.5 mg daily instead of 7.5 daily except 5 on Tus/Thu/Sat. INR high today at 3.89; instructed wife to hold dose today then decrease weekly dose to 7.5 mg daily except 5 mg on MWF per PharmD. Verbal and written instructions given; wife verbalized understanding of same.  Reports that he is sleeping comfortably and getting around well with his equipment and with current wearables.   VAD dressing removed and site care performed using sterile technique. Drive line exit site cleaned with Chlora prep applicators x 2, allowed to dry, and gauze dressing with aquacel strip re-applied. Exit site healing, the velour is fully  implanted at exit site. No erythema, tenderness, or foul odor noted; Scant bloody drainage on aquacel strip. Drive line anchor in appropriate replaced and repositioned.  Pt denies fever or chills. Driveline dressing is being changed daily per sterile technique; will advance to every other day dressing changes. Instructed wife to call if any change in site; if she notices drainage, we will need to do daily dressing changes. Both report they have aequate dressing supplies for home use. Pt/caregiver deny any alarms or VAD equipment issues.      Zada Girt, RN

## 2014-11-05 NOTE — Progress Notes (Signed)
Patient ID: Andre Tran, male   DOB: [birth year], 54 y.o.   MRN: [id number]   HPI: Andre Tran is a 54 y.o. 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 Underwent HMII LVAD placement 10/13/2014   Admitted April 1st 2016 for schedule HMII implant. Post implant he was taken back to the OR for bleeding otherwise his post operative course was uneventful. LVAD speed turned down to 9000 on the day of discharge. Discharge weight was 160 pounds.   Follow up for Heart Failure/LVAD: Continues to do very well s/p recent VAD placement. Doing all activities without much limitation. Denies SOB/PND/Orthopnea. Denies dizziness. Wife changing dressing daily. Following low salt diet.  Appetite ok. Weight at home stable  Has not needed lasix. Taking all medications. No edema.   Denies LVAD alarms.  Denies driveline trauma, erythema or drainage.  Denies ICD shocks.   Reports taking Coumadin as prescribed and adherence to anticoagulation based dietary restrictions.  Denies bright red blood per rectum or melena, no dark urine or hematuria.     Past Medical History  Diagnosis Date  . Paroxysmal ventricular tachycardia 2005, 2010    s/p AICD '05, replaced w/ [employer] Jude's in 2010 (PVT/V.Fib arrest requiring ICD implant in 2005)  . HYPERTENSION, UNSPECIFIED   . HYPERLIPIDEMIA-MIXED   . 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 Nonischemic cardiomyopathy     2/2 Adriamycin administration for lymphoma  . CHF (congestive heart failure)     EF 15% by echo 2009; s/p St. Jude ICD  . Diabetes mellitus      Type 2  . Cancer 1992, 2007     non hodkins lymphoma 1992, Hodgkins 2007  . Depression   . LV (left ventricular) mural thrombus 2009    2009, on coumadin  . Small bowel obstruction 2001    s/p Small bowel resection 2001  . Jejunal intussusception 2008    2008  . 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 Splenic mass 2009    Noted on Abd [place] 2009 w/ recs for f/u CT - not done  . Hypotension   . Noncompliance with medication regimen   . AICD (automatic cardioverter/defibrillator) present 2005, 2010  . Presence of permanent cardiac pacemaker     Current Outpatient Prescriptions  Medication Sig Dispense Refill  . amiodarone (PACERONE) 200 MG tablet Take 1 tablet (200 mg total) by mouth daily. 30 tablet 6  . aspirin EC 325 MG EC tablet Take 1 tablet (325 mg total) by mouth daily. 30 tablet 6  . atorvastatin (LIPITOR) 40 MG tablet Take 0.5 tablets (20 mg total) by mouth at bedtime. 30 tablet 6  . bisacodyl (DULCOLAX) 5 MG EC tablet Take 1 tablet (5 mg total) by mouth daily as needed for moderate constipation. 30 tablet 0  . docusate sodium (COLACE) 100 MG capsule Take 1 capsule (100 mg total) by mouth 2 (two) times daily. 10 capsule 0  . glipiZIDE (GLUCOTROL) 5 MG tablet Take 1 tablet (5 mg total) by mouth every evening. 30 tablet 6  .  hydrALAZINE (APRESOLINE) 25 MG tablet Take 1.5 tablets (37.5 mg total) by mouth every 8 (eight) hours. 90 tablet 6  . insulin glargine (LANTUS) 100 UNIT/ML injection Inject 0.05 mLs (5 Units total) into the skin at bedtime. 10 mL 11  . isosorbide mononitrate (IMDUR) 30 MG 24 hr tablet Take 1 tablet (30 mg total) by mouth daily. 30 tablet 6  . Magnesium 400 MG TABS Take 1 tablet by mouth daily with breakfast. 90 tablet 3  . pantoprazole (PROTONIX) 40 MG tablet Take 1 tablet (40 mg total) by mouth daily. 30 tablet 6  . spironolactone (ALDACTONE) 25 MG tablet Take 1 tablet (25 mg total) by mouth daily. 90 tablet 3  . warfarin  (COUMADIN) 5 MG tablet Take 1 1/2 tablets daily. 45 tablet 3  . albuterol (PROVENTIL HFA;VENTOLIN HFA) 108 (90 BASE) MCG/ACT inhaler Inhale 1-2 puffs into the lungs every 6 (six) hours as needed for wheezing or shortness of breath.    Marland Kitchen amoxicillin (AMOXIL) 500 MG capsule Take 4 capsules (2,000 mg total) by mouth as needed (Take 2g (4 pills) 30 - 60 minutes before any dental procedure or cleaning). (Patient not taking: Reported on 11/05/2014) 12 capsule 3  . furosemide (LASIX) 40 MG tablet Take 0.5 tablets (20 mg total) by mouth as needed. (Patient not taking: Reported on 11/05/2014) 30 tablet   . traMADol (ULTRAM) 50 MG tablet Take 1-2 tablets (50-100 mg total) by mouth every 6 (six) hours as needed for moderate pain. (Patient not taking: Reported on 11/05/2014) 30 tablet 0   No current facility-administered medications for this encounter.    Review of patient's allergies indicates no known allergies.  REVIEW OF SYSTEMS: All systems negative except as listed in HPI, PMH and Problem list.   LVAD INTERROGATION:  HMII VAD: Speed: 8990  Flow: 4.4  Power: 4.8  PI: 6.2  Alarms: none  Events: 5 - 15  Fixed speed: 9000  Low speed limit: 8400  Primary Controller: Replace back up battery in 26 months.  Back up controller: Replace back up battery in 26 months.    I reviewed the LVAD parameters from today, and compared the results to the patient's prior recorded data.  No programming changes were made.  The LVAD is functioning within specified parameters.  The patient performs LVAD self-test daily.  LVAD interrogation was negative for any significant power changes, alarms or PI events/speed drops.  LVAD equipment check completed and is in good working order.  Back-up equipment present.   LVAD education done on emergency procedures and precautions and reviewed exit site care.    Filed Vitals:   11/05/14 1027  BP: 90/0  Pulse: 86  Height: 5\' 7"  (1.702 m)  Weight: 162 lb 9.6 oz (73.755 kg)   SpO2: 99%   MAP 86 Physical Exam: GENERAL: Well appearing, male who presents to clinic today in no acute distress with his wife.  HEENT: normal  NECK: Supple, JVP 5-6  .  2+ bilaterally, no bruits.  No lymphadenopathy or thyromegaly appreciated.   CARDIAC:  Mechanical heart sounds with LVAD hum present.  LUNGS:  Clear to auscultation bilaterally.  ABDOMEN:  Soft, round, nontender, positive bowel sounds x4.     LVAD exit site: well-healed and incorporated.  Dressing dry and intact.  No erythema or drainage.  Stabilization device present and accurately applied.  Driveline dressing is being changed daily per sterile technique. EXTREMITIES:  Warm and dry, no cyanosis, clubbing, rash or edema  NEUROLOGIC:  Alert and  oriented x 4.  Gait steady.  No aphasia.  No dysarthria.  Affect pleasant.       ASSESSMENT AND PLAN:  1. Chronic systolic HF - Status post LVAD implantation for DT. - Patient comes to clinic today for routine follow up visit. Doing very well post VAD.  Currently has NYHA class IIsymptoms on LVAD support.  MAP improved on spiro Volume status looks good.  Continue activity. VAD parameters stable.  Check LDH, CMET, CBC,PT INR 2.  Anticoagulation management - INR goal 2.0-3.0.  Will evaluate INR value today and follow up with patient for necessary changes.  See anticoagulation flow sheet  3. H/o Non Hodgkins Lymphoma s/p therapy 4.  H/O CVA 2008  5. H/O VT/VF --St Jude ICD 2010  6. DMII 7. 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   Follow up weekly.   Glori Bickers MD  4:58 PM

## 2014-11-06 ENCOUNTER — Encounter (HOSPITAL_COMMUNITY): Payer: Medicare Other

## 2014-11-13 ENCOUNTER — Encounter: Payer: Self-pay | Admitting: Licensed Clinical Social Worker

## 2014-11-13 ENCOUNTER — Ambulatory Visit (HOSPITAL_COMMUNITY)
Admission: RE | Admit: 2014-11-13 | Discharge: 2014-11-13 | Disposition: A | Discharge status: Home / Self Care | Payer: Medicare Other | Source: Ambulatory Visit | Attending: Internal Medicine | Admitting: Internal Medicine

## 2014-11-13 ENCOUNTER — Ambulatory Visit (HOSPITAL_COMMUNITY): Payer: Self-pay | Admitting: Infectious Diseases

## 2014-11-13 ENCOUNTER — Encounter (HOSPITAL_COMMUNITY): Payer: Self-pay

## 2014-11-13 ENCOUNTER — Other Ambulatory Visit (HOSPITAL_COMMUNITY): Payer: Self-pay | Admitting: Infectious Diseases

## 2014-11-13 DIAGNOSIS — Z95811 Presence of heart assist device: Secondary | ICD-10-CM | POA: Diagnosis not present

## 2014-11-13 DIAGNOSIS — Z7901 Long term (current) use of anticoagulants: Secondary | ICD-10-CM | POA: Diagnosis not present

## 2014-11-13 DIAGNOSIS — I5022 Chronic systolic (congestive) heart failure: Secondary | ICD-10-CM | POA: Insufficient documentation

## 2014-11-13 DIAGNOSIS — I471 Supraventricular tachycardia: Secondary | ICD-10-CM | POA: Insufficient documentation

## 2014-11-13 DIAGNOSIS — I69351 Hemiplegia and hemiparesis following cerebral infarction affecting right dominant side: Secondary | ICD-10-CM | POA: Insufficient documentation

## 2014-11-13 DIAGNOSIS — E119 Type 2 diabetes mellitus without complications: Secondary | ICD-10-CM | POA: Diagnosis not present

## 2014-11-13 LAB — CBC
HCT: 38.5 % — ABNORMAL LOW (ref 39.0–52.0)
HEMOGLOBIN: 12.5 g/dL — AB (ref 13.0–17.0)
MCH: 29.3 pg (ref 26.0–34.0)
MCHC: 32.5 g/dL (ref 30.0–36.0)
MCV: 90.2 fL (ref 78.0–100.0)
Platelets: 335 10*3/uL (ref 150–400)
RBC: 4.27 MIL/uL (ref 4.22–5.81)
RDW: 15.6 % — ABNORMAL HIGH (ref 11.5–15.5)
WBC: 10.4 10*3/uL (ref 4.0–10.5)

## 2014-11-13 LAB — PROTIME-INR
INR: 2.61 — ABNORMAL HIGH (ref 0.00–1.49)
Prothrombin Time: 28.2 seconds — ABNORMAL HIGH (ref 11.6–15.2)

## 2014-11-13 LAB — COMPREHENSIVE METABOLIC PANEL
ALK PHOS: 130 U/L — AB (ref 38–126)
ALT: 25 U/L (ref 17–63)
AST: 31 U/L (ref 15–41)
Albumin: 4.5 g/dL (ref 3.5–5.0)
Anion gap: 13 (ref 5–15)
BUN: 13 mg/dL (ref 6–20)
CHLORIDE: 102 mmol/L (ref 101–111)
CO2: 22 mmol/L (ref 22–32)
Calcium: 10.1 mg/dL (ref 8.9–10.3)
Creatinine, Ser: 1.18 mg/dL (ref 0.61–1.24)
GFR calc Af Amer: >60 mL/min (ref >60)
GFR calc non Af Amer: >60 mL/min (ref >60)
Glucose, Bld: 101 mg/dL — ABNORMAL HIGH (ref 70–99)
Potassium: 4.3 mmol/L (ref 3.5–5.1)
Sodium: 137 mmol/L (ref 135–145)
Total Bilirubin: 1 mg/dL (ref 0.3–1.2)
Total Protein: 8.9 g/dL — ABNORMAL HIGH (ref 6.5–8.1)

## 2014-11-13 LAB — LACTATE DEHYDROGENASE: LDH: 416 U/L — ABNORMAL HIGH (ref 98–192)

## 2014-11-13 NOTE — Patient Instructions (Signed)
1. Your labs are perfect!! INR 2.6 2. No changes in your medications. Continue on! 3. Continue every other day dressing changes for now.  4. Follow up in 1 week!

## 2014-11-13 NOTE — Progress Notes (Signed)
Symptom  Yes  No  Details   Angina        x Activity:   Claudication        x       How far: walking one block  Syncope        x When:   Stroke        x    Orthopnea        x How many pillows:  1-5 for comfort   PND        x How often:  CPAP     N/A How many hrs:   Pedal edema        x   Abd fullness        x   N&V        x  appetite fair; eating full meals at times  Diaphoresis        x When:  Bleeding       x   Urine color   Dark yellow - yellow   SOB        x Activity:   Palpitations        x When:  ICD shock        x   Hospitlizaitons        x When/where/why:  ED visit        x When/where/why:  Other MD        x When/who/why:  Activity    No limitations; home stairs, gardening, errands  Fluid    1 - 2 liters  Diet    Low sodium   Vital signs: MAP BP:  96 Auto Cuff: 100/36 (57) O2 Sat:  100% Wt:  162.6 lbs Last wt:  162.6 lbs Ht: 5'7"  LVAD interrogation reveals:  Speed:  9000 Flow: 6.8 Power:  5.2 PI: 6.8 Alarms: Low Voltage/No External Power one time where he disconnected both lines  Events:   20 - 25 per day most days Fixed speed:  9000 Low speed limit:  8400 Primary Controller:  Replace back up battery in  26 months. Back up controller:   Replace back up battery in 26 months.  I reviewed the LVAD parameters from today, and compared the results to the patient's prior recorded data. No programming changes were made. The LVAD is functioning within specified parameters. The patient performs LVAD self-test daily. LVAD interrogation was negative for any significant power changes or alarms. LVAD equipment check completed and is in good working order. Back-up equipment present. LVAD education done on emergency procedures and precautions and reviewed exit site care. Pt is performing daily controller and system monitor self tests along with completing weekly and monthly maintenance for LVAD equipment. LVAD education done on emergency procedures and precautions and  reviewed exit site care.   Reviewed the log book that the patient and his wife are keeping at home including med list.  Pt was taking wrong dose of coumadin, reports taking 7.5 mg daily instead of 7.5 daily except 5 on Tus/Thu/Sat. INR high today at 3.89; instructed wife to hold dose today then decrease weekly dose to 7.5 mg daily except 5 mg on MWF per PharmD. Verbal and written instructions given; wife verbalized understanding of same.  Reports that he is sleeping comfortably and getting around well with his equipment and with current wearables.   VAD dressing removed and site care performed using sterile technique. Drive line exit site cleaned with Chlora prep applicators x 2, allowed to dry,  and gauze dressing with aquacel strip re-applied. Exit site healing, the velour is fully implanted at exit site. No erythema, tenderness, or foul odor noted; Scant bloody drainage on aquacel strip. Drive line anchor in appropriate replaced and repositioned.  Pt denies fever or chills. Driveline dressing is being changed daily per sterile technique; will advance to every other day dressing changes. Instructed wife to call if any change in site; if she notices drainage, we will need to do daily dressing changes. Both report they have aequate dressing supplies for home use. Pt/caregiver deny any alarms or VAD equipment issues.      Arloa Koh, RN

## 2014-11-13 NOTE — Progress Notes (Signed)
Patient ID: Andre Tran, male   DOB: Nov 09, 1960, 54 y.o.   MRN: [id number]   HPI: Andre Tran is a 54 y.o. 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 Underwent HMII LVAD placement 10/13/2014   Admitted April 1st 2016 for schedule HMII implant. Post implant he was taken back to the OR for bleeding otherwise his post operative course was uneventful. LVAD speed turned down to 9000 on the day of discharge. Discharge weight was 160 pounds.   Follow up for Heart Failure/LVAD: Continues to do very well s/p recent VAD placement. Doing all activities without much limitation. Feels very good. No edema. Denies SOB/PND/Orthopnea. Denies dizziness. Wife changing dressing daily. Following low salt diet.  Weight at home stable  Has not needed lasix. Taking all medications. Has not needed lasix.   One LVAD alarm: Low Voltage/No External Power one time where he disconnected both lines     Denies driveline trauma, erythema or drainage.  Denies ICD shocks.   Reports taking Coumadin as prescribed and adherence to anticoagulation based dietary restrictions.  Denies bright red blood per rectum or melena, no dark urine or hematuria.     Past Medical History  Diagnosis Date  . Paroxysmal ventricular tachycardia 2005, 2010    s/p AICD '05, replaced w/ St. Jude's in 2010 (PVT/V.Fib arrest requiring ICD implant in 2005)  . HYPERTENSION, UNSPECIFIED   . HYPERLIPIDEMIA-MIXED   . 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 Nonischemic cardiomyopathy     2/2 Adriamycin administration for lymphoma  .  CHF (congestive heart failure)     EF 15% by echo 2009; s/p St. Jude ICD  . Diabetes mellitus     Type 2  . Cancer 1992, 2007     non hodkins lymphoma 1992, Hodgkins 2007  . Depression   . LV (left ventricular) mural thrombus 2009    2009, on coumadin  . Small bowel obstruction 2001    s/p Small bowel resection 2001  . Jejunal intussusception 2008    2008  . 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 Splenic mass 2009    Noted on Abd [place] 2009 w/ recs for f/u CT - not done  . Hypotension   . Noncompliance with medication regimen   . AICD (automatic cardioverter/defibrillator) present 2005, 2010  . Presence of permanent cardiac pacemaker     Current Outpatient Prescriptions  Medication Sig Dispense Refill  . albuterol (PROVENTIL HFA;VENTOLIN HFA) 108 (90 BASE) MCG/ACT inhaler Inhale 1-2 puffs into the lungs every 6 (six) hours as needed for wheezing or shortness of breath.    Marland Kitchen amiodarone (PACERONE) 200 MG tablet Take 1 tablet (200 mg total) by mouth daily. 30 tablet 6  . aspirin EC 325 MG EC tablet Take 1 tablet (325 mg total) by mouth daily. 30 tablet 6  . atorvastatin (LIPITOR) 40 MG tablet Take 0.5 tablets (20 mg total) by mouth at bedtime. 30 tablet 6  . bisacodyl (DULCOLAX) 5 MG EC tablet Take 1 tablet (5 mg total) by mouth daily as needed for  moderate constipation. 30 tablet 0  . docusate sodium (COLACE) 100 MG capsule Take 1 capsule (100 mg total) by mouth 2 (two) times daily. 10 capsule 0  . furosemide (LASIX) 40 MG tablet Take 0.5 tablets (20 mg total) by mouth as needed. 30 tablet   . glipiZIDE (GLUCOTROL) 5 MG tablet Take 1 tablet (5 mg total) by mouth every evening. 30 tablet 6  . hydrALAZINE (APRESOLINE) 25 MG tablet Take 1.5 tablets (37.5 mg total) by mouth every 8 (eight) hours. 90 tablet 6  . insulin glargine (LANTUS) 100 UNIT/ML injection Inject 0.05 mLs (5 Units total) into the skin at bedtime. 10 mL 11  . isosorbide mononitrate  (IMDUR) 30 MG 24 hr tablet Take 1 tablet (30 mg total) by mouth daily. 30 tablet 6  . Magnesium 400 MG TABS Take 1 tablet by mouth daily with breakfast. 90 tablet 3  . pantoprazole (PROTONIX) 40 MG tablet Take 1 tablet (40 mg total) by mouth daily. 30 tablet 6  . spironolactone (ALDACTONE) 25 MG tablet Take 1 tablet (25 mg total) by mouth daily. 90 tablet 3  . warfarin (COUMADIN) 5 MG tablet Take 1 1/2 tablets daily. 45 tablet 3  . amoxicillin (AMOXIL) 500 MG capsule Take 4 capsules (2,000 mg total) by mouth as needed (Take 2g (4 pills) 30 - 60 minutes before any dental procedure or cleaning). (Patient not taking: Reported on 11/13/2014) 12 capsule 3  . traMADol (ULTRAM) 50 MG tablet Take 1-2 tablets (50-100 mg total) by mouth every 6 (six) hours as needed for moderate pain. (Patient not taking: Reported on 11/13/2014) 30 tablet 0   No current facility-administered medications for this encounter.    Review of patient's allergies indicates no known allergies.  REVIEW OF SYSTEMS: All systems negative except as listed in HPI, PMH and Problem list.   LVAD INTERROGATION:  HMII VAD Speed: 9000 Flow: 6.8 Power: 5.2 PI: 6.8 Alarms: Low Voltage/No External Power one time where he disconnected both lines  Events: 20 - 25 per day most days Fixed speed: 9000 Low speed limit: 8400 Primary Controller: Replace back up battery in 26 months.  Back up controller: Replace back up battery in 26 months.    I reviewed the LVAD parameters from today, and compared the results to the patient's prior recorded data.  No programming changes were made.  The LVAD is functioning within specified parameters.  The patient performs LVAD self-test daily.  LVAD interrogation was negative for any significant power changes, alarms or PI events/speed drops.  LVAD equipment check completed and is in good working order.  Back-up equipment present.   LVAD education done on emergency procedures and precautions and reviewed  exit site care.    Filed Vitals:   11/13/14 1330  BP: 96/0  Pulse: 85  Height: 5\' 7"  (1.702 m)  Weight: 162 lb 6.4 oz (73.664 kg)  SpO2: 94%   MAP 90 Physical Exam: GENERAL: Well appearing, male who presents to clinic today in no acute distress with his wife.  HEENT: normal  NECK: Supple, JVP flat  .  2+ bilaterally, no bruits.  No lymphadenopathy or thyromegaly appreciated.   CARDIAC:  Mechanical heart sounds with LVAD hum present.  LUNGS:  Clear to auscultation bilaterally.  ABDOMEN:  Soft, round, nontender, positive bowel sounds x4.     LVAD exit site: well-healed and incorporated.  Dressing dry and intact.  No erythema or drainage.  Stabilization device present and accurately applied.  Driveline dressing is being changed  daily per sterile technique. EXTREMITIES:  Warm and dry, no cyanosis, clubbing, rash or edema  NEUROLOGIC:  Alert and oriented x 4.  Gait steady.  No aphasia.  No dysarthria.  Affect pleasant.       ASSESSMENT AND PLAN:  1. Chronic systolic HF - Status post LVAD implantation for DT.  Doing great. NYHA I-II/  MAP improved on spiro Volume status looks good.  Continue activity. VAD parameters stable.  Reinforced need to be very careful when changing power sources not to disconnect both lines at the same time.  Check LDH, CMET, CBC,PT INR 2.  Anticoagulation management - INR goal 2.0-3.0.  Will evaluate INR value today and follow up with patient for necessary changes.  See anticoagulation flow sheet  3. H/o Non Hodgkins Lymphoma s/p therapy 4.  H/O CVA 2008  5. H/O VT/VF --St Jude ICD 2010  6. DMII 7. 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   Follow up next week then move to monthly.   Glori Bickers MD  2:33 PM

## 2014-11-17 NOTE — Progress Notes (Signed)
CSW met with patient in the clinic for follow up VAD visit. Patient reports he is doing well and denies any concerns at home. Patient was very positive and so grateful for LVAD implant and opportunity to have improved lifestyle. Patient appears to be adjusting well to life with LVAD and states plans to attend Support Group this month. CSW will continue to follow for support as needed. Raquel Sarna, [place]

## 2014-11-18 ENCOUNTER — Encounter: Payer: Self-pay | Admitting: Internal Medicine

## 2014-11-20 ENCOUNTER — Encounter (HOSPITAL_COMMUNITY): Payer: Self-pay

## 2014-11-20 ENCOUNTER — Encounter: Payer: Self-pay | Admitting: Licensed Clinical Social Worker

## 2014-11-20 ENCOUNTER — Ambulatory Visit (HOSPITAL_COMMUNITY): Payer: Self-pay | Admitting: Infectious Diseases

## 2014-11-20 ENCOUNTER — Ambulatory Visit (HOSPITAL_COMMUNITY)
Admission: RE | Admit: 2014-11-20 | Discharge: 2014-11-20 | Disposition: A | Discharge status: Home / Self Care | Payer: Medicare Other | Source: Ambulatory Visit | Attending: Internal Medicine | Admitting: Internal Medicine

## 2014-11-20 ENCOUNTER — Other Ambulatory Visit (HOSPITAL_COMMUNITY): Payer: Self-pay | Admitting: Infectious Diseases

## 2014-11-20 VITALS — HR 102 | Wt 157.5 lb

## 2014-11-20 DIAGNOSIS — Z48812 Encounter for surgical aftercare following surgery on the circulatory system: Secondary | ICD-10-CM | POA: Insufficient documentation

## 2014-11-20 DIAGNOSIS — Z5181 Encounter for therapeutic drug level monitoring: Secondary | ICD-10-CM | POA: Insufficient documentation

## 2014-11-20 DIAGNOSIS — I5022 Chronic systolic (congestive) heart failure: Secondary | ICD-10-CM | POA: Diagnosis not present

## 2014-11-20 DIAGNOSIS — Z7901 Long term (current) use of anticoagulants: Secondary | ICD-10-CM

## 2014-11-20 DIAGNOSIS — Z95811 Presence of heart assist device: Secondary | ICD-10-CM | POA: Diagnosis not present

## 2014-11-20 LAB — BASIC METABOLIC PANEL
Anion gap: 12 (ref 5–15)
BUN: 21 mg/dL — AB (ref 6–20)
CHLORIDE: 101 mmol/L (ref 101–111)
CO2: 22 mmol/L (ref 22–32)
Calcium: 9.6 mg/dL (ref 8.9–10.3)
Creatinine, Ser: 1.26 mg/dL — ABNORMAL HIGH (ref 0.61–1.24)
GFR calc non Af Amer: >60 mL/min (ref >60)
Glucose, Bld: 146 mg/dL — ABNORMAL HIGH (ref 65–99)
Potassium: 4.8 mmol/L (ref 3.5–5.1)
Sodium: 135 mmol/L (ref 135–145)

## 2014-11-20 LAB — PROTIME-INR
INR: 2.09 — ABNORMAL HIGH (ref 0.00–1.49)
PROTHROMBIN TIME: 23.6 s — AB (ref 11.6–15.2)

## 2014-11-20 LAB — CBC
HEMATOCRIT: 35.2 % — AB (ref 39.0–52.0)
Hemoglobin: 11.5 g/dL — ABNORMAL LOW (ref 13.0–17.0)
MCH: 28.5 pg (ref 26.0–34.0)
MCHC: 32.7 g/dL (ref 30.0–36.0)
MCV: 87.1 fL (ref 78.0–100.0)
Platelets: 303 10*3/uL (ref 150–400)
RBC: 4.04 MIL/uL — ABNORMAL LOW (ref 4.22–5.81)
RDW: 15.1 % (ref 11.5–15.5)
WBC: 10.5 10*3/uL (ref 4.0–10.5)

## 2014-11-20 LAB — LACTATE DEHYDROGENASE: LDH: 479 U/L — ABNORMAL HIGH (ref 98–192)

## 2014-11-20 NOTE — Progress Notes (Signed)
Patient and wife requested to see CSW for support. Wife states patient has responsibility of documenting his "numbers" everyday and the other day stated he was "having a bad day and didn't do it". Patient reports he was having a bad day and "just didn't feel up to it". Patient and wife both admit to frustration and still adjusting to life with a VAD. Wife spoke of her feelings of "responsibility for his care" and "need to be aware of everything" to report to medical team if asked. Patient stated "I was just having a bad day" and "I got over it". CSW provided supportive intervention and encouraged communication about feelings and responsibilities. Patient and wife both felt relieved after discussion and will attempt to improve communication on the difficult days. CSW will continue to follow and be available as needed. Raquel Sarna, [place]

## 2014-11-20 NOTE — Progress Notes (Signed)
Symptom  Yes  No  Details   Angina        x Activity:   Claudication        x       How far: walking one block  Syncope        x When:   Stroke        x  Right sided residuals and neuro residuals   Orthopnea        x How many pillows:  1-5 for comfort   PND        x How often:  CPAP     N/A How many hrs:   Pedal edema        x   Abd fullness        x   N&V        x  appetite fair; eating full meals at times  Diaphoresis        x When:  Bleeding       x   Urine color   Dark yellow - yellow   SOB        x Activity:   Palpitations        x When:  ICD shock        x   Hospitlizaitons        x When/where/why:  ED visit        x When/where/why:  Other MD        x When/who/why:  Activity         No limitations; home stairs, gardening, errands, walking 4.5 blocks 3 days a week.   Fluid    1 - 2 liters  Diet    Low sodium   Vital signs: HR: 98 MAP BP: 90 doppler  O2 Sat:  98% Wt:  157.8 lbs (home weights varying between 149 - 152. Has lost 7 lbs since beginning of May unintentionally) Last wt:  162.4 lbs Ht: 5'7"  LVAD interrogation & Equipment Check: Speed:  9000  Flow:  4.2 Power: 4.7 PI: 4.9 Alarms: none  Events: 41 PI events today, >30 daily last 2 days. Was previously having 5 - 15 daily  Fixed speed:  9000 Low speed limit:  8400 Primary Controller:  Replace back up battery in  26 months. Back up controller:   Replace back up battery in 26 months.  I reviewed the LVAD parameters from today, and compared the results to the patient's prior recorded data. No programming changes were made. The LVAD is functioning within specified parameters. The patient performs LVAD self-test daily. LVAD interrogation was negative for any significant power changes or alarms. LVAD equipment check completed and is in good working order. Back-up equipment present. LVAD education done on emergency procedures and precautions and reviewed exit site care. Pt is performing daily controller and  system monitor self tests along with completing weekly and monthly maintenance for LVAD equipment. LVAD education done on emergency procedures and precautions and reviewed exit site care.   Encounter Details: Had some leg pain and chest pain the other day, no appetite on that same day. Has lost 7 lbs since the beginning of May. Activity increased and appetite down.   Reviewed the log book that the patient and his wife are keeping at home including med list.  Pt was taking wrong dose of coumadin, reports taking 7.5 mg daily instead of 7.5 daily except 5 on Tus/Thu/Sat. INR high today at 3.89; instructed wife to hold dose today then  decrease weekly dose to 7.5 mg daily except 5 mg on MWF per PharmD. Verbal and written instructions given; wife verbalized understanding of same.  Reports that he is sleeping comfortably and getting around well with his equipment and with current wearables.   Samer has had 41 PI events today so far. Unsymptomatic and does not c/o dizziness. St. Jude Device interrogated by [employer]. Patient is in NSR today but of note his atrial threshold is 5v since the LVAD surgery on 10/13/14. Bryan d/w Dr. Lovena Le and placed patient on VVI @ 40 back up to conserve generator life. He has an intact SA node and has never needed his atrial or ventricular pacer per interrogation.    Driveline Site: VAD dressing removed and site care performed using sterile technique. Drive line exit site cleaned with Chlora prep applicators x 2, allowed to dry, and gauze dressing with aquacel strip re-applied. Exit site healing although driveline is still mobile without full tissue ingrowth, the velour is fully implanted at exit site. No erythema, tenderness, or foul odor noted; small yellow crusts on Aquacel strip. Drive line anchor in appropriate position and replaced.  Pt denies fever or chills. Driveline dressing is being changed Q3D per sterile technique. Instructed wife to call if any change in site; if  she notices drainage, we will need to do daily dressing changes.   [name], RN VAD Coordinator  Office: [phone] 24/7 VAD Pager: [phone]

## 2014-11-20 NOTE — Progress Notes (Signed)
Patient ID: Andre Tran, male   DOB: September 05, 1960, 54 y.o.   MRN: [id number]   HPI: [name] is a 54 y.o. 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 Underwent HMII LVAD placement 10/13/2014   Admitted April 1st 2016 for schedule HMII implant. Post implant he was taken back to the OR for bleeding otherwise his post operative course was uneventful. LVAD speed turned down to 9000 on the day of discharge. Discharge weight was 160 pounds.   Follow up for Heart Failure/LVAD: Continues to do very well s/p recent VAD placement. Weight at home trending down to 149 pounds. . Denies SOB/PND/Orthopnea. Denies dizziness. Not hungry. Wife changing dressing daily. Following low salt diet. Taking all medications.    One LVAD alarm: Low Voltage/No External Power one time where he disconnected both lines     Denies driveline trauma, erythema or drainage.  Denies ICD shocks.   Reports taking Coumadin as prescribed and adherence to anticoagulation based dietary restrictions.  Denies bright red blood per rectum or melena, no dark urine or hematuria.     Past Medical History  Diagnosis Date  . Paroxysmal ventricular tachycardia 2005, 2010    s/p AICD '05, replaced w/ St. Jude's in 2010 (PVT/V.Fib arrest requiring ICD implant in 2005)  . HYPERTENSION, UNSPECIFIED   . HYPERLIPIDEMIA-MIXED   . 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 Nonischemic cardiomyopathy     2/2 Adriamycin administration for lymphoma  . CHF (congestive heart failure)     EF 15% by echo 2009; s/p St. Jude ICD  .  Diabetes mellitus     Type 2  . Cancer 1992, 2007     non hodkins lymphoma 1992, Hodgkins 2007  . Depression   . LV (left ventricular) mural thrombus 2009    2009, on coumadin  . Small bowel obstruction 2001    s/p Small bowel resection 2001  . Jejunal intussusception 2008    2008  . 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 Splenic mass 2009    Noted on Abd [place] 2009 w/ recs for f/u CT - not done  . Hypotension   . Noncompliance with medication regimen   . AICD (automatic cardioverter/defibrillator) present 2005, 2010  . Presence of permanent cardiac pacemaker     Current Outpatient Prescriptions  Medication Sig Dispense Refill  . albuterol (PROVENTIL HFA;VENTOLIN HFA) 108 (90 BASE) MCG/ACT inhaler Inhale 1-2 puffs into the lungs every 6 (six) hours as needed for wheezing or shortness of breath.    Marland Kitchen amiodarone (PACERONE) 200 MG tablet Take 1 tablet (200 mg total) by mouth daily. 30 tablet 6  . amoxicillin (AMOXIL) 500 MG capsule Take 4 capsules (2,000 mg total) by mouth as needed (Take 2g (4 pills) 30 - 60 minutes before any dental procedure or cleaning). 12 capsule 3  . aspirin EC 325 MG EC tablet Take 1 tablet (325 mg total) by mouth daily. 30 tablet 6  . atorvastatin (LIPITOR) 40 MG tablet Take 0.5 tablets (20 mg total) by mouth at bedtime. 30 tablet  6  . bisacodyl (DULCOLAX) 5 MG EC tablet Take 1 tablet (5 mg total) by mouth daily as needed for moderate constipation. 30 tablet 0  . docusate sodium (COLACE) 100 MG capsule Take 1 capsule (100 mg total) by mouth 2 (two) times daily. 10 capsule 0  . furosemide (LASIX) 40 MG tablet Take 0.5 tablets (20 mg total) by mouth as needed. 30 tablet   . glipiZIDE (GLUCOTROL) 5 MG tablet Take 1 tablet (5 mg total) by mouth every evening. 30 tablet 6  . hydrALAZINE (APRESOLINE) 25 MG tablet Take 1.5 tablets (37.5 mg total) by mouth every 8 (eight) hours. 90 tablet 6  . insulin glargine (LANTUS) 100 UNIT/ML  injection Inject 0.05 mLs (5 Units total) into the skin at bedtime. 10 mL 11  . isosorbide mononitrate (IMDUR) 30 MG 24 hr tablet Take 1 tablet (30 mg total) by mouth daily. 30 tablet 6  . Magnesium 400 MG TABS Take 1 tablet by mouth daily with breakfast. 90 tablet 3  . pantoprazole (PROTONIX) 40 MG tablet Take 1 tablet (40 mg total) by mouth daily. 30 tablet 6  . spironolactone (ALDACTONE) 25 MG tablet Take 1 tablet (25 mg total) by mouth daily. 90 tablet 3  . traMADol (ULTRAM) 50 MG tablet Take 1-2 tablets (50-100 mg total) by mouth every 6 (six) hours as needed for moderate pain. 30 tablet 0  . warfarin (COUMADIN) 5 MG tablet Take 1 1/2 tablets daily. 45 tablet 3   No current facility-administered medications for this encounter.    Review of patient's allergies indicates no known allergies.  REVIEW OF SYSTEMS: All systems negative except as listed in HPI, PMH and Problem list.   LVAD INTERROGATION:  HMII VAD Speed: 9000 Flow: 4.2 Power: 5.0 PI: 6.3 Alarms: Low Voltage/No External Power one time where he disconnected both lines  Events:Fixed speed: 9000 Low speed limit: 8400 Primary Controller: Replace back up battery in 26 months.  Back up controller: Replace back up battery in 26 months.    I reviewed the LVAD parameters from today, and compared the results to the patient's prior recorded data.  No programming changes were made.  The LVAD is functioning within specified parameters.  The patient performs LVAD self-test daily.  LVAD interrogation was negative for any significant power changes, alarms or PI events/speed drops.  LVAD equipment check completed and is in good working order.  Back-up equipment present.   LVAD education done on emergency procedures and precautions and reviewed exit site care.    Filed Vitals:   11/20/14 1201  Pulse: 102  Weight: 157 lb 8 oz (71.442 kg)  SpO2: 98%   MAP 90 Physical Exam: GENERAL: Well appearing, male who presents to clinic  today in no acute distress with his wife.  HEENT: normal  NECK: Supple, JVP flat  .  2+ bilaterally, no bruits.  No lymphadenopathy or thyromegaly appreciated.   CARDIAC:  Mechanical heart sounds with LVAD hum present.  LUNGS:  Clear to auscultation bilaterally.  ABDOMEN:  Soft, round, nontender, positive bowel sounds x4.     LVAD exit site: well-healed and incorporated.  Dressing dry and intact.  No erythema or drainage.  Stabilization device present and accurately applied.  Driveline dressing is being changed daily per sterile technique. EXTREMITIES:  Warm and dry, no cyanosis, clubbing, rash or edema  NEUROLOGIC:  Alert and oriented x 4.  Gait steady.  No aphasia.  No dysarthria.  Affect pleasant.       ASSESSMENT  AND PLAN:  1. Chronic systolic HF - Status post LVAD implantation for DT.  NYHA I-II Volume status looks low. Stop spiro   Continue current dose of hydralazine and Imdur VAD parameters carefully reviewed. Increased PI events noted. Likely because he is dry.  Check LDH, CMET, CBC,PT INR 2.  Anticoagulation management - INR goal 2.0-3.0.  Will evaluate INR value today and follow up with patient for necessary changes.  See anticoagulation flow sheet  3. H/o Non Hodgkins Lymphoma s/p therapy 4.  H/O CVA 2008  5. H/O VT/VF --St Jude ICD 2010  6. DMII 7. 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   Follow up next week then move to monthly.   CLEGG,AMY NP-C  12:18 PM  Patient seen and examined with Darrick Grinder, NP. We discussed all aspects of the encounter. I agree with the assessment and plan as stated above.   Overall doing well with VAD support. Looks dry. Will stop spiro. Otherwise continue current therapy. Reviewed VAD restrictions with him. VAD parameters reveal multiple PI events. Likely due to volume depletion. Arlyce Harman stopped.   Zyden Suman,MD 4:04 PM

## 2014-11-20 NOTE — Patient Instructions (Addendum)
1. Stop Spironolactone.  2. Make sure you drink at least 2 L of fluid. You are dehydrated today.  3. Try to push yourself to eat to keep your weight and strength up.  4. Dressing changes every 3 days.  5. Dr. Lovena Le will call you for a follow up appointment since we adjusted the pacemaker today.  6. We will call you with your lab results today.

## 2014-11-24 ENCOUNTER — Inpatient Hospital Stay (HOSPITAL_COMMUNITY): Payer: Medicare Other

## 2014-11-24 ENCOUNTER — Telehealth: Payer: Self-pay | Admitting: *Deleted

## 2014-11-24 ENCOUNTER — Emergency Department (HOSPITAL_COMMUNITY): Payer: Medicare Other

## 2014-11-24 ENCOUNTER — Inpatient Hospital Stay (HOSPITAL_COMMUNITY)
Admission: EM | Admit: 2014-11-24 | Discharge: 2014-11-28 | DRG: 065 | Disposition: A | Discharge status: Home Health | Payer: Medicare Other | Attending: Cardiology | Admitting: Cardiology

## 2014-11-24 ENCOUNTER — Telehealth (HOSPITAL_COMMUNITY): Payer: Self-pay | Admitting: *Deleted

## 2014-11-24 ENCOUNTER — Encounter (HOSPITAL_COMMUNITY): Payer: Self-pay | Admitting: Emergency Medicine

## 2014-11-24 ENCOUNTER — Other Ambulatory Visit (HOSPITAL_COMMUNITY): Payer: Self-pay

## 2014-11-24 DIAGNOSIS — I611 Nontraumatic intracerebral hemorrhage in hemisphere, cortical: Principal | ICD-10-CM | POA: Diagnosis present

## 2014-11-24 DIAGNOSIS — E785 Hyperlipidemia, unspecified: Secondary | ICD-10-CM | POA: Diagnosis present

## 2014-11-24 DIAGNOSIS — Z9114 Patient's other noncompliance with medication regimen: Secondary | ICD-10-CM | POA: Diagnosis present

## 2014-11-24 DIAGNOSIS — Z7901 Long term (current) use of anticoagulants: Secondary | ICD-10-CM | POA: Diagnosis not present

## 2014-11-24 DIAGNOSIS — I471 Supraventricular tachycardia: Secondary | ICD-10-CM | POA: Diagnosis present

## 2014-11-24 DIAGNOSIS — Z95810 Presence of automatic (implantable) cardiac defibrillator: Secondary | ICD-10-CM | POA: Diagnosis not present

## 2014-11-24 DIAGNOSIS — Z9221 Personal history of antineoplastic chemotherapy: Secondary | ICD-10-CM

## 2014-11-24 DIAGNOSIS — Z7982 Long term (current) use of aspirin: Secondary | ICD-10-CM

## 2014-11-24 DIAGNOSIS — Z86718 Personal history of other venous thrombosis and embolism: Secondary | ICD-10-CM | POA: Diagnosis not present

## 2014-11-24 DIAGNOSIS — I609 Nontraumatic subarachnoid hemorrhage, unspecified: Secondary | ICD-10-CM | POA: Diagnosis present

## 2014-11-24 DIAGNOSIS — Z79899 Other long term (current) drug therapy: Secondary | ICD-10-CM | POA: Diagnosis not present

## 2014-11-24 DIAGNOSIS — I69951 Hemiplegia and hemiparesis following unspecified cerebrovascular disease affecting right dominant side: Secondary | ICD-10-CM

## 2014-11-24 DIAGNOSIS — I619 Nontraumatic intracerebral hemorrhage, unspecified: Secondary | ICD-10-CM | POA: Diagnosis present

## 2014-11-24 DIAGNOSIS — I10 Essential (primary) hypertension: Secondary | ICD-10-CM | POA: Diagnosis present

## 2014-11-24 DIAGNOSIS — I610 Nontraumatic intracerebral hemorrhage in hemisphere, subcortical: Secondary | ICD-10-CM

## 2014-11-24 DIAGNOSIS — Z794 Long term (current) use of insulin: Secondary | ICD-10-CM

## 2014-11-24 DIAGNOSIS — Z8572 Personal history of non-Hodgkin lymphomas: Secondary | ICD-10-CM

## 2014-11-24 DIAGNOSIS — Z9049 Acquired absence of other specified parts of digestive tract: Secondary | ICD-10-CM | POA: Diagnosis present

## 2014-11-24 DIAGNOSIS — Z95811 Presence of heart assist device: Secondary | ICD-10-CM

## 2014-11-24 DIAGNOSIS — I428 Other cardiomyopathies: Secondary | ICD-10-CM | POA: Diagnosis present

## 2014-11-24 DIAGNOSIS — E119 Type 2 diabetes mellitus without complications: Secondary | ICD-10-CM | POA: Diagnosis present

## 2014-11-24 DIAGNOSIS — I5022 Chronic systolic (congestive) heart failure: Secondary | ICD-10-CM | POA: Diagnosis present

## 2014-11-24 DIAGNOSIS — F329 Major depressive disorder, single episode, unspecified: Secondary | ICD-10-CM | POA: Diagnosis present

## 2014-11-24 LAB — BASIC METABOLIC PANEL
Anion gap: 8 (ref 5–15)
BUN: 18 mg/dL (ref 6–20)
CHLORIDE: 103 mmol/L (ref 101–111)
CO2: 24 mmol/L (ref 22–32)
Calcium: 9.2 mg/dL (ref 8.9–10.3)
Creatinine, Ser: 1.11 mg/dL (ref 0.61–1.24)
GFR calc Af Amer: >60 mL/min (ref >60)
GFR calc non Af Amer: >60 mL/min (ref >60)
GLUCOSE: 99 mg/dL (ref 65–99)
POTASSIUM: 4.1 mmol/L (ref 3.5–5.1)
Sodium: 135 mmol/L (ref 135–145)

## 2014-11-24 LAB — CBC WITH DIFFERENTIAL/PLATELET
BASOS ABS: 0 10*3/uL (ref 0.0–0.1)
BASOS PCT: 1 % (ref 0–1)
EOS ABS: 0.1 10*3/uL (ref 0.0–0.7)
EOS PCT: 2 % (ref 0–5)
HEMATOCRIT: 31.4 % — AB (ref 39.0–52.0)
HEMOGLOBIN: 10.2 g/dL — AB (ref 13.0–17.0)
Lymphocytes Relative: 17 % (ref 12–46)
Lymphs Abs: 1.4 10*3/uL (ref 0.7–4.0)
MCH: 28.4 pg (ref 26.0–34.0)
MCHC: 32.5 g/dL (ref 30.0–36.0)
MCV: 87.5 fL (ref 78.0–100.0)
MONOS PCT: 10 % (ref 3–12)
Monocytes Absolute: 0.8 10*3/uL (ref 0.1–1.0)
Neutro Abs: 5.6 10*3/uL (ref 1.7–7.7)
Neutrophils Relative %: 70 % (ref 43–77)
Platelets: 261 10*3/uL (ref 150–400)
RBC: 3.59 MIL/uL — ABNORMAL LOW (ref 4.22–5.81)
RDW: 15 % (ref 11.5–15.5)
WBC: 7.9 10*3/uL (ref 4.0–10.5)

## 2014-11-24 LAB — SEDIMENTATION RATE: Sed Rate: 45 mm/hr — ABNORMAL HIGH (ref 0–16)

## 2014-11-24 LAB — PROTIME-INR
INR: 3.06 — ABNORMAL HIGH (ref 0.00–1.49)
INR: 1.67 — ABNORMAL HIGH (ref 0.00–1.49)
PROTHROMBIN TIME: 31.9 s — AB (ref 11.6–15.2)
PROTHROMBIN TIME: 19.8 s — AB (ref 11.6–15.2)

## 2014-11-24 LAB — LACTATE DEHYDROGENASE: LDH: 341 U/L — AB (ref 98–192)

## 2014-11-24 LAB — MRSA PCR SCREENING: MRSA by PCR: NEGATIVE

## 2014-11-24 MED ORDER — ACETAMINOPHEN 325 MG PO TABS: 325.0000 mg | ORAL_TABLET | Freq: Four times a day (QID) | ORAL | Status: DC | PRN

## 2014-11-24 MED ORDER — ATORVASTATIN CALCIUM 20 MG PO TABS
20.0000 mg | ORAL_TABLET | Freq: Every day | ORAL | Status: DC
  Administered 2014-11-24 – 2014-11-27 (×3): 20 mg via ORAL
  Filled 2014-11-24 (×5): qty 1

## 2014-11-24 MED ORDER — HYDRALAZINE HCL 25 MG PO TABS
37.5000 mg | ORAL_TABLET | Freq: Three times a day (TID) | ORAL | Status: DC
  Administered 2014-11-25 – 2014-11-27 (×6): 37.5 mg via ORAL
  Filled 2014-11-24 (×10): qty 1.5

## 2014-11-24 MED ORDER — BISACODYL 5 MG PO TBEC: 5.0000 mg | DELAYED_RELEASE_TABLET | Freq: Every day | ORAL | Status: DC | PRN

## 2014-11-24 MED ORDER — SODIUM CHLORIDE 0.9 % IV SOLN: 10.0000 mL/h | Freq: Once | INTRAVENOUS | Status: DC

## 2014-11-24 MED ORDER — TRAMADOL HCL 50 MG PO TABS
50.0000 mg | ORAL_TABLET | Freq: Four times a day (QID) | ORAL | Status: DC | PRN
  Administered 2014-11-25: 100 mg via ORAL
  Filled 2014-11-24: qty 2

## 2014-11-24 MED ORDER — HYDRALAZINE HCL 25 MG PO TABS: 37.5000 mg | ORAL_TABLET | Freq: Three times a day (TID) | ORAL | Status: DC

## 2014-11-24 MED ORDER — ALBUTEROL SULFATE (2.5 MG/3ML) 0.083% IN NEBU: 3.0000 mL | INHALATION_SOLUTION | Freq: Four times a day (QID) | RESPIRATORY_TRACT | Status: DC | PRN

## 2014-11-24 MED ORDER — ISOSORBIDE MONONITRATE ER 30 MG PO TB24
30.0000 mg | ORAL_TABLET | Freq: Every day | ORAL | Status: DC
  Administered 2014-11-25 – 2014-11-28 (×4): 30 mg via ORAL
  Filled 2014-11-24 (×4): qty 1

## 2014-11-24 MED ORDER — VITAMIN K1 10 MG/ML IJ SOLN
5.0000 mg | Freq: Once | INTRAVENOUS | Status: AC
  Administered 2014-11-24: 5 mg via INTRAVENOUS
  Filled 2014-11-24: qty 0.5

## 2014-11-24 MED ORDER — IOHEXOL 350 MG/ML SOLN: 80.0000 mL | Freq: Once | INTRAVENOUS | Status: AC | PRN

## 2014-11-24 MED ORDER — FENTANYL CITRATE (PF) 100 MCG/2ML IJ SOLN
100.0000 ug | Freq: Once | INTRAMUSCULAR | Status: AC
  Administered 2014-11-24: 100 ug via INTRAVENOUS
  Filled 2014-11-24: qty 2

## 2014-11-24 MED ORDER — DOCUSATE SODIUM 100 MG PO CAPS
100.0000 mg | ORAL_CAPSULE | Freq: Two times a day (BID) | ORAL | Status: DC
  Administered 2014-11-24 – 2014-11-28 (×7): 100 mg via ORAL
  Filled 2014-11-24 (×8): qty 1

## 2014-11-24 MED ORDER — AMIODARONE HCL 200 MG PO TABS
200.0000 mg | ORAL_TABLET | Freq: Every day | ORAL | Status: DC
  Administered 2014-11-25 – 2014-11-28 (×4): 200 mg via ORAL
  Filled 2014-11-24 (×4): qty 1

## 2014-11-24 MED ORDER — PANTOPRAZOLE SODIUM 40 MG PO TBEC
40.0000 mg | DELAYED_RELEASE_TABLET | Freq: Every day | ORAL | Status: DC
  Administered 2014-11-25 – 2014-11-28 (×4): 40 mg via ORAL
  Filled 2014-11-24 (×4): qty 1

## 2014-11-24 NOTE — ED Provider Notes (Signed)
CSN: [id number]     Arrival date & time 11/24/14  1256 History   First MD Initiated Contact with Patient 11/24/14 1315     Chief Complaint  Patient presents with  . Headache     (Consider location/radiation/quality/duration/timing/severity/associated sxs/prior Treatment) HPI  54 year old male presents with an acute right-sided headache that started last night around 9 PM. Severe on onset but has gradually worsened throughout the night. Patient is an LVAD patient and is currently on Coumadin. He has a chronic history of right-sided weakness from a prior stroke, no new weakness appreciated. Pain is worst over his right temple but is also over his scalp and his neck has felt a little tense and stiff on the right side. Patient rates his pain as a 7 out of 10. It is sharp. No blurry vision or eye pain. Pain radiates from his head into his neck. Took a Tylenol and then a tramadol with no relief.  Past Medical History  Diagnosis Date  . Paroxysmal ventricular tachycardia 2005, 2010    s/p AICD '05, replaced w/ St. Jude's in 2010 (PVT/V.Fib arrest requiring ICD implant in 2005)  . HYPERTENSION, UNSPECIFIED   . HYPERLIPIDEMIA-MIXED   . 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 Nonischemic cardiomyopathy     2/2 Adriamycin administration for lymphoma  . CHF (congestive heart failure)     EF 15% by echo 2009; s/p St. Jude ICD  . Diabetes mellitus     Type 2  . Cancer 1992, 2007     non hodkins lymphoma 1992, Hodgkins 2007  . Depression   . LV (left ventricular) mural thrombus 2009    2009, on coumadin  . Small bowel obstruction 2001    s/p Small bowel resection 2001  . Jejunal intussusception 2008    2008  . 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 Splenic mass 2009    Noted on Abd [place] 2009 w/ recs for f/u CT - not done  . Hypotension   .  Noncompliance with medication regimen   . AICD (automatic cardioverter/defibrillator) present 2005, 2010  . Presence of permanent cardiac pacemaker    Past Surgical History  Procedure Laterality Date  . Cardiac defibrillator placement      2005, replaced w/ St. Jude's 2010  . Vasectomy    . Bowel resection      small bowell 2/2 obstruction 2001  . Pacemaker insertion    . Insert / replace / remove pacemaker    . Colonoscopy N/A 09/18/2014    Procedure: COLONOSCOPY;  Surgeon: [name], MD;  Location: [facility] ENDOSCOPY;  Service: Endoscopy;  Laterality: N/A;  . Esophagogastroduodenoscopy N/A 09/18/2014    Procedure: ESOPHAGOGASTRODUODENOSCOPY (EGD);  Surgeon: [name], MD;  Location[facility] ENDOSCOPY;  Service: Endoscopy;  Laterality: N/A;  . Left and right heart catheterization with coronary angiogram N/A 09/19/2014    Procedure: LEFT AND RIGHT HEART CATHETERIZATION WITH CORONARY ANGIOGRAM;  Surgeon: Jolaine Artist, MD;  Location: [facility] CATH LAB;  Service: Cardiovascular;  Laterality: N/A;  . Colonoscopy N/A 09/19/2014    Procedure: COLONOSCOPY;  Surgeon: [name], MD;  Location: [place];  Service: Gastroenterology;  Laterality: N/A;  . Insertion of implantable left ventricular assist device N/A 10/13/2014    Procedure: INSERTION OF IMPLANTABLE LEFT VENTRICULAR ASSIST DEVICE;  Surgeon: [name],  MD;  Location: [facility] OR;  Service: Open Heart Surgery;  Laterality: N/A;  CIRC ARREST  NITRIC OXIDE  . Tee without cardioversion N/A 10/13/2014    Procedure: TRANSESOPHAGEAL ECHOCARDIOGRAM (TEE);  Surgeon: [name], MD;  Location: [place];  Service: Open Heart Surgery;  Laterality: N/A;  . Exploration post operative open heart N/A 10/13/2014    Procedure: EXPLORATION POST OPERATIVE OPEN HEART;  Surgeon: [name], MD;  Location: [place] OR;  Service: Open Heart Surgery;  Laterality: N/A;   Family History  Problem Relation Age of Onset  . Other      No known family h/o heart disease  .  Heart disease    . Diabetes Mother   . Other Other     complications from hip replacement   History  Substance Use Topics  . Smoking status: Never Smoker   . Smokeless tobacco: Never Used  . Alcohol Use: 1.2 oz/week    1 Cans of beer, 1 Shots of liquor per week     Comment: ON SPECIAL OCCASIONS    Review of Systems  Constitutional: Negative for fever.  Eyes: Negative for photophobia, pain and visual disturbance.  Cardiovascular: Negative for chest pain.  Gastrointestinal: Negative for nausea and vomiting.  Neurological: Positive for weakness (chronic right sided weakness, no new symptoms) and headaches. Negative for numbness.  All other systems reviewed and are negative.     Allergies  Review of patient's allergies indicates no known allergies.  Home Medications   Prior to Admission medications   Medication Sig Start Date End Date Taking? Authorizing Provider  albuterol (PROVENTIL HFA;VENTOLIN HFA) 108 (90 BASE) MCG/ACT inhaler Inhale 1-2 puffs into the lungs every 6 (six) hours as needed for wheezing or shortness of breath.    Historical Provider, MD  amiodarone (PACERONE) 200 MG tablet Take 1 tablet (200 mg total) by mouth daily. 10/24/14   Amy D Ninfa Meeker, NP  amoxicillin (AMOXIL) 500 MG capsule Take 4 capsules (2,000 mg total) by mouth as needed (Take 2g (4 pills) 30 - 60 minutes before any dental procedure or cleaning). 10/30/14   Amy D Ninfa Meeker, NP  aspirin EC 325 MG EC tablet Take 1 tablet (325 mg total) by mouth daily. 10/24/14   Amy D Ninfa Meeker, NP  atorvastatin (LIPITOR) 40 MG tablet Take 0.5 tablets (20 mg total) by mouth at bedtime. 09/04/14   Larey Dresser, MD  bisacodyl (DULCOLAX) 5 MG EC tablet Take 1 tablet (5 mg total) by mouth daily as needed for moderate constipation. 09/04/14   Larey Dresser, MD  docusate sodium (COLACE) 100 MG capsule Take 1 capsule (100 mg total) by mouth 2 (two) times daily. 09/04/14   Larey Dresser, MD  furosemide (LASIX) 40 MG tablet Take 0.5  tablets (20 mg total) by mouth as needed. 10/24/14   Amy D Clegg, NP  glipiZIDE (GLUCOTROL) 5 MG tablet Take 1 tablet (5 mg total) by mouth every evening. 10/24/14   Amy D Ninfa Meeker, NP  hydrALAZINE (APRESOLINE) 25 MG tablet Take 1.5 tablets (37.5 mg total) by mouth every 8 (eight) hours. 10/24/14   Amy D Clegg, NP  insulin glargine (LANTUS) 100 UNIT/ML injection Inject 0.05 mLs (5 Units total) into the skin at bedtime. 10/24/14   Amy D Ninfa Meeker, NP  isosorbide mononitrate (IMDUR) 30 MG 24 hr tablet Take 1 tablet (30 mg total) by mouth daily. 10/24/14   Amy D Ninfa Meeker, NP  Magnesium 400 MG TABS Take 1 tablet by mouth daily  with breakfast. 10/30/14   Amy D Clegg, NP  pantoprazole (PROTONIX) 40 MG tablet Take 1 tablet (40 mg total) by mouth daily. 10/24/14   Amy D Ninfa Meeker, NP  traMADol (ULTRAM) 50 MG tablet Take 1-2 tablets (50-100 mg total) by mouth every 6 (six) hours as needed for moderate pain. 10/24/14   Amy D Ninfa Meeker, NP  warfarin (COUMADIN) 5 MG tablet Take 1 1/2 tablets daily. 10/30/14   Amy D Clegg, NP   Pulse 78  Temp(Src) 97.9 F (36.6 C) (Oral)  Ht _0  (1.702 m)  Wt 148 lb 4 oz (67.246 kg)  BMI 23.21 kg/m2  SpO2 100% Physical Exam  Constitutional: He is oriented to person, place, and time. He appears well-developed and well-nourished.  HENT:  Head: Normocephalic and atraumatic.  Right Ear: External ear normal.  Left Ear: External ear normal.  Nose: Nose normal.  Tenderness over right temple but also over right scalp  Eyes: EOM are normal. Pupils are equal, round, and reactive to light. Right eye exhibits no discharge. Left eye exhibits no discharge.  Neck: Normal range of motion. Neck supple. Muscular tenderness present. No spinous process tenderness present. No rigidity.    Cardiovascular: Normal rate, regular rhythm, normal heart sounds and intact distal pulses.   Pulmonary/Chest: Effort normal.  Abdominal: Soft. There is no tenderness.  Musculoskeletal: He exhibits no edema.  Neurological:  He is alert and oriented to person, place, and time.  4/5 strength in RUE, RLE. 5/5 strength in LUE, LLE CN 2-12 grossly intact.  Skin: Skin is warm and dry. He is not diaphoretic.  Nursing note and vitals reviewed.   ED Course  Procedures (including critical care time) Labs Review Labs Reviewed  LACTATE DEHYDROGENASE - Abnormal; Notable for the following:    LDH 341 (*)    All other components within normal limits  CBC WITH DIFFERENTIAL/PLATELET - Abnormal; Notable for the following:    RBC 3.59 (*)    Hemoglobin 10.2 (*)    HCT 31.4 (*)    All other components within normal limits  SEDIMENTATION RATE - Abnormal; Notable for the following:    Sed Rate 45 (*)    All other components within normal limits  PROTIME-INR - Abnormal; Notable for the following:    Prothrombin Time 31.9 (*)    INR 3.06 (*)    All other components within normal limits  BASIC METABOLIC PANEL    Imaging Review No results found.   EKG Interpretation None      MDM   Final diagnoses:  None    Patient with acute and gradually worsening headache since last night. Neuro exam is at his normal baseline status post a stroke a few years ago. Patient does have tenderness over the right temple but no eye symptoms and only a mild ESR elevation. He also has significant neck pain as well as scalp pain. With no ocular symptoms, no jaw claudication, etc., have low suspicion this is temporal arteritis. Given the acute onset of his headache, CT and CT angio will be obtained. He is unable to go lumbar puncture due to his coagulopathy from Coumadin. However I do have lower suspicion given no neck stiffness no acute neurologic dysfunction. His pain is better after treatment in the ER. Care transferred to Dr. Ralene Bathe with CT/CTA pending.    Sherwood Gambler, MD 11/24/14 1539

## 2014-11-24 NOTE — Consult Note (Signed)
Reason for Consult: Right temporal intracerebral hemorrhage Referring Physician: Dr. Conrad [address] OTHON Tran is an 54 y.o. male.  HPI: Andre Tran is a 54 year old black male with a significant cardiac history. Most recently, i.e. on 10/13/2014 he had a left ventricular assist device placed. He is chronically anticoagulated. The patient was doing well until yesterday at approximately 2100 when he began having a headache. There is no history of trauma. The patient's headache worsened and he was brought to Andre Tran emerged department by his wife. He was worked up with a head CT which demonstrated a right temporal intracerebral hemorrhage with subarachnoid hemorrhage. The patient has been admitted by Dr. Algernon Huxley on the cardiology. A neurosurgical consultation has been requested.  Presently the patient is accompanied by his wife. He is alert and pleasant. He is in no apparent distress. He says his headache is better. He denies seizures, nausea, vomiting, trauma, numbness, tingling, weakness, etc.  Past Medical History  Diagnosis Date  . Paroxysmal ventricular tachycardia 2005, 2010    s/p AICD '05, replaced w/ St. Jude's in 2010 (PVT/V.Fib arrest requiring ICD implant in 2005)  . HYPERTENSION, UNSPECIFIED   . HYPERLIPIDEMIA-MIXED   . 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 Nonischemic cardiomyopathy     2/2 Adriamycin administration for lymphoma  . CHF (congestive heart failure)     EF 15% by echo 2009; s/p St. Jude ICD  . Diabetes mellitus     Type 2  . Cancer 1992, 2007     non hodkins lymphoma 1992, Hodgkins 2007  . Depression   . LV (left ventricular) mural thrombus 2009    2009, on coumadin  . Small bowel obstruction 2001    s/p Small bowel resection 2001  . Jejunal intussusception 2008    2008  . 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  Splenic mass 2009    Noted on Abd [place] 2009 w/ recs for f/u CT - not done  . Hypotension   . Noncompliance with medication regimen   . AICD (automatic cardioverter/defibrillator) present 2005, 2010  . Presence of permanent cardiac pacemaker     Past Surgical History  Procedure Laterality Date  . Cardiac defibrillator placement      2005, replaced w/ St. Jude's 2010  . Vasectomy    . Bowel resection      small bowell 2/2 obstruction 2001  . Pacemaker insertion    . Insert / replace / remove pacemaker    . Colonoscopy N/A 09/18/2014    Procedure: COLONOSCOPY;  Surgeon: [name], MD;  Location: [facility] ENDOSCOPY;  Service: Endoscopy;  Laterality: N/A;  . Esophagogastroduodenoscopy N/A 09/18/2014    Procedure: ESOPHAGOGASTRODUODENOSCOPY (EGD);  Surgeon: [name], MD;  Location: [facility] ENDOSCOPY;  Service: Endoscopy;  Laterality: N/A;  . Left and right heart catheterization with coronary angiogram N/A 09/19/2014    Procedure: LEFT AND RIGHT HEART CATHETERIZATION WITH CORONARY ANGIOGRAM;  Surgeon: Jolaine Artist, MD;  Location: [facility] CATH LAB;  Service: Cardiovascular;  Laterality: N/A;  . Colonoscopy N/A 09/19/2014    Procedure: COLONOSCOPY;  Surgeon: [name], MD;  Location: [place];  Service: Gastroenterology;  Laterality: N/A;  . Insertion of implantable left ventricular assist device N/A 10/13/2014    Procedure: INSERTION OF IMPLANTABLE LEFT VENTRICULAR ASSIST DEVICE;  Surgeon: [name], MD;  Location: [facility] OR;  Service: Open Heart Surgery;  Laterality: N/A;  CIRC ARREST  NITRIC OXIDE  . Tee without cardioversion N/A 10/13/2014    Procedure: TRANSESOPHAGEAL ECHOCARDIOGRAM (TEE);  Surgeon: [name], MD;  Location: [place];  Service: Open Heart Surgery;  Laterality: N/A;  . Exploration post operative open heart N/A 10/13/2014    Procedure: EXPLORATION POST OPERATIVE OPEN HEART;  Surgeon: [name], MD;  Location: [place] OR;  Service: Open Heart Surgery;  Laterality: N/A;     Family History  Problem Relation Age of Onset  . Other      No known family h/o heart disease  . Heart disease    . Diabetes Mother   . Other Other     complications from hip replacement    Social History:  reports that he has never smoked. He has never used smokeless tobacco. He reports that he drinks about 1.2 oz of alcohol per week. He reports that he does not use illicit drugs.  Allergies: No Known Allergies  Medications:  I have reviewed the patient's current medications. Prior to Admission:  (Not in a hospital admission) Scheduled:  Continuous: . sodium chloride    . phytonadione (VITAMIN K) IV 5 mg (11/24/14 1826)   IRJ:JOACZYS Anti-infectives    None       Results for orders placed or performed during the hospital encounter of 11/24/14 (from the past 48 hour(s))  Lactate dehydrogenase     Status: Abnormal   Collection Time: 11/24/14  1:46 PM  Result Value Ref Range   LDH 341 (H) 98 - 192 U/L  Basic metabolic panel     Status: None   Collection Time: 11/24/14  1:46 PM  Result Value Ref Range   Sodium 135 135 - 145 mmol/L   Potassium 4.1 3.5 - 5.1 mmol/L   Chloride 103 101 - 111 mmol/L   CO2 24 22 - 32 mmol/L   Glucose, Bld 99 65 - 99 mg/dL   BUN 18 6 - 20 mg/dL   Creatinine, Ser 1.11 0.61 - 1.24 mg/dL   Calcium 9.2 8.9 - 10.3 mg/dL   GFR calc non Af Amer >60 >60 mL/min   GFR calc Af Amer >60 >60 mL/min    Comment: (NOTE) The eGFR has been calculated using the CKD EPI equation. This calculation has not been validated in all clinical situations. eGFR's persistently <60 mL/min signify possible Chronic Kidney Disease.    Anion gap 8 5 - 15  CBC with Differential     Status: Abnormal   Collection Time: 11/24/14  1:46 PM  Result Value Ref Range   WBC 7.9 4.0 - 10.5 K/uL   RBC 3.59 (L) 4.22 - 5.81 MIL/uL   Hemoglobin 10.2 (L) 13.0 - 17.0 g/dL   HCT 31.4 (L) 39.0 - 52.0 %   MCV 87.5 78.0 - 100.0 fL   MCH 28.4 26.0 - 34.0 pg   MCHC 32.5 30.0 - 36.0  g/dL   RDW 15.0 11.5 - 15.5 %   Platelets 261 150 - 400 K/uL   Neutrophils Relative % 70 43 - 77 %   Neutro Abs 5.6 1.7 - 7.7 K/uL   Lymphocytes Relative 17 12 - 46 %   Lymphs Abs 1.4 0.7 - 4.0 K/uL   Monocytes Relative 10 3 - 12 %   Monocytes Absolute 0.8 0.1 - 1.0 K/uL   Eosinophils Relative 2 0 - 5 %   Eosinophils Absolute 0.1 0.0 - 0.7 K/uL  Basophils Relative 1 0 - 1 %   Basophils Absolute 0.0 0.0 - 0.1 K/uL  Sedimentation rate     Status: Abnormal   Collection Time: 11/24/14  1:46 PM  Result Value Ref Range   Sed Rate 45 (H) 0 - 16 mm/hr  Protime-INR     Status: Abnormal   Collection Time: 11/24/14  1:46 PM  Result Value Ref Range   Prothrombin Time 31.9 (H) 11.6 - 15.2 seconds   INR 3.06 (H) 0.00 - 1.49  Prepare fresh frozen plasma     Status: None (Preliminary result)   Collection Time: 11/24/14  5:12 PM  Result Value Ref Range   Unit Number [id number]    Blood Component Type THAWED PLASMA    Unit division 00    Status of Unit ALLOCATED    Transfusion Status OK TO TRANSFUSE    Unit Number [id number]    Blood Component Type THAWED PLASMA    Unit division 00    Status of Unit ISSUED    Transfusion Status OK TO TRANSFUSE     Ct Angio Head W/cm &/or Wo Cm  11/24/2014   CLINICAL DATA:  54 year old male with right-sided headache since yesterday. Left ventricular assist device placement 11/19/2014. Initial encounter.  EXAM: CT ANGIOGRAPHY HEAD AND NECK  TECHNIQUE: Multidetector CT imaging of the head and neck was performed using the standard protocol during bolus administration of intravenous contrast. Multiplanar CT image reconstructions and MIPs were obtained to evaluate the vascular anatomy. Carotid stenosis measurements (when applicable) are obtained utilizing NASCET criteria, using the distal internal carotid diameter as the denominator.  CONTRAST:  80 cc Omnipaque 350.  COMPARISON:  None.  01/22/2013 neck CT. 08/15/2007 head CT. Catheter cerebral angiogram  04/18/2007.  FINDINGS: Brain: Right temporal lobe 2.8 cm hematoma with surrounding vasogenic edema and breakthrough into the subarachnoid space with subarachnoid blood right temporal-parietal region. 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  Calvarium and skull base: Negative.  Paranasal sinuses: Negative.  Orbits: Negative.  CTA HEAD  Anterior circulation: No aneurysm or vascular malformation seen at the level of the right temporal lobe hematoma. Moderate to marked narrowing M1 segment left middle cerebral artery with decreased caliber of left middle cerebral artery branch vessels. Calcified internal carotid artery cavernous segment with mild narrowing greater on the left.  Posterior circulation: Left vertebral artery is dominant. Narrowing of the right vertebral artery after takeoff of the posterior inferior cerebellar artery. Slight irregularity and narrowing of the basilar artery without high-grade stenosis.  Venous sinuses: Major dural sinuses are patent.  Anatomic variants: None.  Delayed phase:No significant abnormality.  CTA NECK  Aortic arch: 3 vessel aortic arch with close origin of the innominate and left common carotid artery.  Right carotid system: Mild plaque carotid bifurcation/proximal right internal carotid artery without significant narrowing.  Left carotid system: No significant narrowing.  Vertebral arteries:Mild to slightly moderate narrowing proximal left vertebral artery which is the dominant vertebral artery. Moderate narrowing proximal right vertebral artery.  Skeleton: Cervical spondylotic changes most notable C4-5.  Other neck: Left pleural effusion.  IMPRESSION: Right temporal lobe 2.8 cm hematoma with surrounding vasogenic edema and breakthrough into the subarachnoid space with subarachnoid blood right temporal-parietal region. No aneurysm or vascular malformation is detected at this level on the current exam. Given  location of hemorrhage, question if this hemorrhage may be related to thrombosis of the vein of Labbe.  Remote left basal ganglia/ corona radiata infarct  with encephalomalacia and dilated left lateral ventricle.  CTA HEAD  Moderate to marked narrowing M1 segment left middle cerebral artery with decreased caliber of left middle cerebral artery branch vessels.  Calcified internal carotid artery cavernous segment with mild narrowing greater on the left.  CTA NECK  No hemodynamically significant stenosis involving either carotid bifurcation.  Mild to slightly moderate narrowing proximal left vertebral artery which is the dominant vertebral artery. Moderate narrowing proximal right vertebral artery.  Please see above for further detail.  These results were called by telephone at the time of interpretation on 11/24/2014 at 4:45 pm to Dr. Ralene Bathe and Dr. Loralie Champagne, who verbally acknowledged these results.   Electronically Signed   By: Genia Del M.D.   On: 11/24/2014 17:14   Ct Angio Neck W/cm &/or Wo/cm  11/24/2014   CLINICAL DATA:  54 year old male with right-sided headache since yesterday. Left ventricular assist device placement 11/19/2014. Initial encounter.  EXAM: CT ANGIOGRAPHY HEAD AND NECK  TECHNIQUE: Multidetector CT imaging of the head and neck was performed using the standard protocol during bolus administration of intravenous contrast. Multiplanar CT image reconstructions and MIPs were obtained to evaluate the vascular anatomy. Carotid stenosis measurements (when applicable) are obtained utilizing NASCET criteria, using the distal internal carotid diameter as the denominator.  CONTRAST:  80 cc Omnipaque 350.  COMPARISON:  None.  01/22/2013 neck CT. 08/15/2007 head CT. Catheter cerebral angiogram 04/18/2007.  FINDINGS: Brain: Right temporal lobe 2.8 cm hematoma with surrounding vasogenic edema and breakthrough into the subarachnoid space with subarachnoid blood right temporal-parietal region. Remote left  basal ganglia/ corona radiata infarct with encephalomalacia and dilated left lateral ventricle.  Calvarium and skull base: Negative.  Paranasal sinuses: Negative.  Orbits: Negative.  CTA HEAD  Anterior circulation: No aneurysm or vascular malformation seen at the level of the right temporal lobe hematoma. Moderate to marked narrowing M1 segment left middle cerebral artery with decreased caliber of left middle cerebral artery branch vessels. Calcified internal carotid artery cavernous segment with mild narrowing greater on the left.  Posterior circulation: Left vertebral artery is dominant. Narrowing of the right vertebral artery after takeoff of the posterior inferior cerebellar artery. Slight irregularity and narrowing of the basilar artery without high-grade stenosis.  Venous sinuses: Major dural sinuses are patent.  Anatomic variants: None.  Delayed phase:No significant abnormality.  CTA NECK  Aortic arch: 3 vessel aortic arch with close origin of the innominate and left common carotid artery.  Right carotid system: Mild plaque carotid bifurcation/proximal right internal carotid artery without significant narrowing.  Left carotid system: No significant narrowing.  Vertebral arteries:Mild to slightly moderate narrowing proximal left vertebral artery which is the dominant vertebral artery. Moderate narrowing proximal right vertebral artery.  Skeleton: Cervical spondylotic changes most notable C4-5.  Other neck: Left pleural effusion.  IMPRESSION: Right temporal lobe 2.8 cm hematoma with surrounding vasogenic edema and breakthrough into the subarachnoid space with subarachnoid blood right temporal-parietal region. No aneurysm or vascular malformation is detected at this level on the current exam. Given location of hemorrhage, question if this hemorrhage may be related to thrombosis of the vein of Labbe.  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  CTA  HEAD  Moderate to marked narrowing M1 segment left middle cerebral artery with decreased caliber of left middle cerebral artery branch vessels.  Calcified internal carotid artery cavernous segment with mild narrowing greater on the left.  CTA NECK  No hemodynamically significant stenosis involving  either carotid bifurcation.  Mild to slightly moderate narrowing proximal left vertebral artery which is the dominant vertebral artery. Moderate narrowing proximal right vertebral artery.  Please see above for further detail.  These results were called by telephone at the time of interpretation on 11/24/2014 at 4:45 pm to Dr. Ralene Bathe and Dr. Loralie Champagne, who verbally acknowledged these results.   Electronically Signed   By: Genia Del M.D.   On: 11/24/2014 17:14   Dg Chest Port 1 View  11/24/2014   CLINICAL DATA:  Headache for 1 day, history hypertension, diabetes, CHF, paroxysmal atrial fibrillation, LEFT ventricular assist device  EXAM: PORTABLE CHEST - 1 VIEW  COMPARISON:  Portable exam 1727 hours compared to 10/23/2014  FINDINGS: Post median sternotomy with LEFT ventricular assist device again seen.  LEFT subclavian AICD leads project over RIGHT atrium and RIGHT ventricle.  Minimal enlargement of cardiac silhouette.  Mediastinal contours and pulmonary vascularity normal.  Lungs clear.  No pleural effusion or pneumothorax.  Osseous structures stable.  IMPRESSION: Post LVAD and AICD.  No acute abnormalities.   Electronically Signed   By: Lavonia Dana M.D.   On: 11/24/2014 17:51    ROS: As above Blood pressure 108/84, pulse 76, temperature 98.2 F (36.8 C), temperature source Oral, resp. rate 14, height $RemoveBe'5\' 7"'fnpPEDHnb$  (1.702 m), weight 67.246 kg (148 lb 4 oz), SpO2 100 %. Physical Exam  General: An alert and pleasant 54 year old black male in no apparent distress  HEENT: Normocephalic, atraumatic, pupils equal round reactive light, extraocular muscles are intact.  Neck: Supple without masses or deformities.  Spurling's testing is negative.  Thorax: Symmetric, his sternotomy incision is well-healed.  Abdomen: Soft  Extremities: Unremarkable  Neurologic exam the patient is alert and oriented 3. Glasgow Coma Scale 15. Cranial nerves II through XII were examined bilaterally and grossly normal. Vision and hearing are grossly normal bilaterally. Motor strength is 5 over 5 in his bilateral biceps, triceps, hand grip, quadriceps, gastrocnemius, dorsiflexors. Cerebellar exam is intact to rapid alternating movements of the upper extremities bilaterally. Sensory function is intact to light touch sensation all tested dermatomes bilaterally.  Imaging studies: I have reviewed the patient's head CT performed at [facility] today. The patient has a right temporal intracerebral hemorrhage with a small subarachnoid hemorrhage. There is mild mass effect.    Assessment/Plan: Right temporal intracerebral hemorrhage, subarachnoid hemorrhage, anticoagulation, etc. I have discussed the situation with the ER doctor. I have recommended that the patient's anticoagulation be reversed with vitamin K and fresh frozen plasma to decrease the chance of worsening bleeding. I will plan to repeat his CAT scan tomorrow. If it is unchanged then we may consider re-anticoagulate him. I have answered all the patient's, and his wife's, questions.  Cardiac issues: Noted  Ophelia Charter 11/24/2014, 6:53 PM

## 2014-11-24 NOTE — Progress Notes (Signed)
eLink Physician-Brief Progress Note Patient Name: HAEDEN HUDOCK DOB: 12-Jun-1961 MRN: [id number]   Date of Service  11/24/2014  HPI/Events of Note  54 yo with PMH Hodgekin's Lymphoma, DM and HTN. LVAD placed 10/29/2014 now on chronic warfarin Rx. Presents today with HA and ICH. Given FFP and Vitamin K. Management per neurosurgery and cardiology.   eICU Interventions  Continue current management.      Intervention Category Evaluation Type: New Patient Evaluation  Lysle Dingwall 11/24/2014, 8:42 PM

## 2014-11-24 NOTE — Telephone Encounter (Signed)
Wife called VAD clinic to report pt has worsening head pain and she is bringing pt to ED.  Called wife and she reports she and daughter "are trying to get him dressed". Informed wife if pt unable to dress himself, this is a big change from earlier neuro status and instructed her to call 911.  Pt then stood, started changing his clothes, and spoke saying he was "OK".  Still advised wife to call 911 for transport due to possible neurological changes associated with CVA.

## 2014-11-24 NOTE — Telephone Encounter (Signed)
Wife called to report pt's "head hurts".  States head started hurting at @ 9pm last night. Denies N&V, no change in neuro status, pt walking, talking, alert and oriented x 3. Pt rates pain 6/10.  Pt tried tylenol with no relief.  Dr. Aundra Dubin updated - asked wife to try Tramadol 50 mg (pt has supply at home) with food. If no improvement, asked wife to call for planned CT head tomorrow. If pain worsens or neuro status changes, instructed wife to call VAD pager for ED admission. Wife verbalized understanding of above.

## 2014-11-24 NOTE — ED Notes (Signed)
Called CT.  They stated ct available in 15 min.

## 2014-11-24 NOTE — ED Notes (Addendum)
Dr Arnoldo Morale with neurosurgery at bedside.

## 2014-11-24 NOTE — Progress Notes (Addendum)
Pharmacy note: Hydralazine/Iohexol (given with CT angio today)  Hydralazine may predispose patients to severe skin reactions when receiving iodine-containing radiopaque agents. There are limited case reports available but reactions can include rash and skin ulceration. Spoke with Dr. Jules Husbands and will delay administration until the morning of 5/17 to minimize the risk of interaction.  Thank you, Hildred Laser, Pharm D 11/24/2014 9:20 PM

## 2014-11-24 NOTE — ED Notes (Signed)
CT called.  Cardiology RN paged.

## 2014-11-24 NOTE — ED Notes (Signed)
Pt c/o right sided HA starting last night; pt had recent LVAD placement 10/20/14

## 2014-11-24 NOTE — Progress Notes (Signed)
VAD coordinator paged to ED upon patient's arrival. Pt awake, alert, MOE x 4 (right side weaker than left; baseline from old CVA).   LVAD interrogation reveals:  Speed: 8990 Flow:  4.4 Power:  5.0 PI: 7.4 Alarms:  none Events: 32 PI events 5/14; 50 PI events on 5/14; 25 PI events 5/13; 46 PI events 11/20/14 Fixed speed:  9000 Low speed limit:  8400 Primary Controller:  Replace back up battery in 25 months. Back up controller:   Replace back up battery in 25 months.  Pt/wife deny any fever/chills; driveline exit site with no s&s of infection.  Pt states he has been drinking 2 liters per day; denies dizziness, lightheadedness, or syncope. Denies palpitations, SOB, edema, or chest pain. Reviewed home log, pt is down one pound over last week on home wts.  Doppler MAP:  96 Auto cuff:  103/76 (83) HR:  75 Temp:  97.9 O2 sat:  100% Home wt:  147 - 149 Telemetry:  NSR  VAD coordinator will accompany pt to CT scan for CT of head.  Dr. Aundra Dubin updated.   Accompanied pt to CT scan:   Auto cuff:  117/86 (92).  Pt reports pain relief with Fenrtanyl; drowsy.  Accompanied pt back to ED after CT scan of head/neck with contrast.  Pt denies further head/neck pain; awake and alert. Wife at bedside.  Dr. Aundra Dubin updated.  Radiologist to call report to ED physician when available.

## 2014-11-24 NOTE — ED Provider Notes (Signed)
Pt visit shared with Dr. Regenia Skeeter.  Pt with LVAD on chronic coumadin here for evaluation of headache.  CT scan with ICH.  Discussed with Dr. Aundra Dubin with Cardiology who will admit the patient.  Discussed with Dr. Arnoldo Morale with Neurosurgery who will see the patient in consult - recommends reversing coumadin given spontaneous head bleed.  Discussed with patient findings of studies and need to reverse coumadin and admission.     [name], MD 11/25/14 [id number]

## 2014-11-24 NOTE — H&P (Addendum)
VAD TEAM History & Physical Note   Reason for Admission:    HPI:    Andre Tran is a 54 y.o. 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 Underwent HMII LVAD placement 10/13/2014   Admitted April 1st 2016 for schedule HMII implant. Post implant he was taken back to the OR for bleeding otherwise his post operative course was uneventful. LVAD speed turned down to 9000 on the day of discharge. Discharge weight was 160 pounds.   Last evaluated in the LVAD clininc 5/12. At that time he was stable and nuerologically intact able to perform all ADLs.  Today his wife called the LVAD pager regarding headache. She was insutructed to bring him to the ED for further evaluation. She reported difficulty getting him dressed and that he was unable to stand and assist with dressing.   LVAD INTERROGATION:  HeartMate II LVAD:  Flow 4.4 liters/min, speed 8990, power 5.0, PI 7.4     Review of Systems: [y] = yes, [ ]  = no   General: Weight gain [ ] ; Weight loss [ ] ; Anorexia [ ] ; Fatigue [Y ]; Fever [ ] ; Chills [ ] ; Weakness [ ]   Cardiac: Chest pain/pressure [ ] ; Resting SOB [ ] ; Exertional SOB [ ] ; Orthopnea [ ] ; Pedal Edema [ ] ; Palpitations [ ] ; Syncope [ ] ; Presyncope [ ] ; Paroxysmal nocturnal dyspnea[ ]   Pulmonary: Cough [ ] ; Wheezing[ ] ; Hemoptysis[ ] ; Sputum [ ] ; Snoring [ ]   GI: Vomiting[ ] ; Dysphagia[ ] ; Melena[ ] ; Hematochezia [ ] ; Heartburn[ ] ; Abdominal pain [ ] ; Constipation [ ] ; Diarrhea [ ] ; BRBPR [ ]   GU: Hematuria[ ] ; Dysuria [ ] ; Nocturia[ ]   Vascular: Pain in legs with walking [ ] ; Pain in feet with lying flat [ ] ; Non-healing sores [ ] ; Stroke [ ] ; TIA [ ] ; Slurred  speech [ ] ;  Neuro: Headaches[Y ]; Vertigo[ ] ; Seizures[ ] ; Paresthesias[ ] ;Blurred vision [ ] ; Diplopia [ ] ; Vision changes [ ]   Ortho/Skin: Arthritis [ ] ; Joint pain [ ] ; Muscle pain [ ] ; Joint swelling [ ] ; Back Pain [ ] ; Rash [ ]   Psych: Depression[ ] ; Anxiety[ ]   Heme: Bleeding problems [ ] ; Clotting disorders [ ] ; Anemia [ ]   Endocrine: Diabetes [ ] ; Thyroid dysfunction[ ]   Home Medications Prior to Admission medications   Medication Sig Start Date End Date Taking? Authorizing Provider  acetaminophen (TYLENOL) 325 MG tablet Take 325 mg by mouth every 6 (six) hours as needed for moderate pain.   Yes Historical Provider, MD  amiodarone (PACERONE) 200 MG tablet Take 1 tablet (200 mg total) by mouth daily. 10/24/14  Yes Amy D Clegg, NP  aspirin EC 325 MG EC tablet Take 1 tablet (325 mg total) by mouth daily. 10/24/14  Yes Amy D Clegg, NP  atorvastatin (LIPITOR) 40 MG tablet Take 0.5 tablets (20 mg total) by mouth at bedtime. 09/04/14  Yes Larey Dresser, MD  bisacodyl (DULCOLAX) 5 MG EC tablet Take 1 tablet (5 mg total) by mouth daily as needed for moderate constipation. 09/04/14  Yes Larey Dresser, MD  docusate sodium (COLACE) 100 MG capsule Take 1 capsule (100 mg  total) by mouth 2 (two) times daily. 09/04/14  Yes Larey Dresser, MD  furosemide (LASIX) 40 MG tablet Take 0.5 tablets (20 mg total) by mouth as needed. 10/24/14  Yes Amy D Clegg, NP  glipiZIDE (GLUCOTROL) 5 MG tablet Take 1 tablet (5 mg total) by mouth every evening. 10/24/14  Yes Amy D Clegg, NP  hydrALAZINE (APRESOLINE) 25 MG tablet Take 1.5 tablets (37.5 mg total) by mouth every 8 (eight) hours. 10/24/14  Yes Amy D Clegg, NP  insulin glargine (LANTUS) 100 UNIT/ML injection Inject 0.05 mLs (5 Units total) into the skin at bedtime. 10/24/14  Yes Amy D Clegg, NP  isosorbide mononitrate (IMDUR) 30 MG 24 hr tablet Take 1 tablet (30 mg total) by mouth daily. 10/24/14  Yes Amy D Ninfa Meeker, NP  Magnesium 400 MG TABS Take 1 tablet by mouth daily  with breakfast. 10/30/14  Yes Amy D Clegg, NP  pantoprazole (PROTONIX) 40 MG tablet Take 1 tablet (40 mg total) by mouth daily. 10/24/14  Yes Amy D Clegg, NP  traMADol (ULTRAM) 50 MG tablet Take 1-2 tablets (50-100 mg total) by mouth every 6 (six) hours as needed for moderate pain. 10/24/14  Yes Amy D Clegg, NP  warfarin (COUMADIN) 5 MG tablet Take 1 1/2 tablets daily. Patient taking differently: Take 5-7.5 mg by mouth one time only at 6 PM. mwf takes 5 mg, all other days  7.5mg . 10/30/14  Yes Amy D Clegg, NP  albuterol (PROVENTIL HFA;VENTOLIN HFA) 108 (90 BASE) MCG/ACT inhaler Inhale 1-2 puffs into the lungs every 6 (six) hours as needed for wheezing or shortness of breath.    Historical Provider, MD  amoxicillin (AMOXIL) 500 MG capsule Take 4 capsules (2,000 mg total) by mouth as needed (Take 2g (4 pills) 30 - 60 minutes before any dental procedure or cleaning). 10/30/14   Amy Estrella Deeds, NP    Past Medical History: Past Medical History  Diagnosis Date  . Paroxysmal ventricular tachycardia 2005, 2010    s/p AICD '05, replaced w/ St. Jude's in 2010 (PVT/V.Fib arrest requiring ICD implant in 2005)  . HYPERTENSION, UNSPECIFIED   . HYPERLIPIDEMIA-MIXED   . 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 Nonischemic cardiomyopathy     2/2 Adriamycin administration for lymphoma  . CHF (congestive heart failure)     EF 15% by echo 2009; s/p St. Jude ICD  . Diabetes mellitus     Type 2  . Cancer 1992, 2007     non hodkins lymphoma 1992, Hodgkins 2007  . Depression   . LV (left ventricular) mural thrombus 2009    2009, on coumadin  . Small bowel obstruction 2001    s/p Small bowel resection 2001  . Jejunal intussusception 2008    2008  . 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 Splenic mass 2009    Noted on Abd [place] 2009 w/ recs for f/u CT - not done  .  Hypotension   . Noncompliance with medication regimen   . AICD (automatic cardioverter/defibrillator) present 2005, 2010  . Presence of permanent cardiac pacemaker     Past Surgical History: Past Surgical History  Procedure Laterality Date  . Cardiac defibrillator placement      2005, replaced w/ St. Jude's 2010  . Vasectomy    . Bowel resection      small  bowell 2/2 obstruction 2001  . Pacemaker insertion    . Insert / replace / remove pacemaker    . Colonoscopy N/A 09/18/2014    Procedure: COLONOSCOPY;  Surgeon: [name], MD;  Location: [facility] ENDOSCOPY;  Service: Endoscopy;  Laterality: N/A;  . Esophagogastroduodenoscopy N/A 09/18/2014    Procedure: ESOPHAGOGASTRODUODENOSCOPY (EGD);  Surgeon: [name], MD;  Location: [facility] ENDOSCOPY;  Service: Endoscopy;  Laterality: N/A;  . Left and right heart catheterization with coronary angiogram N/A 09/19/2014    Procedure: LEFT AND RIGHT HEART CATHETERIZATION WITH CORONARY ANGIOGRAM;  Surgeon: Jolaine Artist, MD;  Location: [facility] CATH LAB;  Service: Cardiovascular;  Laterality: N/A;  . Colonoscopy N/A 09/19/2014    Procedure: COLONOSCOPY;  Surgeon: [name], MD;  Location: [place];  Service: Gastroenterology;  Laterality: N/A;  . Insertion of implantable left ventricular assist device N/A 10/13/2014    Procedure: INSERTION OF IMPLANTABLE LEFT VENTRICULAR ASSIST DEVICE;  Surgeon: [name], MD;  Location: [place];  Service: Open Heart Surgery;  Laterality: N/A;  CIRC ARREST  NITRIC OXIDE  . Tee without cardioversion N/A 10/13/2014    Procedure: TRANSESOPHAGEAL ECHOCARDIOGRAM (TEE);  Surgeon: [name], MD;  Location: [place];  Service: Open Heart Surgery;  Laterality: N/A;  . Exploration post operative open heart N/A 10/13/2014    Procedure: EXPLORATION POST OPERATIVE OPEN HEART;  Surgeon: [name], MD;  Location: [facility] OR;  Service: Open Heart Surgery;  Laterality: N/A;    Family History: Family History  Problem Relation Age of  Onset  . Other      No known family h/o heart disease  . Heart disease    . Diabetes Mother   . Other Other     complications from hip replacement    Social History: History   Social History  . Marital Status: Married    Spouse Name: N/A  . Number of Children: N/A  . Years of Education: N/A   Social History Main Topics  . Smoking status: Never Smoker   . Smokeless tobacco: Never Used  . Alcohol Use: 1.2 oz/week    1 Cans of beer, 1 Shots of liquor per week     Comment: ON SPECIAL OCCASIONS  . Drug Use: No  . Sexual Activity: No   Other Topics Concern  . None   Social History Narrative    Allergies:  No Known Allergies  Objective:    Vital Signs:   Temp:  [97.9 F (36.6 C)] 97.9 F (36.6 C) (05/16 1315) Pulse Rate:  [73-78] 75 (05/16 1430) Resp:  [15-22] 17 (05/16 1430) BP: (95-109)/(75-82) 109/82 mmHg (05/16 1430) SpO2:  [100 %] 100 % (05/16 1430) Weight:  [148 lb 4 oz (67.246 kg)-151 lb 7 oz (68.692 kg)] 148 lb 4 oz (67.246 kg) (05/16 1314)   Filed Weights   11/24/14 1312 11/24/14 1314  Weight: 151 lb 7 oz (68.692 kg) 148 lb 4 oz (67.246 kg)    Mean arterial Pressure 80s-90s   Physical Exam: General:  Well appearing. No resp difficulty HEENT: normal Neck: supple. JVP flat  Carotids 2+ bilat; no bruits. No lymphadenopathy or thryomegaly appreciated. Cor: Mechanical heart sounds with LVAD hum present. Lungs: clear Abdomen: soft, nontender, nondistended. No hepatosplenomegaly. No bruits or masses. Good bowel sounds. Driveline: C/D/I; securement device intact and driveline incorporated Extremities: no cyanosis, clubbing, rash, edema RUE weak  Neuro: alert & orientedx3, cranial nerves grossly intact. moves all 4 extremities w/o difficulty. Right-sided mild weakness (  chronic from old CVA).   Telemetry: NSR  Labs: Basic Metabolic Panel:  Recent Labs Lab 11/20/14 1350 11/24/14 1346  NA 135 135  K 4.8 4.1  CL 101 103  CO2 22 24  GLUCOSE 146* 99    BUN 21* 18  CREATININE 1.26* 1.11  CALCIUM 9.6 9.2    Liver Function Tests: No results for input(s): AST, ALT, ALKPHOS, BILITOT, PROT, ALBUMIN in the last 168 hours. No results for input(s): LIPASE, AMYLASE in the last 168 hours. No results for input(s): AMMONIA in the last 168 hours.  CBC:  Recent Labs Lab 11/20/14 1350 11/24/14 1346  WBC 10.5 7.9  NEUTROABS  --  5.6  HGB 11.5* 10.2*  HCT 35.2* 31.4*  MCV 87.1 87.5  PLT 303 261    Cardiac Enzymes: No results for input(s): CKTOTAL, CKMB, CKMBINDEX, TROPONINI in the last 168 hours.  BNP: BNP (last 3 results)  Recent Labs  10/14/14 0245 10/20/14 0407 11/05/14 1030  BNP 540.0* 535.3* 118.8*    ProBNP (last 3 results)  Recent Labs  06/12/14 1415 06/18/14 1200  PROBNP 2079.0* 2144.0*     CBG: No results for input(s): GLUCAP in the last 168 hours.  Coagulation Studies:  Recent Labs  11/24/14 1346  LABPROT 31.9*  INR 3.06*    Other results: EKG: Imaging:  No results found.      Assessment/Plan    1. Headache --CT of head with evidence of ICH. Stop aspirin and coumadin. INR today 3.06. Consult Neuro Surgery  2. Chronic systolic HF - Status post LVAD implantation for DT.  Volume status stable. No diuretics for now. Continue current dose of spironolactone, hydralazine, and imdur.  VAD parameters carefully reviewed. Increased PI events noted. 32 PI events 5/15; 50 PI events on 5/14; 25 PI events 5/13; 46 PI events 11/20/14.  - Check LDH, CMET, CBC,PT INR.  2. Anticoagulation management - With evidence of ICH. Stop aspirin and coumadin. Neuro Sugery Consult pending. 3. H/o Non Hodgkins Lymphoma s/p therapy 4. H/O CVA 2008: on left.  5. H/O VT/VF --St Jude ICD 2010  6.DMII 7.  PSVT- Continue  amiodarone 200 mg daily.   Admit to ICU.   I reviewed the LVAD parameters from today, and compared the results to the patient's prior recorded data.  No programming changes were made.  The LVAD  is functioning within specified parameters.  The patient performs LVAD self-test daily.  LVAD interrogation was negative for any significant power changes, alarms or PI events/speed drops.  LVAD equipment check completed and is in good working order.  Back-up equipment present.   LVAD education done on emergency procedures and precautions and reviewed exit site care.  Length of Stay:   CLEGG,AMY NP-C  11/24/2014, 4:34 PM  VAD Team Pager [phone] (7am - 7am) +++VAD ISSUES ONLY+++  Advanced Heart Failure Team Pager [phone] (M-F; [place])  Please contact [facility] for night-coverage after hours (4p -7a ) and weekends on [email or web address] for all non- LVAD Issues  Patient seen with NP, agree with the above note.  Patient has had a severe headache since yesterday.  He denies any new focal neurological symptoms. No trauma.  He has a history of prior CVA on left with right-sided mild weakness.  CTA head today showed intracerebral hemorrhage with mass effect right temporal lobe.  This appears to be spontaneous.  - Stop ASA and coumadin.  - Will need reversal of anticoagulation.  FFP 2 units now. Will need IV vitamin  K.  - Neurosurgery evaluation.   With anticoagulation reversal, he will be at risk for pump thrombosis but unfortunately we have no choice at this time.  Long-term, it appears he is going to have to stay off coumadin as this appears to be a spontaneous ICH.   Multiple PI events.  Possibly a degree of hypovolemia.  Will decrease pump speed to 8800 rpm and stop Lasix.  MAP upper 80s to 90s.    Admission to ICU.  Will need frequent neuro checks overnight.    Loralie Champagne 11/24/2014 5:35 PM'

## 2014-11-24 NOTE — ED Notes (Signed)
Assisted pt to CT with LVAD RN

## 2014-11-24 NOTE — ED Notes (Signed)
Cardiology, Cloyde Reams RN, at bedside.

## 2014-11-24 NOTE — ED Notes (Signed)
Rapid response paged.

## 2014-11-25 ENCOUNTER — Inpatient Hospital Stay (HOSPITAL_COMMUNITY): Payer: Medicare Other

## 2014-11-25 ENCOUNTER — Encounter (HOSPITAL_COMMUNITY): Payer: Self-pay | Admitting: *Deleted

## 2014-11-25 DIAGNOSIS — I5022 Chronic systolic (congestive) heart failure: Secondary | ICD-10-CM

## 2014-11-25 DIAGNOSIS — I611 Nontraumatic intracerebral hemorrhage in hemisphere, cortical: Principal | ICD-10-CM

## 2014-11-25 LAB — CBC WITH DIFFERENTIAL/PLATELET
Basophils Absolute: 0 10*3/uL (ref 0.0–0.1)
Basophils Relative: 0 % (ref 0–1)
EOS PCT: 2 % (ref 0–5)
Eosinophils Absolute: 0.1 10*3/uL (ref 0.0–0.7)
HCT: 29 % — ABNORMAL LOW (ref 39.0–52.0)
Hemoglobin: 9.5 g/dL — ABNORMAL LOW (ref 13.0–17.0)
LYMPHS ABS: 0.8 10*3/uL (ref 0.7–4.0)
LYMPHS PCT: 9 % — AB (ref 12–46)
MCH: 28.4 pg (ref 26.0–34.0)
MCHC: 32.8 g/dL (ref 30.0–36.0)
MCV: 86.8 fL (ref 78.0–100.0)
Monocytes Absolute: 0.7 10*3/uL (ref 0.1–1.0)
Monocytes Relative: 8 % (ref 3–12)
Neutro Abs: 7.3 10*3/uL (ref 1.7–7.7)
Neutrophils Relative %: 81 % — ABNORMAL HIGH (ref 43–77)
PLATELETS: 258 10*3/uL (ref 150–400)
RBC: 3.34 MIL/uL — ABNORMAL LOW (ref 4.22–5.81)
RDW: 14.9 % (ref 11.5–15.5)
WBC: 8.9 10*3/uL (ref 4.0–10.5)

## 2014-11-25 LAB — PREPARE FRESH FROZEN PLASMA
UNIT DIVISION: 0
Unit division: 0

## 2014-11-25 LAB — MAGNESIUM: Magnesium: 1.9 mg/dL (ref 1.7–2.4)

## 2014-11-25 LAB — GLUCOSE, CAPILLARY
GLUCOSE-CAPILLARY: 94 mg/dL (ref 65–99)
Glucose-Capillary: 111 mg/dL — ABNORMAL HIGH (ref 65–99)

## 2014-11-25 LAB — LACTATE DEHYDROGENASE: LDH: 358 U/L — AB (ref 98–192)

## 2014-11-25 LAB — PROTIME-INR
INR: 1.44 (ref 0.00–1.49)
Prothrombin Time: 17.7 seconds — ABNORMAL HIGH (ref 11.6–15.2)

## 2014-11-25 NOTE — Progress Notes (Signed)
HeartMate 2 Rounding Note  Subjective:     Andre Tran is a 54 y.o. 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 Underwent HMII LVAD placement 10/13/2014   Admitted April 1st 2016 for schedule HMII implant. Post implant he was taken back to the OR for bleeding otherwise his post operative course was uneventful. LVAD speed turned down to 9000 on the day of discharge. Discharge weight was 160 pounds.   Admitted 5/16 with headache and CT showed spontaneous intracerebral bleed. NSU following. INR 1.4 aft vit k 5 and 2 u FFP. Repeat head CT this am: Enlarging RIGHT posterior temporal lobe hematomas 3.2 cm, previously 2.6 cm with similar local mass-effect. Denies HA or other focal neuro sx.  VAD speed turned down on admit du to multiple PI events.   LVAD INTERROGATION:  HeartMate II LVAD: Flow 4.9 liters/min, speed 8990, power 5.0, PI 7.0 No PI events   Objective:    Vital Signs:   Temp:  [97.9 F (36.6 C)-100.6 F (38.1 C)] 99.3 F (37.4 C) (05/17 0730) Pulse Rate:  [69-92] 92 (05/17 1000) Resp:  [14-31] 21 (05/17 1000) BP: (95-135)/(57-87) 104/78 mmHg (05/17 1000) SpO2:  [99 %-100 %] 99 % (05/17 1000) Weight:  [67.246 kg (148 lb 4 oz)-70.4 kg (155 lb 3.3 oz)] 70.4 kg (155 lb 3.3 oz) (05/17 0600)   Mean arterial Pressure 70-80s  Intake/Output:   Intake/Output Summary (Last 24 hours) at 11/25/14 1024 Last data filed at 11/25/14 1000  Gross per 24 hour  Intake    926 ml  Output    600 ml  Net    326 ml     Physical Exam: General: Well appearing. No resp difficulty HEENT: normal Neck: supple. JVP flat Carotids 2+ bilat; no bruits. No lymphadenopathy or  thryomegaly appreciated. Cor: Mechanical heart sounds with LVAD hum present. Lungs: clear Abdomen: soft, nontender, nondistended. No hepatosplenomegaly. No bruits or masses. Good bowel sounds. Driveline: C/D/I; securement device intact and driveline incorporated Extremities: no cyanosis, clubbing, rash, edema RUE weak  Neuro: alert & orientedx3, cranial nerves grossly intact. moves all 4 extremities w/o difficulty. Right-sided mild weakness (chronic from old CVA).   Telemetry: NSR  Labs: Basic Metabolic Panel:  Recent Labs Lab 11/20/14 1350 11/24/14 1346 11/25/14 0220  NA 135 135  --   K 4.8 4.1  --   CL 101 103  --   CO2 22 24  --   GLUCOSE 146* 99  --   BUN 21* 18  --   CREATININE 1.26* 1.11  --   CALCIUM 9.6 9.2  --   MG  --   --  1.9    Liver Function Tests: No results for input(s): AST, ALT, ALKPHOS, BILITOT, PROT, ALBUMIN in the last 168 hours. No results for input(s): LIPASE, AMYLASE in the last 168 hours. No results for input(s): AMMONIA in the last 168 hours.  CBC:  Recent Labs Lab 11/20/14 1350 11/24/14 1346 11/25/14 0220  WBC 10.5 7.9 8.9  NEUTROABS  --  5.6 7.3  HGB 11.5* 10.2* 9.5*  HCT 35.2* 31.4* 29.0*  MCV 87.1 87.5 86.8  PLT 303 261 258    INR:  Recent Labs Lab 11/20/14 1350 11/24/14  1346 11/24/14 2113 11/25/14 0220  INR 2.09* 3.06* 1.67* 1.44    Other results:  EKG:   Imaging: Ct Angio Head W/cm &/or Wo Cm  11/24/2014   CLINICAL DATA:  54 year old male with right-sided headache since yesterday. Left ventricular assist device placement 11/19/2014. Initial encounter.  EXAM: CT ANGIOGRAPHY HEAD AND NECK  TECHNIQUE: Multidetector CT imaging of the head and neck was performed using the standard protocol during bolus administration of intravenous contrast. Multiplanar CT image reconstructions and MIPs were obtained to evaluate the vascular anatomy. Carotid stenosis measurements (when applicable) are obtained utilizing NASCET criteria,  using the distal internal carotid diameter as the denominator.  CONTRAST:  80 cc Omnipaque 350.  COMPARISON:  None.  01/22/2013 neck CT. 08/15/2007 head CT. Catheter cerebral angiogram 04/18/2007.  FINDINGS: Brain: Right temporal lobe 2.8 cm hematoma with surrounding vasogenic edema and breakthrough into the subarachnoid space with subarachnoid blood right temporal-parietal region. 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  Calvarium and skull base: Negative.  Paranasal sinuses: Negative.  Orbits: Negative.  CTA HEAD  Anterior circulation: No aneurysm or vascular malformation seen at the level of the right temporal lobe hematoma. Moderate to marked narrowing M1 segment left middle cerebral artery with decreased caliber of left middle cerebral artery branch vessels. Calcified internal carotid artery cavernous segment with mild narrowing greater on the left.  Posterior circulation: Left vertebral artery is dominant. Narrowing of the right vertebral artery after takeoff of the posterior inferior cerebellar artery. Slight irregularity and narrowing of the basilar artery without high-grade stenosis.  Venous sinuses: Major dural sinuses are patent.  Anatomic variants: None.  Delayed phase:No significant abnormality.  CTA NECK  Aortic arch: 3 vessel aortic arch with close origin of the innominate and left common carotid artery.  Right carotid system: Mild plaque carotid bifurcation/proximal right internal carotid artery without significant narrowing.  Left carotid system: No significant narrowing.  Vertebral arteries:Mild to slightly moderate narrowing proximal left vertebral artery which is the dominant vertebral artery. Moderate narrowing proximal right vertebral artery.  Skeleton: Cervical spondylotic changes most notable C4-5.  Other neck: Left pleural effusion.  IMPRESSION: Right temporal lobe 2.8 cm hematoma with surrounding vasogenic edema and  breakthrough into the subarachnoid space with subarachnoid blood right temporal-parietal region. No aneurysm or vascular malformation is detected at this level on the current exam. Given location of hemorrhage, question if this hemorrhage may be related to thrombosis of the vein of Labbe.  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  CTA HEAD  Moderate to marked narrowing M1 segment left middle cerebral artery with decreased caliber of left middle cerebral artery branch vessels.  Calcified internal carotid artery cavernous segment with mild narrowing greater on the left.  CTA NECK  No hemodynamically significant stenosis involving either carotid bifurcation.  Mild to slightly moderate narrowing proximal left vertebral artery which is the dominant vertebral artery. Moderate narrowing proximal right vertebral artery.  Please see above for further detail.  These results were called by telephone at the time of interpretation on 11/24/2014 at 4:45 pm to Dr. Ralene Bathe and Dr. Loralie Champagne, who verbally acknowledged these results.   Electronically Signed   By: Genia Del M.D.   On: 11/24/2014 17:14   Ct Head Wo Contrast  11/25/2014   CLINICAL DATA:  Follow-up intracranial hemorrhage. History of pacemaker. History of CHF, diabetes, lymphoma, hypertension.  EXAM: CT HEAD WITHOUT CONTRAST  TECHNIQUE: Contiguous  axial images were obtained from the base of the skull through the vertex without intravenous contrast.  COMPARISON:  CT of the head Nov 24, 2014  FINDINGS: RIGHT posterior temporal lobe 3.2 x 2.5 cm intraparenchymal hematoma was 2.6 x 2 cm. Surrounding low-density vasogenic edema and local sulcal effacement without midline shift. Mildly effaced RIGHT occipital horn. However, there is LEFT cerebral volume loss due to large cystic LEFT basal ganglia lacunar infarct with ex vacuo dilatation LEFT lateral ventricle. No hydrocephalus. No acute large vascular  territory infarct. Mild white matter changes suggest chronic small vessel ischemic disease.  Mildly effaced RIGHT basal cistern. Minimal cortical venous congestion versus trace subarachnoid hemorrhage RIGHT middle cranial fossa.  Ocular globes and orbital contents are unremarkable unremarkable. Visualized paranasal sinuses and mastoid air cells are well aerated. No skull fracture.  IMPRESSION: Enlarging RIGHT posterior temporal lobe hematomas 3.2 cm, previously 2.6 cm with similar local mass-effect. Trace RIGHT middle cranial fossa subarachnoid blood versus cortical venous congestion.  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   Electronically Signed   By: Elon Alas   On: 11/25/2014 06:07   Ct Angio Neck W/cm &/or Wo/cm  11/24/2014   CLINICAL DATA:  54 year old male with right-sided headache since yesterday. Left ventricular assist device placement 11/19/2014. Initial encounter.  EXAM: CT ANGIOGRAPHY HEAD AND NECK  TECHNIQUE: Multidetector CT imaging of the head and neck was performed using the standard protocol during bolus administration of intravenous contrast. Multiplanar CT image reconstructions and MIPs were obtained to evaluate the vascular anatomy. Carotid stenosis measurements (when applicable) are obtained utilizing NASCET criteria, using the distal internal carotid diameter as the denominator.  CONTRAST:  80 cc Omnipaque 350.  COMPARISON:  None.  01/22/2013 neck CT. 08/15/2007 head CT. Catheter cerebral angiogram 04/18/2007.  FINDINGS: Brain: Right temporal lobe 2.8 cm hematoma with surrounding vasogenic edema and breakthrough into the subarachnoid space with subarachnoid blood right temporal-parietal region. 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  Calvarium and skull base: Negative.  Paranasal sinuses: Negative.  Orbits: Negative.  CTA HEAD  Anterior circulation: No aneurysm or vascular malformation seen at the  level of the right temporal lobe hematoma. Moderate to marked narrowing M1 segment left middle cerebral artery with decreased caliber of left middle cerebral artery branch vessels. Calcified internal carotid artery cavernous segment with mild narrowing greater on the left.  Posterior circulation: Left vertebral artery is dominant. Narrowing of the right vertebral artery after takeoff of the posterior inferior cerebellar artery. Slight irregularity and narrowing of the basilar artery without high-grade stenosis.  Venous sinuses: Major dural sinuses are patent.  Anatomic variants: None.  Delayed phase:No significant abnormality.  CTA NECK  Aortic arch: 3 vessel aortic arch with close origin of the innominate and left common carotid artery.  Right carotid system: Mild plaque carotid bifurcation/proximal right internal carotid artery without significant narrowing.  Left carotid system: No significant narrowing.  Vertebral arteries:Mild to slightly moderate narrowing proximal left vertebral artery which is the dominant vertebral artery. Moderate narrowing proximal right vertebral artery.  Skeleton: Cervical spondylotic changes most notable C4-5.  Other neck: Left pleural effusion.  IMPRESSION: Right temporal lobe 2.8 cm hematoma with surrounding vasogenic edema and breakthrough into the subarachnoid space with subarachnoid blood right temporal-parietal region. No aneurysm or vascular malformation is detected at this level on the current exam. Given location of hemorrhage, question if this hemorrhage may be related to thrombosis of the vein of Labbe.  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  CTA HEAD  Moderate to marked narrowing M1 segment left middle cerebral artery with decreased caliber of left middle cerebral artery branch vessels.  Calcified internal carotid artery cavernous segment with mild narrowing greater on the left.  CTA NECK  No  hemodynamically significant stenosis involving either carotid bifurcation.  Mild to slightly moderate narrowing proximal left vertebral artery which is the dominant vertebral artery. Moderate narrowing proximal right vertebral artery.  Please see above for further detail.  These results were called by telephone at the time of interpretation on 11/24/2014 at 4:45 pm to Dr. Ralene Bathe and Dr. Loralie Champagne, who verbally acknowledged these results.   Electronically Signed   By: Genia Del M.D.   On: 11/24/2014 17:14   Dg Chest Port 1 View  11/24/2014   CLINICAL DATA:  Headache for 1 day, history hypertension, diabetes, CHF, paroxysmal atrial fibrillation, LEFT ventricular assist device  EXAM: PORTABLE CHEST - 1 VIEW  COMPARISON:  Portable exam 1727 hours compared to 10/23/2014  FINDINGS: Post median sternotomy with LEFT ventricular assist device again seen.  LEFT subclavian AICD leads project over RIGHT atrium and RIGHT ventricle.  Minimal enlargement of cardiac silhouette.  Mediastinal contours and pulmonary vascularity normal.  Lungs clear.  No pleural effusion or pneumothorax.  Osseous structures stable.  IMPRESSION: Post LVAD and AICD.  No acute abnormalities.   Electronically Signed   By: Lavonia Dana M.D.   On: 11/24/2014 17:51      Medications:     Scheduled Medications: . sodium chloride  10 mL/hr Intravenous Once  . amiodarone  200 mg Oral Daily  . atorvastatin  20 mg Oral QHS  . docusate sodium  100 mg Oral BID  . hydrALAZINE  37.5 mg Oral 3 times per day  . isosorbide mononitrate  30 mg Oral Daily  . pantoprazole  40 mg Oral Daily     Infusions:     PRN Medications:  acetaminophen, albuterol, bisacodyl, traMADol   Assessment:   1. Spontaneous intracerebral hemorrhage 2. Chronic systolic HF - Status post LVAD implantation for DT.  Volume status stable. No diuretics for now. Continue current dose of spironolactone, hydralazine, and imdur.  VAD parameters carefully reviewed.  Increased PI events noted. 32 PI events 5/15; 50 PI events on 5/14; 25 PI events 5/13; 46 PI events 11/20/14.  - Check LDH, CMET, CBC,PT INR.  2. Anticoagulation management - Reversed with FFP. Holding all anti-coagulants now.  3. H/o Non Hodgkins Lymphoma s/p therapy 4. H/O CVA 2008: on left.  5. H/O VT/VF --St Jude ICD 2010  6.DMII 7. PSVT- Continue amiodarone 200 mg daily.    Plan/Discussion:    ICH slightly enlarged but exam stable. NSU following. No need for drain at this point. Will repeat CT in am.   Continue to hold all anticoagulation. I turned VAD speedback up to hopefully reduce the chance of thrombotic events.   Long-term management of anticoagulation will be a challenge in setting of VAD and spontaneous ICH.   Keep in ICU. Appreciate Dr. Arnoldo Morale' care.    I reviewed the LVAD parameters from today, and compared the results to the patient's prior recorded data.  No programming changes were made.  The LVAD is functioning within specified parameters.  The patient performs LVAD self-test daily.  LVAD interrogation was negative for any significant power changes, alarms or PI events/speed drops.  LVAD equipment check completed and is in  good working order.  Back-up equipment present.   LVAD education done on emergency procedures and precautions and reviewed exit site care.  The patient is critically ill with multiple organ systems failure and requires high complexity decision making for assessment and support, frequent evaluation and titration of therapies, application of advanced monitoring technologies and extensive interpretation of multiple databases.   Critical Care Time devoted to patient care services described in this note is 35 Minutes.   Length of Stay: 1  Linzey Ramser MD 11/25/2014, 10:24 AM  VAD Team --- VAD ISSUES ONLY--- Pager [phone] (7am - 7am)  Advanced Heart Failure Team  Pager [phone] (M-F; 7a - 4p)  Please contact [facility] for  night-coverage after hours (4p -7a ) and weekends on [email or web address]

## 2014-11-25 NOTE — Progress Notes (Signed)
Pt transported to CT via Bed with travel pack at bedside. Pt taken off a/c unit and placed on battery with full monitoring for transport. Upon return to room, VADt switched back to A/C power.

## 2014-11-25 NOTE — Progress Notes (Signed)
Patient ID: Andre Tran, male   DOB: March 09, 1961, 54 y.o.   MRN: [id number] Subjective:  Patient is alert and pleasant. He denies headaches. He is in no apparent distress.  Objective: Vital signs in last 24 hours: Temp:  [97.9 F (36.6 C)-100.6 F (38.1 C)] 99.3 F (37.4 C) (05/17 0730) Pulse Rate:  [69-90] 84 (05/17 0700) Resp:  [14-31] 19 (05/17 0700) BP: (95-135)/(57-87) 114/82 mmHg (05/17 0700) SpO2:  [99 %-100 %] 100 % (05/17 0700) Weight:  [67.246 kg (148 lb 4 oz)-70.4 kg (155 lb 3.3 oz)] 70.4 kg (155 lb 3.3 oz) (05/17 0600)  Intake/Output from previous day: 05/16 0701 - 05/17 0700 In: 866 [I.V.:250; Blood:616] Out: 600 [Urine:600] Intake/Output this shift:    Physical exam the patient is alert and oriented 3. His speech is normal. His strength is normal. His pupils are equal.  I have reviewed the patient's follow-up head CT performed this morning. It demonstrates his hemorrhage has enlarged slightly.  Lab Results:  Recent Labs  11/24/14 1346 11/25/14 0220  WBC 7.9 8.9  HGB 10.2* 9.5*  HCT 31.4* 29.0*  PLT 261 258   BMET  Recent Labs  11/24/14 1346  NA 135  K 4.1  CL 103  CO2 24  GLUCOSE 99  BUN 18  CREATININE 1.11  CALCIUM 9.2    Studies/Results: Ct Angio Head W/cm &/or Wo Cm  11/24/2014   CLINICAL DATA:  54 year old male with right-sided headache since yesterday. Left ventricular assist device placement 11/19/2014. Initial encounter.  EXAM: CT ANGIOGRAPHY HEAD AND NECK  TECHNIQUE: Multidetector CT imaging of the head and neck was performed using the standard protocol during bolus administration of intravenous contrast. Multiplanar CT image reconstructions and MIPs were obtained to evaluate the vascular anatomy. Carotid stenosis measurements (when applicable) are obtained utilizing NASCET criteria, using the distal internal carotid diameter as the denominator.  CONTRAST:  80 cc Omnipaque 350.  COMPARISON:  None.  01/22/2013 neck CT. 08/15/2007 head CT.  Catheter cerebral angiogram 04/18/2007.  FINDINGS: Brain: Right temporal lobe 2.8 cm hematoma with surrounding vasogenic edema and breakthrough into the subarachnoid space with subarachnoid blood right temporal-parietal region. 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  Calvarium and skull base: Negative.  Paranasal sinuses: Negative.  Orbits: Negative.  CTA HEAD  Anterior circulation: No aneurysm or vascular malformation seen at the level of the right temporal lobe hematoma. Moderate to marked narrowing M1 segment left middle cerebral artery with decreased caliber of left middle cerebral artery branch vessels. Calcified internal carotid artery cavernous segment with mild narrowing greater on the left.  Posterior circulation: Left vertebral artery is dominant. Narrowing of the right vertebral artery after takeoff of the posterior inferior cerebellar artery. Slight irregularity and narrowing of the basilar artery without high-grade stenosis.  Venous sinuses: Major dural sinuses are patent.  Anatomic variants: None.  Delayed phase:No significant abnormality.  CTA NECK  Aortic arch: 3 vessel aortic arch with close origin of the innominate and left common carotid artery.  Right carotid system: Mild plaque carotid bifurcation/proximal right internal carotid artery without significant narrowing.  Left carotid system: No significant narrowing.  Vertebral arteries:Mild to slightly moderate narrowing proximal left vertebral artery which is the dominant vertebral artery. Moderate narrowing proximal right vertebral artery.  Skeleton: Cervical spondylotic changes most notable C4-5.  Other neck: Left pleural effusion.  IMPRESSION: Right temporal lobe 2.8 cm hematoma with surrounding vasogenic edema and breakthrough into the subarachnoid space with subarachnoid  blood right temporal-parietal region. No aneurysm or vascular malformation is detected at this level on  the current exam. Given location of hemorrhage, question if this hemorrhage may be related to thrombosis of the vein of Labbe.  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  CTA HEAD  Moderate to marked narrowing M1 segment left middle cerebral artery with decreased caliber of left middle cerebral artery branch vessels.  Calcified internal carotid artery cavernous segment with mild narrowing greater on the left.  CTA NECK  No hemodynamically significant stenosis involving either carotid bifurcation.  Mild to slightly moderate narrowing proximal left vertebral artery which is the dominant vertebral artery. Moderate narrowing proximal right vertebral artery.  Please see above for further detail.  These results were called by telephone at the time of interpretation on 11/24/2014 at 4:45 pm to Dr. Ralene Bathe and Dr. Loralie Champagne, who verbally acknowledged these results.   Electronically Signed   By: Genia Del M.D.   On: 11/24/2014 17:14   Ct Head Wo Contrast  11/25/2014   CLINICAL DATA:  Follow-up intracranial hemorrhage. History of pacemaker. History of CHF, diabetes, lymphoma, hypertension.  EXAM: CT HEAD WITHOUT CONTRAST  TECHNIQUE: Contiguous axial images were obtained from the base of the skull through the vertex without intravenous contrast.  COMPARISON:  CT of the head Nov 24, 2014  FINDINGS: RIGHT posterior temporal lobe 3.2 x 2.5 cm intraparenchymal hematoma was 2.6 x 2 cm. Surrounding low-density vasogenic edema and local sulcal effacement without midline shift. Mildly effaced RIGHT occipital horn. However, there is LEFT cerebral volume loss due to large cystic LEFT basal ganglia lacunar infarct with ex vacuo dilatation LEFT lateral ventricle. No hydrocephalus. No acute large vascular territory infarct. Mild white matter changes suggest chronic small vessel ischemic disease.  Mildly effaced RIGHT basal cistern. Minimal cortical venous congestion  versus trace subarachnoid hemorrhage RIGHT middle cranial fossa.  Ocular globes and orbital contents are unremarkable unremarkable. Visualized paranasal sinuses and mastoid air cells are well aerated. No skull fracture.  IMPRESSION: Enlarging RIGHT posterior temporal lobe hematomas 3.2 cm, previously 2.6 cm with similar local mass-effect. Trace RIGHT middle cranial fossa subarachnoid blood versus cortical venous congestion.  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   Electronically Signed   By: Elon Alas   On: 11/25/2014 06:07   Ct Angio Neck W/cm &/or Wo/cm  11/24/2014   CLINICAL DATA:  54 year old male with right-sided headache since yesterday. Left ventricular assist device placement 11/19/2014. Initial encounter.  EXAM: CT ANGIOGRAPHY HEAD AND NECK  TECHNIQUE: Multidetector CT imaging of the head and neck was performed using the standard protocol during bolus administration of intravenous contrast. Multiplanar CT image reconstructions and MIPs were obtained to evaluate the vascular anatomy. Carotid stenosis measurements (when applicable) are obtained utilizing NASCET criteria, using the distal internal carotid diameter as the denominator.  CONTRAST:  80 cc Omnipaque 350.  COMPARISON:  None.  01/22/2013 neck CT. 08/15/2007 head CT. Catheter cerebral angiogram 04/18/2007.  FINDINGS: Brain: Right temporal lobe 2.8 cm hematoma with surrounding vasogenic edema and breakthrough into the subarachnoid space with subarachnoid blood right temporal-parietal region. 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  Calvarium and skull base: Negative.  Paranasal sinuses: Negative.  Orbits: Negative.  CTA HEAD  Anterior circulation: No aneurysm or vascular malformation seen at the level of the right temporal lobe hematoma. Moderate to marked narrowing M1 segment left middle cerebral artery with  decreased caliber of left middle cerebral artery  branch vessels. Calcified internal carotid artery cavernous segment with mild narrowing greater on the left.  Posterior circulation: Left vertebral artery is dominant. Narrowing of the right vertebral artery after takeoff of the posterior inferior cerebellar artery. Slight irregularity and narrowing of the basilar artery without high-grade stenosis.  Venous sinuses: Major dural sinuses are patent.  Anatomic variants: None.  Delayed phase:No significant abnormality.  CTA NECK  Aortic arch: 3 vessel aortic arch with close origin of the innominate and left common carotid artery.  Right carotid system: Mild plaque carotid bifurcation/proximal right internal carotid artery without significant narrowing.  Left carotid system: No significant narrowing.  Vertebral arteries:Mild to slightly moderate narrowing proximal left vertebral artery which is the dominant vertebral artery. Moderate narrowing proximal right vertebral artery.  Skeleton: Cervical spondylotic changes most notable C4-5.  Other neck: Left pleural effusion.  IMPRESSION: Right temporal lobe 2.8 cm hematoma with surrounding vasogenic edema and breakthrough into the subarachnoid space with subarachnoid blood right temporal-parietal region. No aneurysm or vascular malformation is detected at this level on the current exam. Given location of hemorrhage, question if this hemorrhage may be related to thrombosis of the vein of Labbe.  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  CTA HEAD  Moderate to marked narrowing M1 segment left middle cerebral artery with decreased caliber of left middle cerebral artery branch vessels.  Calcified internal carotid artery cavernous segment with mild narrowing greater on the left.  CTA NECK  No hemodynamically significant stenosis involving either carotid bifurcation.  Mild to slightly moderate narrowing proximal left vertebral artery which is the dominant vertebral  artery. Moderate narrowing proximal right vertebral artery.  Please see above for further detail.  These results were called by telephone at the time of interpretation on 11/24/2014 at 4:45 pm to Dr. Ralene Bathe and Dr. Loralie Champagne, who verbally acknowledged these results.   Electronically Signed   By: Genia Del M.D.   On: 11/24/2014 17:14   Dg Chest Port 1 View  11/24/2014   CLINICAL DATA:  Headache for 1 day, history hypertension, diabetes, CHF, paroxysmal atrial fibrillation, LEFT ventricular assist device  EXAM: PORTABLE CHEST - 1 VIEW  COMPARISON:  Portable exam 1727 hours compared to 10/23/2014  FINDINGS: Post median sternotomy with LEFT ventricular assist device again seen.  LEFT subclavian AICD leads project over RIGHT atrium and RIGHT ventricle.  Minimal enlargement of cardiac silhouette.  Mediastinal contours and pulmonary vascularity normal.  Lungs clear.  No pleural effusion or pneumothorax.  Osseous structures stable.  IMPRESSION: Post LVAD and AICD.  No acute abnormalities.   Electronically Signed   By: Lavonia Dana M.D.   On: 11/24/2014 17:51    Assessment/Plan: Right temporal intracerebral hemorrhage: The patient's follow-up scan demonstrates slight enlargement of his hematoma. He is doing well clinically. I would recommend repeating his CAT scan tomorrow. If the CAT scan is without significant change then perhaps we could gently restart his anticoagulation.  LOS: 1 day     Monzerat Handler D 11/25/2014, 8:01 AM

## 2014-11-25 NOTE — Progress Notes (Signed)
Utilization review completed.  

## 2014-11-25 NOTE — Progress Notes (Signed)
Admitted 11/24/14 due to ICH..    HeartMate II LVAD implated on 10/13/14 by Dr. Bartle as Destination Therapy VAD.   Vital signs: HR: 89 Doppler MAP:  82 Automated BP:  107/81 (86) O2 Sat: 100 Wt:  154.8 lbs   LVAD interrogation reveals:  Speed:  8800 Flow:  5.1 Power:  5.1 PI:  6.6 Alarms: none Events:  None since speed dropped to 8800 from 9000 yesterday afternoon Fixed speed:  8800 Low speed limit: 8200  Drive Line:  Dressing dry and intact with attachment device in place; dressing being changed weekly per sterile technique using weekly dressing kit.  Labs:  LDH trend:  479>341>358  INR trend:  2.09>3.06>1.67>1.44  Hgb trend:  11.5>10.2>9.5  Plan/Recommendations as discussed with team: 1.  Monitor neuro status; Dr. Jenkins plans for repeat CT scan of head tomorrow. 2.  Monitor LDH and VAD power trends since anticoagulation reversal required for ICH.     

## 2014-11-26 ENCOUNTER — Encounter (HOSPITAL_COMMUNITY): Payer: Medicare Other

## 2014-11-26 ENCOUNTER — Inpatient Hospital Stay (HOSPITAL_COMMUNITY): Payer: Medicare Other

## 2014-11-26 LAB — PROTIME-INR
INR: 1.25 (ref 0.00–1.49)
Prothrombin Time: 15.8 seconds — ABNORMAL HIGH (ref 11.6–15.2)

## 2014-11-26 LAB — LACTATE DEHYDROGENASE: LDH: 428 U/L — ABNORMAL HIGH (ref 98–192)

## 2014-11-26 MED ORDER — SODIUM CHLORIDE 0.9 % IV BOLUS (SEPSIS)
500.0000 mL | Freq: Once | INTRAVENOUS | Status: AC
  Administered 2014-11-26: 500 mL via INTRAVENOUS

## 2014-11-26 MED ORDER — HYDRALAZINE HCL 20 MG/ML IJ SOLN
10.0000 mg | INTRAMUSCULAR | Status: DC | PRN
  Administered 2014-11-26: 10 mg via INTRAVENOUS
  Filled 2014-11-26 (×2): qty 1

## 2014-11-26 NOTE — Progress Notes (Signed)
HeartMate 2 Rounding Note  Subjective:     Mr Bornemann is a 54 y.o. 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 Underwent HMII LVAD placement 10/13/2014   Admitted April 1st 2016 for schedule HMII implant. Post implant he was taken back to the OR for bleeding otherwise his post operative course was uneventful. LVAD speed turned down to 9000 on the day of discharge. Discharge weight was 160 pounds.   Admitted 5/16 with headache and CT showed spontaneous intracerebral bleed. NSU following. INR 1.4 aft vit k 5 and 2 u FFP. Repeat head CT this am: Enlarging RIGHT posterior temporal lobe hematomas 3.2 cm, previously 2.6 cm with similar local mass-effect.   Denies HA or other focal neuro sx. Denies SOB.  INR 1.25  .   LVAD INTERROGATION:  HeartMate II LVAD: Flow 5.2 liters/min, speed 9000, power 6.0, PI 5.2 No PI events   Objective:    Vital Signs:   Temp:  [98 F (36.7 C)-101.2 F (38.4 C)] 98 F (36.7 C) (05/18 0739) Pulse Rate:  [79-101] 89 (05/18 0700) Resp:  [14-25] 19 (05/18 0700) BP: (86-114)/(62-86) 95/80 mmHg (05/18 0700) SpO2:  [98 %-100 %] 100 % (05/18 0700) Weight:  [155 lb 6.8 oz (70.5 kg)] 155 lb 6.8 oz (70.5 kg) (05/18 0500)   Mean arterial Pressure  85   Intake/Output:   Intake/Output Summary (Last 24 hours) at 11/26/14 1031 Last data filed at 11/26/14 0500  Gross per 24 hour  Intake   1080 ml  Output    775 ml  Net    305 ml     Physical Exam: General: Well appearing. No resp difficulty. Sitting in the chair HEENT: normal Neck: supple. JVP flat Carotids 2+ bilat; no bruits. No lymphadenopathy or thryomegaly appreciated. Cor: Mechanical  heart sounds with LVAD hum present. Lungs: clear Abdomen: soft, nontender, nondistended. No hepatosplenomegaly. No bruits or masses. Good bowel sounds. Driveline: C/D/I; securement device intact and driveline incorporated Extremities: no cyanosis, clubbing, rash, edema RUE weak  R and LLE SCDs.  Neuro: alert & orientedx3, cranial nerves grossly intact. moves all 4 extremities w/o difficulty. Right-sided mild weakness (chronic from old CVA).   Telemetry: NSR  Labs: Basic Metabolic Panel:  Recent Labs Lab 11/20/14 1350 11/24/14 1346 11/25/14 0220  NA 135 135  --   K 4.8 4.1  --   CL 101 103  --   CO2 22 24  --   GLUCOSE 146* 99  --   BUN 21* 18  --   CREATININE 1.26* 1.11  --   CALCIUM 9.6 9.2  --   MG  --   --  1.9    Liver Function Tests: No results for input(s): AST, ALT, ALKPHOS, BILITOT, PROT, ALBUMIN in the last 168 hours. No results for input(s): LIPASE, AMYLASE in the last 168 hours. No results for input(s): AMMONIA in the last 168 hours.  CBC:  Recent Labs Lab 11/20/14 1350 11/24/14 1346 11/25/14 0220  WBC 10.5 7.9 8.9  NEUTROABS  --  5.6 7.3  HGB 11.5* 10.2* 9.5*  HCT 35.2* 31.4* 29.0*  MCV 87.1 87.5 86.8  PLT 303 261 258    INR:  Recent Labs Lab 11/20/14  1350 11/24/14 1346 11/24/14 2113 11/25/14 0220 11/26/14 0238  INR 2.09* 3.06* 1.67* 1.44 1.25    Other results:  EKG:   Imaging: Ct Angio Head W/cm &/or Wo Cm  11/24/2014   CLINICAL DATA:  54 year old male with right-sided headache since yesterday. Left ventricular assist device placement 11/19/2014. Initial encounter.  EXAM: CT ANGIOGRAPHY HEAD AND NECK  TECHNIQUE: Multidetector CT imaging of the head and neck was performed using the standard protocol during bolus administration of intravenous contrast. Multiplanar CT image reconstructions and MIPs were obtained to evaluate the vascular anatomy. Carotid stenosis measurements (when applicable) are obtained utilizing NASCET criteria, using  the distal internal carotid diameter as the denominator.  CONTRAST:  80 cc Omnipaque 350.  COMPARISON:  None.  01/22/2013 neck CT. 08/15/2007 head CT. Catheter cerebral angiogram 04/18/2007.  FINDINGS: Brain: Right temporal lobe 2.8 cm hematoma with surrounding vasogenic edema and breakthrough into the subarachnoid space with subarachnoid blood right temporal-parietal region. 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  Calvarium and skull base: Negative.  Paranasal sinuses: Negative.  Orbits: Negative.  CTA HEAD  Anterior circulation: No aneurysm or vascular malformation seen at the level of the right temporal lobe hematoma. Moderate to marked narrowing M1 segment left middle cerebral artery with decreased caliber of left middle cerebral artery branch vessels. Calcified internal carotid artery cavernous segment with mild narrowing greater on the left.  Posterior circulation: Left vertebral artery is dominant. Narrowing of the right vertebral artery after takeoff of the posterior inferior cerebellar artery. Slight irregularity and narrowing of the basilar artery without high-grade stenosis.  Venous sinuses: Major dural sinuses are patent.  Anatomic variants: None.  Delayed phase:No significant abnormality.  CTA NECK  Aortic arch: 3 vessel aortic arch with close origin of the innominate and left common carotid artery.  Right carotid system: Mild plaque carotid bifurcation/proximal right internal carotid artery without significant narrowing.  Left carotid system: No significant narrowing.  Vertebral arteries:Mild to slightly moderate narrowing proximal left vertebral artery which is the dominant vertebral artery. Moderate narrowing proximal right vertebral artery.  Skeleton: Cervical spondylotic changes most notable C4-5.  Other neck: Left pleural effusion.  IMPRESSION: Right temporal lobe 2.8 cm hematoma with surrounding vasogenic edema and breakthrough  into the subarachnoid space with subarachnoid blood right temporal-parietal region. No aneurysm or vascular malformation is detected at this level on the current exam. Given location of hemorrhage, question if this hemorrhage may be related to thrombosis of the vein of Labbe.  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  CTA HEAD  Moderate to marked narrowing M1 segment left middle cerebral artery with decreased caliber of left middle cerebral artery branch vessels.  Calcified internal carotid artery cavernous segment with mild narrowing greater on the left.  CTA NECK  No hemodynamically significant stenosis involving either carotid bifurcation.  Mild to slightly moderate narrowing proximal left vertebral artery which is the dominant vertebral artery. Moderate narrowing proximal right vertebral artery.  Please see above for further detail.  These results were called by telephone at the time of interpretation on 11/24/2014 at 4:45 pm to Dr. Ralene Bathe and Dr. Loralie Champagne, who verbally acknowledged these results.   Electronically Signed   By: Genia Del M.D.   On: 11/24/2014 17:14   Ct Head Wo Contrast  11/25/2014   CLINICAL DATA:  Follow-up intracranial hemorrhage. History of pacemaker. History of CHF, diabetes, lymphoma, hypertension.  EXAM: CT HEAD  WITHOUT CONTRAST  TECHNIQUE: Contiguous axial images were obtained from the base of the skull through the vertex without intravenous contrast.  COMPARISON:  CT of the head Nov 24, 2014  FINDINGS: RIGHT posterior temporal lobe 3.2 x 2.5 cm intraparenchymal hematoma was 2.6 x 2 cm. Surrounding low-density vasogenic edema and local sulcal effacement without midline shift. Mildly effaced RIGHT occipital horn. However, there is LEFT cerebral volume loss due to large cystic LEFT basal ganglia lacunar infarct with ex vacuo dilatation LEFT lateral ventricle. No hydrocephalus. No acute large vascular territory  infarct. Mild white matter changes suggest chronic small vessel ischemic disease.  Mildly effaced RIGHT basal cistern. Minimal cortical venous congestion versus trace subarachnoid hemorrhage RIGHT middle cranial fossa.  Ocular globes and orbital contents are unremarkable unremarkable. Visualized paranasal sinuses and mastoid air cells are well aerated. No skull fracture.  IMPRESSION: Enlarging RIGHT posterior temporal lobe hematomas 3.2 cm, previously 2.6 cm with similar local mass-effect. Trace RIGHT middle cranial fossa subarachnoid blood versus cortical venous congestion.  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   Electronically Signed   By: Elon Alas   On: 11/25/2014 06:07   Ct Angio Neck W/cm &/or Wo/cm  11/24/2014   CLINICAL DATA:  54 year old male with right-sided headache since yesterday. Left ventricular assist device placement 11/19/2014. Initial encounter.  EXAM: CT ANGIOGRAPHY HEAD AND NECK  TECHNIQUE: Multidetector CT imaging of the head and neck was performed using the standard protocol during bolus administration of intravenous contrast. Multiplanar CT image reconstructions and MIPs were obtained to evaluate the vascular anatomy. Carotid stenosis measurements (when applicable) are obtained utilizing NASCET criteria, using the distal internal carotid diameter as the denominator.  CONTRAST:  80 cc Omnipaque 350.  COMPARISON:  None.  01/22/2013 neck CT. 08/15/2007 head CT. Catheter cerebral angiogram 04/18/2007.  FINDINGS: Brain: Right temporal lobe 2.8 cm hematoma with surrounding vasogenic edema and breakthrough into the subarachnoid space with subarachnoid blood right temporal-parietal region. 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  Calvarium and skull base: Negative.  Paranasal sinuses: Negative.  Orbits: Negative.  CTA HEAD  Anterior circulation: No aneurysm or vascular malformation seen at the level of  the right temporal lobe hematoma. Moderate to marked narrowing M1 segment left middle cerebral artery with decreased caliber of left middle cerebral artery branch vessels. Calcified internal carotid artery cavernous segment with mild narrowing greater on the left.  Posterior circulation: Left vertebral artery is dominant. Narrowing of the right vertebral artery after takeoff of the posterior inferior cerebellar artery. Slight irregularity and narrowing of the basilar artery without high-grade stenosis.  Venous sinuses: Major dural sinuses are patent.  Anatomic variants: None.  Delayed phase:No significant abnormality.  CTA NECK  Aortic arch: 3 vessel aortic arch with close origin of the innominate and left common carotid artery.  Right carotid system: Mild plaque carotid bifurcation/proximal right internal carotid artery without significant narrowing.  Left carotid system: No significant narrowing.  Vertebral arteries:Mild to slightly moderate narrowing proximal left vertebral artery which is the dominant vertebral artery. Moderate narrowing proximal right vertebral artery.  Skeleton: Cervical spondylotic changes most notable C4-5.  Other neck: Left pleural effusion.  IMPRESSION: Right temporal lobe 2.8 cm hematoma with surrounding vasogenic edema and breakthrough into the subarachnoid space with subarachnoid blood right temporal-parietal region. No aneurysm or vascular malformation is detected at this level on the current exam. Given location of hemorrhage, question if this hemorrhage may be related to thrombosis of  the vein of Labbe.  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  CTA HEAD  Moderate to marked narrowing M1 segment left middle cerebral artery with decreased caliber of left middle cerebral artery branch vessels.  Calcified internal carotid artery cavernous segment with mild narrowing greater on the left.  CTA NECK  No hemodynamically  significant stenosis involving either carotid bifurcation.  Mild to slightly moderate narrowing proximal left vertebral artery which is the dominant vertebral artery. Moderate narrowing proximal right vertebral artery.  Please see above for further detail.  These results were called by telephone at the time of interpretation on 11/24/2014 at 4:45 pm to Dr. Ralene Bathe and Dr. Loralie Champagne, who verbally acknowledged these results.   Electronically Signed   By: Genia Del M.D.   On: 11/24/2014 17:14   Dg Chest Port 1 View  11/24/2014   CLINICAL DATA:  Headache for 1 day, history hypertension, diabetes, CHF, paroxysmal atrial fibrillation, LEFT ventricular assist device  EXAM: PORTABLE CHEST - 1 VIEW  COMPARISON:  Portable exam 1727 hours compared to 10/23/2014  FINDINGS: Post median sternotomy with LEFT ventricular assist device again seen.  LEFT subclavian AICD leads project over RIGHT atrium and RIGHT ventricle.  Minimal enlargement of cardiac silhouette.  Mediastinal contours and pulmonary vascularity normal.  Lungs clear.  No pleural effusion or pneumothorax.  Osseous structures stable.  IMPRESSION: Post LVAD and AICD.  No acute abnormalities.   Electronically Signed   By: Lavonia Dana M.D.   On: 11/24/2014 17:51     Medications:     Scheduled Medications: . sodium chloride  10 mL/hr Intravenous Once  . amiodarone  200 mg Oral Daily  . atorvastatin  20 mg Oral QHS  . docusate sodium  100 mg Oral BID  . hydrALAZINE  37.5 mg Oral 3 times per day  . isosorbide mononitrate  30 mg Oral Daily  . pantoprazole  40 mg Oral Daily    Infusions:    PRN Medications: acetaminophen, albuterol, bisacodyl, traMADol   Assessment:   1. Spontaneous intracerebral hemorrhage 2. Chronic systolic HF - Status post LVAD implantation for DT.  Volume status stable. No diuretics for now. Continue current dose of spironolactone, hydralazine, and imdur.  VAD parameters carefully reviewed. Increased PI events noted.  32 PI events 5/15; 50 PI events on 5/14; 25 PI events 5/13; 46 PI events 11/20/14.  - Check LDH, CMET, CBC,PT INR.  2. Anticoagulation management - Reversed with FFP. Holding all anti-coagulants now.  3. H/o Non Hodgkins Lymphoma s/p therapy 4. H/O CVA 2008: on left.  5. H/O VT/VF --[employer] Jude ICD 2010  6.DMII 7. PSVT- Continue amiodarone 200 mg daily.    Plan/Discussion:    ICH . Repeat CT this am. NSU following. No need for drain at this point.   Continue to hold all anticoagulation.   LVAD speed at 9000.    Long-term management of anticoagulation will be a challenge in setting of VAD and spontaneous ICH.   Keep in ICU. Appreciate Dr. Arnoldo Morale' care.   I reviewed the LVAD parameters from today, and compared the results to the patient's prior recorded data.  No programming changes were made.  The LVAD is functioning within specified parameters.  The patient performs LVAD self-test daily.  LVAD interrogation was negative for any significant power changes, alarms or PI events/speed drops.  LVAD equipment check completed and is in good working order.  Back-up equipment present.   LVAD education  done on emergency procedures and precautions and reviewed exit site care.    Length of Stay: 2  CLEGG,AMY NP-C  11/26/2014, 10:31 AM  VAD Team --- VAD ISSUES ONLY--- Pager [phone] (7am - 7am)  Advanced Heart Failure Team  Pager [phone] (M-F; 7a - 4p)  Please contact [facility] for night-coverage after hours (4p -7a ) and weekends on [email or web address]  Patient seen and examined with Darrick Grinder, NP. We discussed all aspects of the encounter. I agree with the assessment and plan as stated above.   Clinically stable although personality seems more labile. ICH reportedly smaller on CT this am. INR 1.2. Will hold AC one more day. Consider restart coumadin tomorrow with goal INR 1.5-2.0 if ok with NSU. Stop ASA.   VAD parameters stable.   Bensimhon, Daniel,MD 10:44 AM

## 2014-11-26 NOTE — Progress Notes (Signed)
Patient ID: Andre Tran, male   DOB: [birth year], 54 y.o.   MRN: [id number] Subjective:  The patient is alert and pleasant. He is in no apparent distress. He denies headaches. He looks well.  Objective: Vital signs in last 24 hours: Temp:  [98 F (36.7 C)-101.2 F (38.4 C)] 99.8 F (37.7 C) (05/18 1234) Pulse Rate:  [79-101] 81 (05/18 1400) Resp:  [14-34] 34 (05/18 1400) BP: (93-114)/(71-86) 98/77 mmHg (05/18 1400) SpO2:  [98 %-100 %] 100 % (05/18 0900) Weight:  [70.5 kg (155 lb 6.8 oz)] 70.5 kg (155 lb 6.8 oz) (05/18 0500)  Intake/Output from previous day: 05/17 0701 - 05/18 0700 In: 1140 [P.O.:1140] Out: 775 [Urine:775] Intake/Output this shift:    Physical exam the patient is alert and pleasant. He is oriented 3. He is moving all 4 extremities well. His speech is normal.  I have reviewed the patient's follow-up head CT performed today. It demonstrates no significant change in the patient's right temporal intracerebral hemorrhage.  Lab Results:  Recent Labs  11/24/14 1346 11/25/14 0220  WBC 7.9 8.9  HGB 10.2* 9.5*  HCT 31.4* 29.0*  PLT 261 258   BMET  Recent Labs  11/24/14 1346  NA 135  K 4.1  CL 103  CO2 24  GLUCOSE 99  BUN 18  CREATININE 1.11  CALCIUM 9.2    Studies/Results: Ct Angio Head W/cm &/or Wo Cm  11/24/2014   CLINICAL DATA:  54 year old male with right-sided headache since yesterday. Left ventricular assist device placement 11/19/2014. Initial encounter.  EXAM: CT ANGIOGRAPHY HEAD AND NECK  TECHNIQUE: Multidetector CT imaging of the head and neck was performed using the standard protocol during bolus administration of intravenous contrast. Multiplanar CT image reconstructions and MIPs were obtained to evaluate the vascular anatomy. Carotid stenosis measurements (when applicable) are obtained utilizing NASCET criteria, using the distal internal carotid diameter as the denominator.  CONTRAST:  80 cc Omnipaque 350.  COMPARISON:  None.  01/22/2013 neck  CT. 08/15/2007 head CT. Catheter cerebral angiogram 04/18/2007.  FINDINGS: Brain: Right temporal lobe 2.8 cm hematoma with surrounding vasogenic edema and breakthrough into the subarachnoid space with subarachnoid blood right temporal-parietal region. 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  Calvarium and skull base: Negative.  Paranasal sinuses: Negative.  Orbits: Negative.  CTA HEAD  Anterior circulation: No aneurysm or vascular malformation seen at the level of the right temporal lobe hematoma. Moderate to marked narrowing M1 segment left middle cerebral artery with decreased caliber of left middle cerebral artery branch vessels. Calcified internal carotid artery cavernous segment with mild narrowing greater on the left.  Posterior circulation: Left vertebral artery is dominant. Narrowing of the right vertebral artery after takeoff of the posterior inferior cerebellar artery. Slight irregularity and narrowing of the basilar artery without high-grade stenosis.  Venous sinuses: Major dural sinuses are patent.  Anatomic variants: None.  Delayed phase:No significant abnormality.  CTA NECK  Aortic arch: 3 vessel aortic arch with close origin of the innominate and left common carotid artery.  Right carotid system: Mild plaque carotid bifurcation/proximal right internal carotid artery without significant narrowing.  Left carotid system: No significant narrowing.  Vertebral arteries:Mild to slightly moderate narrowing proximal left vertebral artery which is the dominant vertebral artery. Moderate narrowing proximal right vertebral artery.  Skeleton: Cervical spondylotic changes most notable C4-5.  Other neck: Left pleural effusion.  IMPRESSION: Right temporal lobe 2.8 cm hematoma with surrounding vasogenic edema and breakthrough into the  subarachnoid space with subarachnoid blood right temporal-parietal region. No aneurysm or vascular malformation is  detected at this level on the current exam. Given location of hemorrhage, question if this hemorrhage may be related to thrombosis of the vein of Labbe.  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  CTA HEAD  Moderate to marked narrowing M1 segment left middle cerebral artery with decreased caliber of left middle cerebral artery branch vessels.  Calcified internal carotid artery cavernous segment with mild narrowing greater on the left.  CTA NECK  No hemodynamically significant stenosis involving either carotid bifurcation.  Mild to slightly moderate narrowing proximal left vertebral artery which is the dominant vertebral artery. Moderate narrowing proximal right vertebral artery.  Please see above for further detail.  These results were called by telephone at the time of interpretation on 11/24/2014 at 4:45 pm to Dr. Ralene Bathe and Dr. Loralie Champagne, who verbally acknowledged these results.   Electronically Signed   By: Genia Del M.D.   On: 11/24/2014 17:14   Ct Head Wo Contrast  11/26/2014   CLINICAL DATA:  54 year old male with acute intracranial hemorrhage. Right side headache. On Coumadin. Prior stroke. Initial encounter.  EXAM: CT HEAD WITHOUT CONTRAST  TECHNIQUE: Contiguous axial images were obtained from the base of the skull through the vertex without intravenous contrast.  COMPARISON:  11/25/2014, 11/24/2014, and earlier.  FINDINGS: Visualized osseous structures remain normal. Visualized paranasal sinuses and mastoids are clear. Visualized orbit soft tissues are within normal limits. No acute scalp soft tissue findings.  Calcified atherosclerosis at the skull base.  No ventriculomegaly.  Heterogeneous hyperdense intra-axial hemorrhage centered at the posterior inferior right temporal lobe re- identified. Stable size and configuration since yesterday, 32 x 25 mm (estimated hemorrhage volume 12 mL). Surrounding temporal lobe edema is stable.  Mild extension into the subarachnoid space appears stable. Effaced right lateral ventricle. No intraventricular extension identified. Mild mass effect at the right uncus and ambient cistern is stable. Other basilar cisterns remain patent.  Left MCA territory encephalomalacia with ex vacuo enlargement of the left ventricle re- identified. Trace leftward midline shift. No new intracranial hemorrhage. No new cortically based infarct.  IMPRESSION: 1. Stable right temporal lobe intra-axial hemorrhage with mild subarachnoid extension since yesterday. Stable mild intracranial mass effect. 2. No new intracranial abnormality. 3. Chronic left hemisphere ischemia.   Electronically Signed   By: Genevie Ann M.D.   On: 11/26/2014 10:30   Ct Head Wo Contrast  11/25/2014   CLINICAL DATA:  Follow-up intracranial hemorrhage. History of pacemaker. History of CHF, diabetes, lymphoma, hypertension.  EXAM: CT HEAD WITHOUT CONTRAST  TECHNIQUE: Contiguous axial images were obtained from the base of the skull through the vertex without intravenous contrast.  COMPARISON:  CT of the head Nov 24, 2014  FINDINGS: RIGHT posterior temporal lobe 3.2 x 2.5 cm intraparenchymal hematoma was 2.6 x 2 cm. Surrounding low-density vasogenic edema and local sulcal effacement without midline shift. Mildly effaced RIGHT occipital horn. However, there is LEFT cerebral volume loss due to large cystic LEFT basal ganglia lacunar infarct with ex vacuo dilatation LEFT lateral ventricle. No hydrocephalus. No acute large vascular territory infarct. Mild white matter changes suggest chronic small vessel ischemic disease.  Mildly effaced RIGHT basal cistern. Minimal cortical venous congestion versus trace subarachnoid hemorrhage RIGHT middle cranial fossa.  Ocular globes and orbital contents are unremarkable unremarkable. Visualized paranasal sinuses and mastoid air cells are well aerated. No skull fracture.  IMPRESSION: Enlarging RIGHT  posterior temporal lobe  hematomas 3.2 cm, previously 2.6 cm with similar local mass-effect. Trace RIGHT middle cranial fossa subarachnoid blood versus cortical venous congestion.  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   Electronically Signed   By: Elon Alas   On: 11/25/2014 06:07   Ct Angio Neck W/cm &/or Wo/cm  11/24/2014   CLINICAL DATA:  54 year old male with right-sided headache since yesterday. Left ventricular assist device placement 11/19/2014. Initial encounter.  EXAM: CT ANGIOGRAPHY HEAD AND NECK  TECHNIQUE: Multidetector CT imaging of the head and neck was performed using the standard protocol during bolus administration of intravenous contrast. Multiplanar CT image reconstructions and MIPs were obtained to evaluate the vascular anatomy. Carotid stenosis measurements (when applicable) are obtained utilizing NASCET criteria, using the distal internal carotid diameter as the denominator.  CONTRAST:  80 cc Omnipaque 350.  COMPARISON:  None.  01/22/2013 neck CT. 08/15/2007 head CT. Catheter cerebral angiogram 04/18/2007.  FINDINGS: Brain: Right temporal lobe 2.8 cm hematoma with surrounding vasogenic edema and breakthrough into the subarachnoid space with subarachnoid blood right temporal-parietal region. 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  Calvarium and skull base: Negative.  Paranasal sinuses: Negative.  Orbits: Negative.  CTA HEAD  Anterior circulation: No aneurysm or vascular malformation seen at the level of the right temporal lobe hematoma. Moderate to marked narrowing M1 segment left middle cerebral artery with decreased caliber of left middle cerebral artery branch vessels. Calcified internal carotid artery cavernous segment with mild narrowing greater on the left.  Posterior circulation: Left vertebral artery is dominant. Narrowing of the right vertebral artery after takeoff of the posterior inferior cerebellar artery.  Slight irregularity and narrowing of the basilar artery without high-grade stenosis.  Venous sinuses: Major dural sinuses are patent.  Anatomic variants: None.  Delayed phase:No significant abnormality.  CTA NECK  Aortic arch: 3 vessel aortic arch with close origin of the innominate and left common carotid artery.  Right carotid system: Mild plaque carotid bifurcation/proximal right internal carotid artery without significant narrowing.  Left carotid system: No significant narrowing.  Vertebral arteries:Mild to slightly moderate narrowing proximal left vertebral artery which is the dominant vertebral artery. Moderate narrowing proximal right vertebral artery.  Skeleton: Cervical spondylotic changes most notable C4-5.  Other neck: Left pleural effusion.  IMPRESSION: Right temporal lobe 2.8 cm hematoma with surrounding vasogenic edema and breakthrough into the subarachnoid space with subarachnoid blood right temporal-parietal region. No aneurysm or vascular malformation is detected at this level on the current exam. Given location of hemorrhage, question if this hemorrhage may be related to thrombosis of the vein of Labbe.  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  CTA HEAD  Moderate to marked narrowing M1 segment left middle cerebral artery with decreased caliber of left middle cerebral artery branch vessels.  Calcified internal carotid artery cavernous segment with mild narrowing greater on the left.  CTA NECK  No hemodynamically significant stenosis involving either carotid bifurcation.  Mild to slightly moderate narrowing proximal left vertebral artery which is the dominant vertebral artery. Moderate narrowing proximal right vertebral artery.  Please see above for further detail.  These results were called by telephone at the time of interpretation on 11/24/2014 at 4:45 pm to Dr. Ralene Bathe and Dr. Loralie Champagne, who verbally acknowledged these results.    Electronically Signed   By: Genia Del M.D.   On: 11/24/2014 17:14   Dg Chest [facility]  1 View  11/24/2014   CLINICAL DATA:  Headache for 1 day, history hypertension, diabetes, CHF, paroxysmal atrial fibrillation, LEFT ventricular assist device  EXAM: PORTABLE CHEST - 1 VIEW  COMPARISON:  Portable exam 1727 hours compared to 10/23/2014  FINDINGS: Post median sternotomy with LEFT ventricular assist device again seen.  LEFT subclavian AICD leads project over RIGHT atrium and RIGHT ventricle.  Minimal enlargement of cardiac silhouette.  Mediastinal contours and pulmonary vascularity normal.  Lungs clear.  No pleural effusion or pneumothorax.  Osseous structures stable.  IMPRESSION: Post LVAD and AICD.  No acute abnormalities.   Electronically Signed   By: Lavonia Dana M.D.   On: 11/24/2014 17:51    Assessment/Plan: Spontaneous Right temporal intracerebral hemorrhage, subarachnoid hemorrhage: The patient is doing well neurologically. I think he will be okay to re-anticoagulate him tomorrow if he remains clinically stable. Anticoagulation of course is not without risk but with his LVAD there is certainly risk not anticoagulating him.  LOS: 2 days     Aslee Such D 11/26/2014, 2:50 PM

## 2014-11-26 NOTE — Progress Notes (Signed)
Admitted 11/24/14 due to [place].Marland Kitchen    HeartMate II LVAD implated on 10/13/14 by Dr. Cyndia Bent as Destination Therapy VAD.   Vital signs: Temp: 101.2 - 100.3 - 99.8 and 98 today HR: 89 Automated BP:  95 - 114 / 75 - 86 with MAPs 80 - 93 O2 Sat: 100 % on RA Wt:  155 lbs   LVAD interrogation reveals:  Speed:  9000 Flow:  5.9 Power:  6.3 PI:  5.6 Alarms: none Events:  Few PI events early this morning around 0230 lowest 4.8 Fixed speed:  9000 Low speed limit: 8400  Drive Line:   Dressing dry and intact with attachment device in place; dressing being changed weekly per sterile technique using weekly dressing kit.  Labs:  LDH trend:  479 > 341 > 358 > 428  INR trend:  2.09 > 3.06 > 1.67 > 1.44 > 1.25  Hgb trend:  11.5>10.2>9.5  Plan/Recommendations as discussed with team: 1. Repeat head CT showed some improvement in ICH. Per Dr. Haroldine Laws will consider restarting warfarin without ASA tomorrow for INR goal of 1.5 - 2.0. LDHs trending up without spikes in powers or HF symptoms. Urine reported to be clear amber yellow.   2. Apparently he got quite upset with lab this morning and having had been woken up frequently last PM although night nurse reports he slept well. To me he told me he slept well all night despite one headache of which resolved with pain medication.

## 2014-11-27 LAB — LACTATE DEHYDROGENASE: LDH: 390 U/L — ABNORMAL HIGH (ref 98–192)

## 2014-11-27 LAB — PROTIME-INR
INR: 1.27 (ref 0.00–1.49)
PROTHROMBIN TIME: 16 s — AB (ref 11.6–15.2)

## 2014-11-27 MED ORDER — WARFARIN - PHARMACIST DOSING INPATIENT
Freq: Every day | Status: DC
Start: 2014-11-27 — End: 2014-11-28

## 2014-11-27 MED ORDER — WARFARIN SODIUM 5 MG PO TABS
5.0000 mg | ORAL_TABLET | Freq: Once | ORAL | Status: AC
  Administered 2014-11-27: 5 mg via ORAL
  Filled 2014-11-27: qty 1

## 2014-11-27 MED ORDER — HYDRALAZINE HCL 50 MG PO TABS
50.0000 mg | ORAL_TABLET | Freq: Three times a day (TID) | ORAL | Status: DC
  Administered 2014-11-27 – 2014-11-28 (×4): 50 mg via ORAL
  Filled 2014-11-27 (×6): qty 1

## 2014-11-27 NOTE — Progress Notes (Signed)
HeartMate 2 Rounding Note  Subjective:     Andre Tran is a 54 y.o. 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 Underwent HMII LVAD placement 10/13/2014   Admitted April 1st 2016 for schedule HMII implant. Post implant he was taken back to the OR for bleeding otherwise his post operative course was uneventful. LVAD speed turned down to 9000 on the day of discharge. Discharge weight was 160 pounds.   Admitted 5/16 with headache and CT showed spontaneous intracerebral bleed. NSU following. INR 1.4 aft vit k 5 and 2 u FFP. CT yesterday with stable RIGHT posterior temporal lobe ICH  Denies HA or other focal neuro sx. Denies SOB. Got IVF last night. Also hydralazine for elevated MAP.   INR 1.27 .   LVAD INTERROGATION:  HeartMate II LVAD: Flow 5.1 liters/min, speed 9000, power 6.4, PI 6.4   Objective:    Vital Signs:   Temp:  [98.4 F (36.9 C)-100.2 F (37.9 C)] 98.5 F (36.9 C) (05/19 0700) Pulse Rate:  [81-101] 86 (05/19 0800) Resp:  [14-38] 16 (05/19 0800) BP: (91-104)/(73-81) 104/78 mmHg (05/18 1900) SpO2:  [97 %-100 %] 100 % (05/19 0800) Weight:  [150 lb (68.04 kg)] 150 lb (68.04 kg) (05/19 0500)   Mean arterial Pressure  80s   Intake/Output:   Intake/Output Summary (Last 24 hours) at 11/27/14 0812 Last data filed at 11/26/14 2200  Gross per 24 hour  Intake    740 ml  Output    300 ml  Net    440 ml     Physical Exam: General: Well appearing. No resp difficulty. Sitting in the chair HEENT: normal Neck: supple. JVP flat Carotids 2+ bilat; no bruits. No lymphadenopathy or thryomegaly appreciated. Cor: Mechanical heart sounds with LVAD hum  present. Lungs: clear Abdomen: soft, nontender, nondistended. No hepatosplenomegaly. No bruits or masses. Good bowel sounds. Driveline: C/D/I; securement device intact and driveline incorporated Extremities: no cyanosis, clubbing, rash, edema RUE weak  R and LLE SCDs.  Neuro: alert & orientedx3, cranial nerves grossly intact. moves all 4 extremities w/o difficulty. Right-sided mild weakness (chronic from old CVA).   Telemetry: NSR  Labs: Basic Metabolic Panel:  Recent Labs Lab 11/20/14 1350 11/24/14 1346 11/25/14 0220  NA 135 135  --   K 4.8 4.1  --   CL 101 103  --   CO2 22 24  --   GLUCOSE 146* 99  --   BUN 21* 18  --   CREATININE 1.26* 1.11  --   CALCIUM 9.6 9.2  --   MG  --   --  1.9    Liver Function Tests: No results for input(s): AST, ALT, ALKPHOS, BILITOT, PROT, ALBUMIN in the last 168 hours. No results for input(s): LIPASE, AMYLASE in the last 168 hours. No results for input(s): AMMONIA in the last 168 hours.  CBC:  Recent Labs Lab 11/20/14 1350 11/24/14 1346 11/25/14 0220  WBC 10.5 7.9 8.9  NEUTROABS  --  5.6 7.3  HGB 11.5* 10.2* 9.5*  HCT 35.2* 31.4* 29.0*  MCV 87.1 87.5 86.8  PLT 303 261 258    INR:  Recent Labs Lab 11/24/14 1346 11/24/14 2113 11/25/14 0220 11/26/14 0238 11/27/14 0315  INR 3.06* 1.67* 1.44 1.25 1.27    Other results:    Imaging: Ct Head Wo Contrast  11/26/2014   CLINICAL DATA:  54 year old male with acute intracranial hemorrhage. Right side headache. On Coumadin. Prior stroke. Initial encounter.  EXAM: CT HEAD WITHOUT CONTRAST  TECHNIQUE: Contiguous axial images were obtained from the base of the skull through the vertex without intravenous contrast.  COMPARISON:  11/25/2014, 11/24/2014, and earlier.  FINDINGS: Visualized osseous structures remain normal. Visualized paranasal sinuses and mastoids are clear. Visualized orbit soft tissues are within normal limits. No acute scalp soft tissue findings.  Calcified  atherosclerosis at the skull base.  No ventriculomegaly.  Heterogeneous hyperdense intra-axial hemorrhage centered at the posterior inferior right temporal lobe re- identified. Stable size and configuration since yesterday, 32 x 25 mm (estimated hemorrhage volume 12 mL). Surrounding temporal lobe edema is stable. Mild extension into the subarachnoid space appears stable. Effaced right lateral ventricle. No intraventricular extension identified. Mild mass effect at the right uncus and ambient cistern is stable. Other basilar cisterns remain patent.  Left MCA territory encephalomalacia with ex vacuo enlargement of the left ventricle re- identified. Trace leftward midline shift. No new intracranial hemorrhage. No new cortically based infarct.  IMPRESSION: 1. Stable right temporal lobe intra-axial hemorrhage with mild subarachnoid extension since yesterday. Stable mild intracranial mass effect. 2. No new intracranial abnormality. 3. Chronic left hemisphere ischemia.   Electronically Signed   By: Andre Tran M.D.   On: 11/26/2014 10:30     Medications:     Scheduled Medications: . sodium chloride  10 mL/hr Intravenous Once  . amiodarone  200 mg Oral Daily  . atorvastatin  20 mg Oral QHS  . docusate sodium  100 mg Oral BID  . hydrALAZINE  37.5 mg Oral 3 times per day  . isosorbide mononitrate  30 mg Oral Daily  . pantoprazole  40 mg Oral Daily    Infusions:    PRN Medications: acetaminophen, albuterol, bisacodyl, hydrALAZINE, traMADol   Assessment:   1. Spontaneous intracerebral hemorrhage 2. Chronic systolic HF - Status post LVAD implantation for DT.  Volume status stable. No diuretics for now. Continue current dose of spironolactone, hydralazine, and imdur.  VAD parameters carefully reviewed. Increased PI events noted. 32 PI events 5/15; 50 PI events on 5/14; 25 PI events 5/13; 46 PI events 11/20/14.  - Check LDH, CMET, CBC,PT INR.  2. Anticoagulation management - Reversed with FFP.  Holding all anti-coagulants now.  3. H/o Non Hodgkins Lymphoma s/p therapy 4. H/O CVA 2008: on left.  5. H/O VT/VF --St Jude ICD 2010  6.DMII 7. PSVT- Continue amiodarone 200 mg daily.    Plan/Discussion:    ICH .  NSU following.   Per neurosurgery able to restart coumadin today. INR 1.2    LVAD speed at 9000.  LDH 428>390   Long-term management of anticoagulation will be a challenge in setting of VAD and spontaneous ICH.    Appreciate Dr. Arnoldo Morale' care.   I reviewed the LVAD parameters from today, and compared the results to the patient's prior recorded data.  No programming changes were made.  The LVAD is functioning within specified parameters.  The patient performs LVAD self-test daily.  LVAD interrogation was negative for any significant power changes, alarms or PI events/speed drops.  LVAD equipment check completed and is in good working order.  Back-up equipment present.   LVAD education done on emergency procedures and precautions and reviewed exit site care.  Transfer to stepdown.  Length of Stay: 3 Andre Tran,AMY NP-C  11/27/2014, 8:12 AM  VAD Team --- VAD ISSUES ONLY--- Pager [phone] (7am - 7am) Advanced Heart Failure Team  Pager [phone] (M-F; 7a - 4p)  Please contact [facility] for night-coverage after hours (4p -7a ) and weekends on [email or web address]  Patient seen and examined with Darrick Grinder, NP. We discussed all aspects of the encounter. I agree with the assessment and plan as stated above.   Stable from [place] standpoint. CT reviewed. Will restart coumadin slowly with INR goal 1.7-2.0. No ASA. Increase hydralazine to keep MAR 70-80 range. Encourage po intake. Continue ambulation. VAD parameters stable.   Bensimhon, Daniel,MD 8:28 AM

## 2014-11-27 NOTE — Care Management Note (Signed)
Case Management Note  Patient Details  Name: Andre Tran MRN: [id number] Date of Birth: 14-Sep-1960  Subjective/Objective:                  Patient lives at home with wife.  Independent - no deficits from [place].   Action/Plan: CM will continue to follow.   Expected Discharge Date:                  Expected Discharge Plan:  Home/Self Care  In-House Referral:     Discharge planning Services  CM Consult  Post Acute Care Choice:    Choice offered to:     DME Arranged:    DME Agency:     HH Arranged:    HH Agency:     Status of Service:  In process, will continue to follow  Medicare Important Message Given:    Date Medicare IM Given:    Medicare IM give by:    Date Additional Medicare IM Given:    Additional Medicare Important Message give by:     If discussed at [place] of Stay Meetings, dates discussed:    Additional Comments:  Vergie Living, RN 11/27/2014, 11:36 AM

## 2014-11-27 NOTE — Progress Notes (Signed)
Patient ID: Andre Tran, male   DOB: Feb 12, 1961, 54 y.o.   MRN: [id number] Subjective:  The patient is alert and pleasant. He is in no apparent distress.  Objective: Vital signs in last 24 hours: Temp:  [98.4 F (36.9 C)-99.5 F (37.5 C)] 98.7 F (37.1 C) (05/19 1500) Pulse Rate:  [66-101] 94 (05/19 1900) Resp:  [14-38] 19 (05/19 1900) SpO2:  [99 %-100 %] 100 % (05/19 1900) Weight:  [68.04 kg (150 lb)] 68.04 kg (150 lb) (05/19 0500)  Intake/Output from previous day: 05/18 0701 - 05/19 0700 In: 740 [P.O.:240; IV Piggyback:500] Out: 300 [Urine:300] Intake/Output this shift:    Physical exam the patient is alert and oriented 3. His pupils are equal. His speech is normal. His strength is normal.  Lab Results:  Recent Labs  11/25/14 0220  WBC 8.9  HGB 9.5*  HCT 29.0*  PLT 258   BMET No results for input(s): NA, K, CL, CO2, GLUCOSE, BUN, CREATININE, CALCIUM in the last 72 hours.  Studies/Results: Ct Head Wo Contrast  11/26/2014   CLINICAL DATA:  54 year old male with acute intracranial hemorrhage. Right side headache. On Coumadin. Prior stroke. Initial encounter.  EXAM: CT HEAD WITHOUT CONTRAST  TECHNIQUE: Contiguous axial images were obtained from the base of the skull through the vertex without intravenous contrast.  COMPARISON:  11/25/2014, 11/24/2014, and earlier.  FINDINGS: Visualized osseous structures remain normal. Visualized paranasal sinuses and mastoids are clear. Visualized orbit soft tissues are within normal limits. No acute scalp soft tissue findings.  Calcified atherosclerosis at the skull base.  No ventriculomegaly.  Heterogeneous hyperdense intra-axial hemorrhage centered at the posterior inferior right temporal lobe re- identified. Stable size and configuration since yesterday, 32 x 25 mm (estimated hemorrhage volume 12 mL). Surrounding temporal lobe edema is stable. Mild extension into the subarachnoid space appears stable. Effaced right lateral ventricle. No  intraventricular extension identified. Mild mass effect at the right uncus and ambient cistern is stable. Other basilar cisterns remain patent.  Left MCA territory encephalomalacia with ex vacuo enlargement of the left ventricle re- identified. Trace leftward midline shift. No new intracranial hemorrhage. No new cortically based infarct.  IMPRESSION: 1. Stable right temporal lobe intra-axial hemorrhage with mild subarachnoid extension since yesterday. Stable mild intracranial mass effect. 2. No new intracranial abnormality. 3. Chronic left hemisphere ischemia.   Electronically Signed   By: Genevie Ann M.D.   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 His Coumadin has been restarted. I will continue to follow him clinically.  LOS: 3 days     Alisa Stjames D 11/27/2014, 7:05 PM

## 2014-11-27 NOTE — Progress Notes (Addendum)
ANTICOAGULATION CONSULT NOTE   Pharmacy Consult for coumadin  Indication: LVAD  No Known Allergies  Patient Measurements: Height: 5\' 7"  (170.2 cm) Weight: 150 lb (68.04 kg) IBW/kg (Calculated) : 66.1   Vital Signs: Temp: 98.5 F (36.9 C) (05/19 0700) Temp Source: Oral (05/19 0700) Pulse Rate: 81 (05/19 1100)  Labs:  Recent Labs  11/24/14 1346  11/25/14 0220 11/26/14 0238 11/27/14 0315  HGB 10.2*  --  9.5*  --   --   HCT 31.4*  --  29.0*  --   --   PLT 261  --  258  --   --   LABPROT 31.9*  < > 17.7* 15.8* 16.0*  INR 3.06*  < > 1.44 1.25 1.27  CREATININE 1.11  --   --   --   --   < > = values in this interval not displayed.  Estimated Creatinine Clearance: 73.6 mL/min (by C-G formula based on Cr of 1.11).   Medical History: Past Medical History  Diagnosis Date  . Paroxysmal ventricular tachycardia 2005, 2010    s/p AICD '05, replaced w/ St. Jude's in 2010 (PVT/V.Fib arrest requiring ICD implant in 2005)  . HYPERTENSION, UNSPECIFIED   . HYPERLIPIDEMIA-MIXED   . 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 Nonischemic cardiomyopathy     2/2 Adriamycin administration for lymphoma  . CHF (congestive heart failure)     EF 15% by echo 2009; s/p St. Jude ICD  . Diabetes mellitus     Type 2  . Cancer 1992, 2007     non hodkins lymphoma 1992, Hodgkins 2007  . Depression   . LV (left ventricular) mural thrombus 2009    2009, on coumadin  . Small bowel obstruction 2001    s/p Small bowel resection 2001  . Jejunal intussusception 2008    2008  . 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 Splenic mass 2009    Noted on Abd [place] 2009 w/ recs for f/u CT - not done  . Hypotension   . Noncompliance with medication regimen   . AICD (automatic cardioverter/defibrillator) present 2005, 2010  . Presence of permanent cardiac pacemaker     Assessment: 54 y.o. 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 Underwent HMII LVAD placement 10/13/2014   INR 1.2>>1.5 overnight - ok per neurology to restart warfarin 5/19, will not bridge with heparin and will be conservative with warfarin dosing given spontaneous ICH. Cardiology wants very tight low INR goal of 1.7-2.  With bump in INR overnight will drop coumadin dose to 2.5mg  daily over weekend and have close follow up INR on Monday.  PTA warfarin dose was 7.5mg  daily except 5mg  MWF.   Goal of Therapy:  INR goal 1.7-2 per Bensimhon Monitor platelets by anticoagulation protocol: Yes   Plan:  Warfarin 2.5mg  through weekend then follow up on monday Daily INR  Erin Hearing PharmD., BCPS Clinical Pharmacist Pager [phone] 11/27/2014 11:46 AM

## 2014-11-28 LAB — CBC
HEMATOCRIT: 29.8 % — AB (ref 39.0–52.0)
Hemoglobin: 9.9 g/dL — ABNORMAL LOW (ref 13.0–17.0)
MCH: 28.8 pg (ref 26.0–34.0)
MCHC: 33.2 g/dL (ref 30.0–36.0)
MCV: 86.6 fL (ref 78.0–100.0)
Platelets: 242 10*3/uL (ref 150–400)
RBC: 3.44 MIL/uL — ABNORMAL LOW (ref 4.22–5.81)
RDW: 14.6 % (ref 11.5–15.5)
WBC: 7.7 10*3/uL (ref 4.0–10.5)

## 2014-11-28 LAB — BASIC METABOLIC PANEL
Anion gap: 7 (ref 5–15)
BUN: 13 mg/dL (ref 6–20)
CALCIUM: 9.1 mg/dL (ref 8.9–10.3)
CO2: 25 mmol/L (ref 22–32)
Chloride: 100 mmol/L — ABNORMAL LOW (ref 101–111)
Creatinine, Ser: 0.97 mg/dL (ref 0.61–1.24)
GFR calc Af Amer: >60 mL/min (ref >60)
GLUCOSE: 144 mg/dL — AB (ref 65–99)
Potassium: 3.5 mmol/L (ref 3.5–5.1)
Sodium: 132 mmol/L — ABNORMAL LOW (ref 135–145)

## 2014-11-28 LAB — PROTIME-INR
INR: 1.52 — ABNORMAL HIGH (ref 0.00–1.49)
PROTHROMBIN TIME: 18.4 s — AB (ref 11.6–15.2)

## 2014-11-28 LAB — LACTATE DEHYDROGENASE: LDH: 346 U/L — ABNORMAL HIGH (ref 98–192)

## 2014-11-28 MED ORDER — WARFARIN SODIUM 2.5 MG PO TABS
2.5000 mg | ORAL_TABLET | Freq: Every day | ORAL | Status: DC
  Administered 2014-11-28: 2.5 mg via ORAL
  Filled 2014-11-28: qty 1

## 2014-11-28 MED ORDER — HYDRALAZINE HCL 25 MG PO TABS: 50.0000 mg | ORAL_TABLET | Freq: Three times a day (TID) | ORAL | Status: DC

## 2014-11-28 MED ORDER — WARFARIN SODIUM 5 MG PO TABS: 2.5000 mg | ORAL_TABLET | Freq: Once | ORAL | Status: DC

## 2014-11-28 NOTE — Progress Notes (Signed)
Patient ID: Andre Tran, male   DOB: 1961-03-12, 54 y.o.   MRN: [id number] Subjective: The patient is alert and pleasant. He has no complaints. He wants to go home.  Objective: Vital signs in last 24 hours: Temp:  [98.5 F (36.9 C)-99.5 F (37.5 C)] 98.5 F (36.9 C) (05/20 0400) Pulse Rate:  [66-98] 93 (05/20 0600) Resp:  [15-22] 15 (05/20 0600) SpO2:  [99 %-100 %] 100 % (05/20 0600) Weight:  [67.4 kg (148 lb 9.4 oz)] 67.4 kg (148 lb 9.4 oz) (05/20 0500)  Intake/Output from previous day: 05/19 0701 - 05/20 0700 In: 600 [P.O.:600] Out: 615 [Urine:615] Intake/Output this shift:    Physical exam the patient is alert and oriented 3. His speech is normal. Strength is normal. His pupils are equal.  Lab Results:  Recent Labs  11/28/14 0231  WBC 7.7  HGB 9.9*  HCT 29.8*  PLT 242   BMET  Recent Labs  11/28/14 0231  NA 132*  K 3.5  CL 100*  CO2 25  GLUCOSE 144*  BUN 13  CREATININE 0.97  CALCIUM 9.1    Studies/Results: Ct Head Wo Contrast  11/26/2014   CLINICAL DATA:  54 year old male with acute intracranial hemorrhage. Right side headache. On Coumadin. Prior stroke. Initial encounter.  EXAM: CT HEAD WITHOUT CONTRAST  TECHNIQUE: Contiguous axial images were obtained from the base of the skull through the vertex without intravenous contrast.  COMPARISON:  11/25/2014, 11/24/2014, and earlier.  FINDINGS: Visualized osseous structures remain normal. Visualized paranasal sinuses and mastoids are clear. Visualized orbit soft tissues are within normal limits. No acute scalp soft tissue findings.  Calcified atherosclerosis at the skull base.  No ventriculomegaly.  Heterogeneous hyperdense intra-axial hemorrhage centered at the posterior inferior right temporal lobe re- identified. Stable size and configuration since yesterday, 32 x 25 mm (estimated hemorrhage volume 12 mL). Surrounding temporal lobe edema is stable. Mild extension into the subarachnoid space appears stable. Effaced  right lateral ventricle. No intraventricular extension identified. Mild mass effect at the right uncus and ambient cistern is stable. Other basilar cisterns remain patent.  Left MCA territory encephalomalacia with ex vacuo enlargement of the left ventricle re- identified. Trace leftward midline shift. No new intracranial hemorrhage. No new cortically based infarct.  IMPRESSION: 1. Stable right temporal lobe intra-axial hemorrhage with mild subarachnoid extension since yesterday. Stable mild intracranial mass effect. 2. No new intracranial abnormality. 3. Chronic left hemisphere ischemia.   Electronically Signed   By: Genevie Ann M.D.   On: 11/26/2014 10:30    Assessment/Plan: Right temporal intracerebral hemorrhage: The patient is stable clinically and radiographically. His Coumadin has been restarted. From my point of view it's okay to be discharged and follow-up with me in a week in the office for a follow-up head CT. Please call if I can be of further assistance.  LOS: 4 days     Nillie Bartolotta D 11/28/2014, 7:30 AM

## 2014-11-28 NOTE — Progress Notes (Signed)
HeartMate 2 Rounding Note  Subjective:     Mr Welles is a 54 y.o. 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 Underwent HMII LVAD placement 10/13/2014   Admitted April 1st 2016 for schedule HMII implant. Post implant he was taken back to the OR for bleeding otherwise his post operative course was uneventful. LVAD speed turned down to 9000 on the day of discharge. Discharge weight was 160 pounds.   Admitted 5/16 with headache and CT showed spontaneous intracerebral bleed. NSU following. INR 1.4 aft vit k 5 and 2 u FFP. CT yesterday with stable RIGHT posterior temporal lobe ICH  Denies HA or other focal neuro sx. Denies SOB. Wants go home.   INR 1.27>1.5  .   LVAD INTERROGATION:  HeartMate II LVAD: Flow 4.8 liters/min, speed 9000, power 5, PI 5.7    Objective:    Vital Signs:   Temp:  [98.4 F (36.9 C)-99.5 F (37.5 C)] 98.4 F (36.9 C) (05/20 0815) Pulse Rate:  [66-98] 77 (05/20 0800) Resp:  [15-22] 15 (05/20 0800) SpO2:  [99 %-100 %] 100 % (05/20 0800) Weight:  [148 lb 9.4 oz (67.4 kg)] 148 lb 9.4 oz (67.4 kg) (05/20 0500) Last BM Date: 11/26/14 Mean arterial Pressure  80s   Intake/Output:   Intake/Output Summary (Last 24 hours) at 11/28/14 0835 Last data filed at 11/28/14 0500  Gross per 24 hour  Intake    600 ml  Output    615 ml  Net    -15 ml     Physical Exam: General: Well appearing. No resp difficulty. Sitting in the chair HEENT: normal Neck: supple. JVP flat Carotids 2+ bilat; no bruits. No lymphadenopathy or thryomegaly appreciated. Cor: Mechanical heart sounds with LVAD hum present. Lungs: clear Abdomen: soft, nontender, nondistended.  No hepatosplenomegaly. No bruits or masses. Good bowel sounds. Driveline: C/D/I; securement device intact and driveline incorporated Extremities: no cyanosis, clubbing, rash, edema RUE weak  R and LLE SCDs.  Neuro: alert & orientedx3, cranial nerves grossly intact. moves all 4 extremities w/o difficulty. Right-sided mild weakness (chronic from old CVA).   Telemetry: NSR  Labs: Basic Metabolic Panel:  Recent Labs Lab 11/24/14 1346 11/25/14 0220 11/28/14 0231  NA 135  --  132*  K 4.1  --  3.5  CL 103  --  100*  CO2 24  --  25  GLUCOSE 99  --  144*  BUN 18  --  13  CREATININE 1.11  --  0.97  CALCIUM 9.2  --  9.1  MG  --  1.9  --     Liver Function Tests: No results for input(s): AST, ALT, ALKPHOS, BILITOT, PROT, ALBUMIN in the last 168 hours. No results for input(s): LIPASE, AMYLASE in the last 168 hours. No results for input(s): AMMONIA in the last 168 hours.  CBC:  Recent Labs Lab 11/24/14 1346 11/25/14 0220 11/28/14 0231  WBC 7.9 8.9 7.7  NEUTROABS 5.6 7.3  --   HGB 10.2* 9.5* 9.9*  HCT 31.4* 29.0* 29.8*  MCV 87.5 86.8 86.6  PLT 261 258 242    INR:  Recent Labs Lab 11/24/14 2113 11/25/14 0220 11/26/14 0238 11/27/14 0315 11/28/14 0231  INR 1.67* 1.44  1.25 1.27 1.52*    Other results:    Imaging: Ct Head Wo Contrast  11/26/2014   CLINICAL DATA:  54 year old male with acute intracranial hemorrhage. Right side headache. On Coumadin. Prior stroke. Initial encounter.  EXAM: CT HEAD WITHOUT CONTRAST  TECHNIQUE: Contiguous axial images were obtained from the base of the skull through the vertex without intravenous contrast.  COMPARISON:  11/25/2014, 11/24/2014, and earlier.  FINDINGS: Visualized osseous structures remain normal. Visualized paranasal sinuses and mastoids are clear. Visualized orbit soft tissues are within normal limits. No acute scalp soft tissue findings.  Calcified atherosclerosis at the skull base.  No ventriculomegaly.  Heterogeneous  hyperdense intra-axial hemorrhage centered at the posterior inferior right temporal lobe re- identified. Stable size and configuration since yesterday, 32 x 25 mm (estimated hemorrhage volume 12 mL). Surrounding temporal lobe edema is stable. Mild extension into the subarachnoid space appears stable. Effaced right lateral ventricle. No intraventricular extension identified. Mild mass effect at the right uncus and ambient cistern is stable. Other basilar cisterns remain patent.  Left MCA territory encephalomalacia with ex vacuo enlargement of the left ventricle re- identified. Trace leftward midline shift. No new intracranial hemorrhage. No new cortically based infarct.  IMPRESSION: 1. Stable right temporal lobe intra-axial hemorrhage with mild subarachnoid extension since yesterday. Stable mild intracranial mass effect. 2. No new intracranial abnormality. 3. Chronic left hemisphere ischemia.   Electronically Signed   By: Genevie Ann M.D.   On: 11/26/2014 10:30     Medications:     Scheduled Medications: . sodium chloride  10 mL/hr Intravenous Once  . amiodarone  200 mg Oral Daily  . atorvastatin  20 mg Oral QHS  . docusate sodium  100 mg Oral BID  . hydrALAZINE  50 mg Oral 3 times per day  . isosorbide mononitrate  30 mg Oral Daily  . pantoprazole  40 mg Oral Daily  . Warfarin - Pharmacist Dosing Inpatient   Does not apply q1800    Infusions:    PRN Medications: acetaminophen, albuterol, bisacodyl, hydrALAZINE, traMADol   Assessment:   1. Spontaneous intracerebral hemorrhage 2. Chronic systolic HF - Status post LVAD implantation for DT.  Volume status stable. No diuretics for now. Continue current dose of spironolactone, hydralazine, and imdur.  VAD parameters carefully reviewed. Increased PI events noted. 32 PI events 5/15; 50 PI events on 5/14; 25 PI events 5/13; 46 PI events 11/20/14.  - Check LDH, CMET, CBC,PT INR.  2. Anticoagulation management - Reversed with FFP. Started on  coumadin 5/19. INR up to 1.5 today. Goal for INR is 1.7-2.0 Holding all anti-coagulants now.  3. H/o Non Hodgkins Lymphoma s/p therapy 4. H/O CVA 2008: on left.  5. H/O VT/VF --St Jude ICD 2010  6.DMII 7. PSVT- Continue amiodarone 200 mg daily.    Plan/Discussion:    ICH .  Stable from NSU following.   Per neurosurgery able to restart coumadin today. INR 1.2>1.5     LVAD speed at 9000.  LDH 428>390 >346   Long-term management of anticoagulation will be a challenge in setting of VAD and spontaneous ICH. Stable to go home from dr Arnoldo Morale standpoint. Follow up in a week with Dr Arnoldo Morale for repeat INR.   I reviewed the LVAD parameters from today, and compared the results to the patient's prior recorded data.  No programming changes were made.  The LVAD is functioning within specified parameters.  The patient performs LVAD self-test daily.  LVAD interrogation was negative for any significant power  changes, alarms or PI events/speed drops.  LVAD equipment check completed and is in good working order.  Back-up equipment present.   LVAD education done on emergency procedures and precautions and reviewed exit site care.  Possible D/C today with INR in HF clinic Monday.   Length of Stay: 4 CLEGG,AMY NP-C  11/28/2014, 8:35 AM  VAD Team --- VAD ISSUES ONLY--- Pager [phone] (7am - 7am) Advanced Heart Failure Team  Pager [phone] (M-F; 7a - 4p)  Please contact [facility] for night-coverage after hours (4p -7a ) and weekends on [email or web address]   Patient seen and examined with Darrick Grinder, NP. We discussed all aspects of the encounter. I agree with the assessment and plan as stated above.   He is doing well. Wants to go home. ICH is stable. BP ok. Can go home today with close f/u. Stop ASA. Coumadin 2.5 daily with goal INR 1.7-2.2  Vad parameters stable.   Bensimhon, Daniel,MD 12:45 PM

## 2014-11-28 NOTE — Discharge Summary (Signed)
Advanced Heart Failure Team  Discharge Summary   Patient ID: Andre Tran MRN: [id number], DOB/AGE: April 10, 1961 54 y.o. Admit date: 11/24/2014 D/C date:     11/28/2014   Primary Discharge Diagnoses:  1. Spontaneous intracerebral hemorrhage 2. Chronic systolic HF - Status post LVAD implantation for DT.  3.  Anticoagulation management - Reversed with FFP. Started on coumadin 5/19. INR up to 1.5 today. Goal for INR is 1.7-2.2 . Off aspirin.   4.  H/o Non Hodgkins Lymphoma s/p therapy 5. H/O CVA 2008: on left.  6.  H/O VT/VF --St Jude ICD 2010  7. DMII 8. PSVT- Continue amiodarone 200 mg daily  Hospital Course:    Mr Schriver is a 54 y.o. 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 Underwent HMII LVAD placement 10/13/2014 .   Admitted May 16th with headache. CT  showed spontaneous intracerebral bleed. Neurosurgery consulted with recommendation to reverse anticoagulants. He received 2 units FFP and 5 mg vitamin K in the event he required surgical intervention. He had no neurologic changes on physical exam. Follow up CT of the head the next day showed slight enlargement of ICH.  The 3rd CT of the head did not show extension hematophage. At that point neurosurgery cleared him to resume coumadin. He was restarted coumadin without difficulty. On May 20th he was evaluated by Dr Jenel Lucks recommendations for discharge.   From HF/LVAD he remained stable. LDH was stable. Due to spontaneous ICH he will remain off aspirin. He will continue on current LVAD speed at 9000. On the day discharge he was deemed stable by Dr Haroldine Laws. He will follow up in the LVAD clinic May 23rd. Plan to  check INR, CBC, BMET, LDH at that time. He will follow up with Dr Arnoldo Morale next week with CT of the head.      LVAD INTERROGATION:  HeartMate II LVAD: Flow 4.8 liters/min, speed 9000, power 5, PI 5.7    Discharge Weight Range: 148 pounds  Discharge Vitals: Blood pressure 104/78, pulse 81, temperature 98.6 F (37 C), temperature source Oral, resp. rate 18, height 5\' 7"  (1.702 m), weight 148 lb 9.4 oz (67.4 kg), SpO2 100 %.  Labs: Lab Results  Component Value Date   WBC 7.7 11/28/2014   HGB 9.9* 11/28/2014   HCT 29.8* 11/28/2014   MCV 86.6 11/28/2014   PLT 242 11/28/2014     Recent Labs Lab 11/28/14 0231  NA 132*  K 3.5  CL 100*  CO2 25  BUN 13  CREATININE 0.97  CALCIUM 9.1  GLUCOSE 144*   Lab Results  Component Value Date   CHOL 106 06/21/2014   HDL 36* 06/21/2014   LDLCALC 58 06/21/2014   TRIG 61 06/21/2014   BNP (last 3 results)  Recent Labs  10/14/14 0245 10/20/14 0407 11/05/14 1030  BNP 540.0* 535.3* 118.8*    ProBNP (last 3 results)  Recent Labs  06/12/14 1415 06/18/14 1200  PROBNP 2079.0* 2144.0*     Diagnostic Studies/Procedures   No results found.  Discharge Medications     Medication List    STOP taking these medications        aspirin 325 MG EC tablet      TAKE  these medications        acetaminophen 325 MG tablet  Commonly known as:  TYLENOL  Take 325 mg by mouth every 6 (six) hours as needed for moderate pain.     albuterol 108 (90 BASE) MCG/ACT inhaler  Commonly known as:  PROVENTIL HFA;VENTOLIN HFA  Inhale 1-2 puffs into the lungs every 6 (six) hours as needed for wheezing or shortness of breath.     amiodarone 200 MG tablet  Commonly known as:  PACERONE  Take 1 tablet (200 mg total) by mouth daily.     amoxicillin 500 MG capsule  Commonly known as:  AMOXIL  Take 4 capsules (2,000 mg total) by mouth as needed (Take 2g (4 pills) 30 - 60 minutes before any dental procedure or cleaning).     atorvastatin 40 MG  tablet  Commonly known as:  LIPITOR  Take 0.5 tablets (20 mg total) by mouth at bedtime.     bisacodyl 5 MG EC tablet  Commonly known as:  DULCOLAX  Take 1 tablet (5 mg total) by mouth daily as needed for moderate constipation.     docusate sodium 100 MG capsule  Commonly known as:  COLACE  Take 1 capsule (100 mg total) by mouth 2 (two) times daily.     furosemide 40 MG tablet  Commonly known as:  LASIX  Take 0.5 tablets (20 mg total) by mouth as needed.     glipiZIDE 5 MG tablet  Commonly known as:  GLUCOTROL  Take 1 tablet (5 mg total) by mouth every evening.     hydrALAZINE 25 MG tablet  Commonly known as:  APRESOLINE  Take 2 tablets (50 mg total) by mouth every 8 (eight) hours.     insulin glargine 100 UNIT/ML injection  Commonly known as:  LANTUS  Inject 0.05 mLs (5 Units total) into the skin at bedtime.     isosorbide mononitrate 30 MG 24 hr tablet  Commonly known as:  IMDUR  Take 1 tablet (30 mg total) by mouth daily.     Magnesium 400 MG Tabs  Take 1 tablet by mouth daily with breakfast.     pantoprazole 40 MG tablet  Commonly known as:  PROTONIX  Take 1 tablet (40 mg total) by mouth daily.     traMADol 50 MG tablet  Commonly known as:  ULTRAM  Take 1-2 tablets (50-100 mg total) by mouth every 6 (six) hours as needed for moderate pain.     warfarin 5 MG tablet  Commonly known as:  COUMADIN  Take 0.5 tablets (2.5 mg total) by mouth one time only at 6 PM. Take  1/2 tablets daily.        Disposition   The patient will be discharged in stable condition to home. Discharge Instructions    Contraindication to ACEI at discharge    Complete by:  As directed      Diet - low sodium heart healthy    Complete by:  As directed      Heart Failure patients record your daily weight using the same scale at the same time of day    Complete by:  As directed      Increase activity slowly    Complete by:  As directed           Follow-up Information    Call  Ophelia Charter, MD.   Specialty:  Neurosurgery   Why:  Please call for an appointment.    Contact information:  [address] [place] [address] [place] [address] [phone]       Follow up with Glori Bickers, MD On 12/01/2014.   Specialty:  Cardiology   Why:  at 11:00 [place]    Contact information:   [place] [address] [phone]         Duration of Discharge Encounter: Greater than 35 minutes   Signed, CLEGG,AMY NP-C  11/28/2014, 12:50 PM

## 2014-11-28 NOTE — Care Management Note (Signed)
Case Management Note  Patient Details  Name: Andre Tran MRN: [id number] Date of Birth: 06-Nov-1960  Subjective/Objective:                  Patient lives at home with wife.  Independent - no deficits from [place].   Action/Plan: CM will continue to follow.   11-28-14 possible discharge home today.  No needs identified as this time.   Expected Discharge Date:                  Expected Discharge Plan:  Home/Self Care  In-House Referral:     Discharge planning Services  CM Consult  Post Acute Care Choice:    Choice offered to:     DME Arranged:    DME Agency:     HH Arranged:    HH Agency:     Status of Service:  In process, will continue to follow  Medicare Important Message Given:    Date Medicare IM Given:    Medicare IM give by:    Date Additional Medicare IM Given:    Additional Medicare Important Message give by:     If discussed at [place] of Stay Meetings, dates discussed:    Additional Comments:  Vergie Living, RN 11/28/2014, 10:36 AM

## 2014-11-28 NOTE — Care Management Note (Signed)
Case Management Note  Patient Details  Name: ANASTACIO BUA MRN: [id number] Date of Birth: [birth year]  Subjective/Objective:                  Patient ordered for resumption of [facility] RN with [facility].  [place] notified.   Action/Plan:   Expected Discharge Date:  11-28-14 Expected Discharge Plan:  Home/Self Care  In-House Referral:     Discharge planning Services  CM Consult  Post Acute Care Choice:  Home Health Choice offered to:  Patient  DME Arranged:    DME Agency:  [place] Arranged:  RN [facility] Agency:  [place], [place]  Status of Service:  Completed, signed off  Medicare Important Message Given:  Yes Date Medicare IM Given:  11/28/14 Medicare IM give by:  Luz Lex, RNBSN Date Additional Medicare IM Given:    Additional Medicare Important Message give by:     If discussed at [place] of Stay Meetings, dates discussed:    Additional Comments:  Vergie Living, RN 11/28/2014, 1:45 PM

## 2014-11-28 NOTE — Progress Notes (Signed)
Discharge instructions reviewed with pt and wife. Discharged to home.

## 2014-12-01 ENCOUNTER — Other Ambulatory Visit (HOSPITAL_COMMUNITY): Payer: Self-pay | Admitting: Infectious Diseases

## 2014-12-01 ENCOUNTER — Ambulatory Visit (HOSPITAL_COMMUNITY): Payer: Self-pay | Admitting: Infectious Diseases

## 2014-12-01 ENCOUNTER — Ambulatory Visit (HOSPITAL_COMMUNITY)
Admit: 2014-12-01 | Discharge: 2014-12-01 | Disposition: A | Discharge status: Home / Self Care | Payer: Medicare Other | Source: Ambulatory Visit | Attending: Internal Medicine | Admitting: Internal Medicine

## 2014-12-01 ENCOUNTER — Encounter (HOSPITAL_COMMUNITY): Payer: Self-pay

## 2014-12-01 VITALS — BP 90/0 | HR 90 | Ht 65.0 in | Wt 157.0 lb

## 2014-12-01 DIAGNOSIS — Z7901 Long term (current) use of anticoagulants: Secondary | ICD-10-CM | POA: Diagnosis not present

## 2014-12-01 DIAGNOSIS — Z95811 Presence of heart assist device: Secondary | ICD-10-CM

## 2014-12-01 DIAGNOSIS — I610 Nontraumatic intracerebral hemorrhage in hemisphere, subcortical: Secondary | ICD-10-CM | POA: Diagnosis not present

## 2014-12-01 DIAGNOSIS — I5022 Chronic systolic (congestive) heart failure: Secondary | ICD-10-CM | POA: Diagnosis not present

## 2014-12-01 DIAGNOSIS — Z48812 Encounter for surgical aftercare following surgery on the circulatory system: Secondary | ICD-10-CM | POA: Diagnosis not present

## 2014-12-01 LAB — CBC
HEMATOCRIT: 32.3 % — AB (ref 39.0–52.0)
Hemoglobin: 10.7 g/dL — ABNORMAL LOW (ref 13.0–17.0)
MCH: 29 pg (ref 26.0–34.0)
MCHC: 33.1 g/dL (ref 30.0–36.0)
MCV: 87.5 fL (ref 78.0–100.0)
Platelets: 269 10*3/uL (ref 150–400)
RBC: 3.69 MIL/uL — ABNORMAL LOW (ref 4.22–5.81)
RDW: 15.1 % (ref 11.5–15.5)
WBC: 8.9 10*3/uL (ref 4.0–10.5)

## 2014-12-01 LAB — BASIC METABOLIC PANEL
Anion gap: 9 (ref 5–15)
BUN: 14 mg/dL (ref 6–20)
CO2: 26 mmol/L (ref 22–32)
CREATININE: 1.07 mg/dL (ref 0.61–1.24)
Calcium: 9.6 mg/dL (ref 8.9–10.3)
Chloride: 100 mmol/L — ABNORMAL LOW (ref 101–111)
GFR calc non Af Amer: >60 mL/min (ref >60)
Glucose, Bld: 147 mg/dL — ABNORMAL HIGH (ref 65–99)
Potassium: 3.5 mmol/L (ref 3.5–5.1)
Sodium: 135 mmol/L (ref 135–145)

## 2014-12-01 LAB — PROTIME-INR
INR: 1.63 — AB (ref 0.00–1.49)
PROTHROMBIN TIME: 19.4 s — AB (ref 11.6–15.2)

## 2014-12-01 LAB — LACTATE DEHYDROGENASE: LDH: 349 U/L — ABNORMAL HIGH (ref 98–192)

## 2014-12-01 NOTE — Progress Notes (Signed)
Symptom  Yes  No  Details   Angina        x Activity:   Claudication        x       How far: walking one block  Syncope        x When:   Stroke        x  Right sided residuals and neuro residuals   Orthopnea        x How many pillows:  1-5 for comfort   PND        x How often:  CPAP     N/A How many hrs:   Pedal edema        x   Abd fullness        x   N&V        x  appetite fair; eating full meals at times  Diaphoresis        x When:  Bleeding       x   Urine color   Dark yellow - yellow   SOB        x Activity:   Palpitations        x When:  ICD shock        x   Hospitlizaitons        x When/where/why:  ED visit        x When/where/why:  Other MD        x When/who/why:  Activity         No limitations; home stairs, gardening, errands, walking 4.5 blocks 3 days a week.   Fluid    1 - 2 liters   Diet    Low sodium   Vital signs: HR: 90 MAP BP:  O2 Sat:  98% Wt:  157.2 (home weights 144 - 146, weight loss unintentionally) Last wt:  157.8 lbs Ht: 5'7"  LVAD interrogation & Equipment Check: Speed:  9000  Flow:  4.2 Power: 4.7 PI: 4.9 Alarms: none  Events: Has had 25 - 30 daily. 67 events 5/21 although PI's are in the 5's and Flows are all over 4.  Fixed speed:  9000 Low speed limit:  8400 Primary Controller:  Replace back up battery in  26 months. Back up controller:   Replace back up battery in 26 months.  I reviewed the LVAD parameters from today, and compared the results to the patient's prior recorded data. No programming changes were made. The LVAD is functioning within specified parameters. The patient performs LVAD self-test daily. LVAD interrogation was negative for any power changes or alarms.  He is having a high amount of PI events of which he is asymptomatic for. LV on last RAMP study was 4.2 cm on 9000. LVAD equipment check completed and is in good working order. Back-up equipment present. LVAD education done on emergency procedures and precautions and  reviewed exit site care. Pt is performing daily controller and system monitor self tests along with completing weekly and monthly maintenance for LVAD equipment. LVAD education done on emergency procedures and precautions and reviewed exit site care.   Encounter Details: Presents today with his wife for hospital D/C F/U after spontaneous ICH on coumadin therapy. (Current INR goal 1.7 - 2.2).   Complaining of some back pain today after having a busy outing yesterday and was doing a lot of walking. Positional and intermittent and relieved with rest. Not requiring Tramadol. Reports that he is sleeping comfortably and getting around well  with his equipment and with current wearables.   Both Regino Schultze and [name] are reporting that Jshaun cannot remember anything from May--he has thought it was April up until yesterday when he told her this in the car driving home. Follow up head CT scheduled Friday. Able to articulate that it is late May today. Gave them the number for Dr. Arnoldo Morale to schedule F/U appointment with him.   Reports that they will be going to [facility] June 24 - 26 and July 21 - 24. I provided them with contact information for the closest VAD center. Will obtain 24/7 hospital contact number to provide them next visit.   [facility]  [place], [place] [address]  [phone]   Still has not heard anything from Cardiac Rehab. Will reach out again today.     Driveline Site: VAD dressing removed and site care performed using sterile technique. Drive line exit site cleaned with Chlora prep applicators x 2, allowed to dry, and gauze dressing with aquacel strip re-applied. Exit site healing with good tissue ingrowth, the velour is fully implanted at exit site. No erythema, tenderness, or foul odor noted; small yellow/brown crusts at exit site. I was able to get one off but the other remains. Would like to advance him to weekly dressings next week--would  have done this week however the removal of the crusts left some fleshy/pink areas.  Drive line anchor in appropriate position and replaced.  Pt denies fever or chills. Driveline dressing is being changed Q3D per sterile technique. Instructed wife to call if any change in site; if she notices drainage, we will need to do daily dressing changes.      [name], RN VAD Coordinator  Office: [phone] 24/7 VAD Pager: [phone]

## 2014-12-01 NOTE — Patient Instructions (Signed)
1. Follow up with the clinic in 1 week  2. [facility] in [place] is the following:  [facility]  [place], [place] [address]  [phone]   We will contact the VAD team for you and get the 24h VAD contact number for you by your next visit.  3. INR is 1.63 today (Goal 1.7 - 2.2). Take your usual 1/2 a pill everyday EXCEPT 1 whole pill every Monday and Friday  4. We will contact Cardiac Rehab again to speed up the process.

## 2014-12-01 NOTE — Progress Notes (Signed)
Patient ID: TRASE BUNDA, male   DOB: 08-08-1960, 54 y.o.   MRN: [id number]   HPI: Mr Craver is a 54 y.o. 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 Underwent HMII LVAD placement 10/13/2014   Admitted April 1st 2016 for schedule HMII implant. Post implant he was taken back to the OR for bleeding otherwise his post operative course was uneventful. LVAD speed turned down to 9000 on the day of discharge. Discharge weight was 160 pounds.   Admitted 5/16 with headache and CT showed spontaneous intracerebral bleed with INR 3.1. Given vit k 5 and 2 u FFP. Repeat head CT the next am: showed Enlarging RIGHT posterior temporal lobe hematomas 3.2 cm, previously 2.6 cm with similar local mass-effect. Seen by Dr. Arnoldo Morale in [place] and treated conservatively. Remained stable from a neuro perspective. Repeat CT prior to d/c was stable. ASA stopped and Warfarin restarted.  Follow up for Heart Failure/LVAD: Continues to do very well s/p recent VAD placement. Weight at home trending down to 149 pounds. . Denies SOB/PND/Orthopnea. Denies dizziness. Not hungry. Wife changing dressing daily. Following low salt diet. Taking all medications.       Denies driveline trauma, erythema or drainage.  Denies ICD shocks.   Reports taking Coumadin as prescribed and adherence to anticoagulation based dietary restrictions.  Denies bright red blood per rectum or melena, no dark urine or hematuria.     Past Medical History  Diagnosis Date  . Paroxysmal ventricular tachycardia 2005, 2010    s/p AICD '05, replaced w/ St. Jude's in 2010 (PVT/V.Fib arrest requiring ICD implant in 2005)  . HYPERTENSION, UNSPECIFIED   .  HYPERLIPIDEMIA-MIXED   . 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 Nonischemic cardiomyopathy     2/2 Adriamycin administration for lymphoma  . CHF (congestive heart failure)     EF 15% by echo 2009; s/p St. Jude ICD  . Diabetes mellitus     Type 2  . Cancer 1992, 2007     non hodkins lymphoma 1992, Hodgkins 2007  . Depression   . LV (left ventricular) mural thrombus 2009    2009, on coumadin  . Small bowel obstruction 2001    s/p Small bowel resection 2001  . Jejunal intussusception 2008    2008  . 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 Splenic mass 2009    Noted on Abd [place] 2009 w/ recs for f/u CT - not done  . Hypotension   . Noncompliance with medication regimen   . AICD (automatic cardioverter/defibrillator) present 2005, 2010  . Presence of permanent cardiac pacemaker     Current Outpatient Prescriptions  Medication Sig Dispense Refill  . acetaminophen (TYLENOL) 325 MG tablet Take 325 mg by mouth every 6 (six) hours as needed for moderate pain.    Marland Kitchen albuterol (PROVENTIL HFA;VENTOLIN HFA) 108 (90 BASE) MCG/ACT inhaler Inhale 1-2 puffs into the lungs every 6 (six) hours as needed for wheezing or shortness of breath.    Marland Kitchen amiodarone (PACERONE) 200 MG tablet Take 1 tablet (200 mg total)  by mouth daily. 30 tablet 6  . amoxicillin (AMOXIL) 500 MG capsule Take 4 capsules (2,000 mg total) by mouth as needed (Take 2g (4 pills) 30 - 60 minutes before any dental procedure or cleaning). 12 capsule 3  . atorvastatin (LIPITOR) 40 MG tablet Take 0.5 tablets (20 mg total) by mouth at bedtime. 30 tablet 6  . bisacodyl (DULCOLAX) 5 MG EC tablet Take 1 tablet (5 mg total) by mouth daily as needed for moderate constipation. 30 tablet 0  . docusate sodium (COLACE) 100 MG capsule Take 1 capsule (100 mg total) by mouth 2 (two) times daily. 10 capsule 0  .  glipiZIDE (GLUCOTROL) 5 MG tablet Take 1 tablet (5 mg total) by mouth every evening. 30 tablet 6  . hydrALAZINE (APRESOLINE) 25 MG tablet Take 2 tablets (50 mg total) by mouth every 8 (eight) hours. 120 tablet 6  . insulin glargine (LANTUS) 100 UNIT/ML injection Inject 0.05 mLs (5 Units total) into the skin at bedtime. 10 mL 11  . isosorbide mononitrate (IMDUR) 30 MG 24 hr tablet Take 1 tablet (30 mg total) by mouth daily. 30 tablet 6  . Magnesium 400 MG TABS Take 1 tablet by mouth daily with breakfast. 90 tablet 3  . pantoprazole (PROTONIX) 40 MG tablet Take 1 tablet (40 mg total) by mouth daily. 30 tablet 6  . traMADol (ULTRAM) 50 MG tablet Take 1-2 tablets (50-100 mg total) by mouth every 6 (six) hours as needed for moderate pain. 30 tablet 0  . warfarin (COUMADIN) 5 MG tablet Take 0.5 tablets (2.5 mg total) by mouth one time only at 6 PM. Take  1/2 tablets daily. 45 tablet 3  . furosemide (LASIX) 40 MG tablet Take 0.5 tablets (20 mg total) by mouth as needed. (Patient not taking: Reported on 12/01/2014) 30 tablet    No current facility-administered medications for this encounter.    Review of patient's allergies indicates no known allergies.  REVIEW OF SYSTEMS: All systems negative except as listed in HPI, PMH and Problem list.   LVAD INTERROGATION:  HMII VAD Speed: 9000 Flow: 4.2 Power: 5.0 PI: 6.3 Alarms: Low Voltage/No External Power one time where he disconnected both lines  Events:Fixed speed: 9000 Low speed limit: 8400 Primary Controller: Replace back up battery in 26 months.  Back up controller: Replace back up battery in 26 months.    I reviewed the LVAD parameters from today, and compared the results to the patient's prior recorded data.  No programming changes were made.  The LVAD is functioning within specified parameters.  The patient performs LVAD self-test daily.  LVAD interrogation was negative for any significant power changes, alarms or PI events/speed  drops.  LVAD equipment check completed and is in good working order.  Back-up equipment present.   LVAD education done on emergency procedures and precautions and reviewed exit site care.    Filed Vitals:   12/01/14 1150  BP: 90/0  Pulse: 90  Height: 5\' 5"  (1.651 m)  Weight: 157 lb (71.215 kg)  SpO2: 98%   MAP 90 Physical Exam: GENERAL: Well appearing, male who presents to clinic today in no acute distress with his wife.  HEENT: normal  NECK: Supple, JVP flat  .  2+ bilaterally, no bruits.  No lymphadenopathy or thyromegaly appreciated.   CARDIAC:  Mechanical heart sounds with LVAD hum present.  LUNGS:  Clear to auscultation bilaterally.  ABDOMEN:  Soft, round, nontender, positive bowel sounds x4.     LVAD exit site: well-healed  and incorporated.  Dressing dry and intact.  No erythema or drainage.  Stabilization device present and accurately applied.  Driveline dressing is being changed daily per sterile technique. EXTREMITIES:  Warm and dry, no cyanosis, clubbing, rash or edema  NEUROLOGIC:  Alert and oriented x 4.  Gait steady.  No aphasia.  No dysarthria.  Affect pleasant.       ASSESSMENT AND PLAN:  1. Chronic systolic HF - Status post LVAD implantation for DT.  NYHA I-II Volume status looks low. Stop spiro   Continue current dose of hydralazine and Imdur VAD parameters carefully reviewed. Increased PI events noted. Likely because he is dry.  Check LDH, CMET, CBC,PT INR 2.  Anticoagulation management - INR goal 2.0-3.0.  Will evaluate INR value today and follow up with patient for necessary changes.  See anticoagulation flow sheet  3. H/o Non Hodgkins Lymphoma s/p therapy 4.  H/O CVA 2008  5. H/O VT/VF --St Jude ICD 2010  6. DMII 7. 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   Patient seen and examined with Darrick Grinder, NP. We discussed all aspects of the encounter. I agree with the assessment and plan as stated above.   Overall doing well with VAD support. Looks  dry. Will stop spiro. Otherwise continue current therapy. Reviewed VAD restrictions with him. VAD parameters reveal multiple PI events. Likely due to volume depletion. Arlyce Harman stopped.   Khloe Hunkele,MD 4:00 PM

## 2014-12-02 ENCOUNTER — Telehealth (HOSPITAL_COMMUNITY): Payer: Self-pay | Admitting: Infectious Diseases

## 2014-12-02 ENCOUNTER — Encounter (HOSPITAL_COMMUNITY): Payer: Self-pay | Admitting: Infectious Diseases

## 2014-12-02 DIAGNOSIS — I10 Essential (primary) hypertension: Secondary | ICD-10-CM

## 2014-12-02 MED ORDER — HYDRALAZINE HCL 25 MG PO TABS: 75.0000 mg | ORAL_TABLET | Freq: Three times a day (TID) | ORAL | Status: DC

## 2014-12-02 NOTE — Telephone Encounter (Signed)
Left message with Harrold Donath regarding medication changes that were discussed with Dr. Haroldine Laws. Instructed her that we would like to increase his Hydralazine to 75mg  TID for a target MAP of 70. Instructions left to call back to confirm. Will attempt a phone call tomorrow as well.

## 2014-12-02 NOTE — Telephone Encounter (Signed)
Called again re: medication changes to Hydralazine. Instructed to call back to confirm once message received.

## 2014-12-02 NOTE — Telephone Encounter (Signed)
Got Andre Tran on the phone and went over medication change and why the team requested change. Discussed that with his recent history of his spontaneous head bleed the medical team would like to keep his MAP around 70 to lower the pressure exerted on the vessel walls. She verbalized understanding and all questions answered. She advised me that she will begin the increased Hydralazine dose this PM and continue until we re-evaluate in clinic.

## 2014-12-03 ENCOUNTER — Encounter (HOSPITAL_COMMUNITY): Payer: Medicare Other

## 2014-12-03 ENCOUNTER — Telehealth (HOSPITAL_COMMUNITY): Payer: Self-pay | Admitting: *Deleted

## 2014-12-03 NOTE — Telephone Encounter (Signed)
Wife called and left message stating pt hasn't had bowel movement in 2 weeks. Called wife per Dr. Haroldine Laws and instructed her to give pt one bottle of sorbitol for constipation. Asked her to call if symptoms not relieved or worsen, wife verbalized understanding of same.

## 2014-12-03 NOTE — Telephone Encounter (Signed)
[facility] RN stated pt has not had a bowel movement in 2 weeks taking 5mg  dulcolax and 100 mg colace.  I spoke with Cloyde Reams and she will call the RN to advise.  callbck number [phone]

## 2014-12-04 ENCOUNTER — Other Ambulatory Visit: Payer: Self-pay | Admitting: Neurosurgery

## 2014-12-04 DIAGNOSIS — I609 Nontraumatic subarachnoid hemorrhage, unspecified: Secondary | ICD-10-CM

## 2014-12-05 ENCOUNTER — Ambulatory Visit
Admission: RE | Admit: 2014-12-05 | Discharge: 2014-12-05 | Disposition: A | Discharge status: Home / Self Care | Payer: Medicare Other | Source: Ambulatory Visit | Attending: Neurosurgery | Admitting: Neurosurgery

## 2014-12-05 DIAGNOSIS — I609 Nontraumatic subarachnoid hemorrhage, unspecified: Secondary | ICD-10-CM

## 2014-12-09 ENCOUNTER — Other Ambulatory Visit (HOSPITAL_COMMUNITY): Payer: Self-pay | Admitting: *Deleted

## 2014-12-09 DIAGNOSIS — Z7901 Long term (current) use of anticoagulants: Secondary | ICD-10-CM

## 2014-12-09 DIAGNOSIS — Z95811 Presence of heart assist device: Secondary | ICD-10-CM

## 2014-12-10 ENCOUNTER — Ambulatory Visit (HOSPITAL_COMMUNITY)
Admission: RE | Admit: 2014-12-10 | Discharge: 2014-12-10 | Disposition: A | Discharge status: Home / Self Care | Payer: Medicare Other | Source: Ambulatory Visit | Attending: Cardiology | Admitting: Cardiology

## 2014-12-10 ENCOUNTER — Ambulatory Visit (HOSPITAL_COMMUNITY): Payer: Self-pay | Admitting: *Deleted

## 2014-12-10 ENCOUNTER — Other Ambulatory Visit (HOSPITAL_COMMUNITY): Payer: Self-pay | Admitting: *Deleted

## 2014-12-10 VITALS — BP 88/0 | HR 88 | Ht 67.0 in | Wt 156.2 lb

## 2014-12-10 DIAGNOSIS — I472 Ventricular tachycardia: Secondary | ICD-10-CM

## 2014-12-10 DIAGNOSIS — Z95811 Presence of heart assist device: Secondary | ICD-10-CM

## 2014-12-10 DIAGNOSIS — I10 Essential (primary) hypertension: Secondary | ICD-10-CM | POA: Diagnosis not present

## 2014-12-10 DIAGNOSIS — Z8673 Personal history of transient ischemic attack (TIA), and cerebral infarction without residual deficits: Secondary | ICD-10-CM | POA: Diagnosis not present

## 2014-12-10 DIAGNOSIS — Z7901 Long term (current) use of anticoagulants: Secondary | ICD-10-CM

## 2014-12-10 DIAGNOSIS — E11649 Type 2 diabetes mellitus with hypoglycemia without coma: Secondary | ICD-10-CM | POA: Insufficient documentation

## 2014-12-10 DIAGNOSIS — Z8572 Personal history of non-Hodgkin lymphomas: Secondary | ICD-10-CM | POA: Insufficient documentation

## 2014-12-10 DIAGNOSIS — I5022 Chronic systolic (congestive) heart failure: Secondary | ICD-10-CM | POA: Diagnosis present

## 2014-12-10 DIAGNOSIS — I4729 Other ventricular tachycardia: Secondary | ICD-10-CM

## 2014-12-10 DIAGNOSIS — I471 Supraventricular tachycardia: Secondary | ICD-10-CM | POA: Diagnosis not present

## 2014-12-10 DIAGNOSIS — I619 Nontraumatic intracerebral hemorrhage, unspecified: Secondary | ICD-10-CM

## 2014-12-10 DIAGNOSIS — E119 Type 2 diabetes mellitus without complications: Secondary | ICD-10-CM

## 2014-12-10 LAB — CBC
HEMATOCRIT: 31.6 % — AB (ref 39.0–52.0)
HEMOGLOBIN: 10.4 g/dL — AB (ref 13.0–17.0)
MCH: 28.6 pg (ref 26.0–34.0)
MCHC: 32.9 g/dL (ref 30.0–36.0)
MCV: 86.8 fL (ref 78.0–100.0)
Platelets: 301 10*3/uL (ref 150–400)
RBC: 3.64 MIL/uL — ABNORMAL LOW (ref 4.22–5.81)
RDW: 14.9 % (ref 11.5–15.5)
WBC: 9.6 10*3/uL (ref 4.0–10.5)

## 2014-12-10 LAB — BASIC METABOLIC PANEL
Anion gap: 10 (ref 5–15)
BUN: 11 mg/dL (ref 6–20)
CO2: 24 mmol/L (ref 22–32)
Calcium: 9.5 mg/dL (ref 8.9–10.3)
Chloride: 100 mmol/L — ABNORMAL LOW (ref 101–111)
Creatinine, Ser: 1.09 mg/dL (ref 0.61–1.24)
GFR calc Af Amer: >60 mL/min (ref >60)
GLUCOSE: 175 mg/dL — AB (ref 65–99)
POTASSIUM: 3.5 mmol/L (ref 3.5–5.1)
SODIUM: 134 mmol/L — AB (ref 135–145)

## 2014-12-10 LAB — PROTIME-INR
INR: 1.76 — AB (ref 0.00–1.49)
Prothrombin Time: 20.5 seconds — ABNORMAL HIGH (ref 11.6–15.2)

## 2014-12-10 LAB — LACTATE DEHYDROGENASE: LDH: 327 U/L — ABNORMAL HIGH (ref 98–192)

## 2014-12-10 NOTE — Progress Notes (Signed)
Patient ID: Andre Tran, male   DOB: Feb 28, 1961, 54 y.o.   MRN: [id number]  PCP: Followed [place]. Dr Ermelinda Das  HPI: Mr Cullars is a 55 y.o. 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 Underwent HMII LVAD placement 10/13/2014   Admitted April 1st 2016 for schedule HMII implant. Post implant he was taken back to the OR for bleeding otherwise his post operative course was uneventful. LVAD speed turned down to 9000 on the day of discharge. Discharge weight was 160 pounds.   Admitted 5/16 with headache and CT showed spontaneous intracerebral bleed with INR 3.1. Given vit k 5 and 2 u FFP. Repeat head CT the next am: showed Enlarging RIGHT posterior temporal lobe hematomas 3.2 cm, previously 2.6 cm with similar local mass-effect. Seen by Dr. Arnoldo Morale in [place] and treated conservatively. Remained stable from a neuro perspective. Repeat CT prior to d/c was stable. ASA stopped and Warfarin restarted.  Follow up for Heart Failure/LVAD: Last visit spiro was stopped due to increased PI events. Overall feeling good. Weight  147-150 pounds. Denies SOB/PND/Orthopnea. No headaches. No BRBPR. Improved appetite. Having episodes of hypoglycemia. Wife changing dressing every 3 days.  Following low salt diet. Taking all medications.  Able to walk 1/2 mile.   Denies driveline trauma, erythema or drainage.  Denies ICD shocks.   Reports taking Coumadin as prescribed and adherence to anticoagulation based dietary restrictions.  Denies bright red blood per rectum or melena, no dark urine or hematuria.     Past Medical History  Diagnosis Date  . Paroxysmal ventricular tachycardia 2005, 2010    s/p AICD  '05, replaced w/ St. Jude's in 2010 (PVT/V.Fib arrest requiring ICD implant in 2005)  . HYPERTENSION, UNSPECIFIED   . HYPERLIPIDEMIA-MIXED   . 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 Nonischemic cardiomyopathy     2/2 Adriamycin administration for lymphoma  . CHF (congestive heart failure)     EF 15% by echo 2009; s/p St. Jude ICD  . Diabetes mellitus     Type 2  . Cancer 1992, 2007     non hodkins lymphoma 1992, Hodgkins 2007  . Depression   . LV (left ventricular) mural thrombus 2009    2009, on coumadin  . Small bowel obstruction 2001    s/p Small bowel resection 2001  . Jejunal intussusception 2008    2008  . 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 Splenic mass 2009    Noted on Abd [place] 2009 w/ recs for f/u CT - not done  . Hypotension   . Noncompliance with medication regimen   . AICD (automatic cardioverter/defibrillator) present 2005, 2010  . Presence of permanent cardiac pacemaker     Current Outpatient Prescriptions  Medication Sig Dispense Refill  . amiodarone (PACERONE) 200 MG tablet Take 1 tablet (200 mg total) by mouth daily. 30 tablet 6  . atorvastatin (LIPITOR) 40 MG tablet Take 0.5 tablets (20 mg total) by mouth at bedtime. 30 tablet 6  . bisacodyl (DULCOLAX) 5 MG EC tablet Take 1 tablet (5  mg total) by mouth daily as needed for moderate constipation. 30 tablet 0  . glipiZIDE (GLUCOTROL) 5 MG tablet Take 1 tablet (5 mg total) by mouth every evening. 30 tablet 6  . hydrALAZINE (APRESOLINE) 25 MG tablet Take 3 tablets (75 mg total) by mouth every 8 (eight) hours. 120 tablet 6  . insulin glargine (LANTUS) 100 UNIT/ML injection Inject 0.05 mLs (5 Units total) into the skin at bedtime. 10 mL 11  . isosorbide mononitrate (IMDUR) 30 MG 24 hr tablet Take 1 tablet (30 mg total) by mouth daily. 30 tablet 6  . Magnesium 400 MG TABS  Take 1 tablet by mouth daily with breakfast. 90 tablet 3  . pantoprazole (PROTONIX) 40 MG tablet Take 1 tablet (40 mg total) by mouth daily. 30 tablet 6  . traMADol (ULTRAM) 50 MG tablet Take 1-2 tablets (50-100 mg total) by mouth every 6 (six) hours as needed for moderate pain. 30 tablet 0  . warfarin (COUMADIN) 5 MG tablet Take 0.5 tablets (2.5 mg total) by mouth one time only at 6 PM. Take  1/2 tablets daily. 45 tablet 3  . acetaminophen (TYLENOL) 325 MG tablet Take 325 mg by mouth every 6 (six) hours as needed for moderate pain.    Marland Kitchen albuterol (PROVENTIL HFA;VENTOLIN HFA) 108 (90 BASE) MCG/ACT inhaler Inhale 1-2 puffs into the lungs every 6 (six) hours as needed for wheezing or shortness of breath.    Marland Kitchen amoxicillin (AMOXIL) 500 MG capsule Take 4 capsules (2,000 mg total) by mouth as needed (Take 2g (4 pills) 30 - 60 minutes before any dental procedure or cleaning). (Patient not taking: Reported on 12/10/2014) 12 capsule 3  . docusate sodium (COLACE) 100 MG capsule Take 1 capsule (100 mg total) by mouth 2 (two) times daily. (Patient not taking: Reported on 12/10/2014) 10 capsule 0  . furosemide (LASIX) 40 MG tablet Take 0.5 tablets (20 mg total) by mouth as needed. (Patient not taking: Reported on 12/01/2014) 30 tablet    No current facility-administered medications for this encounter.    Review of patient's allergies indicates no known allergies.  REVIEW OF SYSTEMS: All systems negative except as listed in HPI, PMH and Problem list.   LVAD INTERROGATION:  HMII VAD Speed: 9000 Flow: 4.7 Power: 5.0 PI: 6.2 Now having 18-27 PI events daily Alarms: Low Voltage/No External Power one time where he disconnected both lines  Events:Fixed speed: 9000 Low speed limit: 8400 Primary Controller: Replace back up battery in 24 months.  Back up controller: Replace back up battery in 24 months.   I reviewed the LVAD parameters from today, and compared the results to the patient's prior recorded  data.  No programming changes were made.  The LVAD is functioning within specified parameters.  The patient performs LVAD self-test daily.  LVAD interrogation was negative for any significant power changes, alarms or PI events/speed drops.  LVAD equipment check completed and is in good working order.  Back-up equipment present.   LVAD education done on emergency procedures and precautions and reviewed exit site care.    Filed Vitals:   12/10/14 1029  BP: 88/0  Pulse: 88  Height: 5\' 7"  (1.702 m)  Weight: 156 lb 3.2 oz (70.852 kg)  SpO2: 97%   MAP 88 Physical Exam: GENERAL: Well appearing, male who presents to clinic today in no acute distress with his wife.  HEENT: normal  NECK: Supple, JVP flat  .  2+ bilaterally, no bruits.  No lymphadenopathy or thyromegaly appreciated.  CARDIAC:  Mechanical heart sounds with LVAD hum present.  LUNGS:  Clear to auscultation bilaterally.  ABDOMEN:  Soft, round, nontender, positive bowel sounds x4.     LVAD exit site: well-healed and incorporated.  Dressing dry and intact.  No erythema or drainage.  Stabilization device present and accurately applied.  Driveline dressing is being changed daily per sterile technique. EXTREMITIES:  Warm and dry, no cyanosis, clubbing, rash or edema  NEUROLOGIC:  Alert and oriented x 4.  Gait steady.  No aphasia.  No dysarthria.  Affect pleasant.       ASSESSMENT AND PLAN:  1. Chronic systolic HF - Status post LVAD implantation for DT.  NYHA I-II Volume status stable. Keep off spiro as he had multiple PI events when he was on it.  MAP 88. Increase hydralazine 100 mg tid. Continue current Imdur VAD parameters carefully reviewed. Fewer PI events off spiro.   Change LVAD dressings weekly by his wife.  Check LDH, CMET, CBC,PT INR 2.  Anticoagulation management - INR goal 1.7-2.2 Off aspirin due to spontaneous bleed. Continue coumadin. See anticoagulation flow sheet  3. H/o Non Hodgkins Lymphoma s/p therapy 4.  H/O CVA  2008  5. H/O VT/VF --St Jude ICD 2010  6. DMII- having episodes of hypoglycemia- Stop glipizide. Continue lantus 5 units at night. Suggested glipizide but he wants to continue lantus. Follow up with  PCP 7. 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 Need to check TSH next visit. Needs yearly eye exam.  8. 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  Neurosurgery evaluated. Aspirin stopped and remains discontinued. Restarted back on coumadin-->11/27/2014  Smaller hemorrhage noted on follow up CT.  11/27/2014. Had follow up with Dr Arnoldo Morale and has been released.   Follow up in 2 weeks. Todays labs ok. LDH coming down. INR therapeutic.   CLEGG,AMY,NP-C  10:46 AM

## 2014-12-10 NOTE — Patient Instructions (Addendum)
1.  Increase Hydralazine to 100 mg three times daily (Rx sent). 2.  Return to [facility] in two weeks.  3.  Stop Glipizide.

## 2014-12-10 NOTE — Progress Notes (Signed)
Symptom  Yes  No  Details   Angina        x Activity:   Claudication        x       How far:  1/2 mile  Syncope        x When:   Stroke        x    Orthopnea        x How many pillows:  4 pillows for comfort   PND        x How often:  CPAP       N/A How many hrs:   Pedal edema        x   Abd fullness        x   N&V        x  fair appetite; eating full meal  Diaphoresis        x When:  Bleeding       x   Urine color   Light yellow  SOB        x Activity:    Palpitations    When:  ICD shock      Hospitlizaitons    When/where/why:  ED visit    When/where/why:  Other MD    When/who/why: walking 1/2 mile daily  Activity    Walking 0.5 miles 3 x week  Fluid    < 2 liters  Diet    No added salt   Vital signs: HR: 95 MAP BP: 88 Auto cuff:  116/74 (74) O2 Sat:  97 Wt:   156.2 lbs Last wt:  157.2 lbs Ht: 5'7"  Home weights:  147.5 - 150 lbs  Home BS's:  59 - 120   LVAD interrogation reveals:  Speed:  9000 Flow:  5.3 Power:   5.3 PI:  5.0 Alarms:  none Events:  15 - 25 PI events Fixed speed:  9000 Low speed limit:  8400 Primary Controller:  Replace back up battery in 24 months. Back up controller:   Replace back up battery in 24 months.   LVAD exit site:  Well healed and incorporated. The velour is fully implanted at exit site. Gauze dressing with aquacel strip dry and intact. Dressing change using sterile technique, no erythema, tenderness, drainage, or foul odor; some dry eschar noted around site. Stabilization device present and accurately applied. Driveline dressing is being changed every three days using guaze dressing with aquacel strip.  Will advance to weekly dressings using Sorbaview dressing with biopatch on exit site. Demonstrated new dressing to wife; Instructed her to call if any redness or drainage occurs with new dressing.  Wife and patient verbalized understanding of same. Pt denies fever or chills. Pt/caregiver provided with new weekly dressing kits for home  use.  I reviewed the LVAD parameters from today, and compared the results to the patient's prior recorded data. No programming changes were made. The LVAD is functioning within specified parameters.  LVAD interrogation was negative for any significant power changes or alarms; fewer PI events/speed drops over last week.    Pt/caregiver deny any alarms or VAD equipment issues. VAD coordinator reviewed daily log from home for daily temperature, weight, and VAD parameters. Pt is performing daily controller and system monitor self tests along with completing weekly and monthly maintenance for LVAD equipment.   LVAD equipment check completed and is in good working order. Back-up equipment present. LVAD education done on emergency procedures and precautions and reviewed exit site  care.     Zada Girt, RN

## 2014-12-12 ENCOUNTER — Telehealth (HOSPITAL_COMMUNITY): Payer: Self-pay | Admitting: *Deleted

## 2014-12-12 ENCOUNTER — Encounter (HOSPITAL_COMMUNITY): Payer: Self-pay | Admitting: *Deleted

## 2014-12-12 ENCOUNTER — Encounter: Payer: Self-pay | Admitting: *Deleted

## 2014-12-12 ENCOUNTER — Other Ambulatory Visit (HOSPITAL_COMMUNITY): Payer: Self-pay | Admitting: *Deleted

## 2014-12-12 DIAGNOSIS — Z7901 Long term (current) use of anticoagulants: Secondary | ICD-10-CM

## 2014-12-12 DIAGNOSIS — Z95811 Presence of heart assist device: Secondary | ICD-10-CM

## 2014-12-12 NOTE — Telephone Encounter (Signed)
Pt's wife called to report she has been in contact with [facility] and spoke with Serita Grammes, [place] coordinator in preparation for upcoming trip.  She asked that we fax patient info to [phone], Attn: Serita Grammes prior to trip.  She obtained 24/7 emergency contact # for VAD patients.

## 2014-12-16 ENCOUNTER — Telehealth (HOSPITAL_COMMUNITY): Payer: Self-pay | Admitting: *Deleted

## 2014-12-17 ENCOUNTER — Other Ambulatory Visit (INDEPENDENT_AMBULATORY_CARE_PROVIDER_SITE_OTHER): Payer: Medicare Other | Admitting: *Deleted

## 2014-12-17 DIAGNOSIS — Z95811 Presence of heart assist device: Secondary | ICD-10-CM | POA: Diagnosis not present

## 2014-12-17 DIAGNOSIS — Z7901 Long term (current) use of anticoagulants: Secondary | ICD-10-CM

## 2014-12-17 LAB — PROTIME-INR
INR: 1.6 ratio — AB (ref 0.8–1.0)
Prothrombin Time: 17.8 s — ABNORMAL HIGH (ref 9.6–13.1)

## 2014-12-18 ENCOUNTER — Ambulatory Visit (HOSPITAL_COMMUNITY): Payer: Self-pay | Admitting: *Deleted

## 2014-12-19 ENCOUNTER — Emergency Department (HOSPITAL_COMMUNITY)
Admission: EM | Admit: 2014-12-19 | Discharge: 2014-12-19 | Disposition: A | Discharge status: Home / Self Care | Payer: Medicare Other | Attending: Emergency Medicine | Admitting: Emergency Medicine

## 2014-12-19 ENCOUNTER — Telehealth: Payer: Self-pay | Admitting: Infectious Diseases

## 2014-12-19 ENCOUNTER — Emergency Department (HOSPITAL_COMMUNITY): Payer: Medicare Other

## 2014-12-19 DIAGNOSIS — I10 Essential (primary) hypertension: Secondary | ICD-10-CM | POA: Diagnosis not present

## 2014-12-19 DIAGNOSIS — M79602 Pain in left arm: Secondary | ICD-10-CM

## 2014-12-19 DIAGNOSIS — I509 Heart failure, unspecified: Secondary | ICD-10-CM | POA: Diagnosis not present

## 2014-12-19 DIAGNOSIS — Z79899 Other long term (current) drug therapy: Secondary | ICD-10-CM | POA: Diagnosis not present

## 2014-12-19 DIAGNOSIS — M65829 Other synovitis and tenosynovitis, unspecified upper arm: Secondary | ICD-10-CM | POA: Diagnosis not present

## 2014-12-19 DIAGNOSIS — Z859 Personal history of malignant neoplasm, unspecified: Secondary | ICD-10-CM | POA: Insufficient documentation

## 2014-12-19 DIAGNOSIS — E119 Type 2 diabetes mellitus without complications: Secondary | ICD-10-CM | POA: Diagnosis not present

## 2014-12-19 DIAGNOSIS — Z950 Presence of cardiac pacemaker: Secondary | ICD-10-CM | POA: Diagnosis not present

## 2014-12-19 DIAGNOSIS — I5022 Chronic systolic (congestive) heart failure: Secondary | ICD-10-CM | POA: Diagnosis not present

## 2014-12-19 DIAGNOSIS — Z95811 Presence of heart assist device: Secondary | ICD-10-CM

## 2014-12-19 DIAGNOSIS — M779 Enthesopathy, unspecified: Secondary | ICD-10-CM

## 2014-12-19 DIAGNOSIS — Z7901 Long term (current) use of anticoagulants: Secondary | ICD-10-CM | POA: Diagnosis not present

## 2014-12-19 DIAGNOSIS — F329 Major depressive disorder, single episode, unspecified: Secondary | ICD-10-CM | POA: Insufficient documentation

## 2014-12-19 DIAGNOSIS — Z8673 Personal history of transient ischemic attack (TIA), and cerebral infarction without residual deficits: Secondary | ICD-10-CM | POA: Insufficient documentation

## 2014-12-19 DIAGNOSIS — Z9119 Patient's noncompliance with other medical treatment and regimen: Secondary | ICD-10-CM | POA: Insufficient documentation

## 2014-12-19 DIAGNOSIS — Z794 Long term (current) use of insulin: Secondary | ICD-10-CM | POA: Insufficient documentation

## 2014-12-19 DIAGNOSIS — E782 Mixed hyperlipidemia: Secondary | ICD-10-CM | POA: Insufficient documentation

## 2014-12-19 DIAGNOSIS — M778 Other enthesopathies, not elsewhere classified: Secondary | ICD-10-CM

## 2014-12-19 LAB — CBC
HCT: 30.3 % — ABNORMAL LOW (ref 39.0–52.0)
Hemoglobin: 9.9 g/dL — ABNORMAL LOW (ref 13.0–17.0)
MCH: 28.7 pg (ref 26.0–34.0)
MCHC: 32.7 g/dL (ref 30.0–36.0)
MCV: 87.8 fL (ref 78.0–100.0)
Platelets: 265 10*3/uL (ref 150–400)
RBC: 3.45 MIL/uL — ABNORMAL LOW (ref 4.22–5.81)
RDW: 15.3 % (ref 11.5–15.5)
WBC: 8.6 10*3/uL (ref 4.0–10.5)

## 2014-12-19 LAB — BASIC METABOLIC PANEL
ANION GAP: 9 (ref 5–15)
BUN: 13 mg/dL (ref 6–20)
CHLORIDE: 102 mmol/L (ref 101–111)
CO2: 24 mmol/L (ref 22–32)
Calcium: 9.1 mg/dL (ref 8.9–10.3)
Creatinine, Ser: 0.96 mg/dL (ref 0.61–1.24)
GFR calc Af Amer: >60 mL/min (ref >60)
GFR calc non Af Amer: >60 mL/min (ref >60)
GLUCOSE: 136 mg/dL — AB (ref 65–99)
POTASSIUM: 3.7 mmol/L (ref 3.5–5.1)
Sodium: 135 mmol/L (ref 135–145)

## 2014-12-19 LAB — LACTATE DEHYDROGENASE: LDH: 292 U/L — AB (ref 98–192)

## 2014-12-19 LAB — PROTIME-INR
INR: 1.57 — AB (ref 0.00–1.49)
Prothrombin Time: 18.8 seconds — ABNORMAL HIGH (ref 11.6–15.2)

## 2014-12-19 LAB — BRAIN NATRIURETIC PEPTIDE: B Natriuretic Peptide: 82.3 pg/mL (ref 0.0–100.0)

## 2014-12-19 LAB — D-DIMER, QUANTITATIVE: D-Dimer, Quant: 2.5 ug/mL-FEU — ABNORMAL HIGH (ref 0.00–0.48)

## 2014-12-19 LAB — I-STAT TROPONIN, ED: Troponin i, poc: 0.01 ng/mL (ref 0.00–0.08)

## 2014-12-19 MED ORDER — ACETAMINOPHEN 325 MG PO TABS
650.0000 mg | ORAL_TABLET | Freq: Once | ORAL | Status: AC
  Administered 2014-12-19: 650 mg via ORAL
  Filled 2014-12-19: qty 2

## 2014-12-19 MED ORDER — ACETAMINOPHEN ER 650 MG PO TBCR: 650.0000 mg | EXTENDED_RELEASE_TABLET | Freq: Three times a day (TID) | ORAL | Status: AC | PRN

## 2014-12-19 NOTE — ED Notes (Signed)
Weighed Patient, notified RN

## 2014-12-19 NOTE — Telephone Encounter (Signed)
Andre Tran's wife Andre Tran called VAD pager to report [name] has been experiencing sudden, unrelenting, severe left arm pain, mild SOB and CP; this started this morning, no injury to explain it and he requested her to bring him to ED. She is en route to the ED for evaluation now. ED Charge RN made aware and requested to page me when he arrives to assist with evaluation and LVAD interrogation. VAD medical team made aware of impending arrival.

## 2014-12-19 NOTE — Discharge Instructions (Signed)
Thank you for allowing [place] to be involved in your healthcare while you were hospitalized at [facility].   Please note that there have not been changes to your home medications.  --> PLEASE LOOK AT YOUR DISCHARGE MEDICATION LIST FOR DETAILS.   Please call your PCP if you have any questions or concerns, or any difficulty getting any of your medications.  Please return to the ER if you have worsening of your symptoms or new severe symptoms arise.  Your symptoms are due to muscle pain. You can take Tylenol every 8 hours as needed for pain. Rest and ice the area. If you develop swelling or increased pain, return to the ED. If you develop symptoms of chest pain or shortness of breath please return to the ED.   Triceps Tendinitis with Rehab Triceps tendinitis usually results in a ligament sprain (tear). The triceps tendon attaches the elbow to the triceps muscle on the back of the arm. It prevents the elbow from bending too far outward. Sprains are classified into three categories. Grade 1 sprains cause pain, but the tendon is not lengthened. Grade 2 sprains include a lengthened ligament due to the ligament being stretched or partially ruptured. With grade 2 sprains there is still function, although the function may be diminished. Grade 3 sprains are characterized by a complete tear of the tendon or muscle and function is usually impaired. SYMPTOMS   Pain, tenderness, swelling, and/or bruising over the site of injury (contusion).  Pain that worsens with elbow movement, such as push-ups.  A "pop" or tear felt or heard at the time of injury.  Decreased elbow function and/or grip strength. CAUSES   Overuse of triceps muscles and tendons.  Injury, laceration or direct blow to the triceps tendon. RISK INCREASES WITH:  Activities in which falling is likely.  Weightlifting and push-ups.  Poor strength and flexibility.  Steroid use. PREVENTION  Warm up and stretch properly  before activity.  Maintain physical fitness:  Strength, flexibility, and endurance.  Cardiovascular fitness.  Learn and use proper technique. When possible, have coach correct improper technique.  Functional braces may be effective in preventing injury, especially re-injury, in contact sports. PROGNOSIS  Triceps sprains usually heal in 6 weeks with proper treatment and rest. RELATED COMPLICATIONS  Tendon rupture requiring surgery.  Loss of motion in the elbow  Prolonged healing time, if improperly treated or re-injured. TREATMENT  Treatment initially involves resting from any activities that aggravate the symptoms, and the use of ice and medications to help reduce pain and inflammation. Referral to a therapist for further evaluation and treatment may be enough for a full recovery. If rehabilitation alone is insufficient for resolving the injury, then surgery may be necessary to use other tissue to recreate (reconstruct) the torn tendon. After surgery, immobilization of the elbow is necessary to allow for healing. After immobilization it is important to perform strengthening and stretching exercises to help regain strength and a full range of motion. These exercises may be completed at home or with a therapist. Your therapist will decide when you may return to sports. MEDICATION   If pain medication is necessary, then nonsteroidal anti-inflammatory medications, such as aspirin and ibuprofen, or other minor pain relievers, such as acetaminophen, are often recommended.  Do not take pain medication for 7 days before surgery.  Prescription pain relievers may be given if deemed necessary by your caregiver. Use only as directed and only as much as you need. HEAT AND COLD  Cold treatment (icing) relieves pain and reduces inflammation. Cold treatment should be applied for 10 to 15 minutes every 2 to 3 hours for inflammation and pain and immediately after any activity that aggravates your  symptoms. Use ice packs or massage the area with a piece of ice (ice massage).  Heat treatment may be used prior to performing the stretching and strengthening activities prescribed by your caregiver, physical therapist, or athletic trainer. Use a heat pack or soak your injury in warm water. SEEK MEDICAL CARE IF:  Treatment seems to offer no benefit, or the condition worsens.  Any medications produce adverse side effects.  Any complications from surgery occur:  Pain, numbness, or coldness in the extremity operated upon.  Discoloration of the nail beds (they become blue or gray) of the extremity operated upon.  Signs of infections (fever, pain, inflammation, redness, or persistent bleeding). EXERCISES RANGE OF MOTION (ROM) AND STRETCHING EXERCISES - Triceps Tendinitis These exercises may help you when beginning to rehabilitate your injury. Your symptoms may resolve with or without further involvement from your physician, physical therapist or athletic trainer. While completing these exercises, remember:   Restoring tissue flexibility helps normal motion to return to the joints. This allows healthier, less painful movement and activity.  An effective stretch should be held for at least 30 seconds.  A stretch should never be painful. You should only feel a gentle lengthening or release in the stretched tissue. RANGE OF MOTION - Flexion  Hold your right / left arm at your side and bend your elbow as far as you can using your right / left arm muscles.  Bend the right / left elbow farther by gently pushing up on your forearm until you feel a gentle stretch on the outside of your elbow. Hold this position for __________ seconds.  Slowly return to the starting position. Repeat __________ times. Complete this exercise __________ times per day.  RANGE OF MOTION - Elbow Flexion, Supine   Lie on your back. Extend your right / left arm into the air, bracing it with your opposite hand. Allow  your right / left arm to relax.  Let your elbow bend, allowing your hand fall slowly toward your chest.  You should feel a gentle stretch along the back of your upper arm and/or elbow. Your physician, physical therapist or athletic trainer may ask you to hold a __________ hand weight to increase the intensity of this stretch.  Hold for __________ seconds. Slowly return your right / left arm to the upright position. Repeat __________ times. Complete this exercise __________ times per day. STRETCH - Elbow Flexors   Lie on a firm bed or countertop on your back. Be sure that you are in a comfortable position which will allow you to relax your arm muscles.  Place a folded towel under your upper arm so that your elbow and shoulder are at the same height. Extend your arm; your elbow should not rest on the bed or towel  Allow the weight of your hand to straighten your elbow. Keep your arm and chest muscles relaxed. Your caretaker may ask you to increase the intensity of your stretch by adding a small wrist or hand weight.  Hold for __________ seconds. You should feel a stretch on the inside of your elbow. Slowly return to the starting position. Repeat __________ times. Complete this exercise __________ times per day. STRENGTHENING EXERCISES - Triceps Tendinitis These exercises will help you regain your strength. These exercises may resolve your symptoms  with or without further involvement from your physician, physical therapist or athletic trainer. While completing these exercises, remember:   Muscles can gain both the endurance and the strength needed for everyday activities through controlled exercises.  Complete these exercises as instructed by your physician, physical therapist or athletic trainer. Progress with the resistance and repetition exercises only as your caregiver advises.  You may experience muscle soreness or fatigue, but the pain or discomfort you are trying to eliminate should  never worsen during these exercises. If this pain does worsen, stop and make certain you are following the directions exactly. If the pain is still present after adjustments, discontinue the exercise until you can discuss the trouble with your clinician. STRENGTH - Elbow Extensors, Isometric  Stand or sit upright on a firm surface. Place your right / left arm so that your palm faces your abdomen and it is at the height of your waist.  Place your opposite hand on the underside of your forearm. Gently push up as your right / left arm resists. Push as hard as you can with both arms without causing any pain or movement at your right / left elbow. Hold this stationary position for __________ seconds.  Gradually release the tension in both arms. Allow your muscles to relax completely before repeating. Repeat __________ times. Complete this exercise __________ times per day. STRENGTH - Elbow Flexors, Supinated  With good posture, stand or sit on a firm chair without armrests. Allow your right / left arm to rest at your side with your palm facing forward.  Holding a __________ weight or gripping a rubber exercise band/tubing, bring your hand toward your shoulder.  Allow your muscles to control the resistance as your hand returns to your side. Repeat __________ times. Complete this exercise __________ times per day.  STRENGTH - Elbow Flexors, Neutral  With good posture, stand or sit on a firm chair without armrests. Allow your right / left arm to rest at your side with your thumb facing forward.  Holding a __________ weight or gripping a rubber exercise band/tubing, bring your hand toward your shoulder.  Allow your muscles to control the resistance as your hand returns to your side. Repeat __________ times. Complete this exercise __________ times per day.  STRENGTH - Elbow Extensors  Lie on your back. Extend your right / left elbow into the air, pointing it toward the ceiling. Brace your arm with  your opposite hand.*  Holding a __________ weight in your hand, slowly straighten your right / left elbow.  Allow your muscles to control the weight as your hand returns to its starting position. Repeat __________ times. Complete this exercise __________ times per day. *You may also stand with your elbow overhead and pointed toward the ceiling and supported by your opposite hand. STRENGTH - Elbow Extensors, Dynamic  With good posture, stand or sit on a firm chair without armrests. Keeping your upper arms at your side, bring both hands up to your right / left shoulder while gripping a rubber exercise band/tubing. Your right / left hand should be just below the other hand.  Straighten your right / left elbow. Hold for __________ seconds.  Allow your muscles to control the rubber exercise band/tubing as your hand returns to your shoulder. Repeat __________ times. Complete this exercise __________ times per day. Document Released: 06/27/2005 Document Revised: 09/19/2011 Document Reviewed: 10/09/2008 [facility] Patient Information 2015 [place], [place]. This information is not intended to replace advice given to you by your health care provider.  Make sure you discuss any questions you have with your health care provider. ° °

## 2014-12-19 NOTE — ED Notes (Signed)
Pt reports intermittent left arm pain "cramping sensation" lasts a few minutes. Denies nausea, vomiting, SOB, weakness, dizziness with the pain. Pt has LVAD. Denies chest pain/discomfort. Placed on cardiac monitor.

## 2014-12-19 NOTE — ED Provider Notes (Signed)
CSN: [id number]     Arrival date & time 12/19/14  1002 History   First MD Initiated Contact with Patient 12/19/14 1002     Chief Complaint  Patient presents with  . Arm Pain   HPI  Andre Tran is a 54 yo M with PMHx of chronic systolic CHF likely 2/2 chemotherapy now s/p LVAD 10/13/14, NHL s/p chemotherapy, h/o CVA 2008, VT/VF s/p ICD, PSVT, T2DM, and spontaneous ICH on 5/17 who presents with complaint of left arm pain. Patient states pain came on all of a sudden located over the posterior elbow radiating proximally in tricep. Pain was severe at first, but improved on its own. About one hour later, pain returned and is currently a 6/10. Patient has not tried anything for the pain. Pain is constant, throbbing. Nothing seems to make the pain worse. He is unsure if anything makes it better. Patient denies any history of trauma, new/different activity, rash, infection, bites, history of gout or arthritis.   He denies any associated symptoms- no chest pain, shortness of breath, nausea, vomiting. No headache, change in vision or hearing, slurred speech, new decreased sensation, new decreased strength. No numbness of tingling.   Past Medical History  Diagnosis Date  . Paroxysmal ventricular tachycardia 2005, 2010    s/p AICD '05, replaced w/ St. Jude's in 2010 (PVT/V.Fib arrest requiring ICD implant in 2005)  . HYPERTENSION, UNSPECIFIED   . HYPERLIPIDEMIA-MIXED   . 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 Nonischemic cardiomyopathy     2/2 Adriamycin administration for lymphoma  . CHF (congestive heart failure)     EF 15% by echo 2009; s/p St. Jude ICD  . Diabetes mellitus     Type 2  . Cancer 1992, 2007     non hodkins lymphoma 1992, Hodgkins 2007  . Depression   . LV (left ventricular) mural thrombus 2009    2009, on coumadin  . Small bowel obstruction 2001    s/p Small bowel resection  2001  . Jejunal intussusception 2008    2008  . 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 Splenic mass 2009    Noted on Abd [place] 2009 w/ recs for f/u CT - not done  . Hypotension   . Noncompliance with medication regimen   . AICD (automatic cardioverter/defibrillator) present 2005, 2010  . Presence of permanent cardiac pacemaker    Past Surgical History  Procedure Laterality Date  . Cardiac defibrillator placement      2005, replaced w/ St. Jude's 2010  . Vasectomy    . Bowel resection      small bowell 2/2 obstruction 2001  . Pacemaker insertion    . Insert / replace / remove pacemaker    . Colonoscopy N/A 09/18/2014    Procedure: COLONOSCOPY;  Surgeon: [name], MD;  Location: [facility] ENDOSCOPY;  Service: Endoscopy;  Laterality: N/A;  . Esophagogastroduodenoscopy N/A 09/18/2014    Procedure: ESOPHAGOGASTRODUODENOSCOPY (EGD);  Surgeon: [name], MD;  Location: [facility] ENDOSCOPY;  Service: Endoscopy;  Laterality: N/A;  . Left and right heart catheterization with coronary angiogram N/A 09/19/2014    Procedure: LEFT AND RIGHT HEART CATHETERIZATION WITH CORONARY ANGIOGRAM;  Surgeon: Jolaine Artist, MD;  Location: [facility] CATH LAB;  Service: Cardiovascular;  Laterality: N/A;  . Colonoscopy N/A 09/19/2014    Procedure: COLONOSCOPY;  Surgeon:  [name], MD;  Location: [place];  Service: Gastroenterology;  Laterality: N/A;  . Insertion of implantable left ventricular assist device N/A 10/13/2014    Procedure: INSERTION OF IMPLANTABLE LEFT VENTRICULAR ASSIST DEVICE;  Surgeon: [name], MD;  Location: [place];  Service: Open Heart Surgery;  Laterality: N/A;  CIRC ARREST  NITRIC OXIDE  . Tee without cardioversion N/A 10/13/2014    Procedure: TRANSESOPHAGEAL ECHOCARDIOGRAM (TEE);  Surgeon: [name], MD;  Location: [place];  Service: Open Heart Surgery;  Laterality: N/A;  . Exploration post operative open heart N/A 10/13/2014    Procedure: EXPLORATION POST  OPERATIVE OPEN HEART;  Surgeon: [name], MD;  Location: [place] OR;  Service: Open Heart Surgery;  Laterality: N/A;   Family History  Problem Relation Age of Onset  . Other      No known family h/o heart disease  . Heart disease    . Diabetes Mother   . Other Other     complications from hip replacement   History  Substance Use Topics  . Smoking status: Never Smoker   . Smokeless tobacco: Never Used  . Alcohol Use: 1.2 oz/week    1 Cans of beer, 1 Shots of liquor per week     Comment: ON SPECIAL OCCASIONS    Review of Systems General: Denies fever, chills, fatigue, diaphoresis.  Respiratory: Denies SOB, cough, DOE, chest tightness  Cardiovascular: Denies chest pain and palpitations.  Gastrointestinal: Denies nausea, vomiting Musculoskeletal: Admits to left arm pain/elbow pain. Admits to chronic gait problem from CVA. Denies back pain, joint swelling. Skin: Denies rash and wounds.  Neurological: Chronic right sided weakness and decreased sensation from CVA. Denies dizziness, headaches, weakness, lightheadedness, numbness Psychiatric/Behavioral: Denies mood changes, confusion  Allergies  Review of patient's allergies indicates no known allergies.  Home Medications   Prior to Admission medications   Medication Sig Start Date End Date Taking? Authorizing Provider  acetaminophen (TYLENOL) 325 MG tablet Take 325 mg by mouth every 6 (six) hours as needed for moderate pain.   Yes Historical Provider, MD  albuterol (PROVENTIL HFA;VENTOLIN HFA) 108 (90 BASE) MCG/ACT inhaler Inhale 1-2 puffs into the lungs every 6 (six) hours as needed for wheezing or shortness of breath.   Yes Historical Provider, MD  amiodarone (PACERONE) 200 MG tablet Take 1 tablet (200 mg total) by mouth daily. 10/24/14  Yes Amy D Clegg, NP  atorvastatin (LIPITOR) 40 MG tablet Take 0.5 tablets (20 mg total) by mouth at bedtime. 09/04/14  Yes Larey Dresser, MD  bisacodyl (DULCOLAX) 5 MG EC tablet Take 1 tablet (5  mg total) by mouth daily as needed for moderate constipation. 09/04/14  Yes Larey Dresser, MD  hydrALAZINE (APRESOLINE) 100 MG tablet Take 100 mg by mouth 3 (three) times daily.   Yes Historical Provider, MD  insulin glargine (LANTUS) 100 UNIT/ML injection Inject 0.05 mLs (5 Units total) into the skin at bedtime. 10/24/14  Yes Amy D Clegg, NP  isosorbide mononitrate (IMDUR) 30 MG 24 hr tablet Take 1 tablet (30 mg total) by mouth daily. 10/24/14  Yes Amy D Ninfa Meeker, NP  Magnesium 400 MG TABS Take 1 tablet by mouth daily with breakfast. 10/30/14  Yes Amy D Clegg, NP  pantoprazole (PROTONIX) 40 MG tablet Take 1 tablet (40 mg total) by mouth daily. 10/24/14  Yes Amy D Clegg, NP  traMADol (ULTRAM) 50 MG tablet Take 1-2 tablets (50-100 mg total) by mouth every 6 (six) hours as needed for  moderate pain. 10/24/14  Yes Amy D Clegg, NP  warfarin (COUMADIN) 5 MG tablet Take 0.5 tablets (2.5 mg total) by mouth one time only at 6 PM. Take  1/2 tablets daily. Patient taking differently: Take 2.5-5 mg by mouth daily. 5mg  MWF, 2.5mg  all other days 11/28/14  Yes Amy D Ninfa Meeker, NP   Physical Exam  Filed Vitals:   12/19/14 1030 12/19/14 1045 12/19/14 1100 12/19/14 1115  BP:      Pulse: 85 74 72 73  Temp:      TempSrc:      Resp: 24 16 17 19   Weight:      SpO2: 100% 100% 98% 100%   General: Vital signs reviewed.  Patient is well-developed and well-nourished, in no acute distress and cooperative with exam.  Eyes: EOMI, PERRLA.  Cardiovascular: LVAD in place. RRR. Pulmonary/Chest: Clear to auscultation bilaterally, no wheezes, rales, or rhonchi. Abdominal: Soft, non-tender, non-distended, BS + Musculoskeletal: Mild tenderness on palpation of left distal tricep. No tenderness on palpation of left shoulder or left elbow. No joint deformities, edema, erythema, or stiffness, ROM full and nontender. Normal strength 5/5. 2+ radial pulse on left. No ecchymoses, lesions, signs of infection, or rash.  Extremities: No lower  extremity edema bilaterally Neurological: A&O x3, 4/5 strength in right upper and lower extremities, but 5/5 on left. Decreased sensation on the right as compared to the left over face, right upper and lower extremities. Cranial nerve II-XII are grossly intact. Skin: Warm, dry and intact. No rashes or erythema. Psychiatric: Normal mood and affect. speech and behavior is normal. Cognition and memory are normal.   ED Course  Procedures (including critical care time) Labs Review Labs Reviewed  BASIC METABOLIC PANEL - Abnormal; Notable for the following:    Glucose, Bld 136 (*)    All other components within normal limits  CBC - Abnormal; Notable for the following:    RBC 3.45 (*)    Hemoglobin 9.9 (*)    HCT 30.3 (*)    All other components within normal limits  PROTIME-INR - Abnormal; Notable for the following:    Prothrombin Time 18.8 (*)    INR 1.57 (*)    All other components within normal limits  LACTATE DEHYDROGENASE - Abnormal; Notable for the following:    LDH 292 (*)    All other components within normal limits  D-DIMER, QUANTITATIVE (NOT AT [facility]) - Abnormal; Notable for the following:    D-Dimer, Quant 2.50 (*)    All other components within normal limits  BRAIN NATRIURETIC PEPTIDE  I-STAT TROPOININ, ED   Imaging Review Dg Chest Port 1 View  12/19/2014   CLINICAL DATA:  Left chest and arm pain. Chronic systolic congestive heart failure. Left ventricular assist device. Intracranial hemorrhage.  EXAM: PORTABLE CHEST - 1 VIEW  COMPARISON:  11/24/2014  FINDINGS: Left ventricular assist device and pacemaker remain appropriate position. Heart size is stable. Both lungs are clear. No evidence of pneumothorax or pleural effusion.  IMPRESSION: Stable exam.  No acute findings.   Electronically Signed   By: Earle Gell M.D.   On: 12/19/2014 10:56     EKG Interpretation   Date/Time:  Friday December 19 2014 10:11:24 EDT Ventricular Rate:  75 PR Interval:  223 QRS Duration: 103 QT  Interval:  431 QTC Calculation: 481 R Axis:   -97 Text Interpretation:  Sinus or ectopic atrial rhythm Prolonged PR interval  Inferior infarct, old Similar to old  Baseline wander in lead(s) V1  Confirmed by Reather Converse  MD, JOSHUA ([id number]) on 12/19/2014 11:31:22 AM      MDM   Final diagnoses:  Triceps tendinitis   54 yo M with PMHx of chronic systolic CHF likely 2/2 chemotherapy now s/p LVAD 10/13/14, NHL s/p chemotherapy, h/o CVA 2008, VT/VF s/p ICD, PSVT, T2DM, and spontaneous ICH on 5/17 who presents with complaint of left arm pain located specifically over distal left tricep. Pain sounds most consistent with a muscular type given history and exam. There are no signs or symptoms to suggest infection, rash or injury. Left arm pain is not likely secondary to ACS given lack of chest pain, shortness of breath, EKG with acute ischemic changes. No signs or symptoms of recurrent CVA. Symptoms could be secondary to venous thrombosis; however, patient is on coumadin and there are no signs of edema. PT/INR pending. Tylenol for pain.  CBC shows hemoglobin 9.9, about at baseline. CXR shows no acute cardiopulmonary disease. Troponin negative. BMET normal.   D-dimer elevated at 2.50; however, patient has had elevated d-dimer in the past. Patient does have history of malignancy, CHF, and surgery which could be alternative explanations of elevated d-dimer. PT slightly subtherapeutic at 1.57 (goal 1.7-2.2). LVAD interrogation showed device was working properly. Case was discussed with Dr. Haroldine Laws who states patient is safe to be discharged home. Patient later admitted he had been doing planks at home which is likely the cause of his pain. Patient was given instructions to return to the ED and notify his PCP if he develops increasing pain or swelling in the area or if he develops other symptoms such as chest pain or shortness of breath. Patient can continue to take Tylenol for pain. Avoiding NSAIDs given recent ICH.    This case was discussed in full with Dr. Reather Converse, ED attending physician.   Osa Craver, DO PGY-1 Internal Medicine Resident Pager # [phone] 12/19/2014 12:15 PM      Fabyan Loughmiller Sherral Hammers, MD 12/19/14 [id number]  Elnora Morrison, MD 12/19/14 1700

## 2014-12-19 NOTE — Consult Note (Signed)
VAD TEAM Conbsult Note   HPI:     Andre Tran is a 54 y.o. 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 Underwent HMII LVAD placement 10/13/2014   Admitted April 1st 2016 for schedule HMII implant. Post implant he was taken back to the OR for bleeding otherwise his post operative course was uneventful. LVAD speed turned down to 9000 on the day of discharge. Discharge weight was 160 pounds.   Admitted 5/16 with headache and CT showed spontaneous intracerebral bleed with INR 3.1. Given vit k 5 and 2 u FFP. Repeat head CT the next am: showed Enlarging RIGHT posterior temporal lobe hematomas 3.2 cm, previously. 2.6 cm with similar local mass-effect. Seen by Dr. Arnoldo Morale in [place] and treated conservatively. Remained stable from a neuro perspective. Repeat CT prior to d/c was stable. ASA stopped and Warfarin restarted.  Presents to ER with left tricep pain after 2 days of doing planks. No focal weakness or swelling on exam. No new neuro findings. Labs ok. No gout or joint problems   VAD interrogation   Speed: 9000 Flow: 5.3 PI: 7.2 Power: 5.2 Alarms: few low voltage advisory alarms Events: having 6 - 20 events daily (baseline stable for him with his smaller LV)   Review of Systems: [y] = yes, [ ]  = no   General: Weight gain [ ] ; Weight loss [ ] ; Anorexia [ ] ; Fatigue [ ] ; Fever [ ] ; Chills [ ] ; Weakness [ ]   Cardiac: Chest pain/pressure [ ] ; Resting SOB [ ] ; Exertional SOB [ ] ; Orthopnea [ ] ; Pedal Edema [ ] ; Palpitations [ ] ; Syncope [ ] ; Presyncope [ ] ; Paroxysmal nocturnal dyspnea[ ]   Pulmonary: Cough [ ] ; Wheezing[ ] ; Hemoptysis[ ] ; Sputum [ ] ; Snoring [ ]   GI:  Vomiting[ ] ; Dysphagia[ ] ; Melena[ ] ; Hematochezia [ ] ; Heartburn[ ] ; Abdominal pain [ ] ; Constipation [ ] ; Diarrhea [ ] ; BRBPR [ ]   GU: Hematuria[ ] ; Dysuria [ ] ; Nocturia[ ]   Vascular: Pain in legs with walking [ ] ; Pain in feet with lying flat [ ] ; Non-healing sores [ ] ; Stroke [ ] ; TIA [ ] ; Slurred speech [ ] ;  Neuro: Headaches[ ] ; Vertigo[ ] ; Seizures[ ] ; Paresthesias[ ] ;Blurred vision [ ] ; Diplopia [ ] ; Vision changes [ ]   Ortho/Skin: Arthritis [ ] ; Joint pain [ ] ; Muscle pain Blue.Reese ]; Joint swelling [ ] ; Back Pain [ ] ; Rash [ ]   Psych: Depression[ ] ; Anxiety[ ]   Heme: Bleeding problems [ ] ; Clotting disorders [ ] ; Anemia [ ]   Endocrine: Diabetes [ ] ; Thyroid dysfunction[ ]   Home Medications Prior to Admission medications   Medication Sig Start Date End Date Taking? Authorizing Provider  acetaminophen (TYLENOL) 325 MG tablet Take 325 mg by mouth every 6 (six) hours as needed for moderate pain.   Yes Historical Provider, MD  albuterol (PROVENTIL HFA;VENTOLIN HFA) 108 (90 BASE) MCG/ACT inhaler Inhale 1-2 puffs into the lungs every 6 (six) hours as needed for wheezing or shortness of breath.   Yes Historical Provider, MD  amiodarone (PACERONE) 200 MG tablet Take 1 tablet (200 mg total) by mouth daily. 10/24/14  Yes Amy D Clegg, NP  atorvastatin (LIPITOR) 40 MG  tablet Take 0.5 tablets (20 mg total) by mouth at bedtime. 09/04/14  Yes Larey Dresser, MD  bisacodyl (DULCOLAX) 5 MG EC tablet Take 1 tablet (5 mg total) by mouth daily as needed for moderate constipation. 09/04/14  Yes Larey Dresser, MD  hydrALAZINE (APRESOLINE) 100 MG tablet Take 100 mg by mouth 3 (three) times daily.   Yes Historical Provider, MD  insulin glargine (LANTUS) 100 UNIT/ML injection Inject 0.05 mLs (5 Units total) into the skin at bedtime. 10/24/14  Yes Amy D Clegg, NP  isosorbide mononitrate (IMDUR) 30 MG 24 hr tablet Take 1 tablet (30 mg total) by mouth daily. 10/24/14  Yes Amy D Ninfa Meeker, NP  Magnesium 400 MG TABS Take 1  tablet by mouth daily with breakfast. 10/30/14  Yes Amy D Clegg, NP  pantoprazole (PROTONIX) 40 MG tablet Take 1 tablet (40 mg total) by mouth daily. 10/24/14  Yes Amy D Clegg, NP  traMADol (ULTRAM) 50 MG tablet Take 1-2 tablets (50-100 mg total) by mouth every 6 (six) hours as needed for moderate pain. 10/24/14  Yes Amy D Clegg, NP  warfarin (COUMADIN) 5 MG tablet Take 0.5 tablets (2.5 mg total) by mouth one time only at 6 PM. Take  1/2 tablets daily. Patient taking differently: Take 2.5-5 mg by mouth daily. 5mg  MWF, 2.5mg  all other days 11/28/14  Yes Amy Estrella Deeds, NP    Past Medical History: Past Medical History  Diagnosis Date  . Paroxysmal ventricular tachycardia 2005, 2010    s/p AICD '05, replaced w/ St. Jude's in 2010 (PVT/V.Fib arrest requiring ICD implant in 2005)  . HYPERTENSION, UNSPECIFIED   . HYPERLIPIDEMIA-MIXED   . 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 Nonischemic cardiomyopathy     2/2 Adriamycin administration for lymphoma  . CHF (congestive heart failure)     EF 15% by echo 2009; s/p St. Jude ICD  . Diabetes mellitus     Type 2  . Cancer 1992, 2007     non hodkins lymphoma 1992, Hodgkins 2007  . Depression   . LV (left ventricular) mural thrombus 2009    2009, on coumadin  . Small bowel obstruction 2001    s/p Small bowel resection 2001  . Jejunal intussusception 2008    2008  . 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 Splenic mass 2009    Noted on Abd [place] 2009 w/ recs for f/u CT - not done  . Hypotension   . Noncompliance with medication regimen   . AICD (automatic cardioverter/defibrillator) present 2005, 2010  . Presence of permanent cardiac pacemaker     Past Surgical History: Past Surgical History  Procedure Laterality Date  . Cardiac defibrillator placement      2005, replaced w/ St. Jude's 2010  . Vasectomy     . Bowel resection      small bowell 2/2 obstruction 2001  . Pacemaker insertion    . Insert / replace / remove pacemaker    . Colonoscopy N/A 09/18/2014    Procedure: COLONOSCOPY;  Surgeon: [name], MD;  Location: [facility] ENDOSCOPY;  Service: Endoscopy;  Laterality: N/A;  . Esophagogastroduodenoscopy N/A 09/18/2014    Procedure: ESOPHAGOGASTRODUODENOSCOPY (EGD);  Surgeon: [name], MD;  Location: [facility] ENDOSCOPY;  Service: Endoscopy;  Laterality: N/A;  . Left and right heart catheterization with coronary angiogram  N/A 09/19/2014    Procedure: LEFT AND RIGHT HEART CATHETERIZATION WITH CORONARY ANGIOGRAM;  Surgeon: Jolaine Artist, MD;  Location: [facility] CATH LAB;  Service: Cardiovascular;  Laterality: N/A;  . Colonoscopy N/A 09/19/2014    Procedure: COLONOSCOPY;  Surgeon: [name], MD;  Location: [place];  Service: Gastroenterology;  Laterality: N/A;  . Insertion of implantable left ventricular assist device N/A 10/13/2014    Procedure: INSERTION OF IMPLANTABLE LEFT VENTRICULAR ASSIST DEVICE;  Surgeon: [name], MD;  Location: [place];  Service: Open Heart Surgery;  Laterality: N/A;  CIRC ARREST  NITRIC OXIDE  . Tee without cardioversion N/A 10/13/2014    Procedure: TRANSESOPHAGEAL ECHOCARDIOGRAM (TEE);  Surgeon: [name], MD;  Location: [place];  Service: Open Heart Surgery;  Laterality: N/A;  . Exploration post operative open heart N/A 10/13/2014    Procedure: EXPLORATION POST OPERATIVE OPEN HEART;  Surgeon: [name], MD;  Location: [place] OR;  Service: Open Heart Surgery;  Laterality: N/A;    Family History: Family History  Problem Relation Age of Onset  . Other      No known family h/o heart disease  . Heart disease    . Diabetes Mother   . Other Other     complications from hip replacement    Social History: History   Social History  . Marital Status: Married    Spouse Name: N/A  . Number of Children: N/A  . Years of Education: N/A   Social History Main Topics   . Smoking status: Never Smoker   . Smokeless tobacco: Never Used  . Alcohol Use: 1.2 oz/week    1 Cans of beer, 1 Shots of liquor per week     Comment: ON SPECIAL OCCASIONS  . Drug Use: No  . Sexual Activity: No   Other Topics Concern  . Not on file   Social History Narrative    Allergies:  No Known Allergies  Objective:    Vital Signs:   Temp:  [98.4 F (36.9 C)] 98.4 F (36.9 C) (06/10 1026) Pulse Rate:  [72-85] 73 (06/10 1115) Resp:  [16-24] 19 (06/10 1115) BP: (95)/(0) 95/0 mmHg (06/10 1026) SpO2:  [98 %-100 %] 100 % (06/10 1115) Weight:  [70.489 kg (155 lb 6.4 oz)] 70.489 kg (155 lb 6.4 oz) (06/10 1029)   Filed Weights   12/19/14 1029  Weight: 70.489 kg (155 lb 6.4 oz)    Mean arterial Pressure 82  Physical Exam: GENERAL: Well appearing, male who presents to clinic today in no acute distress with his wife.  HEENT: normal  NECK: Supple, JVP flat . 2+ bilaterally, no bruits. No lymphadenopathy or thyromegaly appreciated.  CARDIAC: Mechanical heart sounds with LVAD hum present.  LUNGS: Clear to auscultation bilaterally.  ABDOMEN: Soft, round, nontender, positive bowel sounds x4.  LVAD exit site: well-healed and incorporated. Dressing dry and intact. No erythema or drainage. Stabilization device present and accurately applied. Driveline dressing is being changed daily per sterile technique. EXTREMITIES: Warm and dry, no cyanosis, clubbing, rash or edema tender along left tricep NEUROLOGIC: Alert and oriented x 4. Gait   Telemetry: sinus 80  Labs: Basic Metabolic Panel:  Recent Labs Lab 12/19/14 1115  NA 135  K 3.7  CL 102  CO2 24  GLUCOSE 136*  BUN 13  CREATININE 0.96  CALCIUM 9.1    Liver Function Tests: No results for input(s): AST, ALT, ALKPHOS, BILITOT, PROT, ALBUMIN in the last 168 hours. No results for input(s): LIPASE,  AMYLASE in the last 168 hours. No results for input(s): AMMONIA in the last 168  hours.  CBC:  Recent Labs Lab 12/19/14 1115  WBC 8.6  HGB 9.9*  HCT 30.3*  MCV 87.8  PLT 265    Cardiac Enzymes: No results for input(s): CKTOTAL, CKMB, CKMBINDEX, TROPONINI in the last 168 hours.  BNP: BNP (last 3 results)  Recent Labs  10/14/14 0245 10/20/14 0407 11/05/14 1030  BNP 540.0* 535.3* 118.8*    ProBNP (last 3 results)  Recent Labs  06/12/14 1415 06/18/14 1200  PROBNP 2079.0* 2144.0*     CBG: No results for input(s): GLUCAP in the last 168 hours.  Coagulation Studies:  Recent Labs  12/17/14 1103 12/19/14 1115  LABPROT 17.8* 18.8*  INR 1.6* 1.57*    Other results:   Imaging: Dg Chest Port 1 View  12/19/2014   CLINICAL DATA:  Left chest and arm pain. Chronic systolic congestive heart failure. Left ventricular assist device. Intracranial hemorrhage.  EXAM: PORTABLE CHEST - 1 VIEW  COMPARISON:  11/24/2014  FINDINGS: Left ventricular assist device and pacemaker remain appropriate position. Heart size is stable. Both lungs are clear. No evidence of pneumothorax or pleural effusion.  IMPRESSION: Stable exam.  No acute findings.   Electronically Signed   By: Earle Gell M.D.   On: 12/19/2014 10:56         Assessment:   1. Left tricep pain 2. Chronic systolic HF s/p LVAD placement 3.Recent ICH  Plan/Discussion:     His arm pain seems musculoskeletal in nature. Can take Tylenol as needed. Avoid planks with recent VAD surgery. Will follow in Clinic. VAD parameters stable.    I reviewed the LVAD parameters from today, and compared the results to the patient's prior recorded data.  No programming changes were made.  The LVAD is functioning within specified parameters.  The patient performs LVAD self-test daily.  LVAD interrogation was negative for any significant power changes, alarms or PI events/speed drops.  LVAD equipment check completed and is in good working order.  Back-up equipment present.   LVAD education done on emergency  procedures and precautions and reviewed exit site care.  Length of Stay:   Glori Bickers MD 12/19/2014, 11:56 AM  VAD Team Pager [phone] (7am - 7am) +++VAD ISSUES ONLY+++   Advanced Heart Failure Team Pager [phone] (M-F; [place])  Please contact [facility] for night-coverage after hours (4p -7a ) and weekends on [email or web address] for all non- LVAD Issues

## 2014-12-19 NOTE — Progress Notes (Signed)
Presents to the ED today with his wife. She informed me on the phone that he was having severe pain to left arm and that he was breathing "differently" at home. He tolerated a few episodes of this pain and then with another episode asked her to bring him to the ED for evaluation.   LVAD Interrogation Revealed:  Speed: 9000 Flow: 5.3 PI: 7.2 Power: 5.2 Alarms: few low voltage advisory alarms Events: having 6 - 20 events daily (baseline stable for him with his smaller LV)  LVAD equipment appears to be functioning as expected and is negative for any power elevations or outstanding speed drops/PI events. Back up equipment is present and at bedside.   Exit Site: Biopatch is placed upside-down but otherwise C/D/I with Sorbaview dressing and drive line anchor. Education provided to wife. Was changed at home earlier this week.   Vital Signs: Tele: NSR w/ 1deg block 70s Doppler MAP: 94 Automatic Cuff: 88/66 (71)  SpO2: 100% on RA  Encounter Details: Currently he is not experiencing any CP/SOB. Describes the pain as intermittently and cramping on the outer upper aspect of his arm between the elbow and shoulder. Rating it a 7/10, has not attempted anything to relieve it. Worsens with movement.   Does not appear swollen compared to right arm. Has some point tenderness near the left deltoid and appears muscular in cause. Recent INR was slightly sub-therapeutic at 1.6 (Goal 1.7 - 2.2) and he is not on ASA at all with recent spontaneous ICH. D/W Dr. Haroldine Laws and orders placed for DDimer, INR, LDH to add.   Awaiting lab work and Dr. Haroldine Laws to evaluate patient and D/W EDP.

## 2014-12-19 NOTE — ED Notes (Signed)
[name], rapid response LVAD team at bedside.

## 2014-12-19 NOTE — ED Notes (Signed)
IV attempt x2 unsuccessful. Pt with poor IV access will consult IV team

## 2014-12-19 NOTE — Telephone Encounter (Signed)
Called [name] to instruct her Jacquis has been released per Dr. Haroldine Laws and the ED is working on D/C paperwork. Also instructed her to give Uzoma 7.5 mg coumadin tonight to assist with getting INR up a little higher since it is slightly less than draw two days ago. Will recheck in clinic next week.

## 2014-12-22 ENCOUNTER — Telehealth: Payer: Self-pay | Admitting: Physician Assistant

## 2014-12-22 MED ORDER — HYDRALAZINE HCL 100 MG PO TABS: 100.0000 mg | ORAL_TABLET | Freq: Three times a day (TID) | ORAL | Status: DC

## 2014-12-22 NOTE — Telephone Encounter (Signed)
Patient's wife called after-hours voicemail. He needs refill of hydralazine. She says they were told by a pharmacist that it would be called in but the pharmacy doesn't have it. Chart reviewed. Last consult note 12/19/14 did not denote any med changes. She confirms he is still on hydralazine 100mg  TID. They request 90 day supply. Will send in refill. Dayna Dunn PA-C

## 2014-12-24 ENCOUNTER — Encounter (HOSPITAL_COMMUNITY): Payer: Self-pay

## 2014-12-24 ENCOUNTER — Ambulatory Visit (HOSPITAL_COMMUNITY)
Admission: RE | Admit: 2014-12-24 | Discharge: 2014-12-24 | Disposition: A | Discharge status: Home / Self Care | Payer: Medicare Other | Source: Ambulatory Visit | Attending: Cardiology | Admitting: Cardiology

## 2014-12-24 ENCOUNTER — Other Ambulatory Visit (HOSPITAL_COMMUNITY): Payer: Self-pay | Admitting: Infectious Diseases

## 2014-12-24 ENCOUNTER — Ambulatory Visit (HOSPITAL_COMMUNITY): Payer: Self-pay | Admitting: Infectious Diseases

## 2014-12-24 VITALS — BP 94/67 | HR 84 | Ht 67.0 in | Wt 157.0 lb

## 2014-12-24 DIAGNOSIS — Z95811 Presence of heart assist device: Secondary | ICD-10-CM | POA: Insufficient documentation

## 2014-12-24 DIAGNOSIS — I5022 Chronic systolic (congestive) heart failure: Secondary | ICD-10-CM | POA: Insufficient documentation

## 2014-12-24 DIAGNOSIS — E119 Type 2 diabetes mellitus without complications: Secondary | ICD-10-CM | POA: Insufficient documentation

## 2014-12-24 DIAGNOSIS — Z79899 Other long term (current) drug therapy: Secondary | ICD-10-CM

## 2014-12-24 DIAGNOSIS — Z7901 Long term (current) use of anticoagulants: Secondary | ICD-10-CM

## 2014-12-24 LAB — PROTIME-INR
INR: 2.31 — AB (ref 0.00–1.49)
Prothrombin Time: 25.2 seconds — ABNORMAL HIGH (ref 11.6–15.2)

## 2014-12-24 LAB — CBC
HEMATOCRIT: 32.4 % — AB (ref 39.0–52.0)
Hemoglobin: 10.5 g/dL — ABNORMAL LOW (ref 13.0–17.0)
MCH: 28.8 pg (ref 26.0–34.0)
MCHC: 32.4 g/dL (ref 30.0–36.0)
MCV: 89 fL (ref 78.0–100.0)
Platelets: 287 10*3/uL (ref 150–400)
RBC: 3.64 MIL/uL — ABNORMAL LOW (ref 4.22–5.81)
RDW: 15.4 % (ref 11.5–15.5)
WBC: 7.7 10*3/uL (ref 4.0–10.5)

## 2014-12-24 LAB — T4, FREE: Free T4: 1.31 ng/dL — ABNORMAL HIGH (ref 0.61–1.12)

## 2014-12-24 LAB — TSH: TSH: 1.166 u[IU]/mL (ref 0.350–4.500)

## 2014-12-24 MED ORDER — WARFARIN SODIUM 5 MG PO TABS: 2.5000 mg | ORAL_TABLET | Freq: Every day | ORAL | Status: DC

## 2014-12-24 NOTE — Patient Instructions (Addendum)
1. Continue weekly dressings--site looks good. If you notice drainage/odor/pain at the site notify us and change back to daily kits and notify us.  2. INR today is 2.3 continue current regimen for coumadin and will recheck in 1 week at [facility]). You can go in anytime during 8 - 5 to have your labs drawn on Monday 12/29/14   Address: [address], [place], [place] [address]    [phone] 3. Try Tylenol 350 mg 2 tablet every 6 - 8 hours for pain / headaches. (Do not exceed 4000 mg in 24 hours). Should resolve overtime as you get used to the medication.  4. All other blood work looks great.  5. We will need to schedule you for an echo (heart ultrasound) to assess the current speed setting since you are having so many PI events.    [facility] [place] [address] [phone]

## 2014-12-24 NOTE — Progress Notes (Signed)
Patient ID: Andre Tran, male   DOB: Sep 09, 1960, 54 y.o.   MRN: [id number]   HPI: Andre Tran is a 54 y.o. 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 Underwent HMII LVAD placement 10/13/2014   Admitted April 1st 2016 for schedule HMII implant. Post implant he was taken back to the OR for bleeding otherwise his post operative course was uneventful. LVAD speed turned down to 9000 on the day of discharge. Discharge weight was 160 pounds.   Admitted 5/16 with headache and CT showed spontaneous intracerebral bleed with INR 3.1. Given vit k 5 and 2 u FFP. Repeat head CT the next am: showed Enlarging RIGHT posterior temporal lobe hematomas 3.2 cm, previously 2.6 cm with similar local mass-effect. Seen by Dr. Arnoldo Morale in [place] and treated conservatively. Remained stable from a neuro perspective. Repeat CT prior to d/c was stable. ASA stopped and Warfarin restarted.  Follow up for Heart Failure/LVAD: Continues to do very well s/p recent VAD placement. Seen in ER recently for tricep pain after doing planks. Weight at home stable. BP improved after hydralazine increased . Denies SOB/PND/Orthopnea. Denies dizziness. Not hungry. Wife changing dressing daily. Following low salt diet. Taking all medications.  Was stressed out yesterday due to wife going to ER with vertigo.     Denies driveline trauma, erythema or drainage.  Denies ICD shocks.   Reports taking Coumadin as prescribed and adherence to anticoagulation based dietary restrictions.  Denies bright red blood per rectum or melena, no dark urine or hematuria.     Past Medical History  Diagnosis Date  . Paroxysmal ventricular tachycardia 2005,  2010    s/p AICD '05, replaced w/ St. Jude's in 2010 (PVT/V.Fib arrest requiring ICD implant in 2005)  . HYPERTENSION, UNSPECIFIED   . HYPERLIPIDEMIA-MIXED   . 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 Nonischemic cardiomyopathy     2/2 Adriamycin administration for lymphoma  . CHF (congestive heart failure)     EF 15% by echo 2009; s/p St. Jude ICD  . Diabetes mellitus     Type 2  . Cancer 1992, 2007     non hodkins lymphoma 1992, Hodgkins 2007  . Depression   . LV (left ventricular) mural thrombus 2009    2009, on coumadin  . Small bowel obstruction 2001    s/p Small bowel resection 2001  . Jejunal intussusception 2008    2008  . 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 Splenic mass 2009    Noted on Abd [place] 2009 w/ recs for f/u CT - not done  . Hypotension   . Noncompliance with medication regimen   . AICD (automatic cardioverter/defibrillator) present 2005, 2010  . Presence of permanent cardiac pacemaker     Current Outpatient Prescriptions  Medication Sig Dispense Refill  . acetaminophen (TYLENOL) 650 MG CR tablet Take 1 tablet (650 mg total) by mouth every 8 (eight) hours as needed for pain. 14 tablet 0  . albuterol (PROVENTIL HFA;VENTOLIN HFA) 108 (90 BASE) MCG/ACT inhaler Inhale 1-2 puffs into the lungs every  6 (six) hours as needed for wheezing or shortness of breath.    Marland Kitchen amiodarone (PACERONE) 200 MG tablet Take 1 tablet (200 mg total) by mouth daily. 30 tablet 6  . atorvastatin (LIPITOR) 40 MG tablet Take 0.5 tablets (20 mg total) by mouth at bedtime. 30 tablet 6  . bisacodyl (DULCOLAX) 5 MG EC tablet Take 1 tablet (5 mg total) by mouth daily as needed for moderate constipation. 30 tablet 0  . hydrALAZINE (APRESOLINE) 100 MG tablet Take 1 tablet (100 mg total) by mouth 3 (three) times daily. 270 tablet 1  . insulin glargine  (LANTUS) 100 UNIT/ML injection Inject 0.05 mLs (5 Units total) into the skin at bedtime. 10 mL 11  . isosorbide mononitrate (IMDUR) 30 MG 24 hr tablet Take 1 tablet (30 mg total) by mouth daily. 30 tablet 6  . Magnesium 400 MG TABS Take 1 tablet by mouth daily with breakfast. 90 tablet 3  . pantoprazole (PROTONIX) 40 MG tablet Take 1 tablet (40 mg total) by mouth daily. 30 tablet 6  . traMADol (ULTRAM) 50 MG tablet Take 1-2 tablets (50-100 mg total) by mouth every 6 (six) hours as needed for moderate pain. 30 tablet 0  . warfarin (COUMADIN) 5 MG tablet Take 0.5-1 tablets (2.5-5 mg total) by mouth daily. 2.5 mg every day except 5 mg Monday and Wednesday 45 tablet 3   No current facility-administered medications for this encounter.    Review of patient's allergies indicates no known allergies.  REVIEW OF SYSTEMS: All systems negative except as listed in HPI, PMH and Problem list.    LVAD interrogation & Equipment Check: Speed: 9000 Flow: 4.6 Power: 5.0 PI: 6.0 - 6.6 Alarms: one low voltage advisory  Events: Has had 20 - 30 daily. > 40 events 6/14 while wife was in ER (not drinking fluids as he normally does)  Fixed speed: 9000 Low speed limit: 8400 Primary Controller: Replace back up battery in 24 months. Back up controller: Replace back up battery in 24 months.    I reviewed the LVAD parameters from today, and compared the results to the patient's prior recorded data.  No programming changes were made.  The LVAD is functioning within specified parameters.  The patient performs LVAD self-test daily.  LVAD interrogation was negative for any significant power changes, alarms or PI events/speed drops.  LVAD equipment check completed and is in good working order.  Back-up equipment present.   LVAD education done on emergency procedures and precautions and reviewed exit site care.    Filed Vitals:   12/24/14 1018 12/24/14 1040  BP: 90/0 94/67  Pulse: 84   Height: 5\' 7"   (1.702 m)   Weight: 157 lb (71.215 kg)   SpO2: 97%    HR: 84 Doppler BP: 90 Auto BP: 94/67 (76) O2 Sat: 98% Wt: 157  Last wt: 157.2  Ht: 5'7"   Physical Exam: GENERAL: Well appearing, male who presents to clinic today in no acute distress with his wife.  HEENT: normal  NECK: Supple, JVP flat  .  2+ bilaterally, no bruits.  No lymphadenopathy or thyromegaly appreciated.   CARDIAC:  Mechanical heart sounds with LVAD hum present.  LUNGS:  Clear to auscultation bilaterally.  ABDOMEN:  Soft, round, nontender, positive bowel sounds x4.     LVAD exit site: well-healed and incorporated.  Dressing dry and intact.  No erythema or drainage.  Stabilization device present and accurately applied.  Driveline dressing is being changed daily per sterile technique. EXTREMITIES:  Warm and dry, no cyanosis, clubbing, rash or edema  NEUROLOGIC:  Alert and oriented x 4.  Gait steady.  No aphasia.  No dysarthria.  Affect pleasant.       ASSESSMENT AND PLAN:  1. Chronic systolic HF - Status post LVAD implantation for DT.   -Doing very well. NYHA I-II. Volume status looks fine. Arlyce Harman stopped at last visit as felt to be dry. Has multiple PI events which are sporadic. He has fluttering in jugular venous pulsations and I am suspicious for arrhythmia as cause. Will get ECG and interrogate ICD -BP improved Continue current dose of hydralazine and Imdur Check LDH, CMET, CBC,PT INR 2.  Anticoagulation management - INR goal 2.0-2.5 with recent ICH. Off ASA.  Will evaluate INR value today and follow up with patient for necessary changes.  See anticoagulation flow sheet  3. H/o Non Hodgkins Lymphoma s/p therapy 4.  H/O CVA 2008  5. H/O VT/VF --St Jude ICD 2010  6. DMII 7. 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 Recent ICH 5/16.  -stable no new neuro deficits   Evangelia Whitaker,MD 11:49 AM

## 2014-12-24 NOTE — Progress Notes (Signed)
Symptom  Yes  No  Details   Angina        x Activity:   Claudication        x       How far: walking one block (chronic)  Syncope        x When:   Stroke        x  Right sided residuals and neuro residuals   Orthopnea        x How many pillows:  1-5 for comfort   PND        x How often:  CPAP     N/A How many hrs:   Pedal edema        x   Abd fullness        x   N&V        x  appetite fair; eating full meals at times  Diaphoresis        x When:  Bleeding       x   Urine color   Dark yellow - yellow   SOB        x Activity:   Palpitations        x When:  ICD shock        x   Hospitlizaitons        x When/where/why:  ED visit       x       When/where/why:to [facility] ER 12/19/14 for LUE pain   Other MD        x When/who/why:  Activity         No limitations; home stairs, gardening, errands, walking 4.5 blocks 3 days a week.   Fluid    1 - 2 liters   Diet    Low sodium   Vital signs: HR: 84 Doppler BP: 90 Auto BP: 94/67 (76) O2 Sat:  98% Wt:  157  Last wt: 157.2  Ht: 5'7"  LVAD interrogation & Equipment Check: Speed:  9000  Flow:  4.6 Power: 5.0 PI: 6.0 - 6.6 Alarms: one low voltage advisory  Events: Has had 20 - 30 daily. > 40 events 6/14 while wife was in ER (not drinking fluids as he normally does)  Fixed speed:  9000 Low speed limit:  8400 Primary Controller:  Replace back up battery in  24 months. Back up controller:   Replace back up battery in 24 months.  I reviewed the LVAD parameters from today, and compared the results to the patient's prior recorded data. No programming changes were made. The LVAD is functioning within specified parameters. The patient performs LVAD self-test daily. LVAD interrogation was negative for any power changes or alarms.  He is having a high amount of PI events of which he is asymptomatic for. LV on last RAMP study was 4.2 cm on 9000. LVAD equipment check completed and is in good working order. Back-up equipment present. LVAD education  done on emergency procedures and precautions and reviewed exit site care. Pt is performing daily controller and system monitor self tests along with completing weekly and monthly maintenance for LVAD equipment. LVAD education done on emergency procedures and precautions and reviewed exit site care.   Encounter Details: Presents today with his wife for 33m F/U with his wife. Presented to the ED on 12/19/14 with LUE pain which wound up being a triceps strain from exercise--states that he has allowed the arm to rest and it feels better today. No complaints of HF symptoms  and is progressing well with his mobility and exercise tolerance. Requested for HgbA1C to be drawn today and sent to PCP since he has stopped his glipizide a while ago.   Complaining of some head aches from the hydralazine--currently 2/10. Has not taken anything for the pain at this point. Advised that he can take the Tylenol for the pain that it was safe. MAPs currently in 70s on this dose. (Doppler pressure matches auto systolic).   PI events are still extremely frequent (up to 40/day)--PI's remain moderate during events >5.5 usually with flows 3.2 - 4 recorded; they are occurring at various points throughout the day. Not on diuretic, LDH stable and no other s/s of hemolysis or HF symptoms. D/W Dr. Haroldine Laws and will perform RAMP speed study next visit to assess further. LV is small so may be positional obstructions augmented by inadequate hydration.   Last RAMP study on 10/1214 showed LVIDD 4.2 cm on 9000 RPMs w/o any AoV openings and midline septum. Will need to consider his lower INR goal (1.7 - 2.2) and PI of >7 currently as a factor if speed needs to be turned down more.  Device interrogated & EKG obtained after noticeable intermittent fluttering while looking at JVP--SR with first degree block noted in both. SJM interrogation showed some potential non sustained episodes but not recorded as they were <170bpm. Threshold for atrial event  recording lowered to 140 and will assess at another visit. (could be precipitating the PI events or could be caused by the PI events if these are mechanical obstruction episodes?).  Reports that they will be going to [facility] June 24 - 26 and July 21 - 24. Travel letter including nearest [place] center and 24h contact provided during this encounter.   3 month INTERMACs update at next visit in July and RAMP echo.   F/U in 3 weeks per Dr. Oscar La, RN [place] Coordinator  Office: [phone] 24/7 [place] Pager: [phone]

## 2014-12-25 ENCOUNTER — Encounter: Payer: Self-pay | Admitting: Infectious Diseases

## 2014-12-25 LAB — HEMOGLOBIN A1C
Hgb A1c MFr Bld: 5.5 % (ref 4.8–5.6)
Mean Plasma Glucose: 111 mg/dL

## 2014-12-25 NOTE — Progress Notes (Addendum)
Pt is scheduled for Limited RAMP Echo on 01/14/15 by Dr. Haroldine Laws to assess speed of LVAD.   CPT Code: 64314 ICD 10 Code: C76.701  Primary Insurance: Medicare ([place]) Secondary Insurance: Tricare ([place])  Orders entered and scheduled.

## 2014-12-29 ENCOUNTER — Other Ambulatory Visit: Payer: Medicare Other

## 2014-12-30 ENCOUNTER — Ambulatory Visit (HOSPITAL_COMMUNITY): Payer: Self-pay | Admitting: Infectious Diseases

## 2014-12-30 ENCOUNTER — Other Ambulatory Visit (INDEPENDENT_AMBULATORY_CARE_PROVIDER_SITE_OTHER): Payer: Medicare Other | Admitting: *Deleted

## 2014-12-30 ENCOUNTER — Telehealth (HOSPITAL_COMMUNITY): Payer: Self-pay | Admitting: Infectious Diseases

## 2014-12-30 DIAGNOSIS — Z7901 Long term (current) use of anticoagulants: Secondary | ICD-10-CM

## 2014-12-30 LAB — PROTIME-INR
INR: 3.5 ratio — ABNORMAL HIGH (ref 0.8–1.0)
Prothrombin Time: 37.6 s — ABNORMAL HIGH (ref 9.6–13.1)

## 2014-12-30 NOTE — Telephone Encounter (Signed)
Coming in today for blood work.

## 2015-01-01 NOTE — Telephone Encounter (Signed)
A user error has taken place: encounter opened in error, closed for administrative reasons.

## 2015-01-06 ENCOUNTER — Telehealth: Payer: Self-pay | Admitting: Infectious Diseases

## 2015-01-06 ENCOUNTER — Encounter: Payer: Self-pay | Admitting: Internal Medicine

## 2015-01-06 ENCOUNTER — Ambulatory Visit (INDEPENDENT_AMBULATORY_CARE_PROVIDER_SITE_OTHER): Payer: Medicare Other | Admitting: Internal Medicine

## 2015-01-06 ENCOUNTER — Other Ambulatory Visit (INDEPENDENT_AMBULATORY_CARE_PROVIDER_SITE_OTHER): Payer: Medicare Other

## 2015-01-06 VITALS — BP 82/0 | HR 86 | Ht 67.0 in | Wt 156.2 lb

## 2015-01-06 DIAGNOSIS — I4729 Other ventricular tachycardia: Secondary | ICD-10-CM

## 2015-01-06 DIAGNOSIS — I5022 Chronic systolic (congestive) heart failure: Secondary | ICD-10-CM

## 2015-01-06 DIAGNOSIS — Z7901 Long term (current) use of anticoagulants: Secondary | ICD-10-CM | POA: Diagnosis not present

## 2015-01-06 DIAGNOSIS — I513 Intracardiac thrombosis, not elsewhere classified: Secondary | ICD-10-CM

## 2015-01-06 DIAGNOSIS — I472 Ventricular tachycardia: Secondary | ICD-10-CM

## 2015-01-06 DIAGNOSIS — Z95810 Presence of automatic (implantable) cardiac defibrillator: Secondary | ICD-10-CM | POA: Diagnosis not present

## 2015-01-06 DIAGNOSIS — I10 Essential (primary) hypertension: Secondary | ICD-10-CM

## 2015-01-06 LAB — CUP PACEART INCLINIC DEVICE CHECK
Battery Remaining Longevity: 52.8 mo
Brady Statistic RV Percent Paced: 0 %
Date Time Interrogation Session: 20160628151000
HighPow Impedance: 35.8045
Lead Channel Impedance Value: 325 Ohm
Lead Channel Pacing Threshold Amplitude: 1 V
Lead Channel Pacing Threshold Pulse Width: 0.4 ms
Lead Channel Sensing Intrinsic Amplitude: 1.2 mV
Lead Channel Setting Pacing Amplitude: 2.5 V
MDC IDC MSMT LEADCHNL RA IMPEDANCE VALUE: 325 Ohm
MDC IDC MSMT LEADCHNL RV SENSING INTR AMPL: 6.2 mV
MDC IDC PG SERIAL: 593606
MDC IDC SET LEADCHNL RV PACING PULSEWIDTH: 0.4 ms
MDC IDC SET LEADCHNL RV SENSING SENSITIVITY: 0.5 mV
MDC IDC STAT BRADY RA PERCENT PACED: 0 %
Zone Setting Detection Interval: 280 ms
Zone Setting Detection Interval: 310 ms

## 2015-01-06 NOTE — Progress Notes (Signed)
HPI [name] returns today for followup. He is a pleasant 54 yo man with chronic systolic heart failure, s/p VF arrest, s/p ICD implant who developed worsening CHF and underwent LVAD insertion. He suffered a stroke with right sided HP and speech difficulties. He denies any recent ICD therapies. No other complaints today.  No Known Allergies   Current Outpatient Prescriptions  Medication Sig Dispense Refill  . acetaminophen (TYLENOL) 650 MG CR tablet Take 1 tablet (650 mg total) by mouth every 8 (eight) hours as needed for pain. 14 tablet 0  . albuterol (PROVENTIL HFA;VENTOLIN HFA) 108 (90 BASE) MCG/ACT inhaler Inhale 1-2 puffs into the lungs every 6 (six) hours as needed for wheezing or shortness of breath.    Marland Kitchen amiodarone (PACERONE) 200 MG tablet Take 1 tablet (200 mg total) by mouth daily. 30 tablet 6  . atorvastatin (LIPITOR) 40 MG tablet Take 0.5 tablets (20 mg total) by mouth at bedtime. 30 tablet 6  . bisacodyl (DULCOLAX) 5 MG EC tablet Take 1 tablet (5 mg total) by mouth daily as needed for moderate constipation. 30 tablet 0  . hydrALAZINE (APRESOLINE) 100 MG tablet Take 1 tablet (100 mg total) by mouth 3 (three) times daily. 270 tablet 1  . insulin glargine (LANTUS) 100 UNIT/ML injection Inject 0.05 mLs (5 Units total) into the skin at bedtime. 10 mL 11  . isosorbide mononitrate (IMDUR) 30 MG 24 hr tablet Take 1 tablet (30 mg total) by mouth daily. 30 tablet 6  . Magnesium 400 MG TABS Take 1 tablet by mouth daily with breakfast. 90 tablet 3  . pantoprazole (PROTONIX) 40 MG tablet Take 1 tablet (40 mg total) by mouth daily. 30 tablet 6  . RA ASPIRIN EC 325 MG EC tablet Take 325 mg by mouth daily.  0  . traMADol (ULTRAM) 50 MG tablet Take 1-2 tablets (50-100 mg total) by mouth every 6 (six) hours as needed for moderate pain. 30 tablet 0  . warfarin (COUMADIN) 5 MG tablet Take 0.5-1 tablets (2.5-5 mg total) by mouth daily. 2.5 mg every day except 5 mg Monday and Wednesday 45 tablet  3  . spironolactone (ALDACTONE) 25 MG tablet Take 25 mg by mouth daily as needed (fluid).     No current facility-administered medications for this visit.     Past Medical History  Diagnosis Date  . Paroxysmal ventricular tachycardia 2005, 2010    s/p AICD '05, replaced w/ St. Jude's in 2010 (PVT/V.Fib arrest requiring ICD implant in 2005)  . HYPERTENSION, UNSPECIFIED   . HYPERLIPIDEMIA-MIXED   . 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 Nonischemic cardiomyopathy     2/2 Adriamycin administration for lymphoma  . CHF (congestive heart failure)     EF 15% by echo 2009; s/p St. Jude ICD  . Diabetes mellitus     Type 2  . Cancer 1992, 2007     non hodkins lymphoma 1992, Hodgkins 2007  . Depression   . LV (left ventricular) mural thrombus 2009    2009, on coumadin  . Small bowel obstruction 2001    s/p Small bowel resection 2001  . Jejunal intussusception 2008    2008  . 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 Splenic mass 2009    Noted on Abd [place] 2009 w/ recs for f/u CT -  not done  . Hypotension   . Noncompliance with medication regimen   . AICD (automatic cardioverter/defibrillator) present 2005, 2010  . Presence of permanent cardiac pacemaker     ROS:   All systems reviewed and negative except as noted in the HPI.   Past Surgical History  Procedure Laterality Date  . Cardiac defibrillator placement      2005, replaced w/ St. Jude's 2010  . Vasectomy    . Bowel resection      small bowell 2/2 obstruction 2001  . Pacemaker insertion    . Insert / replace / remove pacemaker    . Colonoscopy N/A 09/18/2014    Procedure: COLONOSCOPY;  Surgeon: [name], MD;  Location: [facility] ENDOSCOPY;  Service: Endoscopy;  Laterality: N/A;  . Esophagogastroduodenoscopy N/A 09/18/2014    Procedure: ESOPHAGOGASTRODUODENOSCOPY (EGD);  Surgeon:  [name], MD;  Location: [facility] ENDOSCOPY;  Service: Endoscopy;  Laterality: N/A;  . Left and right heart catheterization with coronary angiogram N/A 09/19/2014    Procedure: LEFT AND RIGHT HEART CATHETERIZATION WITH CORONARY ANGIOGRAM;  Surgeon: Jolaine Artist, MD;  Location: [facility] CATH LAB;  Service: Cardiovascular;  Laterality: N/A;  . Colonoscopy N/A 09/19/2014    Procedure: COLONOSCOPY;  Surgeon: [name], MD;  Location: [place];  Service: Gastroenterology;  Laterality: N/A;  . Insertion of implantable left ventricular assist device N/A 10/13/2014    Procedure: INSERTION OF IMPLANTABLE LEFT VENTRICULAR ASSIST DEVICE;  Surgeon: [name], MD;  Location: [place];  Service: Open Heart Surgery;  Laterality: N/A;  CIRC ARREST  NITRIC OXIDE  . Tee without cardioversion N/A 10/13/2014    Procedure: TRANSESOPHAGEAL ECHOCARDIOGRAM (TEE);  Surgeon: [name], MD;  Location: [place];  Service: Open Heart Surgery;  Laterality: N/A;  . Exploration post operative open heart N/A 10/13/2014    Procedure: EXPLORATION POST OPERATIVE OPEN HEART;  Surgeon: [name], MD;  Location: [facility] [facility];  Service: Open Heart Surgery;  Laterality: N/A;     Family History  Problem Relation Age of Onset  . Other      No known family h/o heart disease  . Heart disease    . Diabetes Mother   . Other Other     complications from hip replacement     History   Social History  . Marital Status: Married    Spouse Name: N/A  . Number of Children: N/A  . Years of Education: N/A   Occupational History  . Not on file.   Social History Main Topics  . Smoking status: Never Smoker   . Smokeless tobacco: Never Used  . Alcohol Use: 1.2 oz/week    1 Cans of beer, 1 Shots of liquor per week     Comment: ON SPECIAL OCCASIONS  . Drug Use: No  . Sexual Activity: No   Other Topics Concern  . Not on file   Social History Narrative     BP 82/0 mmHg  Pulse 86  Ht 5\' 7"  (1.702 m)  Wt 156 lb 3.2 oz (70.852  kg)  BMI 24.46 kg/m2  Physical Exam:  Well appearing 54 yo man, NAD HEENT: Unremarkable Neck:  No JVD, no thyromegally Lymphatics:  No adenopathy Back:  No CVA tenderness Lungs:  Clear with no wheezes HEART:  Regular rate rhythm, no murmurs, no rubs, no clicks, high frequency hum is present from the LVAD Abd:  soft, positive bowel sounds, no organomegally, no rebound, no guarding Ext:  no edema, no  cyanosis, no clubbing Skin:  No rashes no nodules Neuro:  CN II through XII intact, motor grossly intact   DEVICE  Normal device function.  See PaceArt for details.   Assess/Plan:

## 2015-01-06 NOTE — Assessment & Plan Note (Signed)
His device is working normally. Will recheck in several months. 

## 2015-01-06 NOTE — Patient Instructions (Signed)
Medication Instructions:    Labwork:   Testing/Procedures:   Follow-Up:  Remote monitoring is used to monitor your Pacemaker of ICD from home. This monitoring reduces the number of office visits required to check your device to one time per year. It allows [place] to keep an eye on the functioning of your device to ensure it is working properly. You are scheduled for a device check from home on 04/07/15. You may send your transmission at any time that day. If you have a wireless device, the transmission will be sent automatically. After your physician reviews your transmission, you will receive a postcard with your next transmission date.   Your physician wants you to follow-up in:  [place] will receive a reminder letter in the mail two months in advance. If you don't receive a letter, please call our office to schedule the follow-up appointment.   Any Other Special Instructions Will Be Listed Below (If Applicable).

## 2015-01-06 NOTE — Telephone Encounter (Signed)
Called re: Andre Tran INR--She is at Dr. Tanna Furry office with Regino Schultze now and will ensure they have INR drawn. Informed her that we may not get results since the draw is late in the day until tomorrow.

## 2015-01-06 NOTE — Assessment & Plan Note (Signed)
His blood pressure is good. No change in meds.

## 2015-01-06 NOTE — Addendum Note (Signed)
Addended by: Stephannie Peters on: 01/06/2015 03:54 PM   Modules accepted: Orders

## 2015-01-06 NOTE — Assessment & Plan Note (Signed)
He is much improved since his LVAD was placed. Denies sob.

## 2015-01-07 ENCOUNTER — Other Ambulatory Visit: Payer: Medicare Other

## 2015-01-08 ENCOUNTER — Telehealth (HOSPITAL_COMMUNITY): Payer: Self-pay | Admitting: *Deleted

## 2015-01-08 ENCOUNTER — Other Ambulatory Visit: Payer: Medicare Other

## 2015-01-08 ENCOUNTER — Ambulatory Visit (HOSPITAL_COMMUNITY): Payer: Medicare Other

## 2015-01-08 DIAGNOSIS — Z95811 Presence of heart assist device: Secondary | ICD-10-CM

## 2015-01-08 DIAGNOSIS — Z7901 Long term (current) use of anticoagulants: Secondary | ICD-10-CM

## 2015-01-08 NOTE — Telephone Encounter (Signed)
Called pt re: missed INR appt 01/06/15; he confirms he did not have INR checked Tues or Wednesday of this week. He will go to [facility] today for INR check. Stressed importance of getting INR completed today. Appt re-scheduled, order entered.

## 2015-01-09 ENCOUNTER — Telehealth: Payer: Self-pay | Admitting: Infectious Diseases

## 2015-01-09 ENCOUNTER — Encounter (INDEPENDENT_AMBULATORY_CARE_PROVIDER_SITE_OTHER): Payer: Medicare Other

## 2015-01-09 ENCOUNTER — Other Ambulatory Visit (HOSPITAL_COMMUNITY): Payer: Self-pay | Admitting: Infectious Diseases

## 2015-01-09 ENCOUNTER — Ambulatory Visit: Payer: Self-pay | Admitting: Infectious Diseases

## 2015-01-09 DIAGNOSIS — Z95811 Presence of heart assist device: Secondary | ICD-10-CM | POA: Diagnosis not present

## 2015-01-09 DIAGNOSIS — Z7901 Long term (current) use of anticoagulants: Secondary | ICD-10-CM

## 2015-01-09 LAB — PROTIME-INR
INR: 1.2 ratio — AB (ref 0.8–1.0)
Prothrombin Time: 13.1 s (ref 9.6–13.1)

## 2015-01-09 NOTE — Telephone Encounter (Signed)
Called re: missed INR again. Unable to get blood the other day with draw and not able to accommodate into schedule yesterday. Will be going to [employer] [employer] today.

## 2015-01-09 NOTE — Telephone Encounter (Signed)
Called by [name] to report that Andre Tran is still having headaches since the dose increase with the Hydralazine to 100 mg TID. States that he is having to take Tylenol around the clock and it has not weaned in severity at all. Will discuss with next clinic visit this Wednesday if we can decrease Hydralazine to previously tolerated dose and add another agent in addition to or in substitution for the hydralazine. Verbalized that this plan is fine.   Reminded of upcoming trip to [facility] for his [age]. They already have contact information for [facility]. Will call VAD team to inform of patient's travel to area as it gets closer to date.

## 2015-01-12 ENCOUNTER — Observation Stay (HOSPITAL_COMMUNITY)
Admission: EM | Admit: 2015-01-12 | Discharge: 2015-01-13 | DRG: 103 | Disposition: A | Discharge status: Home / Self Care | Payer: Medicare Other | Attending: Cardiology | Admitting: Cardiology

## 2015-01-12 ENCOUNTER — Emergency Department (HOSPITAL_COMMUNITY): Payer: Medicare Other

## 2015-01-12 ENCOUNTER — Encounter (HOSPITAL_COMMUNITY): Payer: Self-pay | Admitting: Emergency Medicine

## 2015-01-12 ENCOUNTER — Telehealth: Payer: Self-pay | Admitting: Infectious Diseases

## 2015-01-12 DIAGNOSIS — E119 Type 2 diabetes mellitus without complications: Secondary | ICD-10-CM | POA: Diagnosis not present

## 2015-01-12 DIAGNOSIS — Z9221 Personal history of antineoplastic chemotherapy: Secondary | ICD-10-CM

## 2015-01-12 DIAGNOSIS — Z7901 Long term (current) use of anticoagulants: Secondary | ICD-10-CM

## 2015-01-12 DIAGNOSIS — Z95811 Presence of heart assist device: Secondary | ICD-10-CM

## 2015-01-12 DIAGNOSIS — I429 Cardiomyopathy, unspecified: Secondary | ICD-10-CM | POA: Diagnosis present

## 2015-01-12 DIAGNOSIS — I10 Essential (primary) hypertension: Secondary | ICD-10-CM | POA: Diagnosis present

## 2015-01-12 DIAGNOSIS — R51 Headache: Secondary | ICD-10-CM | POA: Diagnosis not present

## 2015-01-12 DIAGNOSIS — Z7982 Long term (current) use of aspirin: Secondary | ICD-10-CM

## 2015-01-12 DIAGNOSIS — I69351 Hemiplegia and hemiparesis following cerebral infarction affecting right dominant side: Secondary | ICD-10-CM

## 2015-01-12 DIAGNOSIS — G441 Vascular headache, not elsewhere classified: Secondary | ICD-10-CM

## 2015-01-12 DIAGNOSIS — Z86718 Personal history of other venous thrombosis and embolism: Secondary | ICD-10-CM | POA: Diagnosis not present

## 2015-01-12 DIAGNOSIS — R519 Headache, unspecified: Secondary | ICD-10-CM | POA: Diagnosis present

## 2015-01-12 DIAGNOSIS — I5022 Chronic systolic (congestive) heart failure: Secondary | ICD-10-CM | POA: Diagnosis not present

## 2015-01-12 DIAGNOSIS — E785 Hyperlipidemia, unspecified: Secondary | ICD-10-CM | POA: Diagnosis not present

## 2015-01-12 DIAGNOSIS — Z8572 Personal history of non-Hodgkin lymphomas: Secondary | ICD-10-CM

## 2015-01-12 DIAGNOSIS — Z79899 Other long term (current) drug therapy: Secondary | ICD-10-CM

## 2015-01-12 DIAGNOSIS — Z833 Family history of diabetes mellitus: Secondary | ICD-10-CM

## 2015-01-12 DIAGNOSIS — Z794 Long term (current) use of insulin: Secondary | ICD-10-CM

## 2015-01-12 LAB — BASIC METABOLIC PANEL
ANION GAP: 8 (ref 5–15)
BUN: 10 mg/dL (ref 6–20)
CHLORIDE: 103 mmol/L (ref 101–111)
CO2: 25 mmol/L (ref 22–32)
Calcium: 9 mg/dL (ref 8.9–10.3)
Creatinine, Ser: 0.82 mg/dL (ref 0.61–1.24)
GFR calc Af Amer: >60 mL/min (ref >60)
GFR calc non Af Amer: >60 mL/min (ref >60)
GLUCOSE: 110 mg/dL — AB (ref 65–99)
Potassium: 3.6 mmol/L (ref 3.5–5.1)
Sodium: 136 mmol/L (ref 135–145)

## 2015-01-12 LAB — CBC
HCT: 27.9 % — ABNORMAL LOW (ref 39.0–52.0)
HCT: 28.5 % — ABNORMAL LOW (ref 39.0–52.0)
HEMOGLOBIN: 9.2 g/dL — AB (ref 13.0–17.0)
Hemoglobin: 9.3 g/dL — ABNORMAL LOW (ref 13.0–17.0)
MCH: 28.5 pg (ref 26.0–34.0)
MCH: 28.3 pg (ref 26.0–34.0)
MCHC: 33 g/dL (ref 30.0–36.0)
MCHC: 32.6 g/dL (ref 30.0–36.0)
MCV: 86.4 fL (ref 78.0–100.0)
MCV: 86.6 fL (ref 78.0–100.0)
Platelets: 276 10*3/uL (ref 150–400)
Platelets: 292 10*3/uL (ref 150–400)
RBC: 3.23 MIL/uL — ABNORMAL LOW (ref 4.22–5.81)
RBC: 3.29 MIL/uL — AB (ref 4.22–5.81)
RDW: 14.5 % (ref 11.5–15.5)
RDW: 14.6 % (ref 11.5–15.5)
WBC: 7.2 10*3/uL (ref 4.0–10.5)
WBC: 6.2 10*3/uL (ref 4.0–10.5)

## 2015-01-12 LAB — GLUCOSE, CAPILLARY
Glucose-Capillary: 89 mg/dL (ref 65–99)
Glucose-Capillary: 88 mg/dL (ref 65–99)
Glucose-Capillary: 105 mg/dL — ABNORMAL HIGH (ref 65–99)
Glucose-Capillary: 117 mg/dL — ABNORMAL HIGH (ref 65–99)

## 2015-01-12 LAB — LACTATE DEHYDROGENASE
LDH: 225 U/L — AB (ref 98–192)
LDH: 245 U/L — ABNORMAL HIGH (ref 98–192)

## 2015-01-12 LAB — PROTIME-INR
INR: 1.35 (ref 0.00–1.49)
INR: 1.35 (ref 0.00–1.49)
PROTHROMBIN TIME: 16.8 s — AB (ref 11.6–15.2)
Prothrombin Time: 16.8 seconds — ABNORMAL HIGH (ref 11.6–15.2)

## 2015-01-12 LAB — CREATININE, SERUM
CREATININE: 0.95 mg/dL (ref 0.61–1.24)
GFR calc Af Amer: >60 mL/min (ref >60)
GFR calc non Af Amer: >60 mL/min (ref >60)

## 2015-01-12 LAB — MRSA PCR SCREENING: MRSA BY PCR: NEGATIVE

## 2015-01-12 LAB — SEDIMENTATION RATE: SED RATE: 30 mm/h — AB (ref 0–16)

## 2015-01-12 MED ORDER — SODIUM CHLORIDE 0.9 % IJ SOLN
3.0000 mL | Freq: Two times a day (BID) | INTRAMUSCULAR | Status: DC
  Administered 2015-01-12 (×2): 3 mL via INTRAVENOUS

## 2015-01-12 MED ORDER — SODIUM CHLORIDE 0.9 % IV SOLN: 250.0000 mL | INTRAVENOUS | Status: DC | PRN

## 2015-01-12 MED ORDER — MAGNESIUM OXIDE 400 (241.3 MG) MG PO TABS
400.0000 mg | ORAL_TABLET | Freq: Every day | ORAL | Status: DC
  Administered 2015-01-13: 400 mg via ORAL
  Filled 2015-01-12 (×2): qty 1

## 2015-01-12 MED ORDER — SODIUM CHLORIDE 0.9 % IJ SOLN: 3.0000 mL | INTRAMUSCULAR | Status: DC | PRN

## 2015-01-12 MED ORDER — AMIODARONE HCL 200 MG PO TABS
200.0000 mg | ORAL_TABLET | Freq: Every day | ORAL | Status: DC
  Administered 2015-01-12 – 2015-01-13 (×2): 200 mg via ORAL
  Filled 2015-01-12 (×2): qty 1

## 2015-01-12 MED ORDER — DIPHENHYDRAMINE HCL 50 MG/ML IJ SOLN
25.0000 mg | Freq: Once | INTRAMUSCULAR | Status: AC
  Administered 2015-01-12: 25 mg via INTRAMUSCULAR
  Filled 2015-01-12: qty 1

## 2015-01-12 MED ORDER — WARFARIN SODIUM 5 MG PO TABS
5.0000 mg | ORAL_TABLET | ORAL | Status: DC
  Filled 2015-01-12: qty 1

## 2015-01-12 MED ORDER — PANTOPRAZOLE SODIUM 40 MG PO TBEC
40.0000 mg | DELAYED_RELEASE_TABLET | Freq: Every day | ORAL | Status: DC
  Administered 2015-01-12 – 2015-01-13 (×2): 40 mg via ORAL
  Filled 2015-01-12 (×2): qty 1

## 2015-01-12 MED ORDER — WARFARIN SODIUM 2.5 MG PO TABS
2.5000 mg | ORAL_TABLET | Freq: Every day | ORAL | Status: DC
Start: 2015-01-12 — End: 2015-01-12

## 2015-01-12 MED ORDER — MORPHINE SULFATE 2 MG/ML IJ SOLN: 2.0000 mg | INTRAMUSCULAR | Status: DC | PRN

## 2015-01-12 MED ORDER — WARFARIN - PHARMACIST DOSING INPATIENT
Freq: Every day | Status: DC
  Administered 2015-01-12: 17:00:00

## 2015-01-12 MED ORDER — MAGNESIUM 400 MG PO TABS
1.0000 | ORAL_TABLET | Freq: Every day | ORAL | Status: DC
  Filled 2015-01-12: qty 1

## 2015-01-12 MED ORDER — TRAMADOL HCL 50 MG PO TABS: 50.0000 mg | ORAL_TABLET | Freq: Four times a day (QID) | ORAL | Status: DC | PRN

## 2015-01-12 MED ORDER — PROCHLORPERAZINE EDISYLATE 5 MG/ML IJ SOLN
10.0000 mg | Freq: Once | INTRAMUSCULAR | Status: AC
  Administered 2015-01-12: 10 mg via INTRAMUSCULAR
  Filled 2015-01-12: qty 2

## 2015-01-12 MED ORDER — VALPROATE SODIUM 500 MG/5ML IV SOLN
500.0000 mg | Freq: Once | INTRAVENOUS | Status: AC
  Administered 2015-01-12: 500 mg via INTRAVENOUS
  Filled 2015-01-12 (×2): qty 5

## 2015-01-12 MED ORDER — INSULIN GLARGINE 100 UNIT/ML ~~LOC~~ SOLN
5.0000 [IU] | Freq: Every day | SUBCUTANEOUS | Status: DC
  Filled 2015-01-12 (×2): qty 0.05

## 2015-01-12 MED ORDER — WARFARIN - PHYSICIAN DOSING INPATIENT: Freq: Every day | Status: DC

## 2015-01-12 MED ORDER — ACETAMINOPHEN 325 MG PO TABS: 650.0000 mg | ORAL_TABLET | Freq: Three times a day (TID) | ORAL | Status: DC | PRN

## 2015-01-12 MED ORDER — DIPHENHYDRAMINE HCL 50 MG/ML IJ SOLN
25.0000 mg | Freq: Once | INTRAMUSCULAR | Status: AC
  Administered 2015-01-12: 25 mg via INTRAVENOUS
  Filled 2015-01-12: qty 1

## 2015-01-12 MED ORDER — ATORVASTATIN CALCIUM 20 MG PO TABS
20.0000 mg | ORAL_TABLET | Freq: Every day | ORAL | Status: DC
  Administered 2015-01-12: 20 mg via ORAL
  Filled 2015-01-12 (×2): qty 1

## 2015-01-12 MED ORDER — AMLODIPINE BESYLATE 5 MG PO TABS
5.0000 mg | ORAL_TABLET | Freq: Every day | ORAL | Status: DC
  Administered 2015-01-12 – 2015-01-13 (×2): 5 mg via ORAL
  Filled 2015-01-12 (×2): qty 1

## 2015-01-12 MED ORDER — LISINOPRIL 5 MG PO TABS
5.0000 mg | ORAL_TABLET | Freq: Every day | ORAL | Status: DC
  Administered 2015-01-12 – 2015-01-13 (×2): 5 mg via ORAL
  Filled 2015-01-12 (×2): qty 1

## 2015-01-12 MED ORDER — ALBUTEROL SULFATE (2.5 MG/3ML) 0.083% IN NEBU
3.0000 mL | INHALATION_SOLUTION | Freq: Four times a day (QID) | RESPIRATORY_TRACT | Status: DC | PRN
Start: 2015-01-12 — End: 2015-01-13

## 2015-01-12 MED ORDER — METHOCARBAMOL 500 MG PO TABS
500.0000 mg | ORAL_TABLET | Freq: Four times a day (QID) | ORAL | Status: DC
  Administered 2015-01-12 – 2015-01-13 (×4): 500 mg via ORAL
  Filled 2015-01-12 (×8): qty 1

## 2015-01-12 MED ORDER — WARFARIN SODIUM 2.5 MG PO TABS: 2.5000 mg | ORAL_TABLET | ORAL | Status: DC

## 2015-01-12 MED ORDER — HEPARIN SODIUM (PORCINE) 5000 UNIT/ML IJ SOLN
5000.0000 [IU] | Freq: Three times a day (TID) | INTRAMUSCULAR | Status: DC
  Administered 2015-01-12 – 2015-01-13 (×3): 5000 [IU] via SUBCUTANEOUS
  Filled 2015-01-12 (×6): qty 1

## 2015-01-12 MED ORDER — BISACODYL 5 MG PO TBEC
5.0000 mg | DELAYED_RELEASE_TABLET | Freq: Every day | ORAL | Status: DC | PRN
Start: 2015-01-12 — End: 2015-01-13

## 2015-01-12 MED ORDER — OXYCODONE-ACETAMINOPHEN 5-325 MG PO TABS
1.0000 | ORAL_TABLET | ORAL | Status: DC | PRN
  Administered 2015-01-12 – 2015-01-13 (×2): 1 via ORAL
  Filled 2015-01-12 (×2): qty 1

## 2015-01-12 MED ORDER — SPIRONOLACTONE 25 MG PO TABS
25.0000 mg | ORAL_TABLET | Freq: Every day | ORAL | Status: DC | PRN
  Filled 2015-01-12: qty 1

## 2015-01-12 MED ORDER — WARFARIN SODIUM 5 MG PO TABS
5.0000 mg | ORAL_TABLET | Freq: Once | ORAL | Status: AC
  Administered 2015-01-12: 5 mg via ORAL
  Filled 2015-01-12: qty 1

## 2015-01-12 NOTE — ED Provider Notes (Signed)
CSN: [id number]     Arrival date & time 01/12/15  0734 History   First MD Initiated Contact with Patient 01/12/15 [phone]     Chief Complaint  Patient presents with  . Headache    LVAD     (Consider location/radiation/quality/duration/timing/severity/associated sxs/prior Treatment) HPI Comments: Patient is a 54 yo M PMHx of chronic systolic CHF likely 2/2 chemotherapy now s/p LVAD 10/13/14, NHL s/p chemotherapy, h/o CVA 2008, VT/VF s/p ICD, PSVT, T2DM, and spontaneous ICH on 5/17 presenting to the ED for one week of worsening throbbing headache that starts behind his eyes that wraps around to the back of his head. He has tried Tylenol and Tramadol with no permanent relief. Denies any new numbness or weakness to his extremities from previous stroke. Denies any CP, SOB, vomiting, diarrhea, abdominal pain. Patient denies any history of trauma, new/different activity, rash, infection, bites, history of gout or arthritis.   Patient is a 53 y.o. male presenting with headaches.  Headache Associated symptoms: weakness (chronic R sided)     Past Medical History  Diagnosis Date  . Paroxysmal ventricular tachycardia 2005, 2010    s/p AICD '05, replaced w/ St. Jude's in 2010 (PVT/V.Fib arrest requiring ICD implant in 2005)  . HYPERTENSION, UNSPECIFIED   . HYPERLIPIDEMIA-MIXED   . 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 Nonischemic cardiomyopathy     2/2 Adriamycin administration for lymphoma  . CHF (congestive heart failure)     EF 15% by echo 2009; s/p St. Jude ICD  . Diabetes mellitus     Type 2  . Cancer 1992, 2007     non hodkins lymphoma 1992, Hodgkins 2007  . Depression   . LV (left ventricular) mural thrombus 2009    2009, on coumadin  . Small bowel obstruction 2001    s/p Small bowel resection 2001  . Jejunal intussusception 2008    2008  . 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 Splenic mass 2009    Noted on Abd [place] 2009 w/ recs for f/u CT - not done  . Hypotension   . Noncompliance with medication regimen   . AICD (automatic cardioverter/defibrillator) present 2005, 2010  . Presence of permanent cardiac pacemaker    Past Surgical History  Procedure Laterality Date  . Cardiac defibrillator placement      2005, replaced w/ St. Jude's 2010  . Vasectomy    . Bowel resection      small bowell 2/2 obstruction 2001  . Pacemaker insertion    . Insert / replace / remove pacemaker    . Colonoscopy N/A 09/18/2014    Procedure: COLONOSCOPY;  Surgeon: [name], MD;  Location: [facility] ENDOSCOPY;  Service: Endoscopy;  Laterality: N/A;  . Esophagogastroduodenoscopy N/A 09/18/2014    Procedure: ESOPHAGOGASTRODUODENOSCOPY (EGD);  Surgeon: [name], MD;  Location: [facility] ENDOSCOPY;  Service: Endoscopy;  Laterality: N/A;  . Left and right heart catheterization with coronary angiogram N/A 09/19/2014    Procedure: LEFT AND RIGHT HEART CATHETERIZATION WITH CORONARY ANGIOGRAM;  Surgeon: Jolaine Artist, MD;  Location: [facility] CATH LAB;  Service: Cardiovascular;  Laterality: N/A;  . Colonoscopy N/A 09/19/2014    Procedure: COLONOSCOPY;  Surgeon: [name], MD;  Location: [place];  Service: Gastroenterology;  Laterality: N/A;  . Insertion of implantable left ventricular assist device N/A 10/13/2014  Procedure: INSERTION OF IMPLANTABLE LEFT VENTRICULAR ASSIST DEVICE;  Surgeon: [name], MD;  Location: [place];  Service: Open Heart Surgery;  Laterality: N/A;  CIRC ARREST  NITRIC OXIDE  . Tee without cardioversion N/A 10/13/2014    Procedure: TRANSESOPHAGEAL ECHOCARDIOGRAM (TEE);  Surgeon: [name], MD;  Location: [place];  Service: Open Heart Surgery;  Laterality: N/A;  . Exploration post operative open heart N/A 10/13/2014    Procedure: EXPLORATION POST OPERATIVE OPEN HEART;  Surgeon: [name], MD;  Location: [place] OR;  Service: Open  Heart Surgery;  Laterality: N/A;   Family History  Problem Relation Age of Onset  . Other      No known family h/o heart disease  . Heart disease    . Diabetes Mother   . Other Other     complications from hip replacement   History  Substance Use Topics  . Smoking status: Never Smoker   . Smokeless tobacco: Never Used  . Alcohol Use: 1.2 oz/week    1 Cans of beer, 1 Shots of liquor per week     Comment: ON SPECIAL OCCASIONS    Review of Systems  Neurological: Positive for weakness (chronic R sided) and headaches.  All other systems reviewed and are negative.     Allergies  Review of patient's allergies indicates no known allergies.  Home Medications   Prior to Admission medications   Medication Sig Start Date End Date Taking? Authorizing Provider  acetaminophen (TYLENOL) 650 MG CR tablet Take 1 tablet (650 mg total) by mouth every 8 (eight) hours as needed for pain. 12/19/14  Yes Alexa Sherral Hammers, MD  albuterol (PROVENTIL HFA;VENTOLIN HFA) 108 (90 BASE) MCG/ACT inhaler Inhale 1-2 puffs into the lungs every 6 (six) hours as needed for wheezing or shortness of breath.   Yes Historical Provider, MD  amiodarone (PACERONE) 200 MG tablet Take 1 tablet (200 mg total) by mouth daily. 10/24/14  Yes Amy D Clegg, NP  atorvastatin (LIPITOR) 40 MG tablet Take 0.5 tablets (20 mg total) by mouth at bedtime. 09/04/14  Yes Larey Dresser, MD  bisacodyl (DULCOLAX) 5 MG EC tablet Take 1 tablet (5 mg total) by mouth daily as needed for moderate constipation. 09/04/14  Yes Larey Dresser, MD  hydrALAZINE (APRESOLINE) 100 MG tablet Take 1 tablet (100 mg total) by mouth 3 (three) times daily. 12/22/14  Yes Dayna N Dunn, PA-C  insulin glargine (LANTUS) 100 UNIT/ML injection Inject 0.05 mLs (5 Units total) into the skin at bedtime. 10/24/14  Yes Amy D Clegg, NP  isosorbide mononitrate (IMDUR) 30 MG 24 hr tablet Take 1 tablet (30 mg total) by mouth daily. 10/24/14  Yes Amy D Ninfa Meeker, NP  Magnesium 400 MG  TABS Take 1 tablet by mouth daily with breakfast. 10/30/14  Yes Amy D Clegg, NP  pantoprazole (PROTONIX) 40 MG tablet Take 1 tablet (40 mg total) by mouth daily. 10/24/14  Yes Amy D Clegg, NP  RA ASPIRIN EC 325 MG EC tablet Take 325 mg by mouth daily. 10/24/14  Yes Historical Provider, MD  spironolactone (ALDACTONE) 25 MG tablet Take 25 mg by mouth daily as needed (fluid).   Yes Historical Provider, MD  traMADol (ULTRAM) 50 MG tablet Take 1-2 tablets (50-100 mg total) by mouth every 6 (six) hours as needed for moderate pain. 10/24/14  Yes Amy D Clegg, NP  warfarin (COUMADIN) 5 MG tablet Take 0.5-1 tablets (2.5-5 mg total) by mouth daily. 2.5 mg every day except 5  mg Monday and Wednesday 12/24/14  Yes Amy D Clegg, NP   BP 112/84 mmHg  Pulse 69  Temp(Src) 97.9 F (36.6 C) (Oral)  Resp 18  Ht 5\' 7"  (1.702 m)  Wt 145 lb 11.6 oz (66.1 kg)  BMI 22.82 kg/m2  SpO2 98% Physical Exam  Constitutional: He is oriented to person, place, and time. He appears well-developed and well-nourished. No distress.  HENT:  Head: Normocephalic and atraumatic.  Right Ear: External ear normal.  Left Ear: External ear normal.  Nose: Nose normal.  Mouth/Throat: Oropharynx is clear and moist. No oropharyngeal exudate.  Eyes: Conjunctivae and EOM are normal. Pupils are equal, round, and reactive to light.  Neck: Normal range of motion. Neck supple.  Cardiovascular:  RRR. LVAD in place  Pulmonary/Chest: Effort normal and breath sounds normal. No respiratory distress.  Abdominal: Soft. There is no tenderness.  Neurological: He is alert and oriented to person, place, and time. He has normal strength. Gait normal. GCS eye subscore is 4. GCS verbal subscore is 5. GCS motor subscore is 6.  A&O x3, 4/5 strength in right upper and lower extremities, but 5/5 on left. Decreased sensation on the right as compared to the left over face, right upper and lower extremities. Cranial nerve II-XII are grossly intact. No pronator drift.   Bilateral heel-knee-shin intact.  Skin: Skin is warm and dry. He is not diaphoretic.  Nursing note and vitals reviewed.   ED Course  Procedures (including critical care time) Medications  spironolactone (ALDACTONE) tablet 25 mg (not administered)  acetaminophen (TYLENOL) tablet 650 mg (not administered)  amiodarone (PACERONE) tablet 200 mg (200 mg Oral Given 01/12/15 1215)  insulin glargine (LANTUS) injection 5 Units (not administered)  pantoprazole (PROTONIX) EC tablet 40 mg (40 mg Oral Given 01/12/15 1215)  traMADol (ULTRAM) tablet 50-100 mg (not administered)  atorvastatin (LIPITOR) tablet 20 mg (not administered)  bisacodyl (DULCOLAX) EC tablet 5 mg (not administered)  albuterol (PROVENTIL) (2.5 MG/3ML) 0.083% nebulizer solution 3 mL (not administered)  heparin injection 5,000 Units (5,000 Units Subcutaneous Given 01/12/15 1335)  sodium chloride 0.9 % injection 3 mL (3 mLs Intravenous Given 01/12/15 1216)  sodium chloride 0.9 % injection 3 mL (not administered)  0.9 %  sodium chloride infusion (not administered)  lisinopril (PRINIVIL,ZESTRIL) tablet 5 mg (5 mg Oral Given 01/12/15 1335)  amLODipine (NORVASC) tablet 5 mg (5 mg Oral Given 01/12/15 1216)  morphine 2 MG/ML injection 2 mg (not administered)  oxyCODONE-acetaminophen (PERCOCET/ROXICET) 5-325 MG per tablet 1 tablet (1 tablet Oral Given 01/12/15 1216)  magnesium oxide (MAG-OX) tablet 400 mg (not administered)  Warfarin - Pharmacist Dosing Inpatient (not administered)  warfarin (COUMADIN) tablet 5 mg (not administered)  methocarbamol (ROBAXIN) tablet 500 mg (500 mg Oral Given 01/12/15 1335)  diphenhydrAMINE (BENADRYL) injection 25 mg (25 mg Intramuscular Given 01/12/15 0809)  prochlorperazine (COMPAZINE) injection 10 mg (10 mg Intramuscular Given 01/12/15 0809)  valproate (DEPACON) 500 mg in dextrose 5 % 50 mL IVPB (500 mg Intravenous Given 01/12/15 1336)    And  diphenhydrAMINE (BENADRYL) injection 25 mg (25 mg Intravenous Given 01/12/15 1335)     Labs Review Labs Reviewed  PROTIME-INR - Abnormal; Notable for the following:    Prothrombin Time 16.8 (*)    All other components within normal limits  CBC - Abnormal; Notable for the following:    RBC 3.23 (*)    Hemoglobin 9.2 (*)    HCT 27.9 (*)    All other components within normal limits  BASIC  METABOLIC PANEL - Abnormal; Notable for the following:    Glucose, Bld 110 (*)    All other components within normal limits  LACTATE DEHYDROGENASE - Abnormal; Notable for the following:    LDH 225 (*)    All other components within normal limits  SEDIMENTATION RATE - Abnormal; Notable for the following:    Sed Rate 30 (*)    All other components within normal limits  PROTIME-INR - Abnormal; Notable for the following:    Prothrombin Time 16.8 (*)    All other components within normal limits  LACTATE DEHYDROGENASE - Abnormal; Notable for the following:    LDH 245 (*)    All other components within normal limits  CBC - Abnormal; Notable for the following:    RBC 3.29 (*)    Hemoglobin 9.3 (*)    HCT 28.5 (*)    All other components within normal limits  MRSA PCR SCREENING  CREATININE, SERUM  GLUCOSE, CAPILLARY    Imaging Review Ct Head Wo Contrast  01/12/2015   CLINICAL DATA:  Headache for 1 week, history stroke, LVAD, hypertension, non ischemic cardiomyopathy, CHF, diabetes mellitus, lymphoma in remission  EXAM: CT HEAD WITHOUT CONTRAST  TECHNIQUE: Contiguous axial images were obtained from the base of the skull through the vertex without intravenous contrast.  COMPARISON:  01/28/2015  FINDINGS: Generalized atrophy.  Stable ex vacuo dilatation of the LEFT lateral ventricle.  Large old LEFT basal ganglia infarct extending into external capsule.  Small LEFT thalamus.  Mild small vessel chronic ischemic changes of deep cerebral white matter.  High attenuation hemorrhage and edema seen previously at the RIGHT temporal lobe has resolved, with residual infarct at site.  No recurrent  intracranial hemorrhage, mass lesion or additional acute infarct identified.  No extra-axial fluid collections.  Posterior fossa unremarkable.  Visualized paranasal sinuses and mastoid air cells clear.  No acute osseous findings.  Minimal atherosclerotic calcification at the carotid siphons.  IMPRESSION: Old LEFT basal ganglia and RIGHT temporal lobe infarcts.  Resolution of high attenuation RIGHT temporal lobe hematoma and edema since 12/05/2014.  No new intracranial abnormalities.   Electronically Signed   By: Lavonia [place] M.D.   On: 01/12/2015 08:59     EKG Interpretation None      Dr. Aundra Dubin has seen the patient and will admit for observation.   MDM   Final diagnoses:  Acute intractable headache, unspecified headache type    Filed Vitals:   01/12/15 1500  BP:   Pulse: 69  Temp:   Resp: 18   Afebrile, NAD, non-toxic appearing, AAOx4.   I have reviewed nursing notes, vital signs, and all appropriate lab and imaging results if ordered as above.   No neurofocal deficits on examination. Labs reviewed. CT scan obtained without acute abnormality.  No new CVA or hemorrhage. LVAD team has seen the patient and will admit for pain management of headache. Patient d/w with Dr. Zenia Resides, agrees with plan.     Baron Sane, PA-C 01/12/15 1533  Lacretia Leigh, MD 01/15/15 2056

## 2015-01-12 NOTE — ED Notes (Signed)
Patient transported to CT with this RN 

## 2015-01-12 NOTE — ED Notes (Signed)
Pt c/o of HA over a week. Pt has taken Tramadol and Tylenol w/o relief. Pt has LVAD. Rapid Response notified. Pt has hx of brain bleed within the last month. Pt A&Ox4

## 2015-01-12 NOTE — Progress Notes (Signed)
ANTICOAGULATION CONSULT NOTE - Initial Consult  Pharmacy Consult for Warfarin Indication: LVAD  No Known Allergies  Patient Measurements: Height: 5\' 7"  (170.2 cm) Weight: 145 lb 11.6 oz (66.1 kg) IBW/kg (Calculated) : 66.1  Vital Signs: Temp: 98.1 F (36.7 C) (07/04 0749) Temp Source: Oral (07/04 0749) BP: 118/100 mmHg (07/04 1130) Pulse Rate: 69 (07/04 1130)  Labs:  Recent Labs  01/09/15 1247 01/12/15 0821  HGB  --  9.2*  HCT  --  27.9*  PLT  --  276  LABPROT 13.1 16.8*  INR 1.2* 1.35  CREATININE  --  0.82    Estimated Creatinine Clearance: 99.6 mL/min (by C-G formula based on Cr of 0.82).   Medical History: Past Medical History  Diagnosis Date  . Paroxysmal ventricular tachycardia 2005, 2010    s/p AICD '05, replaced w/ St. Jude's in 2010 (PVT/V.Fib arrest requiring ICD implant in 2005)  . HYPERTENSION, UNSPECIFIED   . HYPERLIPIDEMIA-MIXED   . 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 Nonischemic cardiomyopathy     2/2 Adriamycin administration for lymphoma  . CHF (congestive heart failure)     EF 15% by echo 2009; s/p St. Jude ICD  . Diabetes mellitus     Type 2  . Cancer 1992, 2007     non hodkins lymphoma 1992, Hodgkins 2007  . Depression   . LV (left ventricular) mural thrombus 2009    2009, on coumadin  . Small bowel obstruction 2001    s/p Small bowel resection 2001  . Jejunal intussusception 2008    2008  . 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 Splenic mass 2009    Noted on Abd [place] 2009 w/ recs for f/u CT - not done  . Hypotension   . Noncompliance with medication regimen   . AICD (automatic cardioverter/defibrillator) present 2005, 2010  . Presence of permanent cardiac pacemaker     Medications:  Prescriptions prior to admission  Medication Sig Dispense Refill Last Dose  . acetaminophen  (TYLENOL) 650 MG CR tablet Take 1 tablet (650 mg total) by mouth every 8 (eight) hours as needed for pain. 14 tablet 0 unkn  . albuterol (PROVENTIL HFA;VENTOLIN HFA) 108 (90 BASE) MCG/ACT inhaler Inhale 1-2 puffs into the lungs every 6 (six) hours as needed for wheezing or shortness of breath.   unkn  . amiodarone (PACERONE) 200 MG tablet Take 1 tablet (200 mg total) by mouth daily. 30 tablet 6 01/11/2015 at Unknown time  . atorvastatin (LIPITOR) 40 MG tablet Take 0.5 tablets (20 mg total) by mouth at bedtime. 30 tablet 6 01/11/2015 at Unknown time  . bisacodyl (DULCOLAX) 5 MG EC tablet Take 1 tablet (5 mg total) by mouth daily as needed for moderate constipation. 30 tablet 0 unkn  . hydrALAZINE (APRESOLINE) 100 MG tablet Take 1 tablet (100 mg total) by mouth 3 (three) times daily. 270 tablet 1 01/11/2015 at Unknown time  . insulin glargine (LANTUS) 100 UNIT/ML injection Inject 0.05 mLs (5 Units total) into the skin at bedtime. 10 mL 11 01/11/2015 at Unknown time  . isosorbide mononitrate (IMDUR) 30 MG 24 hr tablet Take 1 tablet (30 mg total) by mouth daily. 30 tablet 6 01/11/2015 at Unknown time  . Magnesium 400 MG TABS Take 1 tablet by mouth daily with breakfast. 90 tablet 3 01/11/2015 at Unknown  time  . pantoprazole (PROTONIX) 40 MG tablet Take 1 tablet (40 mg total) by mouth daily. 30 tablet 6 01/11/2015 at Unknown time  . RA ASPIRIN EC 325 MG EC tablet Take 325 mg by mouth daily.  0 01/11/2015 at Unknown time  . spironolactone (ALDACTONE) 25 MG tablet Take 25 mg by mouth daily as needed (fluid).   unkn  . traMADol (ULTRAM) 50 MG tablet Take 1-2 tablets (50-100 mg total) by mouth every 6 (six) hours as needed for moderate pain. 30 tablet 0 01/12/2015 at Unknown time  . warfarin (COUMADIN) 5 MG tablet Take 0.5-1 tablets (2.5-5 mg total) by mouth daily. 2.5 mg every day except 5 mg Monday and Wednesday 45 tablet 3 01/11/2015 at Unknown time    Assessment: 54 yo M admitted 01/12/2015 with intractable headache. Patient  with LVAD (placed 10/13/14) on warfarin and spontaneous ICH last month.  Pharmacy consulted to dose warfarin.  Coag: LVAD INR goal 1.7-2.2 , 7/4 CT head w/o contrast done in ER: 2 prior ischemic CVAs noted, intracerebral hemorrhage from 5/16 resolved, no new findings.   INR < goal on admission, INR has been labile during June on 1 to 3 doses of 5 mg per week with 2.5 mg daily dose ( last dose PTA 7/3).   No plan to bridge with heparin/lovenox  PTA 2.5 mg daily except 5 mg Mon & Wed  Goal of Therapy:  INR 1.7-2.2  Plan:  Warfarin 5 mg today Daily INR  Thank you for allowing pharmacy to be a part of this patients care team.  Rowe Robert Pharm.D., BCPS, AQ-Cardiology Clinical Pharmacist 01/12/2015 12:21 PM Pager: [phone] Phone: [phone]

## 2015-01-12 NOTE — H&P (Signed)
VAD TEAM History & Physical Note   Reason for Admission: Intractable headache   HPI:    [name] is a 54 y.o. 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 Underwent HMII LVAD placement 10/13/2014   Admitted April 1st 2016 for schedule HMII implant. Post implant he was taken back to the OR for bleeding otherwise his post operative course was uneventful. LVAD speed turned down to 9000 on the day of discharge. Discharge weight was 160 pounds.   Admitted 5/16 with headache and CT showed spontaneous intracerebral bleed with INR 3.1. Given vit k 5 and 2 u FFP. Repeat head CT the next am: showed Enlarging RIGHT posterior temporal lobe hematomas 3.2 cm, previously 2.6 cm with similar local mass effect. Seen by Dr. Arnoldo Morale in [place] and treated conservatively. Remained stable from a neuro perspective. Repeat CT prior to d/c was stable. ASA stopped and Warfarin restarted, aiming for INR 1.7-.2.2.  PI events are still extremely frequent (up to 40/day). Not on diuretic, LDH stable and has had no other signs/symptoms of hemolysis or HF symptoms. Had planned for ramp echo next clinic visit.  LV is small so may be positional obstructions augmented by inadequate hydration.   Patient has been having a frontal headache x 1 week almost constantly.  He says it started when he increased hydralazine from 50 mg tid to 100 mg tid (had been on Imdur prior to headache, does not seem to be related to nitrate use).  No nausea/vomiting. He has photophobia but no stiff neck, fever, or elevated white count.  Tramadol and Tylenol not helping.  "10/10" pain this morning so came in to ER.  Currently lying in  the dark, still with significant pain. He has not taken hydralazine today.  Other than headache, he has been doing well.  No dyspnea, orthopnea, chest pain.   CT head w/o contrast done in ER: 2 prior ischemic CVAs noted, intracerebral hemorrhage from 5/16 resolved, no new findings.    LVAD INTERROGATION:  HeartMate II LVAD:  Flow 4.3 liters/min, speed 9000, power 4.9, PI 7.6.  About 40 PI events in the last 24 hrs (similar to the past).   Review of Systems: All systems reviewed and negative except as per HPI.   Home Medications Prior to Admission medications   Medication Sig Start Date End Date Taking? Authorizing Provider  acetaminophen (TYLENOL) 650 MG CR tablet Take 1 tablet (650 mg total) by mouth every 8 (eight) hours as needed for pain. 12/19/14  Yes Alexa Sherral Hammers, MD  albuterol (PROVENTIL HFA;VENTOLIN HFA) 108 (90 BASE) MCG/ACT inhaler Inhale 1-2 puffs into the lungs every 6 (six) hours as needed for wheezing or shortness of breath.   Yes Historical Provider, MD  amiodarone (PACERONE) 200 MG tablet Take 1 tablet (200 mg total) by mouth daily. 10/24/14  Yes Amy D Clegg, NP  atorvastatin (LIPITOR) 40 MG tablet Take 0.5 tablets (20 mg total) by mouth at bedtime. 09/04/14  Yes Larey Dresser, MD  bisacodyl (DULCOLAX) 5 MG EC tablet Take 1 tablet (5 mg total) by mouth daily as needed for moderate constipation. 09/04/14  Yes Larey Dresser, MD  hydrALAZINE (  APRESOLINE) 100 MG tablet Take 1 tablet (100 mg total) by mouth 3 (three) times daily. 12/22/14  Yes Dayna N Dunn, PA-C  insulin glargine (LANTUS) 100 UNIT/ML injection Inject 0.05 mLs (5 Units total) into the skin at bedtime. 10/24/14  Yes Amy D Clegg, NP  isosorbide mononitrate (IMDUR) 30 MG 24 hr tablet Take 1 tablet (30 mg total) by mouth daily. 10/24/14  Yes Amy D Ninfa Meeker, NP  Magnesium 400 MG TABS Take 1 tablet by mouth daily with breakfast. 10/30/14  Yes Amy D Clegg, NP  pantoprazole (PROTONIX) 40 MG tablet Take 1 tablet (40 mg total)  by mouth daily. 10/24/14  Yes Amy D Clegg, NP  RA ASPIRIN EC 325 MG EC tablet Take 325 mg by mouth daily. 10/24/14  Yes Historical Provider, MD  spironolactone (ALDACTONE) 25 MG tablet Take 25 mg by mouth daily as needed (fluid).   Yes Historical Provider, MD  traMADol (ULTRAM) 50 MG tablet Take 1-2 tablets (50-100 mg total) by mouth every 6 (six) hours as needed for moderate pain. 10/24/14  Yes Amy D Clegg, NP  warfarin (COUMADIN) 5 MG tablet Take 0.5-1 tablets (2.5-5 mg total) by mouth daily. 2.5 mg every day except 5 mg Monday and Wednesday 12/24/14  Yes Amy Estrella Deeds, NP    Past Medical History: Past Medical History  Diagnosis Date  . Paroxysmal ventricular tachycardia 2005, 2010    s/p AICD '05, replaced w/ St. Jude's in 2010 (PVT/V.Fib arrest requiring ICD implant in 2005)  . HYPERTENSION, UNSPECIFIED   . HYPERLIPIDEMIA-MIXED   . 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 Nonischemic cardiomyopathy     2/2 Adriamycin administration for lymphoma  . CHF (congestive heart failure)     EF 15% by echo 2009; s/p St. Jude ICD  . Diabetes mellitus     Type 2  . Cancer 1992, 2007     non hodkins lymphoma 1992, Hodgkins 2007  . Depression   . LV (left ventricular) mural thrombus 2009    2009, on coumadin  . Small bowel obstruction 2001    s/p Small bowel resection 2001  . Jejunal intussusception 2008    2008  . 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 Splenic mass 2009    Noted on Abd [place] 2009 w/ recs for f/u CT - not done  . Hypotension   . Noncompliance with medication regimen   . AICD (automatic cardioverter/defibrillator) present 2005, 2010  . Presence of permanent cardiac pacemaker     Past Surgical History: Past Surgical History  Procedure Laterality Date  . Cardiac defibrillator placement      2005, replaced w/ St. Jude's 2010  .  Vasectomy    . Bowel resection      small bowell 2/2 obstruction 2001  . Pacemaker insertion    . Insert / replace / remove pacemaker    . Colonoscopy N/A 09/18/2014    Procedure: COLONOSCOPY;  Surgeon: [name], MD;  Location: [facility] ENDOSCOPY;  Service: Endoscopy;  Laterality: N/A;  . Esophagogastroduodenoscopy N/A 09/18/2014    Procedure: ESOPHAGOGASTRODUODENOSCOPY (EGD);  Surgeon: [name], MD;  Location: [facility] ENDOSCOPY;  Service: Endoscopy;  Laterality: N/A;  . Left and right heart catheterization with coronary angiogram N/A 09/19/2014    Procedure: LEFT AND RIGHT HEART CATHETERIZATION WITH CORONARY ANGIOGRAM;  Surgeon: Shaune Pascal  Bensimhon, MD;  Location: [place] CATH LAB;  Service: Cardiovascular;  Laterality: N/A;  . Colonoscopy N/A 09/19/2014    Procedure: COLONOSCOPY;  Surgeon: [name], MD;  Location: [place];  Service: Gastroenterology;  Laterality: N/A;  . Insertion of implantable left ventricular assist device N/A 10/13/2014    Procedure: INSERTION OF IMPLANTABLE LEFT VENTRICULAR ASSIST DEVICE;  Surgeon: [name], MD;  Location: [place];  Service: Open Heart Surgery;  Laterality: N/A;  CIRC ARREST  NITRIC OXIDE  . Tee without cardioversion N/A 10/13/2014    Procedure: TRANSESOPHAGEAL ECHOCARDIOGRAM (TEE);  Surgeon: [name], MD;  Location: [place];  Service: Open Heart Surgery;  Laterality: N/A;  . Exploration post operative open heart N/A 10/13/2014    Procedure: EXPLORATION POST OPERATIVE OPEN HEART;  Surgeon: [name], MD;  Location: [facility] OR;  Service: Open Heart Surgery;  Laterality: N/A;    Family History: Family History  Problem Relation Age of Onset  . Other      No known family h/o heart disease  . Heart disease    . Diabetes Mother   . Other Other     complications from hip replacement    Social History: History   Social History  . Marital Status: Married    Spouse Name: N/A  . Number of Children: N/A  . Years of Education: N/A   Social History  Main Topics  . Smoking status: Never Smoker   . Smokeless tobacco: Never Used  . Alcohol Use: 1.2 oz/week    1 Cans of beer, 1 Shots of liquor per week     Comment: ON SPECIAL OCCASIONS  . Drug Use: No  . Sexual Activity: No   Other Topics Concern  . None   Social History Narrative    Allergies:  No Known Allergies  Objective:    Vital Signs:   Temp:  [98.1 F (36.7 C)] 98.1 F (36.7 C) (07/04 0749) Pulse Rate:  [65-71] 65 (07/04 0800) Resp:  [14-16] 16 (07/04 0800) BP: (100)/(0) 100/0 mmHg (07/04 0749) SpO2:  [99 %] 99 % (07/04 0800)   There were no vitals filed for this visit.  Mean arterial Pressure 100  Physical Exam: General:  Well appearing. No resp difficulty HEENT: normal Neck: supple. JVP not elevated. Carotids 2+ bilat; no bruits. No lymphadenopathy or thryomegaly appreciated. Cor: Mechanical heart sounds with LVAD hum present. Lungs: clear Abdomen: soft, nontender, nondistended. No hepatosplenomegaly. No bruits or masses. Good bowel sounds. Driveline: C/D/I; securement device intact and driveline incorporated Extremities: no cyanosis, clubbing, rash, edema Neuro: alert & orientedx3, cranial nerves grossly intact. moves all 4 extremities w/o difficulty. Affect pleasant  Telemetry: NSR  Labs: Basic Metabolic Panel:  Recent Labs Lab 01/12/15 0821  NA 136  K 3.6  CL 103  CO2 25  GLUCOSE 110*  BUN 10  CREATININE 0.82  CALCIUM 9.0    Liver Function Tests: No results for input(s): AST, ALT, ALKPHOS, BILITOT, PROT, ALBUMIN in the last 168 hours. No results for input(s): LIPASE, AMYLASE in the last 168 hours. No results for input(s): AMMONIA in the last 168 hours.  CBC:  Recent Labs Lab 01/12/15 0821  WBC 7.2  HGB 9.2*  HCT 27.9*  MCV 86.4  PLT 276    Cardiac Enzymes: No results for input(s): CKTOTAL, CKMB, CKMBINDEX, TROPONINI in the last 168 hours.  BNP: BNP (last 3 results)  Recent Labs  10/20/14 0407 11/05/14 1030  12/19/14 1115  BNP 535.3* 118.8* 82.3  ProBNP (last 3 results)  Recent Labs  06/12/14 1415 06/18/14 1200  PROBNP 2079.0* 2144.0*     CBG: No results for input(s): GLUCAP in the last 168 hours.  Coagulation Studies:  Recent Labs  01/09/15 1247 01/12/15 0821  LABPROT 13.1 16.8*  INR 1.2* 1.35    Other results: EKG: Ordered, not yet done  Imaging: Ct Head Wo Contrast  01/12/2015   CLINICAL DATA:  Headache for 1 week, history stroke, LVAD, hypertension, non ischemic cardiomyopathy, CHF, diabetes mellitus, lymphoma in remission  EXAM: CT HEAD WITHOUT CONTRAST  TECHNIQUE: Contiguous axial images were obtained from the base of the skull through the vertex without intravenous contrast.  COMPARISON:  01/28/2015  FINDINGS: Generalized atrophy.  Stable ex vacuo dilatation of the LEFT lateral ventricle.  Large old LEFT basal ganglia infarct extending into external capsule.  Small LEFT thalamus.  Mild small vessel chronic ischemic changes of deep cerebral white matter.  High attenuation hemorrhage and edema seen previously at the RIGHT temporal lobe has resolved, with residual infarct at site.  No recurrent intracranial hemorrhage, mass lesion or additional acute infarct identified.  No extra-axial fluid collections.  Posterior fossa unremarkable.  Visualized paranasal sinuses and mastoid air cells clear.  No acute osseous findings.  Minimal atherosclerotic calcification at the carotid siphons.  IMPRESSION: Old LEFT basal ganglia and RIGHT temporal lobe infarcts.  Resolution of high attenuation RIGHT temporal lobe hematoma and edema since 12/05/2014.  No new intracranial abnormalities.   Electronically Signed   By: Lavonia Dana M.D.   On: 01/12/2015 08:59         Assessment/Plan:   1. Headache: Severe, intractable frontal headache.  Constant and gradually worse x 1 week.  CT head did not show recurrent intracerebral hemorrhage.  INR only 1.3 today.  No focal neurological deficits noted  on exam.  Given intractability of headache, will bring in for overnight observation.  ?Related to hydralazine increased.  Does not seem to be related to Imdur use as he has been on this for a while without problems.  - I will stop both hydralazine and Imdur and use amlodipine for BP control. - I will have him evaluated by neurology.  - Pain control.  2. Chronic systolic HF: Status post LVAD implantation for DT. NYHA class I-II.  Not volume overloaded.  Has multiple PI events which are sporadic. He is not on a diuretic.  Planned for ramp echo next clinic visit.   3. Anticoagulation management: INR goal 1.7-2.2 with recent intracerebral hemorrhage. Off ASA. No recurrent hemorrhage on CT today.  Will adjust coumadin given subtherapeutic INR.  With recent hemorrhage, will not bridge with Lovenox or heparin. Check LDH.  4. H/o Non Hodgkins Lymphoma s/p therapy 5. H/O CVA: Noted on CT.  6. H/O VT/VF: St Jude ICD 2010.  Continue amiodarone.  7.DMII 8.  PSVT: Quiescent on amiodarone 200 mg daily 9.  Recent ICH 5/16.  10. HTN: Needs better control, will not use hydralazine/nitrates for now.  Will add amlodipine 5 mg daily and think we can use lisinopril 5 mg daily as well.   I reviewed the LVAD parameters from today, and compared the results to the patient's prior recorded data.  No programming changes were made.  The LVAD is functioning within specified parameters.  The patient performs LVAD self-test daily.  LVAD interrogation was negative for any significant power changes, alarms or PI events/speed drops.  LVAD equipment check completed and is in good working order.  Back-up equipment  present.   LVAD education done on emergency procedures and precautions and reviewed exit site care.  Loralie Champagne 01/12/2015, 9:49 AM  VAD Team Pager [phone] (7am - 7am) +++VAD ISSUES ONLY+++   Advanced Heart Failure Team Pager [phone] (M-F; [place])  Please contact [facility] for night-coverage after  hours (4p -7a ) and weekends on [email or web address] for all non- LVAD Issues

## 2015-01-12 NOTE — Telephone Encounter (Signed)
Page received from [place] re: Andre Tran's headache. Has gotten worse since we last spoke on Friday and is severe, unrelieved by 2 Ultram and constant. She informed me that she will be bringing him to the ED for evaluation since it is similar to when he had his spontaneous [place] month. Denies any other neurologic symptoms. Friday his INR was 1.2--we did not treat with Lovenox d/t his history and only bolused with Coumadin 5 mg x 2 days. Previous INR was 3.5.   Dr. Aundra Dubin updated regarding patient status and arrival to ED. ED Charge RN informed about arrival via private vehicle and desire to obtain head CT to r/o repeat ICH in the setting of chronic anticoagulation vs. Medication side effect; Rapid Response RN called to request assist with LVAD equipment in ED.

## 2015-01-12 NOTE — Progress Notes (Signed)
Utilization Review Completed.Sherronda Sweigert T7/10/2014  

## 2015-01-12 NOTE — Consult Note (Signed)
NEURO HOSPITALIST CONSULT NOTE   Referring physician: Mclean  Reason for Consult: intractable HA  HPI:                                                                                                                                          Andre Tran is an 54 y.o. male with significant cardiac history including LVAD and Pacer. Patient has been having a frontal headache x 1 week almost constantly. He says it started when he increased hydralazine from 50 mg tid to 100 mg tid (had been on Imdur prior to headache, does not seem to be related to nitrate use). No nausea/vomiting. He has photophobia but no stiff neck, fever, elevated white count Ha remains in the bilateral frontal region, described as pressure and at times with migrate to the occipital region.  HE denies any visual complaints, weakness, tingling.He does endorse photophobia and phonophobia.  HE rates the HA as 6/10 and not positional.  Tramadol and Tylenol not helping and ED medications of Compazine, Benadryl also of no help.  CT head obtained showing no intracranial etiology.  MRI not able to be obtained.   Hydralazine currently being held by primary team.   Past Medical History  Diagnosis Date  . Paroxysmal ventricular tachycardia 2005, 2010    s/p AICD '05, replaced w/ St. Jude's in 2010 (PVT/V.Fib arrest requiring ICD implant in 2005)  . HYPERTENSION, UNSPECIFIED   . HYPERLIPIDEMIA-MIXED   . 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 Nonischemic cardiomyopathy     2/2 Adriamycin administration for lymphoma  . CHF (congestive heart failure)     EF 15% by echo 2009; s/p St. Jude ICD  . Diabetes mellitus     Type 2  . Cancer 1992, 2007     non hodkins lymphoma 1992, Hodgkins 2007  . Depression   . LV (left ventricular) mural thrombus 2009    2009, on coumadin  . Small bowel obstruction 2001   s/p Small bowel resection 2001  . Jejunal intussusception 2008    2008  . 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 Splenic mass 2009    Noted on Abd [place] 2009 w/ recs for f/u CT - not done  . Hypotension   . Noncompliance with medication regimen   . AICD (automatic cardioverter/defibrillator) present 2005, 2010  . Presence of permanent cardiac pacemaker     Past Surgical History  Procedure Laterality Date  . Cardiac defibrillator placement      2005, replaced w/ St. Jude's 2010  . Vasectomy    . Bowel resection      small  bowell 2/2 obstruction 2001  . Pacemaker insertion    . Insert / replace / remove pacemaker    . Colonoscopy N/A 09/18/2014    Procedure: COLONOSCOPY;  Surgeon: [name], MD;  Location: [facility] ENDOSCOPY;  Service: Endoscopy;  Laterality: N/A;  . Esophagogastroduodenoscopy N/A 09/18/2014    Procedure: ESOPHAGOGASTRODUODENOSCOPY (EGD);  Surgeon: [name], MD;  Location: [facility] ENDOSCOPY;  Service: Endoscopy;  Laterality: N/A;  . Left and right heart catheterization with coronary angiogram N/A 09/19/2014    Procedure: LEFT AND RIGHT HEART CATHETERIZATION WITH CORONARY ANGIOGRAM;  Surgeon: Jolaine Artist, MD;  Location: [facility] CATH LAB;  Service: Cardiovascular;  Laterality: N/A;  . Colonoscopy N/A 09/19/2014    Procedure: COLONOSCOPY;  Surgeon: [name], MD;  Location: [place];  Service: Gastroenterology;  Laterality: N/A;  . Insertion of implantable left ventricular assist device N/A 10/13/2014    Procedure: INSERTION OF IMPLANTABLE LEFT VENTRICULAR ASSIST DEVICE;  Surgeon: [name], MD;  Location: [place];  Service: Open Heart Surgery;  Laterality: N/A;  CIRC ARREST  NITRIC OXIDE  . Tee without cardioversion N/A 10/13/2014    Procedure: TRANSESOPHAGEAL ECHOCARDIOGRAM (TEE);  Surgeon: [name], MD;  Location: [place];  Service: Open Heart Surgery;  Laterality: N/A;  . Exploration post operative open heart N/A 10/13/2014     Procedure: EXPLORATION POST OPERATIVE OPEN HEART;  Surgeon: [name], MD;  Location: [place] OR;  Service: Open Heart Surgery;  Laterality: N/A;    Family History  Problem Relation Age of Onset  . Other      No known family h/o heart disease  . Heart disease    . Diabetes Mother   . Other Other     complications from hip replacement     Social History:  reports that he has never smoked. He has never used smokeless tobacco. He reports that he drinks about 1.2 oz of alcohol per week. He reports that he does not use illicit drugs.  No Known Allergies  MEDICATIONS:                                                                                                                     Scheduled: . amiodarone  200 mg Oral Daily  . amLODipine  5 mg Oral Daily  . atorvastatin  20 mg Oral QHS  . valproate sodium  500 mg Intravenous Once   And  . diphenhydrAMINE  25 mg Intravenous Once  . heparin  5,000 Units Subcutaneous 3 times per day  . insulin glargine  5 Units Subcutaneous QHS  . lisinopril  5 mg Oral Daily  . [START ON 01/13/2015] magnesium oxide  400 mg Oral Q breakfast  . methocarbamol  500 mg Oral 4 times per day  . pantoprazole  40 mg Oral Daily  . sodium chloride  3 mL Intravenous Q12H  . warfarin  5 mg Oral ONCE-1800  . Warfarin - Pharmacist Dosing Inpatient   Does not apply [phone]  ROS:                                                                                                                                       History obtained from the patient  General ROS: negative for - chills, fatigue, fever, night sweats, weight gain or weight loss Psychological ROS: negative for - behavioral disorder, hallucinations, memory difficulties, mood swings or suicidal ideation Ophthalmic ROS: negative for - blurry vision, double vision, eye pain or loss of vision ENT ROS: negative for - epistaxis, nasal discharge, oral lesions, sore throat, tinnitus or vertigo Allergy and Immunology  ROS: negative for - hives or itchy/watery eyes Hematological and Lymphatic ROS: negative for - bleeding problems, bruising or swollen lymph nodes Endocrine ROS: negative for - galactorrhea, hair pattern changes, polydipsia/polyuria or temperature intolerance Respiratory ROS: negative for - cough, hemoptysis, shortness of breath or wheezing Cardiovascular ROS: negative for - chest pain, dyspnea on exertion, edema or irregular heartbeat Gastrointestinal ROS: negative for - abdominal pain, diarrhea, hematemesis, nausea/vomiting or stool incontinence Genito-Urinary ROS: negative for - dysuria, hematuria, incontinence or urinary frequency/urgency Musculoskeletal ROS: negative for - joint swelling or muscular weakness Neurological ROS: as noted in HPI Dermatological ROS: negative for rash and skin lesion changes   Blood pressure 115/86, pulse 66, temperature 97.9 F (36.6 C), temperature source Oral, resp. rate 18, height 5\' 7"  (1.702 m), weight 66.1 kg (145 lb 11.6 oz), SpO2 99 %.   Neurologic Examination:                                                                                                      HEENT-  Normocephalic, no lesions, without obvious abnormality.  Normal external eye and conjunctiva.  Normal TM's bilaterally.  Normal auditory canals and external ears. Normal external nose, mucus membranes and septum.  Normal pharynx. Cardiovascular- normal apical impulse, pulses palpable throughout   Lungs- no tachypnea, retractions or cyanosis Abdomen- normal findings: bowel sounds normal Extremities- no edema Lymph-no adenopathy palpable Musculoskeletal-no joint tenderness, deformity or swelling Skin-warm and dry, no hyperpigmentation, vitiligo, or suspicious lesions  Neurological Examination Mental Status: Alert, oriented, thought content appropriate.  Speech fluent without evidence of aphasia.  Able to follow 3 step commands without difficulty. Cranial Nerves: II: Discs flat  bilaterally; Visual fields grossly normal, pupils equal, round, reactive to light and accommodation III,IV, VI: ptosis not present, extra-ocular motions intact bilaterally V,VII: smile symmetric, facial light touch sensation normal bilaterally VIII: hearing normal bilaterally IX,X: uvula rises  symmetrically XI: bilateral shoulder shrug XII: midline tongue extension Motor: Right : Upper extremity   5/5    Left:     Upper extremity   5/5  Lower extremity   5/5     Lower extremity   5/5 Tone and bulk:normal tone throughout; no atrophy noted Sensory: Pinprick and light touch intact throughout, bilaterally Deep Tendon Reflexes: 2+ and symmetric throughout Plantars: Right: downgoing   Left: downgoing Cerebellar: normal finger-to-nose,  and normal heel-to-shin test Gait: not tested for safety.       Lab Results: Basic Metabolic Panel:  Recent Labs Lab 01/12/15 0821  NA 136  K 3.6  CL 103  CO2 25  GLUCOSE 110*  BUN 10  CREATININE 0.82  CALCIUM 9.0    Liver Function Tests: No results for input(s): AST, ALT, ALKPHOS, BILITOT, PROT, ALBUMIN in the last 168 hours. No results for input(s): LIPASE, AMYLASE in the last 168 hours. No results for input(s): AMMONIA in the last 168 hours.  CBC:  Recent Labs Lab 01/12/15 0821  WBC 7.2  HGB 9.2*  HCT 27.9*  MCV 86.4  PLT 276    Cardiac Enzymes: No results for input(s): CKTOTAL, CKMB, CKMBINDEX, TROPONINI in the last 168 hours.  Lipid Panel: No results for input(s): CHOL, TRIG, HDL, CHOLHDL, VLDL, LDLCALC in the last 168 hours.  CBG: No results for input(s): GLUCAP in the last 168 hours.  Microbiology: Results for orders placed or performed during the hospital encounter of 11/24/14  MRSA PCR Screening     Status: None   Collection Time: 11/24/14  9:32 PM  Result Value Ref Range Status   MRSA by PCR NEGATIVE NEGATIVE Final    Comment:        The GeneXpert MRSA Assay (FDA approved for NASAL specimens only), is one  component of a comprehensive MRSA colonization surveillance program. It is not intended to diagnose MRSA infection nor to guide or monitor treatment for MRSA infections.     Coagulation Studies:  Recent Labs  01/09/15 1247 01/12/15 0821  LABPROT 13.1 16.8*  INR 1.2* 1.35    Imaging: Ct Head Wo Contrast  01/12/2015   CLINICAL DATA:  Headache for 1 week, history stroke, LVAD, hypertension, non ischemic cardiomyopathy, CHF, diabetes mellitus, lymphoma in remission  EXAM: CT HEAD WITHOUT CONTRAST  TECHNIQUE: Contiguous axial images were obtained from the base of the skull through the vertex without intravenous contrast.  COMPARISON:  01/28/2015  FINDINGS: Generalized atrophy.  Stable ex vacuo dilatation of the LEFT lateral ventricle.  Large old LEFT basal ganglia infarct extending into external capsule.  Small LEFT thalamus.  Mild small vessel chronic ischemic changes of deep cerebral white matter.  High attenuation hemorrhage and edema seen previously at the RIGHT temporal lobe has resolved, with residual infarct at site.  No recurrent intracranial hemorrhage, mass lesion or additional acute infarct identified.  No extra-axial fluid collections.  Posterior fossa unremarkable.  Visualized paranasal sinuses and mastoid air cells clear.  No acute osseous findings.  Minimal atherosclerotic calcification at the carotid siphons.  IMPRESSION: Old LEFT basal ganglia and RIGHT temporal lobe infarcts.  Resolution of high attenuation RIGHT temporal lobe hematoma and edema since 12/05/2014.  No new intracranial abnormalities.   Electronically Signed   By: Lavonia Dana M.D.   On: 01/12/2015 08:59    Etta Quill PA-C [facility] [phone]  01/12/2015, 12:44 PM   Assessment/Plan: 54 YO male with HA for week.  Over the night HA was more persistent  requiring him to be seen in ED.  CT head is negative for acute ICH.  Patient currently has a 6/10, constant HA in the frontal region with no other  symptoms.  Exam is non-focal. MRI not obtainable due to LVAD. Etiology unclear.   Recommend: 1) Robaxin 500 mg Q6 hours 2) Depakote 500 mg IV now linked with Benadryl 25 mg, once.  3) Sedimentation rate   We will continue to follow this patient with you  I personally participated in this patient's evaluation and management, including formulating the above clinical impression and management recommendations.  Rush Farmer M.D. [facility] [phone]

## 2015-01-12 NOTE — ED Notes (Signed)
Cardiology at bedside.

## 2015-01-13 ENCOUNTER — Other Ambulatory Visit: Payer: Medicare Other

## 2015-01-13 ENCOUNTER — Other Ambulatory Visit (HOSPITAL_COMMUNITY): Payer: Self-pay | Admitting: *Deleted

## 2015-01-13 DIAGNOSIS — R51 Headache: Secondary | ICD-10-CM | POA: Diagnosis not present

## 2015-01-13 DIAGNOSIS — I5022 Chronic systolic (congestive) heart failure: Secondary | ICD-10-CM | POA: Diagnosis not present

## 2015-01-13 DIAGNOSIS — Z95811 Presence of heart assist device: Secondary | ICD-10-CM

## 2015-01-13 DIAGNOSIS — Z7901 Long term (current) use of anticoagulants: Secondary | ICD-10-CM

## 2015-01-13 DIAGNOSIS — I10 Essential (primary) hypertension: Secondary | ICD-10-CM | POA: Diagnosis not present

## 2015-01-13 LAB — BASIC METABOLIC PANEL
ANION GAP: 9 (ref 5–15)
BUN: 9 mg/dL (ref 6–20)
CO2: 26 mmol/L (ref 22–32)
CREATININE: 0.77 mg/dL (ref 0.61–1.24)
Calcium: 8.7 mg/dL — ABNORMAL LOW (ref 8.9–10.3)
Chloride: 100 mmol/L — ABNORMAL LOW (ref 101–111)
GFR calc Af Amer: >60 mL/min (ref >60)
GLUCOSE: 125 mg/dL — AB (ref 65–99)
Potassium: 3.6 mmol/L (ref 3.5–5.1)
SODIUM: 135 mmol/L (ref 135–145)

## 2015-01-13 LAB — PROTIME-INR
INR: 1.41 (ref 0.00–1.49)
Prothrombin Time: 17.3 seconds — ABNORMAL HIGH (ref 11.6–15.2)

## 2015-01-13 LAB — GLUCOSE, CAPILLARY
Glucose-Capillary: 131 mg/dL — ABNORMAL HIGH (ref 65–99)
Glucose-Capillary: 143 mg/dL — ABNORMAL HIGH (ref 65–99)

## 2015-01-13 LAB — LACTATE DEHYDROGENASE: LDH: 240 U/L — ABNORMAL HIGH (ref 98–192)

## 2015-01-13 MED ORDER — AMLODIPINE BESYLATE 5 MG PO TABS: 5.0000 mg | ORAL_TABLET | Freq: Every day | ORAL | Status: DC

## 2015-01-13 MED ORDER — WARFARIN SODIUM 5 MG PO TABS: 2.5000 mg | ORAL_TABLET | Freq: Every day | ORAL | Status: DC

## 2015-01-13 MED ORDER — METHOCARBAMOL 500 MG PO TABS: 500.0000 mg | ORAL_TABLET | Freq: Four times a day (QID) | ORAL | Status: DC

## 2015-01-13 MED ORDER — LISINOPRIL 5 MG PO TABS: 5.0000 mg | ORAL_TABLET | Freq: Every day | ORAL | Status: DC

## 2015-01-13 NOTE — Progress Notes (Addendum)
HeartMate 2 Rounding Note  Subjective:    Admitted with headache. Evaluated by Neuro. No evidence of ICH.   CT head w/o contrast done in ER: 2 prior ischemic CVAs noted, intracerebral hemorrhage from 5/16 resolved, no new findings.   Headache resolved. Poor appetite.   LDH 240  INR 1.41   LVAD INTERROGATION:  HeartMate II LVAD:  Flow 3.5 liters/min, speed 9000, power 4.5, PI 7.1 Rare PI events.   Objective:    Vital Signs:   Temp:  [97.9 F (36.6 C)-98.6 F (37 C)] 98.6 F (37 C) (07/05 0359) Pulse Rate:  [61-83] 61 (07/05 0600) Resp:  [13-20] 15 (07/05 0600) BP: (90-118)/(0-100) 107/80 mmHg (07/05 0600) SpO2:  [93 %-100 %] 97 % (07/05 0600) Weight:  [145 lb 4.5 oz (65.9 kg)-145 lb 11.6 oz (66.1 kg)] 145 lb 4.5 oz (65.9 kg) (07/05 0442)   Mean arterial Pressure   Intake/Output:   Intake/Output Summary (Last 24 hours) at 01/13/15 0710 Last data filed at 01/13/15 0400  Gross per 24 hour  Intake    218 ml  Output    200 ml  Net     18 ml   Physical Exam: General: Well appearing. No resp difficulty HEENT: normal Neck: supple. JVP not elevated. Carotids 2+ bilat; no bruits. No lymphadenopathy or thryomegaly appreciated. Cor: Mechanical heart sounds with LVAD hum present. Lungs: clear Abdomen: soft, nontender, nondistended. No hepatosplenomegaly. No bruits or masses. Good bowel sounds. Driveline: C/D/I; securement device intact and driveline incorporated Extremities: no cyanosis, clubbing, rash, edema Neuro: alert & orientedx3, cranial nerves grossly intact. moves all 4 extremities w/o difficulty. Affect pleasant   Telemetry: NSR 60s Labs: Basic Metabolic Panel:  Recent Labs Lab 01/12/15 0821 01/12/15 1245 01/13/15 0305  NA 136  --  135  K 3.6  --  3.6  CL 103  --  100*  CO2 25  --  26  GLUCOSE 110*  --  125*  BUN 10  --  9  CREATININE 0.82 0.95 0.77  CALCIUM 9.0  --  8.7*    Liver Function Tests: No results for input(s): AST, ALT, ALKPHOS, BILITOT,  PROT, ALBUMIN in the last 168 hours. No results for input(s): LIPASE, AMYLASE in the last 168 hours. No results for input(s): AMMONIA in the last 168 hours.  CBC:  Recent Labs Lab 01/12/15 0821 01/12/15 1245  WBC 7.2 6.2  HGB 9.2* 9.3*  HCT 27.9* 28.5*  MCV 86.4 86.6  PLT 276 292    INR:  Recent Labs Lab 01/09/15 1247 01/12/15 0821 01/12/15 1245 01/13/15 0305  INR 1.2* 1.35 1.35 1.41    Other results:  EKG:   Imaging: Ct Head Wo Contrast  01/12/2015   CLINICAL DATA:  Headache for 1 week, history stroke, LVAD, hypertension, non ischemic cardiomyopathy, CHF, diabetes mellitus, lymphoma in remission  EXAM: CT HEAD WITHOUT CONTRAST  TECHNIQUE: Contiguous axial images were obtained from the base of the skull through the vertex without intravenous contrast.  COMPARISON:  01/28/2015  FINDINGS: Generalized atrophy.  Stable ex vacuo dilatation of the LEFT lateral ventricle.  Large old LEFT basal ganglia infarct extending into external capsule.  Small LEFT thalamus.  Mild small vessel chronic ischemic changes of deep cerebral white matter.  High attenuation hemorrhage and edema seen previously at the RIGHT temporal lobe has resolved, with residual infarct at site.  No recurrent intracranial hemorrhage, mass lesion or additional acute infarct identified.  No extra-axial fluid collections.  Posterior fossa unremarkable.  Visualized paranasal  sinuses and mastoid air cells clear.  No acute osseous findings.  Minimal atherosclerotic calcification at the carotid siphons.  IMPRESSION: Old LEFT basal ganglia and RIGHT temporal lobe infarcts.  Resolution of high attenuation RIGHT temporal lobe hematoma and edema since 12/05/2014.  No new intracranial abnormalities.   Electronically Signed   By: Lavonia Dana M.D.   On: 01/12/2015 08:59      Medications:     Scheduled Medications: . amiodarone  200 mg Oral Daily  . amLODipine  5 mg Oral Daily  . atorvastatin  20 mg Oral QHS  . heparin  5,000  Units Subcutaneous 3 times per day  . insulin glargine  5 Units Subcutaneous QHS  . lisinopril  5 mg Oral Daily  . magnesium oxide  400 mg Oral Q breakfast  . methocarbamol  500 mg Oral 4 times per day  . pantoprazole  40 mg Oral Daily  . sodium chloride  3 mL Intravenous Q12H  . Warfarin - Pharmacist Dosing Inpatient   Does not apply q1800     Infusions:     PRN Medications:  sodium chloride, acetaminophen, albuterol, bisacodyl, morphine injection, oxyCODONE-acetaminophen, sodium chloride, spironolactone, traMADol   Assessment/Plan    1. Headache: Severe, intractable frontal headache. Constant and gradually worse x 1 week. CT head did not show recurrent intracerebral hemorrhage. INR only 1.3 today. No focal neurological deficits noted on exam. Given intractability of headache, will bring in for overnight observation. ?Related to hydralazine increased. Does not seem to be related to Imdur use as he has been on this for a while without problems.  - Hydralazine/Imdur stopped. Continue amlodipine/lisinopril for BP control. -Neurology evaluated --headache. No evidence of ICH.   - Pain control.  2. Chronic systolic HF: Status post LVAD implantation for DT. NYHA class I-II. Not volume overloaded. Has multiple PI events which are sporadic. He is not on a diuretic. Planned for ramp echo next clinic visit.  3. Anticoagulation management: INR goal 1.7-2.2 with recent intracerebral hemorrhage. Off ASA. No recurrent hemorrhage on CT today. INR 1.4  With recent hemorrhage, will not bridge with Lovenox or heparin. Check LDH.  4. H/o Non Hodgkins Lymphoma s/p therapy 5. H/O CVA: Noted on CT.  6. H/O VT/VF: St Jude ICD 2010. Continue amiodarone.  7.DMII 8. PSVT: Quiescent on amiodarone 200 mg daily 9. Recent ICH 5/16.  10. HTN: Map 88-92 . Will not use hydralazine/nitrates for now. Continue amlodipine 5 mg daily + lisinopril 5 mg daily.    I reviewed the LVAD  parameters from today, and compared the results to the patient's prior recorded data.  No programming changes were made.  The LVAD is functioning within specified parameters.  The patient performs LVAD self-test daily.  LVAD interrogation was negative for any significant power changes, alarms or PI events/speed drops.  LVAD equipment check completed and is in good working order.  Back-up equipment present.   LVAD education done on emergency procedures and precautions and reviewed exit site care.  Length of Stay: 1  CLEGG,AMY NP-C  01/13/2015, 7:10 AM  VAD Team --- VAD ISSUES ONLY--- Pager [phone] (7am - 7am)  Advanced Heart Failure Team  Pager [phone] (M-F; 7a - 4p)  Please contact [facility] for night-coverage after hours (4p -7a ) and weekends on [email or web address]  Patient seen with NP, agree with the above note.  BP better, MAP 80s-90s.  No headache, Robaxin appears to have helped.  Few PI events.  I think that he can go  home today.  He will keep appt tomorrow in LVAD clinic for his ramp echo, will let him go home on Robaxin.  Continue to adjust warfarin for goal INR 1.7-2.2.   Loralie Champagne 01/13/2015 7:39 AM

## 2015-01-13 NOTE — Progress Notes (Signed)
ANTICOAGULATION CONSULT NOTE - Follow-Up Consult  Pharmacy Consult for Warfarin Indication: LVAD  No Known Allergies  Patient Measurements: Height: 5\' 7"  (170.2 cm) Weight: 145 lb 4.5 oz (65.9 kg) IBW/kg (Calculated) : 66.1  Vital Signs: Temp: 98.2 F (36.8 C) (07/05 0756) Temp Source: Oral (07/05 0756) BP: 102/73 mmHg (07/05 0800) Pulse Rate: 72 (07/05 0800)  Labs:  Recent Labs  01/12/15 0821 01/12/15 1245 01/13/15 0305  HGB 9.2* 9.3*  --   HCT 27.9* 28.5*  --   PLT 276 292  --   LABPROT 16.8* 16.8* 17.3*  INR 1.35 1.35 1.41  CREATININE 0.82 0.95 0.77    Estimated Creatinine Clearance: 101.8 mL/min (by C-G formula based on Cr of 0.77).   Medical History: Past Medical History  Diagnosis Date  . Paroxysmal ventricular tachycardia 2005, 2010    s/p AICD '05, replaced w/ St. Jude's in 2010 (PVT/V.Fib arrest requiring ICD implant in 2005)  . HYPERTENSION, UNSPECIFIED   . HYPERLIPIDEMIA-MIXED   . 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 Nonischemic cardiomyopathy     2/2 Adriamycin administration for lymphoma  . CHF (congestive heart failure)     EF 15% by echo 2009; s/p St. Jude ICD  . Diabetes mellitus     Type 2  . Cancer 1992, 2007     non hodkins lymphoma 1992, Hodgkins 2007  . Depression   . LV (left ventricular) mural thrombus 2009    2009, on coumadin  . Small bowel obstruction 2001    s/p Small bowel resection 2001  . Jejunal intussusception 2008    2008  . 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 Splenic mass 2009    Noted on Abd [place] 2009 w/ recs for f/u CT - not done  . Hypotension   . Noncompliance with medication regimen   . AICD (automatic cardioverter/defibrillator) present 2005, 2010  . Presence of permanent cardiac pacemaker     Medications:  Prescriptions prior to admission  Medication Sig  Dispense Refill Last Dose  . acetaminophen (TYLENOL) 650 MG CR tablet Take 1 tablet (650 mg total) by mouth every 8 (eight) hours as needed for pain. 14 tablet 0 unkn  . albuterol (PROVENTIL HFA;VENTOLIN HFA) 108 (90 BASE) MCG/ACT inhaler Inhale 1-2 puffs into the lungs every 6 (six) hours as needed for wheezing or shortness of breath.   unkn  . amiodarone (PACERONE) 200 MG tablet Take 1 tablet (200 mg total) by mouth daily. 30 tablet 6 01/11/2015 at Unknown time  . atorvastatin (LIPITOR) 40 MG tablet Take 0.5 tablets (20 mg total) by mouth at bedtime. 30 tablet 6 01/11/2015 at Unknown time  . bisacodyl (DULCOLAX) 5 MG EC tablet Take 1 tablet (5 mg total) by mouth daily as needed for moderate constipation. 30 tablet 0 unkn  . hydrALAZINE (APRESOLINE) 100 MG tablet Take 1 tablet (100 mg total) by mouth 3 (three) times daily. 270 tablet 1 01/11/2015 at Unknown time  . insulin glargine (LANTUS) 100 UNIT/ML injection Inject 0.05 mLs (5 Units total) into the skin at bedtime. 10 mL 11 01/11/2015 at Unknown time  . isosorbide mononitrate (IMDUR) 30 MG 24 hr tablet Take 1 tablet (30 mg total) by mouth daily. 30 tablet 6 01/11/2015 at Unknown time  . Magnesium 400 MG TABS Take 1 tablet by mouth daily with breakfast.  90 tablet 3 01/11/2015 at Unknown time  . pantoprazole (PROTONIX) 40 MG tablet Take 1 tablet (40 mg total) by mouth daily. 30 tablet 6 01/11/2015 at Unknown time  . RA ASPIRIN EC 325 MG EC tablet Take 325 mg by mouth daily.  0 01/11/2015 at Unknown time  . spironolactone (ALDACTONE) 25 MG tablet Take 25 mg by mouth daily as needed (fluid).   unkn  . traMADol (ULTRAM) 50 MG tablet Take 1-2 tablets (50-100 mg total) by mouth every 6 (six) hours as needed for moderate pain. 30 tablet 0 01/12/2015 at Unknown time  . warfarin (COUMADIN) 5 MG tablet Take 0.5-1 tablets (2.5-5 mg total) by mouth daily. 2.5 mg every day except 5 mg Monday and Wednesday 45 tablet 3 01/11/2015 at Unknown time    Assessment: 54 yo M admitted  01/12/2015 with intractable headache. Patient with LVAD (placed 10/13/14) on warfarin and spontaneous ICH last month.  Pharmacy consulted to dose warfarin.  Coag: LVAD INR goal 1.7-2.2 , 7/4 CT head w/o contrast done in ER: 2 prior ischemic CVAs noted, intracerebral hemorrhage from 5/16 resolved, no new findings.   INR < goal on admission, INR has been labile during June on 1 to 3 doses of 5 mg per week with 2.5 mg daily dose ( last dose PTA 7/3).   No plan to bridge with heparin/lovenox   PTA 2.5 mg daily except 5 mg Mon & Wed; however - this dose recently changed 7/1 to 2.5 mg daily except 5 mg on Mon/Fri.  Goal of Therapy:  INR 1.7-2.2  Plan:  Warfarin 5 mg today, then back to "new" home dose of 2.5 mg daily except 5 mg on Mon and Fri. Recheck INR in a week if able. (or sooner)  Thank you for allowing pharmacy to be a part of this patients care team.  Uvaldo Rising, BCPS  Clinical Pharmacist Pager [phone]  01/13/2015 8:42 AM

## 2015-01-13 NOTE — Progress Notes (Signed)
DC orders received.  Patient stable with no S/S of distress.  Medication and discharge information reviewed with patient and patient's wife.  Patient DC home with wife. Milinda Sweeney Marie  

## 2015-01-13 NOTE — Discharge Summary (Signed)
Advanced Heart Failure Team  Discharge Summary   Patient ID: Andre Tran MRN: [id number], DOB/AGE: January 30, 1961 54 y.o. Admit date: 01/12/2015 D/C date:     01/13/2015   Primary Discharge Diagnoses:  1. Headache 2. Chronic Systolic Heart Failure: S/P Post LVAD implantation for DT 10/13/2014  3. S/P ICH 11/2014  4. Anticoagulation- INR goal 1.7-2.2  5. H/O Non Hodgkins 6. H/O CVA 7. H/O VT/VF St Jude ICD 2010.   Hospital Course:   Andre Tran is a 54 y.o. 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 Underwent HMII LVAD placement 10/13/2014 .   Admitted 5/16 with headache and CT showed spontaneous intracerebral bleed with INR 3.1. Given vit k 5 and 2 u FFP. Repeat head CT the next am: showed Enlarging RIGHT posterior temporal lobe hematomas 3.2 cm, previously 2.6 cm with similar local mass effect. Seen by Dr. Arnoldo Morale in [place] and treated conservatively. Remained stable from a neuro perspective. Repeat CT prior to d/c was stable. ASA stopped and Warfarin restarted, aiming for INR 1.7-.2.2.  Admitted from ED with headache. CT performed with no evidence of ICH. He was evaluated by Neuro. Clinically improved and had fewer PI events on the day of discharge.  1. Headache: Severe, intractable frontal headache. Admitted 7/4. CT head did not show recurrent intracerebral hemorrhage. INR on admit was 1.3. No focal neurological deficits noted on exam. There was a question regarding  Hydralazine.  Does not seem to be related to Imdur use as he has been on this for a while without problems.  --Neurology consulted - No evidence of ICH but felt it was a headache.He was placed on robaxin and benadryl.   On the day of discharge his pain resolved. He will remain off hydralazine/imdur.  2. Chronic systolic HF: Status post LVAD implantation for DT. NYHA class I-II. Not volume overloaded. Had multiple PI events noted on admit. He is not on a diuretic. Planned for ramp echo next few weeks.   3. Anticoagulation management: INR goal 1.7-2.2 with recent intracerebral hemorrhage. Off ASA. No recurrent hemorrhage on CT today. INR 1.4 With recent hemorrhage, will not bridge with Lovenox or heparin.   4. H/o Non Hodgkins Lymphoma s/p therapy 5. H/O CVA: Noted on CT.  6. H/O VT/VF: St Jude ICD 2010. Continue amiodarone.  7.DMII 8. PSVT: Quiescent on amiodarone 200 mg daily 9. Recent ICH 5/16.  10. HTN: Map 88-92 . Will not use hydralazine/nitrates for now. Continue amlodipine 5 mg daily + lisinopril 5 mg daily.   Dr Aundra Dubin evaluated and deemed him stable for discharge.      LVAD INTERROGATION:  HeartMate II LVAD: Flow 3.5 liters/min, speed 9000, power 4.5, PI 7.1 Rare PI events.   Discharge Weight Range: 145 pounds.  Discharge Vitals: Blood pressure 102/73, pulse 72, temperature 98.2 F (36.8 C), temperature source Oral, resp. rate 12, height 5\' 7"  (1.702 m), weight 145 lb 4.5 oz (65.9 kg), SpO2 99 %.  Labs: Lab Results  Component Value Date   WBC 6.2 01/12/2015   HGB 9.3* 01/12/2015   HCT 28.5* 01/12/2015   MCV 86.6 01/12/2015   PLT 292 01/12/2015    Recent Labs Lab 01/13/15 0305  NA 135  K 3.6  CL 100*  CO2 26  BUN 9  CREATININE 0.77  CALCIUM 8.7*  GLUCOSE 125*   Lab Results  Component Value Date   CHOL 106 06/21/2014   HDL 36* 06/21/2014   LDLCALC 58 06/21/2014   TRIG 61 06/21/2014   BNP (last 3 results)  Recent Labs  10/20/14 0407 11/05/14 1030 12/19/14 1115  BNP 535.3* 118.8* 82.3    ProBNP (last 3 results)  Recent Labs  06/12/14 1415 06/18/14 1200  PROBNP 2079.0* 2144.0*     Diagnostic Studies/Procedures   Ct Head Wo  Contrast  01/12/2015   CLINICAL DATA:  Headache for 1 week, history stroke, LVAD, hypertension, non ischemic cardiomyopathy, CHF, diabetes mellitus, lymphoma in remission  EXAM: CT HEAD WITHOUT CONTRAST  TECHNIQUE: Contiguous axial images were obtained from the base of the skull through the vertex without intravenous contrast.  COMPARISON:  01/28/2015  FINDINGS: Generalized atrophy.  Stable ex vacuo dilatation of the LEFT lateral ventricle.  Large old LEFT basal ganglia infarct extending into external capsule.  Small LEFT thalamus.  Mild small vessel chronic ischemic changes of deep cerebral white matter.  High attenuation hemorrhage and edema seen previously at the RIGHT temporal lobe has resolved, with residual infarct at site.  No recurrent intracranial hemorrhage, mass lesion or additional acute infarct identified.  No extra-axial fluid collections.  Posterior fossa unremarkable.  Visualized paranasal sinuses and mastoid air cells clear.  No acute osseous findings.  Minimal atherosclerotic calcification at the carotid siphons.  IMPRESSION: Old LEFT basal ganglia and RIGHT temporal lobe infarcts.  Resolution of high attenuation RIGHT temporal lobe hematoma and edema since 12/05/2014.  No new intracranial abnormalities.   Electronically Signed   By: Lavonia Dana M.D.   On: 01/12/2015 08:59    Discharge Medications     Medication List    STOP taking these medications        hydrALAZINE 100 MG tablet  Commonly known as:  APRESOLINE     isosorbide mononitrate 30 MG 24 hr tablet  Commonly known as:  IMDUR     RA ASPIRIN EC 325 MG EC tablet  Generic drug:  aspirin      TAKE these medications        acetaminophen 650 MG CR tablet  Commonly known as:  TYLENOL  Take 1 tablet (650 mg total) by mouth every 8 (eight) hours as needed for pain.     albuterol 108 (90 BASE) MCG/ACT inhaler  Commonly known as:  PROVENTIL HFA;VENTOLIN HFA  Inhale 1-2 puffs into the lungs every 6 (six) hours as needed for  wheezing or shortness of breath.     amiodarone 200 MG tablet  Commonly known as:  PACERONE  Take 1 tablet (200 mg total) by mouth daily.     amLODipine 5 MG tablet  Commonly known as:  NORVASC  Take 1 tablet (5 mg total) by mouth daily.     atorvastatin 40 MG tablet  Commonly known as:  LIPITOR  Take 0.5 tablets (20 mg total) by mouth at bedtime.     bisacodyl 5 MG EC tablet  Commonly known as:  DULCOLAX  Take 1 tablet (5 mg total) by mouth daily as needed for moderate constipation.     insulin glargine 100 UNIT/ML injection  Commonly known as:  LANTUS  Inject 0.05 mLs (5 Units total) into the skin at bedtime.     lisinopril 5 MG tablet  Commonly known as:  PRINIVIL,ZESTRIL  Take 1 tablet (  5 mg total) by mouth daily.     Magnesium 400 MG Tabs  Take 1 tablet by mouth daily with breakfast.     methocarbamol 500 MG tablet  Commonly known as:  ROBAXIN  Take 1 tablet (500 mg total) by mouth every 6 (six) hours.     pantoprazole 40 MG tablet  Commonly known as:  PROTONIX  Take 1 tablet (40 mg total) by mouth daily.     spironolactone 25 MG tablet  Commonly known as:  ALDACTONE  Take 25 mg by mouth daily as needed (fluid).     traMADol 50 MG tablet  Commonly known as:  ULTRAM  Take 1-2 tablets (50-100 mg total) by mouth every 6 (six) hours as needed for moderate pain.     warfarin 5 MG tablet  Commonly known as:  COUMADIN  Take 0.5-1 tablets (2.5-5 mg total) by mouth daily. Take 5 mg 01/13/15  then 2.5 mg every day except 5 mg Monday and Friday        Disposition   The patient will be discharged in stable condition to home.     Discharge Instructions    ACE Inhibitor / ARB already ordered    Complete by:  As directed      Diet - low sodium heart healthy    Complete by:  As directed      Heart Failure patients record your daily weight using the same scale at the same time of day    Complete by:  As directed      Increase activity slowly    Complete by:  As  directed           Follow-up Information    Follow up with Loralie Champagne, MD On 01/14/2015.   Specialty:  Cardiology   Why:  at 1000 for ECHO followed by visit. Garage Code [id number]   Contact information:   [place] [address] [phone]         Duration of Discharge Encounter: Greater than 35 minutes   Signed, CLEGG,AMY NP-C  01/13/2015, 8:53 AM

## 2015-01-13 NOTE — Discharge Instructions (Signed)

## 2015-01-14 ENCOUNTER — Ambulatory Visit (HOSPITAL_COMMUNITY): Payer: Medicare Other

## 2015-01-14 ENCOUNTER — Encounter (HOSPITAL_COMMUNITY): Payer: Medicare Other

## 2015-01-15 ENCOUNTER — Other Ambulatory Visit: Payer: Medicare Other

## 2015-01-15 ENCOUNTER — Telehealth (HOSPITAL_COMMUNITY): Payer: Self-pay | Admitting: Infectious Diseases

## 2015-01-15 NOTE — Telephone Encounter (Signed)
Left message re: missed scheduled INR appointment today

## 2015-01-16 ENCOUNTER — Other Ambulatory Visit (INDEPENDENT_AMBULATORY_CARE_PROVIDER_SITE_OTHER): Payer: Medicare Other | Admitting: *Deleted

## 2015-01-16 ENCOUNTER — Telehealth (HOSPITAL_COMMUNITY): Payer: Self-pay | Admitting: *Deleted

## 2015-01-16 ENCOUNTER — Ambulatory Visit (HOSPITAL_COMMUNITY): Payer: Self-pay | Admitting: *Deleted

## 2015-01-16 DIAGNOSIS — Z7901 Long term (current) use of anticoagulants: Secondary | ICD-10-CM | POA: Diagnosis not present

## 2015-01-16 DIAGNOSIS — Z95811 Presence of heart assist device: Secondary | ICD-10-CM | POA: Diagnosis not present

## 2015-01-16 LAB — PROTIME-INR
INR: 1.5 ratio — ABNORMAL HIGH (ref 0.8–1.0)
PROTHROMBIN TIME: 16.4 s — AB (ref 9.6–13.1)

## 2015-01-16 NOTE — Telephone Encounter (Signed)
Called pt's wife re: missed INR appt yesterday. She agreed to take pt for INR check this am.

## 2015-01-19 ENCOUNTER — Ambulatory Visit (HOSPITAL_COMMUNITY): Payer: Medicare Other

## 2015-01-21 ENCOUNTER — Ambulatory Visit (HOSPITAL_COMMUNITY): Payer: Medicare Other

## 2015-01-23 ENCOUNTER — Ambulatory Visit (HOSPITAL_COMMUNITY): Payer: Medicare Other

## 2015-01-26 ENCOUNTER — Other Ambulatory Visit (HOSPITAL_COMMUNITY): Payer: Self-pay | Admitting: Internal Medicine

## 2015-01-26 ENCOUNTER — Telehealth (HOSPITAL_COMMUNITY): Payer: Self-pay | Admitting: Infectious Diseases

## 2015-01-26 ENCOUNTER — Ambulatory Visit (HOSPITAL_COMMUNITY)
Admission: RE | Admit: 2015-01-26 | Discharge: 2015-01-26 | Disposition: A | Discharge status: Home / Self Care | Payer: Medicare Other | Source: Ambulatory Visit | Attending: Internal Medicine | Admitting: Internal Medicine

## 2015-01-26 ENCOUNTER — Ambulatory Visit (HOSPITAL_COMMUNITY): Payer: Medicare Other

## 2015-01-26 ENCOUNTER — Encounter (HOSPITAL_COMMUNITY): Payer: Self-pay

## 2015-01-26 ENCOUNTER — Ambulatory Visit (HOSPITAL_BASED_OUTPATIENT_CLINIC_OR_DEPARTMENT_OTHER)
Admission: RE | Admit: 2015-01-26 | Discharge: 2015-01-26 | Disposition: A | Discharge status: Home / Self Care | Payer: Medicare Other | Source: Ambulatory Visit | Attending: Internal Medicine | Admitting: Internal Medicine

## 2015-01-26 ENCOUNTER — Other Ambulatory Visit (HOSPITAL_COMMUNITY): Payer: Self-pay | Admitting: Infectious Diseases

## 2015-01-26 ENCOUNTER — Encounter (HOSPITAL_COMMUNITY): Payer: Self-pay | Admitting: Infectious Diseases

## 2015-01-26 VITALS — BP 98/74 | HR 84 | Temp 99.1°F | Ht 67.0 in | Wt 146.0 lb

## 2015-01-26 DIAGNOSIS — R509 Fever, unspecified: Secondary | ICD-10-CM | POA: Insufficient documentation

## 2015-01-26 DIAGNOSIS — Z95811 Presence of heart assist device: Secondary | ICD-10-CM

## 2015-01-26 DIAGNOSIS — I471 Supraventricular tachycardia: Secondary | ICD-10-CM | POA: Insufficient documentation

## 2015-01-26 DIAGNOSIS — R0609 Other forms of dyspnea: Secondary | ICD-10-CM | POA: Diagnosis not present

## 2015-01-26 DIAGNOSIS — I509 Heart failure, unspecified: Secondary | ICD-10-CM

## 2015-01-26 DIAGNOSIS — I5041 Acute combined systolic (congestive) and diastolic (congestive) heart failure: Secondary | ICD-10-CM

## 2015-01-26 DIAGNOSIS — Z8572 Personal history of non-Hodgkin lymphomas: Secondary | ICD-10-CM | POA: Insufficient documentation

## 2015-01-26 DIAGNOSIS — Z7901 Long term (current) use of anticoagulants: Secondary | ICD-10-CM | POA: Insufficient documentation

## 2015-01-26 DIAGNOSIS — Z79899 Other long term (current) drug therapy: Secondary | ICD-10-CM | POA: Insufficient documentation

## 2015-01-26 DIAGNOSIS — R630 Anorexia: Secondary | ICD-10-CM

## 2015-01-26 DIAGNOSIS — E785 Hyperlipidemia, unspecified: Secondary | ICD-10-CM | POA: Insufficient documentation

## 2015-01-26 DIAGNOSIS — I082 Rheumatic disorders of both aortic and tricuspid valves: Secondary | ICD-10-CM | POA: Insufficient documentation

## 2015-01-26 DIAGNOSIS — R634 Abnormal weight loss: Secondary | ICD-10-CM | POA: Insufficient documentation

## 2015-01-26 DIAGNOSIS — I5022 Chronic systolic (congestive) heart failure: Secondary | ICD-10-CM | POA: Insufficient documentation

## 2015-01-26 DIAGNOSIS — Z8673 Personal history of transient ischemic attack (TIA), and cerebral infarction without residual deficits: Secondary | ICD-10-CM | POA: Insufficient documentation

## 2015-01-26 DIAGNOSIS — I10 Essential (primary) hypertension: Secondary | ICD-10-CM | POA: Insufficient documentation

## 2015-01-26 DIAGNOSIS — E119 Type 2 diabetes mellitus without complications: Secondary | ICD-10-CM | POA: Insufficient documentation

## 2015-01-26 DIAGNOSIS — R55 Syncope and collapse: Secondary | ICD-10-CM | POA: Insufficient documentation

## 2015-01-26 LAB — COMPREHENSIVE METABOLIC PANEL
ALK PHOS: 140 U/L — AB (ref 38–126)
ALT: 61 U/L (ref 17–63)
ANION GAP: 12 (ref 5–15)
AST: 81 U/L — ABNORMAL HIGH (ref 15–41)
Albumin: 4.1 g/dL (ref 3.5–5.0)
BUN: 13 mg/dL (ref 6–20)
CO2: 24 mmol/L (ref 22–32)
CREATININE: 1.36 mg/dL — AB (ref 0.61–1.24)
Calcium: 9.4 mg/dL (ref 8.9–10.3)
Chloride: 93 mmol/L — ABNORMAL LOW (ref 101–111)
GFR calc Af Amer: >60 mL/min (ref >60)
GFR calc non Af Amer: 59 mL/min — ABNORMAL LOW (ref >60)
GLUCOSE: 151 mg/dL — AB (ref 65–99)
Potassium: 4 mmol/L (ref 3.5–5.1)
Sodium: 129 mmol/L — ABNORMAL LOW (ref 135–145)
Total Bilirubin: 1 mg/dL (ref 0.3–1.2)
Total Protein: 8.2 g/dL — ABNORMAL HIGH (ref 6.5–8.1)

## 2015-01-26 LAB — CBC
HCT: 36.3 % — ABNORMAL LOW (ref 39.0–52.0)
Hemoglobin: 12.4 g/dL — ABNORMAL LOW (ref 13.0–17.0)
MCH: 29.1 pg (ref 26.0–34.0)
MCHC: 34.2 g/dL (ref 30.0–36.0)
MCV: 85.2 fL (ref 78.0–100.0)
PLATELETS: 304 10*3/uL (ref 150–400)
RBC: 4.26 MIL/uL (ref 4.22–5.81)
RDW: 14.1 % (ref 11.5–15.5)
WBC: 9.2 10*3/uL (ref 4.0–10.5)

## 2015-01-26 LAB — LACTATE DEHYDROGENASE: LDH: 395 U/L — ABNORMAL HIGH (ref 98–192)

## 2015-01-26 LAB — PROTIME-INR
INR: 2.34 — ABNORMAL HIGH (ref 0.00–1.49)
Prothrombin Time: 25.4 seconds — ABNORMAL HIGH (ref 11.6–15.2)

## 2015-01-26 LAB — PREALBUMIN: Prealbumin: 12.6 mg/dL — ABNORMAL LOW (ref 18–38)

## 2015-01-26 LAB — BRAIN NATRIURETIC PEPTIDE: B Natriuretic Peptide: 63 pg/mL (ref 0.0–100.0)

## 2015-01-26 LAB — MAGNESIUM: Magnesium: 2 mg/dL (ref 1.7–2.4)

## 2015-01-26 MED ORDER — ENSURE NUTRITION SHAKE PO LIQD: 1.0000 | Freq: Two times a day (BID) | ORAL | Status: AC

## 2015-01-26 MED ORDER — ENSURE NUTRITION SHAKE PO LIQD: 1.0000 | Freq: Two times a day (BID) | ORAL | Status: DC

## 2015-01-26 NOTE — Progress Notes (Addendum)
Symptom  Yes  No  Details   Angina        x Activity:   Claudication        x       How far: walking one block (chronic)  Syncope        x When:   Stroke        x  Right sided residuals and neuro residuals   Orthopnea        x How many pillows:  1-5 for comfort   PND        x How often:  CPAP     N/A How many hrs:   Pedal edema        x   Abd fullness        x   N&V        x  appetite fair; eating full meals at times  Diaphoresis        x When:  Bleeding       x   Urine color   Dark yellow - yellow   SOB        x Activity:   Palpitations        x When:  ICD shock        x   Hospitlizaitons        x When/where/why:  ED visit             x  When/where/why:  Other MD        x When/who/why:  Activity         No limitations; home stairs, gardening, errands, walking 4.5 blocks 3 days a week.   Fluid    1 - 2 liters   Diet    Low sodium; very poor appetite   Vital signs: HR: 84 Doppler BP: 102 Auto BP: 98/74 (86) O2 Sat:  94% Wt:  146.0 Last wt: 145.0 Ht: 5'7"  LVAD interrogation & Equipment Check: Speed:  9000  Flow:  4.8 Power: 5.1 PI: 6.9 Alarms: one low voltage advisory  Events: Has had 10 - 30 daily. RAMP echo completed this morning prior to clinic visit and speed reset to below:   Fixed speed: 8800  Low speed limit: 8200  Primary Controller:  Replace back up battery in 23 months. Back up controller:   Replace back up battery in 23 months.  I reviewed the LVAD parameters from today, and compared the results to the patient's prior recorded data. No programming changes were made. The LVAD is functioning within specified parameters. The patient performs LVAD self-test daily. LVAD interrogation was negative for any power changes or alarms.  He is having a high amount of PI events of which he is asymptomatic for. LV on this RAMP study was 3.65 cm on 9000. LVAD equipment check completed and is in good working order. Back-up equipment present. LVAD education done on  emergency procedures and precautions and reviewed exit site care. Pt is performing daily controller and system monitor self tests along with completing weekly and monthly maintenance for LVAD equipment. LVAD education done on emergency procedures and precautions and reviewed exit site care.   Driveline Care: Dressing removed today and site looks great. No drainage, erythema, induration, tenderness noted or reported. Velour fully implanted below skin and full tissue ingrowth taken place. Anchor device is in place correctly. Site cleansed by patient's wife according to policy with CHG swabs x 2 and new Sorbaview dressing applied. Anchor reapplied. Adequate Dressing supplies given  to patient and his wife for 8 more weeks.   Encounter Details: Presents today with his wife for F/U since hospital D/C on 01/13/15. No further headaches since medication changes. Does report having had fevers as high as 101.5 since Saturday. No other reported associated symptoms--denies cough/SOB/sore throat, painful or frequent urination, DL site WNL and has not been around anyone sick lately. D/W Dr. Haroldine Laws and will obtain [facility] x 2 and urinalysis to assess for potential causes of infection. (Unable to obtain urine culture--pt unable to void during clinic visit).    The only other complaint he and his wife have today is that he has a significantly decreased appetite and has been losing weight. Hospital D/C weight on 10/30/14 was 162.4 and today's weight at home was 139 lbs. He claims that he does not have an appetite after he takes his medications. Not taking any TID medications anymore so discussed taking the medications after he eats a meal in the morning. Started on BID Ensure (prescription sent for them to take to [place]). Continue with daily weights. Advised that Ensure contains a good amount of Vitamin K in them--will likely need to adjust Coumadin dose once starts on it. He is slightly supra-therapeutic today at 2.32 (Goal INR 1.7  - 2.2). Getting set up with mdINR for POC checks this week (difficult lab stick). Consider discussing Remeron with provider if no improvement with Ensure since Megace is pro-thrombotic.   PI events are still very frequent 10 - 30 daily. RAMP speed study done prior to clinic visit today (see below): Speed  Flow  PI  Power  LVIDD  AI  Aortic openings  MR  TR  Septum  RV    9000  4.8  6.9  5.1  3.65 cm  Trace AI  0/5  trivial to none  trace  Midline  Normal function    8800  4.7  6.9  4.8  3.85 cm   0/5   Trivial TR  Midline sl left at base     8600  4.9  6.4  4.7  4.0 cm   Slight openingswith each beat        Ramp ECHO performed at bedside with Dr. Haroldine Laws and [name].  At completion of ramp study, patients primary and back up controller programmed:  Fixed speed: 8800  Low speed limit: 8200  Inflow cannula angulated slightly to septum. If PI's still continue on current settings will consider dropping to 8600.     Device interrogated last visit and atrial detection threshold was decreased to 140 bpm. EKG was sinus today during bedside echo.   Reports that they will be going to [facility] July 21 - 24. Travel letter including nearest [place] center and 24h contact provided during this encounter.   3 month INTERMACs QOL surveys completed by patient without assistance, neurocognitive trailmaking completed correctly in 37m 21s, and during 6MW patient completed 1000' without rest but with frequent stumbling over feet. This is 100' shorter than his pre-implant 6MW.   F/U in 2 weeks per Dr. Oscar La, RN [facility] Coordinator  Office: [phone] 24/7 [place] Pager: [phone]

## 2015-01-26 NOTE — Progress Notes (Signed)
  Echocardiogram 2D Echocardiogram has been performed.  Donata Clay 01/26/2015, 10:32 AM

## 2015-01-26 NOTE — Patient Instructions (Addendum)
1. Stop Lisinopril. No Spironolactone (I corrected your medication profile today)  2. If you continue having fevers through Wednesday, please call and let us know.   3. No changes for your coumadin today--INR is 2.32. Getting set up with home INR machine. Will check INR next week on Monday 02/02/15. Monday's will be your day to test.   4. Starting Ensure twice a day--this does have a good amount of Vitamin K in them so we will likely need to adjust your coumadin.

## 2015-01-26 NOTE — Progress Notes (Signed)
Patient ID: Andre Tran, male   DOB: 05-Mar-1961, 54 y.o.   MRN: [id number]  PCP: [facility] Dr Aloha Gell Primary HF: Dr Haroldine Laws.    HPI: Mr Flenner is a 54 y.o. 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 Underwent HMII LVAD placement 10/13/2014   Admitted April 1st 2016 for schedule HMII implant. Post implant he was taken back to the OR for bleeding otherwise his post operative course was uneventful. LVAD speed turned down to 9000 on the day of discharge. Discharge weight was 160 pounds.   Admitted 5/16 with headache and CT showed spontaneous intracerebral bleed with INR 3.1. Given vit k 5 and 2 u FFP. Repeat head CT the next am: showed Enlarging RIGHT posterior temporal lobe hematomas 3.2 cm, previously 2.6 cm with similar local mass-effect. Seen by Dr. Arnoldo Morale in [place] and treated conservatively. Remained stable from a neuro perspective. Repeat CT prior to d/c was stable. ASA stopped and Warfarin restarted.  Follow up for Heart Failure/LVAD: Since the last visit he was readmitted with headache. CT negative for recurrent ICH. Hydralazine/imdur stopped. He continued on lisinopril/amlodipine. Denies SOB/PND/Orthopna. Has had poor appetite and early satiety. Has dropped ~ 30 pounds since VAD. Over past 1-2 days has had fevers at home 101. No night sweats, cough, dysuria, drainage from driveline or other localizing symptoms . Taking all medications.     Denies driveline trauma, erythema or drainage.  Denies ICD shocks.   Reports taking Coumadin as prescribed and adherence to anticoagulation based dietary restrictions.  Denies bright red blood per rectum or melena, no dark urine or hematuria.      Past Medical History  Diagnosis Date  . Paroxysmal ventricular tachycardia 2005, 2010    s/p AICD '05, replaced w/ St. Jude's in 2010 (PVT/V.Fib arrest requiring ICD implant in 2005)  . HYPERTENSION, UNSPECIFIED   . HYPERLIPIDEMIA-MIXED   . 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 Nonischemic cardiomyopathy     2/2 Adriamycin administration for lymphoma  . CHF (congestive heart failure)     EF 15% by echo 2009; s/p St. Jude ICD  . Diabetes mellitus     Type 2  . Cancer 1992, 2007     non hodkins lymphoma 1992, Hodgkins 2007  . Depression   . LV (left ventricular) mural thrombus 2009    2009, on coumadin  . Small bowel obstruction 2001    s/p Small bowel resection 2001  . Jejunal intussusception 2008    2008  . 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 Splenic mass 2009    Noted on Abd [place] 2009 w/ recs for f/u CT - not done  . Hypotension   . Noncompliance with medication regimen   . AICD (automatic cardioverter/defibrillator) present 2005, 2010  . Presence of permanent cardiac pacemaker     Current Outpatient Prescriptions  Medication Sig Dispense Refill  . acetaminophen (TYLENOL) 650 MG CR tablet Take 1 tablet (650 mg total) by mouth every 8 (eight) hours as needed for pain. 14 tablet 0  . albuterol (PROVENTIL HFA;VENTOLIN  HFA) 108 (90 BASE) MCG/ACT inhaler Inhale 1-2 puffs into the lungs every 6 (six) hours as needed for wheezing or shortness of breath.    Marland Kitchen amiodarone (PACERONE) 200 MG tablet Take 1 tablet (200 mg total) by mouth daily. 30 tablet 6  . amLODipine (NORVASC) 5 MG tablet Take 1 tablet (5 mg total) by mouth daily. 30 tablet 6  . atorvastatin (LIPITOR) 40 MG tablet Take 0.5 tablets (20 mg total) by mouth at bedtime. 30 tablet 6  . bisacodyl (DULCOLAX) 5 MG EC tablet Take 1 tablet (5 mg total) by mouth daily as needed  for moderate constipation. 30 tablet 0  . insulin glargine (LANTUS) 100 UNIT/ML injection Inject 0.05 mLs (5 Units total) into the skin at bedtime. 10 mL 11  . Magnesium 400 MG TABS Take 1 tablet by mouth daily with breakfast. 90 tablet 3  . methocarbamol (ROBAXIN) 500 MG tablet Take 1 tablet (500 mg total) by mouth every 6 (six) hours. 60 tablet 6  . pantoprazole (PROTONIX) 40 MG tablet Take 1 tablet (40 mg total) by mouth daily. 30 tablet 6  . spironolactone (ALDACTONE) 25 MG tablet Take 25 mg by mouth daily as needed (fluid).    . warfarin (COUMADIN) 5 MG tablet Take 0.5-1 tablets (2.5-5 mg total) by mouth daily. Take 5 mg 01/13/15  then 2.5 mg every day except 5 mg Monday and Friday 45 tablet 3  . lisinopril (PRINIVIL,ZESTRIL) 5 MG tablet Take 1 tablet (5 mg total) by mouth daily. (Patient not taking: Reported on 01/26/2015) 30 tablet 6  . Nutritional Supplements (ENSURE NUTRITION SHAKE) LIQD Take 1 Bottle by mouth 2 (two) times daily at 10 AM and 5 PM. 60 Bottle 11   No current facility-administered medications for this encounter.    Hydralazine  REVIEW OF SYSTEMS: All systems negative except as listed in HPI, PMH and Problem list.   LVAD interrogation & Equipment Check: Speed: 9000 Flow: 4.8 Power: 5.1 PI: 6.9 Alarms: one low voltage advisory  Events: Has had 20 - 30 daily.  Fixed speed: 9000 Low speed limit: 8400 Primary Controller: Replace back up battery in 24 months. Back up controller: Replace back up battery in 24 months.  After RAMP ECHO dropped speed to 8800   I reviewed the LVAD parameters from today, and compared the results to the patient's prior recorded data.  No programming changes were made.  The LVAD is functioning within specified parameters.  The patient performs LVAD self-test daily.  LVAD interrogation was negative for any significant power changes, alarms or PI events/speed drops.  LVAD equipment check completed and is in good working order.   Back-up equipment present.   LVAD education done on emergency procedures and precautions and reviewed exit site care.    Filed Vitals:   01/26/15 1025 01/26/15 1026  BP: 102/0 98/74  Pulse: 84   Temp: 99.1 F (37.3 C)   TempSrc: Oral   Height: 5\' 7"  (1.702 m)   Weight: 146 lb (66.225 kg)   SpO2: 94%     Physical Exam: GENERAL: Thin. Fatigued appearing, male who presents to clinic today in no acute distress with his wife.  HEENT: normal  NECK: Supple, JVP flat  .  2+ bilaterally, no bruits.  No lymphadenopathy or thyromegaly appreciated.   CARDIAC:  Mechanical heart sounds with LVAD hum present.  LUNGS:  Clear to auscultation bilaterally.  ABDOMEN:  Soft, round, nontender, positive bowel sounds x4.     LVAD exit site: well-healed and  incorporated.  Dressing dry and intact.  No erythema or drainage.  Stabilization device present and accurately applied.  Driveline dressing is being changed daily per sterile technique. EXTREMITIES:  Warm and dry, no cyanosis, clubbing, rash or edema  NEUROLOGIC:  Alert and oriented x 4.  Gait steady.  No aphasia.  No dysarthria.  Affect pleasant.      ASSESSMENT AND PLAN:  1. Chronic systolic HF - Status post LVAD implantation for DT.   -Doing well from HF perspective NYHA I-II but now with progressive weight loss.  -Multiple PIs on VAD. We did ramp echo in clinic and LV smaller. Speed turned down to 8800. (Did not want to go lower due to previous embolic event.) - Volume status ok off diuretics. 2.  Anticoagulation management - INR goal 2.0-2.5 with recent ICH. Off ASA.  Will evaluate INR value today and follow up with patient for necessary changes.  See anticoagulation flow sheet. Plans to start point of care INR soon.  3. H/o Non Hodgkins Lymphoma s/p therapy 4.  H/O CVA 2008  5. H/O VT/VF --St Jude ICD 2010  6. DMII 7. 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 Recent ICH 5/16.  -stable no new neuro deficits 9. Poor appetite/weight  loss - Very concerning. No clear cause. He wants to try Ensure. Can also consider Remeron.  10. Fever- No clear source. WBC ok. Check cultures. Hopefully just viral illness.   Follow up in 2 weeks.  CLEGG,AMY NP-C  12:06 PM   Patient seen and examined with Darrick Grinder, NP. We discussed all aspects of the encounter. I agree with the assessment and plan as stated above.   I have edited note with my findings. Fever and weight loss concerning. Work-up underway. I was present for entire RAMP study and changed speed to 8800.  Gamal Todisco,MD 11:50 PM

## 2015-01-26 NOTE — Progress Notes (Unsigned)
Speed  Flow  PI  Power  LVIDD  AI  Aortic openings  MR  TR  Septum  RV   9000  4.8 6.9 5.1 3.65 cm  Trace AI 0/5 trivial to none trace Midline  Normal function  8800  4.7 6.9 4.8 3.85 cm  0/5  Trivial TR Midline sl left at base   8600  4.9 6.4 4.7 4.0 cm  Slight openingswith each beat         Ramp ECHO performed at bedside with Dr. Haroldine Laws and [name].    At completion of ramp study, patients primary and back up controller programmed:  Fixed speed: 8800 Low speed limit: 8200  Inflow cannula angulated slightly to septum. If PI's still continue on current settings will consider dropping to 8600.

## 2015-01-26 NOTE — Telephone Encounter (Signed)
Called to see if patient was coming for [name]'s RAMP echo today since they are over 15 min late. LVM requesting callback.

## 2015-01-27 ENCOUNTER — Ambulatory Visit (HOSPITAL_COMMUNITY): Payer: Self-pay | Admitting: Infectious Diseases

## 2015-01-27 LAB — POCT INR: INR: 2.6

## 2015-01-27 NOTE — Addendum Note (Signed)
Encounter addended by: Ulmer Callas, RN on: 01/27/2015  9:03 AM<BR>     Documentation filed: Notes Section

## 2015-01-28 ENCOUNTER — Encounter (HOSPITAL_COMMUNITY): Payer: Self-pay | Admitting: Emergency Medicine

## 2015-01-28 ENCOUNTER — Inpatient Hospital Stay (HOSPITAL_COMMUNITY): Payer: Medicare Other

## 2015-01-28 ENCOUNTER — Inpatient Hospital Stay (HOSPITAL_COMMUNITY)
Admission: EM | Admit: 2015-01-28 | Discharge: 2015-02-16 | DRG: 545 | Disposition: A | Discharge status: Home / Self Care | Payer: Medicare Other | Attending: Internal Medicine | Admitting: Internal Medicine

## 2015-01-28 ENCOUNTER — Ambulatory Visit (HOSPITAL_COMMUNITY): Payer: Medicare Other

## 2015-01-28 ENCOUNTER — Telehealth (HOSPITAL_COMMUNITY): Payer: Self-pay | Admitting: Infectious Diseases

## 2015-01-28 ENCOUNTER — Emergency Department (HOSPITAL_COMMUNITY): Payer: Medicare Other

## 2015-01-28 DIAGNOSIS — I427 Cardiomyopathy due to drug and external agent: Secondary | ICD-10-CM | POA: Diagnosis present

## 2015-01-28 DIAGNOSIS — E871 Hypo-osmolality and hyponatremia: Secondary | ICD-10-CM | POA: Diagnosis present

## 2015-01-28 DIAGNOSIS — Z79899 Other long term (current) drug therapy: Secondary | ICD-10-CM

## 2015-01-28 DIAGNOSIS — Z8571 Personal history of Hodgkin lymphoma: Secondary | ICD-10-CM | POA: Diagnosis not present

## 2015-01-28 DIAGNOSIS — E43 Unspecified severe protein-calorie malnutrition: Secondary | ICD-10-CM | POA: Diagnosis present

## 2015-01-28 DIAGNOSIS — M25461 Effusion, right knee: Secondary | ICD-10-CM | POA: Diagnosis present

## 2015-01-28 DIAGNOSIS — W1830XA Fall on same level, unspecified, initial encounter: Secondary | ICD-10-CM | POA: Diagnosis present

## 2015-01-28 DIAGNOSIS — M25552 Pain in left hip: Secondary | ICD-10-CM | POA: Diagnosis present

## 2015-01-28 DIAGNOSIS — R531 Weakness: Secondary | ICD-10-CM

## 2015-01-28 DIAGNOSIS — Z888 Allergy status to other drugs, medicaments and biological substances status: Secondary | ICD-10-CM | POA: Diagnosis not present

## 2015-01-28 DIAGNOSIS — M25469 Effusion, unspecified knee: Secondary | ICD-10-CM | POA: Insufficient documentation

## 2015-01-28 DIAGNOSIS — M25569 Pain in unspecified knee: Secondary | ICD-10-CM

## 2015-01-28 DIAGNOSIS — R51 Headache: Secondary | ICD-10-CM | POA: Diagnosis not present

## 2015-01-28 DIAGNOSIS — M061 Adult-onset Still's disease: Principal | ICD-10-CM | POA: Diagnosis present

## 2015-01-28 DIAGNOSIS — I69351 Hemiplegia and hemiparesis following cerebral infarction affecting right dominant side: Secondary | ICD-10-CM | POA: Diagnosis not present

## 2015-01-28 DIAGNOSIS — Z95811 Presence of heart assist device: Secondary | ICD-10-CM

## 2015-01-28 DIAGNOSIS — M25561 Pain in right knee: Secondary | ICD-10-CM | POA: Diagnosis not present

## 2015-01-28 DIAGNOSIS — Z794 Long term (current) use of insulin: Secondary | ICD-10-CM | POA: Diagnosis not present

## 2015-01-28 DIAGNOSIS — Z95818 Presence of other cardiac implants and grafts: Secondary | ICD-10-CM | POA: Diagnosis not present

## 2015-01-28 DIAGNOSIS — Z7901 Long term (current) use of anticoagulants: Secondary | ICD-10-CM | POA: Diagnosis not present

## 2015-01-28 DIAGNOSIS — I480 Paroxysmal atrial fibrillation: Secondary | ICD-10-CM | POA: Diagnosis present

## 2015-01-28 DIAGNOSIS — M25559 Pain in unspecified hip: Secondary | ICD-10-CM | POA: Diagnosis not present

## 2015-01-28 DIAGNOSIS — M79671 Pain in right foot: Secondary | ICD-10-CM

## 2015-01-28 DIAGNOSIS — R509 Fever, unspecified: Secondary | ICD-10-CM | POA: Diagnosis present

## 2015-01-28 DIAGNOSIS — N280 Ischemia and infarction of kidney: Secondary | ICD-10-CM | POA: Diagnosis present

## 2015-01-28 DIAGNOSIS — M0820 Juvenile rheumatoid arthritis with systemic onset, unspecified site: Secondary | ICD-10-CM | POA: Insufficient documentation

## 2015-01-28 DIAGNOSIS — R627 Adult failure to thrive: Secondary | ICD-10-CM | POA: Diagnosis present

## 2015-01-28 DIAGNOSIS — Z6821 Body mass index (BMI) 21.0-21.9, adult: Secondary | ICD-10-CM | POA: Diagnosis not present

## 2015-01-28 DIAGNOSIS — E1165 Type 2 diabetes mellitus with hyperglycemia: Secondary | ICD-10-CM | POA: Diagnosis present

## 2015-01-28 DIAGNOSIS — D72829 Elevated white blood cell count, unspecified: Secondary | ICD-10-CM | POA: Insufficient documentation

## 2015-01-28 DIAGNOSIS — R739 Hyperglycemia, unspecified: Secondary | ICD-10-CM

## 2015-01-28 DIAGNOSIS — Z95810 Presence of automatic (implantable) cardiac defibrillator: Secondary | ICD-10-CM

## 2015-01-28 DIAGNOSIS — I10 Essential (primary) hypertension: Secondary | ICD-10-CM | POA: Diagnosis present

## 2015-01-28 DIAGNOSIS — R519 Headache, unspecified: Secondary | ICD-10-CM

## 2015-01-28 DIAGNOSIS — R634 Abnormal weight loss: Secondary | ICD-10-CM | POA: Diagnosis present

## 2015-01-28 DIAGNOSIS — T451X5A Adverse effect of antineoplastic and immunosuppressive drugs, initial encounter: Secondary | ICD-10-CM | POA: Diagnosis present

## 2015-01-28 DIAGNOSIS — I5022 Chronic systolic (congestive) heart failure: Secondary | ICD-10-CM | POA: Diagnosis present

## 2015-01-28 DIAGNOSIS — R6251 Failure to thrive (child): Secondary | ICD-10-CM

## 2015-01-28 DIAGNOSIS — Z9114 Patient's other noncompliance with medication regimen: Secondary | ICD-10-CM | POA: Diagnosis present

## 2015-01-28 DIAGNOSIS — W19XXXA Unspecified fall, initial encounter: Secondary | ICD-10-CM | POA: Diagnosis present

## 2015-01-28 DIAGNOSIS — M25562 Pain in left knee: Secondary | ICD-10-CM | POA: Diagnosis not present

## 2015-01-28 DIAGNOSIS — E785 Hyperlipidemia, unspecified: Secondary | ICD-10-CM | POA: Diagnosis present

## 2015-01-28 DIAGNOSIS — Z8572 Personal history of non-Hodgkin lymphomas: Secondary | ICD-10-CM

## 2015-01-28 DIAGNOSIS — D649 Anemia, unspecified: Secondary | ICD-10-CM | POA: Diagnosis present

## 2015-01-28 DIAGNOSIS — Z86718 Personal history of other venous thrombosis and embolism: Secondary | ICD-10-CM | POA: Diagnosis not present

## 2015-01-28 DIAGNOSIS — M549 Dorsalgia, unspecified: Secondary | ICD-10-CM | POA: Diagnosis not present

## 2015-01-28 DIAGNOSIS — Y92003 Bedroom of unspecified non-institutional (private) residence as the place of occurrence of the external cause: Secondary | ICD-10-CM | POA: Diagnosis not present

## 2015-01-28 DIAGNOSIS — F329 Major depressive disorder, single episode, unspecified: Secondary | ICD-10-CM | POA: Diagnosis present

## 2015-01-28 DIAGNOSIS — M79673 Pain in unspecified foot: Secondary | ICD-10-CM | POA: Diagnosis not present

## 2015-01-28 DIAGNOSIS — M7989 Other specified soft tissue disorders: Secondary | ICD-10-CM | POA: Diagnosis not present

## 2015-01-28 DIAGNOSIS — M2550 Pain in unspecified joint: Secondary | ICD-10-CM | POA: Diagnosis not present

## 2015-01-28 DIAGNOSIS — N2889 Other specified disorders of kidney and ureter: Secondary | ICD-10-CM | POA: Insufficient documentation

## 2015-01-28 DIAGNOSIS — R945 Abnormal results of liver function studies: Secondary | ICD-10-CM | POA: Diagnosis not present

## 2015-01-28 DIAGNOSIS — Z8579 Personal history of other malignant neoplasms of lymphoid, hematopoietic and related tissues: Secondary | ICD-10-CM | POA: Diagnosis not present

## 2015-01-28 HISTORY — DX: Disorder of kidney and ureter, unspecified: N28.9

## 2015-01-28 LAB — CBC
HEMATOCRIT: 33.1 % — AB (ref 39.0–52.0)
HEMOGLOBIN: 11.2 g/dL — AB (ref 13.0–17.0)
MCH: 28.7 pg (ref 26.0–34.0)
MCHC: 33.8 g/dL (ref 30.0–36.0)
MCV: 84.9 fL (ref 78.0–100.0)
Platelets: 256 10*3/uL (ref 150–400)
RBC: 3.9 MIL/uL — ABNORMAL LOW (ref 4.22–5.81)
RDW: 14 % (ref 11.5–15.5)
WBC: 12.3 10*3/uL — ABNORMAL HIGH (ref 4.0–10.5)

## 2015-01-28 LAB — BASIC METABOLIC PANEL
Anion gap: 11 (ref 5–15)
BUN: 19 mg/dL (ref 6–20)
CO2: 23 mmol/L (ref 22–32)
CREATININE: 1.09 mg/dL (ref 0.61–1.24)
Calcium: 9.1 mg/dL (ref 8.9–10.3)
Chloride: 94 mmol/L — ABNORMAL LOW (ref 101–111)
GFR calc Af Amer: >60 mL/min (ref >60)
GFR calc non Af Amer: >60 mL/min (ref >60)
Glucose, Bld: 214 mg/dL — ABNORMAL HIGH (ref 65–99)
Potassium: 4 mmol/L (ref 3.5–5.1)
SODIUM: 128 mmol/L — AB (ref 135–145)

## 2015-01-28 LAB — PROTIME-INR
INR: 2.48 — ABNORMAL HIGH (ref 0.00–1.49)
Prothrombin Time: 26.5 seconds — ABNORMAL HIGH (ref 11.6–15.2)

## 2015-01-28 LAB — LACTATE DEHYDROGENASE: LDH: 446 U/L — AB (ref 98–192)

## 2015-01-28 MED ORDER — ATORVASTATIN CALCIUM 20 MG PO TABS
20.0000 mg | ORAL_TABLET | Freq: Every day | ORAL | Status: DC
  Administered 2015-01-28 – 2015-02-15 (×19): 20 mg via ORAL
  Filled 2015-01-28 (×20): qty 1

## 2015-01-28 MED ORDER — INSULIN GLARGINE 100 UNIT/ML ~~LOC~~ SOLN
5.0000 [IU] | Freq: Every day | SUBCUTANEOUS | Status: DC
  Administered 2015-01-28 – 2015-02-15 (×17): 5 [IU] via SUBCUTANEOUS
  Filled 2015-01-28 (×20): qty 0.05

## 2015-01-28 MED ORDER — AMLODIPINE BESYLATE 5 MG PO TABS
5.0000 mg | ORAL_TABLET | Freq: Every day | ORAL | Status: DC
  Administered 2015-01-28: 5 mg via ORAL
  Filled 2015-01-28 (×2): qty 1

## 2015-01-28 MED ORDER — ACETAMINOPHEN ER 650 MG PO TBCR: 650.0000 mg | EXTENDED_RELEASE_TABLET | Freq: Three times a day (TID) | ORAL | Status: DC | PRN

## 2015-01-28 MED ORDER — SODIUM CHLORIDE 0.9 % IV SOLN
Freq: Once | INTRAVENOUS | Status: AC
  Administered 2015-01-28: 20:00:00 via INTRAVENOUS

## 2015-01-28 MED ORDER — ALBUTEROL SULFATE HFA 108 (90 BASE) MCG/ACT IN AERS: 1.0000 | INHALATION_SPRAY | Freq: Four times a day (QID) | RESPIRATORY_TRACT | Status: DC | PRN

## 2015-01-28 MED ORDER — PANTOPRAZOLE SODIUM 40 MG PO TBEC
40.0000 mg | DELAYED_RELEASE_TABLET | Freq: Every day | ORAL | Status: DC
  Administered 2015-01-28 – 2015-02-16 (×20): 40 mg via ORAL
  Filled 2015-01-28 (×21): qty 1

## 2015-01-28 MED ORDER — ENSURE NUTRITION SHAKE PO LIQD: 1.0000 | Freq: Two times a day (BID) | ORAL | Status: DC

## 2015-01-28 MED ORDER — ACETAMINOPHEN 325 MG PO TABS
650.0000 mg | ORAL_TABLET | Freq: Three times a day (TID) | ORAL | Status: DC | PRN
  Administered 2015-01-30 – 2015-02-09 (×9): 650 mg via ORAL
  Filled 2015-01-28 (×9): qty 2

## 2015-01-28 MED ORDER — METHOCARBAMOL 500 MG PO TABS
500.0000 mg | ORAL_TABLET | Freq: Four times a day (QID) | ORAL | Status: DC
  Administered 2015-01-28 – 2015-02-16 (×67): 500 mg via ORAL
  Filled 2015-01-28 (×81): qty 1

## 2015-01-28 MED ORDER — MAGNESIUM 400 MG PO TABS: 1.0000 | ORAL_TABLET | Freq: Every day | ORAL | Status: DC

## 2015-01-28 MED ORDER — AMIODARONE HCL 200 MG PO TABS
200.0000 mg | ORAL_TABLET | Freq: Every day | ORAL | Status: DC
  Administered 2015-01-28 – 2015-02-16 (×20): 200 mg via ORAL
  Filled 2015-01-28 (×20): qty 1

## 2015-01-28 MED ORDER — ALBUTEROL SULFATE (2.5 MG/3ML) 0.083% IN NEBU: 2.5000 mg | INHALATION_SOLUTION | Freq: Four times a day (QID) | RESPIRATORY_TRACT | Status: DC | PRN

## 2015-01-28 MED ORDER — WARFARIN - PHYSICIAN DOSING INPATIENT: Freq: Every day | Status: DC

## 2015-01-28 MED ORDER — SODIUM CHLORIDE 0.9 % IV BOLUS (SEPSIS)
500.0000 mL | Freq: Once | INTRAVENOUS | Status: AC
  Administered 2015-01-28: 500 mL via INTRAVENOUS

## 2015-01-28 MED ORDER — ENSURE ENLIVE PO LIQD
237.0000 mL | Freq: Two times a day (BID) | ORAL | Status: DC
  Administered 2015-01-29 (×2): 237 mL via ORAL

## 2015-01-28 MED ORDER — BISACODYL 5 MG PO TBEC: 5.0000 mg | DELAYED_RELEASE_TABLET | Freq: Every day | ORAL | Status: DC | PRN

## 2015-01-28 MED ORDER — WARFARIN SODIUM 2.5 MG PO TABS
2.5000 mg | ORAL_TABLET | ORAL | Status: DC
  Administered 2015-01-28: 2.5 mg via ORAL
  Filled 2015-01-28 (×2): qty 1

## 2015-01-28 MED ORDER — MAGNESIUM OXIDE 400 (241.3 MG) MG PO TABS
400.0000 mg | ORAL_TABLET | Freq: Every day | ORAL | Status: DC
  Administered 2015-01-29 – 2015-02-16 (×19): 400 mg via ORAL
  Filled 2015-01-28 (×19): qty 1

## 2015-01-28 MED ORDER — WARFARIN SODIUM 2.5 MG PO TABS: 5.0000 mg | ORAL_TABLET | ORAL | Status: DC

## 2015-01-28 NOTE — ED Notes (Signed)
MD at bedside. 

## 2015-01-28 NOTE — Progress Notes (Addendum)
LVAD ED Note:   Patient brought to ER by wife today after having had sustained another fall at home. States that he knows why he fell yesterday afternoon (missed the bed while trying to sit) however the most recent fall (today at 0300) he does not understand why he fell. He recalls falling while he tried to get from den to bathroom and tried to grab on to the VAD equipment cart for support but fell with cart on top of him. Denies having stumbled or tripped over anything. Complaining of pain 3/10 at this time. Denies having hit his head. Denies dizziness. Denies BRBPR or change in urine color (amber yellow). Increased weight loss recently (~40lbs since January) so feeling weakened. Prealbumin low at 12 with having recently started Ensure supplements this week.   Vital Signs: Temp: afebrile (reportedly)  HR: 86 BP: 88/71 (75) POx: 96%   LVAD Interrogation: Flow: 5.2  Speed: 8800 PI: 7 Power: 4.8 Alarms: some low voltage advisories Events: 7 PI events today, 18 yesterday, 22 before that   D/W Darrick Grinder and ED MD. Planning on having SJM device interrogated to evaluate for possible arrhythmias that may have caused fall this morning. Lab work being repeated today d/t some abnormalities on previous chemistry panel and to evaluate for possible anemia as causative factor. X-Ray of left hip currently pending to assess for possible break or injury.   LVAD appears to be functioning normally, however PI events still 15 - 20/day since speed decrease.

## 2015-01-28 NOTE — ED Notes (Signed)
Have attempted to interrogate st judes pacemaker multiple times with no success, paged st judes.

## 2015-01-28 NOTE — ED Notes (Signed)
IV team unable to gain IV access 

## 2015-01-28 NOTE — ED Notes (Signed)
Pt fell yesterday while trying to sit down. Pt fell again last night walking into bedroom. Pt's left hip hurting from fall. Pt here to be evaluated for fall

## 2015-01-28 NOTE — ED Provider Notes (Signed)
CSN: [id number]     Arrival date & time 01/28/15  1250 History   First MD Initiated Contact with Patient 01/28/15 1303     Chief Complaint  Patient presents with  . Fall   Patient is a 54 y.o. male presenting with fall. The history is provided by the patient.  Fall This is a new problem. The current episode started 6 to 12 hours ago. The problem occurs constantly. The problem has not changed since onset.Pertinent negatives include no chest pain, no abdominal pain, no headaches and no shortness of breath. The symptoms are aggravated by walking. The symptoms are relieved by rest.  Pt presents for fall He reports he fell at home last night Denies LOC but is unsure who he fell No head injury No HA/neck pain/back pain No cp/sob He has LVAD in place He reports pain in left hip  Past Medical History  Diagnosis Date  . Paroxysmal ventricular tachycardia 2005, 2010    s/p AICD '05, replaced w/ St. Jude's in 2010 (PVT/V.Fib arrest requiring ICD implant in 2005)  . HYPERTENSION, UNSPECIFIED   . HYPERLIPIDEMIA-MIXED   . 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 Nonischemic cardiomyopathy     2/2 Adriamycin administration for lymphoma  . CHF (congestive heart failure)     EF 15% by echo 2009; s/p St. Jude ICD  . Diabetes mellitus     Type 2  . Cancer 1992, 2007     non hodkins lymphoma 1992, Hodgkins 2007  . Depression   . LV (left ventricular) mural thrombus 2009    2009, on coumadin  . Small bowel obstruction 2001    s/p Small bowel resection 2001  . Jejunal intussusception 2008    2008  . 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 Splenic mass 2009    Noted on Abd [place] 2009 w/ recs for f/u CT - not done  . Hypotension   . Noncompliance with medication regimen   . AICD (automatic cardioverter/defibrillator) present 2005, 2010  . Presence of  permanent cardiac pacemaker    Past Surgical History  Procedure Laterality Date  . Cardiac defibrillator placement      2005, replaced w/ St. Jude's 2010  . Vasectomy    . Bowel resection      small bowell 2/2 obstruction 2001  . Pacemaker insertion    . Insert / replace / remove pacemaker    . Colonoscopy N/A 09/18/2014    Procedure: COLONOSCOPY;  Surgeon: [name], MD;  Location: [facility] ENDOSCOPY;  Service: Endoscopy;  Laterality: N/A;  . Esophagogastroduodenoscopy N/A 09/18/2014    Procedure: ESOPHAGOGASTRODUODENOSCOPY (EGD);  Surgeon: [name], MD;  Location: [facility] ENDOSCOPY;  Service: Endoscopy;  Laterality: N/A;  . Left and right heart catheterization with coronary angiogram N/A 09/19/2014    Procedure: LEFT AND RIGHT HEART CATHETERIZATION WITH CORONARY ANGIOGRAM;  Surgeon: Jolaine Artist, MD;  Location: [facility] CATH LAB;  Service: Cardiovascular;  Laterality: N/A;  . Colonoscopy N/A 09/19/2014    Procedure: COLONOSCOPY;  Surgeon: [name], MD;  Location: [place];  Service: Gastroenterology;  Laterality: N/A;  . Insertion of implantable left ventricular assist device N/A 10/13/2014    Procedure: INSERTION OF IMPLANTABLE LEFT VENTRICULAR ASSIST DEVICE;  Surgeon: [name], MD;  Location: [place];  Service: Open Heart Surgery;  Laterality:  N/A;  CIRC ARREST  NITRIC OXIDE  . Tee without cardioversion N/A 10/13/2014    Procedure: TRANSESOPHAGEAL ECHOCARDIOGRAM (TEE);  Surgeon: [name], MD;  [facility];  Service: Open Heart Surgery;  Laterality: N/A;  . Exploration post operative open heart N/A 10/13/2014    Procedure: EXPLORATION POST OPERATIVE OPEN HEART;  Surgeon: [name], MD;  Location: [place] OR;  Service: Open Heart Surgery;  Laterality: N/A;   Family History  Problem Relation Age of Onset  . Other      No known family h/o heart disease  . Heart disease    . Diabetes Mother   . Other Other     complications from hip replacement   History  Substance Use  Topics  . Smoking status: Never Smoker   . Smokeless tobacco: Never Used  . Alcohol Use: 1.2 oz/week    1 Cans of beer, 1 Shots of liquor per week     Comment: ON SPECIAL OCCASIONS    Review of Systems  Respiratory: Negative for shortness of breath.   Cardiovascular: Negative for chest pain.  Gastrointestinal: Negative for abdominal pain.  Musculoskeletal: Positive for arthralgias. Negative for back pain and neck pain.  Neurological: Negative for syncope and headaches.  All other systems reviewed and are negative.     Allergies  Hydralazine  Home Medications   Prior to Admission medications   Medication Sig Start Date End Date Taking? Authorizing Provider  acetaminophen (TYLENOL) 650 MG CR tablet Take 1 tablet (650 mg total) by mouth every 8 (eight) hours as needed for pain. 12/19/14   Alexa Sherral Hammers, MD  albuterol (PROVENTIL HFA;VENTOLIN HFA) 108 (90 BASE) MCG/ACT inhaler Inhale 1-2 puffs into the lungs every 6 (six) hours as needed for wheezing or shortness of breath.    Historical Provider, MD  amiodarone (PACERONE) 200 MG tablet Take 1 tablet (200 mg total) by mouth daily. 10/24/14   Amy D Clegg, NP  amLODipine (NORVASC) 5 MG tablet Take 1 tablet (5 mg total) by mouth daily. 01/13/15   Amy D Ninfa Meeker, NP  atorvastatin (LIPITOR) 40 MG tablet Take 0.5 tablets (20 mg total) by mouth at bedtime. 09/04/14   Larey Dresser, MD  bisacodyl (DULCOLAX) 5 MG EC tablet Take 1 tablet (5 mg total) by mouth daily as needed for moderate constipation. 09/04/14   Larey Dresser, MD  insulin glargine (LANTUS) 100 UNIT/ML injection Inject 0.05 mLs (5 Units total) into the skin at bedtime. 10/24/14   Amy D Ninfa Meeker, NP  Magnesium 400 MG TABS Take 1 tablet by mouth daily with breakfast. 10/30/14   Amy D Clegg, NP  methocarbamol (ROBAXIN) 500 MG tablet Take 1 tablet (500 mg total) by mouth every 6 (six) hours. 01/13/15   Amy D Ninfa Meeker, NP  Nutritional Supplements (ENSURE NUTRITION SHAKE) LIQD Take 1 Bottle by  mouth 2 (two) times daily at 10 AM and 5 PM. 01/26/15   Jolaine Artist, MD  pantoprazole (PROTONIX) 40 MG tablet Take 1 tablet (40 mg total) by mouth daily. 10/24/14   Amy D Ninfa Meeker, NP  warfarin (COUMADIN) 5 MG tablet Take 0.5-1 tablets (2.5-5 mg total) by mouth daily. Take 5 mg 01/13/15  then 2.5 mg every day except 5 mg Monday and Friday 01/13/15   Amy D Clegg, NP   BP 94/70 mmHg  Pulse 89  Temp(Src) 98.4 F (36.9 C) (Oral)  Resp 16  Ht 5\' 7"  (1.702 m)  Wt 134 lb (60.782 kg)  BMI 20.98 kg/m2  SpO2 93% Physical Exam CONSTITUTIONAL: Well developed/well nourished HEAD: Normocephalic/atraumatic EYES: EOMI/PERRL ENMT: Mucous membranes moist NECK: supple no meningeal signs SPINE/BACK:entire spine nontender CV: LVAD hum noted LUNGS: Lungs are clear to auscultation bilaterally, no apparent distress ABDOMEN: soft, nontender,lvad noted.   GU:no cva tenderness NEURO: Pt is awake/alert/appropriate, moves all extremitiesx4.   EXTREMITIES: pulses normal/equal, full ROM. Tender to palpation of left ASIS No hip tenderness.  All other extremities/joints palpated/ranged and nontender SKIN: warm, color normal PSYCH: no abnormalities of mood noted, alert and oriented to situation  ED Course  Procedures   1:44 PM Pt with LVAD in place LVAD team has seen patient Will image pelvis 2:48 PM LVAD team saw patient Request labs and ICD interrogation Pt stable at this time 3:46 PM Seen by cardiology team Will admit to hospital They request IV fluids  Labs Review Labs Reviewed  CBC - Abnormal; Notable for the following:    WBC 12.3 (*)    RBC 3.90 (*)    Hemoglobin 11.2 (*)    HCT 33.1 (*)    All other components within normal limits  BASIC METABOLIC PANEL - Abnormal; Notable for the following:    Sodium 128 (*)    Chloride 94 (*)    Glucose, Bld 214 (*)    All other components within normal limits  PROTIME-INR - Abnormal; Notable for the following:    Prothrombin Time 26.5 (*)    INR  2.48 (*)    All other components within normal limits  LACTATE DEHYDROGENASE - Abnormal; Notable for the following:    LDH 446 (*)    All other components within normal limits    Imaging Review Dg Pelvis 1-2 Views  01/28/2015   CLINICAL DATA:  Fall  EXAM: PELVIS - 1-2 VIEW  COMPARISON:  CT 06/20/2014  FINDINGS: Bony pelvic ring appears intact. No acute fracture line identified. Bilateral hips projects normally over the acetabula. Mild degenerative changes at the hips.  Surgical clips within the anatomic pelvis  Unremarkable appearance of the proximal femurs.  IMPRESSION: Negative for acute bony abnormality.  Signed,  Dulcy Fanny. Earleen Newport, DO  [facility]  [facility] Radiology   Electronically Signed   By: Corrie Mckusick D.O.   On: 01/28/2015 13:53      MDM   Final diagnoses:  Fall, initial encounter  Leukocytosis  Hyperglycemia    Nursing notes including past medical history and social history reviewed and considered in documentation Labs/vital reviewed myself and considered during evaluation xrays/imaging reviewed by myself and considered during evaluation      Ripley Fraise, MD 01/28/15 1547

## 2015-01-28 NOTE — Telephone Encounter (Signed)
Called by [name] re: Andre Tran. She reports that he has fallen several times recently at home--twice yesterday and once a short while ago. Reports that he landed hard on his left hip, has pain and struggled to get up. She reports that he has been feeling very weak, has no energy and fell d/t what she assumes to be dizziness. He is diabetic and has had known low blood sugars in the past early in the morning even since glipizide has been D/C'd. She has not checked a blood sugar today. In recent clinic visit he reported having had fevers up to 101.5, of which she reports to have subsided since that appointment this week. She is bringing him to [facility] ER for evaluation and x-rays of hip d/t pain. Amy and Dr. Haroldine Laws made aware as well as ER Agricultural consultant. Requested to page when he arrives.

## 2015-01-28 NOTE — ED Notes (Signed)
St. Judes rep at bedside.

## 2015-01-28 NOTE — ED Notes (Signed)
Paged LVAD team to help transport patient to CT then to [place].

## 2015-01-28 NOTE — ED Notes (Signed)
IV team at bedside 

## 2015-01-28 NOTE — Progress Notes (Signed)
ANTICOAGULATION CONSULT NOTE - Initial Consult  Pharmacy Consult for Coumadin Indication: LVAD  Allergies  Allergen Reactions  . Hydralazine Other (See Comments)    Severe headaches     Patient Measurements: Height: 5\' 7"  (170.2 cm) Weight: 134 lb (60.782 kg) IBW/kg (Calculated) : 66.1 Heparin Dosing Weight:    Vital Signs: Temp: 98.4 F (36.9 C) (07/20 1300) Temp Source: Oral (07/20 1300) BP: 107/82 mmHg (07/20 1845) Pulse Rate: 81 (07/20 1815)  Labs:  Recent Labs  01/26/15 1045 01/27/15 01/28/15 1417  HGB 12.4*  --  11.2*  HCT 36.3*  --  33.1*  PLT 304  --  256  LABPROT 25.4*  --  26.5*  INR 2.34* 2.6 2.48*  CREATININE 1.36*  --  1.09    Estimated Creatinine Clearance: 69 mL/min (by C-G formula based on Cr of 1.09).   Medical History: Past Medical History  Diagnosis Date  . Paroxysmal ventricular tachycardia 2005, 2010    s/p AICD '05, replaced w/ St. Jude's in 2010 (PVT/V.Fib arrest requiring ICD implant in 2005)  . HYPERTENSION, UNSPECIFIED   . HYPERLIPIDEMIA-MIXED   . 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 Nonischemic cardiomyopathy     2/2 Adriamycin administration for lymphoma  . CHF (congestive heart failure)     EF 15% by echo 2009; s/p St. Jude ICD  . Diabetes mellitus     Type 2  . Cancer 1992, 2007     non hodkins lymphoma 1992, Hodgkins 2007  . Depression   . LV (left ventricular) mural thrombus 2009    2009, on coumadin  . Small bowel obstruction 2001    s/p Small bowel resection 2001  . Jejunal intussusception 2008    2008  . 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 Splenic mass 2009    Noted on Abd [place] 2009 w/ recs for f/u CT - not done  . Hypotension   . Noncompliance with medication regimen   . AICD (automatic cardioverter/defibrillator) present 2005, 2010  . Presence of permanent  cardiac pacemaker     Medications:  Prescriptions prior to admission  Medication Sig Dispense Refill Last Dose  . acetaminophen (TYLENOL) 650 MG CR tablet Take 1 tablet (650 mg total) by mouth every 8 (eight) hours as needed for pain. 14 tablet 0 Past Week at Unknown time  . amiodarone (PACERONE) 200 MG tablet Take 1 tablet (200 mg total) by mouth daily. 30 tablet 6 01/27/2015 at Unknown time  . amLODipine (NORVASC) 5 MG tablet Take 1 tablet (5 mg total) by mouth daily. 30 tablet 6 01/27/2015 at Unknown time  . atorvastatin (LIPITOR) 40 MG tablet Take 0.5 tablets (20 mg total) by mouth at bedtime. (Patient taking differently: Take 20 mg by mouth daily. ) 30 tablet 6 01/27/2015 at Unknown time  . insulin glargine (LANTUS) 100 UNIT/ML injection Inject 0.05 mLs (5 Units total) into the skin at bedtime. 10 mL 11 01/27/2015 at Unknown time  . Magnesium 400 MG TABS Take 1 tablet by mouth daily with breakfast. 90 tablet 3 01/27/2015 at Unknown time  . Nutritional Supplements (ENSURE NUTRITION SHAKE) LIQD Take 1 Bottle by mouth 2 (two) times daily at 10 AM and 5 PM. 60 Bottle 11 01/28/2015 at Unknown time  . pantoprazole (PROTONIX) 40 MG tablet Take 1 tablet (40 mg total) by mouth  daily. 30 tablet 6 01/27/2015 at Unknown time  . albuterol (PROVENTIL HFA;VENTOLIN HFA) 108 (90 BASE) MCG/ACT inhaler Inhale 1-2 puffs into the lungs every 6 (six) hours as needed for wheezing or shortness of breath.   2 months  . bisacodyl (DULCOLAX) 5 MG EC tablet Take 1 tablet (5 mg total) by mouth daily as needed for moderate constipation. 30 tablet 0 2 weeks  . methocarbamol (ROBAXIN) 500 MG tablet Take 1 tablet (500 mg total) by mouth every 6 (six) hours. (Patient not taking: Reported on 01/28/2015) 60 tablet 6 Not Taking at Unknown time  . warfarin (COUMADIN) 5 MG tablet Take 0.5-1 tablets (2.5-5 mg total) by mouth daily. Take 5 mg 01/13/15  then 2.5 mg every day except 5 mg Monday and Friday 45 tablet 3 01/26/2015     Assessment: 54 y/o M presents from LVAD clinic with ongoing weight loss, intermittent fevers, and 2 falls over the last 24h (did not hit head).  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 He has h/o recurrent lymphoma (1993, 2007). 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   Anticoagulation: Coumadin PTA for h/o LV thrombus, chronic L iliac vein thrombus, LVAD. INR goal 2.0-2.5 with recent [place] 5/16.INR 2.48. Hgb 11.2.  Goal of Therapy:  INR 2-2.5 Monitor platelets by anticoagulation protocol: Yes   Plan:  Continue home Coumadin regimen 5mg  MF and 2.5mg  other days. Daily INR   Brytnee Bechler S. Alford Highland, PharmD, BCPS Clinical Staff Pharmacist Pager [phone]  Eilene Ghazi Stillinger 01/28/2015,8:16 PM

## 2015-01-28 NOTE — Consult Note (Signed)
VAD TEAM History & Consult /Physical Note   Reason for Consult: Fallx2  with LVAD    HPI:    Mr Schroepfer is a 54 y.o. 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 Underwent HMII LVAD placement 10/13/2014 . 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   Evaluated in LVAD clinic on Monday with LVAD speed cut back to 8800 after ramp echo. Has had ongoing weight loss over the last 6 months. He reported intermittent fevers at home. Over the last 24 hours has had falls. Last one at 0300 and hit L buttock. Having pain L buttock. Denies syncope/presyncope. Did not hit his head. Appetite poor. Taking all medications.   Blood Cultures on 7/18 with no growth to date    LVAD INTERROGATION:  HeartMate II LVAD:  Flow 4.4 liters/min, speed 8800, power 4.8, PI 7.4 11 PI events     Review of Systems: [y] = yes, [ ]  = no   General: Weight gain [ ] ; Weight loss [ ] ; Anorexia [ ] ; Fatigue [Y ]; Fever [ ] ; Chills [ ] ; Weakness [ Y]  Cardiac: Chest pain/pressure [ ] ; Resting SOB [ ] ; Exertional SOB [ ] ; Orthopnea [ ] ; Pedal Edema [ ] ; Palpitations [ ] ; Syncope [ ] ; Presyncope [ ] ; Paroxysmal nocturnal dyspnea[ ]   Pulmonary: Cough [ ] ; Wheezing[ ] ; Hemoptysis[ ] ; Sputum [ ] ; Snoring [ ]   GI: Vomiting[ ] ; Dysphagia[ ] ; Melena[ ] ; Hematochezia [ ] ; Heartburn[ ] ; Abdominal pain [ ] ; Constipation [ ] ; Diarrhea [ ] ; BRBPR [ ]   GU: Hematuria[ ] ; Dysuria [ ] ; Nocturia[ ]   Vascular: Pain in legs with walking [ ] ; Pain in feet with lying flat [ ] ; Non-healing sores [ ] ; Stroke [ Y]; TIA [ ] ; Slurred speech [ ] ;  Neuro: Headaches[ ] ; Vertigo[ ] ; Seizures[ ] ; Paresthesias[ ] ;Blurred vision [  ]; Diplopia [ ] ; Vision changes [ ]   Ortho/Skin: Arthritis [ ] ; Joint pain [ ] ; Muscle pain [ ] ; Joint swelling [ ] ; Back Pain [ ] ; Rash [ ]   Psych: Depression[ ] ; Anxiety[ ]   Heme: Bleeding problems [ ] ; Clotting disorders [ ] ; Anemia [ ]   Endocrine: Diabetes [ ] ; Thyroid dysfunction[ ]   Home Medications Prior to Admission medications   Medication Sig Start Date End Date Taking? Authorizing Provider  acetaminophen (TYLENOL) 650 MG CR tablet Take 1 tablet (650 mg total) by mouth every 8 (eight) hours as needed for pain. 12/19/14   Alexa Sherral Hammers, MD  albuterol (PROVENTIL HFA;VENTOLIN HFA) 108 (90 BASE) MCG/ACT inhaler Inhale 1-2 puffs into the lungs every 6 (six) hours as needed for wheezing or shortness of breath.    Historical Provider, MD  amiodarone (PACERONE) 200 MG tablet Take 1 tablet (200 mg total) by mouth daily. 10/24/14   Amy D Clegg, NP  amLODipine (NORVASC) 5 MG tablet Take 1 tablet (5 mg total) by mouth daily. 01/13/15   Amy D Ninfa Meeker, NP  atorvastatin (LIPITOR) 40 MG tablet Take 0.5 tablets (20 mg total) by mouth at bedtime. 09/04/14   Larey Dresser, MD  bisacodyl (DULCOLAX) 5 MG EC tablet Take 1 tablet (5 mg total) by mouth daily as  needed for moderate constipation. 09/04/14   Larey Dresser, MD  insulin glargine (LANTUS) 100 UNIT/ML injection Inject 0.05 mLs (5 Units total) into the skin at bedtime. 10/24/14   Amy D Ninfa Meeker, NP  Magnesium 400 MG TABS Take 1 tablet by mouth daily with breakfast. 10/30/14   Amy D Clegg, NP  methocarbamol (ROBAXIN) 500 MG tablet Take 1 tablet (500 mg total) by mouth every 6 (six) hours. 01/13/15   Amy D Ninfa Meeker, NP  Nutritional Supplements (ENSURE NUTRITION SHAKE) LIQD Take 1 Bottle by mouth 2 (two) times daily at 10 AM and 5 PM. 01/26/15   Jolaine Artist, MD  pantoprazole (PROTONIX) 40 MG tablet Take 1 tablet (40 mg total) by mouth daily. 10/24/14   Amy D Ninfa Meeker, NP  warfarin (COUMADIN) 5 MG tablet Take 0.5-1 tablets (2.5-5 mg total) by mouth daily. Take 5  mg 01/13/15  then 2.5 mg every day except 5 mg Monday and Friday 01/13/15   Conrad Shelburn, NP    Past Medical History: Past Medical History  Diagnosis Date  . Paroxysmal ventricular tachycardia 2005, 2010    s/p AICD '05, replaced w/ St. Jude's in 2010 (PVT/V.Fib arrest requiring ICD implant in 2005)  . HYPERTENSION, UNSPECIFIED   . HYPERLIPIDEMIA-MIXED   . 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 Nonischemic cardiomyopathy     2/2 Adriamycin administration for lymphoma  . CHF (congestive heart failure)     EF 15% by echo 2009; s/p St. Jude ICD  . Diabetes mellitus     Type 2  . Cancer 1992, 2007     non hodkins lymphoma 1992, Hodgkins 2007  . Depression   . LV (left ventricular) mural thrombus 2009    2009, on coumadin  . Small bowel obstruction 2001    s/p Small bowel resection 2001  . Jejunal intussusception 2008    2008  . 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 Splenic mass 2009    Noted on Abd [place] 2009 w/ recs for f/u CT - not done  . Hypotension   . Noncompliance with medication regimen   . AICD (automatic cardioverter/defibrillator) present 2005, 2010  . Presence of permanent cardiac pacemaker     Past Surgical History: Past Surgical History  Procedure Laterality Date  . Cardiac defibrillator placement      2005, replaced w/ St. Jude's 2010  . Vasectomy    . Bowel resection      small bowell 2/2 obstruction 2001  . Pacemaker insertion    . Insert / replace / remove pacemaker    . Colonoscopy N/A 09/18/2014    Procedure: COLONOSCOPY;  Surgeon: [name], MD;  Location: [facility] ENDOSCOPY;  Service: Endoscopy;  Laterality: N/A;  . Esophagogastroduodenoscopy N/A 09/18/2014    Procedure: ESOPHAGOGASTRODUODENOSCOPY (EGD);  Surgeon: [name], MD;  Location: [facility] ENDOSCOPY;  Service: Endoscopy;  Laterality: N/A;  . Left and right  heart catheterization with coronary angiogram N/A 09/19/2014    Procedure: LEFT AND RIGHT HEART CATHETERIZATION WITH CORONARY ANGIOGRAM;  Surgeon: Jolaine Artist, MD;  Location: [facility] CATH LAB;  Service: Cardiovascular;  Laterality: N/A;  . Colonoscopy N/A 09/19/2014    Procedure: COLONOSCOPY;  Surgeon: [name], MD;  Location: [place];  Service: Gastroenterology;  Laterality: N/A;  . Insertion of implantable left ventricular assist device  N/A 10/13/2014    Procedure: INSERTION OF IMPLANTABLE LEFT VENTRICULAR ASSIST DEVICE;  Surgeon: [name], MD;  Location: [place];  Service: Open Heart Surgery;  Laterality: N/A;  CIRC ARREST  NITRIC OXIDE  . Tee without cardioversion N/A 10/13/2014    Procedure: TRANSESOPHAGEAL ECHOCARDIOGRAM (TEE);  Surgeon: [name], MD;  Location: [place];  Service: Open Heart Surgery;  Laterality: N/A;  . Exploration post operative open heart N/A 10/13/2014    Procedure: EXPLORATION POST OPERATIVE OPEN HEART;  Surgeon: [name], MD;  Location: [place] OR;  Service: Open Heart Surgery;  Laterality: N/A;    Family History: Family History  Problem Relation Age of Onset  . Other      No known family h/o heart disease  . Heart disease    . Diabetes Mother   . Other Other     complications from hip replacement    Social History: History   Social History  . Marital Status: Married    Spouse Name: N/A  . Number of Children: N/A  . Years of Education: N/A   Social History Main Topics  . Smoking status: Never Smoker   . Smokeless tobacco: Never Used  . Alcohol Use: 1.2 oz/week    1 Cans of beer, 1 Shots of liquor per week     Comment: ON SPECIAL OCCASIONS  . Drug Use: No  . Sexual Activity: No   Other Topics Concern  . None   Social History Narrative    Allergies:  Allergies  Allergen Reactions  . Hydralazine Other (See Comments)    Severe headaches     Objective:    Vital Signs:   Temp:  [98.4 F (36.9 C)] 98.4 F (36.9 C) (07/20  1300) Pulse Rate:  [89] 89 (07/20 1300) Resp:  [16] 16 (07/20 1300) BP: (94)/(70) 94/70 mmHg (07/20 1300) SpO2:  [93 %] 93 % (07/20 1300) Weight:  [134 lb (60.782 kg)] 134 lb (60.782 kg) (07/20 1300)   Filed Weights   01/28/15 1300  Weight: 134 lb (60.782 kg)    Mean arterial Pressure 75  Physical Exam: General:  Well appearing. No resp difficulty Wife presen  HEENT: normal Neck: supple. JVP flat. Carotids 2+ bilat; no bruits. No lymphadenopathy or thryomegaly appreciated. Cor: Mechanical heart sounds with LVAD hum present. Lungs: clear Abdomen: soft, nontender, nondistended. No hepatosplenomegaly. No bruits or masses. Good bowel sounds. Driveline: C/D/I; securement device intact and driveline incorporated Extremities: no cyanosis, clubbing, rash, edema. No ecchymotic area noted   Neuro: alert & orientedx3, cranial nerves grossly intact. moves all 4 extremities w/o difficulty. Affect pleasant    Labs: Basic Metabolic Panel:  Recent Labs Lab 01/26/15 1045  NA 129*  K 4.0  CL 93*  CO2 24  GLUCOSE 151*  BUN 13  CREATININE 1.36*  CALCIUM 9.4  MG 2.0    Liver Function Tests:  Recent Labs Lab 01/26/15 1045  AST 81*  ALT 61  ALKPHOS 140*  BILITOT 1.0  PROT 8.2*  ALBUMIN 4.1   No results for input(s): LIPASE, AMYLASE in the last 168 hours. No results for input(s): AMMONIA in the last 168 hours.  CBC:  Recent Labs Lab 01/26/15 1045  WBC 9.2  HGB 12.4*  HCT 36.3*  MCV 85.2  PLT 304    Cardiac Enzymes: No results for input(s): CKTOTAL, CKMB, CKMBINDEX, TROPONINI in the last 168 hours.  BNP: BNP (last 3 results)  Recent Labs  11/05/14 1030 12/19/14 1115 01/26/15 1045  BNP 118.8* 82.3 63.0    ProBNP (last 3 results)  Recent Labs  06/12/14 1415 06/18/14 1200  PROBNP 2079.0* 2144.0*     CBG: No results for input(s): GLUCAP in the last 168 hours.  Coagulation Studies:  Recent Labs  01/26/15 1045 01/27/15  LABPROT 25.4*  --   INR  2.34* 2.6    Other results:   Imaging:  No results found.      Assessment:  1. Fall x2 in the last 24 hours.  No presyncope/-->landed on left hip. Xray now. Ask ST Jude to interogate device. Check CBC now   2. Chronic systolic HF - Status post LVAD implantation for DT.  - Speed turned down to 8800. (Did not want to go lower due to previous embolic event.) - Volume status ok off diuretics. 2. Anticoagulation management - INR goal 2.0-2.5 with recent ICH. Off ASA. Will evaluate INR value today  3. H/o Non Hodgkins Lymphoma s/p therapy 4.  H/O CVA 2008  5. H/O VT/VF --St Jude ICD 2010 - Interrogate device  6. DMII 7. 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  Interrogate device  8. Recent ICH 5/16.  -stable no new neuro deficits 9. Poor appetite/weight loss -  No clear cause. Starting ensure. .  10. Fever 11. Failure to Thrive.   Admit for additional testing. WBC trending up. XRay ok.  Check CBC, BMET, LDH, INR    I reviewed the LVAD parameters from today, and compared the results to the patient's prior recorded data.  No programming changes were made.  The LVAD is functioning within specified parameters.  The patient performs LVAD self-test daily.  LVAD interrogation was negative for any significant power changes, alarms or PI events/speed drops.  LVAD equipment check completed and is in good working order.  Back-up equipment present.   LVAD education done on emergency procedures and precautions and reviewed exit site care.  Length of Stay:   CLEGG,AMY NP-C  01/28/2015, 1:46 PM  VAD Team Pager [phone] (7am - 7am) +++VAD ISSUES ONLY+++   Advanced Heart Failure Team Pager [phone] (M-F; [place])  Please contact [facility] for night-coverage after hours (4p -7a ) and weekends on [email or web address] for all non- LVAD Issues  Patient seen and examined with Darrick Grinder, NP. We discussed all aspects of the encounter. I agree with the assessment and plan as stated above.    Continues with FTT and weakness of unclear etiology. Volume status low. Will admit for gentle hydration. Also has low-grade fevers without clear source. Bcx remain negative. Will proceed with CT to exclude recurrent lymphoma. LDH ok. VAD parameters stable.   Alisabeth Selkirk,MD 10:50 PM

## 2015-01-28 NOTE — ED Notes (Signed)
LVAD team paged to assist transport pt to floor.

## 2015-01-28 NOTE — ED Notes (Signed)
Notified xray that pt is ready for transport now that LVAD nurse is here.

## 2015-01-29 LAB — COMPREHENSIVE METABOLIC PANEL
ALT: 63 U/L (ref 17–63)
ANION GAP: 7 (ref 5–15)
AST: 87 U/L — AB (ref 15–41)
Albumin: 3 g/dL — ABNORMAL LOW (ref 3.5–5.0)
Alkaline Phosphatase: 130 U/L — ABNORMAL HIGH (ref 38–126)
BUN: 11 mg/dL (ref 6–20)
CHLORIDE: 99 mmol/L — AB (ref 101–111)
CO2: 26 mmol/L (ref 22–32)
Calcium: 8.7 mg/dL — ABNORMAL LOW (ref 8.9–10.3)
Creatinine, Ser: 0.9 mg/dL (ref 0.61–1.24)
GFR calc Af Amer: >60 mL/min (ref >60)
GFR calc non Af Amer: >60 mL/min (ref >60)
GLUCOSE: 200 mg/dL — AB (ref 65–99)
POTASSIUM: 3.7 mmol/L (ref 3.5–5.1)
SODIUM: 132 mmol/L — AB (ref 135–145)
TOTAL PROTEIN: 6.8 g/dL (ref 6.5–8.1)
Total Bilirubin: 0.6 mg/dL (ref 0.3–1.2)

## 2015-01-29 LAB — CBC WITH DIFFERENTIAL/PLATELET
BASOS PCT: 0 % (ref 0–1)
Basophils Absolute: 0 10*3/uL (ref 0.0–0.1)
EOS ABS: 0 10*3/uL (ref 0.0–0.7)
EOS PCT: 0 % (ref 0–5)
HEMATOCRIT: 29.4 % — AB (ref 39.0–52.0)
HEMOGLOBIN: 9.9 g/dL — AB (ref 13.0–17.0)
Lymphocytes Relative: 12 % (ref 12–46)
Lymphs Abs: 1.4 10*3/uL (ref 0.7–4.0)
MCH: 28.4 pg (ref 26.0–34.0)
MCHC: 33.7 g/dL (ref 30.0–36.0)
MCV: 84.5 fL (ref 78.0–100.0)
MONO ABS: 1.3 10*3/uL — AB (ref 0.1–1.0)
MONOS PCT: 12 % (ref 3–12)
Neutro Abs: 8.2 10*3/uL — ABNORMAL HIGH (ref 1.7–7.7)
Neutrophils Relative %: 76 % (ref 43–77)
Platelets: 238 10*3/uL (ref 150–400)
RBC: 3.48 MIL/uL — AB (ref 4.22–5.81)
RDW: 13.9 % (ref 11.5–15.5)
WBC: 10.9 10*3/uL — ABNORMAL HIGH (ref 4.0–10.5)

## 2015-01-29 LAB — GLUCOSE, CAPILLARY
GLUCOSE-CAPILLARY: 232 mg/dL — AB (ref 65–99)
GLUCOSE-CAPILLARY: 182 mg/dL — AB (ref 65–99)
Glucose-Capillary: 180 mg/dL — ABNORMAL HIGH (ref 65–99)

## 2015-01-29 LAB — URINALYSIS, ROUTINE W REFLEX MICROSCOPIC
Glucose, UA: 100 mg/dL — AB
Ketones, ur: NEGATIVE mg/dL
Leukocytes, UA: NEGATIVE
Nitrite: NEGATIVE
Protein, ur: 100 mg/dL — AB
Specific Gravity, Urine: 1.025 (ref 1.005–1.030)
Urobilinogen, UA: 1 mg/dL (ref 0.0–1.0)
pH: 6 (ref 5.0–8.0)

## 2015-01-29 LAB — URINE MICROSCOPIC-ADD ON

## 2015-01-29 LAB — PROTIME-INR
INR: 2.92 — ABNORMAL HIGH (ref 0.00–1.49)
PROTHROMBIN TIME: 30 s — AB (ref 11.6–15.2)

## 2015-01-29 LAB — LACTATE DEHYDROGENASE: LDH: 322 U/L — ABNORMAL HIGH (ref 98–192)

## 2015-01-29 MED ORDER — AMLODIPINE BESYLATE 2.5 MG PO TABS
2.5000 mg | ORAL_TABLET | Freq: Every day | ORAL | Status: DC
  Administered 2015-01-29 – 2015-02-16 (×19): 2.5 mg via ORAL
  Filled 2015-01-29 (×19): qty 1

## 2015-01-29 MED ORDER — MIRTAZAPINE 7.5 MG PO TABS
7.5000 mg | ORAL_TABLET | Freq: Every day | ORAL | Status: DC
  Administered 2015-01-29 – 2015-02-15 (×18): 7.5 mg via ORAL
  Filled 2015-01-29 (×19): qty 1

## 2015-01-29 MED ORDER — INSULIN ASPART 100 UNIT/ML ~~LOC~~ SOLN
0.0000 [IU] | Freq: Three times a day (TID) | SUBCUTANEOUS | Status: DC
  Administered 2015-01-29: 3 [IU] via SUBCUTANEOUS
  Administered 2015-01-29 – 2015-01-30 (×4): 2 [IU] via SUBCUTANEOUS
  Administered 2015-01-31: 3 [IU] via SUBCUTANEOUS
  Administered 2015-01-31: 2 [IU] via SUBCUTANEOUS
  Administered 2015-02-01: 5 [IU] via SUBCUTANEOUS
  Administered 2015-02-01: 2 [IU] via SUBCUTANEOUS
  Administered 2015-02-02 (×2): 5 [IU] via SUBCUTANEOUS
  Administered 2015-02-02: 3 [IU] via SUBCUTANEOUS
  Administered 2015-02-03 (×3): 2 [IU] via SUBCUTANEOUS
  Administered 2015-02-04: 9 [IU] via SUBCUTANEOUS
  Administered 2015-02-04: 7 [IU] via SUBCUTANEOUS
  Administered 2015-02-04 – 2015-02-05 (×2): 2 [IU] via SUBCUTANEOUS

## 2015-01-29 MED ORDER — ENSURE ENLIVE PO LIQD
237.0000 mL | Freq: Three times a day (TID) | ORAL | Status: DC
  Administered 2015-01-29 – 2015-02-05 (×13): 237 mL via ORAL
  Filled 2015-01-29: qty 237

## 2015-01-29 NOTE — Progress Notes (Signed)
Patient turned down dinner tray because he said his wife was bringing him dinner. However, when his wife came and brought him a BBQ sandwich, he only took two bites out of it. Stressed the importance of getting enough nutrients and protein. Patient promised he would finish his meal later, but "just isn't hungry" at the moment. Patient given another Ensure drink.   Roxan Hockey, RN

## 2015-01-29 NOTE — Progress Notes (Signed)
HeartMate 2 Rounding Note  Subjective:    Admitted after fall and failure to thrive. Yesterday received 1 liter NS.  Having L hip discomfort. XRAY negative.   Denies SOB.    LVAD INTERROGATION:  HeartMate II LVAD:  Flow 4.2 liters/min, speed 8800, power 5, PI 4.2.  20 PI events   Objective:    Vital Signs:   Temp:  [98.1 F (36.7 C)-98.5 F (36.9 C)] 98.1 F (36.7 C) (07/21 0743) Pulse Rate:  [77-96] 80 (07/21 0800) Resp:  [14-26] 17 (07/21 0800) BP: (88-111)/(70-83) 107/82 mmHg (07/20 1845) SpO2:  [93 %-100 %] 100 % (07/21 0800) Weight:  [134 lb (60.782 kg)-138 lb 7.2 oz (62.8 kg)] 138 lb 7.2 oz (62.8 kg) (07/21 0412)   Mean arterial Pressure 72   Intake/Output:   Intake/Output Summary (Last 24 hours) at 01/29/15 0900 Last data filed at 01/29/15 0400  Gross per 24 hour  Intake      0 ml  Output    150 ml  Net   -150 ml     Physical Exam: MAP 70-80 General:  Well appearing. No resp difficulty. In chair  HEENT: normal Neck: supple. JVP flat  Carotids 2+ bilat; no bruits. No lymphadenopathy or thryomegaly appreciated. Cor: Mechanical heart sounds with LVAD hum present. Lungs: clear Abdomen: soft, nontender, nondistended. No hepatosplenomegaly. No bruits or masses. Good bowel sounds. Driveline: C/D/I; securement device intact and driveline incorporated Extremities: no cyanosis, clubbing, rash, edema Neuro: alert & orientedx3, cranial nerves grossly intact. moves all 4 extremities w/o difficulty. Affect pleasant  Telemetry: SR 80s  Labs: Basic Metabolic Panel:  Recent Labs Lab 01/26/15 1045 01/28/15 1417 01/29/15 0244  NA 129* 128* 132*  K 4.0 4.0 3.7  CL 93* 94* 99*  CO2 24 23 26   GLUCOSE 151* 214* 200*  BUN 13 19 11   CREATININE 1.36* 1.09 0.90  CALCIUM 9.4 9.1 8.7*  MG 2.0  --   --     Liver Function Tests:  Recent Labs Lab 01/26/15 1045 01/29/15 0244  AST 81* 87*  ALT 61 63  ALKPHOS 140* 130*  BILITOT 1.0 0.6  PROT 8.2* 6.8  ALBUMIN 4.1  3.0*   No results for input(s): LIPASE, AMYLASE in the last 168 hours. No results for input(s): AMMONIA in the last 168 hours.  CBC:  Recent Labs Lab 01/26/15 1045 01/28/15 1417 01/29/15 0244  WBC 9.2 12.3* 10.9*  NEUTROABS  --   --  8.2*  HGB 12.4* 11.2* 9.9*  HCT 36.3* 33.1* 29.4*  MCV 85.2 84.9 84.5  PLT 304 256 238    INR:  Recent Labs Lab 01/26/15 1045 01/27/15 01/28/15 1417 01/29/15 0244  INR 2.34* 2.6 2.48* 2.92*    Other results:  EKG:   Imaging: Ct Abdomen Pelvis Wo Contrast  01/28/2015   CLINICAL DATA:  54 year old male with fall.  EXAM: CT CHEST WITHOUT CONTRAST  TECHNIQUE: Multidetector CT imaging of the chest was performed following the standard protocol without IV contrast.  COMPARISON:  CT dated 06/20/2014  FINDINGS: Evaluation of this exam is limited in the absence of intravenous contrast. Evaluation is also limited due to streak artifact caused by cardiac support devices.  The lungs are clear. No pleural effusion or pneumothorax. The central airways are patent.  The thoracic aorta appears grossly unremarkable. There is aortoiliac atherosclerotic disease. There is postsurgical changes of open heart surgery with left pectoral pacemaker device and an implantable left ventricular assist device. No cardiomegaly. No significant pericardial effusion. Visualized  heart anterior chest wall incisional scar. LVAD support wires noted in the anterior right abdominal wall.  No intra-abdominal free air or free fluid. The liver, gallbladder, pancreas, spleen, adrenal glands, kidneys, visualized ureters, and urinary bladder appear unremarkable. Small right renal upper pole hypodense lesion is not well characterized but likely represents a cyst. The prostate and seminal vesicles appear unremarkable.  Constipation with no evidence of bowel obstruction or inflammation. Normal appendix.  There is no adenopathy. No portal venous gas identified. There is focal induration of the  periumbilical subcutaneous soft tissue likely related to recent port insertion. No drainable fluid collection/abscess identified. There are stable sclerotic changes of T12-L2. No acute fracture.  IMPRESSION: No acute intrathoracic, abdominal, or pelvic pathology.  Cardiac pacemaker device and left ventricular assist. No significant pericardial effusion. Drainable fluid collection/ abscess.  Stable T12-L2 sclerotic changes.   Electronically Signed   By: Anner Crete M.D.   On: 01/28/2015 21:09   Dg Chest 2 View  01/28/2015   CLINICAL DATA:  Multiple falls and syncopal events  EXAM: CHEST - 2 VIEW  COMPARISON:  12/18/2013  FINDINGS: Cardiac shadow is stable. An LVAD is noted. Defibrillator is seen and stable in appearance. The lungs are well aerated without focal infiltrate or sizable effusion.  IMPRESSION: No acute abnormality noted.   Electronically Signed   By: Inez Catalina M.D.   On: 01/28/2015 21:05   Dg Pelvis 1-2 Views  01/28/2015   CLINICAL DATA:  Fall  EXAM: PELVIS - 1-2 VIEW  COMPARISON:  CT 06/20/2014  FINDINGS: Bony pelvic ring appears intact. No acute fracture line identified. Bilateral hips projects normally over the acetabula. Mild degenerative changes at the hips.  Surgical clips within the anatomic pelvis  Unremarkable appearance of the proximal femurs.  IMPRESSION: Negative for acute bony abnormality.  Signed,  Dulcy Fanny. Earleen Newport, DO  [facility]  [facility]   Electronically Signed   By: Corrie Mckusick D.O.   On: 01/28/2015 13:53   Ct Chest Wo Contrast  01/28/2015   CLINICAL DATA:  54 year old male with fall.  EXAM: CT CHEST WITHOUT CONTRAST  TECHNIQUE: Multidetector CT imaging of the chest was performed following the standard protocol without IV contrast.  COMPARISON:  CT dated 06/20/2014  FINDINGS: Evaluation of this exam is limited in the absence of intravenous contrast. Evaluation is also limited due to streak artifact caused by cardiac  support devices.  The lungs are clear. No pleural effusion or pneumothorax. The central airways are patent.  The thoracic aorta appears grossly unremarkable. There is aortoiliac atherosclerotic disease. There is postsurgical changes of open heart surgery with left pectoral pacemaker device and an implantable left ventricular assist device. No cardiomegaly. No significant pericardial effusion. Visualized heart anterior chest wall incisional scar. LVAD support wires noted in the anterior right abdominal wall.  No intra-abdominal free air or free fluid. The liver, gallbladder, pancreas, spleen, adrenal glands, kidneys, visualized ureters, and urinary bladder appear unremarkable. Small right renal upper pole hypodense lesion is not well characterized but likely represents a cyst. The prostate and seminal vesicles appear unremarkable.  Constipation with no evidence of bowel obstruction or inflammation. Normal appendix.  There is no adenopathy. No portal venous gas identified. There is focal induration of the periumbilical subcutaneous soft tissue likely related to recent port insertion. No drainable fluid collection/abscess identified. There are stable sclerotic changes of T12-L2. No acute fracture.  IMPRESSION: No acute intrathoracic, abdominal, or pelvic pathology.  Cardiac pacemaker device and  left ventricular assist. No significant pericardial effusion. Drainable fluid collection/ abscess.  Stable T12-L2 sclerotic changes.   Electronically Signed   By: Anner Crete M.D.   On: 01/28/2015 21:09      Medications:     Scheduled Medications: . amiodarone  200 mg Oral Daily  . amLODipine  5 mg Oral Daily  . atorvastatin  20 mg Oral QHS  . feeding supplement (ENSURE ENLIVE)  237 mL Oral BID BM  . insulin glargine  5 Units Subcutaneous QHS  . magnesium oxide  400 mg Oral Daily  . methocarbamol  500 mg Oral 4 times per day  . pantoprazole  40 mg Oral Daily  . warfarin  2.5 mg Oral Once per day on Sun Tue  Wed Thu Sat  . [START ON 01/30/2015] warfarin  5 mg Oral Once per day on Mon Fri     Infusions:     PRN Medications:  acetaminophen, albuterol, bisacodyl   Assessment:   1. Fall x2 on 7/20. Failure to Thrive.  No presyncope/-->landed on left hip. Xray now. No arrhythmias noted on interrogation.Consult PT.  2. Chronic systolic HF - Status post LVAD implantation for DT.  - 7/18 Speed turned down to 8800. (Did not want to go lower due to previous embolic event.). LDH ok.  INR 2.9 . - Volume status low. Off diuretics. Map 72 . Cut back norvasc to 2.5 mg daily.  2. Anticoagulation management - INR goal 1.7-2.2 with recent ICH. Off ASA. INR 2.9.   3. H/o Non Hodgkins Lymphoma s/p therapy- CT chest/abd/pelvis ok.  4.  H/O CVA 2008  5. H/O VT/VF --St Jude ICD 2010 - Interrogated device. No arrhythmias noted.   6. DMII- not on any meds. Glucose elevated on BMET.  Start checking CBGs.  7. 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 Interrogate device  8. Recent ICH 5/16.  -stable no new neuro deficits 9. Poor appetite/weight loss - No clear cause. Starting ensure. Dietitian consulted. Continue regular diet.  Start remeron 7.5 mg daily  10. Fever - UA with rare bacteria. CT chest/abd/pelvis ok.  11. Hyponatremia- NA on admit 129    I reviewed the LVAD parameters from today, and compared the results to the patient's prior recorded data.  No programming changes were made.  The LVAD is functioning within specified parameters.  The patient performs LVAD self-test daily.  LVAD interrogation was negative for any significant power changes, alarms or PI events/speed drops.  LVAD equipment check completed and is in good working order.  Back-up equipment present.   LVAD education done on emergency procedures and precautions and reviewed exit site care.  Length of Stay: 1  CLEGG,AMY NP-C  01/29/2015, 9:00 AM  VAD Team --- VAD ISSUES ONLY--- Pager [phone] (7am - 7am)  Advanced  Heart Failure Team  Pager [phone] (M-F; 7a - 4p)  Please contact [facility] for night-coverage after hours (4p -7a ) and weekends on [email or web address]   Patient seen and examined with Darrick Grinder, NP. We discussed all aspects of the encounter. I agree with the assessment and plan as stated above.   No further fevers. Cultures and CT negative. Appetite remains poor. I remain unclear as to etiology of his FTT. Question if it is in any way a sequelae of his recent ICH. VAD parameters stable. Will ask nutrition to see. Cardiac rehab consult. Possibly home tomorrow.   Bensimhon, Daniel,MD 5:31 PM

## 2015-01-29 NOTE — Progress Notes (Signed)
Initial Nutrition Assessment  DOCUMENTATION CODES:   Severe malnutrition in context of chronic illness  INTERVENTION:   Increase Ensure Enlive po to TID, each supplement provides 350 kcal and 20 grams of protein   NUTRITION DIAGNOSIS:   Malnutrition related to chronic illness as evidenced by severe depletion of body fat, severe depletion of muscle mass, energy intake < or equal to 75% for > or equal to 1 month, 11 percent weight loss x 1 month.   GOAL:   Patient will meet greater than or equal to 90% of their needs   MONITOR:   PO intake, Supplement acceptance, Skin, Labs  REASON FOR ASSESSMENT:   Consult Assessment of nutrition requirement/status  ASSESSMENT:   Pt admitted after 2 falls at home with left hip pain negative for fx. 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  Labs reviewed: sodium low Medications reviewed and include: started on remeron Per pt he was 170 lb before his LVAD He had been stable around 155 lb a month ago and is now down to 138 lb. Pt with 11% weight loss x 1 month. Pt reports no appetite due to all of his medications.  24 hr recall: Breakfast - none; Lunch - none; Supper: someone cooks he eats little of it. He had just started to drink ensure at home.  Pt lives with wife and his 2 adult daughters.  Pt had an episode of diarrhea while in his room.    Diet Order:  Diet regular Room service appropriate?: Yes; Fluid consistency:: Thin  Skin:  Reviewed, no issues  Last BM:  PTA  Height:   Ht Readings from Last 1 Encounters:  01/28/15 5\' 7"  (1.702 m)    Weight:   Wt Readings from Last 1 Encounters:  01/29/15 138 lb 7.2 oz (62.8 kg)    Ideal Body Weight:  67.2 kg  Wt Readings from Last 10 Encounters:  01/29/15 138 lb 7.2 oz (62.8 kg)  01/26/15 146 lb (66.225 kg)  01/13/15 145 lb 4.5 oz (65.9 kg)  01/06/15 156 lb 3.2 oz (70.852 kg)  12/24/14 157 lb  (71.215 kg)  12/19/14 155 lb 6.4 oz (70.489 kg)  12/10/14 156 lb 3.2 oz (70.852 kg)  12/01/14 157 lb (71.215 kg)  11/28/14 148 lb 9.4 oz (67.4 kg)  11/20/14 157 lb 8 oz (71.442 kg)    BMI:  Body mass index is 21.68 kg/(m^2).  Estimated Nutritional Needs:   Kcal:  1900-2100  Protein:  95-115 grams  Fluid:  > 1.5 L/day  EDUCATION NEEDS:   No education needs identified at this time  [place], [place], [place] Pager [phone] After Hours Pager

## 2015-01-29 NOTE — Care Management Note (Signed)
Case Management Note  Patient Details  Name: NAYSON TRAWEEK MRN: [id number] Date of Birth: 04-Nov-1960  Subjective/Objective:            Adm w weakness, hypotension, fever at home        Action/Plan: lives w wife, pcp heart failure clinic, has lvad   Expected Discharge Date:                  Expected Discharge Plan:  [place]  In-House Referral:     Discharge planning Services     Post Acute Care Choice:    Choice offered to:     DME Arranged:    DME Agency:     HH Arranged:    [place] Agency:     Status of Service:     Medicare Important Message Given:    Date Medicare IM Given:    Medicare IM give by:    Date Additional Medicare IM Given:    Additional Medicare Important Message give by:     If discussed at [place] of Stay Meetings, dates discussed:    Additional Comments: ur review done  Lacretia Leigh, RN 01/29/2015, 9:21 AM

## 2015-01-29 NOTE — Evaluation (Addendum)
Physical Therapy Evaluation Patient Details Name: Andre Tran MRN: [id number] DOB: 1960-08-26 Today's Date: 01/29/2015   History of Present Illness  Pt admitted after 2 falls at home  with left hip pain negative for fx. 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  Clinical Impression  [place] is very pleasant and moving well with baseline right sided deficits from CVA. Pt with continued decreased function in RUE which he wants to be better but educated pt for need to progress exercises on his own at this point with RUE due to chronic nature. Pt unable to state what led to his falls PTA and will benefit from acute therapy to further assess balance as well as perform transfers and gait to increase strength and function to help decrease fall risk. Pt with notably slow transfer with power sources but able to complete with increased time. Pt also twisting his battery harness and unaware of how to untwist and states he just leaves it twisted all the time. Assist provided to manage lines, LVAD and harness secondary to pt difficulty with problem solving. Pt with impaired problem solving and function with continued recommendation for 24hr supervision. Pt will benefit from acute therapy to maximize gait and balance to decrease fall risk.     Follow Up Recommendations No PT follow up;Other (comment) (pt reports plan for outpatient cardiac rehab)    Equipment Recommendations  None recommended by PT    Recommendations for Other Services       Precautions / Restrictions Precautions Precautions: Fall Precaution Comments: LVAD      Mobility  Bed Mobility Overal bed mobility: Modified Independent             General bed mobility comments: increased time with rail   Transfers Overall transfer level: Modified independent                  Ambulation/Gait Ambulation/Gait assistance: Supervision Ambulation Distance  (Feet): 900 Feet Assistive device: None Gait Pattern/deviations: Step-through pattern;Decreased stride length;Decreased dorsiflexion - right   Gait velocity interpretation: Below normal speed for age/gender General Gait Details: supervision for safety, decreased arm swing RUE, veering x 3 with ability to recover on his own  Stairs            Wheelchair Mobility    Modified Rankin (Stroke Patients Only)       Balance Overall balance assessment: Needs assistance   Sitting balance-Leahy Scale: Good       Standing balance-Leahy Scale: Good                               Pertinent Vitals/Pain Pain Assessment: No/denies pain  HR 89    Home Living Family/patient expects to be discharged to:: Private residence Living Arrangements: Spouse/significant other Available Help at Discharge: Family;Available 24 hours/day Type of Home: House Home Access: Stairs to enter Entrance Stairs-Rails: Right;Left;Can reach both Entrance Stairs-Number of Steps: 5 Home Layout: 1/2 bath on main level;Two level Home Equipment: Walker - 4 wheels;Cane - single point;Bedside commode;Wheelchair - manual;Shower seat      Prior Function Level of Independence: Independent         Comments: Unable to work or drive since CVA, sponge bathes and dresses himself with assist for cooking and household management     Hand Dominance        Extremity/Trunk Assessment   Upper  Extremity Assessment: RUE deficits/detail RUE Deficits / Details: decreased strength secondary to CVA with limited shoulder and hand function, not formally assessed         Lower Extremity Assessment: Generalized weakness      Cervical / Trunk Assessment: Normal  Communication   Communication: Expressive difficulties  Cognition Arousal/Alertness: Awake/alert Behavior During Therapy: WFL for tasks assessed/performed Overall Cognitive Status: No family/caregiver present to determine baseline cognitive  functioning Area of Impairment: Problem solving             Problem Solving: Slow processing;Difficulty sequencing      General Comments      Exercises        Assessment/Plan    PT Assessment Patient needs continued PT services  PT Diagnosis Abnormality of gait;Generalized weakness   PT Problem List Decreased balance  PT Treatment Interventions Balance training;Therapeutic activities;Patient/family education;Stair training;Gait training   PT Goals (Current goals can be found in the Care Plan section) Acute Rehab PT Goals Patient Stated Goal: return home PT Goal Formulation: With patient Time For Goal Achievement: 02/12/15 Potential to Achieve Goals: Good    Frequency Min 2X/week   Barriers to discharge   pt states wife or dgtr are at home all the time    Co-evaluation               End of Session   Activity Tolerance: Patient tolerated treatment well Patient left: in chair;with call bell/phone within reach Nurse Communication: Mobility status         Time: 1215-1255 PT Time Calculation (min) (ACUTE ONLY): 40 min   Charges:   PT Evaluation $Initial PT Evaluation Tier I: 1 Procedure PT Treatments $Therapeutic Activity: 8-22 mins   PT G CodesMelford Aase 01/29/2015, 1:46 PM Elwyn Reach, [place]

## 2015-01-29 NOTE — Progress Notes (Signed)
Inpatient Diabetes Program Recommendations  AACE/ADA: New Consensus Statement on Inpatient Glycemic Control (2013)  Target Ranges:  Prepandial:   less than 140 mg/dL      Peak postprandial:   less than 180 mg/dL (1-2 hours)      Critically ill patients:  140 - 180 mg/dL   Results for YOUSSEF, FOOTMAN (MRN [id number]) as of 01/29/2015 08:32  Ref. Range 01/28/2015 14:17 01/29/2015 02:44  Glucose Latest Ref Range: 65-99 mg/dL 214 (H) 200 (H)    Diabetes history: DM2 Outpatient Diabetes medications: Lantus 5 units HS Current orders for Inpatient glycemic control: Lantus 5 units QHS  Inpatient Diabetes Program Recommendations Correction (SSI): While inpatient, please consider ordering CBGs with Novolog sensitive correction scale.  Thanks, Barnie Alderman, RN, MSN, CCRN, CDE Diabetes Coordinator Inpatient Diabetes Program [phone] (Team Pager from [place] to [place]) [phone] ([facility] office) [phone] [facility] office) [phone] [facility] office)

## 2015-01-29 NOTE — Progress Notes (Signed)
ANTICOAGULATION CONSULT NOTE - Follow Up Consult  Pharmacy Consult for Coumadin Indication: LVAD  Allergies  Allergen Reactions  . Hydralazine Other (See Comments)    Severe headaches     Patient Measurements: Height: 5\' 7"  (170.2 cm) Weight: 138 lb 7.2 oz (62.8 kg) IBW/kg (Calculated) : 66.1  Vital Signs: Temp: 98.1 F (36.7 C) (07/21 0743) Temp Source: Oral (07/21 0743) Pulse Rate: 80 (07/21 0800)  Labs:  Recent Labs  01/27/15 01/28/15 1417 01/29/15 0244  HGB  --  11.2* 9.9*  HCT  --  33.1* 29.4*  PLT  --  256 238  LABPROT  --  26.5* 30.0*  INR 2.6 2.48* 2.92*  CREATININE  --  1.09 0.90    Estimated Creatinine Clearance: 86.3 mL/min (by C-G formula based on Cr of 0.9).  Assessment: 53yom on coumadin pta for his LVAD (10/13/2014), admitted s/p fall (did not hit his head). INR therapeutic on admission and coumadin resumed. INR today is supratherapeutic at 2.9 - aiming for a lower INR goal of 1.7-2.2 due to recent ICH (11/24/14). CBC, LDH are stable. No bleeding reported.  Goal of Therapy:  INR 1.7-2.2 Monitor platelets by anticoagulation protocol: Yes   Plan:  1) Hold coumadin tonight 2) INR in AM  Deboraha Sprang 01/29/2015,11:32 AM

## 2015-01-29 NOTE — Progress Notes (Signed)
Admitted 01/28/15 due to falls and failure to thrive.  HeartMate II LVAD implated on 10/13/14 by Dr. Cyndia Bent.   Vital signs: Temp:  98.1 - 98.5 HR:  77 - 96 Doppler MAP:  74 - 90 O2 Sat:  93 - 100 Weight in lbs: 134 > 138  Telemetry:  SR with 1st degree AV block  LVAD interrogation reveals:  Speed:  8800 Flow:  3.9 Power:  4.5 PI: 5.5 Alarms:  none Events:  20 PI events Fixed speed:  8800 Low speed limit: 8200  Drive Line:  Sorbaview dressing with Biopatch on exit site dry and intact; anchor device in place correctly. Dressing being changed weekly per sterile technique; last changed 01/26/15, due to be changed 02/02/15.  Labs:  LDH trend: 446 > 322  INR trend:  2.48 > 2.92    Plan/Recommendations as discussed with team: 1.  Hold coumadin per PharmD 2.  Remeron for appetite stimulant

## 2015-01-30 ENCOUNTER — Ambulatory Visit (HOSPITAL_COMMUNITY): Payer: Medicare Other

## 2015-01-30 ENCOUNTER — Encounter (HOSPITAL_COMMUNITY)
Admission: EM | Disposition: A | Discharge status: Home / Self Care | Payer: Self-pay | Source: Home / Self Care | Attending: Internal Medicine

## 2015-01-30 ENCOUNTER — Encounter (HOSPITAL_COMMUNITY): Payer: Self-pay | Admitting: *Deleted

## 2015-01-30 ENCOUNTER — Inpatient Hospital Stay (HOSPITAL_COMMUNITY): Payer: Medicare Other

## 2015-01-30 DIAGNOSIS — E43 Unspecified severe protein-calorie malnutrition: Secondary | ICD-10-CM

## 2015-01-30 HISTORY — PX: CARDIOVERSION: SHX1299

## 2015-01-30 LAB — GLUCOSE, CAPILLARY
Glucose-Capillary: 181 mg/dL — ABNORMAL HIGH (ref 65–99)
Glucose-Capillary: 168 mg/dL — ABNORMAL HIGH (ref 65–99)
Glucose-Capillary: 175 mg/dL — ABNORMAL HIGH (ref 65–99)
Glucose-Capillary: 149 mg/dL — ABNORMAL HIGH (ref 65–99)

## 2015-01-30 LAB — BASIC METABOLIC PANEL
ANION GAP: 8 (ref 5–15)
BUN: 10 mg/dL (ref 6–20)
CO2: 26 mmol/L (ref 22–32)
CREATININE: 1.12 mg/dL (ref 0.61–1.24)
Calcium: 9 mg/dL (ref 8.9–10.3)
Chloride: 96 mmol/L — ABNORMAL LOW (ref 101–111)
GFR calc non Af Amer: >60 mL/min (ref >60)
Glucose, Bld: 225 mg/dL — ABNORMAL HIGH (ref 65–99)
POTASSIUM: 4.5 mmol/L (ref 3.5–5.1)
SODIUM: 130 mmol/L — AB (ref 135–145)

## 2015-01-30 LAB — HEPATIC FUNCTION PANEL
ALT: 80 U/L — AB (ref 17–63)
AST: 122 U/L — AB (ref 15–41)
Albumin: 3.5 g/dL (ref 3.5–5.0)
Alkaline Phosphatase: 149 U/L — ABNORMAL HIGH (ref 38–126)
BILIRUBIN DIRECT: 0.3 mg/dL (ref 0.1–0.5)
Indirect Bilirubin: 0.5 mg/dL (ref 0.3–0.9)
Total Bilirubin: 0.8 mg/dL (ref 0.3–1.2)
Total Protein: 7.9 g/dL (ref 6.5–8.1)

## 2015-01-30 LAB — LACTATE DEHYDROGENASE: LDH: 302 U/L — ABNORMAL HIGH (ref 98–192)

## 2015-01-30 LAB — PROTIME-INR
INR: 3.48 — ABNORMAL HIGH (ref 0.00–1.49)
Prothrombin Time: 34.2 seconds — ABNORMAL HIGH (ref 11.6–15.2)

## 2015-01-30 LAB — SEDIMENTATION RATE: Sed Rate: 108 mm/hr — ABNORMAL HIGH (ref 0–16)

## 2015-01-30 SURGERY — CARDIOVERSION
Anesthesia: Monitor Anesthesia Care

## 2015-01-30 MED ORDER — WARFARIN - PHARMACIST DOSING INPATIENT
Freq: Every day | Status: DC
Start: 2015-01-30 — End: 2015-02-06
  Administered 2015-02-04 – 2015-02-05 (×2)

## 2015-01-30 NOTE — Progress Notes (Signed)
ANTICOAGULATION CONSULT NOTE - Follow Up Consult  Pharmacy Consult for Coumadin Indication: LVAD  Allergies  Allergen Reactions  . Hydralazine Other (See Comments)    Severe headaches     Patient Measurements: Height: 5\' 7"  (170.2 cm) Weight: 140 lb 14 oz (63.9 kg) IBW/kg (Calculated) : 66.1  Vital Signs: Temp: 99 F (37.2 C) (07/22 0758) Temp Source: Oral (07/22 0758) Pulse Rate: 92 (07/22 0800)  Labs:  Recent Labs  01/28/15 1417 01/29/15 0244 01/30/15 0358  HGB 11.2* 9.9*  --   HCT 33.1* 29.4*  --   PLT 256 238  --   LABPROT 26.5* 30.0* 34.2*  INR 2.48* 2.92* 3.48*  CREATININE 1.09 0.90  --     Estimated Creatinine Clearance: 87.8 mL/min (by C-G formula based on Cr of 0.9).  Assessment: 53yom on coumadin pta for his LVAD (10/13/2014), admitted s/p fall (did not hit his head). INR therapeutic on admission and coumadin resumed. INR today is supratherapeutic at 3.48 - aiming for a lower INR goal of 1.7-2.2 due to recent ICH (11/24/14). CBC, LDH are stable. No bleeding reported.  Goal of Therapy:  INR 1.7-2.2 Monitor platelets by anticoagulation protocol: Yes   Plan:  1) Hold coumadin tonight 2) INR in AM  Erin Hearing PharmD., BCPS Clinical Pharmacist Pager [phone] 01/30/2015 8:28 AM

## 2015-01-30 NOTE — Progress Notes (Signed)
Dr. Haroldine Laws aware of fever, orders received.  Will continue to monitor pt closely.

## 2015-01-30 NOTE — Clinical Documentation Improvement (Signed)
  Please document in medical record if you agree with the Registered Dietician's assessment of "Severe malnutrition in contest of chronic illness."  Malnutrition- Mild Malnutrition- Moderate Malnutrition- Severe Other Unable to suspect or determine  Thank you!  Pryor Montes, BSN, RN [place] HIM/Clinical Documentation Specialist [email or web address] [phone]

## 2015-01-30 NOTE — Progress Notes (Signed)
HeartMate 2 Rounding Note  Subjective:    Admitted after fall and failure to thrive. Received  1 liter NS.  XRAY L hip negative. Yesterday he was started on remeron.   Denies SOB.    LVAD INTERROGATION:  HeartMate II LVAD:  Flow 4.4 liters/min, speed 8800, power 4.9, PI 5.8.  6 PI events   Objective:    Vital Signs:   Temp:  [98.4 F (36.9 C)-99 F (37.2 C)] 99 F (37.2 C) (07/22 0758) Pulse Rate:  [80-89] 84 (07/21 1712) Resp:  [14-27] 25 (07/22 0758) SpO2:  [98 %-100 %] 100 % (07/22 0758) Weight:  [140 lb 14 oz (63.9 kg)] 140 lb 14 oz (63.9 kg) (07/22 0500) Last BM Date: 01/29/15 Mean arterial Pressure 82   Intake/Output:   Intake/Output Summary (Last 24 hours) at 01/30/15 0813 Last data filed at 01/30/15 0000  Gross per 24 hour  Intake    850 ml  Output    700 ml  Net    150 ml     Physical Exam: MAP 70-80 General:  Well appearing. No resp difficulty. In chair  HEENT: normal Neck: supple. JVP flat  Carotids 2+ bilat; no bruits. No lymphadenopathy or thryomegaly appreciated. Cor: Mechanical heart sounds with LVAD hum present. Lungs: clear Abdomen: soft, nontender, nondistended. No hepatosplenomegaly. No bruits or masses. Good bowel sounds. Driveline: C/D/I; securement device intact and driveline incorporated Extremities: no cyanosis, clubbing, rash, edema Neuro: alert & orientedx3, cranial nerves grossly intact. moves all 4 extremities w/o difficulty. Affect flat  Telemetry: SR 80s  Labs: Basic Metabolic Panel:  Recent Labs Lab 01/26/15 1045 01/28/15 1417 01/29/15 0244  NA 129* 128* 132*  K 4.0 4.0 3.7  CL 93* 94* 99*  CO2 24 23 26   GLUCOSE 151* 214* 200*  BUN 13 19 11   CREATININE 1.36* 1.09 0.90  CALCIUM 9.4 9.1 8.7*  MG 2.0  --   --     Liver Function Tests:  Recent Labs Lab 01/26/15 1045 01/29/15 0244  AST 81* 87*  ALT 61 63  ALKPHOS 140* 130*  BILITOT 1.0 0.6  PROT 8.2* 6.8  ALBUMIN 4.1 3.0*   No results for input(s): LIPASE,  AMYLASE in the last 168 hours. No results for input(s): AMMONIA in the last 168 hours.  CBC:  Recent Labs Lab 01/26/15 1045 01/28/15 1417 01/29/15 0244  WBC 9.2 12.3* 10.9*  NEUTROABS  --   --  8.2*  HGB 12.4* 11.2* 9.9*  HCT 36.3* 33.1* 29.4*  MCV 85.2 84.9 84.5  PLT 304 256 238    INR:  Recent Labs Lab 01/26/15 1045 01/27/15 01/28/15 1417 01/29/15 0244 01/30/15 0358  INR 2.34* 2.6 2.48* 2.92* 3.48*    Other results:  EKG:   Imaging: Ct Abdomen Pelvis Wo Contrast  01/28/2015   CLINICAL DATA:  54 year old male with fall.  EXAM: CT CHEST WITHOUT CONTRAST  TECHNIQUE: Multidetector CT imaging of the chest was performed following the standard protocol without IV contrast.  COMPARISON:  CT dated 06/20/2014  FINDINGS: Evaluation of this exam is limited in the absence of intravenous contrast. Evaluation is also limited due to streak artifact caused by cardiac support devices.  The lungs are clear. No pleural effusion or pneumothorax. The central airways are patent.  The thoracic aorta appears grossly unremarkable. There is aortoiliac atherosclerotic disease. There is postsurgical changes of open heart surgery with left pectoral pacemaker device and an implantable left ventricular assist device. No cardiomegaly. No significant pericardial effusion. Visualized heart  anterior chest wall incisional scar. LVAD support wires noted in the anterior right abdominal wall.  No intra-abdominal free air or free fluid. The liver, gallbladder, pancreas, spleen, adrenal glands, kidneys, visualized ureters, and urinary bladder appear unremarkable. Small right renal upper pole hypodense lesion is not well characterized but likely represents a cyst. The prostate and seminal vesicles appear unremarkable.  Constipation with no evidence of bowel obstruction or inflammation. Normal appendix.  There is no adenopathy. No portal venous gas identified. There is focal induration of the periumbilical subcutaneous  soft tissue likely related to recent port insertion. No drainable fluid collection/abscess identified. There are stable sclerotic changes of T12-L2. No acute fracture.  IMPRESSION: No acute intrathoracic, abdominal, or pelvic pathology.  Cardiac pacemaker device and left ventricular assist. No significant pericardial effusion. Drainable fluid collection/ abscess.  Stable T12-L2 sclerotic changes.   Electronically Signed   By: Anner Crete M.D.   On: 01/28/2015 21:09   Dg Chest 2 View  01/28/2015   CLINICAL DATA:  Multiple falls and syncopal events  EXAM: CHEST - 2 VIEW  COMPARISON:  12/18/2013  FINDINGS: Cardiac shadow is stable. An LVAD is noted. Defibrillator is seen and stable in appearance. The lungs are well aerated without focal infiltrate or sizable effusion.  IMPRESSION: No acute abnormality noted.   Electronically Signed   By: Inez Catalina M.D.   On: 01/28/2015 21:05   Dg Pelvis 1-2 Views  01/28/2015   CLINICAL DATA:  Fall  EXAM: PELVIS - 1-2 VIEW  COMPARISON:  CT 06/20/2014  FINDINGS: Bony pelvic ring appears intact. No acute fracture line identified. Bilateral hips projects normally over the acetabula. Mild degenerative changes at the hips.  Surgical clips within the anatomic pelvis  Unremarkable appearance of the proximal femurs.  IMPRESSION: Negative for acute bony abnormality.  Signed,  Dulcy Fanny. Earleen Newport, DO  [facility]  [facility]   Electronically Signed   By: Corrie Mckusick D.O.   On: 01/28/2015 13:53   Ct Chest Wo Contrast  01/28/2015   CLINICAL DATA:  54 year old male with fall.  EXAM: CT CHEST WITHOUT CONTRAST  TECHNIQUE: Multidetector CT imaging of the chest was performed following the standard protocol without IV contrast.  COMPARISON:  CT dated 06/20/2014  FINDINGS: Evaluation of this exam is limited in the absence of intravenous contrast. Evaluation is also limited due to streak artifact caused by cardiac support devices.  The lungs  are clear. No pleural effusion or pneumothorax. The central airways are patent.  The thoracic aorta appears grossly unremarkable. There is aortoiliac atherosclerotic disease. There is postsurgical changes of open heart surgery with left pectoral pacemaker device and an implantable left ventricular assist device. No cardiomegaly. No significant pericardial effusion. Visualized heart anterior chest wall incisional scar. LVAD support wires noted in the anterior right abdominal wall.  No intra-abdominal free air or free fluid. The liver, gallbladder, pancreas, spleen, adrenal glands, kidneys, visualized ureters, and urinary bladder appear unremarkable. Small right renal upper pole hypodense lesion is not well characterized but likely represents a cyst. The prostate and seminal vesicles appear unremarkable.  Constipation with no evidence of bowel obstruction or inflammation. Normal appendix.  There is no adenopathy. No portal venous gas identified. There is focal induration of the periumbilical subcutaneous soft tissue likely related to recent port insertion. No drainable fluid collection/abscess identified. There are stable sclerotic changes of T12-L2. No acute fracture.  IMPRESSION: No acute intrathoracic, abdominal, or pelvic pathology.  Cardiac pacemaker device and left  ventricular assist. No significant pericardial effusion. Drainable fluid collection/ abscess.  Stable T12-L2 sclerotic changes.   Electronically Signed   By: Anner Crete M.D.   On: 01/28/2015 21:09     Medications:     Scheduled Medications: . amiodarone  200 mg Oral Daily  . amLODipine  2.5 mg Oral Daily  . atorvastatin  20 mg Oral QHS  . feeding supplement (ENSURE ENLIVE)  237 mL Oral TID BM  . insulin aspart  0-9 Units Subcutaneous TID WC  . insulin glargine  5 Units Subcutaneous QHS  . magnesium oxide  400 mg Oral Daily  . methocarbamol  500 mg Oral 4 times per day  . mirtazapine  7.5 mg Oral QHS  . pantoprazole  40 mg Oral  Daily    Infusions:    PRN Medications: acetaminophen, albuterol, bisacodyl   Assessment:   1. Fall x2 on 7/20. Failure to Thrive.  No presyncope/-->landed on left hip. Xray now. No arrhythmias noted on interrogation.Consult PT. Continue regular diet.  2. Chronic systolic HF - Status post LVAD implantation for DT.  - 7/18 Speed turned down to 8800. (Did not want to go lower due to previous embolic event.). LDH ok.  INR 2.9 . - Volume status low. Off diuretics. Map 80 . Continue norvasc to 2.5 mg daily.  2. Anticoagulation management - INR goal 1.7-2.2 with recent ICH. Off ASA. INR 3.48 Holding coumadin.    3. H/o Non Hodgkins Lymphoma s/p therapy- CT chest/abd/pelvis ok.  4.  H/O CVA 2008  5. H/O VT/VF --St Jude ICD 2010 - Interrogated device. No arrhythmias noted.   6. DMII- not on any meds. Glucose elevated on BMET.  Start checking CBGs.  7. 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 Interrogate device  8. Recent ICH 5/16.  -stable no new neuro deficits 9. Poor appetite/weight loss - No clear cause. Starting ensure. Dietitian consulted. Continue regular diet.  Continue  remeron 7.5 mg daily  10. Fever - UA with rare bacteria. CT chest/abd/pelvis ok.  11. Hyponatremia- NA on admit 129 >130 12. Severe malnutrition in context of chronic illness- Per dietitan. Severe depletion of muscle mass, energy intake < or equal to 75% for > or equal to 1 month, 11 percent weight loss x 1 month.  Possible D/C today   I reviewed the LVAD parameters from today, and compared the results to the patient's prior recorded data.  No programming changes were made.  The LVAD is functioning within specified parameters.  The patient performs LVAD self-test daily.  LVAD interrogation was negative for any significant power changes, alarms or PI events/speed drops.  LVAD equipment check completed and is in good working order.  Back-up equipment present.   LVAD education done on emergency  procedures and precautions and reviewed exit site care.  Length of Stay: 2  CLEGG,AMY NP-C  01/30/2015, 8:13 AM  VAD Team --- VAD ISSUES ONLY--- Pager [phone] (7am - 7am)  Advanced Heart Failure Team  Pager [phone] (M-F; 7a - 4p)  Please contact [facility] for night-coverage after hours (4p -7a ) and weekends on [email or web address]   Patient seen and examined with Darrick Grinder, NP. We discussed all aspects of the encounter. I agree with the assessment and plan as stated above.   He continues with severely decreased appetite and ongoing weight loss of unclear etiology. Remeron started yesterday will continue. May need to consider SSRI. Encourage po intake. Given PI events we turned VAD speed down to 8600.  Shiron Whetsel,MD 3:18 PM

## 2015-01-30 NOTE — Progress Notes (Signed)
Andre Tran notified of pt temp 103.  She will notify Dr. Haroldine Laws.  Will continue to monitor pt closely.

## 2015-01-30 NOTE — Progress Notes (Signed)
Admitted 01/28/15 due to falls and failure to thrive.  HeartMate II LVAD implated on 10/13/14 by Dr. Cyndia Bent.   Vital signs: Temp:  98.1 - 99.0 HR:  80 - 92 Doppler MAP:  72 - 85 O2 Sat:  98 - 100 Weight in lbs: 134 > 138 > 140  Telemetry:  SR with 1st degree AV block  LVAD interrogation reveals:  Speed:  8800 Flow:  4.4  Power:  4.7 PI: 6.1 Alarms:  none Events:  45 PI events (01/29/15) Fixed speed:  8800 Low speed limit: 8200  Drive Line:  Sorbaview dressing with Biopatch on exit site dry and intact; anchor device in place correctly. Dressing being changed weekly per sterile technique; last changed 01/26/15, due to be changed 02/02/15.  Labs:  LDH trend: 446 > 322 > 302  INR trend:  2.48 > 2.92 > 3.48   Plan/Recommendations as discussed with team: 1.  Hold coumadin per PharmD. (INR goal 1.7 - 2.2 due to [place] post LVAD) 2.  Increased # of PI events over last 24 hrs

## 2015-01-30 NOTE — Progress Notes (Signed)
CARDIAC REHAB PHASE I   PRE:  Rate/Rhythm: 85 SR  BP:  Lying: 82 MAP  Sitting: 82 MAP        SaO2: 98 RA  MODE:  Ambulation: 950 ft   POST:  Rate/Rhythm: 116 ST  BP:  Sitting: 96 MAP         SaO2: 96 RA  Pt lying in bed, somewhat lethargic, states he is tired and doesn't want to eat. Pt agreeable to walk. Pt unable to complete transfer to battery packs independently which is a significant change from when we saw him after VAD placement. Pt needed no assistance previously.  Pt has more pronounced R sided weakness and decreased motor control on the R side. Pt does have history of stoke and some R sided weakness but was able to compensate previously.  Pt seems slow to process and has difficulty with sequencing activities. Pt also seems to have some receptive and expressive aphasia which is new since we last saw him. Pt unable to recall words and details of his daughters during conversation (could not recall where his daughter is attending school). Pt stood with minimal assistance. Pt ambulated 950 ft on RA, moderate, unsteady gait (pt veers to the right often), tolerated well. Pt did c/o L hip pain/soreness, HR somewhat elevated 120s, denies CP, dizziness, DOE, declined rest stop. Pt to recliner after walk, feet elevated, VSS, call bell within reach. Pt again required assistance to connect to main power source.  Pt closing his eyes, drowsy on return to room.  Discussed pt condition with RN, who will notify MD. Will follow-up tomorrow.   [phone]   Lenna Sciara, RN, BSN 01/30/2015 11:57 AM

## 2015-01-31 ENCOUNTER — Inpatient Hospital Stay (HOSPITAL_COMMUNITY): Payer: Medicare Other | Admitting: Anesthesiology

## 2015-01-31 ENCOUNTER — Inpatient Hospital Stay (HOSPITAL_COMMUNITY): Payer: Medicare Other

## 2015-01-31 DIAGNOSIS — R509 Fever, unspecified: Secondary | ICD-10-CM | POA: Insufficient documentation

## 2015-01-31 DIAGNOSIS — R6251 Failure to thrive (child): Secondary | ICD-10-CM | POA: Insufficient documentation

## 2015-01-31 LAB — BASIC METABOLIC PANEL
Anion gap: 9 (ref 5–15)
BUN: 9 mg/dL (ref 6–20)
CALCIUM: 8.7 mg/dL — AB (ref 8.9–10.3)
CO2: 26 mmol/L (ref 22–32)
Chloride: 95 mmol/L — ABNORMAL LOW (ref 101–111)
Creatinine, Ser: 1.05 mg/dL (ref 0.61–1.24)
GFR calc Af Amer: >60 mL/min (ref >60)
GFR calc non Af Amer: >60 mL/min (ref >60)
Glucose, Bld: 202 mg/dL — ABNORMAL HIGH (ref 65–99)
Potassium: 3.5 mmol/L (ref 3.5–5.1)
Sodium: 130 mmol/L — ABNORMAL LOW (ref 135–145)

## 2015-01-31 LAB — CULTURE, BLOOD (ROUTINE X 2): CULTURE: NO GROWTH

## 2015-01-31 LAB — URINALYSIS W MICROSCOPIC (NOT AT ARMC)
Bilirubin Urine: NEGATIVE
Glucose, UA: 250 mg/dL — AB
Ketones, ur: NEGATIVE mg/dL
Leukocytes, UA: NEGATIVE
Nitrite: NEGATIVE
Protein, ur: >300 mg/dL — AB
SPECIFIC GRAVITY, URINE: 1.022 (ref 1.005–1.030)
Urobilinogen, UA: 1 mg/dL (ref 0.0–1.0)
pH: 6 (ref 5.0–8.0)

## 2015-01-31 LAB — PROTIME-INR
INR: 2.41 — ABNORMAL HIGH (ref 0.00–1.49)
Prothrombin Time: 25.9 seconds — ABNORMAL HIGH (ref 11.6–15.2)

## 2015-01-31 LAB — LACTATE DEHYDROGENASE: LDH: 341 U/L — ABNORMAL HIGH (ref 98–192)

## 2015-01-31 LAB — GLUCOSE, CAPILLARY
GLUCOSE-CAPILLARY: 196 mg/dL — AB (ref 65–99)
GLUCOSE-CAPILLARY: 136 mg/dL — AB (ref 65–99)
GLUCOSE-CAPILLARY: 169 mg/dL — AB (ref 65–99)
Glucose-Capillary: 233 mg/dL — ABNORMAL HIGH (ref 65–99)

## 2015-01-31 MED ORDER — WARFARIN SODIUM 1 MG PO TABS
1.0000 mg | ORAL_TABLET | Freq: Once | ORAL | Status: AC
  Administered 2015-01-31: 1 mg via ORAL
  Filled 2015-01-31: qty 1

## 2015-01-31 MED ORDER — ETOMIDATE 2 MG/ML IV SOLN
INTRAVENOUS | Status: DC | PRN
  Administered 2015-01-31: 10 mg via INTRAVENOUS

## 2015-01-31 MED ORDER — FENTANYL CITRATE (PF) 100 MCG/2ML IJ SOLN
INTRAMUSCULAR | Status: AC
  Administered 2015-01-31: 50 ug via INTRAVENOUS
  Filled 2015-01-31: qty 2

## 2015-01-31 MED ORDER — MIDAZOLAM HCL 2 MG/2ML IJ SOLN
1.0000 mg | INTRAMUSCULAR | Status: DC | PRN
  Administered 2015-01-31: 1 mg via INTRAVENOUS

## 2015-01-31 MED ORDER — SODIUM CHLORIDE 0.9 % IV SOLN
INTRAVENOUS | Status: DC | PRN
  Administered 2015-01-31: 16:00:00 via INTRAVENOUS

## 2015-01-31 MED ORDER — POTASSIUM CHLORIDE CRYS ER 20 MEQ PO TBCR
20.0000 meq | EXTENDED_RELEASE_TABLET | Freq: Once | ORAL | Status: AC
  Administered 2015-01-31: 20 meq via ORAL
  Filled 2015-01-31: qty 1

## 2015-01-31 MED ORDER — FENTANYL CITRATE (PF) 100 MCG/2ML IJ SOLN
50.0000 ug | INTRAMUSCULAR | Status: AC | PRN
  Administered 2015-01-31: 50 ug via INTRAVENOUS

## 2015-01-31 MED ORDER — MIDAZOLAM HCL 2 MG/2ML IJ SOLN
INTRAMUSCULAR | Status: AC
  Administered 2015-01-31: 1 mg via INTRAVENOUS
  Filled 2015-01-31: qty 2

## 2015-01-31 NOTE — CV Procedure (Signed)
    TRANSESOPHAGEAL ECHOCARDIOGRAM   NAME:  Andre Tran   MRN: [id number] DOB:  [birth year]   ADMIT DATE: 01/28/2015  INDICATIONS:  Fever. Assess for endocarditis  PROCEDURE:    Informed consent was obtained prior to the procedure. The risks, benefits and alternatives for the procedure were discussed and the patient comprehended these risks.  Risks include, but are not limited to, cough, sore throat, vomiting, nausea, somnolence, esophageal and stomach trauma or perforation, bleeding, low blood pressure, aspiration, pneumonia, infection, trauma to the teeth and death.    After a procedural time-out, the patient was sedated by the anesthesia service with versed and fentanyl.   The oropharynx was anesthetized with cetacaine spray.  The transesophageal probe was inserted in the esophagus and stomach without difficulty and multiple views were obtained.    COMPLICATIONS:    There were no immediate complications.  FINDINGS:  LEFT VENTRICLE: EF = 40-45% With VAD cannula in apex. Septal dyssnergy  RIGHT VENTRICLE: Normal size. Mild HK + pacing wire  LEFT ATRIUM: Dilated  LEFT ATRIAL APPENDAGE: No thrombus.   RIGHT ATRIUM: Dilated. + pacing wire  AORTIC VALVE:  Trileaflet. Opens minimally. Mild AI. No vegetation   MITRAL VALVE:  Mild prolapse. Trivial MR. No vegetation.   TRICUSPID VALVE: Normal. Trivial MR. No vegetation.   PULMONIC VALVE: Normal. No PR.  No vegetation.   PERICARDIUM: No effusion  DESCENDING AORTA: Not visualized   CONCLUSION:  No TEE evidence of endocarditis.   Bensimhon, Daniel,MD 4:45 PM

## 2015-01-31 NOTE — Progress Notes (Signed)
Echocardiogram Echocardiogram Transesophageal has been performed.  Joelene Millin 01/31/2015, 4:58 PM

## 2015-01-31 NOTE — Transfer of Care (Signed)
Immediate Anesthesia Transfer of Care Note  Patient: Andre Tran  Procedure(s) Performed: Procedure(s): CARDIOVERSION (N/A)  Patient Location: ICU  Anesthesia Type:MAC  Level of Consciousness: awake, oriented, sedated, patient cooperative and responds to stimulation  Airway & Oxygen Therapy: Patient Spontanous Breathing and Patient connected to nasal cannula oxygen  Post-op Assessment: Report given to RN, Post -op Vital signs reviewed and stable, Patient moving all extremities and Patient moving all extremities X 4  Post vital signs: Reviewed and stable  Last Vitals:  Filed Vitals:   01/31/15 1524  BP: 88/47  Pulse: 77  Temp: 39.4 C  Resp: 20    Complications: No apparent anesthesia complications

## 2015-01-31 NOTE — Anesthesia Postprocedure Evaluation (Signed)
  Anesthesia Post-op Note  Patient: Andre Tran  Procedure(s) Performed: Procedure(s): CARDIOVERSION (N/A)  Patient Location: ICU , 2Heart Anesthesia Type:MAC  Level of Consciousness: awake, alert , oriented and patient cooperative  Airway and Oxygen Therapy: Patient Spontanous Breathing and Patient connected to nasal cannula oxygen  Post-op Pain: none  Post-op Assessment: Post-op Vital signs reviewed, Patient's Cardiovascular Status Stable, Respiratory Function Stable, Patent Airway, No signs of Nausea or vomiting and Pain level controlled              Post-op Vital Signs: Reviewed and stable with LVAD  Last Vitals:  Filed Vitals:   01/31/15 1524  BP: 88/47  Pulse: 77  Temp: 39.4 C  Resp: 20    Complications: No apparent anesthesia complications

## 2015-01-31 NOTE — Progress Notes (Signed)
CARDIAC REHAB PHASE I   PRE:  Rate/Rhythm: 83 SR  BP:  Supine:   Sitting: 86/ Dopplered  Standing:    SaO2: 98 RA  MODE:  Ambulation: 112 ft   POST:  Rate/Rhythm: 127 ST  BP:  Supine:   Sitting: 90/ Dopplered  Standing:    SaO2: 93 RA 1310-1400  On arrival pt in bed. Assisted X 1 to amuilate. Pt seemed more like himself today. He was able to connect to the batteries in preparation for walking. Pt c/o of left hip pain walking. He had to sit in hall in chair due to the pain, but was able to walk back to the room. He was only able to walk 112 feet. VS stable Pt back to bed after walk with call light in reach. He was completely exhausted by end of walk and I connect him back to the power source.  Rodney Langton RN 01/31/2015 1:54 PM

## 2015-01-31 NOTE — Anesthesia Preprocedure Evaluation (Signed)
Anesthesia Evaluation  Patient identified by MRN, date of birth, ID band Patient awake    Reviewed: Allergy & Precautions, NPO status , Patient's Chart, lab work & pertinent test results  History of Anesthesia Complications Negative for: history of anesthetic complications  Airway Mallampati: II  TM Distance: >3 FB Neck ROM: Full    Dental  (+) Missing, Dental Advisory Given   Pulmonary COPD COPD inhaler,  breath sounds clear to auscultation        Cardiovascular hypertension, + Peripheral Vascular Disease and +CHF + dysrhythmias Atrial Fibrillation + pacemaker + Cardiac Defibrillator Rhythm:Regular Rate:Normal  Chemo induced cardiomyopathy: HMII LVAD    Neuro/Psych 5/16 IC Hemorrhage  CVA    GI/Hepatic negative GI ROS, Neg liver ROS,   Endo/Other  diabetes (glu 196), Insulin Dependent  Renal/GU negative Renal ROS     Musculoskeletal   Abdominal   Peds  Hematology  (+) Blood dyscrasia (coumadin INR 2.41, Hb 9.9), , Lymphoma: chemo   Anesthesia Other Findings   Reproductive/Obstetrics                             Anesthesia Physical Anesthesia Plan  ASA: IV  Anesthesia Plan: MAC   Post-op Pain Management:    Induction: Intravenous  Airway Management Planned: Natural Airway and Simple Face Mask  Additional Equipment:   Intra-op Plan:   Post-operative Plan:   Informed Consent: I have reviewed the patients History and Physical, chart, labs and discussed the procedure including the risks, benefits and alternatives for the proposed anesthesia with the patient or authorized representative who has indicated his/her understanding and acceptance.   Dental advisory given  Plan Discussed with: CRNA and Surgeon  Anesthesia Plan Comments: (Plan routine monitors, MAC)        Anesthesia Quick Evaluation

## 2015-01-31 NOTE — Progress Notes (Signed)
HeartMate 2 Rounding Note  Subjective:    Admitted after fall and failure to thrive and paroxysmal fevers. Recent 30 pound weight loss.   Had fever to 102 and 103 again yesterday. CT C/A/P no evidence of recurrent lymphoma or abscess. LDH stable ~ 300. ESR 108.   Feels better this am. Eating breakfast. Afebrile. No pain. VAD speed turned down to 8600 yesterday.   LVAD INTERROGATION:  HeartMate II LVAD:  Flow 3.9 liters/min, speed 8600, power 4.3, PI 5.9.  21 PI events   Objective:    Vital Signs:   Temp:  [97.3 F (36.3 C)-103 F (39.4 C)] 97.7 F (36.5 C) (07/23 0407) Pulse Rate:  [73-103] 74 (07/23 0400) Resp:  [19-25] 19 (07/23 0400) BP: (82)/(0) 82/0 mmHg (07/22 1026) SpO2:  [98 %-100 %] 100 % (07/23 0400) Weight:  [63 kg (138 lb 14.2 oz)] 63 kg (138 lb 14.2 oz) (07/23 0407) Last BM Date: 01/29/15 Mean arterial Pressure 80s  Intake/Output:   Intake/Output Summary (Last 24 hours) at 01/31/15 0808 Last data filed at 01/31/15 0700  Gross per 24 hour  Intake    120 ml  Output    250 ml  Net   -130 ml     Physical Exam: MAP 80s General:  Sitting up in bed eating breakfast. Fatigued No resp difficulty.  HEENT: normal Neck: supple. JVP flat  Carotids 2+ bilat; no bruits. No lymphadenopathy or thryomegaly appreciated. Cor: Mechanical heart sounds with LVAD hum present. Lungs: clear Abdomen: soft, nontender, nondistended. No hepatosplenomegaly. No bruits or masses. Good bowel sounds. Driveline: C/D/I; securement device intact and driveline incorporated Extremities: no cyanosis, clubbing, rash, edema Neuro: alert & orientedx3, cranial nerves grossly intact. moves all 4 extremities w/o difficulty. Affect flat  Telemetry: SR 80s   Labs: Basic Metabolic Panel:  Recent Labs Lab 01/26/15 1045 01/28/15 1417 01/29/15 0244 01/30/15 0944 01/31/15 0225  NA 129* 128* 132* 130* 130*  K 4.0 4.0 3.7 4.5 3.5  CL 93* 94* 99* 96* 95*  CO2 $Re'24 23 26 26 26  'TWl$ GLUCOSE 151* 214*  200* 225* 202*  BUN $Re'13 19 11 10 9  'rqL$ CREATININE 1.36* 1.09 0.90 1.12 1.05  CALCIUM 9.4 9.1 8.7* 9.0 8.7*  MG 2.0  --   --   --   --     Liver Function Tests:  Recent Labs Lab 01/26/15 1045 01/29/15 0244 01/30/15 0944  AST 81* 87* 122*  ALT 61 63 80*  ALKPHOS 140* 130* 149*  BILITOT 1.0 0.6 0.8  PROT 8.2* 6.8 7.9  ALBUMIN 4.1 3.0* 3.5   No results for input(s): LIPASE, AMYLASE in the last 168 hours. No results for input(s): AMMONIA in the last 168 hours.  CBC:  Recent Labs Lab 01/26/15 1045 01/28/15 1417 01/29/15 0244  WBC 9.2 12.3* 10.9*  NEUTROABS  --   --  8.2*  HGB 12.4* 11.2* 9.9*  HCT 36.3* 33.1* 29.4*  MCV 85.2 84.9 84.5  PLT 304 256 238    INR:  Recent Labs Lab 01/27/15 01/28/15 1417 01/29/15 0244 01/30/15 0358 01/31/15 0225  INR 2.6 2.48* 2.92* 3.48* 2.41*    Other results:    Imaging: Ct Head Wo Contrast  01/30/2015   CLINICAL DATA:  Previous stroke with right-sided weakness  EXAM: CT HEAD WITHOUT CONTRAST  TECHNIQUE: Contiguous axial images were obtained from the base of the skull through the vertex without intravenous contrast.  COMPARISON:  01/12/2015  FINDINGS: The bony calvarium is intact. There are changes consistent  with prior ischemic event in the left basal ganglia with encephalomalacia changes. Changes in the posterior right temporal lobe are also noted consistent with mild encephalomalacia. No findings to suggest acute hemorrhage, acute infarction or space-occupying mass lesion is identified.  IMPRESSION: Chronic ischemic changes without acute abnormality.   Electronically Signed   By: Inez Catalina M.D.   On: 01/30/2015 14:49     Medications:     Scheduled Medications: . amiodarone  200 mg Oral Daily  . amLODipine  2.5 mg Oral Daily  . atorvastatin  20 mg Oral QHS  . feeding supplement (ENSURE ENLIVE)  237 mL Oral TID BM  . insulin aspart  0-9 Units Subcutaneous TID WC  . insulin glargine  5 Units Subcutaneous QHS  . magnesium  oxide  400 mg Oral Daily  . methocarbamol  500 mg Oral 4 times per day  . mirtazapine  7.5 mg Oral QHS  . pantoprazole  40 mg Oral Daily  . Warfarin - Pharmacist Dosing Inpatient   Does not apply q1800    Infusions:    PRN Medications: acetaminophen, albuterol, bisacodyl   Assessment:   1. Fall x2 on 7/20. Failure to Thrive.  2. Chronic systolic HF - Status post LVAD implantation for DT.  - 7/22 Speed turned down to 8600. (Did not want to go lower due to previous embolic event.). LDH ok.  INR 2.9 . - Volume status low. Off diuretics. Map 80 . Continue norvasc to 2.5 mg daily.  2. Anticoagulation management - INR goal 1.7-2.2 with recent ICH. 3. H/o Non Hodgkins Lymphoma s/p therapy- CT chest/abd/pelvis ok.  4.  H/O CVA 2008  5. H/O VT/VF --St Jude ICD 2010 - Interrogated device. No arrhythmias noted.   6. DMII- not on any meds. Glucose elevated on BMET.  Start checking CBGs.  7. 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 Interrogate device  8. Recent ICH 5/16.  -stable no new neuro deficits. Head CT - no recurrence 9. Poor appetite/weight loss - No clear cause. Starting ensure. Dietitian consulted. Continue regular diet.  Continue  remeron 7.5 mg daily  10. Fever - UA with rare bacteria. CT chest/abd/pelvis ok. [place]. ESR 108 11. Hyponatremia- NA on admit 129 >130 12. Severe malnutrition in context of chronic illness- Per dietitan. Severe depletion of muscle mass, energy intake < or equal to 75% for > or equal to 1 month, 11 percent weight loss x 1 month.  He continues with intermittent fevers. [place] redrawn. CT C/A/P - no CT C/A/P no evidence of recurrent lymphoma or abscess. LDH stable ~ 300. ESR 108. I worry about culture negative endocarditis. Will plan TEE. I spoke with Dr. Johnnye Sima in [place] who agrees. No need for empiric abx now. Continue to follow [place]. Numerous PI events. VAD speed turned down yesterday.    reviewed the LVAD parameters from today, and compared  the results to the patient's prior recorded data.  No programming changes were made.  The LVAD is functioning within specified parameters.  The patient performs LVAD self-test daily.  LVAD interrogation was negative for any significant power changes, alarms or PI events/speed drops.  LVAD equipment check completed and is in good working order.  Back-up equipment present.   LVAD education done on emergency procedures and precautions and reviewed exit site care.  Length of Stay: 3  Glori Bickers MD 01/31/2015, 8:08 AM  VAD Team --- VAD ISSUES ONLY--- Pager [phone] (7am - 7am)  Advanced Heart Failure Team  Pager  [phone] (M-F; 7a - 4p)  Please contact [facility] for night-coverage after hours (4p -7a ) and weekends on [email or web address]

## 2015-01-31 NOTE — Anesthesia Procedure Notes (Signed)
Procedure Name: MAC Date/Time: 01/31/2015 4:28 PM Performed by: Jacquiline Doe A Pre-anesthesia Checklist: Patient identified, Emergency Drugs available, Suction available, Patient being monitored and Timeout performed Patient Re-evaluated:Patient Re-evaluated prior to inductionOxygen Delivery Method: Nasal cannula Intubation Type: IV induction Placement Confirmation: positive ETCO2 Dental Injury: Teeth and Oropharynx as per pre-operative assessment

## 2015-01-31 NOTE — Progress Notes (Signed)
ANTICOAGULATION CONSULT NOTE - Follow Up Consult  Pharmacy Consult for Coumadin Indication: LVAD  Allergies  Allergen Reactions  . Hydralazine Other (See Comments)    Severe headaches     Patient Measurements: Height: 5\' 7"  (170.2 cm) Weight: 138 lb 14.2 oz (63 kg) IBW/kg (Calculated) : 66.1  Vital Signs: Temp: 102.9 F (39.4 C) (07/23 1524) Temp Source: Oral (07/23 1524) BP: 88/47 mmHg (07/23 1524) Pulse Rate: 77 (07/23 1524)  Labs:  Recent Labs  01/29/15 0244 01/30/15 0358 01/30/15 0944 01/31/15 0225  HGB 9.9*  --   --   --   HCT 29.4*  --   --   --   PLT 238  --   --   --   LABPROT 30.0* 34.2*  --  25.9*  INR 2.92* 3.48*  --  2.41*  CREATININE 0.90  --  1.12 1.05    Estimated Creatinine Clearance: 73.3 mL/min (by C-G formula based on Cr of 1.05).  Assessment: 53yom on coumadin pta for his LVAD (10/13/2014), admitted s/p fall (did not hit his head). INR therapeutic on admission and coumadin resumed. INR today is supratherapeutic at 2.41 down from 3.48- aiming for a lower INR goal of 1.7-2.2 due to recent ICH (11/24/14).  Big drop with hold concerned that if held again would drop too low.  Will dose conservatively  CBC, LDH are stable. No bleeding reported.  Goal of Therapy:  INR 1.7-2.2 Monitor platelets by anticoagulation protocol: Yes   Plan:  1)  coumadin 1mg  tonight 2) INR in AM  [place].D. CPP, BCPS Clinical Pharmacist [phone] 01/31/2015 3:59 PM

## 2015-02-01 ENCOUNTER — Inpatient Hospital Stay (HOSPITAL_COMMUNITY): Payer: Medicare Other

## 2015-02-01 LAB — CBC
HCT: 29.2 % — ABNORMAL LOW (ref 39.0–52.0)
Hemoglobin: 9.8 g/dL — ABNORMAL LOW (ref 13.0–17.0)
MCH: 28.3 pg (ref 26.0–34.0)
MCHC: 33.6 g/dL (ref 30.0–36.0)
MCV: 84.4 fL (ref 78.0–100.0)
PLATELETS: 274 10*3/uL (ref 150–400)
RBC: 3.46 MIL/uL — AB (ref 4.22–5.81)
RDW: 14.2 % (ref 11.5–15.5)
WBC: 9.7 10*3/uL (ref 4.0–10.5)

## 2015-02-01 LAB — BASIC METABOLIC PANEL
ANION GAP: 7 (ref 5–15)
BUN: 12 mg/dL (ref 6–20)
CALCIUM: 8.7 mg/dL — AB (ref 8.9–10.3)
CHLORIDE: 98 mmol/L — AB (ref 101–111)
CO2: 28 mmol/L (ref 22–32)
CREATININE: 1.06 mg/dL (ref 0.61–1.24)
GFR calc Af Amer: >60 mL/min (ref >60)
Glucose, Bld: 166 mg/dL — ABNORMAL HIGH (ref 65–99)
POTASSIUM: 4.1 mmol/L (ref 3.5–5.1)
SODIUM: 133 mmol/L — AB (ref 135–145)

## 2015-02-01 LAB — PROTIME-INR
INR: 2.19 — AB (ref 0.00–1.49)
PROTHROMBIN TIME: 24.1 s — AB (ref 11.6–15.2)

## 2015-02-01 LAB — GLUCOSE, CAPILLARY
GLUCOSE-CAPILLARY: 175 mg/dL — AB (ref 65–99)
GLUCOSE-CAPILLARY: 257 mg/dL — AB (ref 65–99)
GLUCOSE-CAPILLARY: 194 mg/dL — AB (ref 65–99)
GLUCOSE-CAPILLARY: 211 mg/dL — AB (ref 65–99)

## 2015-02-01 LAB — LACTATE DEHYDROGENASE: LDH: 344 U/L — ABNORMAL HIGH (ref 98–192)

## 2015-02-01 MED ORDER — WARFARIN SODIUM 2 MG PO TABS
2.0000 mg | ORAL_TABLET | Freq: Every day | ORAL | Status: DC
  Administered 2015-02-02: 2 mg via ORAL
  Filled 2015-02-01 (×3): qty 1

## 2015-02-01 NOTE — Progress Notes (Addendum)
HeartMate 2 Rounding Note  Subjective:    Admitted after fall and failure to thrive and paroxysmal fevers. Recent 30 pound weight loss and anorexia. ESR 108. CT C/A/P no evidence of recurrent lymphoma or abscess. LDH stable ~ 300. VAD speed turned down to 8600 yesterday.  TEE 7/23 no vegetations  Had fever again today to 102 otherwise feels well. Bcx remain negative. C/o R heel pain.    LVAD INTERROGATION:  HeartMate II LVAD:  Flow 4.0 liters/min, speed 8600, power 4.3, PI 6.1  Numerous PI events   Objective:    Vital Signs:   Temp:  [97.3 F (36.3 C)-102.9 F (39.4 C)] 97.3 F (36.3 C) (07/24 1135) Pulse Rate:  [73-89] 89 (07/23 2351) Resp:  [16-24] 20 (07/24 1135) BP: (78-137)/(47-110) 81/61 mmHg (07/24 0820) SpO2:  [98 %-100 %] 98 % (07/24 1135) Weight:  [61.5 kg (135 lb 9.3 oz)] 61.5 kg (135 lb 9.3 oz) (07/24 0500) Last BM Date: 01/29/15 Mean arterial Pressure 70-80s  Intake/Output:   Intake/Output Summary (Last 24 hours) at 02/01/15 1141 Last data filed at 02/01/15 0900  Gross per 24 hour  Intake    540 ml  Output    350 ml  Net    190 ml     Physical Exam: General:  Sitting up in chair HEENT: normal Neck: supple. JVP 8  Carotids 2+ bilat; no bruits. No lymphadenopathy or thryomegaly appreciated. Cor: Mechanical heart sounds with LVAD hum present. Lungs: clear Abdomen: soft, nontender, nondistended. No hepatosplenomegaly. No bruits or masses. Good bowel sounds. Driveline: C/D/I; securement device intact and driveline incorporated Extremities: no cyanosis, clubbing, rash, edema extreme pinpoint tenderness of right heel Neuro: alert & orientedx3, cranial nerves grossly intact. moves all 4 extremities w/o difficulty. Affect flat  Telemetry: SR 80s   Labs: Basic Metabolic Panel:  Recent Labs Lab 01/26/15 1045 01/28/15 1417 01/29/15 0244 01/30/15 0944 01/31/15 0225 02/01/15 0258  NA 129* 128* 132* 130* 130* 133*  K 4.0 4.0 3.7 4.5 3.5 4.1  CL 93* 94*  99* 96* 95* 98*  CO2 $Re'24 23 26 26 26 28  'ElC$ GLUCOSE 151* 214* 200* 225* 202* 166*  BUN $Re'13 19 11 10 9 12  'djR$ CREATININE 1.36* 1.09 0.90 1.12 1.05 1.06  CALCIUM 9.4 9.1 8.7* 9.0 8.7* 8.7*  MG 2.0  --   --   --   --   --     Liver Function Tests:  Recent Labs Lab 01/26/15 1045 01/29/15 0244 01/30/15 0944  AST 81* 87* 122*  ALT 61 63 80*  ALKPHOS 140* 130* 149*  BILITOT 1.0 0.6 0.8  PROT 8.2* 6.8 7.9  ALBUMIN 4.1 3.0* 3.5   No results for input(s): LIPASE, AMYLASE in the last 168 hours. No results for input(s): AMMONIA in the last 168 hours.  CBC:  Recent Labs Lab 01/26/15 1045 01/28/15 1417 01/29/15 0244 02/01/15 0258  WBC 9.2 12.3* 10.9* 9.7  NEUTROABS  --   --  8.2*  --   HGB 12.4* 11.2* 9.9* 9.8*  HCT 36.3* 33.1* 29.4* 29.2*  MCV 85.2 84.9 84.5 84.4  PLT 304 256 238 274    INR:  Recent Labs Lab 01/28/15 1417 01/29/15 0244 01/30/15 0358 01/31/15 0225 02/01/15 0258  INR 2.48* 2.92* 3.48* 2.41* 2.19*    Other results:    Imaging: Ct Head Wo Contrast  01/30/2015   CLINICAL DATA:  Previous stroke with right-sided weakness  EXAM: CT HEAD WITHOUT CONTRAST  TECHNIQUE: Contiguous axial images were obtained from  the base of the skull through the vertex without intravenous contrast.  COMPARISON:  01/12/2015  FINDINGS: The bony calvarium is intact. There are changes consistent with prior ischemic event in the left basal ganglia with encephalomalacia changes. Changes in the posterior right temporal lobe are also noted consistent with mild encephalomalacia. No findings to suggest acute hemorrhage, acute infarction or space-occupying mass lesion is identified.  IMPRESSION: Chronic ischemic changes without acute abnormality.   Electronically Signed   By: Inez Catalina M.D.   On: 01/30/2015 14:49     Medications:     Scheduled Medications: . amiodarone  200 mg Oral Daily  . amLODipine  2.5 mg Oral Daily  . atorvastatin  20 mg Oral QHS  . feeding supplement (ENSURE  ENLIVE)  237 mL Oral TID BM  . insulin aspart  0-9 Units Subcutaneous TID WC  . insulin glargine  5 Units Subcutaneous QHS  . magnesium oxide  400 mg Oral Daily  . methocarbamol  500 mg Oral 4 times per day  . mirtazapine  7.5 mg Oral QHS  . pantoprazole  40 mg Oral Daily  . Warfarin - Pharmacist Dosing Inpatient   Does not apply q1800    Infusions:    PRN Medications: acetaminophen, albuterol, bisacodyl, midazolam   Assessment:   1. Fever and failure to thrive 2. Chronic systolic HF - Status post LVAD implantation for DT.  - 7/22 Speed turned down to 8600.  - 7/24 speed turned down to 8400 3. H/o Non Hodgkins Lymphoma s/p therapy- CT chest/abd/pelvis ok.  4.  H/O CVA 2008  5. H/O VT/VF --St Jude ICD 2010 - Interrogated device. No arrhythmias noted.   6. DMII- not on any meds. Glucose elevated on BMET.  Start checking CBGs.  7. 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 8. Recent ICH 5/16.  -stable no new neuro deficits. Head CT - no recurrence 9. Hyponatremia 10. Severe malnutrition in context of chronic illness 11. R heel pain  He continues with intermittent fevers. ESR 108. [place] remain negative. CT C/A/P no evidence of recurrent lymphoma or abscess. LDH stable ~ 300. ESR 108. TEE without vegetations. I spoke with Dr. Johnnye Sima in [place] who agreed with holding abx. Will get plain films of R foot and also check uric acid to evaluate heel pain. Check ehrlichiosis and RMSF. Will ask ID and heme to see tomorrow.   Numerous PI events. EF 40-45% on TEE. VAD speed turned down earlier this admit. Multiple PI events again overnight. Will turn speed down to 8400.   Ok to go off floor.   I reviewed the LVAD parameters from today, and compared the results to the patient's prior recorded data.  No programming changes were made.  The LVAD is functioning within specified parameters.  The patient performs LVAD self-test daily.  LVAD interrogation was negative for any significant  power changes, alarms or PI events/speed drops.  LVAD equipment check completed and is in good working order.  Back-up equipment present.   LVAD education done on emergency procedures and precautions and reviewed exit site care.  Length of Stay: 4  Athene Schuhmacher MD 02/01/2015, 11:41 AM  VAD Team --- VAD ISSUES ONLY--- Pager [phone] (7am - 7am)  Advanced Heart Failure Team  Pager [phone] (M-F; 7a - 4p)  Please contact [facility] for night-coverage after hours (4p -7a ) and weekends on [email or web address]

## 2015-02-01 NOTE — Progress Notes (Signed)
ANTICOAGULATION CONSULT NOTE - Follow Up Consult  Pharmacy Consult for Coumadin Indication: LVAD  Allergies  Allergen Reactions  . Hydralazine Other (See Comments)    Severe headaches     Patient Measurements: Height: 5\' 7"  (170.2 cm) Weight: 135 lb 9.3 oz (61.5 kg) IBW/kg (Calculated) : 66.1  Vital Signs: Temp: 97.3 F (36.3 C) (07/24 1135) Temp Source: Oral (07/24 1135) BP: 81/61 mmHg (07/24 0820)  Labs:  Recent Labs  01/30/15 0358 01/30/15 0944 01/31/15 0225 02/01/15 0258  HGB  --   --   --  9.8*  HCT  --   --   --  29.2*  PLT  --   --   --  274  LABPROT 34.2*  --  25.9* 24.1*  INR 3.48*  --  2.41* 2.19*  CREATININE  --  1.12 1.05 1.06    Estimated Creatinine Clearance: 70.9 mL/min (by C-G formula based on Cr of 1.06).  Assessment: 53yom on coumadin pta for his LVAD (10/13/2014), admitted s/p fall (did not hit his head). INR therapeutic on admission and coumadin resumed. INR today is therapeutic at 2.1 down from 3.48- aiming for a lower INR goal of 1.7-2.2 due to recent ICH (11/24/14).    Will dose conservatively - seems to be more sensitive to warf than pta  CBC, LDH are stable. No bleeding reported.  Goal of Therapy:  INR 1.7-2.2 Monitor platelets by anticoagulation protocol: Yes   Plan:  1)  coumadin 2mg  daily 2) INR in AM  [place].D. CPP, BCPS Clinical Pharmacist [phone] 02/01/2015 1:07 PM

## 2015-02-02 ENCOUNTER — Ambulatory Visit (HOSPITAL_COMMUNITY): Payer: Medicare Other

## 2015-02-02 ENCOUNTER — Encounter (HOSPITAL_COMMUNITY): Payer: Self-pay | Admitting: Internal Medicine

## 2015-02-02 DIAGNOSIS — R627 Adult failure to thrive: Secondary | ICD-10-CM

## 2015-02-02 DIAGNOSIS — Z95818 Presence of other cardiac implants and grafts: Secondary | ICD-10-CM

## 2015-02-02 DIAGNOSIS — R634 Abnormal weight loss: Secondary | ICD-10-CM

## 2015-02-02 DIAGNOSIS — Z8579 Personal history of other malignant neoplasms of lymphoid, hematopoietic and related tissues: Secondary | ICD-10-CM

## 2015-02-02 DIAGNOSIS — D649 Anemia, unspecified: Secondary | ICD-10-CM

## 2015-02-02 DIAGNOSIS — R509 Fever, unspecified: Secondary | ICD-10-CM

## 2015-02-02 LAB — BASIC METABOLIC PANEL
ANION GAP: 8 (ref 5–15)
BUN: 15 mg/dL (ref 6–20)
CALCIUM: 8.7 mg/dL — AB (ref 8.9–10.3)
CHLORIDE: 97 mmol/L — AB (ref 101–111)
CO2: 26 mmol/L (ref 22–32)
Creatinine, Ser: 0.91 mg/dL (ref 0.61–1.24)
GFR calc Af Amer: >60 mL/min (ref >60)
GFR calc non Af Amer: >60 mL/min (ref >60)
Glucose, Bld: 249 mg/dL — ABNORMAL HIGH (ref 65–99)
POTASSIUM: 4 mmol/L (ref 3.5–5.1)
Sodium: 131 mmol/L — ABNORMAL LOW (ref 135–145)

## 2015-02-02 LAB — PROTIME-INR
INR: 1.98 — ABNORMAL HIGH (ref 0.00–1.49)
PROTHROMBIN TIME: 22.4 s — AB (ref 11.6–15.2)

## 2015-02-02 LAB — GLUCOSE, CAPILLARY
Glucose-Capillary: 210 mg/dL — ABNORMAL HIGH (ref 65–99)
Glucose-Capillary: 272 mg/dL — ABNORMAL HIGH (ref 65–99)
Glucose-Capillary: 272 mg/dL — ABNORMAL HIGH (ref 65–99)
Glucose-Capillary: 144 mg/dL — ABNORMAL HIGH (ref 65–99)

## 2015-02-02 LAB — ANTINUCLEAR ANTIBODIES, IFA: ANTINUCLEAR ANTIBODIES, IFA: NEGATIVE

## 2015-02-02 LAB — URIC ACID: Uric Acid, Serum: 2.1 mg/dL — ABNORMAL LOW (ref 4.4–7.6)

## 2015-02-02 LAB — LACTATE DEHYDROGENASE: LDH: 359 U/L — AB (ref 98–192)

## 2015-02-02 NOTE — Progress Notes (Signed)
Pt walking with PT. Will f/u tomorrow. Yves Dill CES, ACSM 2:45 PM 02/02/2015

## 2015-02-02 NOTE — Progress Notes (Signed)
Utilization Review Completed.Andre Tran T7/25/2016  

## 2015-02-02 NOTE — Care Management Important Message (Signed)
Important Message  Patient Details  Name: Andre Tran MRN: [id number] Date of Birth: April 26, 1961   Medicare Important Message Given:  Yes-second notification given    Pricilla Handler 02/02/2015, 2:18 PM

## 2015-02-02 NOTE — Consult Note (Signed)
Reason for consult: History of lymphoma. Anemia and failure to thrive.   HPI: [name] is a 54 year old gentleman known to me with a long-standing history of treated lymphoma. He initially diagnosed in 1993 and subsequently had another lymphoma in 2007. He had been treated extensively with systemic chemotherapy predominantly Adriamycin-based regimen and have been in remission since 2007. He developed cardiomyopathy and underwent an LVAD placement in April 2016. He was hospitalized on 01/28/2015 after presenting with left hip pain, failure to thrive, poor appetite and weight loss. He endorses close to 40 pound weight loss last 6 months. His workup so far have been unrevealing for any exact etiology. His blood cultures did not show any growth on 01/26/2015. Blood culture from 01/30/2015 are currently pending. Laboratory testing, showed that he had an elevated sedimentation rate up to 108 on 01/30/2015 and previously was up to 45 on May 2016. He was noted to be mildly anemic with a hemoglobin ranged between 9 and 10 since February 2016. His white cell count have been normal with a normal differential. His platelet count did drop transiently during his LVAD procedure in April 2016. He did have a CT scan of the chest abdomen and pelvis which did not show any lymphadenopathy.  Clinically, he gives a rather vague history in terms of symptoms. He endorses low grade fevers and chills. He does report poor appetite but seems to be improving. He did endorse the weight loss as mentioned. He does not report any headaches, blurry vision, syncope or seizures. He does not report any night sweats or pruritus. He does not report any chest pain, palpitation, orthopnea or leg edema. He does not report any cough, hemoptysis or hematemesis. He does not report any wheezing or dyspnea on exertion. He does not report any nausea, vomiting, abdominal pain, hematochezia or melanoma. He does not report any frequency, urgency or hesitancy.  He does report left hip pain but no other skeletal complaints. Remaining review of systems unremarkable.   Past Medical History  Diagnosis Date  . Paroxysmal ventricular tachycardia 2005, 2010    s/p AICD '05, replaced w/ St. Jude's in 2010 (PVT/V.Fib arrest requiring ICD implant in 2005)  . HYPERTENSION, UNSPECIFIED   . HYPERLIPIDEMIA-MIXED   . 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 Nonischemic cardiomyopathy     2/2 Adriamycin administration for lymphoma  . CHF (congestive heart failure)     EF 15% by echo 2009; s/p St. Jude ICD  . Diabetes mellitus     Type 2  . Cancer 1992, 2007     non hodkins lymphoma 1992, Hodgkins 2007  . Depression   . LV (left ventricular) mural thrombus 2009    2009, on coumadin  . Small bowel obstruction 2001    s/p Small bowel resection 2001  . Jejunal intussusception 2008    2008  . 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 Splenic mass 2009    Noted on Abd [place] 2009 w/ recs for f/u CT - not done  . Hypotension   . Noncompliance with medication regimen   . AICD (automatic cardioverter/defibrillator) present 2005, 2010  . Presence of permanent cardiac pacemaker   :  Past Surgical History  Procedure Laterality Date  . Cardiac defibrillator placement      2005, replaced w/ St. Jude's 2010  . Vasectomy    .  Bowel resection      small bowell 2/2 obstruction 2001  . Pacemaker insertion    . Insert / replace / remove pacemaker    . Colonoscopy N/A 09/18/2014    Procedure: COLONOSCOPY;  Surgeon: [name], MD;  Location[facility] ENDOSCOPY;  Service: Endoscopy;  Laterality: N/A;  . Esophagogastroduodenoscopy N/A 09/18/2014    Procedure: ESOPHAGOGASTRODUODENOSCOPY (EGD);  Surgeon: [name], MD;  Location: [facility] ENDOSCOPY;  Service: Endoscopy;  Laterality: N/A;  . Left and right heart catheterization with  coronary angiogram N/A 09/19/2014    Procedure: LEFT AND RIGHT HEART CATHETERIZATION WITH CORONARY ANGIOGRAM;  Surgeon: Jolaine Artist, MD;  Location: [facility] CATH LAB;  Service: Cardiovascular;  Laterality: N/A;  . Colonoscopy N/A 09/19/2014    Procedure: COLONOSCOPY;  Surgeon: [name], MD;  Location: [place];  Service: Gastroenterology;  Laterality: N/A;  . Insertion of implantable left ventricular assist device N/A 10/13/2014    Procedure: INSERTION OF IMPLANTABLE LEFT VENTRICULAR ASSIST DEVICE;  Surgeon: [name], MD;  Location: [place];  Service: Open Heart Surgery;  Laterality: N/A;  CIRC ARREST  NITRIC OXIDE  . Tee without cardioversion N/A 10/13/2014    Procedure: TRANSESOPHAGEAL ECHOCARDIOGRAM (TEE);  Surgeon: [name], MD;  Location: [place];  Service: Open Heart Surgery;  Laterality: N/A;  . Exploration post operative open heart N/A 10/13/2014    Procedure: EXPLORATION POST OPERATIVE OPEN HEART;  Surgeon: [name], MD;  Location: [place] OR;  Service: Open Heart Surgery;  Laterality: N/A;  . Cardioversion N/A 01/30/2015    Procedure: CARDIOVERSION;  Surgeon: Jolaine Artist, MD;  Location: [facility] OR;  Service: Cardiovascular;  Laterality: N/A;  :   Current facility-administered medications:  .  acetaminophen (TYLENOL) tablet 650 mg, 650 mg, Oral, Q8H PRN, Jolaine Artist, MD, 650 mg at 02/02/15 0211 .  albuterol (PROVENTIL) (2.5 MG/3ML) 0.083% nebulizer solution 2.5 mg, 2.5 mg, Nebulization, Q6H PRN, Jolaine Artist, MD .  amiodarone (PACERONE) tablet 200 mg, 200 mg, Oral, Daily, Amy D Clegg, NP, 200 mg at 02/01/15 0944 .  amLODipine (NORVASC) tablet 2.5 mg, 2.5 mg, Oral, Daily, Amy D Clegg, NP, 2.5 mg at 02/01/15 0944 .  atorvastatin (LIPITOR) tablet 20 mg, 20 mg, Oral, QHS, Amy D Clegg, NP, 20 mg at 02/01/15 2200 .  bisacodyl (DULCOLAX) EC tablet 5 mg, 5 mg, Oral, Daily PRN, Amy D Clegg, NP .  feeding supplement (ENSURE ENLIVE) (ENSURE ENLIVE) liquid 237 mL, 237 mL,  Oral, TID BM, Heather C Pitts, RD, 237 mL at 02/01/15 1000 .  insulin aspart (novoLOG) injection 0-9 Units, 0-9 Units, Subcutaneous, TID WC, Amy D Clegg, NP, 3 Units at 02/02/15 0842 .  insulin glargine (LANTUS) injection 5 Units, 5 Units, Subcutaneous, QHS, Amy D Clegg, NP, 5 Units at 02/01/15 2200 .  magnesium oxide (MAG-OX) tablet 400 mg, 400 mg, Oral, Daily, Jolaine Artist, MD, 400 mg at 02/01/15 0946 .  methocarbamol (ROBAXIN) tablet 500 mg, 500 mg, Oral, 4 times per day, Amy D Clegg, NP, 500 mg at 02/02/15 0605 .  midazolam (VERSED) injection 1 mg, 1 mg, Intravenous, PRN, Annye Asa, MD, 1 mg at 01/31/15 1628 .  mirtazapine (REMERON) tablet 7.5 mg, 7.5 mg, Oral, QHS, Amy D Clegg, NP, 7.5 mg at 02/01/15 2200 .  pantoprazole (PROTONIX) EC tablet 40 mg, 40 mg, Oral, Daily, Amy D Clegg, NP, 40 mg at 02/01/15 0944 .  warfarin (COUMADIN) tablet 2 mg, 2 mg, Oral, q1800, Quillian Quince  Karlyn Agee, MD .  Warfarin - Pharmacist Dosing Inpatient, , Does not apply, q1800, Lyndee Leo, [facility], Stopped at 01/30/15 1800:  Allergies  Allergen Reactions  . Hydralazine Other (See Comments)    Severe headaches   :  Family History  Problem Relation Age of Onset  . Other      No known family h/o heart disease  . Heart disease    . Diabetes Mother   . Other Other     complications from hip replacement  :  History   Social History  . Marital Status: Married    Spouse Name: N/A  . Number of Children: N/A  . Years of Education: N/A   Occupational History  . Not on file.   Social History Main Topics  . Smoking status: Never Smoker   . Smokeless tobacco: Never Used  . Alcohol Use: 1.2 oz/week    1 Cans of beer, 1 Shots of liquor per week     Comment: ON SPECIAL OCCASIONS  . Drug Use: No  . Sexual Activity: No   Other Topics Concern  . Not on file   Social History Narrative  :  Pertinent items are noted in HPI.  Exam: Blood pressure 81/61, pulse 74, temperature 98 F (36.7 C),  temperature source Oral, resp. rate 20, height _0  (1.702 m), weight 139 lb 1.8 oz (63.1 kg), SpO2 100 %. General appearance: alert and cooperative Head: Normocephalic, without obvious abnormality Nose: Nares normal. Septum midline. Mucosa normal. No drainage or sinus tenderness. Back: symmetric, no curvature. ROM normal. No CVA tenderness. Resp: clear to auscultation bilaterally Chest wall: no tenderness Cardio: regular rate and rhythm, S1, S2 normal, no murmur, click, rub or gallop GI: soft, non-tender; bowel sounds normal; no masses,  no organomegaly Extremities: extremities normal, atraumatic, no cyanosis or edema Pulses: 2+ and symmetric Skin: Skin color, texture, turgor normal. No rashes or lesions   Recent Labs  02/01/15 0258  WBC 9.7  HGB 9.8*  HCT 29.2*  PLT 274    Recent Labs  02/01/15 0258 02/02/15 0235  NA 133* 131*  K 4.1 4.0  CL 98* 97*  CO2 28 26  GLUCOSE 166* 249*  BUN 12 15  CREATININE 1.06 0.91  CALCIUM 8.7* 8.7*      Ct Abdomen Pelvis Wo Contrast  01/28/2015   CLINICAL DATA:  54 year old male with fall.  EXAM: CT CHEST WITHOUT CONTRAST  TECHNIQUE: Multidetector CT imaging of the chest was performed following the standard protocol without IV contrast.  COMPARISON:  CT dated 06/20/2014  FINDINGS: Evaluation of this exam is limited in the absence of intravenous contrast. Evaluation is also limited due to streak artifact caused by cardiac support devices.  The lungs are clear. No pleural effusion or pneumothorax. The central airways are patent.  The thoracic aorta appears grossly unremarkable. There is aortoiliac atherosclerotic disease. There is postsurgical changes of open heart surgery with left pectoral pacemaker device and an implantable left ventricular assist device. No cardiomegaly. No significant pericardial effusion. Visualized heart anterior chest wall incisional scar. LVAD support wires noted in the anterior right abdominal wall.  No  intra-abdominal free air or free fluid. The liver, gallbladder, pancreas, spleen, adrenal glands, kidneys, visualized ureters, and urinary bladder appear unremarkable. Small right renal upper pole hypodense lesion is not well characterized but likely represents a cyst. The prostate and seminal vesicles appear unremarkable.  Constipation with no evidence of bowel obstruction or inflammation. Normal appendix.  There is no  adenopathy. No portal venous gas identified. There is focal induration of the periumbilical subcutaneous soft tissue likely related to recent port insertion. No drainable fluid collection/abscess identified. There are stable sclerotic changes of T12-L2. No acute fracture.  IMPRESSION: No acute intrathoracic, abdominal, or pelvic pathology.  Cardiac pacemaker device and left ventricular assist. No significant pericardial effusion. Drainable fluid collection/ abscess.  Stable T12-L2 sclerotic changes.   Electronically Signed   By: Anner Crete M.D.   On: 01/28/2015 21:09     Ct Head Wo Contrast  01/30/2015   CLINICAL DATA:  Previous stroke with right-sided weakness  EXAM: CT HEAD WITHOUT CONTRAST  TECHNIQUE: Contiguous axial images were obtained from the base of the skull through the vertex without intravenous contrast.  COMPARISON:  01/12/2015  FINDINGS: The bony calvarium is intact. There are changes consistent with prior ischemic event in the left basal ganglia with encephalomalacia changes. Changes in the posterior right temporal lobe are also noted consistent with mild encephalomalacia. No findings to suggest acute hemorrhage, acute infarction or space-occupying mass lesion is identified.  IMPRESSION: Chronic ischemic changes without acute abnormality.   Electronically Signed   By: Inez Catalina M.D.   On: 01/30/2015 14:49   Ct Head Wo Contrast  01/12/2015   CLINICAL DATA:  Headache for 1 week, history stroke, LVAD, hypertension, non ischemic cardiomyopathy, CHF, diabetes mellitus,  lymphoma in remission  EXAM: CT HEAD WITHOUT CONTRAST  TECHNIQUE: Contiguous axial images were obtained from the base of the skull through the vertex without intravenous contrast.  COMPARISON:  01/28/2015  FINDINGS: Generalized atrophy.  Stable ex vacuo dilatation of the LEFT lateral ventricle.  Large old LEFT basal ganglia infarct extending into external capsule.  Small LEFT thalamus.  Mild small vessel chronic ischemic changes of deep cerebral white matter.  High attenuation hemorrhage and edema seen previously at the RIGHT temporal lobe has resolved, with residual infarct at site.  No recurrent intracranial hemorrhage, mass lesion or additional acute infarct identified.  No extra-axial fluid collections.  Posterior fossa unremarkable.  Visualized paranasal sinuses and mastoid air cells clear.  No acute osseous findings.  Minimal atherosclerotic calcification at the carotid siphons.  IMPRESSION: Old LEFT basal ganglia and RIGHT temporal lobe infarcts.  Resolution of high attenuation RIGHT temporal lobe hematoma and edema since 12/05/2014.  No new intracranial abnormalities.   Electronically Signed   By: Lavonia Dana M.D.   On: 01/12/2015 08:59   Ct Chest Wo Contrast  01/28/2015   CLINICAL DATA:  54 year old male with fall.  EXAM: CT CHEST WITHOUT CONTRAST  TECHNIQUE: Multidetector CT imaging of the chest was performed following the standard protocol without IV contrast.  COMPARISON:  CT dated 06/20/2014  FINDINGS: Evaluation of this exam is limited in the absence of intravenous contrast. Evaluation is also limited due to streak artifact caused by cardiac support devices.  The lungs are clear. No pleural effusion or pneumothorax. The central airways are patent.  The thoracic aorta appears grossly unremarkable. There is aortoiliac atherosclerotic disease. There is postsurgical changes of open heart surgery with left pectoral pacemaker device and an implantable left ventricular assist device. No cardiomegaly. No  significant pericardial effusion. Visualized heart anterior chest wall incisional scar. LVAD support wires noted in the anterior right abdominal wall.  No intra-abdominal free air or free fluid. The liver, gallbladder, pancreas, spleen, adrenal glands, kidneys, visualized ureters, and urinary bladder appear unremarkable. Small right renal upper pole hypodense lesion is not well characterized but likely represents a cyst. The  prostate and seminal vesicles appear unremarkable.  Constipation with no evidence of bowel obstruction or inflammation. Normal appendix.  There is no adenopathy. No portal venous gas identified. There is focal induration of the periumbilical subcutaneous soft tissue likely related to recent port insertion. No drainable fluid collection/abscess identified. There are stable sclerotic changes of T12-L2. No acute fracture.  IMPRESSION: No acute intrathoracic, abdominal, or pelvic pathology.  Cardiac pacemaker device and left ventricular assist. No significant pericardial effusion. Drainable fluid collection/ abscess.  Stable T12-L2 sclerotic changes.   Electronically Signed   By: Anner Crete M.D.   On: 01/28/2015 21:09      Assessment and Plan:    54 year old gentleman with the following issues:  1. Constellation of symptoms that includes weight loss, failure to thrive and elevated sedimentation rate of 108. His workup so far has been unrevealing for any infectious etiology. CT scan images did not show any evidence of lymphadenopathy. I was asked by Dr. Haroldine Laws to comment about these findings from a hematological standpoint.  It is very unlikely for him to have a recurrent lymphoma without any lymphadenopathy on his imaging studies. He does have elevated LDH and sedimentation rate which really nonspecific for hematological disorder.  He does have mild anemia and have been heavily pretreated with chemotherapy and radiation in the past. This can certainly cause bone marrow damage  and make him susceptible for developing leukemia. He could also be developing a plasma cell disorder such as multiple myeloma which is unlikely.  I feel that a bone marrow biopsy is a reasonable test given these findings and I discussed that with him today. Complications from this procedure include bleeding, infection, pain among others.  If his bone marrow biopsy did not show any evidence of acute leukemia, it is reasonable to conclude that his symptoms are not related to hematological or malignant disorder.  2. Normocytic normochromic anemia: Differential diagnosis includes anemia of chronic disease as well as a bone marrow disorder as mentioned above. These conditions including MDS, plasma cell disorder and less likely leukemia.  I will obtain a serum protein electrophoresis for completeness sake and proceed with a bone marrow biopsy as discussed above.  Once these results are back, I will give my final recommendations.

## 2015-02-02 NOTE — Progress Notes (Signed)
Physical Therapy Treatment Patient Details Name: Andre Tran MRN: [id number] DOB: [birth year] Today's Date: 02/02/2015    History of Present Illness Pt admitted after 2 falls at home  with left hip pain negative for fx. 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    PT Comments    Pt with heel pain at site of plantar fascia origin, educated for stretching and massage for possible fasciitis. Pt educated for RW use to off load RLE and bil LE HEP to maintain strength while not at baseline ambulation distance. Pt with difficulty standing to use urinal with assist for linen change. Max assist for power transfer to and from battery today per pt request. Will follow. Recommend daily mobility with nursing.   Follow Up Recommendations  No PT follow up;Other (comment)     Equipment Recommendations       Recommendations for Other Services       Precautions / Restrictions Precautions Precautions: Fall Precaution Comments: LVAD    Mobility  Bed Mobility Overal bed mobility: Modified Independent                Transfers Overall transfer level: Modified independent                  Ambulation/Gait Ambulation/Gait assistance: Supervision Ambulation Distance (Feet): 160 Feet Assistive device: Rolling walker (2 wheeled) Gait Pattern/deviations: Step-to pattern;Decreased stance time - right   Gait velocity interpretation: Below normal speed for age/gender General Gait Details: cues for sequence as pt with pain at heel limiting standing. With gait reported left hip pain limiting distance   Stairs            Wheelchair Mobility    Modified Rankin (Stroke Patients Only)       Balance Overall balance assessment: Needs assistance   Sitting balance-Leahy Scale: Good       Standing balance-Leahy Scale: Fair                      Cognition Arousal/Alertness: Awake/alert Behavior  During Therapy: WFL for tasks assessed/performed Overall Cognitive Status: No family/caregiver present to determine baseline cognitive functioning Area of Impairment: Problem solving             Problem Solving: Slow processing;Difficulty sequencing      Exercises General Exercises - Lower Extremity Long Arc Quad: AROM;Seated;Both;15 reps Hip Flexion/Marching: AROM;Seated;Both;15 reps    General Comments        Pertinent Vitals/Pain Pain Assessment: 0-10 Pain Score: 3  Pain Location: left hip, right heel Pain Descriptors / Indicators: Aching Pain Intervention(s): Repositioned  HR 81    Home Living                      Prior Function            PT Goals (current goals can now be found in the care plan section) Progress towards PT goals: Not progressing toward goals - comment (limited by pain today)    Frequency  Min 2X/week    PT Plan Current plan remains appropriate    Co-evaluation             End of Session   Activity Tolerance: Patient tolerated treatment well Patient left: in chair;with call bell/phone within reach     Time: 8315-1761 PT Time Calculation (min) (ACUTE ONLY): 30 min  Charges:  $Gait Training: 8-22 mins $Therapeutic  Exercise: 8-22 mins                    G Codes:      Melford Aase 03/04/2015, 2:35 PM Elwyn Reach, [place]

## 2015-02-02 NOTE — Progress Notes (Signed)
HeartMate 2 Rounding Note  Subjective:    Admitted after fall and failure to thrive and paroxysmal fevers. Recent 30 pound weight loss and anorexia. ESR 108. CT C/A/P no evidence of recurrent lymphoma or abscess. LDH stable ~ 300.   7/25 VAD speed turned down to 8400   TEE 7/23 no vegetations  No fever over night. Denies SOB.    LVAD INTERROGATION:  HeartMate II LVAD:  Flow 3.9  liters/min, speed 8400, power 3.9, PI 5.2 ~8 PI  events   Objective:    Vital Signs:   Temp:  [97.3 F (36.3 C)-99.1 F (37.3 C)] 98 F (36.7 C) (07/25 0700) Pulse Rate:  [74] 74 (07/25 0700) Resp:  [12-24] 20 (07/25 0700) SpO2:  [98 %-100 %] 100 % (07/25 0700) Weight:  [139 lb 1.8 oz (63.1 kg)] 139 lb 1.8 oz (63.1 kg) (07/25 0500) Last BM Date: 01/29/15 Mean arterial Pressure 70-80s  Intake/Output:   Intake/Output Summary (Last 24 hours) at 02/02/15 0829 Last data filed at 02/02/15 0200  Gross per 24 hour  Intake    560 ml  Output    450 ml  Net    110 ml     Physical Exam: General:  In bed.  HEENT: normal Neck: supple. JVP 5-6  Carotids 2+ bilat; no bruits. No lymphadenopathy or thryomegaly appreciated. Cor: Mechanical heart sounds with LVAD hum present. Lungs: clear Abdomen: soft, nontender, nondistended. No hepatosplenomegaly. No bruits or masses. Good bowel sounds. Driveline: C/D/I; securement device intact and driveline incorporated Extremities: no cyanosis, clubbing, rash, edema  Neuro: alert & orientedx3, cranial nerves grossly intact. moves all 4 extremities w/o difficulty. Affect flat  Telemetry: SR 80s   Labs: Basic Metabolic Panel:  Recent Labs Lab 01/26/15 1045  01/29/15 0244 01/30/15 0944 01/31/15 0225 02/01/15 0258 02/02/15 0235  NA 129*  < > 132* 130* 130* 133* 131*  K 4.0  < > 3.7 4.5 3.5 4.1 4.0  CL 93*  < > 99* 96* 95* 98* 97*  CO2 24  < > $R'26 26 26 28 26  'WX$ GLUCOSE 151*  < > 200* 225* 202* 166* 249*  BUN 13  < > $R'11 10 9 12 15  'Wj$ CREATININE 1.36*  < > 0.90  1.12 1.05 1.06 0.91  CALCIUM 9.4  < > 8.7* 9.0 8.7* 8.7* 8.7*  MG 2.0  --   --   --   --   --   --   < > = values in this interval not displayed.  Liver Function Tests:  Recent Labs Lab 01/26/15 1045 01/29/15 0244 01/30/15 0944  AST 81* 87* 122*  ALT 61 63 80*  ALKPHOS 140* 130* 149*  BILITOT 1.0 0.6 0.8  PROT 8.2* 6.8 7.9  ALBUMIN 4.1 3.0* 3.5   No results for input(s): LIPASE, AMYLASE in the last 168 hours. No results for input(s): AMMONIA in the last 168 hours.  CBC:  Recent Labs Lab 01/26/15 1045 01/28/15 1417 01/29/15 0244 02/01/15 0258  WBC 9.2 12.3* 10.9* 9.7  NEUTROABS  --   --  8.2*  --   HGB 12.4* 11.2* 9.9* 9.8*  HCT 36.3* 33.1* 29.4* 29.2*  MCV 85.2 84.9 84.5 84.4  PLT 304 256 238 274    INR:  Recent Labs Lab 01/29/15 0244 01/30/15 0358 01/31/15 0225 02/01/15 0258 02/02/15 0235  INR 2.92* 3.48* 2.41* 2.19* 1.98*    Other results:    Imaging: Dg Foot Complete Right  02/01/2015   CLINICAL DATA:  Acute  right heel pain.  No known injuries.  EXAM: RIGHT FOOT COMPLETE - 3+ VIEW  COMPARISON:  None.  FINDINGS: No evidence of plantar calcaneal spur. No visible soft tissue swelling associated with the plantar fascia. Small enthesopathic spur at the insertion of the Achilles tendon on the posterior calcaneus. No evidence of acute fracture or dislocation. Joint spaces well preserved. Well-preserved bone mineral density. No significant intrinsic osseous abnormalities.  IMPRESSION: No significant abnormality.  No evidence of plantar calcaneal spur.   Electronically Signed   By: Evangeline Dakin M.D.   On: 02/01/2015 13:52     Medications:     Scheduled Medications: . amiodarone  200 mg Oral Daily  . amLODipine  2.5 mg Oral Daily  . atorvastatin  20 mg Oral QHS  . feeding supplement (ENSURE ENLIVE)  237 mL Oral TID BM  . insulin aspart  0-9 Units Subcutaneous TID WC  . insulin glargine  5 Units Subcutaneous QHS  . magnesium oxide  400 mg Oral Daily   . methocarbamol  500 mg Oral 4 times per day  . mirtazapine  7.5 mg Oral QHS  . pantoprazole  40 mg Oral Daily  . warfarin  2 mg Oral q1800  . Warfarin - Pharmacist Dosing Inpatient   Does not apply q1800    Infusions:    PRN Medications: acetaminophen, albuterol, bisacodyl, midazolam   Assessment:   1. Fever and failure to thrive 2. Chronic systolic HF - Status post LVAD implantation for DT.  - 7/22 Speed turned down to 8600.  - 7/24 speed turned down to 8400 3. H/o Non Hodgkins Lymphoma s/p therapy- CT chest/abd/pelvis ok.  4.  H/O CVA 2008  5. H/O VT/VF --St Jude ICD 2010 - Interrogated device. No arrhythmias noted.   6. DMII- not on any meds. Glucose elevated on BMET.  Start checking CBGs.  7. 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 8. Recent ICH 5/16.  -stable no new neuro deficits. Head CT - no recurrence 9. Hyponatremia 10. Severe malnutrition in context of chronic illness 11. R heel pain--Xray ok .   He continues with intermittent fevers. No fever over night. ESR 108. [place] remain negative. CT C/A/P no evidence of recurrent lymphoma or abscess. LDH stable 359.  ESR 108.  7/23 TEE without vegetations.   Consult ID and Hem/Onc today.   Uric Acid 2.1 R heel Xray normal.    Check ehrlichiosis and RMSF in process.     EF 40-45% on TEE. VAD speed turned down earlier this admit. Multiple PI events again overnight. Continue speed at 8400.    I reviewed the LVAD parameters from today, and compared the results to the patient's prior recorded data.  No programming changes were made.  The LVAD is functioning within specified parameters.  The patient performs LVAD self-test daily.  LVAD interrogation was negative for any significant power changes, alarms or PI events/speed drops.  LVAD equipment check completed and is in good working order.  Back-up equipment present.   LVAD education done on emergency procedures and precautions and reviewed exit site  care.  Length of Stay: 5  CLEGG,AMY NP-C  02/02/2015, 8:29 AM  VAD Team --- VAD ISSUES ONLY--- Pager [phone] (7am - 7am)  Advanced Heart Failure Team  Pager [phone] (M-F; 7a - 4p)  Please contact [facility] for night-coverage after hours (4p -7a ) and weekends on [email or web address]  Patient seen and examined with Darrick Grinder, NP. We discussed all aspects of the encounter. I  agree with the assessment and plan as stated above.   Afebrile overnight. Feels some better. Cultures remain negative. R foot film ok. Will ask ID and heme/onc to see for further input on FUO. VAD speed adjusted yesterday. Still with multiple PI events.    Bensimhon, Daniel,MD 11:12 AM

## 2015-02-02 NOTE — Progress Notes (Signed)
Results for MCCAULEY, DIEHL (MRN [id number]) as of 02/02/2015 10:29  Ref. Range 02/01/2015 11:35 02/01/2015 16:39 02/01/2015 21:57 02/02/2015 07:26  Glucose-Capillary Latest Ref Range: 65-99 mg/dL 257 (H) 194 (H) 211 (H) 210 (H)  CBGs continue to be elevated. Recommend increasing Lantus dose to 8 units daily and increasing Novolog correction scale to MODERATE TID & HS if CBGs continue to be greater than 180 mg/dl. Harvel Ricks RN BSN CDE

## 2015-02-02 NOTE — Progress Notes (Signed)
ANTICOAGULATION CONSULT NOTE - Follow Up Consult  Pharmacy Consult for Coumadin Indication: LVAD  Allergies  Allergen Reactions  . Hydralazine Other (See Comments)    Severe headaches     Patient Measurements: Height: 5\' 7"  (170.2 cm) Weight: 139 lb 1.8 oz (63.1 kg) IBW/kg (Calculated) : 66.1  Vital Signs: Temp: 98 F (36.7 C) (07/25 0700) Temp Source: Oral (07/25 0700) Pulse Rate: 74 (07/25 0700)  Labs:  Recent Labs  01/31/15 0225 02/01/15 0258 02/02/15 0235  HGB  --  9.8*  --   HCT  --  29.2*  --   PLT  --  274  --   LABPROT 25.9* 24.1* 22.4*  INR 2.41* 2.19* 1.98*  CREATININE 1.05 1.06 0.91    Estimated Creatinine Clearance: 84.7 mL/min (by C-G formula based on Cr of 0.91).  Assessment: 53yom on coumadin pta for his LVAD (10/13/2014), admitted s/p fall (did not hit his head). INR therapeutic on admission and coumadin resumed. INR today remains therapeutic at 1.98 but has trended down. Of note, dose entered for yesterday evening was not charted as given for unknown reason.  Aiming for a lower INR goal of 1.7-2.2 due to recent ICH (11/24/14).  Will continue to dose conservatively - seems to be more sensitive to warf than PTA. CBC, LDH are stable. No bleeding reported.  Goal of Therapy:  INR 1.7-2.2 Monitor platelets by anticoagulation protocol: Yes   Plan:  1) Coumadin 2mg  daily 2) INR in AM  Ambriana Selway K. Velva Harman, PharmD, [place] Clinical Pharmacist Pager: [phone] Pharmacy: [phone] 02/02/2015 11:31 AM

## 2015-02-02 NOTE — Consult Note (Signed)
[facility]    Date of Admission:  01/28/2015   Total days of antibiotics 0       Reason for Consult: Fever in patient with LVAD   Referring Physician: Dr. Jacinto Reap  Principal Problem:   Fever Active Problems:   Chronic systolic heart failure   LVAD (left ventricular assist device) present   Unexplained weight loss   Fall   Failure to thrive (0-17)   FUO (fever of unknown origin)   . amiodarone  200 mg Oral Daily  . amLODipine  2.5 mg Oral Daily  . atorvastatin  20 mg Oral QHS  . feeding supplement (ENSURE ENLIVE)  237 mL Oral TID BM  . insulin aspart  0-9 Units Subcutaneous TID WC  . insulin glargine  5 Units Subcutaneous QHS  . magnesium oxide  400 mg Oral Daily  . methocarbamol  500 mg Oral 4 times per day  . mirtazapine  7.5 mg Oral QHS  . pantoprazole  40 mg Oral Daily  . warfarin  2 mg Oral q1800  . Warfarin - Pharmacist Dosing Inpatient   Does not apply q1800    Recommendations: 1. Continue to hold antibiotics 2. Blood culture with fever 3. TSH and FT4  Agree with assessment by Dr. Melburn Hake.  He has been afebrile for over 24 hours now, no obvious signs.  I agree with checking TSH and FT4 due to amiodarone toxicity.  Also with holding antibiotics pending clinical change or source.  Less likely tick borne related with no significant leukopenia or thrombocytopenia.  Will continue to monitor.   COMER, ROBERT   Assessment: Mr. Sharman is friendly man with a complex medical history of lymphoma, chemo-induced cardiomyopathy, now with an LVAD and a fever of unknown origin. Last week a TEE was negative for vegetations and two sets of blood cultures remain negative to date, thus we have low suspicion for endocarditis. He says he has been experiencing daily fevers for 6 years, but never this high. There are no obvious signs pointing toward infection on history and physical. However conversing with Mr. Corbit is difficult because he seems to  have difficulty finding words. I suspect there may be underlying Wernicke's aphasia from a right temporal lobe ischemic stroke some years ago. He has dry skin on exam, hyporeflexia, and constipation, and he is on amiodarone so it may be worth getting thyroid studies as hypothyroidism can sometimes cause fever. His LDH elevation and anemia may be caused by intravascular hemolysis; although less likely, Erlichiosis can cause relapsing fever and hemolysis. He has no history of tick bites and no hepatosplenomegaly on CT. But it may be worth getting a haptoglobin level to assess for hemolysis. His stroke affected his left basal ganglia and right temporal cortex; his hypoathalamus was not reported to be affected but I considered this to be a potential cause for thermal disregulation. He is also on mirtazapine so I considered serotonin syndrome; however he has no ocular clonus or hyperreflexia on exam. Gout infrequently causes fever and his pain started after his fall so his heel pain is likely trauma-induced. Moreover, the source of his fever eludes me, but there are no obvious signs of infection that warrant initiation of antibiotics at this time.   HPI: TAMI BARREN is a 54 y.o. male with history of lymphoma, adriamycin-induced cardiomyopathy, left-ventricular assist device implanted April 2016, intracerebral hemorrhage, type 2 diabetes mellitus, admitted 7/18 for a syncopal episode  which resulted in a ground-level fall and contused hip. He has lost about 30lbs in the last 6 months and has had daily subjective fevers for the last 6 years since his stroke. He wears a light coat until June of every year. However his fever has recently been worse in the last few weeks. Otherwise, he denies headache, cough, abdominal pain, diarrhea, rash, or muskuloskeletal pain.  Since admission, he has spiked fevers from 102-103 during the day on 7/22 and 7/23. Two sets of blood cultures have been negative (drawn 7/18 and 7/22) and a  transesophageal echocardiogram on 7/23 was negative for valvular vegetation. Oncology saw him and recommended SPEP and bone marrow biopsy to rule out recurrence of his lymphoma. His LDH has been chronically elevated at 350s however abdominal/pelvic/chest CT were negative for lymphadenopathy.   Review of Systems  Constitutional: Positive for fever, chills, weight loss and malaise/fatigue.  HENT: Negative for hearing loss, nosebleeds and sore throat.   Eyes: Negative for blurred vision and pain.  Respiratory: Negative for cough, hemoptysis and sputum production.   Cardiovascular: Negative for chest pain and palpitations.  Gastrointestinal: Negative for nausea, vomiting, abdominal pain and diarrhea.  Genitourinary: Negative for dysuria and urgency.  Musculoskeletal: Positive for back pain, joint pain and falls.  Skin: Negative for itching and rash.  Neurological: Positive for weakness. Negative for headaches.    Past Medical History  Diagnosis Date  . Paroxysmal ventricular tachycardia 2005, 2010    s/p AICD '05, replaced w/ St. Jude's in 2010 (PVT/V.Fib arrest requiring ICD implant in 2005)  . HYPERTENSION, UNSPECIFIED   . HYPERLIPIDEMIA-MIXED   . 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 Nonischemic cardiomyopathy     2/2 Adriamycin administration for lymphoma  . CHF (congestive heart failure)     EF 15% by echo 2009; s/p St. Jude ICD  . Diabetes mellitus     Type 2  . Cancer 1992, 2007     non hodkins lymphoma 1992, Hodgkins 2007  . Depression   . LV (left ventricular) mural thrombus 2009    2009, on coumadin  . Small bowel obstruction 2001    s/p Small bowel resection 2001  . Jejunal intussusception 2008    2008  . 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 Splenic mass 2009    Noted on Abd [place] 2009 w/ recs for f/u CT - not done  .  Hypotension   . Noncompliance with medication regimen   . AICD (automatic cardioverter/defibrillator) present 2005, 2010  . Presence of permanent cardiac pacemaker     History  Substance Use Topics  . Smoking status: Never Smoker   . Smokeless tobacco: Never Used  . Alcohol Use: 1.2 oz/week    1 Cans of beer, 1 Shots of liquor per week     Comment: ON SPECIAL OCCASIONS    Family History  Problem Relation Age of Onset  . Other      No known family h/o heart disease  . Heart disease    . Diabetes Mother   . Other Other     complications from hip replacement   Allergies  Allergen Reactions  . Hydralazine Other (See Comments)    Severe headaches     OBJECTIVE: Blood pressure 81/61, pulse 74, temperature 98 F (36.7 C), temperature source Oral, resp. rate 20,  height '5\' 7"'$  (1.702 m), weight 63.1 kg (139 lb 1.8 oz), SpO2 100 %. General: Lying in bed with covers drawn over head. He sometimes has difficult finding words. Skin: Dry, intact. No rashes. Lungs: Clear to auscultation bilaterally. Cor: Regular rate, LVAD humming. Abdomen: Non-tender to palpation. MSK: Left posterior sacral tuberosity slightly tender to palpation; no obvious erythema or edema. Right calcaneus tender to palpation without visible signs of infection or effusion.  Lab Results Lab Results  Component Value Date   WBC 9.7 02/01/2015   HGB 9.8* 02/01/2015   HCT 29.2* 02/01/2015   MCV 84.4 02/01/2015   PLT 274 02/01/2015    Lab Results  Component Value Date   CREATININE 0.91 02/02/2015   BUN 15 02/02/2015   NA 131* 02/02/2015   K 4.0 02/02/2015   CL 97* 02/02/2015   CO2 26 02/02/2015    Lab Results  Component Value Date   ALT 80* 01/30/2015   AST 122* 01/30/2015   ALKPHOS 149* 01/30/2015   BILITOT 0.8 01/30/2015     Microbiology: Recent Results (from the past 240 hour(s))  Culture, blood (routine x 2)     Status: None   Collection Time: 01/26/15 11:20 AM  Result Value Ref Range Status    Specimen Description BLOOD RIGHT ANTECUBITAL  Final   Special Requests BOTTLES DRAWN AEROBIC ONLY 4CC  Final   Culture NO GROWTH 5 DAYS  Final   Report Status 01/31/2015 FINAL  Final  Culture, blood (routine x 2)     Status: None (Preliminary result)   Collection Time: 01/30/15  4:58 PM  Result Value Ref Range Status   Specimen Description BLOOD RIGHT ANTECUBITAL  Final   Special Requests BOTTLES DRAWN AEROBIC ONLY [place]  Final   Culture NO GROWTH 2 DAYS  Final   Report Status PENDING  Incomplete  Culture, blood (routine x 2)     Status: None (Preliminary result)   Collection Time: 01/30/15  5:10 PM  Result Value Ref Range Status   Specimen Description BLOOD RIGHT ANTECUBITAL  Final   Special Requests BOTTLES DRAWN AEROBIC ONLY 3CC  Final   Culture NO GROWTH 2 DAYS  Final   Report Status PENDING  Incomplete    Loleta Chance, MD [facility] 02/02/2015, 10:20 AM

## 2015-02-02 NOTE — Progress Notes (Signed)
IR team aware of request for bone marrow biopsy. Due to case schedule and limited availability of cyto/path techs needed for procedure, may not be able to accommodate procedure for a few days. Will discuss with ordering MD tomorrow, as he was unavailable today.  Ascencion Dike PA-C Interventional Radiology 02/02/2015 3:33 PM

## 2015-02-03 ENCOUNTER — Encounter (HOSPITAL_COMMUNITY): Payer: Self-pay | Admitting: Radiology

## 2015-02-03 ENCOUNTER — Inpatient Hospital Stay (HOSPITAL_COMMUNITY): Payer: Medicare Other

## 2015-02-03 DIAGNOSIS — D649 Anemia, unspecified: Secondary | ICD-10-CM | POA: Insufficient documentation

## 2015-02-03 LAB — LACTATE DEHYDROGENASE: LDH: 392 U/L — ABNORMAL HIGH (ref 98–192)

## 2015-02-03 LAB — BASIC METABOLIC PANEL
Anion gap: 8 (ref 5–15)
BUN: 13 mg/dL (ref 6–20)
CALCIUM: 8.6 mg/dL — AB (ref 8.9–10.3)
CHLORIDE: 97 mmol/L — AB (ref 101–111)
CO2: 25 mmol/L (ref 22–32)
Creatinine, Ser: 0.81 mg/dL (ref 0.61–1.24)
GFR calc non Af Amer: >60 mL/min (ref >60)
Glucose, Bld: 172 mg/dL — ABNORMAL HIGH (ref 65–99)
Potassium: 4.7 mmol/L (ref 3.5–5.1)
Sodium: 130 mmol/L — ABNORMAL LOW (ref 135–145)

## 2015-02-03 LAB — PROTIME-INR
INR: 1.42 (ref 0.00–1.49)
Prothrombin Time: 17.4 seconds — ABNORMAL HIGH (ref 11.6–15.2)

## 2015-02-03 LAB — GLUCOSE, CAPILLARY
GLUCOSE-CAPILLARY: 180 mg/dL — AB (ref 65–99)
Glucose-Capillary: 166 mg/dL — ABNORMAL HIGH (ref 65–99)
Glucose-Capillary: 194 mg/dL — ABNORMAL HIGH (ref 65–99)
Glucose-Capillary: 147 mg/dL — ABNORMAL HIGH (ref 65–99)

## 2015-02-03 LAB — ROCKY MTN SPOTTED FVR ABS PNL(IGG+IGM)
RMSF IgG: NEGATIVE
RMSF IgM: 0.19 index (ref 0.00–0.89)

## 2015-02-03 LAB — TSH: TSH: 0.82 u[IU]/mL (ref 0.350–4.500)

## 2015-02-03 LAB — T4, FREE: Free T4: 1.35 ng/dL — ABNORMAL HIGH (ref 0.61–1.12)

## 2015-02-03 MED ORDER — IOHEXOL 300 MG/ML  SOLN
100.0000 mL | Freq: Once | INTRAMUSCULAR | Status: AC | PRN
  Administered 2015-02-03: 100 mL via INTRAVENOUS

## 2015-02-03 MED ORDER — TRAMADOL HCL 50 MG PO TABS
50.0000 mg | ORAL_TABLET | Freq: Four times a day (QID) | ORAL | Status: DC
  Administered 2015-02-03 – 2015-02-16 (×46): 50 mg via ORAL
  Filled 2015-02-03 (×50): qty 1

## 2015-02-03 MED ORDER — WARFARIN SODIUM 3 MG PO TABS
3.0000 mg | ORAL_TABLET | Freq: Once | ORAL | Status: AC
  Administered 2015-02-03: 3 mg via ORAL
  Filled 2015-02-03: qty 1

## 2015-02-03 MED ORDER — PREDNISONE 10 MG PO TABS
10.0000 mg | ORAL_TABLET | Freq: Every day | ORAL | Status: DC
  Administered 2015-02-03 – 2015-02-06 (×4): 10 mg via ORAL
  Filled 2015-02-03 (×5): qty 1

## 2015-02-03 NOTE — H&P (Signed)
Chief Complaint: Patient was seen in consultation today for anemia Chief Complaint  Patient presents with  . Fall   at the request of Oncology- Dr. Alen Blew  Referring Physician(s): Dr. Alen Blew  History of Present Illness: Andre Tran is a 54 y.o. male with history of lymphoma and now with anemia and FUO and failure to thrive. ID following with no obvious signs of infection. Oncology has seen the patient and is requesting IR to perform bone marrow biopsy to rule out MDS/plasma cell disorder/Leukemia. He denies any chest pain. The patient has had multiple bone marrow biopsy procedures done in the past. He has previously tolerated sedation without complications.    Past Medical History  Diagnosis Date  . Paroxysmal ventricular tachycardia 2005, 2010    s/p AICD '05, replaced w/ St. Jude's in 2010 (PVT/V.Fib arrest requiring ICD implant in 2005)  . HYPERTENSION, UNSPECIFIED   . HYPERLIPIDEMIA-MIXED   . 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 Nonischemic cardiomyopathy     2/2 Adriamycin administration for lymphoma  . CHF (congestive heart failure)     EF 15% by echo 2009; s/p St. Jude ICD  . Diabetes mellitus     Type 2  . Cancer 1992, 2007     non hodkins lymphoma 1992, Hodgkins 2007  . Depression   . LV (left ventricular) mural thrombus 2009    2009, on coumadin  . Small bowel obstruction 2001    s/p Small bowel resection 2001  . Jejunal intussusception 2008    2008  . 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 Splenic mass 2009    Noted on Abd [place] 2009 w/ recs for f/u CT - not done  . Hypotension   . Noncompliance with medication regimen   . AICD (automatic cardioverter/defibrillator) present 2005, 2010  . Presence of permanent cardiac pacemaker     Past Surgical History  Procedure Laterality Date  . Cardiac defibrillator  placement      2005, replaced w/ St. Jude's 2010  . Vasectomy    . Bowel resection      small bowell 2/2 obstruction 2001  . Pacemaker insertion    . Insert / replace / remove pacemaker    . Colonoscopy N/A 09/18/2014    Procedure: COLONOSCOPY;  Surgeon: [name], MD;  Location: [facility] ENDOSCOPY;  Service: Endoscopy;  Laterality: N/A;  . Esophagogastroduodenoscopy N/A 09/18/2014    Procedure: ESOPHAGOGASTRODUODENOSCOPY (EGD);  Surgeon: [name], MD;  Location: [facility] ENDOSCOPY;  Service: Endoscopy;  Laterality: N/A;  . Left and right heart catheterization with coronary angiogram N/A 09/19/2014    Procedure: LEFT AND RIGHT HEART CATHETERIZATION WITH CORONARY ANGIOGRAM;  Surgeon: Jolaine Artist, MD;  Location: [facility] CATH LAB;  Service: Cardiovascular;  Laterality: N/A;  . Colonoscopy N/A 09/19/2014    Procedure: COLONOSCOPY;  Surgeon: [name], MD;  Location: [place];  Service: Gastroenterology;  Laterality: N/A;  . Insertion of implantable left ventricular assist device N/A 10/13/2014    Procedure: INSERTION OF IMPLANTABLE LEFT VENTRICULAR ASSIST DEVICE;  Surgeon: [name], MD;  Location: [place];  Service: Open Heart Surgery;  Laterality: N/A;  CIRC ARREST  NITRIC OXIDE  . Tee without cardioversion N/A 10/13/2014    Procedure: TRANSESOPHAGEAL ECHOCARDIOGRAM (TEE);  Surgeon: [name], MD;  Location: [place];  Service: Open Heart Surgery;  Laterality: N/A;  . Exploration post operative open heart N/A 10/13/2014    Procedure: EXPLORATION POST OPERATIVE OPEN HEART;  Surgeon: [name], MD;  Location: [place] OR;  Service: Open Heart Surgery;  Laterality: N/A;  . Cardioversion N/A 01/30/2015    Procedure: CARDIOVERSION;  Surgeon: Jolaine Artist, MD;  Location: [facility] OR;  Service: Cardiovascular;  Laterality: N/A;    Allergies: Hydralazine  Medications: Prior to Admission medications   Medication Sig Start Date End Date Taking? Authorizing Provider  acetaminophen (TYLENOL) 650 MG  CR tablet Take 1 tablet (650 mg total) by mouth every 8 (eight) hours as needed for pain. 12/19/14  Yes Alexa Sherral Hammers, MD  amiodarone (PACERONE) 200 MG tablet Take 1 tablet (200 mg total) by mouth daily. 10/24/14  Yes Amy D Clegg, NP  amLODipine (NORVASC) 5 MG tablet Take 1 tablet (5 mg total) by mouth daily. 01/13/15  Yes Amy D Clegg, NP  atorvastatin (LIPITOR) 40 MG tablet Take 0.5 tablets (20 mg total) by mouth at bedtime. Patient taking differently: Take 20 mg by mouth daily.  09/04/14  Yes Larey Dresser, MD  insulin glargine (LANTUS) 100 UNIT/ML injection Inject 0.05 mLs (5 Units total) into the skin at bedtime. 10/24/14  Yes Amy D Ninfa Meeker, NP  Magnesium 400 MG TABS Take 1 tablet by mouth daily with breakfast. 10/30/14  Yes Amy D Clegg, NP  Nutritional Supplements (ENSURE NUTRITION SHAKE) LIQD Take 1 Bottle by mouth 2 (two) times daily at 10 AM and 5 PM. 01/26/15  Yes Jolaine Artist, MD  pantoprazole (PROTONIX) 40 MG tablet Take 1 tablet (40 mg total) by mouth daily. 10/24/14  Yes Amy D Clegg, NP  albuterol (PROVENTIL HFA;VENTOLIN HFA) 108 (90 BASE) MCG/ACT inhaler Inhale 1-2 puffs into the lungs every 6 (six) hours as needed for wheezing or shortness of breath.    Historical Provider, MD  bisacodyl (DULCOLAX) 5 MG EC tablet Take 1 tablet (5 mg total) by mouth daily as needed for moderate constipation. 09/04/14   Larey Dresser, MD  methocarbamol (ROBAXIN) 500 MG tablet Take 1 tablet (500 mg total) by mouth every 6 (six) hours. Patient not taking: Reported on 01/28/2015 01/13/15   Amy D Ninfa Meeker, NP  warfarin (COUMADIN) 5 MG tablet Take 0.5-1 tablets (2.5-5 mg total) by mouth daily. Take 5 mg 01/13/15  then 2.5 mg every day except 5 mg Monday and Friday 01/13/15   Conrad Enterprise, NP     Family History  Problem Relation Age of Onset  . Other      No known family h/o heart disease  . Heart disease    . Diabetes Mother   . Other Other     complications from hip replacement    History   Social  History  . Marital Status: Married    Spouse Name: N/A  . Number of Children: N/A  . Years of Education: N/A   Social History Main Topics  . Smoking status: Never Smoker   . Smokeless tobacco: Never Used  . Alcohol Use: 1.2 oz/week    1 Cans of beer, 1 Shots of liquor per week     Comment: ON SPECIAL OCCASIONS  . Drug Use: No  . Sexual Activity: No   Other Topics Concern  . None   Social History Narrative   Review of Systems: A 12 point ROS discussed and pertinent positives are indicated in the HPI above.  All other systems are  negative.  Review of Systems  Vital Signs: BP 81/61 mmHg  Pulse 81  Temp(Src) 101.5 F (38.6 C) (Oral)  Resp 22  Ht $R'5\' 7"'qy$  (1.702 m)  Wt 136 lb 14.4 oz (62.097 kg)  BMI 21.44 kg/m2  SpO2 97%  Physical Exam  Constitutional: He is oriented to person, place, and time. No distress.  HENT:  Head: Normocephalic and atraumatic.  Neck: No tracheal deviation present.  Cardiovascular:  Mechanical heart sounds, LVAD hum heard  Pulmonary/Chest: Effort normal and breath sounds normal. No respiratory distress. He has no wheezes. He has no rales.  Abdominal: Soft. Bowel sounds are normal. He exhibits no distension. There is no tenderness.  Neurological: He is alert and oriented to person, place, and time.  Skin: He is not diaphoretic.    Mallampati Score:  MD Evaluation Airway: WNL Heart:  (LVAD) Abdomen: WNL Chest/ Lungs: WNL ASA  Classification: 3 Mallampati/Airway Score: Two  Imaging: Ct Abdomen Pelvis Wo Contrast  01/28/2015   CLINICAL DATA:  54 year old male with fall.  EXAM: CT CHEST WITHOUT CONTRAST  TECHNIQUE: Multidetector CT imaging of the chest was performed following the standard protocol without IV contrast.  COMPARISON:  CT dated 06/20/2014  FINDINGS: Evaluation of this exam is limited in the absence of intravenous contrast. Evaluation is also limited due to streak artifact caused by cardiac support devices.  The lungs are clear. No  pleural effusion or pneumothorax. The central airways are patent.  The thoracic aorta appears grossly unremarkable. There is aortoiliac atherosclerotic disease. There is postsurgical changes of open heart surgery with left pectoral pacemaker device and an implantable left ventricular assist device. No cardiomegaly. No significant pericardial effusion. Visualized heart anterior chest wall incisional scar. LVAD support wires noted in the anterior right abdominal wall.  No intra-abdominal free air or free fluid. The liver, gallbladder, pancreas, spleen, adrenal glands, kidneys, visualized ureters, and urinary bladder appear unremarkable. Small right renal upper pole hypodense lesion is not well characterized but likely represents a cyst. The prostate and seminal vesicles appear unremarkable.  Constipation with no evidence of bowel obstruction or inflammation. Normal appendix.  There is no adenopathy. No portal venous gas identified. There is focal induration of the periumbilical subcutaneous soft tissue likely related to recent port insertion. No drainable fluid collection/abscess identified. There are stable sclerotic changes of T12-L2. No acute fracture.  IMPRESSION: No acute intrathoracic, abdominal, or pelvic pathology.  Cardiac pacemaker device and left ventricular assist. No significant pericardial effusion. Drainable fluid collection/ abscess.  Stable T12-L2 sclerotic changes.   Electronically Signed   By: Anner Crete M.D.   On: 01/28/2015 21:09   Dg Chest 2 View  01/28/2015   CLINICAL DATA:  Multiple falls and syncopal events  EXAM: CHEST - 2 VIEW  COMPARISON:  12/18/2013  FINDINGS: Cardiac shadow is stable. An LVAD is noted. Defibrillator is seen and stable in appearance. The lungs are well aerated without focal infiltrate or sizable effusion.  IMPRESSION: No acute abnormality noted.   Electronically Signed   By: Inez Catalina M.D.   On: 01/28/2015 21:05   Dg Pelvis 1-2 Views  01/28/2015   CLINICAL  DATA:  Fall  EXAM: PELVIS - 1-2 VIEW  COMPARISON:  CT 06/20/2014  FINDINGS: Bony pelvic ring appears intact. No acute fracture line identified. Bilateral hips projects normally over the acetabula. Mild degenerative changes at the hips.  Surgical clips within the anatomic pelvis  Unremarkable appearance of the proximal femurs.  IMPRESSION: Negative for acute bony abnormality.  Signed,  Dulcy Fanny. Earleen Newport, DO  [facility]  [facility]   Electronically Signed   By: Corrie Mckusick D.O.   On: 01/28/2015 13:53   Ct Head Wo Contrast  02/03/2015   CLINICAL DATA:  54 year old male with new onset headache. Spontaneous bleed if few months ago from Coumadin. Still on low dose. Subsequent encounter.  EXAM: CT HEAD WITHOUT CONTRAST  TECHNIQUE: Contiguous axial images were obtained from the base of the skull through the vertex without intravenous contrast.  COMPARISON:  None.  FINDINGS: Remote left basal ganglia/ corona radiata infarct with encephalomalacia and dilated left lateral ventricle.  Encephalomalacia posterior right temporal lobe where patient had prior intracranial hemorrhage.  No new intracranial hemorrhage detected.  No CT evidence of large acute thrombotic infarct.  No intracranial mass lesion detected on this unenhanced exam as separate from above described findings.  Orbital structures unremarkable.  No calvarial abnormality.  Mastoid air cells, middle ear cavities and visualized paranasal sinuses are clear.  IMPRESSION: Remote left basal ganglia/ corona radiata infarct with encephalomalacia and dilated left lateral ventricle.  Encephalomalacia posterior right temporal lobe where patient had prior intracranial hemorrhage.  No new intracranial hemorrhage detected.   Electronically Signed   By: Genia Del M.D.   On: 02/03/2015 09:55   Ct Head Wo Contrast  01/30/2015   CLINICAL DATA:  Previous stroke with right-sided weakness  EXAM: CT HEAD WITHOUT CONTRAST   TECHNIQUE: Contiguous axial images were obtained from the base of the skull through the vertex without intravenous contrast.  COMPARISON:  01/12/2015  FINDINGS: The bony calvarium is intact. There are changes consistent with prior ischemic event in the left basal ganglia with encephalomalacia changes. Changes in the posterior right temporal lobe are also noted consistent with mild encephalomalacia. No findings to suggest acute hemorrhage, acute infarction or space-occupying mass lesion is identified.  IMPRESSION: Chronic ischemic changes without acute abnormality.   Electronically Signed   By: Inez Catalina M.D.   On: 01/30/2015 14:49   Ct Head Wo Contrast  01/12/2015   CLINICAL DATA:  Headache for 1 week, history stroke, LVAD, hypertension, non ischemic cardiomyopathy, CHF, diabetes mellitus, lymphoma in remission  EXAM: CT HEAD WITHOUT CONTRAST  TECHNIQUE: Contiguous axial images were obtained from the base of the skull through the vertex without intravenous contrast.  COMPARISON:  01/28/2015  FINDINGS: Generalized atrophy.  Stable ex vacuo dilatation of the LEFT lateral ventricle.  Large old LEFT basal ganglia infarct extending into external capsule.  Small LEFT thalamus.  Mild small vessel chronic ischemic changes of deep cerebral white matter.  High attenuation hemorrhage and edema seen previously at the RIGHT temporal lobe has resolved, with residual infarct at site.  No recurrent intracranial hemorrhage, mass lesion or additional acute infarct identified.  No extra-axial fluid collections.  Posterior fossa unremarkable.  Visualized paranasal sinuses and mastoid air cells clear.  No acute osseous findings.  Minimal atherosclerotic calcification at the carotid siphons.  IMPRESSION: Old LEFT basal ganglia and RIGHT temporal lobe infarcts.  Resolution of high attenuation RIGHT temporal lobe hematoma and edema since 12/05/2014.  No new intracranial abnormalities.   Electronically Signed   By: Lavonia Dana M.D.    On: 01/12/2015 08:59   Ct Chest Wo Contrast  01/28/2015   CLINICAL DATA:  54 year old male with fall.  EXAM: CT CHEST WITHOUT CONTRAST  TECHNIQUE: Multidetector CT imaging of the chest was performed following the standard protocol without IV contrast.  COMPARISON:  CT dated  06/20/2014  FINDINGS: Evaluation of this exam is limited in the absence of intravenous contrast. Evaluation is also limited due to streak artifact caused by cardiac support devices.  The lungs are clear. No pleural effusion or pneumothorax. The central airways are patent.  The thoracic aorta appears grossly unremarkable. There is aortoiliac atherosclerotic disease. There is postsurgical changes of open heart surgery with left pectoral pacemaker device and an implantable left ventricular assist device. No cardiomegaly. No significant pericardial effusion. Visualized heart anterior chest wall incisional scar. LVAD support wires noted in the anterior right abdominal wall.  No intra-abdominal free air or free fluid. The liver, gallbladder, pancreas, spleen, adrenal glands, kidneys, visualized ureters, and urinary bladder appear unremarkable. Small right renal upper pole hypodense lesion is not well characterized but likely represents a cyst. The prostate and seminal vesicles appear unremarkable.  Constipation with no evidence of bowel obstruction or inflammation. Normal appendix.  There is no adenopathy. No portal venous gas identified. There is focal induration of the periumbilical subcutaneous soft tissue likely related to recent port insertion. No drainable fluid collection/abscess identified. There are stable sclerotic changes of T12-L2. No acute fracture.  IMPRESSION: No acute intrathoracic, abdominal, or pelvic pathology.  Cardiac pacemaker device and left ventricular assist. No significant pericardial effusion. Drainable fluid collection/ abscess.  Stable T12-L2 sclerotic changes.   Electronically Signed   By: Anner Crete M.D.   On:  01/28/2015 21:09   Dg Foot Complete Right  02/01/2015   CLINICAL DATA:  Acute right heel pain.  No known injuries.  EXAM: RIGHT FOOT COMPLETE - 3+ VIEW  COMPARISON:  None.  FINDINGS: No evidence of plantar calcaneal spur. No visible soft tissue swelling associated with the plantar fascia. Small enthesopathic spur at the insertion of the Achilles tendon on the posterior calcaneus. No evidence of acute fracture or dislocation. Joint spaces well preserved. Well-preserved bone mineral density. No significant intrinsic osseous abnormalities.  IMPRESSION: No significant abnormality.  No evidence of plantar calcaneal spur.   Electronically Signed   By: Evangeline Dakin M.D.   On: 02/01/2015 13:52    Labs:  CBC:  Recent Labs  01/26/15 1045 01/28/15 1417 01/29/15 0244 02/01/15 0258  WBC 9.2 12.3* 10.9* 9.7  HGB 12.4* 11.2* 9.9* 9.8*  HCT 36.3* 33.1* 29.4* 29.2*  PLT 304 256 238 274    COAGS:  Recent Labs  10/13/14 1455 10/13/14 1640  01/31/15 0225 02/01/15 0258 02/02/15 0235 02/03/15 0230  INR 1.55* NOT CALCULATED  2.22*  < > 2.41* 2.19* 1.98* 5.49  APTT 41* DUPLICATE REQUEST  61*  --   --   --   --   --   < > = values in this interval not displayed.  BMP:  Recent Labs  01/31/15 0225 02/01/15 0258 02/02/15 0235 02/03/15 0230  NA 130* 133* 131* 130*  K 3.5 4.1 4.0 4.7  CL 95* 98* 97* 97*  CO2 $Re'26 28 26 25  'FuT$ GLUCOSE 202* 166* 249* 172*  BUN $Re'9 12 15 13  'hYZ$ CALCIUM 8.7* 8.7* 8.7* 8.6*  CREATININE 1.05 1.06 0.91 0.81  GFRNONAA >60 >60 >60 >60  GFRAA >60 >60 >60 >60    LIVER FUNCTION TESTS:  Recent Labs  11/13/14 1313 01/26/15 1045 01/29/15 0244 01/30/15 0944  BILITOT 1.0 1.0 0.6 0.8  AST 31 81* 87* 122*  ALT 25 61 63 80*  ALKPHOS 130* 140* 130* 149*  PROT 8.9* 8.2* 6.8 7.9  ALBUMIN 4.5 4.1 3.0* 3.5    Assessment and Plan:  Anemia History of Lymphoma s/p therapy 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 Risks and Benefits discussed with the patient including, but not limited to bleeding, infection, damage to adjacent structures or low yield requiring additional tests. All of the patient's questions were answered, patient is agreeable to proceed. Consent signed and in chart. Nonischemic cardiomyopathy s/p LVAD  LV thrombus on coumadin Chronic systolic CHF, EF 77-41% secondary to chemotherapy  Admitted with fevers- TEE 7/23 no vegetations, Blood Cx no growth- ID on board History of VT/VF AICD History of CVA Headache- on coumadin with history of ICH, CT head done 7/26 with no signs of new hemorrhage.    Thank you for this interesting consult.  I greatly enjoyed meeting DAVIONNE DOWTY and look forward to participating in their care.  A copy of this report was sent to the requesting provider on this date.  SignedHedy Jacob 02/03/2015, 11:37 AM   I spent a total of 20 Minutes in face to face in clinical consultation, greater than 50% of which was counseling/coordinating care for anemia.

## 2015-02-03 NOTE — Progress Notes (Signed)
Nutrition Follow-up  DOCUMENTATION CODES:   Severe malnutrition in context of chronic illness  INTERVENTION:   -Continue Ensure Enlive po TID, each supplement provides 350 kcal and 20 grams of protein  NUTRITION DIAGNOSIS:   Malnutrition related to chronic illness as evidenced by severe depletion of body fat, severe depletion of muscle mass, energy intake < or equal to 75% for > or equal to 1 month, percent weight loss.  Ongoing  GOAL:   Patient will meet greater than or equal to 90% of their needs  Progressing  MONITOR:   PO intake, Supplement acceptance, Skin, Labs  REASON FOR ASSESSMENT:   Consult Assessment of nutrition requirement/status  ASSESSMENT:   Pt admitted after 2 falls at home with left hip pain negative for fx. 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  Pt s/p TEE and cardioversion on 01/31/15. Per MD notes, no evidence of endocarditis.   Pt reports his appetite is slowly improving, however, it is still poor. Noted variable meal completion- PO: 5-100% (averaging 20% meal completion). Pt reports he ate his bacon and tea this morning ("that was all I wanted"), but expressed frustration about not being able to finish his breakfast due to multiple doctor visits and procedures (underwent STAT CT of head earlier this AM). Pt reports family is occasionally providing outside food for him as well. He reports his family brought him a BBQ sandwich for his birthday last week  He would like to continue with Ensure supplements- pt took a few sips of container on bedside table. Offered additional supplements, but pt declined at this time. Reinforced importance of consuming meals and supplements to support healing.   Pt remains on Remeron.   Oncology following. Awaiting bone marrow biopsy by IR.  Labs reviewed: Na: 130.   Diet Order:  Diet regular Room service appropriate?: Yes; Fluid consistency::  Thin  Skin:  Reviewed, no issues (closed rt upper abdominal incision)  Last BM:  01/29/15  Height:   Ht Readings from Last 1 Encounters:  01/28/15 $RemoveB'5\' 7"'eEuKsTBP$  (1.702 m)    Weight:   Wt Readings from Last 1 Encounters:  02/03/15 136 lb 14.4 oz (62.097 kg)    Ideal Body Weight:  67.2 kg  Wt Readings from Last 10 Encounters:  02/03/15 136 lb 14.4 oz (62.097 kg)  01/26/15 146 lb (66.225 kg)  01/13/15 145 lb 4.5 oz (65.9 kg)  01/06/15 156 lb 3.2 oz (70.852 kg)  12/24/14 157 lb (71.215 kg)  12/19/14 155 lb 6.4 oz (70.489 kg)  12/10/14 156 lb 3.2 oz (70.852 kg)  12/01/14 157 lb (71.215 kg)  11/28/14 148 lb 9.4 oz (67.4 kg)  11/20/14 157 lb 8 oz (71.442 kg)    BMI:  Body mass index is 21.44 kg/(m^2).  Estimated Nutritional Needs:   Kcal:  1900-2100  Protein:  95-115 grams  Fluid:  > 1.5 L/day  EDUCATION NEEDS:   No education needs identified at this time  Robbyn Hodkinson A. Jimmye Norman, RD, LDN, CDE Pager: [phone] After hours Pager: [phone]

## 2015-02-03 NOTE — Progress Notes (Addendum)
Patient ID: Andre Tran, male   DOB: [birth year], 54 y.o.   MRN: [id number]        [facility]    Date of Admission:  01/28/2015   Total days of antibiotics 0  Principal Problem:   Fever Active Problems:   Chronic systolic heart failure   LVAD (left ventricular assist device) present   Unexplained weight loss   Fall   Failure to thrive (0-17)   FUO (fever of unknown origin)   Anemia   . amiodarone  200 mg Oral Daily  . amLODipine  2.5 mg Oral Daily  . atorvastatin  20 mg Oral QHS  . feeding supplement (ENSURE ENLIVE)  237 mL Oral TID BM  . insulin aspart  0-9 Units Subcutaneous TID WC  . insulin glargine  5 Units Subcutaneous QHS  . magnesium oxide  400 mg Oral Daily  . methocarbamol  500 mg Oral 4 times per day  . mirtazapine  7.5 mg Oral QHS  . pantoprazole  40 mg Oral Daily  . predniSONE  10 mg Oral QAC breakfast  . traMADol  50 mg Oral 4 times per day  . warfarin  3 mg Oral ONCE-1800  . Warfarin - Pharmacist Dosing Inpatient   Does not apply q1800    Subjective: Andre Tran says his headache is a little better today. His right heel and left hip continue to hurt a little but are improving as well. Says he felt chilled this morning during his fever. Denies diarrhea, cough, dysuria.  Review of Systems: Pertinent items are noted in HPI.  Past Medical History  Diagnosis Date  . Paroxysmal ventricular tachycardia 2005, 2010    s/p AICD '05, replaced w/ St. Jude's in 2010 (PVT/V.Fib arrest requiring ICD implant in 2005)  . HYPERTENSION, UNSPECIFIED   . HYPERLIPIDEMIA-MIXED   . 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 Nonischemic cardiomyopathy     2/2 Adriamycin administration for lymphoma  . CHF (congestive heart failure)     EF 15% by echo 2009; s/p St. Jude ICD  . Diabetes mellitus     Type 2  . Cancer 1992, 2007     non hodkins  lymphoma 1992, Hodgkins 2007  . Depression   . LV (left ventricular) mural thrombus 2009    2009, on coumadin  . Small bowel obstruction 2001    s/p Small bowel resection 2001  . Jejunal intussusception 2008    2008  . 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 Splenic mass 2009    Noted on Abd [place] 2009 w/ recs for f/u CT - not done  . Hypotension   . Noncompliance with medication regimen   . AICD (automatic cardioverter/defibrillator) present 2005, 2010  . Presence of permanent cardiac pacemaker   . Renal insufficiency     History  Substance Use Topics  . Smoking status: Never Smoker   . Smokeless tobacco: Never Used  . Alcohol Use: 1.2 oz/week    1 Cans of beer, 1 Shots of liquor per week     Comment: ON SPECIAL OCCASIONS    Family History  Problem Relation Age of Onset  . Other      No known family h/o heart disease  . Heart disease    . Diabetes Mother   . Other Other  complications from hip replacement   Allergies  Allergen Reactions  . Hydralazine Other (See Comments)    Severe headaches     OBJECTIVE: Blood pressure 81/61, pulse 81, temperature 97.8 F (36.6 C), temperature source Oral, resp. rate 21, height $RemoveBe'5\' 7"'MaDTVgDZJ$  (1.702 m), weight 62.097 kg (136 lb 14.4 oz), SpO2 98 %. General: Lying in bed watching television. Skin: Dry, intact. Cor: LVAD driveline without surrounding erythema or signs of infection. Abdomen: Soft, non-tender.  Lab Results Lab Results  Component Value Date   WBC 9.7 02/01/2015   HGB 9.8* 02/01/2015   HCT 29.2* 02/01/2015   MCV 84.4 02/01/2015   PLT 274 02/01/2015    Lab Results  Component Value Date   CREATININE 0.81 02/03/2015   BUN 13 02/03/2015   NA 130* 02/03/2015   K 4.7 02/03/2015   CL 97* 02/03/2015   CO2 25 02/03/2015    Lab Results  Component Value Date   ALT 80* 01/30/2015   AST 122* 01/30/2015   ALKPHOS 149* 01/30/2015   BILITOT 0.8 01/30/2015     Microbiology: Recent  Results (from the past 240 hour(s))  Culture, blood (routine x 2)     Status: None   Collection Time: 01/26/15 11:20 AM  Result Value Ref Range Status   Specimen Description BLOOD RIGHT ANTECUBITAL  Final   Special Requests BOTTLES DRAWN AEROBIC ONLY 4CC  Final   Culture NO GROWTH 5 DAYS  Final   Report Status 01/31/2015 FINAL  Final  Culture, blood (routine x 2)     Status: None (Preliminary result)   Collection Time: 01/30/15  4:58 PM  Result Value Ref Range Status   Specimen Description BLOOD RIGHT ANTECUBITAL  Final   Special Requests BOTTLES DRAWN AEROBIC ONLY 6CC  Final   Culture NO GROWTH 4 DAYS  Final   Report Status PENDING  Incomplete  Culture, blood (routine x 2)     Status: None (Preliminary result)   Collection Time: 01/30/15  5:10 PM  Result Value Ref Range Status   Specimen Description BLOOD RIGHT ANTECUBITAL  Final   Special Requests BOTTLES DRAWN AEROBIC ONLY 3CC  Final   Culture NO GROWTH 4 DAYS  Final   Report Status PENDING  Incomplete    Assessment: The source of Andre Tran's fever continues to be unknown but we still have low suspicion for an infectious source. He spiked another fever of 101 this morning. Blood cultures remain negative. My other consideration is drug-induced fever. After looking at the chart it appears he was started on pantoprazole, methocarbamol, and mirtazapine since being inpatient. It may be worthwhile to consider discontinuing one of these medications and trend his fever afterward, though he did have a fever prior to coming in. We can also order a differential on the CBC to evaluate for eosinophilia (albeit this is present in <20% cases of drug-induced fever).   Head CT today was notable only for prior infarcts in the left basal ganglia and right temporal lobe. It's still possible this he has a CNS fever although his hypothalamus was unaffected. We ordered a TSH and Free T4 yesterday to evaluate for amiodarone-induced thryoid changes but these  were unremarkable. We will follow the results of his bone marrow biopsy taken today to see if leukemia is the cause of his persistent fever.  Plan: 1. Continue to hold antibiotics 2. Consider discontinuing uneccessary medications to evaluate for drug-induced fever  Attending addendum: agree with above.  Medications possible though do not seem to  be temporally related.  Left hip pain but ? Related to left kidney finding noted on CT.  Ultrasound of kidney ordered to evaluate.    Loleta Chance, MD [facility] 02/03/2015, 3:08 PM

## 2015-02-03 NOTE — Progress Notes (Signed)
Admitted 01/28/15 due to falls and failure to thrive.  HeartMate II LVAD implated on 10/13/14 by Dr. Cyndia Bent.   Vital signs: Temp:  101.5  Again this morning HR:  80 - 100 Doppler MAP: 82 - 84 O2 Sat:  97 % RA Weight in lbs: 134 > 138 > 140 > 138 > 135 > 139 > 136  Telemetry:  SR with 1st degree AV block  LVAD interrogation reveals:  Speed:  8600 (dropped 02/02/15 d/t PI events) Flow:  4.1 Power:  4 PI: 5.6 Alarms:  none Events:  18 PI events  Fixed speed:  8400 Low speed limit: 8000 (< 8000 will cause regurgitant flow)  Drive Line:  Dressing changed 02/02/15 PM by myself. Site looked unremarkable. No drainage, swelling, induration, tenderness, foul odor noted. Full tissue ingrowth noted. Sorbaview dressing, biopatch and anchor changed according to policy. Next dressing to be changed 02/09/15  Labs:  LDH trend: 446 > 322 > 302 > 341 > 344 > 359 > 392  INR trend:  2.48 > 2.92 > 3.48 > 2.41 > 2.19 > 1.98 > 1.42    Plan/Recommendations as discussed with team: 1. Complaining of a lot of left hip/side pain today. Had a difficult time moving from the bed to the CT table. D/W Amy and will start Ultram for pain since Tylenol is not effective.   2. LDH trending up--watch for power spikes (none observed currently with interrogation) d/t decreased speed and sub-therapeutic INR/lower INR goal.   3. Currently planning bone marrow biopsy. D/W radiology team this morning--will have to attempt procedure while side lying patient with their inability to be prone d/t equipment. Tentatively Wednesday 02/04/15 if [facility] team can accommodate it here, if not they reported it will likely be next week. Sed Rate 108.   4. Repeating C/A/P CT with contrast today. STAT head CT completed and ruled out ICH (complained of headache this morning which eased after being given Tylenol). Currently reporting 0/10 pain for head.   5. Activity with PT as he can tolerate with hip and right heel pain.

## 2015-02-03 NOTE — Progress Notes (Signed)
HeartMate 2 Rounding Note  Subjective:    Admitted after fall and failure to thrive and paroxysmal fevers. Recent 30 pound weight loss and anorexia. ESR 108. CT C/A/P no evidence of recurrent lymphoma or abscess. LDH tending up 828>003>491 .   7/25 VAD speed turned down to 8400   TEE 7/23 no vegetations  Febrile over night. 101.5.  Complaining of headache. Headache started this morning. Denies SOB.    LVAD INTERROGATION:  HeartMate II LVAD:  Flow 4.1  liters/min, speed 8400, power 4, PI 5.5   ~20 PI events   Objective:    Vital Signs:   Temp:  [97.9 F (36.6 C)-101.5 F (38.6 C)] 101.5 F (38.6 C) (07/26 0727) Pulse Rate:  [81] 81 (07/25 1500) Resp:  [19-24] 21 (07/26 0400) SpO2:  [97 %-100 %] 97 % (07/26 0727) Weight:  [136 lb 14.4 oz (62.097 kg)] 136 lb 14.4 oz (62.097 kg) (07/26 0500) Last BM Date: 01/29/15 Mean arterial Pressure 70-80s  Intake/Output:   Intake/Output Summary (Last 24 hours) at 02/03/15 0751 Last data filed at 02/03/15 0400  Gross per 24 hour  Intake    600 ml  Output    725 ml  Net   -125 ml     Physical Exam: General:  In bed. Fatigued appearing.  HEENT: normal Neck: supple. JVP 5-6  Carotids 2+ bilat; no bruits. No lymphadenopathy or thryomegaly appreciated. Cor: Mechanical heart sounds with LVAD hum present. Lungs: clear Abdomen: soft, nontender, nondistended. No hepatosplenomegaly. No bruits or masses. Good bowel sounds. Driveline: C/D/I; securement device intact and driveline incorporated Extremities: no cyanosis, clubbing, rash, edema  Neuro: alert & orientedx3, cranial nerves grossly intact. moves all 4 extremities w/o difficulty. Affect flat  Telemetry: SR 80s   Labs: Basic Metabolic Panel:  Recent Labs Lab 01/30/15 0944 01/31/15 0225 02/01/15 0258 02/02/15 0235 02/03/15 0230  NA 130* 130* 133* 131* 130*  K 4.5 3.5 4.1 4.0 4.7  CL 96* 95* 98* 97* 97*  CO2 $Re'26 26 28 26 25  'yqT$ GLUCOSE 225* 202* 166* 249* 172*  BUN $Re'10 9 12 15 13   'zzt$ CREATININE 1.12 1.05 1.06 0.91 0.81  CALCIUM 9.0 8.7* 8.7* 8.7* 8.6*    Liver Function Tests:  Recent Labs Lab 01/29/15 0244 01/30/15 0944  AST 87* 122*  ALT 63 80*  ALKPHOS 130* 149*  BILITOT 0.6 0.8  PROT 6.8 7.9  ALBUMIN 3.0* 3.5   No results for input(s): LIPASE, AMYLASE in the last 168 hours. No results for input(s): AMMONIA in the last 168 hours.  CBC:  Recent Labs Lab 01/28/15 1417 01/29/15 0244 02/01/15 0258  WBC 12.3* 10.9* 9.7  NEUTROABS  --  8.2*  --   HGB 11.2* 9.9* 9.8*  HCT 33.1* 29.4* 29.2*  MCV 84.9 84.5 84.4  PLT 256 238 274    INR:  Recent Labs Lab 01/30/15 0358 01/31/15 0225 02/01/15 0258 02/02/15 0235 02/03/15 0230  INR 3.48* 2.41* 2.19* 1.98* 1.42    Other results:    Imaging: Dg Foot Complete Right  02/01/2015   CLINICAL DATA:  Acute right heel pain.  No known injuries.  EXAM: RIGHT FOOT COMPLETE - 3+ VIEW  COMPARISON:  None.  FINDINGS: No evidence of plantar calcaneal spur. No visible soft tissue swelling associated with the plantar fascia. Small enthesopathic spur at the insertion of the Achilles tendon on the posterior calcaneus. No evidence of acute fracture or dislocation. Joint spaces well preserved. Well-preserved bone mineral density. No significant intrinsic osseous abnormalities.  IMPRESSION: No significant abnormality.  No evidence of plantar calcaneal spur.   Electronically Signed   By: Evangeline Dakin M.D.   On: 02/01/2015 13:52     Medications:     Scheduled Medications: . amiodarone  200 mg Oral Daily  . amLODipine  2.5 mg Oral Daily  . atorvastatin  20 mg Oral QHS  . feeding supplement (ENSURE ENLIVE)  237 mL Oral TID BM  . insulin aspart  0-9 Units Subcutaneous TID WC  . insulin glargine  5 Units Subcutaneous QHS  . magnesium oxide  400 mg Oral Daily  . methocarbamol  500 mg Oral 4 times per day  . mirtazapine  7.5 mg Oral QHS  . pantoprazole  40 mg Oral Daily  . warfarin  2 mg Oral q1800  . Warfarin -  Pharmacist Dosing Inpatient   Does not apply q1800    Infusions:    PRN Medications: acetaminophen, albuterol, bisacodyl, midazolam   Assessment:   1. Fever and failure to thrive 2. Chronic systolic HF - Status post LVAD implantation for DT.  - 7/22 Speed turned down to 8600.  - 7/24 speed turned down to 8400 3. H/o Non Hodgkins Lymphoma s/p therapy- CT chest/abd/pelvis ok.  4.  H/O CVA 2008  5. H/O VT/VF --St Jude ICD 2010 - Interrogated device. No arrhythmias noted.   6. DMII- not on any meds. Glucose elevated on BMET.  Start checking CBGs.  7. 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 8. Recent ICH 5/16.  -stable no new neuro deficits. Head CT - no recurrence 9. Hyponatremia 10. Severe malnutrition in context of chronic illness 11. R heel pain--Xray ok .  12. Headache.   Febrile over night. Severe Headache this morning. Get stat CT of head now.   SR 108. Izard remain negative. CT C/A/P no evidence of recurrent lymphoma or abscess. Plan to check CT abd and chest with contrast. LDH stable 359>392.  ESR 108.    7/23 TEE without vegetations.   ID appreciated   HemConsult ID and Hem/Onc today. Plan for bone marrow aspirate   Uric Acid 2.1 R heel Xray normal.    Check ehrlichiosis and RMSF in process.     EF 40-45% on TEE. V  ~20 PI events . Continue speed at 8400.  Multiple PI events again overnight.   INR 1.42 . On coumadin. Pharmacy dosing.   I reviewed the LVAD parameters from today, and compared the results to the patient's prior recorded data.  No programming changes were made.  The LVAD is functioning within specified parameters.  The patient performs LVAD self-test daily.  LVAD interrogation was negative for any significant power changes, alarms or PI events/speed drops.  LVAD equipment check completed and is in good working order.  Back-up equipment present.   LVAD education done on emergency procedures and precautions and reviewed exit site  care.  Length of Stay: 6  CLEGG,AMY NP-C  02/03/2015, 7:51 AM  VAD Team --- VAD ISSUES ONLY--- Pager [phone] (7am - 7am)  Advanced Heart Failure Team  Pager [phone] (M-F; 7a - 4p)  Please contact [facility] for night-coverage after hours (4p -7a ) and weekends on [email or web address]  Patient seen and examined with Darrick Grinder, NP. We discussed all aspects of the encounter. I agree with the assessment and plan as stated above.   Feels worse today. C/o HA and hip pain. I have growing concern for possible temporal arteritis/PMR with ESR > 100. Will d/w rheum. May  be worth empiric trial of steroids to assess response. Plan BMBx today. CT C/A/P per Dr. Prescott Gum. VAD parameters stable.   Jusitn Salsgiver,MD 1:48 PM

## 2015-02-03 NOTE — Progress Notes (Signed)
ANTICOAGULATION CONSULT NOTE - Follow Up Consult  Pharmacy Consult for Coumadin Indication: LVAD  Allergies  Allergen Reactions  . Hydralazine Other (See Comments)    Severe headaches     Patient Measurements: Height: 5\' 7"  (170.2 cm) Weight: 136 lb 14.4 oz (62.097 kg) (standing scale ) IBW/kg (Calculated) : 66.1  Vital Signs: Temp: 101.5 F (38.6 C) (07/26 0727) Temp Source: Oral (07/26 0727)  Labs:  Recent Labs  02/01/15 0258 02/02/15 0235 02/03/15 0230  HGB 9.8*  --   --   HCT 29.2*  --   --   PLT 274  --   --   LABPROT 24.1* 22.4* 17.4*  INR 2.19* 1.98* 1.42  CREATININE 1.06 0.91 0.81    Estimated Creatinine Clearance: 93.7 mL/min (by C-G formula based on Cr of 0.81).  Assessment: 53yom on coumadin pta for his LVAD (10/13/2014), admitted s/p fall (did not hit his head). INR therapeutic on admission and coumadin resumed. INR today has trended down to SUBtherapeutic level at 1.42. Of note, dose entered for 7/24 not charted as given which is likely contributing to continued trend down.  Aiming for a lower INR goal of 1.7-2.2 due to recent ICH (11/24/14).  Will continue to dose conservatively - seems to be more sensitive to warf than PTA. CBC, LDH are stable. No bleeding reported.  Goal of Therapy:  INR 1.7-2.2 Monitor platelets by anticoagulation protocol: Yes   Plan:  1) Coumadin 3 mg PO x 1 tonight 2) INR in AM  Tiago Humphrey K. Velva Harman, PharmD, [place] Clinical Pharmacist Pager: [phone] Pharmacy: [phone] 02/03/2015 11:21 AM

## 2015-02-04 ENCOUNTER — Ambulatory Visit (HOSPITAL_COMMUNITY): Payer: Medicare Other

## 2015-02-04 ENCOUNTER — Ambulatory Visit (HOSPITAL_COMMUNITY): Payer: Medicare Other | Attending: Oncology

## 2015-02-04 DIAGNOSIS — D649 Anemia, unspecified: Secondary | ICD-10-CM | POA: Insufficient documentation

## 2015-02-04 DIAGNOSIS — C8590 Non-Hodgkin lymphoma, unspecified, unspecified site: Secondary | ICD-10-CM | POA: Insufficient documentation

## 2015-02-04 LAB — CULTURE, BLOOD (ROUTINE X 2)
CULTURE: NO GROWTH
Culture: NO GROWTH

## 2015-02-04 LAB — CBC
HEMATOCRIT: 28.1 % — AB (ref 39.0–52.0)
HEMOGLOBIN: 9.5 g/dL — AB (ref 13.0–17.0)
MCH: 28.4 pg (ref 26.0–34.0)
MCHC: 33.8 g/dL (ref 30.0–36.0)
MCV: 84.1 fL (ref 78.0–100.0)
Platelets: 329 10*3/uL (ref 150–400)
RBC: 3.34 MIL/uL — ABNORMAL LOW (ref 4.22–5.81)
RDW: 14.1 % (ref 11.5–15.5)
WBC: 9.2 10*3/uL (ref 4.0–10.5)

## 2015-02-04 LAB — PROTIME-INR
INR: 1.43 (ref 0.00–1.49)
Prothrombin Time: 17.5 seconds — ABNORMAL HIGH (ref 11.6–15.2)

## 2015-02-04 LAB — BASIC METABOLIC PANEL
ANION GAP: 7 (ref 5–15)
BUN: 10 mg/dL (ref 6–20)
CHLORIDE: 94 mmol/L — AB (ref 101–111)
CO2: 26 mmol/L (ref 22–32)
Calcium: 8.9 mg/dL (ref 8.9–10.3)
Creatinine, Ser: 0.77 mg/dL (ref 0.61–1.24)
GFR calc Af Amer: >60 mL/min (ref >60)
GFR calc non Af Amer: >60 mL/min (ref >60)
GLUCOSE: 207 mg/dL — AB (ref 65–99)
Potassium: 4.8 mmol/L (ref 3.5–5.1)
SODIUM: 127 mmol/L — AB (ref 135–145)

## 2015-02-04 LAB — LACTATE DEHYDROGENASE: LDH: 298 U/L — AB (ref 98–192)

## 2015-02-04 LAB — GLUCOSE, CAPILLARY
Glucose-Capillary: 180 mg/dL — ABNORMAL HIGH (ref 65–99)
Glucose-Capillary: 332 mg/dL — ABNORMAL HIGH (ref 65–99)
Glucose-Capillary: 359 mg/dL — ABNORMAL HIGH (ref 65–99)
Glucose-Capillary: 296 mg/dL — ABNORMAL HIGH (ref 65–99)

## 2015-02-04 LAB — BONE MARROW EXAM

## 2015-02-04 MED ORDER — MIDAZOLAM HCL 2 MG/2ML IJ SOLN
INTRAMUSCULAR | Status: AC
  Filled 2015-02-04: qty 2

## 2015-02-04 MED ORDER — FENTANYL CITRATE (PF) 100 MCG/2ML IJ SOLN
INTRAMUSCULAR | Status: AC
  Filled 2015-02-04: qty 2

## 2015-02-04 MED ORDER — LIDOCAINE-EPINEPHRINE 1 %-1:100000 IJ SOLN
INTRAMUSCULAR | Status: AC
  Filled 2015-02-04: qty 1

## 2015-02-04 MED ORDER — WARFARIN SODIUM 3 MG PO TABS
3.0000 mg | ORAL_TABLET | Freq: Once | ORAL | Status: AC
  Administered 2015-02-04: 3 mg via ORAL
  Filled 2015-02-04: qty 1

## 2015-02-04 MED ORDER — FENTANYL CITRATE (PF) 100 MCG/2ML IJ SOLN
INTRAMUSCULAR | Status: AC | PRN
  Administered 2015-02-04: 25 ug via INTRAVENOUS

## 2015-02-04 MED ORDER — MIDAZOLAM HCL 2 MG/2ML IJ SOLN
INTRAMUSCULAR | Status: AC | PRN
  Administered 2015-02-04: 0.5 mg via INTRAVENOUS

## 2015-02-04 NOTE — Procedures (Signed)
Technically successful CT guided bone marrow aspiration and biopsy of right iliac crest. No immediate complications.    SignedSandi Mariscal Pager: [phone] 02/04/2015, 10:46 AM

## 2015-02-04 NOTE — Progress Notes (Signed)
HeartMate 2 Rounding Note  Subjective:    Admitted after fall and failure to thrive and paroxysmal fevers. Recent 30 pound weight loss and anorexia. ESR 108. CT C/A/P no evidence of recurrent lymphoma or abscess. LDH tending up 344>359>392 .   TEE 7/23 no vegetations 7/25 VAD speed turned down to 8400  7/26 CT Abd Chest- Concern for mass L kidney.   Afebrile over night.  Complaining of mild headache, L hip pain, and R heel pain.Marland Kitchen  LVAD INTERROGATION:  HeartMate II LVAD:  Flow 4.1  liters/min, speed 8400, power 4.2, PI 5.5   ~20 PI events   Objective:    Vital Signs:   Temp:  [97.8 F (36.6 C)-98.5 F (36.9 C)] 98 F (36.7 C) (07/27 0740) Pulse Rate:  [71-89] 71 (07/27 0740) Resp:  [19-22] 19 (07/27 0740) BP: (86)/(55) 86/55 mmHg (07/27 0740) SpO2:  [98 %-100 %] 100 % (07/27 0740) Weight:  [138 lb 0.1 oz (62.6 kg)] 138 lb 0.1 oz (62.6 kg) (07/27 0500) Last BM Date: 01/27/15 Mean arterial Pressure 70-80s  Intake/Output:   Intake/Output Summary (Last 24 hours) at 02/04/15 0857 Last data filed at 02/04/15 0600  Gross per 24 hour  Intake    600 ml  Output   1125 ml  Net   -525 ml     Physical Exam: General: Sitting in chair. Fatigued appearing.  HEENT: normal Neck: supple. JVP 5-6  Carotids 2+ bilat; no bruits. No lymphadenopathy or thryomegaly appreciated. Cor: Mechanical heart sounds with LVAD hum present. Lungs: clear Abdomen: soft, nontender, nondistended. No hepatosplenomegaly. No bruits or masses. Good bowel sounds. Driveline: C/D/I; securement device intact and driveline incorporated Extremities: no cyanosis, clubbing, rash, edema  Neuro: alert & orientedx3, cranial nerves grossly intact. moves all 4 extremities w/o difficulty. Affect flat  Telemetry: SR 80s   Labs: Basic Metabolic Panel:  Recent Labs Lab 01/31/15 0225 02/01/15 0258 02/02/15 0235 02/03/15 0230 02/04/15 0301  NA 130* 133* 131* 130* 127*  K 3.5 4.1 4.0 4.7 4.8  CL 95* 98* 97* 97* 94*   CO2 $Re'26 28 26 25 26  'dYO$ GLUCOSE 202* 166* 249* 172* 207*  BUN $Re'9 12 15 13 10  'akX$ CREATININE 1.05 1.06 0.91 0.81 0.77  CALCIUM 8.7* 8.7* 8.7* 8.6* 8.9    Liver Function Tests:  Recent Labs Lab 01/29/15 0244 01/30/15 0944  AST 87* 122*  ALT 63 80*  ALKPHOS 130* 149*  BILITOT 0.6 0.8  PROT 6.8 7.9  ALBUMIN 3.0* 3.5   No results for input(s): LIPASE, AMYLASE in the last 168 hours. No results for input(s): AMMONIA in the last 168 hours.  CBC:  Recent Labs Lab 01/28/15 1417 01/29/15 0244 02/01/15 0258 02/04/15 0301  WBC 12.3* 10.9* 9.7 9.2  NEUTROABS  --  8.2*  --   --   HGB 11.2* 9.9* 9.8* 9.5*  HCT 33.1* 29.4* 29.2* 28.1*  MCV 84.9 84.5 84.4 84.1  PLT 256 238 274 329    INR:  Recent Labs Lab 01/31/15 0225 02/01/15 0258 02/02/15 0235 02/03/15 0230 02/04/15 0301  INR 2.41* 2.19* 1.98* 1.42 1.43    Other results:    Imaging: Ct Head Wo Contrast  02/03/2015   CLINICAL DATA:  54 year old male with new onset headache. Spontaneous bleed if few months ago from Coumadin. Still on low dose. Subsequent encounter.  EXAM: CT HEAD WITHOUT CONTRAST  TECHNIQUE: Contiguous axial images were obtained from the base of the skull through the vertex without intravenous contrast.  COMPARISON:  None.  FINDINGS: Remote left basal ganglia/ corona radiata infarct with encephalomalacia and dilated left lateral ventricle.  Encephalomalacia posterior right temporal lobe where patient had prior intracranial hemorrhage.  No new intracranial hemorrhage detected.  No CT evidence of large acute thrombotic infarct.  No intracranial mass lesion detected on this unenhanced exam as separate from above described findings.  Orbital structures unremarkable.  No calvarial abnormality.  Mastoid air cells, middle ear cavities and visualized paranasal sinuses are clear.  IMPRESSION: Remote left basal ganglia/ corona radiata infarct with encephalomalacia and dilated left lateral ventricle.  Encephalomalacia  posterior right temporal lobe where patient had prior intracranial hemorrhage.  No new intracranial hemorrhage detected.   Electronically Signed   By: Genia Del M.D.   On: 02/03/2015 09:55   Ct Chest W Contrast  02/03/2015   CLINICAL DATA:  Fever.  Personal history of non-Hodgkin's lymphoma.  EXAM: CT CHEST, ABDOMEN, AND PELVIS WITH CONTRAST  TECHNIQUE: Multidetector CT imaging of the chest, abdomen and pelvis was performed following the standard protocol during bolus administration of intravenous contrast.  CONTRAST:  151mL OMNIPAQUE IOHEXOL 300 MG/ML  SOLN  COMPARISON:  CT scan of chest of January 28, 2015; CT scan of abdomen and pelvis of June 20, 2014. CT scan of chest, abdomen and pelvis of January 22, 2013.  FINDINGS: CT CHEST FINDINGS  No pneumothorax or pleural effusion is noted. No acute pulmonary disease is noted. Left ventricular assist device is noted, which extends from the apex of the left ventricle to the surgically created anastomosis in the ascending thoracic aorta. Left-sided pacemaker is again noted. There is no evidence of thoracic aortic dissection or aneurysm. Visualized portions of pulmonary arteries appear normal. Sternotomy wires are noted. No mediastinal mass or adenopathy is noted.  CT ABDOMEN AND PELVIS FINDINGS  No gallstones are noted. The liver, spleen and pancreas appear normal. Adrenal glands and right kidney appear normal. Atherosclerosis of abdominal aorta is noted without aneurysm or dissection. Mild stenosis is noted at the origin of the celiac artery. There is noted focal ill-defined low density in the lateral portion of the midpole cortex of the left kidney with adjacent perinephric stranding. This measures 2.0 x 2.0 cm in size. It is uncertain if this represents focal pyelonephritis or possibly infarction. This is not visualized on prior exams. Also noted is 9 x 7 mm ill-defined low density in the upper pole of the left kidney which was not present on prior exam which  potentially may represent neoplasm.  There is no evidence of bowel obstruction. No abnormal fluid collection is noted. Urinary bladder appears normal. No significant adenopathy is noted.  IMPRESSION: Left ventricular assist device is noted in the chest. No other significant abnormality is noted in the chest.  Atherosclerosis of abdominal aorta is noted without aneurysm or dissection.  Ill-defined low density measuring approximately 2 cm is noted in the lateral portion of the midpole cortex of the left kidney which was not present on prior exam. Mild perinephric stranding is noted adjacent to this area. It is uncertain if this represents focal renal infarction, possibly due to embolic disease, or focal pyelonephritis. Clinical correlation is recommended.  Also noted is 9 x 7 mm complex low density seen in upper pole of left kidney which was not present on prior exam and potentially may represent focal inflammation, but neoplasm cannot be excluded and further evaluation with ultrasound is recommended.  These results will be called to the ordering clinician or representative by the Radiologist Assistant, and  communication documented in the PACS or zVision Dashboard.   Electronically Signed   By: Marijo Conception, M.D.   On: 02/03/2015 13:56   Ct Abdomen Pelvis W Contrast  02/03/2015   CLINICAL DATA:  Fever.  Personal history of non-Hodgkin's lymphoma.  EXAM: CT CHEST, ABDOMEN, AND PELVIS WITH CONTRAST  TECHNIQUE: Multidetector CT imaging of the chest, abdomen and pelvis was performed following the standard protocol during bolus administration of intravenous contrast.  CONTRAST:  141mL OMNIPAQUE IOHEXOL 300 MG/ML  SOLN  COMPARISON:  CT scan of chest of January 28, 2015; CT scan of abdomen and pelvis of June 20, 2014. CT scan of chest, abdomen and pelvis of January 22, 2013.  FINDINGS: CT CHEST FINDINGS  No pneumothorax or pleural effusion is noted. No acute pulmonary disease is noted. Left ventricular assist device is  noted, which extends from the apex of the left ventricle to the surgically created anastomosis in the ascending thoracic aorta. Left-sided pacemaker is again noted. There is no evidence of thoracic aortic dissection or aneurysm. Visualized portions of pulmonary arteries appear normal. Sternotomy wires are noted. No mediastinal mass or adenopathy is noted.  CT ABDOMEN AND PELVIS FINDINGS  No gallstones are noted. The liver, spleen and pancreas appear normal. Adrenal glands and right kidney appear normal. Atherosclerosis of abdominal aorta is noted without aneurysm or dissection. Mild stenosis is noted at the origin of the celiac artery. There is noted focal ill-defined low density in the lateral portion of the midpole cortex of the left kidney with adjacent perinephric stranding. This measures 2.0 x 2.0 cm in size. It is uncertain if this represents focal pyelonephritis or possibly infarction. This is not visualized on prior exams. Also noted is 9 x 7 mm ill-defined low density in the upper pole of the left kidney which was not present on prior exam which potentially may represent neoplasm.  There is no evidence of bowel obstruction. No abnormal fluid collection is noted. Urinary bladder appears normal. No significant adenopathy is noted.  IMPRESSION: Left ventricular assist device is noted in the chest. No other significant abnormality is noted in the chest.  Atherosclerosis of abdominal aorta is noted without aneurysm or dissection.  Ill-defined low density measuring approximately 2 cm is noted in the lateral portion of the midpole cortex of the left kidney which was not present on prior exam. Mild perinephric stranding is noted adjacent to this area. It is uncertain if this represents focal renal infarction, possibly due to embolic disease, or focal pyelonephritis. Clinical correlation is recommended.  Also noted is 9 x 7 mm complex low density seen in upper pole of left kidney which was not present on prior exam  and potentially may represent focal inflammation, but neoplasm cannot be excluded and further evaluation with ultrasound is recommended.  These results will be called to the ordering clinician or representative by the Radiologist Assistant, and communication documented in the PACS or zVision Dashboard.   Electronically Signed   By: Marijo Conception, M.D.   On: 02/03/2015 13:56   US Renal  02/03/2015   CLINICAL DATA:  Abnormal appearance of the left kidney by CT scan earlier today. Fever of unknown origin.  EXAM: RENAL / URINARY TRACT ULTRASOUND COMPLETE  COMPARISON:  CT chest, abdomen and pelvis 02/03/2015.  FINDINGS: Right Kidney:  Length: 11.0 cm. Echogenicity within normal limits. No mass or hydronephrosis visualized.  Left Kidney:  Length: 11.7 cm. There is focal area of decreased cortical echogenicity and contour abnormality  in the midpole of the left kidney correlating with the finding on CT. The appearance is not suggestive of a mass. There is no hydronephrosis.  Bladder:  Appears normal for degree of bladder distention.  IMPRESSION: Abnormality in the midpole left kidney is better demonstrated on CT scan. Differential considerations remain the same, renal infarct or pyelonephritis. Recommend correlation with urinalysis.   Electronically Signed   By: Inge Rise M.D.   On: 02/03/2015 21:15     Medications:     Scheduled Medications: . amiodarone  200 mg Oral Daily  . amLODipine  2.5 mg Oral Daily  . atorvastatin  20 mg Oral QHS  . feeding supplement (ENSURE ENLIVE)  237 mL Oral TID BM  . insulin aspart  0-9 Units Subcutaneous TID WC  . insulin glargine  5 Units Subcutaneous QHS  . magnesium oxide  400 mg Oral Daily  . methocarbamol  500 mg Oral 4 times per day  . mirtazapine  7.5 mg Oral QHS  . pantoprazole  40 mg Oral Daily  . predniSONE  10 mg Oral QAC breakfast  . traMADol  50 mg Oral 4 times per day  . Warfarin - Pharmacist Dosing Inpatient   Does not apply q1800     Infusions:    PRN Medications: acetaminophen, albuterol, bisacodyl, midazolam   Assessment:   1. Fever and failure to thrive 2. Chronic systolic HF - Status post LVAD implantation for DT.  - 7/22 Speed turned down to 8600.  - 7/24 speed turned down to 8400 3. H/o Non Hodgkins Lymphoma s/p therapy- CT chest/abd/pelvis ok.  4.  H/O CVA 2008  5. H/O VT/VF --St Jude ICD 2010 - Interrogated device. No arrhythmias noted.   6. DMII- not on any meds. Glucose elevated on BMET.  Start checking CBGs.  7. 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 8. Recent ICH 5/16.  -stable no new neuro deficits. Head CT - no recurrence 9. Hyponatremia Na 127  10. Severe malnutrition in context of chronic illness 11. R heel pain--Xray ok .  12. Headache.   Afebrile over night. Heme/Onc following.   CT neg for ICH. CT abd/pelvis concern for mass L kidney. Plan for bone marrow aspirate today.   SR 108. [place] remain negative. Day 2 steroids. 7/23 TEE without vegetations.  EF 40-45%  Uric Acid 2.1 R heel Xray normal.    Check ehrlichiosis and RMSF in process.     ~20 PI events . Continue speed at 8400. ~ 20 PI events. Multiple PI events again overnight.   INR 1.43. On coumadin. Pharmacy dosing.   I reviewed the LVAD parameters from today, and compared the results to the patient's prior recorded data.  No programming changes were made.  The LVAD is functioning within specified parameters.  The patient performs LVAD self-test daily.  LVAD interrogation was negative for any significant power changes, alarms or PI events/speed drops.  LVAD equipment check completed and is in good working order.  Back-up equipment present.   LVAD education done on emergency procedures and precautions and reviewed exit site care.  Length of Stay: 7  CLEGG,AMY NP-C  02/04/2015, 8:57 AM  VAD Team --- VAD ISSUES ONLY--- Pager [phone] (7am - 7am)  Advanced Heart Failure Team  Pager [phone] (M-F; 7a - 4p)   Please contact [facility] for night-coverage after hours (4p -7a ) and weekends on [email or web address]  Patient seen and examined with Darrick Grinder, NP. We discussed all aspects of the encounter. I  agree with the assessment and plan as stated above.   Little change overall. For bone marrow biopsy today. VAD parameters unchanged. Still with frequent PI events. No low flow alarms.   Derik Fults,MD 12:02 AM

## 2015-02-04 NOTE — Progress Notes (Signed)
ANTICOAGULATION CONSULT NOTE - Follow Up Consult  Pharmacy Consult for Coumadin Indication: LVAD  Allergies  Allergen Reactions  . Hydralazine Other (See Comments)    Severe headaches     Patient Measurements: Height: 5\' 7"  (170.2 cm) Weight: 138 lb 0.1 oz (62.6 kg) IBW/kg (Calculated) : 66.1  Vital Signs: Temp: 98 F (36.7 C) (07/27 0740) Temp Source: Oral (07/27 0740) BP: 99/69 mmHg (07/27 1036) Pulse Rate: 79 (07/27 1036)  Labs:  Recent Labs  02/02/15 0235 02/03/15 0230 02/04/15 0301  HGB  --   --  9.5*  HCT  --   --  28.1*  PLT  --   --  329  LABPROT 22.4* 17.4* 17.5*  INR 1.98* 1.42 1.43  CREATININE 0.91 0.81 0.77    Estimated Creatinine Clearance: 95.6 mL/min (by C-G formula based on Cr of 0.77).  Assessment: 53yom on coumadin pta for his LVAD (10/13/2014), admitted s/p fall (did not hit his head). INR therapeutic on admission and coumadin resumed. INR today has remained stable at  SUBtherapeutic level of 1.43. Of note, dose entered for 7/24 not charted as given which is likely contributing to trend down. Aiming for a lower INR goal of 1.7-2.2 due to recent ICH (11/24/14).  Will continue to dose conservatively - seems to be more sensitive to warf than PTA. CBC, LDH are stable. No bleeding reported.  Goal of Therapy:  INR 1.7-2.2 Monitor platelets by anticoagulation protocol: Yes   Plan:  1) Coumadin 3 mg PO x 1 tonight 2) INR in AM  Thamara Leger K. Velva Harman, PharmD, [facility] Clinical Pharmacist Pager: [phone] Pharmacy: [phone] 02/04/2015 10:40 AM

## 2015-02-05 DIAGNOSIS — M79673 Pain in unspecified foot: Secondary | ICD-10-CM

## 2015-02-05 DIAGNOSIS — M25559 Pain in unspecified hip: Secondary | ICD-10-CM

## 2015-02-05 DIAGNOSIS — N2889 Other specified disorders of kidney and ureter: Secondary | ICD-10-CM | POA: Insufficient documentation

## 2015-02-05 DIAGNOSIS — C9591 Leukemia, unspecified, in remission: Secondary | ICD-10-CM

## 2015-02-05 DIAGNOSIS — M549 Dorsalgia, unspecified: Secondary | ICD-10-CM

## 2015-02-05 LAB — GLUCOSE, CAPILLARY
Glucose-Capillary: 182 mg/dL — ABNORMAL HIGH (ref 65–99)
Glucose-Capillary: 270 mg/dL — ABNORMAL HIGH (ref 65–99)
Glucose-Capillary: 322 mg/dL — ABNORMAL HIGH (ref 65–99)
Glucose-Capillary: 280 mg/dL — ABNORMAL HIGH (ref 65–99)

## 2015-02-05 LAB — BASIC METABOLIC PANEL
Anion gap: 9 (ref 5–15)
BUN: 18 mg/dL (ref 6–20)
CHLORIDE: 97 mmol/L — AB (ref 101–111)
CO2: 28 mmol/L (ref 22–32)
Calcium: 9.5 mg/dL (ref 8.9–10.3)
Creatinine, Ser: 0.85 mg/dL (ref 0.61–1.24)
GFR calc Af Amer: >60 mL/min (ref >60)
GFR calc non Af Amer: >60 mL/min (ref >60)
GLUCOSE: 241 mg/dL — AB (ref 65–99)
Potassium: 4.5 mmol/L (ref 3.5–5.1)
SODIUM: 134 mmol/L — AB (ref 135–145)

## 2015-02-05 LAB — PROTIME-INR
INR: 1.45 (ref 0.00–1.49)
Prothrombin Time: 17.7 seconds — ABNORMAL HIGH (ref 11.6–15.2)

## 2015-02-05 LAB — PSA: PSA: 0.09 ng/mL (ref 0.00–4.00)

## 2015-02-05 LAB — LACTATE DEHYDROGENASE: LDH: 304 U/L — ABNORMAL HIGH (ref 98–192)

## 2015-02-05 MED ORDER — WARFARIN SODIUM 5 MG PO TABS
5.0000 mg | ORAL_TABLET | Freq: Once | ORAL | Status: AC
  Administered 2015-02-05: 5 mg via ORAL
  Filled 2015-02-05: qty 1

## 2015-02-05 MED ORDER — INSULIN ASPART 100 UNIT/ML ~~LOC~~ SOLN
0.0000 [IU] | Freq: Every day | SUBCUTANEOUS | Status: DC
  Administered 2015-02-05 – 2015-02-06 (×2): 3 [IU] via SUBCUTANEOUS
  Administered 2015-02-15: 4 [IU] via SUBCUTANEOUS

## 2015-02-05 MED ORDER — INSULIN ASPART 100 UNIT/ML ~~LOC~~ SOLN
0.0000 [IU] | Freq: Three times a day (TID) | SUBCUTANEOUS | Status: DC
  Administered 2015-02-05: 7 [IU] via SUBCUTANEOUS
  Administered 2015-02-05: 5 [IU] via SUBCUTANEOUS
  Administered 2015-02-06: 2 [IU] via SUBCUTANEOUS
  Administered 2015-02-06: 3 [IU] via SUBCUTANEOUS
  Administered 2015-02-06 – 2015-02-07 (×2): 2 [IU] via SUBCUTANEOUS
  Administered 2015-02-08 – 2015-02-09 (×2): 3 [IU] via SUBCUTANEOUS
  Administered 2015-02-09: 1 [IU] via SUBCUTANEOUS
  Administered 2015-02-10 – 2015-02-11 (×4): 2 [IU] via SUBCUTANEOUS
  Administered 2015-02-11 (×2): 3 [IU] via SUBCUTANEOUS
  Administered 2015-02-12 – 2015-02-14 (×5): 2 [IU] via SUBCUTANEOUS
  Administered 2015-02-14 – 2015-02-15 (×2): 3 [IU] via SUBCUTANEOUS
  Administered 2015-02-15: 5 [IU] via SUBCUTANEOUS
  Administered 2015-02-15: 2 [IU] via SUBCUTANEOUS
  Administered 2015-02-16: 9 [IU] via SUBCUTANEOUS
  Administered 2015-02-16: 3 [IU] via SUBCUTANEOUS

## 2015-02-05 MED ORDER — INSULIN ASPART 100 UNIT/ML ~~LOC~~ SOLN
3.0000 [IU] | Freq: Three times a day (TID) | SUBCUTANEOUS | Status: DC
  Administered 2015-02-05 – 2015-02-16 (×23): 3 [IU] via SUBCUTANEOUS

## 2015-02-05 NOTE — Progress Notes (Addendum)
Patient ID: Andre Tran, male   DOB: [birth year], 54 y.o.   MRN: [id number]         [facility]    Date of Admission:  01/28/2015   Total days of antibiotics 0  Principal Problem:   Fever Active Problems:   Chronic systolic heart failure   LVAD (left ventricular assist device) present   Unexplained weight loss   Fall   Failure to thrive (0-17)   FUO (fever of unknown origin)   Anemia   . amiodarone  200 mg Oral Daily  . amLODipine  2.5 mg Oral Daily  . atorvastatin  20 mg Oral QHS  . feeding supplement (ENSURE ENLIVE)  237 mL Oral TID BM  . insulin aspart  0-5 Units Subcutaneous QHS  . insulin aspart  0-9 Units Subcutaneous TID WC  . insulin aspart  3 Units Subcutaneous TID WC  . insulin glargine  5 Units Subcutaneous QHS  . magnesium oxide  400 mg Oral Daily  . methocarbamol  500 mg Oral 4 times per day  . mirtazapine  7.5 mg Oral QHS  . pantoprazole  40 mg Oral Daily  . predniSONE  10 mg Oral QAC breakfast  . traMADol  50 mg Oral 4 times per day  . warfarin  5 mg Oral ONCE-1800  . Warfarin - Pharmacist Dosing Inpatient   Does not apply q1800    Subjective: [name] says his head is feeling better today. His hip and heel pain are improving as well. He remains afebrile, denies dysuria.  Review of Systems: Pertinent items are noted in HPI.  Past Medical History  Diagnosis Date  . Paroxysmal ventricular tachycardia 2005, 2010    s/p AICD '05, replaced w/ St. Jude's in 2010 (PVT/V.Fib arrest requiring ICD implant in 2005)  . HYPERTENSION, UNSPECIFIED   . HYPERLIPIDEMIA-MIXED   . 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 Nonischemic cardiomyopathy     2/2 Adriamycin administration for lymphoma  . CHF (congestive heart failure)     EF 15% by echo 2009; s/p St. Jude ICD  . Diabetes mellitus     Type 2  . Cancer 1992, 2007     non  hodkins lymphoma 1992, Hodgkins 2007  . Depression   . LV (left ventricular) mural thrombus 2009    2009, on coumadin  . Small bowel obstruction 2001    s/p Small bowel resection 2001  . Jejunal intussusception 2008    2008  . 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 Splenic mass 2009    Noted on Abd [place] 2009 w/ recs for f/u CT - not done  . Hypotension   . Noncompliance with medication regimen   . AICD (automatic cardioverter/defibrillator) present 2005, 2010  . Presence of permanent cardiac pacemaker   . Renal insufficiency     History  Substance Use Topics  . Smoking status: Never Smoker   . Smokeless tobacco: Never Used  . Alcohol Use: 1.2 oz/week    1 Cans of beer, 1 Shots of liquor per week     Comment: ON SPECIAL OCCASIONS    Family History  Problem Relation Age of Onset  . Other      No known family h/o heart disease  . Heart disease    . Diabetes Mother   .  Other Other     complications from hip replacement   Allergies  Allergen Reactions  . Hydralazine Other (See Comments)    Severe headaches     OBJECTIVE: Blood pressure 83/66, pulse 68, temperature 97.5 F (36.4 C), temperature source Oral, resp. rate 17, height $RemoveBe'5\' 7"'vGlyUKUjK$  (1.702 m), weight 61.78 kg (136 lb 3.2 oz), SpO2 99 %. General: Sitting in bed drinking hot tea. Skin: Xerosis, no lesions. Lungs: Clear anteriorly. Cor: Regular rate. LVAD humming. Abdomen: Soft, non-tender. CVA negative.  Lab Results Lab Results  Component Value Date   WBC 9.2 02/04/2015   HGB 9.5* 02/04/2015   HCT 28.1* 02/04/2015   MCV 84.1 02/04/2015   PLT 329 02/04/2015    Lab Results  Component Value Date   CREATININE 0.85 02/05/2015   BUN 18 02/05/2015   NA 134* 02/05/2015   K 4.5 02/05/2015   CL 97* 02/05/2015   CO2 28 02/05/2015    Lab Results  Component Value Date   ALT 80* 01/30/2015   AST 122* 01/30/2015   ALKPHOS 149* 01/30/2015   BILITOT 0.8 01/30/2015      Microbiology: Recent Results (from the past 240 hour(s))  Culture, blood (routine x 2)     Status: None   Collection Time: 01/30/15  4:58 PM  Result Value Ref Range Status   Specimen Description BLOOD RIGHT ANTECUBITAL  Final   Special Requests BOTTLES DRAWN AEROBIC ONLY 6CC  Final   Culture NO GROWTH 5 DAYS  Final   Report Status 02/04/2015 FINAL  Final  Culture, blood (routine x 2)     Status: None   Collection Time: 01/30/15  5:10 PM  Result Value Ref Range Status   Specimen Description BLOOD RIGHT ANTECUBITAL  Final   Special Requests BOTTLES DRAWN AEROBIC ONLY 3CC  Final   Culture NO GROWTH 5 DAYS  Final   Report Status 02/04/2015 FINAL  Final  Culture, Urine     Status: None (Preliminary result)   Collection Time: 02/05/15 12:15 PM  Result Value Ref Range Status   Specimen Description URINE, RANDOM  Final   Special Requests NONE  Final   Culture PENDING  Incomplete   Report Status PENDING  Incomplete    Assessment: Andre Tran remains afebrile however the source of his previous fever remains unknown. His bone marrow biopsy did not show recurrence of his leukemia. There was note of an ill-defined 2cm mass on the lateral portion of the left kidney on abdominal CT performed 7/26 which was new compared to 7/20. Per radiology report, the differential includes focal renal infarction vs. pyelonephritis. There was no evidence of clot or vegetation on TEE performed 7/23 and he is currently anticoagulated with warfarin. A urine culture was ordered to evaluate for UTI. He denies dysuria and complains only of isolated back pain over his sacral tuberosity from his fall.  Plan: 1. Continue to hold antibiotics 2. Will follow urine culture  ADDENDUM: agree with Dr. Melburn Hake note.  Intermittent fever with no noted source. Afebrile over 48 hours. Bone marrow done, protein electrophoresis sent.  No signs of infection.    Scharlene Gloss, MD  [facility] Chance, [facility] 02/05/2015, 2:06 PM

## 2015-02-05 NOTE — Care Management Important Message (Signed)
Important Message  Patient Details  Name: Andre Tran MRN: [id number] Date of Birth: 1961-03-25   Medicare Important Message Given:  Yes-third notification given    Pricilla Handler 02/05/2015, 12:03 PM

## 2015-02-05 NOTE — Progress Notes (Signed)
Inpatient Diabetes Program Recommendations  AACE/ADA: New Consensus Statement on Inpatient Glycemic Control (2013)  Target Ranges:  Prepandial:   less than 140 mg/dL      Peak postprandial:   less than 180 mg/dL (1-2 hours)      Critically ill patients:  140 - 180 mg/dL   Results for KYRE, JEFFRIES (MRN [id number]) as of 02/05/2015 10:43  Ref. Range 02/04/2015 07:36 02/04/2015 13:11 02/04/2015 16:20 02/04/2015 21:24 02/05/2015 08:16  Glucose-Capillary Latest Ref Range: 65-99 mg/dL 180 (H) 332 (H) 359 (H) 296 (H) 182 (H)   Diabetes history: DM 2 Outpatient Diabetes medications: Lantus 5 units Current orders for Inpatient glycemic control: Lantus 5 units, Novolog Sensitive scale  Inpatient Diabetes Program Recommendations  Insulin - Meal Coverage: Glucose into the 300's at meal time. Please consider Novolog 4 units TID meal coverage in addition to correction scale if patient is eating. Also order HS scale. Diet: Please change diet to carb modified.  Thanks,  Tama Headings RN, MSN, [facility] Inpatient Diabetes Coordinator Team Pager [phone]

## 2015-02-05 NOTE — Discharge Instructions (Addendum)
Information on my medicine - ELIQUIS (apixaban)  This medication education was reviewed with me or my healthcare representative as part of my discharge preparation.  The pharmacist that spoke with me during my hospital stay was:  Deboraha Sprang, [facility]  Why was Eliquis prescribed for you? Eliquis was prescribed for you to reduce the risk of forming blood clots that can cause a stroke.  What do You need to know about Eliquis ? Take your Eliquis TWICE DAILY - one tablet in the morning and one tablet in the evening with or without food.  It would be best to take the doses about the same time each day.  If you have difficulty swallowing the tablet whole please discuss with your pharmacist how to take the medication safely.  Take Eliquis exactly as prescribed by your doctor and DO NOT stop taking Eliquis without talking to the doctor who prescribed the medication.  Stopping may increase your risk of developing a new clot or stroke.  Refill your prescription before you run out.  After discharge, you should have regular check-up appointments with your healthcare provider that is prescribing your Eliquis.  In the future your dose may need to be changed if your kidney function or weight changes by a significant amount or as you get older.  What do you do if you miss a dose? If you miss a dose, take it as soon as you remember on the same day and resume taking twice daily.  Do not take more than one dose of ELIQUIS at the same time.  Important Safety Information A possible side effect of Eliquis is bleeding. You should call your healthcare provider right away if you experience any of the following: ? Bleeding from an injury or your nose that does not stop. ? Unusual colored urine (red or dark brown) or unusual colored stools (red or black). ? Unusual bruising for unknown reasons. ? A serious fall or if you hit your head (even if there is no bleeding).  Some medicines may interact with  Eliquis and might increase your risk of bleeding or clotting while on Eliquis. To help avoid this, consult your healthcare provider or pharmacist prior to using any new prescription or non-prescription medications, including herbals, vitamins, non-steroidal anti-inflammatory drugs (NSAIDs) and supplements.  This website has more information on Eliquis (apixaban): [email or web address].

## 2015-02-05 NOTE — Progress Notes (Signed)
Admitted 01/28/15 due to falls and failure to thrive.  HeartMate II LVAD implated on 10/13/14 by Dr. Cyndia Bent.   Vital signs: Temp:  97.5 - 98.2 (no further fevers) HR:  65 - 78 Doppler MAP: 88 - 108 O2 Sat:  97 % RA Weight in lbs: 134 > 138 > 140 > 138 > 135 > 139 > 136 > 138 > 136  Telemetry:  SR with 1st degree AV block  LVAD interrogation reveals:  Speed:  8400 (dropped 02/02/15 d/t PI events) Flow:  4.2 Power: 4.2 PI: 6.7 Alarms:  none Events:  7 PI events today, 6 PI events yesterday, > 20 on Tuesday Fixed speed:  8400 Low speed limit: 8000 (< 8000 will cause regurgitant flow)  Drive Line: Dressing C/D/I and WNL.  Next dressing to be changed 02/09/15  Labs:  LDH trend: 446 > 322 > 302 > 341 > 344 > 359 > 392 > 298 > 304  INR trend:  2.48 > 2.92 > 3.48 > 2.41 > 2.19 > 1.98 > 1.42 > 1.43 > 1.45   Reports that he feels "up and down" today. Is not able to tolerate walking with hip pain and appears to have significant pain with any movement or repositioning still (active or passive). Amy consulted orthopedic team today. BP elevated at 100 - 108 last several recorded readings. Historically we have been able to check a conventional automatic BP cuff and they correlate so suspect this to be systolic and not MAP (LVEF 40 - 45% and recently decreased speed would expect this higher pulsatility).    Plan/Recommendations as discussed with team: 1. Complaining of a lot of left hip/side pain today. Is not walking with rehab currently. Ortho referral from VAD team.    2. LDH trending back down. INR still subtherapeutic < 1.5 x 3 days. No power elevations recorded.   3. Bone marrow biopsy in process.   4. Appetite seems better today. Ate all of his eggs and fruit for breakfast this morning.

## 2015-02-05 NOTE — Progress Notes (Signed)
ANTICOAGULATION CONSULT NOTE - Follow Up Consult  Pharmacy Consult for Coumadin Indication: LVAD  Allergies  Allergen Reactions  . Hydralazine Other (See Comments)    Severe headaches     Patient Measurements: Height: 5\' 7"  (170.2 cm) Weight: 136 lb 3.2 oz (61.78 kg) IBW/kg (Calculated) : 66.1  Vital Signs: Temp: 97.5 F (36.4 C) (07/28 1110) Temp Source: Oral (07/28 1110) Pulse Rate: 68 (07/28 0315)  Labs:  Recent Labs  02/03/15 0230 02/04/15 0301 02/05/15 0220  HGB  --  9.5*  --   HCT  --  28.1*  --   PLT  --  329  --   LABPROT 17.4* 17.5* 17.7*  INR 1.42 1.43 1.45  CREATININE 0.81 0.77 0.85    Estimated Creatinine Clearance: 88.9 mL/min (by C-G formula based on Cr of 0.85).  Assessment: 53yom on coumadin pta for his LVAD (10/13/2014), admitted s/p fall (did not hit his head). INR therapeutic on admission and coumadin resumed. INR today has remained stable at  SUBtherapeutic level of 1.45. Of note, dose entered for 7/24 not charted as given which is likely contributing to trend down, also now seems to be eating better and receiving Ensure Enlive which contains vitamin K. Aiming for a lower INR goal of 1.7-2.2 due to recent ICH (11/24/14). CBC, LDH are stable. No bleeding reported.  Home Coumadin dose: 2.5 mg PO daily except 5 mg on MF  Goal of Therapy:  INR 1.7-2.2 Monitor platelets by anticoagulation protocol: Yes   Plan:  1) Coumadin 5 mg PO x 1 tonight 2) INR in AM  Leva Baine K. Velva Harman, PharmD, [place] Clinical Pharmacist Pager: [phone] Pharmacy: [phone] 02/05/2015 11:42 AM

## 2015-02-05 NOTE — Progress Notes (Signed)
HeartMate 2 Rounding Note  Subjective:    Admitted after fall and failure to thrive and paroxysmal fevers. Recent 30 pound weight loss and anorexia. ESR 108. CT C/A/P no evidence of recurrent lymphoma or abscess. LDH tending up 344>359>392 .   TEE 7/23 no vegetations 7/25 VAD speed turned down to 8400  7/26 CT Abd Chest- Concern for mass L kidney.  7/27- Bone marrow aspirate.no evidence of clear-cut leukemia, lymphoma or MDS at this time   Afebrile over night.  Complaining of L hip pain.   LVAD INTERROGATION:  HeartMate II LVAD:  Flow 4.0  liters/min, speed 8400, power 4.0, PI 6.3   ~20 PI events   Objective:    Vital Signs:   Temp:  [97.4 F (36.3 C)-98.2 F (36.8 C)] 97.4 F (36.3 C) (07/28 0818) Pulse Rate:  [68-85] 68 (07/28 0315) Resp:  [16-23] 19 (07/28 0800) BP: (83-86)/(66-67) 83/66 mmHg (07/27 1600) SpO2:  [98 %-100 %] 99 % (07/28 0800) Weight:  [136 lb 3.2 oz (61.78 kg)] 136 lb 3.2 oz (61.78 kg) (07/28 0315) Last BM Date: 01/27/15 Mean arterial Pressure 70-80s  Intake/Output:   Intake/Output Summary (Last 24 hours) at 02/05/15 1041 Last data filed at 02/05/15 0800  Gross per 24 hour  Intake    600 ml  Output    825 ml  Net   -225 ml     Physical Exam: General: In bed . Fatigued appearing.  HEENT: normal Neck: supple. JVP 5-6  Carotids 2+ bilat; no bruits. No lymphadenopathy or thryomegaly appreciated. Cor: Mechanical heart sounds with LVAD hum present. Lungs: clear Abdomen: soft, nontender, nondistended. No hepatosplenomegaly. No bruits or masses. Good bowel sounds. Driveline: C/D/I; securement device intact and driveline incorporated Extremities: no cyanosis, clubbing, rash, edema  Neuro: alert & orientedx3, cranial nerves grossly intact. moves all 4 extremities w/o difficulty. Affect flat  Telemetry: SR 80s   Labs: Basic Metabolic Panel:  Recent Labs Lab 02/01/15 0258 02/02/15 0235 02/03/15 0230 02/04/15 0301 02/05/15 0220  NA 133* 131* 130*  127* 134*  K 4.1 4.0 4.7 4.8 4.5  CL 98* 97* 97* 94* 97*  CO2 $Re'28 26 25 26 28  'Oho$ GLUCOSE 166* 249* 172* 207* 241*  BUN $Re'12 15 13 10 18  'SnD$ CREATININE 1.06 0.91 0.81 0.77 0.85  CALCIUM 8.7* 8.7* 8.6* 8.9 9.5    Liver Function Tests:  Recent Labs Lab 01/30/15 0944  AST 122*  ALT 80*  ALKPHOS 149*  BILITOT 0.8  PROT 7.9  ALBUMIN 3.5   No results for input(s): LIPASE, AMYLASE in the last 168 hours. No results for input(s): AMMONIA in the last 168 hours.  CBC:  Recent Labs Lab 02/01/15 0258 02/04/15 0301  WBC 9.7 9.2  HGB 9.8* 9.5*  HCT 29.2* 28.1*  MCV 84.4 84.1  PLT 274 329    INR:  Recent Labs Lab 02/01/15 0258 02/02/15 0235 02/03/15 0230 02/04/15 0301 02/05/15 0220  INR 2.19* 1.98* 1.42 1.43 1.45    Other results:    Imaging: Ct Chest W Contrast  02/03/2015   CLINICAL DATA:  Fever.  Personal history of non-Hodgkin's lymphoma.  EXAM: CT CHEST, ABDOMEN, AND PELVIS WITH CONTRAST  TECHNIQUE: Multidetector CT imaging of the chest, abdomen and pelvis was performed following the standard protocol during bolus administration of intravenous contrast.  CONTRAST:  156mL OMNIPAQUE IOHEXOL 300 MG/ML  SOLN  COMPARISON:  CT scan of chest of January 28, 2015; CT scan of abdomen and pelvis of June 20, 2014. CT scan  of chest, abdomen and pelvis of January 22, 2013.  FINDINGS: CT CHEST FINDINGS  No pneumothorax or pleural effusion is noted. No acute pulmonary disease is noted. Left ventricular assist device is noted, which extends from the apex of the left ventricle to the surgically created anastomosis in the ascending thoracic aorta. Left-sided pacemaker is again noted. There is no evidence of thoracic aortic dissection or aneurysm. Visualized portions of pulmonary arteries appear normal. Sternotomy wires are noted. No mediastinal mass or adenopathy is noted.  CT ABDOMEN AND PELVIS FINDINGS  No gallstones are noted. The liver, spleen and pancreas appear normal. Adrenal glands and right  kidney appear normal. Atherosclerosis of abdominal aorta is noted without aneurysm or dissection. Mild stenosis is noted at the origin of the celiac artery. There is noted focal ill-defined low density in the lateral portion of the midpole cortex of the left kidney with adjacent perinephric stranding. This measures 2.0 x 2.0 cm in size. It is uncertain if this represents focal pyelonephritis or possibly infarction. This is not visualized on prior exams. Also noted is 9 x 7 mm ill-defined low density in the upper pole of the left kidney which was not present on prior exam which potentially may represent neoplasm.  There is no evidence of bowel obstruction. No abnormal fluid collection is noted. Urinary bladder appears normal. No significant adenopathy is noted.  IMPRESSION: Left ventricular assist device is noted in the chest. No other significant abnormality is noted in the chest.  Atherosclerosis of abdominal aorta is noted without aneurysm or dissection.  Ill-defined low density measuring approximately 2 cm is noted in the lateral portion of the midpole cortex of the left kidney which was not present on prior exam. Mild perinephric stranding is noted adjacent to this area. It is uncertain if this represents focal renal infarction, possibly due to embolic disease, or focal pyelonephritis. Clinical correlation is recommended.  Also noted is 9 x 7 mm complex low density seen in upper pole of left kidney which was not present on prior exam and potentially may represent focal inflammation, but neoplasm cannot be excluded and further evaluation with ultrasound is recommended.  These results will be called to the ordering clinician or representative by the Radiologist Assistant, and communication documented in the PACS or zVision Dashboard.   Electronically Signed   By: Marijo Conception, M.D.   On: 02/03/2015 13:56   Ct Abdomen Pelvis W Contrast  02/03/2015   CLINICAL DATA:  Fever.  Personal history of non-Hodgkin's  lymphoma.  EXAM: CT CHEST, ABDOMEN, AND PELVIS WITH CONTRAST  TECHNIQUE: Multidetector CT imaging of the chest, abdomen and pelvis was performed following the standard protocol during bolus administration of intravenous contrast.  CONTRAST:  155mL OMNIPAQUE IOHEXOL 300 MG/ML  SOLN  COMPARISON:  CT scan of chest of January 28, 2015; CT scan of abdomen and pelvis of June 20, 2014. CT scan of chest, abdomen and pelvis of January 22, 2013.  FINDINGS: CT CHEST FINDINGS  No pneumothorax or pleural effusion is noted. No acute pulmonary disease is noted. Left ventricular assist device is noted, which extends from the apex of the left ventricle to the surgically created anastomosis in the ascending thoracic aorta. Left-sided pacemaker is again noted. There is no evidence of thoracic aortic dissection or aneurysm. Visualized portions of pulmonary arteries appear normal. Sternotomy wires are noted. No mediastinal mass or adenopathy is noted.  CT ABDOMEN AND PELVIS FINDINGS  No gallstones are noted. The liver, spleen and pancreas appear  normal. Adrenal glands and right kidney appear normal. Atherosclerosis of abdominal aorta is noted without aneurysm or dissection. Mild stenosis is noted at the origin of the celiac artery. There is noted focal ill-defined low density in the lateral portion of the midpole cortex of the left kidney with adjacent perinephric stranding. This measures 2.0 x 2.0 cm in size. It is uncertain if this represents focal pyelonephritis or possibly infarction. This is not visualized on prior exams. Also noted is 9 x 7 mm ill-defined low density in the upper pole of the left kidney which was not present on prior exam which potentially may represent neoplasm.  There is no evidence of bowel obstruction. No abnormal fluid collection is noted. Urinary bladder appears normal. No significant adenopathy is noted.  IMPRESSION: Left ventricular assist device is noted in the chest. No other significant abnormality is  noted in the chest.  Atherosclerosis of abdominal aorta is noted without aneurysm or dissection.  Ill-defined low density measuring approximately 2 cm is noted in the lateral portion of the midpole cortex of the left kidney which was not present on prior exam. Mild perinephric stranding is noted adjacent to this area. It is uncertain if this represents focal renal infarction, possibly due to embolic disease, or focal pyelonephritis. Clinical correlation is recommended.  Also noted is 9 x 7 mm complex low density seen in upper pole of left kidney which was not present on prior exam and potentially may represent focal inflammation, but neoplasm cannot be excluded and further evaluation with ultrasound is recommended.  These results will be called to the ordering clinician or representative by the Radiologist Assistant, and communication documented in the PACS or zVision Dashboard.   Electronically Signed   By: Marijo Conception, M.D.   On: 02/03/2015 13:56   US Renal  02/03/2015   CLINICAL DATA:  Abnormal appearance of the left kidney by CT scan earlier today. Fever of unknown origin.  EXAM: RENAL / URINARY TRACT ULTRASOUND COMPLETE  COMPARISON:  CT chest, abdomen and pelvis 02/03/2015.  FINDINGS: Right Kidney:  Length: 11.0 cm. Echogenicity within normal limits. No mass or hydronephrosis visualized.  Left Kidney:  Length: 11.7 cm. There is focal area of decreased cortical echogenicity and contour abnormality in the midpole of the left kidney correlating with the finding on CT. The appearance is not suggestive of a mass. There is no hydronephrosis.  Bladder:  Appears normal for degree of bladder distention.  IMPRESSION: Abnormality in the midpole left kidney is better demonstrated on CT scan. Differential considerations remain the same, renal infarct or pyelonephritis. Recommend correlation with urinalysis.   Electronically Signed   By: Inge Rise M.D.   On: 02/03/2015 21:15   Ct Biopsy  02/04/2015    INDICATION: History of lymphoma and anemia. Please perform CT-guided biopsy for tissue diagnostic purposes  EXAM: CT GUIDED BONE MARROW BIOPSY AND ASPIRATION  MEDICATIONS: Fentanyl 25 mcg IV; Versed 0.5 mg IV  ANESTHESIA/SEDATION: Sedation Time  10 minutes  CONTRAST:  None  COMPLICATIONS: None immediate.  PROCEDURE: Informed consent was obtained from the patient following an explanation of the procedure, risks, benefits and alternatives. The patient understands, agrees and consents for the procedure. All questions were addressed. A time out was performed prior to the initiation of the procedure. The patient was positioned right lateral decubitus on the CT gantry given the presence of his LVAD device. Non-contrast localization CT was performed of the pelvis to demonstrate the iliac marrow spaces. The operative site was  prepped and draped in the usual sterile fashion.  Under sterile conditions and local anesthesia, a 22 gauge spinal needle was utilized for procedural planning. Next, an 11 gauge coaxial bone biopsy needle was advanced into the right iliac marrow space. Needle position was confirmed with CT imaging. Initially, bone marrow aspiration was performed. Next, a bone marrow biopsy was obtained with the 11 gauge outer bone marrow device. Samples were prepared with the cytotechnologist and deemed adequate. The needle was removed intact. Hemostasis was obtained with compression and a dressing was placed. The patient tolerated the procedure well without immediate post procedural complication.  IMPRESSION: Successful CT guided right iliac bone marrow aspiration and core biopsies.   Electronically Signed   By: Sandi Mariscal M.D.   On: 02/04/2015 12:24     Medications:     Scheduled Medications: . amiodarone  200 mg Oral Daily  . amLODipine  2.5 mg Oral Daily  . atorvastatin  20 mg Oral QHS  . feeding supplement (ENSURE ENLIVE)  237 mL Oral TID BM  . insulin aspart  0-9 Units Subcutaneous TID WC  . insulin  glargine  5 Units Subcutaneous QHS  . magnesium oxide  400 mg Oral Daily  . methocarbamol  500 mg Oral 4 times per day  . mirtazapine  7.5 mg Oral QHS  . pantoprazole  40 mg Oral Daily  . predniSONE  10 mg Oral QAC breakfast  . traMADol  50 mg Oral 4 times per day  . Warfarin - Pharmacist Dosing Inpatient   Does not apply q1800    Infusions:    PRN Medications: acetaminophen, albuterol, bisacodyl, midazolam   Assessment:   1. Fever and failure to thrive 2. Chronic systolic HF - Status post LVAD implantation for DT.  - 7/22 Speed turned down to 8600.  - 7/24 speed turned down to 8400 3. H/o Non Hodgkins Lymphoma s/p therapy- CT chest/abd/pelvis ok.  4.  H/O CVA 2008  5. H/O VT/VF --St Jude ICD 2010 - Interrogated device. No arrhythmias noted.   6. DMII- not on any meds. Uncontrolled DM.  7. 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 8. Recent ICH 5/16.  -stable no new neuro deficits. Head CT - no recurrence 9. Hyponatremia Na 127  10. Severe malnutrition in context of chronic illness 11. R heel pain--Xray ok .  12. Headache 13. L hip pain-   Afebrile over night. Heme/Onc following.   CT neg for ICH. CT abd/pelvis concern for mass L kidney. Renal ultrasound - ? Renal infarct versus pyelonephritis. Check UA. Refer to Urology   Per Oncology Bone  Marrow biopsy-->no evidence of clear-cut leukemia, lymphoma or MDS at this times  SR 108. [place] remain negative. Day 3 steroids. 7/23 TEE without vegetations.  EF 40-45%   Uric Acid 2.1 R heel Xray normal.   L hip pain- Refer to ortho   ~20 PI events . Continue speed at 8400. ~ 20 PI events. Multiple PI events again overnight.   INR 1.45. On coumadin. Pharmacy dosing.   CBGS uncontrolled- add 4 units Novolog tid.   I reviewed the LVAD parameters from today, and compared the results to the patient's prior recorded data.  No programming changes were made.  The LVAD is functioning within specified parameters.  The  patient performs LVAD self-test daily.  LVAD interrogation was negative for any significant power changes, alarms or PI events/speed drops.  LVAD equipment check completed and is in good working order.  Back-up equipment present.  LVAD education done on emergency procedures and precautions and reviewed exit site care.  Length of Stay: 8  CLEGG,AMY NP-C  02/05/2015, 10:41 AM  VAD Team --- VAD ISSUES ONLY--- Pager [phone] (7am - 7am)  Advanced Heart Failure Team  Pager [phone] (M-F; 7a - 4p)  Please contact [facility] for night-coverage after hours (4p -7a ) and weekends on [email or web address]  Patient seen and examined with Darrick Grinder, NP. We discussed all aspects of the encounter. I agree with the assessment and plan as stated above.   Remains on low dose steroids. Afebrile overnight. Bone marrow bx unrevealing. CT scan reviewed wit radiology and urology and left upper pole lesion is exophytic and concerning for malignancy. Will ask IR to biopsy. Also continue with severe R hip pain. Will ask ortho to evaluate, Still with PI events on VAD interrogation. No low flow alarms.   Jalyne Brodzinski,MD 10:41 PM

## 2015-02-05 NOTE — Progress Notes (Signed)
PT Cancellation Note  Patient Details Name: Andre Tran MRN: [id number] DOB: [birth year]   Cancelled Treatment:    Reason Eval/Treat Not Completed: Pain limiting ability to participate (pt continues to report pain right foot with any weight bearing and pain left hip with all  PROM and AROM and denied mobility today) Pt educated for stretching right foot.   Lanetta Inch Beth 02/05/2015, 1:23 PM Elwyn Reach, [place]

## 2015-02-05 NOTE — Progress Notes (Signed)
Events in the last few days noted. Patient underwent a bone marrow biopsy on 02/04/2015. The results of the bone marrow pathology were discussed with Dr. Gari Crown, the reviewing pathologist. There was no evidence of clear-cut leukemia, lymphoma or MDS at this time. Most of the changes were likely reactive in nature and not very specific.  Based on the bone marrow biopsy findings, CT scans and laboratory testing I see no clear-cut hematological disorder contributing to his symptoms.  Please call with any questions regarding this pleasant gentleman.

## 2015-02-06 ENCOUNTER — Other Ambulatory Visit (HOSPITAL_COMMUNITY): Payer: Medicare Other

## 2015-02-06 ENCOUNTER — Ambulatory Visit (HOSPITAL_COMMUNITY): Payer: Medicare Other

## 2015-02-06 LAB — BASIC METABOLIC PANEL
ANION GAP: 7 (ref 5–15)
BUN: 18 mg/dL (ref 6–20)
CALCIUM: 9.5 mg/dL (ref 8.9–10.3)
CO2: 30 mmol/L (ref 22–32)
Chloride: 96 mmol/L — ABNORMAL LOW (ref 101–111)
Creatinine, Ser: 0.93 mg/dL (ref 0.61–1.24)
GFR calc Af Amer: >60 mL/min (ref >60)
GFR calc non Af Amer: >60 mL/min (ref >60)
Glucose, Bld: 175 mg/dL — ABNORMAL HIGH (ref 65–99)
Potassium: 4.2 mmol/L (ref 3.5–5.1)
SODIUM: 133 mmol/L — AB (ref 135–145)

## 2015-02-06 LAB — PROTEIN ELECTROPHORESIS, SERUM
A/G Ratio: 0.7 (ref 0.7–1.7)
Albumin ELP: 2.8 g/dL — ABNORMAL LOW (ref 2.9–4.4)
Alpha-1-Globulin: 0.4 g/dL (ref 0.0–0.4)
Alpha-2-Globulin: 0.7 g/dL (ref 0.4–1.0)
Beta Globulin: 1.1 g/dL (ref 0.7–1.3)
Gamma Globulin: 1.5 g/dL (ref 0.4–1.8)
Globulin, Total: 3.8 g/dL (ref 2.2–3.9)
Total Protein ELP: 6.6 g/dL (ref 6.0–8.5)

## 2015-02-06 LAB — URINE CULTURE: Culture: 2000

## 2015-02-06 LAB — GLUCOSE, CAPILLARY
GLUCOSE-CAPILLARY: 190 mg/dL — AB (ref 65–99)
Glucose-Capillary: 186 mg/dL — ABNORMAL HIGH (ref 65–99)
Glucose-Capillary: 236 mg/dL — ABNORMAL HIGH (ref 65–99)
Glucose-Capillary: 278 mg/dL — ABNORMAL HIGH (ref 65–99)

## 2015-02-06 LAB — LACTATE DEHYDROGENASE: LDH: 270 U/L — AB (ref 98–192)

## 2015-02-06 LAB — PROTIME-INR
INR: 1.48 (ref 0.00–1.49)
Prothrombin Time: 18 seconds — ABNORMAL HIGH (ref 11.6–15.2)

## 2015-02-06 LAB — SEDIMENTATION RATE: Sed Rate: 65 mm/hr — ABNORMAL HIGH (ref 0–16)

## 2015-02-06 MED ORDER — GLUCERNA SHAKE PO LIQD
237.0000 mL | Freq: Three times a day (TID) | ORAL | Status: DC
Start: 2015-02-06 — End: 2015-02-16
  Administered 2015-02-06 – 2015-02-14 (×8): 237 mL via ORAL

## 2015-02-06 MED ORDER — FENTANYL CITRATE (PF) 100 MCG/2ML IJ SOLN
25.0000 ug | INTRAMUSCULAR | Status: DC | PRN
  Administered 2015-02-06: 50 ug via INTRAVENOUS

## 2015-02-06 MED ORDER — WARFARIN SODIUM 5 MG PO TABS
5.0000 mg | ORAL_TABLET | Freq: Once | ORAL | Status: DC
  Filled 2015-02-06: qty 1

## 2015-02-06 MED ORDER — APIXABAN 5 MG PO TABS
5.0000 mg | ORAL_TABLET | Freq: Two times a day (BID) | ORAL | Status: DC
  Administered 2015-02-06 – 2015-02-16 (×21): 5 mg via ORAL
  Filled 2015-02-06 (×22): qty 1

## 2015-02-06 MED ORDER — FENTANYL CITRATE (PF) 100 MCG/2ML IJ SOLN
INTRAMUSCULAR | Status: AC
  Filled 2015-02-06: qty 2

## 2015-02-06 NOTE — Progress Notes (Signed)
Patient ID: Andre Tran, male   DOB: [birth year], 54 y.o.   MRN: [id number]         [facility]    Date of Admission:  01/28/2015   Total days of antibiotics 0  Principal Problem:   Fever Active Problems:   Chronic systolic heart failure   LVAD (left ventricular assist device) present   Unexplained weight loss   Fall   Failure to thrive (0-17)   FUO (fever of unknown origin)   Anemia   Left kidney mass   . amiodarone  200 mg Oral Daily  . amLODipine  2.5 mg Oral Daily  . apixaban  5 mg Oral BID  . atorvastatin  20 mg Oral QHS  . feeding supplement (GLUCERNA SHAKE)  237 mL Oral TID BM  . insulin aspart  0-5 Units Subcutaneous QHS  . insulin aspart  0-9 Units Subcutaneous TID WC  . insulin aspart  3 Units Subcutaneous TID WC  . insulin glargine  5 Units Subcutaneous QHS  . magnesium oxide  400 mg Oral Daily  . methocarbamol  500 mg Oral 4 times per day  . mirtazapine  7.5 mg Oral QHS  . pantoprazole  40 mg Oral Daily  . traMADol  50 mg Oral 4 times per day    Subjective: Doing well, no fever, chills  Review of Systems: Pertinent items are noted in HPI.  No diarrhea  Past Medical History  Diagnosis Date  . Paroxysmal ventricular tachycardia 2005, 2010    s/p AICD '05, replaced w/ St. Jude's in 2010 (PVT/V.Fib arrest requiring ICD implant in 2005)  . HYPERTENSION, UNSPECIFIED   . HYPERLIPIDEMIA-MIXED   . 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 Nonischemic cardiomyopathy     2/2 Adriamycin administration for lymphoma  . CHF (congestive heart failure)     EF 15% by echo 2009; s/p St. Jude ICD  . Diabetes mellitus     Type 2  . Cancer 1992, 2007     non hodkins lymphoma 1992, Hodgkins 2007  . Depression   . LV (left ventricular) mural thrombus 2009    2009, on coumadin  . Small bowel obstruction 2001    s/p Small bowel  resection 2001  . Jejunal intussusception 2008    2008  . 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 Splenic mass 2009    Noted on Abd [place] 2009 w/ recs for f/u CT - not done  . Hypotension   . Noncompliance with medication regimen   . AICD (automatic cardioverter/defibrillator) present 2005, 2010  . Presence of permanent cardiac pacemaker   . Renal insufficiency     History  Substance Use Topics  . Smoking status: Never Smoker   . Smokeless tobacco: Never Used  . Alcohol Use: 1.2 oz/week    1 Cans of beer, 1 Shots of liquor per week     Comment: ON SPECIAL OCCASIONS    Family History  Problem Relation Age of Onset  . Other      No known family h/o heart disease  . Heart disease    . Diabetes Mother   . Other Other     complications from hip replacement   Allergies  Allergen Reactions  . Hydralazine Other (See Comments)    Severe headaches  OBJECTIVE: Blood pressure 83/66, pulse 68, temperature 98.4 F (36.9 C), temperature source Oral, resp. rate 20, height $RemoveBe'5\' 7"'OmipflXoJ$  (1.702 m), weight 137 lb 8 oz (62.37 kg), SpO2 98 %. General: Sitting in bed drinking hot tea. Skin: Xerosis, no lesions. Lungs: Clear anteriorly. Cor: Regular rate. LVAD humming. Abdomen: Soft, non-tender. CVA negative.  Lab Results Lab Results  Component Value Date   WBC 9.2 02/04/2015   HGB 9.5* 02/04/2015   HCT 28.1* 02/04/2015   MCV 84.1 02/04/2015   PLT 329 02/04/2015    Lab Results  Component Value Date   CREATININE 0.93 02/06/2015   BUN 18 02/06/2015   NA 133* 02/06/2015   K 4.2 02/06/2015   CL 96* 02/06/2015   CO2 30 02/06/2015    Lab Results  Component Value Date   ALT 80* 01/30/2015   AST 122* 01/30/2015   ALKPHOS 149* 01/30/2015   BILITOT 0.8 01/30/2015     Microbiology: Recent Results (from the past 240 hour(s))  Culture, blood (routine x 2)     Status: None   Collection Time: 01/30/15  4:58 PM  Result Value Ref Range Status    Specimen Description BLOOD RIGHT ANTECUBITAL  Final   Special Requests BOTTLES DRAWN AEROBIC ONLY 6CC  Final   Culture NO GROWTH 5 DAYS  Final   Report Status 02/04/2015 FINAL  Final  Culture, blood (routine x 2)     Status: None   Collection Time: 01/30/15  5:10 PM  Result Value Ref Range Status   Specimen Description BLOOD RIGHT ANTECUBITAL  Final   Special Requests BOTTLES DRAWN AEROBIC ONLY 3CC  Final   Culture NO GROWTH 5 DAYS  Final   Report Status 02/04/2015 FINAL  Final  Culture, Urine     Status: None   Collection Time: 02/05/15 12:15 PM  Result Value Ref Range Status   Specimen Description URINE, RANDOM  Final   Special Requests NONE  Final   Culture 2,000 COLONIES/mL INSIGNIFICANT GROWTH  Final   Report Status 02/06/2015 FINAL  Final    Assessment: Andre Tran remains afebrile however the source of his previous fever remains unknown. His bone marrow biopsy did not show recurrence of his leukemia. There was note of an ill-defined 2cm mass on the lateral portion of the left kidney on abdominal CT performed 7/26 which was new compared to 7/20, ? Clot/infarction.  No signs of infection. . There was no evidence of clot or vegetation on TEE performed 7/23 and he is currently anticoagulated with warfarin. Negative urine culture.   Plan: 1. Continue to hold antibiotics, appears non-infectious and hopefully remains resolved.    Will sign off, please call with questions     Scharlene Gloss, [facility] 02/06/2015, 11:19 AM

## 2015-02-06 NOTE — Progress Notes (Signed)
Patient ID: Andre Tran, male   DOB: 1961-01-04, 54 y.o.   MRN: [id number] Andre Tran reports left hip pain after a recent fall.  On my exam, he moves both hips very easily on his own and has excellent strength.  No significant pain on my exam at all.  His xrays and CT are unremarkable in regards to his hips.  No recs from ortho standpoint.

## 2015-02-06 NOTE — Progress Notes (Signed)
Admitted 01/28/15 due to falls and failure to thrive.  HeartMate II LVAD implated on 10/13/14 by Dr. Cyndia Bent.   Vital signs: Temp:  97.5 - 98.2 (no further fevers) HR:  65 - 78  Doppler MAP: 88 - 108 O2 Sat:  97 % RA Weight in lbs: 134 > 138 > 140 > 138 > 135 > 139 > 136 > 138 > 136  Telemetry:  SR with 1st degree AV block  LVAD interrogation reveals:  Speed:  8400 (dropped 02/02/15 d/t PI events) Flow:  4.2 Power: 4.2 PI: 6.7 Alarms:  none Events:  7 PI events today, 6 PI events yesterday, > 20 on Tuesday Fixed speed:  8400 Low speed limit: 8000 (< 8000 will cause regurgitant flow)  Drive Line: Dressing C/D/I and WNL.  Next dressing to be changed 02/09/15  Labs:  LDH trend: 446 > 322 > 302 > 341 > 344 > 359 > 392 > 298 > 304 > 270  INR trend:  2.48 > 2.92 > 3.48 > 2.41 > 2.19 > 1.98 > 1.42 > 1.43 > 1.45 > 1.48   Plan/Recommendations as discussed with team: 1. Complaining of a lot of left hip/side pain today. Is not walking with rehab currently. Ortho referral from VAD team.    2. LDH trending back down however with recent thrombotic and hemorrhagic events decision to change from coumadin to apixaban.   3. Bone marrow biopsy negative for leukemia or other hematologic cause.   4. Appetite seems better today. Ate all of his eggs and fruit for breakfast this morning. Changed from Ensure to Glucerna d/t uncontrolled CBGs. Hopefully will improve off steroids and Ensure. Was previously on Glipizide at home but was previously d/c'd d/t hypoglycemic events. Novolog has been added for meal coverage inpatient.    5. Requesting to go to [facility] today with his wife. D/W Dr. Haroldine Laws and agreed he was able to go with the company of a nurse.

## 2015-02-06 NOTE — Care Management Note (Addendum)
Case Management Note  Patient Details  Name: ASHLEE BEWLEY MRN: [id number] Date of Birth: [birth year]  Subjective/Objective:       Adm w fever             Action/Plan: lives w wife, pcp dr bensimhon   Expected Discharge Date:                  Expected Discharge Plan:  [place]  In-House Referral:     Discharge planning Services  CM Consult, Medication Assistance  Post Acute Care Choice:    Choice offered to:     DME Arranged:    DME Agency:     HH Arranged:    [place] Agency:     Status of Service:     Medicare Important Message Given:  Yes-third notification given Date Medicare IM Given:    Medicare IM give by:    Date Additional Medicare IM Given:    Additional Medicare Important Message give by:     If discussed at [place] of Stay Meetings, dates discussed:    Additional Comments: gave pt 30day free eliquis card. Pt will have 24.00 per month copay w no prior auth for eliquis.  Lacretia Leigh, RN 02/06/2015, 11:05 AM

## 2015-02-06 NOTE — Progress Notes (Signed)
Inpatient Diabetes Program Recommendations  AACE/ADA: New Consensus Statement on Inpatient Glycemic Control (2013)  Target Ranges:  Prepandial:   less than 140 mg/dL      Peak postprandial:   less than 180 mg/dL (1-2 hours)      Critically ill patients:  140 - 180 mg/dL   Results for AKUL, LEGGETTE (MRN [id number]) as of 02/06/2015 09:37  Ref. Range 02/05/2015 08:16 02/05/2015 11:14 02/05/2015 17:04 02/05/2015 21:13 02/06/2015 07:32  Glucose-Capillary Latest Ref Range: 65-99 mg/dL 182 (H) 270 (H) 322 (H) 280 (H) 186 (H)   Diabetes history: DM 2 Outpatient Diabetes medications: Lantus 5 units QHS Current orders for Inpatient glycemic control: Lantus 5 units QHS, Novolog Sensitive + HS + Novolog 3 units meal coverage  Inpatient Diabetes Program Recommendations  Insulin - Meal Coverage: Glucose increased into the 200-300 range with meals. Please consider increasing meal coverage to Novolog 5 units TID. Diet: Please change diet to carb modified.  Thanks,  Tama Headings RN, MSN, [facility] Inpatient Diabetes Coordinator Team Pager [phone]

## 2015-02-06 NOTE — Progress Notes (Addendum)
HeartMate 2 Rounding Note  Subjective:    Admitted after fall and failure to thrive and paroxysmal fevers. Recent 30 pound weight loss and anorexia. ESR 108. CT C/A/P no evidence of recurrent lymphoma or abscess. LDH 304>270    TEE 7/23 no vegetations 7/25 VAD speed turned down to 8400  7/26 CT Abd Chest- Concern for mass L kidney.  7/27- Bone marrow aspirate.no evidence of clear-cut leukemia, lymphoma or MDS at this time 7/28 - Urology Consulted-- recommendations for biopsy. Urine culture sent  7/29 ESR 108 -> 65    Afebrile over night.  Complaining of headache and L hip pain   LVAD INTERROGATION:  HeartMate II LVAD:  Flow 4.3   liters/min, speed 8400, power 4.3, PI 6.7   ~10 PI events   Objective:    Vital Signs:   Temp:  [97.4 F (36.3 C)-98.9 F (37.2 C)] 97.5 F (36.4 C) (07/29 0409) Resp:  [17-24] 24 (07/28 1700) SpO2:  [99 %-100 %] 99 % (07/29 0400) Weight:  [137 lb 8 oz (62.37 kg)] 137 lb 8 oz (62.37 kg) (07/29 0227) Last BM Date: 01/27/15 Mean arterial Pressure 90s   Intake/Output:   Intake/Output Summary (Last 24 hours) at 02/06/15 0759 Last data filed at 02/06/15 0411  Gross per 24 hour  Intake    360 ml  Output   1200 ml  Net   -840 ml     Physical Exam: General: In bed . Fatigued appearing.  HEENT: normal Neck: supple. JVP 5-6  Carotids 2+ bilat; no bruits. No lymphadenopathy or thryomegaly appreciated. Cor: Mechanical heart sounds with LVAD hum present. Lungs: clear Abdomen: soft, nontender, nondistended. No hepatosplenomegaly. No bruits or masses. Good bowel sounds. Driveline: C/D/I; securement device intact and driveline incorporated Extremities: no cyanosis, clubbing, rash, edema  Neuro: alert & orientedx3, cranial nerves grossly intact. moves all 4 extremities w/o difficulty. Affect flat  Telemetry: SR 80s   Labs: Basic Metabolic Panel:  Recent Labs Lab 02/02/15 0235 02/03/15 0230 02/04/15 0301 02/05/15 0220 02/06/15 0316  NA 131* 130*  127* 134* 133*  K 4.0 4.7 4.8 4.5 4.2  CL 97* 97* 94* 97* 96*  CO2 $Re'26 25 26 28 30  'cCg$ GLUCOSE 249* 172* 207* 241* 175*  BUN $Re'15 13 10 18 18  'obr$ CREATININE 0.91 0.81 0.77 0.85 0.93  CALCIUM 8.7* 8.6* 8.9 9.5 9.5    Liver Function Tests:  Recent Labs Lab 01/30/15 0944  AST 122*  ALT 80*  ALKPHOS 149*  BILITOT 0.8  PROT 7.9  ALBUMIN 3.5   No results for input(s): LIPASE, AMYLASE in the last 168 hours. No results for input(s): AMMONIA in the last 168 hours.  CBC:  Recent Labs Lab 02/01/15 0258 02/04/15 0301  WBC 9.7 9.2  HGB 9.8* 9.5*  HCT 29.2* 28.1*  MCV 84.4 84.1  PLT 274 329    INR:  Recent Labs Lab 02/02/15 0235 02/03/15 0230 02/04/15 0301 02/05/15 0220 02/06/15 0316  INR 1.98* 1.42 1.43 1.45 1.48    Other results:    Imaging: Ct Biopsy  02/04/2015   INDICATION: History of lymphoma and anemia. Please perform CT-guided biopsy for tissue diagnostic purposes  EXAM: CT GUIDED BONE MARROW BIOPSY AND ASPIRATION  MEDICATIONS: Fentanyl 25 mcg IV; Versed 0.5 mg IV  ANESTHESIA/SEDATION: Sedation Time  10 minutes  CONTRAST:  None  COMPLICATIONS: None immediate.  PROCEDURE: Informed consent was obtained from the patient following an explanation of the procedure, risks, benefits and alternatives. The patient understands, agrees and  consents for the procedure. All questions were addressed. A time out was performed prior to the initiation of the procedure. The patient was positioned right lateral decubitus on the CT gantry given the presence of his LVAD device. Non-contrast localization CT was performed of the pelvis to demonstrate the iliac marrow spaces. The operative site was prepped and draped in the usual sterile fashion.  Under sterile conditions and local anesthesia, a 22 gauge spinal needle was utilized for procedural planning. Next, an 11 gauge coaxial bone biopsy needle was advanced into the right iliac marrow space. Needle position was confirmed with CT imaging.  Initially, bone marrow aspiration was performed. Next, a bone marrow biopsy was obtained with the 11 gauge outer bone marrow device. Samples were prepared with the cytotechnologist and deemed adequate. The needle was removed intact. Hemostasis was obtained with compression and a dressing was placed. The patient tolerated the procedure well without immediate post procedural complication.  IMPRESSION: Successful CT guided right iliac bone marrow aspiration and core biopsies.   Electronically Signed   By: Sandi Mariscal M.D.   On: 02/04/2015 12:24     Medications:     Scheduled Medications: . amiodarone  200 mg Oral Daily  . amLODipine  2.5 mg Oral Daily  . atorvastatin  20 mg Oral QHS  . feeding supplement (ENSURE ENLIVE)  237 mL Oral TID BM  . insulin aspart  0-5 Units Subcutaneous QHS  . insulin aspart  0-9 Units Subcutaneous TID WC  . insulin aspart  3 Units Subcutaneous TID WC  . insulin glargine  5 Units Subcutaneous QHS  . magnesium oxide  400 mg Oral Daily  . methocarbamol  500 mg Oral 4 times per day  . mirtazapine  7.5 mg Oral QHS  . pantoprazole  40 mg Oral Daily  . predniSONE  10 mg Oral QAC breakfast  . traMADol  50 mg Oral 4 times per day  . Warfarin - Pharmacist Dosing Inpatient   Does not apply q1800    Infusions:    PRN Medications: acetaminophen, albuterol, bisacodyl, midazolam   Assessment:   1. Fever and failure to thrive 2. Chronic systolic HF - Status post LVAD implantation for DT.  - 7/22 Speed turned down to 8600.  - 7/24 speed turned down to 8400 3. H/o Non Hodgkins Lymphoma s/p therapy- CT chest/abd/pelvis ok.  4.  H/O CVA 2008  5. H/O VT/VF --St Jude ICD 2010 - Interrogated device. No arrhythmias noted.   6. DMII- not on any meds. Uncontrolled DM.  7. 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 8. Recent ICH 5/16.  -stable no new neuro deficits. Head CT - no recurrence 9. Hyponatremia Na 127  10. Severe malnutrition in context of chronic  illness 11. R heel pain--Xray ok .  12. Headache 13. L hip pain-   Afebrile over night. Heme/Onc following.   CT neg for ICH. CT abd/pelvis concern for mass L kidney. Renal ultrasound - ? Renal infarct versus pyelonephritis. Urine culture pending. Check UA. Discussed with urology with recommendations for biopsy.    Per Oncology Bone  Marrow biopsy-->no evidence of clear-cut leukemia, lymphoma or MDS at this times  SR 108>65. [place] remain negative. Day 4 steroids. 7/23 TEE without vegetations.  EF 40-45%   Uric Acid 2.1 R heel Xray normal.   L hip pain- Refer to ortho    Continue speed at 8400. ~ 10 PI events.   INR 1.48. On coumadin. Pharmacy dosing.   CBGS uncontrolled-  Steroids contributing. Add 4 units Novolog tid.   I reviewed the LVAD parameters from today, and compared the results to the patient's prior recorded data.  No programming changes were made.  The LVAD is functioning within specified parameters.  The patient performs LVAD self-test daily.  LVAD interrogation was negative for any significant power changes, alarms or PI events/speed drops.  LVAD equipment check completed and is in good working order.  Back-up equipment present.   LVAD education done on emergency procedures and precautions and reviewed exit site care.  Length of Stay: 9  CLEGG,AMY NP-C  02/06/2015, 7:59 AM  VAD Team --- VAD ISSUES ONLY--- Pager [phone] (7am - 7am)  Advanced Heart Failure Team  Pager [phone] (M-F; 7a - 4p)  Please contact [facility] for night-coverage after hours (4p -7a ) and weekends on [email or web address]  Patient seen and examined with Darrick Grinder, NP. We discussed all aspects of the encounter. I agree with the assessment and plan as stated above.   Essentially unchanged. Still with hip pain and headache. I reviewed CTs with radiology and urology. 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 Lesion in upper left pole of left kidney is exophytic and may be malignant but  is very small 8mm and f/u imaging in 3-6 months has been recommended over biopsy.   I suspect the renal infarct is the cause of his fever and elevated ESR so will stop steroids (got prednisone x 4 days for possible PMR without benefit). However I doubt the infarct has caused his 30 pound weight loss. Investigation has been exhaustive and has not revealed any clear evidence of significant infection or malignancy (BCx, bone marrow bx, TEE, CT C/A/P, UA). As weight loss started after intracranial hemoorrhage, i wonder if he had damage to his appetite center or other related defect.  I have discussed extensively with Dr. Aundra Dubin and Zada Girt, RN. Given evidence of intracranial bleed and subsequent embolic infarct will switch anticoagulation to apixaban. I discussed the pros/cons of this with Mr Calabretta and he understands that these agents have not been studied in patients with LVADs but that we have had success in 2 patients with somewhat similar issues of concomitant bleeding and thrombosis.   Plan: 1) Stop steroids 2) Switch coumadin to apixaban 3) Dr. Ninfa Linden from ortho to see for hip pain 4) Continue PT and Ensure 5) Is there any role for RHC to assess for low output due to RV failure?    Bensimhon, Daniel,MD 9:06 AM

## 2015-02-07 LAB — LACTATE DEHYDROGENASE: LDH: 289 U/L — AB (ref 98–192)

## 2015-02-07 LAB — BASIC METABOLIC PANEL
Anion gap: 9 (ref 5–15)
BUN: 17 mg/dL (ref 6–20)
CHLORIDE: 95 mmol/L — AB (ref 101–111)
CO2: 27 mmol/L (ref 22–32)
CREATININE: 0.85 mg/dL (ref 0.61–1.24)
Calcium: 9.4 mg/dL (ref 8.9–10.3)
GFR calc Af Amer: >60 mL/min (ref >60)
GFR calc non Af Amer: >60 mL/min (ref >60)
GLUCOSE: 158 mg/dL — AB (ref 65–99)
Potassium: 4 mmol/L (ref 3.5–5.1)
Sodium: 131 mmol/L — ABNORMAL LOW (ref 135–145)

## 2015-02-07 LAB — GLUCOSE, CAPILLARY
GLUCOSE-CAPILLARY: 153 mg/dL — AB (ref 65–99)
GLUCOSE-CAPILLARY: 137 mg/dL — AB (ref 65–99)

## 2015-02-07 LAB — PROTIME-INR
INR: 1.71 — AB (ref 0.00–1.49)
PROTHROMBIN TIME: 20 s — AB (ref 11.6–15.2)

## 2015-02-07 NOTE — Progress Notes (Signed)
Pt found on floor sitting up against the side of his bed by Charge nurse.  Pt alert and oriented x4 prior to and immediately following fall.  Pt denied pain prior to and immediately following fall.  Pt stated, "I was trying to reach my jug (urinal) and I slid down to the floor."  Pt verbalized understanding and had demonstrated the correct use of call bell. When asked why he did not use his call bell for assistance pt stated, "I'm just stupid."  Pt assisted back to bed by assigned RN and Camera operator.  Dr Marlowe Sax on unit and notified of pt fall on 02/07/15 at 2324.  Vital signs: Temp 98.9, HR 92, MAP 88, O2 Sat 98% on RA, RR 16.  No additional interventions needed.  Bed alarm on. Will continue to monitor.

## 2015-02-07 NOTE — Progress Notes (Signed)
Patient ID: Andre Tran, male   DOB: 06-Apr-1961, 54 y.o.   MRN: [id number] HeartMate 2 Rounding Note  Subjective:    Admitted after fall and failure to thrive and paroxysmal fevers. Recent 30 pound weight loss and anorexia. ESR 108. CT C/A/P no evidence of recurrent lymphoma or abscess. LDH 304>270    TEE 7/23 no vegetations 7/25 VAD speed turned down to 8400  7/26 CT Abd Chest- Concern for mass L kidney.  7/27- Bone marrow aspirate.no evidence of clear-cut leukemia, lymphoma or MDS at this time 7/28 - Urology Consulted-- recommendations for biopsy. Urine culture sent  7/29 ESR 108 -> 65, Eliquis started.    Afebrile over night.  No headache or hip pain today.   Seen by ID yesterday => signed off, do not think he has an infection. Seen by ortho yesterday => no hip problem, nothing to do.   LDH 289  LVAD INTERROGATION:  HeartMate II LVAD:  Flow 4.4   liters/min, speed 8400, power 4.5, PI 6.7   ~10-15 PI events   Objective:    Vital Signs:   Temp:  [98.2 F (36.8 C)-98.5 F (36.9 C)] 98.4 F (36.9 C) (07/30 0425) Pulse Rate:  [68] 68 (07/29 1632) Resp:  [17-24] 24 (07/30 0400) SpO2:  [98 %-100 %] 100 % (07/30 0425) Weight:  [138 lb 7.2 oz (62.8 kg)] 138 lb 7.2 oz (62.8 kg) (07/30 0425) Last BM Date: 01/27/15 Mean arterial Pressure 90s   Intake/Output:   Intake/Output Summary (Last 24 hours) at 02/07/15 0829 Last data filed at 02/07/15 0431  Gross per 24 hour  Intake    200 ml  Output    750 ml  Net   -550 ml     Physical Exam: General: In bed . Fatigued appearing.  HEENT: normal Neck: supple. JVP 5-6  Carotids 2+ bilat; no bruits. No lymphadenopathy or thryomegaly appreciated. Cor: Mechanical heart sounds with LVAD hum present. Lungs: clear Abdomen: soft, nontender, nondistended. No hepatosplenomegaly. No bruits or masses. Good bowel sounds. Driveline: C/D/I; securement device intact and driveline incorporated Extremities: no cyanosis, clubbing, rash, edema   Neuro: alert & orientedx3, cranial nerves grossly intact. moves all 4 extremities w/o difficulty. Affect flat  Telemetry: SR 80s   Labs: Basic Metabolic Panel:  Recent Labs Lab 02/03/15 0230 02/04/15 0301 02/05/15 0220 02/06/15 0316 02/07/15 0232  NA 130* 127* 134* 133* 131*  K 4.7 4.8 4.5 4.2 4.0  CL 97* 94* 97* 96* 95*  CO2 $Re'25 26 28 30 27  'Dce$ GLUCOSE 172* 207* 241* 175* 158*  BUN $Re'13 10 18 18 17  'GAu$ CREATININE 0.81 0.77 0.85 0.93 0.85  CALCIUM 8.6* 8.9 9.5 9.5 9.4    Liver Function Tests: No results for input(s): AST, ALT, ALKPHOS, BILITOT, PROT, ALBUMIN in the last 168 hours. No results for input(s): LIPASE, AMYLASE in the last 168 hours. No results for input(s): AMMONIA in the last 168 hours.  CBC:  Recent Labs Lab 02/01/15 0258 02/04/15 0301  WBC 9.7 9.2  HGB 9.8* 9.5*  HCT 29.2* 28.1*  MCV 84.4 84.1  PLT 274 329    INR:  Recent Labs Lab 02/03/15 0230 02/04/15 0301 02/05/15 0220 02/06/15 0316 02/07/15 0232  INR 1.42 1.43 1.45 1.48 1.71*    Other results:    Imaging: No results found.   Medications:     Scheduled Medications: . amiodarone  200 mg Oral Daily  . amLODipine  2.5 mg Oral Daily  . apixaban  5 mg Oral  BID  . atorvastatin  20 mg Oral QHS  . feeding supplement (GLUCERNA SHAKE)  237 mL Oral TID BM  . insulin aspart  0-5 Units Subcutaneous QHS  . insulin aspart  0-9 Units Subcutaneous TID WC  . insulin aspart  3 Units Subcutaneous TID WC  . insulin glargine  5 Units Subcutaneous QHS  . magnesium oxide  400 mg Oral Daily  . methocarbamol  500 mg Oral 4 times per day  . mirtazapine  7.5 mg Oral QHS  . pantoprazole  40 mg Oral Daily  . traMADol  50 mg Oral 4 times per day    Infusions:    PRN Medications: acetaminophen, albuterol, bisacodyl, fentaNYL (SUBLIMAZE) injection, midazolam   Assessment:   1. Fever and failure to thrive 2. Chronic systolic HF - Status post LVAD implantation for DT.  - 7/22 Speed turned down to  8600.  - 7/24 speed turned down to 8400 3. H/o Non Hodgkins Lymphoma s/p therapy- CT chest/abd/pelvis ok.  4.  H/O CVA 2008  5. H/O VT/VF --St Jude ICD 2010 - Interrogated device. No arrhythmias noted.   6. DMII- not on any meds. Uncontrolled DM.  7. 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 8. Recent ICH 5/16.  -stable no new neuro deficits. Head CT - no recurrence 9. Hyponatremia Na 127  10. Severe malnutrition in context of chronic illness 11. R heel pain--Xray ok .  12. Headache- Resolved.  13. L hip pain- Ortho evaluated, do not think there is a problem.  Resolved.   Per Oncology Bone  Marrow biopsy-->no evidence of clear-cut leukemia, lymphoma or MDS at this times  ESR 108>65. Blood cultures remain negative. Off steroids. 7/23 TEE without vegetations, EF 40-45%.    Uric Acid 2.1, right heel X-ray normal.   Continue speed at 8400. ~ 10-15 PI events, stable.   Some improvement, headache/hip pain better.  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 Lesion in upper left pole of left kidney is exophytic and may be malignant but is very small 9 mm and f/u imaging in 3-6 months has been recommended over biopsy. I suspect the renal infarct is the cause of his fever and elevated ESR.  We have stopped steroids (got prednisone x 4 days for possible PMR without benefit). We doubt the infarct has caused his 30 pound weight loss. Investigation has been exhaustive and has not revealed any clear evidence of significant infection or malignancy (BCx, bone marrow bx, TEE, CT C/A/P, UA). As weight loss started after intracranial hemoorrhage, possible he had damage to his appetite center or other related defect.  Given evidence of intracranial bleed on coumadin with INR goal 2-3 and subsequent embolic infarct on coumadin with lower INR goal, we switched anticoagulation to apixaban.  We have discussed pros/cons of this with Andre Tran and he understands that these agents have not  been studied in patients with LVADs but that we have had success in 2 patients with somewhat similar issues of concomitant bleeding and thrombosis.   I will plan to monitor him in the hospital until probably Monday on apixaban.  Needs to ambulate today.   I reviewed the LVAD parameters from today, and compared the results to the patient's prior recorded data.  No programming changes were made.  The LVAD is functioning within specified parameters.  The patient performs LVAD self-test daily.  LVAD interrogation was negative for any significant power changes, alarms or PI events/speed drops.  LVAD equipment check completed and is in good working order.  Back-up equipment present.   LVAD education done on emergency procedures and precautions and reviewed exit site care.  Length of Stay: [place] 02/07/2015, 8:29 AM  VAD Team --- VAD ISSUES ONLY--- Pager [phone] (7am - 7am)  Advanced Heart Failure Team  Pager [phone] (M-F; 7a - 4p)  Please contact [facility] for night-coverage after hours (4p -7a ) and weekends on [email or web address]

## 2015-02-07 NOTE — Progress Notes (Signed)
CARDIAC REHAB PHASE I   PRE:  Rate/Rhythm: 90 SR  BP:  Supine:   Sitting: 90  Standing:    SaO2:   MODE:  Ambulation: 50 ft   POST:  Rate/Rhythm: 129 ST  BP:  Supine:   Sitting: 110  Standing:    SaO2:  Patient has already walked with his nurse earlier today.  With [place] he was tired and walked 50 ft.  Encouraged to walk 3 times/day total over the weekend.  C/o back pain.  Assistance x2 with a rolling walker. 1330-1400  Liliane Channel RN, BSN 02/07/2015 2:01 PM

## 2015-02-08 LAB — COMPREHENSIVE METABOLIC PANEL
ALBUMIN: 2.9 g/dL — AB (ref 3.5–5.0)
ALT: 96 U/L — AB (ref 17–63)
AST: 70 U/L — AB (ref 15–41)
Alkaline Phosphatase: 143 U/L — ABNORMAL HIGH (ref 38–126)
Anion gap: 10 (ref 5–15)
BILIRUBIN TOTAL: 0.8 mg/dL (ref 0.3–1.2)
BUN: 18 mg/dL (ref 6–20)
CHLORIDE: 94 mmol/L — AB (ref 101–111)
CO2: 26 mmol/L (ref 22–32)
Calcium: 9 mg/dL (ref 8.9–10.3)
Creatinine, Ser: 1.03 mg/dL (ref 0.61–1.24)
Glucose, Bld: 186 mg/dL — ABNORMAL HIGH (ref 65–99)
Potassium: 4.6 mmol/L (ref 3.5–5.1)
SODIUM: 130 mmol/L — AB (ref 135–145)
Total Protein: 7.1 g/dL (ref 6.5–8.1)

## 2015-02-08 LAB — CBC
HCT: 30.3 % — ABNORMAL LOW (ref 39.0–52.0)
HEMOGLOBIN: 10 g/dL — AB (ref 13.0–17.0)
MCH: 28.2 pg (ref 26.0–34.0)
MCHC: 33 g/dL (ref 30.0–36.0)
MCV: 85.4 fL (ref 78.0–100.0)
Platelets: 378 10*3/uL (ref 150–400)
RBC: 3.55 MIL/uL — AB (ref 4.22–5.81)
RDW: 14.8 % (ref 11.5–15.5)
WBC: 12.7 10*3/uL — ABNORMAL HIGH (ref 4.0–10.5)

## 2015-02-08 LAB — GLUCOSE, CAPILLARY
GLUCOSE-CAPILLARY: 141 mg/dL — AB (ref 65–99)
GLUCOSE-CAPILLARY: 137 mg/dL — AB (ref 65–99)
Glucose-Capillary: 196 mg/dL — ABNORMAL HIGH (ref 65–99)
Glucose-Capillary: 237 mg/dL — ABNORMAL HIGH (ref 65–99)
Glucose-Capillary: 154 mg/dL — ABNORMAL HIGH (ref 65–99)

## 2015-02-08 LAB — LACTATE DEHYDROGENASE: LDH: 298 U/L — ABNORMAL HIGH (ref 98–192)

## 2015-02-08 NOTE — Progress Notes (Signed)
Pt doesn't feel well today,low grade fever. Has refused to get oob and walk today,just "feels too weak".    Andre Tran

## 2015-02-08 NOTE — Progress Notes (Signed)
Patient ID: Andre Tran, male   DOB: [birth year], 53 y.o.   MRN: [id number] HeartMate 2 Rounding Note  Subjective:    Admitted after fall and failure to thrive and paroxysmal fevers. Recent 30 pound weight loss and anorexia. ESR 108. CT C/A/P no evidence of recurrent lymphoma or abscess. LDH 304>270    TEE 7/23 no vegetations 7/25 VAD speed turned down to 8400  7/26 CT Abd Chest- Concern for mass L kidney.  7/27- Bone marrow aspirate.no evidence of clear-cut leukemia, lymphoma or MDS at this time 7/28 - Urology Consulted-- recommendations for biopsy. Urine culture sent  7/29 ESR 108 -> 65, Eliquis started.    Afebrile.  No headache or hip pain for the last 2 days.  WBCs 12.7 today (has not had CBC in several days). Generalized fatigue but walked twice yesterday.   Seen by ID => signed off, do not think he has an infection. Seen by ortho => no hip problem, nothing to do.   LDH 289 => 298  LVAD INTERROGATION:  HeartMate II LVAD:  Flow 4.8 liters/min, speed 8400, power 4.5, PI 6.3   8 PI events/24 hrs   Objective:    Vital Signs:   Temp:  [97.3 F (36.3 C)-99 F (37.2 C)] 97.5 F (36.4 C) (07/31 0300) Pulse Rate:  [86-92] 86 (07/31 0300) Resp:  [18-26] 25 (07/31 0400) SpO2:  [96 %-100 %] 97 % (07/31 0300) Weight:  [148 lb 2.4 oz (67.2 kg)] 148 lb 2.4 oz (67.2 kg) (07/31 0400) Last BM Date: 01/27/15 Mean arterial Pressure 90s   Intake/Output:   Intake/Output Summary (Last 24 hours) at 02/08/15 0817 Last data filed at 02/08/15 0400  Gross per 24 hour  Intake    480 ml  Output    850 ml  Net   -370 ml     Physical Exam: General: In bed . Fatigued appearing.  HEENT: normal Neck: supple. JVP 5-6  Carotids 2+ bilat; no bruits. No lymphadenopathy or thryomegaly appreciated. Cor: Mechanical heart sounds with LVAD hum present. Lungs: clear Abdomen: soft, nontender, nondistended. No hepatosplenomegaly. No bruits or masses. Good bowel sounds. Driveline: C/D/I; securement device  intact and driveline incorporated Extremities: no cyanosis, clubbing, rash, edema  Neuro: alert & orientedx3, cranial nerves grossly intact. moves all 4 extremities w/o difficulty. Affect flat  Telemetry: SR 80s   Labs: Basic Metabolic Panel:  Recent Labs Lab 02/04/15 0301 02/05/15 0220 02/06/15 0316 02/07/15 0232 02/08/15 0222  NA 127* 134* 133* 131* 130*  K 4.8 4.5 4.2 4.0 4.6  CL 94* 97* 96* 95* 94*  CO2 $Re'26 28 30 27 26  'tpT$ GLUCOSE 207* 241* 175* 158* 186*  BUN $Re'10 18 18 17 18  'Kwy$ CREATININE 0.77 0.85 0.93 0.85 1.03  CALCIUM 8.9 9.5 9.5 9.4 9.0    Liver Function Tests:  Recent Labs Lab 02/08/15 0222  AST 70*  ALT 96*  ALKPHOS 143*  BILITOT 0.8  PROT 7.1  ALBUMIN 2.9*   No results for input(s): LIPASE, AMYLASE in the last 168 hours. No results for input(s): AMMONIA in the last 168 hours.  CBC:  Recent Labs Lab 02/04/15 0301 02/08/15 0222  WBC 9.2 12.7*  HGB 9.5* 10.0*  HCT 28.1* 30.3*  MCV 84.1 85.4  PLT 329 378    INR:  Recent Labs Lab 02/03/15 0230 02/04/15 0301 02/05/15 0220 02/06/15 0316 02/07/15 0232  INR 1.42 1.43 1.45 1.48 1.71*    Other results:    Imaging: No results found.  Medications:     Scheduled Medications: . amiodarone  200 mg Oral Daily  . amLODipine  2.5 mg Oral Daily  . apixaban  5 mg Oral BID  . atorvastatin  20 mg Oral QHS  . feeding supplement (GLUCERNA SHAKE)  237 mL Oral TID BM  . insulin aspart  0-5 Units Subcutaneous QHS  . insulin aspart  0-9 Units Subcutaneous TID WC  . insulin aspart  3 Units Subcutaneous TID WC  . insulin glargine  5 Units Subcutaneous QHS  . magnesium oxide  400 mg Oral Daily  . methocarbamol  500 mg Oral 4 times per day  . mirtazapine  7.5 mg Oral QHS  . pantoprazole  40 mg Oral Daily  . traMADol  50 mg Oral 4 times per day    Infusions:    PRN Medications: acetaminophen, albuterol, bisacodyl, fentaNYL (SUBLIMAZE) injection, midazolam   Assessment:   1. Fever and failure  to thrive 2. Chronic systolic HF - Status post LVAD implantation for DT.  - 7/22 Speed turned down to 8600.  - 7/24 speed turned down to 8400 3. H/o Non Hodgkins Lymphoma s/p therapy- CT chest/abd/pelvis ok.  4.  H/O CVA 2008  5. H/O VT/VF --St Jude ICD 2010 - Interrogated device. No arrhythmias noted.   6. DMII- not on any meds. Uncontrolled DM.  7. PSVT- Quiescent on amiodarone 200 mg daily. 8. Recent ICH 5/16.  -stable no new neuro deficits. Head CT - no recurrence 9. Hyponatremia Na 127  10. Severe malnutrition in context of chronic illness 11. R heel pain--Xray ok .  12. Headache- Resolved.  13. L hip pain- Ortho evaluated, do not think there is a problem.  Resolved.  14. LFT elevation- mild  Per Oncology Bone  Marrow biopsy-->no evidence of clear-cut leukemia, lymphoma or MDS at this times  ESR 108>65. Blood cultures remain negative. Off steroids. 7/23 TEE without vegetations, EF 40-45%.    Uric Acid 2.1, right heel X-ray normal.   Continue speed at 8400. 8 PI events/24 hrs, stable.   Some improvement, headache/hip pain better.  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 Lesion in upper left pole of left kidney is exophytic and may be malignant but is very small 9 mm and f/u imaging in 3-6 months has been recommended over biopsy. I suspect the renal infarct is the cause of his fever and elevated ESR.  We have stopped steroids (got prednisone x 4 days for possible PMR without benefit). We doubt the infarct has caused his 30 pound weight loss. Investigation has been exhaustive and has not revealed any clear evidence of significant infection or malignancy (BCx, bone marrow bx, TEE, CT C/A/P, UA). As weight loss started after intracranial hemoorrhage, possible he had damage to his appetite center or other related defect. WBCs elevated at 12.7 today but no CBC several days prior.  Repeat CBC and follow WBCs tomorrow.  Given evidence of intracranial bleed  on coumadin with INR goal 2-3 and subsequent embolic infarct on coumadin with lower INR goal, we switched anticoagulation to apixaban.  We have discussed pros/cons of this with Mr Poteete and he understands that these agents have not been studied in patients with LVADs but that we have had success in 2 patients with somewhat similar issues of concomitant bleeding and thrombosis.  LDH today is stable.   I will plan to monitor him in the hospital until probably Monday on apixaban.  Needs to ambulate today.  I reviewed the LVAD parameters from today, and compared the results to the patient's prior recorded data.  No programming changes were made.  The LVAD is functioning within specified parameters.  The patient performs LVAD self-test daily.  LVAD interrogation was negative for any significant power changes, alarms or PI events/speed drops.  LVAD equipment check completed and is in good working order.  Back-up equipment present.   LVAD education done on emergency procedures and precautions and reviewed exit site care.  Length of Stay: [place] 02/08/2015, 8:17 AM  VAD Team --- VAD ISSUES ONLY--- Pager [phone] (7am - 7am)  Advanced Heart Failure Team  Pager [phone] (M-F; 7a - 4p)  Please contact [facility] for night-coverage after hours (4p -7a ) and weekends on [email or web address]

## 2015-02-09 ENCOUNTER — Ambulatory Visit (HOSPITAL_COMMUNITY): Payer: Medicare Other

## 2015-02-09 LAB — COMPREHENSIVE METABOLIC PANEL
ALK PHOS: 138 U/L — AB (ref 38–126)
ALT: 92 U/L — ABNORMAL HIGH (ref 17–63)
AST: 80 U/L — AB (ref 15–41)
Albumin: 3.1 g/dL — ABNORMAL LOW (ref 3.5–5.0)
Anion gap: 8 (ref 5–15)
BUN: 18 mg/dL (ref 6–20)
CALCIUM: 9.1 mg/dL (ref 8.9–10.3)
CO2: 26 mmol/L (ref 22–32)
Chloride: 96 mmol/L — ABNORMAL LOW (ref 101–111)
Creatinine, Ser: 1.12 mg/dL (ref 0.61–1.24)
GFR calc Af Amer: >60 mL/min (ref >60)
GLUCOSE: 146 mg/dL — AB (ref 65–99)
POTASSIUM: 4.4 mmol/L (ref 3.5–5.1)
Sodium: 130 mmol/L — ABNORMAL LOW (ref 135–145)
Total Bilirubin: 1.1 mg/dL (ref 0.3–1.2)
Total Protein: 7.2 g/dL (ref 6.5–8.1)

## 2015-02-09 LAB — CBC
HEMATOCRIT: 30 % — AB (ref 39.0–52.0)
HEMOGLOBIN: 9.8 g/dL — AB (ref 13.0–17.0)
MCH: 28.4 pg (ref 26.0–34.0)
MCHC: 32.7 g/dL (ref 30.0–36.0)
MCV: 87 fL (ref 78.0–100.0)
Platelets: 347 10*3/uL (ref 150–400)
RBC: 3.45 MIL/uL — ABNORMAL LOW (ref 4.22–5.81)
RDW: 14.8 % (ref 11.5–15.5)
WBC: 15.3 10*3/uL — ABNORMAL HIGH (ref 4.0–10.5)

## 2015-02-09 LAB — GLUCOSE, CAPILLARY
GLUCOSE-CAPILLARY: 153 mg/dL — AB (ref 65–99)
GLUCOSE-CAPILLARY: 141 mg/dL — AB (ref 65–99)
GLUCOSE-CAPILLARY: 195 mg/dL — AB (ref 65–99)
Glucose-Capillary: 212 mg/dL — ABNORMAL HIGH (ref 65–99)

## 2015-02-09 LAB — EHRLICHIA ANTIBODY PANEL
E chaffeensis (HGE) Ab, IgG: NEGATIVE
E chaffeensis (HGE) Ab, IgM: NEGATIVE
E. Chaffeensis (HME) IgM Titer: NEGATIVE
E.Chaffeensis (HME) IgG: NEGATIVE

## 2015-02-09 LAB — LACTATE DEHYDROGENASE: LDH: 337 U/L — AB (ref 98–192)

## 2015-02-09 NOTE — Progress Notes (Signed)
OT Cancellation Note  Patient Details Name: Andre Tran MRN: [id number] DOB: Aug 03, 1960   Cancelled Treatment:    Reason Eval/Treat Not Completed: Patient declined, no reason specified. Will continue to follow.  [name] 02/09/2015, 4:02 PM  [phone]

## 2015-02-09 NOTE — Progress Notes (Signed)
PT Cancellation Note  Patient Details Name: Andre Tran MRN: [id number] DOB: 07/27/1960   Cancelled Treatment:    Reason Eval/Treat Not Completed: Patient declined, no reason specified (pt declined mobility at this time stating he is tired and just returned to bed)   Lanetta Inch Beth 02/09/2015, 1:32 PM Elwyn Reach, [place]

## 2015-02-09 NOTE — Progress Notes (Signed)
Patient ID: Andre Tran, male   DOB: 1961/01/19, 54 y.o.   MRN: [id number] HeartMate 2 Rounding Note  Subjective:    Admitted after fall and failure to thrive and paroxysmal fevers. Recent 30 pound weight loss and anorexia. ESR 108. CT C/A/P no evidence of recurrent lymphoma or abscess. LDH 304>270    TEE 7/23 no vegetations 7/25 VAD speed turned down to 8400  7/26 CT Abd Chest- Concern for mass L kidney.  7/27- Bone marrow aspirate.no evidence of clear-cut leukemia, lymphoma or MDS at this time 7/28 - Urology Consulted-- recommendations for biopsy. Urine culture sent  7/29 ESR 108 -> 65, Eliquis started.  8/1 WBC 15    Low grade temp yesterday. Denies headache/hip pain. WBC and LDH trending up.    Seen by ID => signed off, do not think he has an infection. Seen by ortho => no hip problem, nothing to do.   LDH 289 => 298=>337  LVAD INTERROGATION:  HeartMate II LVAD:  Flow 4.4  liters/min, speed 8400, power 5, PI 6.4   ~8  PI events/24 hrs   Objective:    Vital Signs:   Temp:  [99 F (37.2 C)-100.6 F (38.1 C)] 99 F (37.2 C) (08/01 0300) Pulse Rate:  [87-94] 92 (08/01 0300) Resp:  [19-23] 23 (08/01 0300) SpO2:  [97 %-99 %] 98 % (08/01 0300) Weight:  [136 lb 14.5 oz (62.1 kg)] 136 lb 14.5 oz (62.1 kg) (08/01 0300) Last BM Date: 01/27/15 Mean arterial Pressure 80s   Intake/Output:   Intake/Output Summary (Last 24 hours) at 02/09/15 0732 Last data filed at 02/08/15 2344  Gross per 24 hour  Intake    950 ml  Output    400 ml  Net    550 ml     Physical Exam: General: In bed . Fatigued appearing.  HEENT: normal Neck: supple. JVP 5-6  Carotids 2+ bilat; no bruits. No lymphadenopathy or thryomegaly appreciated. Cor: Mechanical heart sounds with LVAD hum present. Lungs: clear Abdomen: soft, nontender, nondistended. No hepatosplenomegaly. No bruits or masses. Good bowel sounds. Driveline: C/D/I; securement device intact and driveline incorporated Extremities: no  cyanosis, clubbing, rash, edema  Neuro: alert & orientedx3, cranial nerves grossly intact. moves all 4 extremities w/o difficulty. Affect flat  Telemetry: SR 80s   Labs: Basic Metabolic Panel:  Recent Labs Lab 02/05/15 0220 02/06/15 0316 02/07/15 0232 02/08/15 0222 02/09/15 0225  NA 134* 133* 131* 130* 130*  K 4.5 4.2 4.0 4.6 4.4  CL 97* 96* 95* 94* 96*  CO2 $Re'28 30 27 26 26  'GGt$ GLUCOSE 241* 175* 158* 186* 146*  BUN $Re'18 18 17 18 18  'mTI$ CREATININE 0.85 0.93 0.85 1.03 1.12  CALCIUM 9.5 9.5 9.4 9.0 9.1    Liver Function Tests:  Recent Labs Lab 02/08/15 0222 02/09/15 0225  AST 70* 80*  ALT 96* 92*  ALKPHOS 143* 138*  BILITOT 0.8 1.1  PROT 7.1 7.2  ALBUMIN 2.9* 3.1*   No results for input(s): LIPASE, AMYLASE in the last 168 hours. No results for input(s): AMMONIA in the last 168 hours.  CBC:  Recent Labs Lab 02/04/15 0301 02/08/15 0222 02/09/15 0225  WBC 9.2 12.7* 15.3*  HGB 9.5* 10.0* 9.8*  HCT 28.1* 30.3* 30.0*  MCV 84.1 85.4 87.0  PLT 329 378 347    INR:  Recent Labs Lab 02/03/15 0230 02/04/15 0301 02/05/15 0220 02/06/15 0316 02/07/15 0232  INR 1.42 1.43 1.45 1.48 1.71*    Other results:  Imaging: No results found.   Medications:     Scheduled Medications: . amiodarone  200 mg Oral Daily  . amLODipine  2.5 mg Oral Daily  . apixaban  5 mg Oral BID  . atorvastatin  20 mg Oral QHS  . feeding supplement (GLUCERNA SHAKE)  237 mL Oral TID BM  . insulin aspart  0-5 Units Subcutaneous QHS  . insulin aspart  0-9 Units Subcutaneous TID WC  . insulin aspart  3 Units Subcutaneous TID WC  . insulin glargine  5 Units Subcutaneous QHS  . magnesium oxide  400 mg Oral Daily  . methocarbamol  500 mg Oral 4 times per day  . mirtazapine  7.5 mg Oral QHS  . pantoprazole  40 mg Oral Daily  . traMADol  50 mg Oral 4 times per day    Infusions:    PRN Medications: acetaminophen, albuterol, bisacodyl, fentaNYL (SUBLIMAZE) injection,  midazolam   Assessment:   1. Fever and failure to thrive 2. Chronic systolic HF - Status post LVAD implantation for DT.  - 7/22 Speed turned down to 8600.  - 7/24 speed turned down to 8400 3. H/o Non Hodgkins Lymphoma s/p therapy- CT chest/abd/pelvis ok.  4.  H/O CVA 2008  5. H/O VT/VF --St Jude ICD 2010 - Interrogated device. No arrhythmias noted.   6. DMII- not on any meds. Uncontrolled DM.  7. PSVT- Quiescent on amiodarone 200 mg daily. 8. Recent ICH 5/16.  -stable no new neuro deficits. Head CT - no recurrence 9. Hyponatremia Na 127  10. Severe malnutrition in context of chronic illness 11. R heel pain--Xray ok .  12. Headache- Resolved.  13. L hip pain- Ortho evaluated, do not think there is a problem.  Resolved.  14. LFT elevation- mild  Per Oncology Bone  Marrow biopsy-->no evidence of clear-cut leukemia, lymphoma or MDS at this times  ESR 108>65. Blood cultures remain negative. Off steroids. 7/23 TEE without vegetations, EF 40-45%.    Continue speed at 8400. 8 PI events/24 hrs, stable.   Some improvement, headache/hip pain better.  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 Lesion in upper left pole of left kidney is exophytic and may be malignant but is very small 9 mm and f/u imaging in 3-6 months has been recommended over biopsy.Renal infarct is the cause of his fever and elevated ESR.  We have stopped steroids (got prednisone x 4 days for possible PMR without benefit). We doubt the infarct has caused his 30 pound weight loss. Investigation has been exhaustive and has not revealed any clear evidence of significant infection or malignancy (BCx, bone marrow bx, TEE, CT C/A/P, UA). As weight loss started after intracranial hemoorrhage, possible he had damage to his appetite center or other related defect. WBCs elevated at 12.7>15.3 .    TMAX 100.6 WBC trending up. LDH trending up. 867>619   Given evidence of intracranial bleed on coumadin with  INR goal 2-3 and subsequent embolic infarct on coumadin with lower INR goal, we switched anticoagulation to apixaban.  We have discussed pros/cons of this with Mr Blethen and he understands that these agents have not been studied in patients with LVADs but that we have had success in 2 patients with somewhat similar issues of concomitant bleeding and thrombosis.  LDH today is stable.    I reviewed the LVAD parameters from today, and compared the results to the patient's prior recorded data.  No programming changes were made.  The  LVAD is functioning within specified parameters.  The patient performs LVAD self-test daily.  LVAD interrogation was negative for any significant power changes, alarms or PI events/speed drops.  LVAD equipment check completed and is in good working order.  Back-up equipment present.   LVAD education done on emergency procedures and precautions and reviewed exit site care.  Length of Stay: 12  CLEGG,AMY 02/09/2015, 7:32 AM  VAD Team --- VAD ISSUES ONLY--- Pager [phone] (7am - 7am)  Advanced Heart Failure Team  Pager [phone] (M-F; 7a - 4p)  Please contact [facility] for night-coverage after hours (4p -7a ) and weekends on [email or web address]   Patient seen and examined with Darrick Grinder, NP. We discussed all aspects of the encounter. I agree with the assessment and plan as stated above.   Now with recurrent low-grade fever and rising white count. LDH up slightly. No focal source for fever despite exhaustive w/u. ID has seen and signed off. Agree with repeat Bcx. No abx at this point.   Continue apixaban - watch LDH closely. Continue PT.   Hopefully we can get him home soon.   VAD parameters reviewed personally. PI events much less frequent.   Bensimhon, Daniel,MD 6:56 PM

## 2015-02-09 NOTE — Care Management Important Message (Signed)
Important Message  Patient Details  Name: Andre Tran MRN: [id number] Date of Birth: Sep 25, 1960   Medicare Important Message Given:  Yes-fourth notification given    Pricilla Handler 02/09/2015, 12:23 PM

## 2015-02-09 NOTE — Progress Notes (Signed)
CARDIAC REHAB PHASE I   PRE:  Rate/Rhythm: 94 SR  BP:  Supine:   Sitting: MAP 90  Standing:    SaO2: 99%RA  MODE:  Ambulation: 70 ft   POST:  Rate/Rhythm: 120 ST  BP:  Supine:   Sitting: MAP 98  Standing:    SaO2: 97%RA 0840-0907 Pt needed assistance connecting to batteries as he has little strength in right hand and has difficulty gripping. Would recommend OT consult to assist with strengthening. Pt walked 70 ft on RA with gait belt use, rolling walker and asst x 2. Pt c/o right knee pain as we walked. Tired easily. To recliner after walk. Pt able to grip walker with right hand but has decreased dexterity. Pt to power source with help of RN. Did not c/o hip pain but knee pain.   Graylon Good, RN BSN  02/09/2015 9:03 AM

## 2015-02-10 ENCOUNTER — Inpatient Hospital Stay (HOSPITAL_COMMUNITY): Payer: Medicare Other

## 2015-02-10 ENCOUNTER — Encounter (HOSPITAL_COMMUNITY): Payer: Medicare Other

## 2015-02-10 DIAGNOSIS — M7989 Other specified soft tissue disorders: Secondary | ICD-10-CM

## 2015-02-10 DIAGNOSIS — R700 Elevated erythrocyte sedimentation rate: Secondary | ICD-10-CM

## 2015-02-10 DIAGNOSIS — R51 Headache: Secondary | ICD-10-CM

## 2015-02-10 DIAGNOSIS — Z8673 Personal history of transient ischemic attack (TIA), and cerebral infarction without residual deficits: Secondary | ICD-10-CM

## 2015-02-10 DIAGNOSIS — I427 Cardiomyopathy due to drug and external agent: Secondary | ICD-10-CM

## 2015-02-10 DIAGNOSIS — Z8571 Personal history of Hodgkin lymphoma: Secondary | ICD-10-CM

## 2015-02-10 DIAGNOSIS — T451X5S Adverse effect of antineoplastic and immunosuppressive drugs, sequela: Secondary | ICD-10-CM

## 2015-02-10 DIAGNOSIS — D72829 Elevated white blood cell count, unspecified: Secondary | ICD-10-CM | POA: Insufficient documentation

## 2015-02-10 DIAGNOSIS — M25562 Pain in left knee: Secondary | ICD-10-CM

## 2015-02-10 DIAGNOSIS — N289 Disorder of kidney and ureter, unspecified: Secondary | ICD-10-CM

## 2015-02-10 DIAGNOSIS — E119 Type 2 diabetes mellitus without complications: Secondary | ICD-10-CM

## 2015-02-10 DIAGNOSIS — Z8572 Personal history of non-Hodgkin lymphomas: Secondary | ICD-10-CM

## 2015-02-10 LAB — GLUCOSE, CAPILLARY
GLUCOSE-CAPILLARY: 114 mg/dL — AB (ref 65–99)
Glucose-Capillary: 169 mg/dL — ABNORMAL HIGH (ref 65–99)
Glucose-Capillary: 200 mg/dL — ABNORMAL HIGH (ref 65–99)
Glucose-Capillary: 182 mg/dL — ABNORMAL HIGH (ref 65–99)

## 2015-02-10 LAB — LACTATE DEHYDROGENASE: LDH: 345 U/L — AB (ref 98–192)

## 2015-02-10 LAB — CBC
HEMATOCRIT: 30 % — AB (ref 39.0–52.0)
Hemoglobin: 10.1 g/dL — ABNORMAL LOW (ref 13.0–17.0)
MCH: 28.6 pg (ref 26.0–34.0)
MCHC: 33.7 g/dL (ref 30.0–36.0)
MCV: 85 fL (ref 78.0–100.0)
PLATELETS: 351 10*3/uL (ref 150–400)
RBC: 3.53 MIL/uL — ABNORMAL LOW (ref 4.22–5.81)
RDW: 14.6 % (ref 11.5–15.5)
WBC: 13.1 10*3/uL — ABNORMAL HIGH (ref 4.0–10.5)

## 2015-02-10 LAB — BASIC METABOLIC PANEL
Anion gap: 11 (ref 5–15)
BUN: 21 mg/dL — AB (ref 6–20)
CHLORIDE: 93 mmol/L — AB (ref 101–111)
CO2: 26 mmol/L (ref 22–32)
Calcium: 9 mg/dL (ref 8.9–10.3)
Creatinine, Ser: 1.06 mg/dL (ref 0.61–1.24)
GFR calc Af Amer: >60 mL/min (ref >60)
GFR calc non Af Amer: >60 mL/min (ref >60)
Glucose, Bld: 159 mg/dL — ABNORMAL HIGH (ref 65–99)
Potassium: 4.2 mmol/L (ref 3.5–5.1)
SODIUM: 130 mmol/L — AB (ref 135–145)

## 2015-02-10 LAB — CK: Total CK: 17 U/L — ABNORMAL LOW (ref 49–397)

## 2015-02-10 LAB — FERRITIN: Ferritin: 1205 ng/mL — ABNORMAL HIGH (ref 24–336)

## 2015-02-10 LAB — URIC ACID: Uric Acid, Serum: 2.9 mg/dL — ABNORMAL LOW (ref 4.4–7.6)

## 2015-02-10 NOTE — Progress Notes (Signed)
CARDIAC REHAB PHASE I   PRE:  Rate/Rhythm: 83 SR    BP: sitting 84    SaO2: 98 RA  MODE:  Ambulation: 350 ft   POST:  Rate/Rhythm: 114 ST    BP: sitting 94     SaO2: 98 RA  Pt cognition improved today. No word searching noted. Only slight pain in right knee with getting to EOB, none walking. Able to increase distance without problems, using RW, gait belt, assist x2 for supervision. Did well walking. Pt donned himself to batteries but increased time due to right hand weakness. He likes to pause alarms during each cord change. He took approximately 3 min for each cord change. Did better going back to power cord after walking, fast cord change. In recliner, will f/u tomorrow. Encouraged more walking.  [id number]   Josephina Shih [place] CES, ACSM 02/10/2015 10:33 AM

## 2015-02-10 NOTE — Progress Notes (Addendum)
Nutrition Follow-up  DOCUMENTATION CODES:   Severe malnutrition in context of chronic illness  INTERVENTION:   Glucerna Shake po TID, each supplement provides 220 kcal and 10 grams of protein  NUTRITION DIAGNOSIS:   Malnutrition related to chronic illness as evidenced by severe depletion of body fat, severe depletion of muscle mass, energy intake < or equal to 75% for > or equal to 1 month, percent weight loss.  Ongoing  GOAL:   Patient will meet greater than or equal to 90% of their needs  Progressing  MONITOR:   PO intake, Supplement acceptance, Skin, Labs  REASON FOR ASSESSMENT:   Consult Assessment of nutrition requirement/status  ASSESSMENT:   Pt admitted after 2 falls at home with left hip pain negative for fx. 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  Pt unavailable x 2; either talking on phone or ambulating in room with staff.   Chart reviewed. Pt underwent bone marrow biopsy on 02/04/15, which was unrevealing.   Per MD notes, pt is febrile today; TEE on revealed no evidence of endocarditis. Plan is to re-consult ID for further work-up.   Noted pt is very deconditioned, however, has been refusing therapy.   Glucerna shakes were ordered TID on 02/06/15 as a substitute to Ensure due to elevated blood sugars. However, pt is now off steroids and blood sugars are tending down. Intake with mild improvement (PO: 25-100%, averaging 30-50% of meals). He remains on Remeron daily.  Labs reviewed: Na: 130, Glucose: 150. CBGS: 137-378 (trending down).   Diet Order:  Diet regular Room service appropriate?: Yes; Fluid consistency:: Thin  Skin:  Reviewed, no issues (closed upper abdominal incision)  Last BM:  01/29/15  Height:   Ht Readings from Last 1 Encounters:  01/28/15 $RemoveB'5\' 7"'lnXEQytA$  (1.702 m)    Weight:   Wt Readings from Last 1 Encounters:  02/10/15 143 lb 1.3 oz (64.9 kg)    Ideal Body  Weight:  67.2 kg  BMI:  Body mass index is 22.4 kg/(m^2).  Estimated Nutritional Needs:   Kcal:  1900-2100  Protein:  95-115 grams  Fluid:  > 1.5 L/day  EDUCATION NEEDS:   No education needs identified at this time  Sundee Garland A. Jimmye Norman, RD, LDN, CDE Pager: [phone] After hours Pager: [phone]

## 2015-02-10 NOTE — Evaluation (Signed)
Speech Language Pathology Evaluation Patient Details Name: Andre Tran MRN: [id number] DOB: September 19, 1960 Today's Date: 02/10/2015 Time: 1545-1610 SLP Time Calculation (min) (ACUTE ONLY): 25 min  Problem List:  Patient Active Problem List   Diagnosis Date Noted  . Left kidney mass   . Anemia   . Failure to thrive (0-17)   . FUO (fever of unknown origin)   . Fall 01/28/2015  . Fever 01/26/2015  . Unexplained weight loss 01/26/2015  . Headache 01/12/2015  . Chronic anticoagulation 12/10/2014  . ICH (intracerebral hemorrhage) 11/24/2014  . LVAD (left ventricular assist device) present   . Acute on chronic systolic and diastolic heart failure, NYHA class 3 10/13/2014  . Acute on chronic systolic congestive heart failure 10/10/2014  . Palliative care encounter 09/17/2014  . Hypoglycemia associated with diabetes 09/17/2014  . Absolute anemia   . Chronic diastolic heart failure, NYHA class 4 09/15/2014  . Iron deficiency anemia due to chronic blood loss   . Diabetes mellitus type 2, uncomplicated 27/09/5007  . Acute on chronic systolic heart failure, NYHA class 3 06/18/2014  . Lymphoma in remission 06/18/2014  . Acute bronchitis 05/06/2013  . LV (left ventricular) mural thrombus 02/10/2013  . Syncope 12/18/2011  . Hypotension 12/18/2011  . Chronic systolic heart failure 38/18/2993  . Acute on chronic systolic heart failure 71/69/6789  . Noncompliance with diet and medication regimen 12/13/2011  . Paroxysmal ventricular tachycardia 01/20/2009  . HYPERLIPIDEMIA-MIXED 10/21/2008  . Essential hypertension 10/21/2008  . CARDIOMYOPATHY, SECONDARY 10/21/2008  . CVA 10/21/2008  . Automatic implantable cardioverter-defibrillator in situ 10/21/2008   Past Medical History:  Past Medical History  Diagnosis Date  . Paroxysmal ventricular tachycardia 2005, 2010    s/p AICD '05, replaced w/ St. Jude's in 2010 (PVT/V.Fib arrest requiring ICD implant in 2005)  . HYPERTENSION, UNSPECIFIED   .  HYPERLIPIDEMIA-MIXED   . 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 Nonischemic cardiomyopathy     2/2 Adriamycin administration for lymphoma  . CHF (congestive heart failure)     EF 15% by echo 2009; s/p St. Jude ICD  . Diabetes mellitus     Type 2  . Cancer 1992, 2007     non hodkins lymphoma 1992, Hodgkins 2007  . Depression   . LV (left ventricular) mural thrombus 2009    2009, on coumadin  . Small bowel obstruction 2001    s/p Small bowel resection 2001  . Jejunal intussusception 2008    2008  . 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 Splenic mass 2009    Noted on Abd [place] 2009 w/ recs for f/u CT - not done  . Hypotension   . Noncompliance with medication regimen   . AICD (automatic cardioverter/defibrillator) present 2005, 2010  . Presence of permanent cardiac pacemaker   . Renal insufficiency    Past Surgical History:  Past Surgical History  Procedure Laterality Date  . Cardiac defibrillator placement      2005, replaced w/ St. Jude's 2010  . Vasectomy    . Bowel resection      small bowell 2/2 obstruction 2001  . Pacemaker insertion    . Insert / replace / remove pacemaker    . Colonoscopy N/A 09/18/2014    Procedure: COLONOSCOPY;  Surgeon: [name], MD;  Location: [facility] ENDOSCOPY;  Service: Endoscopy;  Laterality:  N/A;  . Esophagogastroduodenoscopy N/A 09/18/2014    Procedure: ESOPHAGOGASTRODUODENOSCOPY (EGD);  Surgeon: [name], MD;  Location: [facility] ENDOSCOPY;  Service: Endoscopy;  Laterality: N/A;  . Left and right heart catheterization with coronary angiogram N/A 09/19/2014    Procedure: LEFT AND RIGHT HEART CATHETERIZATION WITH CORONARY ANGIOGRAM;  Surgeon: Jolaine Artist, MD;  Location: [facility] CATH LAB;  Service: Cardiovascular;  Laterality: N/A;  . Colonoscopy N/A 09/19/2014    Procedure: COLONOSCOPY;  Surgeon: [name], MD;  Location: [place];  Service: Gastroenterology;  Laterality: N/A;  . Insertion of implantable left ventricular assist device N/A 10/13/2014    Procedure: INSERTION OF IMPLANTABLE LEFT VENTRICULAR ASSIST DEVICE;  Surgeon: [name], MD;  Location: [place];  Service: Open Heart Surgery;  Laterality: N/A;  CIRC ARREST  NITRIC OXIDE  . Tee without cardioversion N/A 10/13/2014    Procedure: TRANSESOPHAGEAL ECHOCARDIOGRAM (TEE);  Surgeon: [name], MD;  Location: [place];  Service: Open Heart Surgery;  Laterality: N/A;  . Exploration post operative open heart N/A 10/13/2014    Procedure: EXPLORATION POST OPERATIVE OPEN HEART;  Surgeon: [name], MD;  Location: [place] OR;  Service: Open Heart Surgery;  Laterality: N/A;  . Cardioversion N/A 01/30/2015    Procedure: CARDIOVERSION;  Surgeon: Jolaine Artist, MD;  Location: [facility] OR;  Service: Cardiovascular;  Laterality: N/A;   HPI:  Andre Tran is a 54 y.o. male with history of lymphoma and now with anemia and FUO and failure to thrive. ID following with no obvious signs of infection. Oncology has seen the patient and is requesting IR to perform bone marrow biopsy to rule out MDS/plasma cell disorder/Leukemia. He denies any chest pain. The patient has had multiple bone marrow biopsy procedures done in the past. He has previously tolerated sedation without complications.    Assessment / Plan / Recommendation Clinical Impression   Pt presents with mild-moderate cognitive deficits characterized by decreased selective attention to tasks, decreased recall of new information, decreased functional problem solving, and decreased emergent/anticipatory awareness of deficits.  Pt also presents with mild word finding deficits in conversations which he reports to be worse from his baseline.  Per report, pt's mentation has cleared significantly in the last 24 hours.  SLP is hopeful that pt will continue to improve.  Pt would benefit from skilled ST for  cognitive monitoring and education for compensatory strategy use.      SLP Assessment  Patient needs continued Speech Lanaguage Pathology Services    Follow Up Recommendations  24 hour supervision/assistance;Other (comment) (TBD)    Frequency and Duration min 2x/week  1 week   Pertinent Vitals/Pain Pain Assessment: No/denies pain   SLP Goals  Patient/Family Stated Goal: none stated  Potential to Achieve Goals (ACUTE ONLY): Good  SLP Evaluation Prior Functioning  Cognitive/Linguistic Baseline: Information not available Type of Home: House Available Help at Discharge: Family;Available 24 hours/day   Cognition  Overall Cognitive Status: Impaired/Different from baseline Arousal/Alertness: Awake/alert Orientation Level: Oriented X4 Attention: Selective Selective Attention: Impaired Selective Attention Impairment: Verbal basic;Functional basic Memory: Impaired Memory Impairment: Decreased short term memory;Decreased recall of new information Decreased Short Term Memory: Verbal basic;Functional basic Awareness: Impaired Awareness Impairment: Anticipatory impairment;Emergent impairment Problem Solving: Impaired Problem Solving Impairment: Functional basic Safety/Judgment: Impaired    Comprehension  Auditory Comprehension Overall Auditory Comprehension: Appears within functional limits for tasks assessed    Expression Expression Primary Mode of Expression: Verbal Verbal Expression Overall Verbal Expression: Impaired at baseline Other  Verbal Expression Comments: mild word finding deficits in conversations Written Expression Dominant Hand: Left   Oral / Motor Oral Motor/Sensory Function Overall Oral Motor/Sensory Function: Appears within functional limits for tasks assessed Motor Speech Overall Motor Speech: Appears within functional limits for tasks assessed   GO     PageSelinda Orion 02/10/2015, 4:29 PM

## 2015-02-10 NOTE — Progress Notes (Signed)
Patient ID: Andre Tran, male   DOB: 09-06-1960, 54 y.o.   MRN: [id number] HeartMate 2 Rounding Note  Subjective:    Admitted after fall and failure to thrive and paroxysmal fevers. Recent 30 pound weight loss and anorexia. ESR 108. CT C/A/P no evidence of recurrent lymphoma or abscess. LDH 304>270    TEE 7/23 no vegetations 7/25 VAD speed turned down to 8400  7/26 CT Abd Chest- Concern for mass L kidney.  7/27- Bone marrow aspirate.no evidence of clear-cut leukemia, lymphoma or MDS at this time 7/28 - Urology Consulted-- recommendations for biopsy. Urine culture sent  7/29 ESR 108 -> 65, Eliquis started.  8/1 WBC 15  8/2 T Max 101.7    Denies headache/hip pain. Denies SOB     Seen by ID => signed off, do not think he has an infection. Seen by ortho => no hip problem, nothing to do.   LDH 289 => 298=>337=>345   LVAD INTERROGATION:  HeartMate II LVAD:  Flow 4.1  liters/min, speed 8400, power 4, PI 6   ~4  PI events/24 hrs   Objective:    Vital Signs:   Temp:  [97.6 F (36.4 C)-101.7 F (38.7 C)] 97.6 F (36.4 C) (08/02 0339) Pulse Rate:  [82-92] 82 (08/02 0339) Resp:  [18-22] 18 (08/02 0400) SpO2:  [97 %-100 %] 100 % (08/02 0339) Weight:  [143 lb 1.3 oz (64.9 kg)] 143 lb 1.3 oz (64.9 kg) (08/02 0339) Last BM Date: 01/29/15 Mean arterial Pressure 80s   Intake/Output:   Intake/Output Summary (Last 24 hours) at 02/10/15 0803 Last data filed at 02/09/15 2300  Gross per 24 hour  Intake    900 ml  Output    475 ml  Net    425 ml     Physical Exam: General: Sitting on the side of the bed.  Fatigued appearing.  HEENT: normal Neck: supple. JVP 5-6  Carotids 2+ bilat; no bruits. No lymphadenopathy or thryomegaly appreciated. Cor: Mechanical heart sounds with LVAD hum present. Lungs: clear Abdomen: soft, nontender, nondistended. No hepatosplenomegaly. No bruits or masses. Good bowel sounds. Driveline: C/D/I; securement device intact and driveline incorporated Extremities:  no cyanosis, clubbing, rash, edema  Neuro: alert & orientedx3, cranial nerves grossly intact. moves all 4 extremities w/o difficulty. Affect flat  Telemetry: SR 80s   Labs: Basic Metabolic Panel:  Recent Labs Lab 02/06/15 0316 02/07/15 0232 02/08/15 0222 02/09/15 0225 02/10/15 0310  NA 133* 131* 130* 130* 130*  K 4.2 4.0 4.6 4.4 4.2  CL 96* 95* 94* 96* 93*  CO2 _0 GLUCOSE 175* 158* 186* 146* 159*  BUN _1 21*  CREATININE 0.93 0.85 1.03 1.12 1.06  CALCIUM 9.5 9.4 9.0 9.1 9.0    Liver Function Tests:  Recent Labs Lab 02/08/15 0222 02/09/15 0225  AST 70* 80*  ALT 96* 92*  ALKPHOS 143* 138*  BILITOT 0.8 1.1  PROT 7.1 7.2  ALBUMIN 2.9* 3.1*   No results for input(s): LIPASE, AMYLASE in the last 168 hours. No results for input(s): AMMONIA in the last 168 hours.  CBC:  Recent Labs Lab 02/04/15 0301 02/08/15 0222 02/09/15 0225 02/10/15 0310  WBC 9.2 12.7* 15.3* 13.1*  HGB 9.5* 10.0* 9.8* 10.1*  HCT 28.1* 30.3* 30.0* 30.0*  MCV 84.1 85.4 87.0 85.0  PLT 329 378 347 351    INR:  Recent Labs Lab 02/04/15 0301 02/05/15 0220 02/06/15 0316 02/07/15 0232  INR 1.43 1.45 1.48  1.71*    Other results:    Imaging: No results found.   Medications:     Scheduled Medications: . amiodarone  200 mg Oral Daily  . amLODipine  2.5 mg Oral Daily  . apixaban  5 mg Oral BID  . atorvastatin  20 mg Oral QHS  . feeding supplement (GLUCERNA SHAKE)  237 mL Oral TID BM  . insulin aspart  0-5 Units Subcutaneous QHS  . insulin aspart  0-9 Units Subcutaneous TID WC  . insulin aspart  3 Units Subcutaneous TID WC  . insulin glargine  5 Units Subcutaneous QHS  . magnesium oxide  400 mg Oral Daily  . methocarbamol  500 mg Oral 4 times per day  . mirtazapine  7.5 mg Oral QHS  . pantoprazole  40 mg Oral Daily  . traMADol  50 mg Oral 4 times per day    Infusions:    PRN Medications: acetaminophen, albuterol, bisacodyl, fentaNYL (SUBLIMAZE)  injection, midazolam   Assessment:   1. Fever and failure to thrive 2. Chronic systolic HF - Status post LVAD implantation for DT.  - 7/22 Speed turned down to 8600.  - 7/24 speed turned down to 8400 3. H/o Non Hodgkins Lymphoma s/p therapy- CT chest/abd/pelvis ok.  4.  H/O CVA 2008  5. H/O VT/VF --St Jude ICD 2010 - Interrogated device. No arrhythmias noted.   6. DMII- not on any meds. Uncontrolled DM.  7. PSVT- Quiescent on amiodarone 200 mg daily. 8. Recent ICH 5/16.  -stable no new neuro deficits. Head CT - no recurrence 9. Hyponatremia Na 127  10. Severe malnutrition in context of chronic illness 11. R heel pain--Xray ok .  12. Headache- Resolved.  13. L hip pain- Ortho evaluated, do not think there is a problem.  Resolved.  14. LFT elevation- mild  Per Oncology Bone  Marrow biopsy-->no evidence of clear-cut leukemia, lymphoma or MDS at this times   Continue speed at 8400. LDH 345   Some improvement, headache/hip pain better.  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 Lesion in upper left pole of left kidney is exophytic and may be malignant but is very small 9 mm and f/u imaging in 3-6 months has been recommended over biopsy.Renal infarct is the cause of his fever and elevated ESR.  We have stopped steroids (got prednisone x 4 days for possible PMR without benefit). We doubt the infarct has caused his 30 pound weight loss. Investigation has been exhaustive and has not revealed any clear evidence of significant infection or malignancy (BCx, bone marrow bx, TEE, CT C/A/P, UA). As weight loss started after intracranial hemoorrhage, possible he had damage to his appetite center or other related defect. WBCs 12.7>15.3 >13 .    TMAX 101.7 WBC 13. Check blood cultures and UA.   . LDH trending up. 357>017 >345   Given evidence of intracranial bleed on coumadin with INR goal 2-3 and subsequent embolic infarct on coumadin with lower INR goal, we switched  anticoagulation to apixaban.  We have discussed pros/cons of this with Andre Tran and he understands that these agents have not been studied in patients with LVADs but that we have had success in 2 patients with somewhat similar issues of concomitant bleeding and thrombosis.    I reviewed the LVAD parameters from today, and compared the results to the patient's prior recorded data.  No programming changes were made.  The LVAD is functioning within specified parameters.  The  patient performs LVAD self-test daily.  LVAD interrogation was negative for any significant power changes, alarms or PI events/speed drops.  LVAD equipment check completed and is in good working order.  Back-up equipment present.   LVAD education done on emergency procedures and precautions and reviewed exit site care.  Length of Stay: 22  CLEGG,AMY NP-C  02/10/2015, 8:03 AM  VAD Team --- VAD ISSUES ONLY--- Pager [phone] (7am - 7am)  Advanced Heart Failure Team  Pager [phone] (M-F; 7a - 4p)  Please contact [facility] for night-coverage after hours (4p -7a ) and weekends on [email or web address]  Patient seen and examined with Darrick Grinder, NP. We discussed all aspects of the encounter. I agree with the assessment and plan as stated above.   He is febrile again. Cultures redrawn. I have reviewed TEE images with Dr. Aundra Dubin this am and there is no evidence of endocarditis. Thus we still do not have explanation for his fevers or unifying diagnosis for weight loss, fevers and markedly elevated ESR. He was on low-dose prednisone for several days for possible PMR but although it curbed fevers it did not change overall condition much I have asked ID to see him agin today for any other thoughts.   VAD parameters are stable.   Bensimhon, Daniel,MD 9:33 AM

## 2015-02-10 NOTE — Progress Notes (Signed)
Patient's temperature 101.7, paged LVAD coordinator to inform per orders. Vicie Mutters, RN

## 2015-02-10 NOTE — Consult Note (Signed)
[name]  Total days of antibiotics 0               Reason for Consult: Reconsultation for fever of unknown origin     Referring Physician: Dr. Haroldine Laws  Principal Problem:   Fever Active Problems:   Chronic systolic heart failure   LVAD (left ventricular assist device) present   Unexplained weight loss   Fall   Failure to thrive (0-17)   FUO (fever of unknown origin)   Anemia   Left kidney mass    HPI: Andre Tran is a 54 y.o. 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 Patient had reported daily subjective fevers for last 6 years, but now fevers occurring less frequently about twice per month. He notes fever typically do not get higher than 99 degrees. Also noted to have a 30 lb weight loss within the last 6 months. His temperature was between 102-103 from 7/22-7/23, went down to normal and then spiked again to 101.5 on 7/27, 100s on 7/31 and then again to 101.7 on 8/1. He was started on Prednisone 10 mg daily from 7/26-7/29 and fevers remained in 99 range.   Extensive work up has been pursued already. Blood cultures on 7/18 and 7/22 have no growth. TEE on 7/23 negative for any valvular vegetations. Oncology was consulted and recommended SPEP and a bone marrow biopsy to rule out reoccurrence of his lymphoma. Both SPEP and bone marrow biopsy negative for malignancy. CT Chest/Abdomen/Pelvis only significant for a 9 x 7 mm hypodensity on the left kidney. This was further evaluated by renal U/S and likely to represent a renal infarct or pyelonephritis. U/A on 7/23 and urine culture on 7/28 negative for infection. A repeat U/A is  pending. His LDH is elevated in the 300 range, but this has been persistent since 2015. ESR elevated at 108. ANA negative. His TSH was normal at 0.820 and T4 mildly elevated at 1.35. Erlichiosis and [facility] Spotted Fever antibodies were checked and also negative.   Patient's only complaint is right knee pain for the last 2-3 days. He states he has noticed swelling as well. He denies trauma to this knee as he fell on his left side. He denies any history of gout or arthritis. He denies any family history of autoimmune disease (lupus, scleroderma, RA). He denies any recent rashes, night sweats, skin changes, and enlarged lymph nodes. He denies fatigue despite his heart failure. He does note headaches since April 4th (able to give me exact date) but relates the headaches to a medication he was started on. He describes headaches as occurring occasionally, located in the frontal and right temporal regions, and mild. He denies changes in vision, facial tenderness, and jaw claudication. He does [place] He denies seeing any ticks on himself or being bitten. He reports travel to [facility] area about 2 weeks ago, but spent most of the time inside. He denies recent travel outside of the [place]. He denies sore throat, congestion, chest pain, cough, abdominal pain, dysuria, hematuria, and diarrhea.   Past Medical History  Diagnosis Date  . Paroxysmal ventricular tachycardia 2005, 2010    s/p AICD '  05, replaced w/ St. Jude's in 2010 (PVT/V.Fib arrest requiring ICD implant in 2005)  . HYPERTENSION, UNSPECIFIED   . HYPERLIPIDEMIA-MIXED   . 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 Nonischemic cardiomyopathy     2/2 Adriamycin administration for lymphoma  . CHF (congestive heart failure)     EF 15% by echo 2009; s/p St. Jude ICD  . Diabetes mellitus     Type 2  . Cancer 1992, 2007       non hodkins lymphoma 1992, Hodgkins 2007  . Depression   . LV (left ventricular) mural thrombus 2009    2009, on coumadin  . Small bowel obstruction 2001    s/p Small bowel resection 2001  . Jejunal intussusception 2008    2008  . 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 Splenic mass 2009    Noted on Abd [place] 2009 w/ recs for f/u CT - not done  . Hypotension   . Noncompliance with medication regimen   . AICD (automatic cardioverter/defibrillator) present 2005, 2010  . Presence of permanent cardiac pacemaker   . Renal insufficiency     Allergies:  Allergies  Allergen Reactions  . Hydralazine Other (See Comments)    Severe headaches     Current antibiotics: None  MEDICATIONS: . amiodarone  200 mg Oral Daily  . amLODipine  2.5 mg Oral Daily  . apixaban  5 mg Oral BID  . atorvastatin  20 mg Oral QHS  . feeding supplement (GLUCERNA SHAKE)  237 mL Oral TID BM  . insulin aspart  0-5 Units Subcutaneous QHS  . insulin aspart  0-9 Units Subcutaneous TID WC  . insulin aspart  3 Units Subcutaneous TID WC  . insulin glargine  5 Units Subcutaneous QHS  . magnesium oxide  400 mg Oral Daily  . methocarbamol  500 mg Oral 4 times per day  . mirtazapine  7.5 mg Oral QHS  . pantoprazole  40 mg Oral Daily  . traMADol  50 mg Oral 4 times per day    History  Substance Use Topics  . Smoking status: Never Smoker   . Smokeless tobacco: Never Used  . Alcohol Use: 1.2 oz/week    1 Cans of beer, 1 Shots of liquor per week     Comment: ON SPECIAL OCCASIONS    Family History  Problem Relation Age of Onset  . Other      No known family h/o heart disease  . Heart disease    . Diabetes Mother   . Other Other     complications from hip replacement    Review of Systems -  General: Denies changes in appetite HEENT: Denies ear pain CV: Denies palpitations, orthopnea Pulm: Denies wheezing GI: Denies nausea, vomiting, constipation, melena,  hematochezia GU: Denies frequency Msk: See HPI Neuro: Reports weakness- chronic right-sided due to stroke. Denies numbness, tingling Skin: Denies bruising   OBJECTIVE: Temp:  [97.6 F (36.4 C)-101.7 F (38.7 C)] 97.6 F (36.4 C) (08/02 0339) Pulse Rate:  [82-92] 82 (08/02 0339) Resp:  [17-22] 17 (08/02 0800) SpO2:  [97 %-100 %] 100 % (08/02 0339) Weight:  [143 lb 1.3 oz (64.9 kg)] 143 lb 1.3 oz (64.9 kg) (08/02 0339) General: thin man sitting up in bed, NAD, pleasant HEENT: [place]/AT, EOMI, sclera anicteric, pharynx non-erythematous with no exudates, mucus membranes  moist CV: Regular rate, LVAD humming Pulm: CTA bilaterally, breaths non-labored  Abd: BS+, soft, non-tender, non-distended  Ext: Right knee appears swollen compared to left. Mild tenderness to palpation of anterior right knee. No erythema. No tenderness or fluctuance palpated on posterior knee.  Neuro: alert and oriented x 3. Occasionally has difficulty finding words.  Skin: No rashes or bruising noted.   LABS: Results for orders placed or performed during the hospital encounter of 01/28/15 (from the past 48 hour(s))  Glucose, capillary     Status: Abnormal   Collection Time: 02/08/15 12:09 PM  Result Value Ref Range   Glucose-Capillary 154 (H) 65 - 99 mg/dL   Comment 1 Venous Specimen    Comment 2 Notify RN   Glucose, capillary     Status: Abnormal   Collection Time: 02/08/15  5:00 PM  Result Value Ref Range   Glucose-Capillary 141 (H) 65 - 99 mg/dL   Comment 1 Capillary Specimen   Glucose, capillary     Status: Abnormal   Collection Time: 02/08/15  8:57 PM  Result Value Ref Range   Glucose-Capillary 137 (H) 65 - 99 mg/dL  Lactate dehydrogenase     Status: Abnormal   Collection Time: 02/09/15  2:25 AM  Result Value Ref Range   LDH 337 (H) 98 - 192 U/L  Comprehensive metabolic panel     Status: Abnormal   Collection Time: 02/09/15  2:25 AM  Result Value Ref Range   Sodium 130 (L) 135 - 145 mmol/L   Potassium  4.4 3.5 - 5.1 mmol/L   Chloride 96 (L) 101 - 111 mmol/L   CO2 26 22 - 32 mmol/L   Glucose, Bld 146 (H) 65 - 99 mg/dL   BUN 18 6 - 20 mg/dL   Creatinine, Ser 1.12 0.61 - 1.24 mg/dL   Calcium 9.1 8.9 - 10.3 mg/dL   Total Protein 7.2 6.5 - 8.1 g/dL   Albumin 3.1 (L) 3.5 - 5.0 g/dL   AST 80 (H) 15 - 41 U/L   ALT 92 (H) 17 - 63 U/L   Alkaline Phosphatase 138 (H) 38 - 126 U/L   Total Bilirubin 1.1 0.3 - 1.2 mg/dL   GFR calc non Af Amer >60 >60 mL/min   GFR calc Af Amer >60 >60 mL/min    Comment: (NOTE) The eGFR has been calculated using the CKD EPI equation. This calculation has not been validated in all clinical situations. eGFR's persistently <60 mL/min signify possible Chronic Kidney Disease.    Anion gap 8 5 - 15  CBC     Status: Abnormal   Collection Time: 02/09/15  2:25 AM  Result Value Ref Range   WBC 15.3 (H) 4.0 - 10.5 K/uL   RBC 3.45 (L) 4.22 - 5.81 MIL/uL   Hemoglobin 9.8 (L) 13.0 - 17.0 g/dL   HCT 30.0 (L) 39.0 - 52.0 %   MCV 87.0 78.0 - 100.0 fL   MCH 28.4 26.0 - 34.0 pg   MCHC 32.7 30.0 - 36.0 g/dL   RDW 14.8 11.5 - 15.5 %   Platelets 347 150 - 400 K/uL  Glucose, capillary     Status: Abnormal   Collection Time: 02/09/15  7:54 AM  Result Value Ref Range   Glucose-Capillary 153 (H) 65 - 99 mg/dL   Comment 1 Capillary Specimen   Glucose, capillary     Status: Abnormal   Collection Time: 02/09/15 11:49 AM  Result Value Ref Range   Glucose-Capillary 212 (H) 65 -  99 mg/dL   Comment 1 Capillary Specimen   Glucose, capillary     Status: Abnormal   Collection Time: 02/09/15  4:42 PM  Result Value Ref Range   Glucose-Capillary 141 (H) 65 - 99 mg/dL   Comment 1 Capillary Specimen   Glucose, capillary     Status: Abnormal   Collection Time: 02/09/15 10:22 PM  Result Value Ref Range   Glucose-Capillary 195 (H) 65 - 99 mg/dL   Comment 1 Capillary Specimen   Lactate dehydrogenase     Status: Abnormal   Collection Time: 02/10/15  3:10 AM  Result Value Ref Range   LDH  345 (H) 98 - 192 U/L  Basic metabolic panel     Status: Abnormal   Collection Time: 02/10/15  3:10 AM  Result Value Ref Range   Sodium 130 (L) 135 - 145 mmol/L   Potassium 4.2 3.5 - 5.1 mmol/L   Chloride 93 (L) 101 - 111 mmol/L   CO2 26 22 - 32 mmol/L   Glucose, Bld 159 (H) 65 - 99 mg/dL   BUN 21 (H) 6 - 20 mg/dL   Creatinine, Ser 1.06 0.61 - 1.24 mg/dL   Calcium 9.0 8.9 - 10.3 mg/dL   GFR calc non Af Amer >60 >60 mL/min   GFR calc Af Amer >60 >60 mL/min    Comment: (NOTE) The eGFR has been calculated using the CKD EPI equation. This calculation has not been validated in all clinical situations. eGFR's persistently <60 mL/min signify possible Chronic Kidney Disease.    Anion gap 11 5 - 15  CBC     Status: Abnormal   Collection Time: 02/10/15  3:10 AM  Result Value Ref Range   WBC 13.1 (H) 4.0 - 10.5 K/uL   RBC 3.53 (L) 4.22 - 5.81 MIL/uL   Hemoglobin 10.1 (L) 13.0 - 17.0 g/dL   HCT 30.0 (L) 39.0 - 52.0 %   MCV 85.0 78.0 - 100.0 fL   MCH 28.6 26.0 - 34.0 pg   MCHC 33.7 30.0 - 36.0 g/dL   RDW 14.6 11.5 - 15.5 %   Platelets 351 150 - 400 K/uL  Glucose, capillary     Status: Abnormal   Collection Time: 02/10/15  7:51 AM  Result Value Ref Range   Glucose-Capillary 169 (H) 65 - 99 mg/dL   Comment 1 Capillary Specimen     MICRO: Blood cx 7/18>> negative Blood cx 7/22>> negative Urine cx 7/28 >> negative  Blood cx 8/2>>    IMAGING: CT Chest/Abd/Pelvis 7/20>> No acute processes.  CT Head 7/22>> chronic ischemic changes from prior infarct X-ray right foot 7/24>> Negative for abnormalities CT Head 7/26>> Chronic ischemic changes from prior infarct. No new hemorrhage. CT Chest/Abd/Pelvis 7/26>> ill-defined low density (2cm) on lateral portion of midpole of left kidney, mild perinephritic stranding. Infarct vs infection.  Renal US 7/26>> Differential remains same as CT.   Assessment/Plan:    Mr. Agyeman is a 54 y.o. man with PMHx of Non-Hodgkin's and Hodgkin's lymphoma,  chemo-induced cardiomyopathy with LVAD, CVA, type 2 DM, and spontaneous intracranial hemorrhage in May 2016 who initially presented after a syncopal episode that resulted in a fall and now with fever of unknown origin.   Fever of unknown origin:  Patient with persistent intermittent fevers without unknown source despite extensive work up already. Certainly endocarditis should be of concern given his cardiac history, but TEE was negative. I spoke with Dr. Haroldine Laws and he had reviewed TEE again with no findings. Last  fever 101.7 last night. His fevers did decrease to 99's after a short course of low-dose prednisone 10 mg daily from 7/26-7/29. With his elevated ESR and headaches this could be suggestive of an autoimmune/inflammatory condition. However, his ANA was negative making lupus, scleroderma, or a connective tissue disorder less likely. I do not think he has giant cell arteritis as no jaw claudication, tenderness on exam, or changes in vision. His only complaint and focal finding on exam was right knee pain and swelling. I think we should focus on ruling out an infection vs gout vs RA.  Would be most beneficial if we could tap knee and be able to analyze fluid. I'm not sure there is a large enough effusion though. Another possible source is his left kidney density as there was some perinephric stranding suggesting an infection, but he denies any urinary symptoms. Adult Still's disease is in the differential as this can present with fevers and arthralgias, but doubt as uncommon. An undetected malignancy is also concerning given the patient's history of lymphoma and recent weight loss. His calcium is normal, but LDH elevated and will assess uric acid level. Intravascular hemolysis can also cause a fever, will check haptoglobin.  Recommend: - X-ray right knee to evaluate for effusion - Consider consulting IR to tap knee for fluid analysis. If able to tap, get cell count, crystal analysis, gram stain,  culture - f/u UA, repeat blood cx - Hold off on steroids for now in case of infection in knee - Check RF, CCP, ferritin (adult Still's), uric acid, CPK, haptoglobin  - Will check HIV- he did have a nonreactive result in Dec 2015  Attending note to follow.   Thank you for this interesting consult.   Albin Felling, MD, MPH Internal Medicine Resident, PGY-II Pager: [phone]

## 2015-02-11 ENCOUNTER — Encounter (HOSPITAL_COMMUNITY): Payer: Self-pay

## 2015-02-11 ENCOUNTER — Ambulatory Visit (HOSPITAL_COMMUNITY): Payer: Medicare Other

## 2015-02-11 DIAGNOSIS — M2550 Pain in unspecified joint: Secondary | ICD-10-CM

## 2015-02-11 DIAGNOSIS — R509 Fever, unspecified: Secondary | ICD-10-CM | POA: Insufficient documentation

## 2015-02-11 DIAGNOSIS — M25461 Effusion, right knee: Secondary | ICD-10-CM

## 2015-02-11 DIAGNOSIS — Z79899 Other long term (current) drug therapy: Secondary | ICD-10-CM

## 2015-02-11 DIAGNOSIS — T451X5D Adverse effect of antineoplastic and immunosuppressive drugs, subsequent encounter: Secondary | ICD-10-CM

## 2015-02-11 DIAGNOSIS — R945 Abnormal results of liver function studies: Secondary | ICD-10-CM

## 2015-02-11 DIAGNOSIS — M25569 Pain in unspecified knee: Secondary | ICD-10-CM | POA: Insufficient documentation

## 2015-02-11 LAB — BASIC METABOLIC PANEL
Anion gap: 14 (ref 5–15)
BUN: 22 mg/dL — AB (ref 6–20)
CALCIUM: 9.3 mg/dL (ref 8.9–10.3)
CO2: 24 mmol/L (ref 22–32)
Chloride: 94 mmol/L — ABNORMAL LOW (ref 101–111)
Creatinine, Ser: 1.11 mg/dL (ref 0.61–1.24)
GFR calc Af Amer: >60 mL/min (ref >60)
GFR calc non Af Amer: >60 mL/min (ref >60)
GLUCOSE: 135 mg/dL — AB (ref 65–99)
Potassium: 4.5 mmol/L (ref 3.5–5.1)
SODIUM: 132 mmol/L — AB (ref 135–145)

## 2015-02-11 LAB — GLUCOSE, CAPILLARY
GLUCOSE-CAPILLARY: 214 mg/dL — AB (ref 65–99)
GLUCOSE-CAPILLARY: 239 mg/dL — AB (ref 65–99)
GLUCOSE-CAPILLARY: 93 mg/dL (ref 65–99)
Glucose-Capillary: 200 mg/dL — ABNORMAL HIGH (ref 65–99)

## 2015-02-11 LAB — CBC
HCT: 29.3 % — ABNORMAL LOW (ref 39.0–52.0)
Hemoglobin: 9.6 g/dL — ABNORMAL LOW (ref 13.0–17.0)
MCH: 28.2 pg (ref 26.0–34.0)
MCHC: 32.8 g/dL (ref 30.0–36.0)
MCV: 85.9 fL (ref 78.0–100.0)
Platelets: 374 10*3/uL (ref 150–400)
RBC: 3.41 MIL/uL — ABNORMAL LOW (ref 4.22–5.81)
RDW: 14.5 % (ref 11.5–15.5)
WBC: 12.4 10*3/uL — ABNORMAL HIGH (ref 4.0–10.5)

## 2015-02-11 LAB — URINALYSIS, ROUTINE W REFLEX MICROSCOPIC
BILIRUBIN URINE: NEGATIVE
Glucose, UA: NEGATIVE mg/dL
Ketones, ur: NEGATIVE mg/dL
Nitrite: NEGATIVE
PH: 5.5 (ref 5.0–8.0)
SPECIFIC GRAVITY, URINE: 1.026 (ref 1.005–1.030)
Urobilinogen, UA: 0.2 mg/dL (ref 0.0–1.0)

## 2015-02-11 LAB — HAPTOGLOBIN

## 2015-02-11 LAB — URINE MICROSCOPIC-ADD ON

## 2015-02-11 LAB — HIV ANTIBODY (ROUTINE TESTING W REFLEX)
HIV Screen 4th Generation wRfx: NONREACTIVE
HIV Screen 4th Generation wRfx: NONREACTIVE

## 2015-02-11 LAB — FERRITIN: FERRITIN: 1280 ng/mL — AB (ref 24–336)

## 2015-02-11 LAB — URIC ACID: Uric Acid, Serum: 2.8 mg/dL — ABNORMAL LOW (ref 4.4–7.6)

## 2015-02-11 LAB — LACTATE DEHYDROGENASE: LDH: 398 U/L — ABNORMAL HIGH (ref 98–192)

## 2015-02-11 LAB — RHEUMATOID FACTOR: Rhuematoid fact SerPl-aCnc: 16.7 IU/mL — ABNORMAL HIGH (ref 0.0–13.9)

## 2015-02-11 NOTE — Progress Notes (Addendum)
Patient ID: Andre Tran, male   DOB: [birth year], 54 y.o.   MRN: [id number] HeartMate 2 Rounding Note  Subjective:    Admitted after fall and failure to thrive and paroxysmal fevers. Recent 30 pound weight loss and anorexia. ESR 108. CT C/A/P no evidence of recurrent lymphoma or abscess. LDH 304>270    TEE 7/23 no vegetations 7/25 VAD speed turned down to 8400  7/26 CT Abd Chest- Concern for mass L kidney.  7/27- Bone marrow aspirate.no evidence of clear-cut leukemia, lymphoma or MDS at this time 7/28 - Urology Consulted-- recommendations for biopsy. Urine culture sent  7/29 ESR 108 -> 65, Eliquis started.  8/1 WBC 15  8/2 T Max 101.7    Denies headache/hip pain. Denies SOB. Tmax 99.5 overnight. Feels weak. Anorexic.   Seen by ID again yesterday => multiple serologies sent and knee film ordered. Only major abnormality is markedly elevated ferritin (1,200) and mildly elevated RF suggesting diffuse inflammatory process (acute-phase reactant)   Seen by ortho => no hip problem, nothing to do.   LDH 289 => 298=>337=>345 => pending  LVAD INTERROGATION:  HeartMate II LVAD:  Flow 4.1  liters/min, speed 8400, power 4, PI 6   ~4  PI events/24 hrs   Objective:    Vital Signs:   Temp:  [97.7 F (36.5 C)-99.5 F (37.5 C)] 98.6 F (37 C) (08/03 0746) Resp:  [17-20] 20 (08/03 0742) SpO2:  [100 %] 100 % (08/03 0742) Weight:  [143 lb 8.3 oz (65.1 kg)] 143 lb 8.3 oz (65.1 kg) (08/03 0343) Last BM Date: 01/29/15 Mean arterial Pressure 80s   Intake/Output:   Intake/Output Summary (Last 24 hours) at 02/11/15 0817 Last data filed at 02/11/15 0343  Gross per 24 hour  Intake    240 ml  Output    300 ml  Net    -60 ml     Physical Exam: General: Lying in bed.  Fatigued appearing.  HEENT: normal Neck: supple. JVP 5-6  Carotids 2+ bilat; no bruits. No lymphadenopathy or thryomegaly appreciated. Cor: Mechanical heart sounds with LVAD hum present. Lungs: clear Abdomen: soft, nontender,  nondistended. No hepatosplenomegaly. No bruits or masses. Good bowel sounds. Driveline: C/D/I; securement device intact and driveline incorporated Extremities: no cyanosis, clubbing, rash, edema  Neuro: alert & orientedx3, cranial nerves grossly intact. moves all 4 extremities w/o difficulty. Affect flat  Telemetry: SR 80s   Labs: Basic Metabolic Panel:  Recent Labs Lab 02/06/15 0316 02/07/15 0232 02/08/15 0222 02/09/15 0225 02/10/15 0310  NA 133* 131* 130* 130* 130*  K 4.2 4.0 4.6 4.4 4.2  CL 96* 95* 94* 96* 93*  CO2 $Re'30 27 26 26 26  'SRU$ GLUCOSE 175* 158* 186* 146* 159*  BUN $Re'18 17 18 18 'Jzb$ 21*  CREATININE 0.93 0.85 1.03 1.12 1.06  CALCIUM 9.5 9.4 9.0 9.1 9.0    Liver Function Tests:  Recent Labs Lab 02/08/15 0222 02/09/15 0225  AST 70* 80*  ALT 96* 92*  ALKPHOS 143* 138*  BILITOT 0.8 1.1  PROT 7.1 7.2  ALBUMIN 2.9* 3.1*   No results for input(s): LIPASE, AMYLASE in the last 168 hours. No results for input(s): AMMONIA in the last 168 hours.  CBC:  Recent Labs Lab 02/08/15 0222 02/09/15 0225 02/10/15 0310 02/11/15 0301  WBC 12.7* 15.3* 13.1* 12.4*  HGB 10.0* 9.8* 10.1* 9.6*  HCT 30.3* 30.0* 30.0* 29.3*  MCV 85.4 87.0 85.0 85.9  PLT 378 347 351 374    INR:  Recent Labs Lab  02/05/15 0220 02/06/15 0316 02/07/15 0232  INR 1.45 1.48 1.71*    Other results:    Imaging: Dg Knee 1-2 Views Right  02/10/2015   CLINICAL DATA:  Knee pain and swelling for 2 weeks after fall. Evaluate for effusion for possible tap.  EXAM: RIGHT KNEE - 1-2 VIEW  COMPARISON:  None.  FINDINGS: No fracture. No bone lesion. Knee joint spaces are well maintained. There are no significant arthropathic changes.  Minimal joint effusion is suggested in the suprapatellar portion of the joint capsule.  Soft tissues are unremarkable.  IMPRESSION: 1. Probable minimal joint effusion. 2. No fracture or arthropathic change.   Electronically Signed   By: Lajean Manes M.D.   On: 02/10/2015 17:37      Medications:     Scheduled Medications: . amiodarone  200 mg Oral Daily  . amLODipine  2.5 mg Oral Daily  . apixaban  5 mg Oral BID  . atorvastatin  20 mg Oral QHS  . feeding supplement (GLUCERNA SHAKE)  237 mL Oral TID BM  . insulin aspart  0-5 Units Subcutaneous QHS  . insulin aspart  0-9 Units Subcutaneous TID WC  . insulin aspart  3 Units Subcutaneous TID WC  . insulin glargine  5 Units Subcutaneous QHS  . magnesium oxide  400 mg Oral Daily  . methocarbamol  500 mg Oral 4 times per day  . mirtazapine  7.5 mg Oral QHS  . pantoprazole  40 mg Oral Daily  . traMADol  50 mg Oral 4 times per day    Infusions:    PRN Medications: acetaminophen, albuterol, bisacodyl, fentaNYL (SUBLIMAZE) injection, midazolam   Assessment:   1. Fever and failure to thrive 2. Chronic systolic HF - Status post LVAD implantation for DT.  - 7/22 Speed turned down to 8600.  - 7/24 speed turned down to 8400 3. H/o Non Hodgkins Lymphoma s/p therapy- CT chest/abd/pelvis ok.  4.  H/O CVA 2008  5. H/O VT/VF --St Jude ICD 2010 - Interrogated device. No arrhythmias noted.   6. DMII- not on any meds. Uncontrolled DM.  7. PSVT- Quiescent on amiodarone 200 mg daily. 8. Recent ICH 5/16.  -stable no new neuro deficits. Head CT - no recurrence 9. Hyponatremia Na 127  10. Severe malnutrition in context of chronic illness 11. R heel pain--Xray ok .  12. Headache- Resolved.  13. L hip pain- Ortho evaluated, do not think there is a problem.  Resolved.  14. LFT elevation- mild  Per Oncology Bone  Marrow biopsy-->no evidence of clear-cut leukemia, lymphoma or MDS at this times   I reviewed the LVAD parameters from today, and compared the results to the patient's prior recorded data.  No programming changes were made.  The LVAD is functioning within specified parameters.  The patient performs LVAD self-test daily.  LVAD interrogation was negative for any significant power changes, alarms or PI  events/speed drops.  LVAD equipment check completed and is in good working order.  Back-up equipment present.   LVAD education done on emergency procedures and precautions and reviewed exit site care.  Length of Stay: 66  CLEGG,AMY NP-C  02/11/2015, 8:17 AM  VAD Team --- VAD ISSUES ONLY--- Pager [phone] (7am - 7am)  Advanced Heart Failure Team  Pager [phone] (M-F; 7a - 4p)  Please contact [facility] for night-coverage after hours (4p -7a ) and weekends on [email or web address]   Patient seen and examined with Darrick Grinder, NP. We discussed all aspects of the encounter. I  agree with the assessment and plan as stated above.   Continues with persistent fevers in setting of recent 30 lb weight loss. Increasingly weak and anorexic and I am continue to be very worried about his downward spiral.   Appreciate ID input. Markedly elevated ferritin and mildly elevated RF suggest diffuse inflammatory process. Will contact Rheumatology by phone and see if they have any suggestions. May be worth re-initiating low-dose steroids and perhaps this will help stimulate appetite. Await ID follow-up recs.   LDH creeping up slightly. Will continue to follow. Now on apixaban. Continue to mobilize. Can decrease frequency of lab draws.   VAD parameters stable.   Saxon Crosby,MD 9:06 AM

## 2015-02-11 NOTE — Progress Notes (Signed)
02/10/15 1400  OT Visit Information  Last OT Received On 02/10/15  Assistance Needed +1  History of Present Illness Pt admitted after 2 falls at home  with left hip pain negative for fx. 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  Precautions  Precautions Fall  Precaution Comments LVAD  Restrictions  Weight Bearing Restrictions No  Home Living  Family/patient expects to be discharged to: Private residence  Living Arrangements Spouse/significant other  Available Help at Discharge Family;Available 24 hours/day  Type of Home House  Home Access Stairs to enter  Entrance Stairs-Number of Steps 5  Entrance Stairs-Rails Right;Left;Can reach both  Home Layout 1/2 bath on main level;Two level  Alternate Level Stairs-Number of Steps flight  Alternate Level Stairs-Rails Right;Left;Can reach both  Animal nutritionist - 4 wheels;Cane - single point;BSC;Wheelchair - manual;Shower seat  Prior Function  Level of Independence Independent  Comments Unable to work or drive since CVA, sponge bathes and dresses himself with assist for cooking and household management  Communication  Communication Expressive difficulties  Pain Assessment  Pain Assessment No/denies pain  Cognition  Arousal/Alertness Awake/alert  Behavior During Therapy WFL for tasks assessed/performed  Overall Cognitive Status No family/caregiver present to determine baseline cognitive functioning  Area of Impairment Problem solving  Problem Solving Slow processing;Difficulty sequencing;Requires verbal cues  Upper Extremity Assessment  Upper Extremity Assessment RUE deficits/detail  RUE Deficits / Details longstanding weakness due to CVAs x 2, but more incoordinated with 4-/5 wrist, forearm and gross grasp. Decreased wrist positioning for functional use of digits for grasp.  RUE Sensation  decreased light touch;decreased proprioception (impaired temperature)  RUE Coordination decreased fine motor;decreased gross motor  Lower Extremity Assessment  Lower Extremity Assessment Defer to PT evaluation  Cervical / Trunk Assessment  Cervical / Trunk Assessment Normal  ADL  Overall ADL's  Needs assistance/impaired  Eating/Feeding Modified independent;Sitting  Grooming Set up;Wash/dry hands;Wash/dry face;Sitting  Upper Body Bathing Sitting;Set up  Lower Body Bathing Sit to/from stand;Supervison/ safety  Upper Body Dressing  Sitting;Set up  Lower Body Dressing Sit to/from stand;Supervision/safety  Toilet Transfer Stand-pivot;Supervision/safety  Toileting- Clothing Manipulation and Hygiene Sit to/from stand;Supervision/safety  Functional mobility during ADLs Rolling walker;Supervision/safety  Vision- History  Patient Visual Report No change from baseline  Bed Mobility  General bed mobility comments pt in chair  Transfers  Overall transfer level Needs assistance  Equipment used Rolling walker (2 wheeled)  Transfers Sit to/from Stand;Stand Pivot Transfers  Sit to Stand Supervision  Stand pivot transfers Supervision  General transfer comment no physical assist, safety, assist to manage lines  Balance  Sitting balance-Leahy Scale Good  Standing balance-Leahy Scale Fair  Exercises  Exercises Other exercises  Other Exercises  Other Exercises soft theraputty issued, instructed in wrist stabilization exercise, 3 jaw check and gross grasp and release.  Performed weight bearing and weight shift in standing on extended wrist.  OT - End of Session  Equipment Utilized During Treatment Rolling walker  Activity Tolerance Patient tolerated treatment well  Patient left in chair;with call bell/phone within reach  OT Assessment  OT Therapy Diagnosis  Generalized weakness;Hemiplegia non-dominant side  OT Recommendation/Assessment Patient needs continued OT Services  OT Problem List  Decreased strength;Impaired balance (sitting and/or standing);Impaired sensation;Impaired UE functional use;Decreased coordination  OT Plan  OT Frequency (ACUTE ONLY) Min 2X/week  OT Treatment/Interventions (ACUTE ONLY) Therapeutic exercise;Self-care/ADL training;Therapeutic activities;Patient/family education  OT Recommendation  Follow Up Recommendations No OT follow up  OT Equipment None recommended by OT  Individuals Consulted  Consulted and Agree with Results and Recommendations Patient  Acute Rehab OT Goals  Patient Stated Goal return home  OT Goal Formulation With patient  Time For Goal Achievement 02/24/15  Potential to Achieve Goals Good  OT Time Calculation  OT Start Time (ACUTE ONLY) 1348  OT Stop Time (ACUTE ONLY) 1424  OT Time Calculation (min) 36 min  OT General Charges  $OT Visit 1 Procedure  OT Evaluation  $Initial OT Evaluation Tier I 1 Procedure  OT Treatments  $Therapeutic Exercise 8-22 mins  Written Expression  Dominant Hand Left  02/11/2015 Nestor Lewandowsky, OTR/L Pager: [phone]

## 2015-02-11 NOTE — Progress Notes (Signed)
Occupational Therapy Treatment Patient Details Name: Andre Tran MRN: [id number] DOB: 02-16-1961 Today's Date: 02/11/2015    History of present illness Pt admitted after 2 falls at home  with left hip pain negative for fx. Pt with recurrent fevers of unknown etiology. 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   OT comments  Focus of session on strengthening activities L forearm, wrist, and hand, wrist stabilization activities and finger and fine motor strengthening with goal of improving pt's ability to perform LVAD power source change independently.  Follow Up Recommendations  No OT follow up    Equipment Recommendations  None recommended by OT    Recommendations for Other Services      Precautions / Restrictions Precautions Precautions: Fall Precaution Comments: LVAD       Mobility Bed Mobility Overal bed mobility: Modified Independent                Transfers Overall transfer level: Modified independent   Transfers: Sit to/from Stand Sit to Stand: Modified independent (Device/Increase time)         General transfer comment: no physical assist, safety, assist to manage lines    Balance             Standing balance-Leahy Scale: Fair                     ADL                                                Vision                     Perception     Praxis      Cognition   Behavior During Therapy: WFL for tasks assessed/performed Overall Cognitive Status: No family/caregiver present to determine baseline cognitive functioning Area of Impairment: Problem solving              Problem Solving: Slow processing General Comments: needs commands repeated or demonstrated with exercise program    Extremity/Trunk Assessment               Exercises General Exercises - Upper Extremity Bar Weights/Barbell (Wrist Flexion):  (1/2  lb) Wrist Extension: Strengthening;10 reps;Seated;Bar weights/barbell;Right Bar Weights/Barbell (Wrist Extension): Other (comment) (1/2 lb) Hand Exercises Forearm Supination: Strengthening;Right;10 reps;Seated;Bar weights/barbell (1/2 lb) Forearm Pronation: Strengthening;Right;10 reps;Seated;Bar weights/barbell (1/2 lb) Other Exercises Other Exercises: standing wrist stabilization activities, theraputty exercises--finger extension, gross grasp, pinch.   Shoulder Instructions       General Comments      Pertinent Vitals/ Pain       Pain Assessment: No/denies pain  Home Living                                          Prior Functioning/Environment              Frequency Min 2X/week     Progress Toward Goals  OT Goals(current goals can now be found in the care plan section)  Progress towards OT goals: Progressing toward goals  Acute Rehab OT Goals Patient Stated Goal: return home Time For Goal Achievement: 02/24/15  Plan Discharge plan remains  appropriate    Co-evaluation                 End of Session     Activity Tolerance Patient tolerated treatment well   Patient Left in bed;with call bell/phone within reach   Nurse Communication          Time: 1345-1404 OT Time Calculation (min): 19 min  Charges: OT General Charges $OT Visit: 1 Procedure OT Treatments $Therapeutic Exercise: 8-22 mins  [name] 02/11/2015, 2:45 PM  [phone]

## 2015-02-11 NOTE — Progress Notes (Signed)
CARDIAC REHAB PHASE I   PRE:  Rate/Rhythm: 85 SR    BP: sitting 94    SaO2:   MODE:  Ambulation: 350 ft   POST:  Rate/Rhythm: 124 ST    BP: sitting 100      SaO2:   Pt quieter today, sts he is tired from days many activities. Donned batteries with difficulty before walk. C/o pain in knees getting in and out of bed but not with walking. 350 ft with RW, assist x1. HR to 124 ST, BP elevated to 100 MAP. C/o back pain toward end, return to bed.  Has less difficulty changing from batteries to power cord (doesn't have to hold battery while plugging in). Will f/u. [id number]  Josephina Shih Kathleen CES, ACSM 02/11/2015 3:32 PM

## 2015-02-11 NOTE — Progress Notes (Signed)
Physical Therapy Treatment Patient Details Name: Andre Tran MRN: [id number] DOB: Nov 12, 1960 Today's Date: 04-Mar-2015    History of Present Illness Pt admitted after 2 falls at home  with left hip pain negative for fx. Pt with recurrent fevers of unknown etiology. 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    PT Comments    Pt making good progress with mobility.   Follow Up Recommendations  No PT follow up     Equipment Recommendations  None recommended by PT    Recommendations for Other Services       Precautions / Restrictions Precautions Precautions: Fall Precaution Comments: LVAD Restrictions Weight Bearing Restrictions: No    Mobility  Bed Mobility Overal bed mobility: Modified Independent                Transfers Overall transfer level: Modified independent Equipment used: 4-wheeled walker Transfers: Sit to/from Stand Sit to Stand: Modified independent (Device/Increase time)            Ambulation/Gait Ambulation/Gait assistance: Supervision Ambulation Distance (Feet): 375 Feet Assistive device: 4-wheeled walker Gait Pattern/deviations: Step-through pattern;Decreased stride length   Gait velocity interpretation: Below normal speed for age/gender General Gait Details: supervision for safety   Stairs            Wheelchair Mobility    Modified Rankin (Stroke Patients Only)       Balance Overall balance assessment: Needs assistance   Sitting balance-Leahy Scale: Good     Standing balance support: No upper extremity supported;During functional activity Standing balance-Leahy Scale: Fair                      Cognition Arousal/Alertness: Awake/alert Behavior During Therapy: Flat affect;WFL for tasks assessed/performed Overall Cognitive Status: No family/caregiver present to determine baseline cognitive functioning Area of Impairment: Problem solving             Problem Solving: Slow processing      Exercises      General Comments        Pertinent Vitals/Pain Pain Assessment: No/denies pain    Home Living                      Prior Function            PT Goals (current goals can now be found in the care plan section) Acute Rehab PT Goals Patient Stated Goal: return home Progress towards PT goals: Progressing toward goals    Frequency  Min 2X/week    PT Plan Current plan remains appropriate    Co-evaluation             End of Session Equipment Utilized During Treatment: Other (comment) (LVAD back-up bag) Activity Tolerance: Patient tolerated treatment well Patient left: in bed;with family/visitor present (sitting EOB)     Time: 5374-8270 PT Time Calculation (min) (ACUTE ONLY): 29 min  Charges:  $Gait Training: 8-22 mins                    G Codes:      Andre Tran Mar 04, 2015, 9:51 AM  [employer] PT [phone]

## 2015-02-11 NOTE — Progress Notes (Signed)
Speech Language Pathology Treatment: Cognitive-Linquistic  Patient Details Name: Andre Tran MRN: [id number] DOB: 05/02/1961 Today's Date: 02/11/2015 Time: 7841-2820 SLP Time Calculation (min) (ACUTE ONLY): 16 min  Assessment / Plan / Recommendation Clinical Impression  F/u after yesterday's cognitive-linguistic evaluation: per progress notes, cognition appears to be improving.  Expressive output is slow, deliberate but with good syntax, and no difficulty with word-retrieval this morning.  Pt provided basic history with good recall of names, able to discuss basics of his condition accurately.  Able to engage in selective attention with min cues; short-term recall min assist.  Will follow.    HPI Other Pertinent Information: Andre Tran is a 54 y.o. male with history of lymphoma and now with anemia and FUO and failure to thrive. ID following with no obvious signs of infection. Oncology has seen the patient and is requesting IR to perform bone marrow biopsy to rule out MDS/plasma cell disorder/Leukemia. He denies any chest pain. The patient has had multiple bone marrow biopsy procedures done in the past. He has previously tolerated sedation without complications.    Pertinent Vitals Pain Assessment: No/denies pain  SLP Plan  Continue with current plan of care    Recommendations                Follow up Recommendations:  (tbd) Plan: Continue with current plan of care    Aleeah Greeno L. Tivis Ringer, [place] CCC/SLP Pager [phone]      Andre Tran 02/11/2015, 11:17 AM

## 2015-02-11 NOTE — Progress Notes (Signed)
[name]    Date of Admission:  01/28/2015     Subjective: Spiked low grade fever overnight (99.5). Patient still notes right-sided knee pain. Denies headaches, tenderness in his left arm that was noted yesterday. Feels tired, not hungry.   Medications:  . amiodarone  200 mg Oral Daily  . amLODipine  2.5 mg Oral Daily  . apixaban  5 mg Oral BID  . atorvastatin  20 mg Oral QHS  . feeding supplement (GLUCERNA SHAKE)  237 mL Oral TID BM  . insulin aspart  0-5 Units Subcutaneous QHS  . insulin aspart  0-9 Units Subcutaneous TID WC  . insulin aspart  3 Units Subcutaneous TID WC  . insulin glargine  5 Units Subcutaneous QHS  . magnesium oxide  400 mg Oral Daily  . methocarbamol  500 mg Oral 4 times per day  . mirtazapine  7.5 mg Oral QHS  . pantoprazole  40 mg Oral Daily  . traMADol  50 mg Oral 4 times per day    Objective: Vital signs in last 24 hours: Temp:  [97.7 F (36.5 C)-99.5 F (37.5 C)] 98.6 F (37 C) (08/03 0746) Resp:  [17-20] 20 (08/03 0742) SpO2:  [100 %] 100 % (08/03 0742) Weight:  [143 lb 8.3 oz (65.1 kg)] 143 lb 8.3 oz (65.1 kg) (08/03 0343)  General: thin man sitting up in bed, NAD HEENT: [place]/AT, EOMI, sclera anicteric, pharynx non-erythematous with no exudates, mucus membranes moist CV: Regular rate, LVAD humming Pulm: CTA bilaterally, breaths non-labored  Abd: BS+, soft, non-tender, non-distended  Ext: Right knee appears swollen compared to left. Mild tenderness to palpation of anterior right knee. No erythema. No tenderness or fluctuance. No tenderness to palpation of other joints.  Neuro: alert and oriented x 3. Occasionally has difficulty finding words.  Skin: No rashes or bruising noted.    Lab Results  Recent Labs  02/09/15 0225 02/10/15 0310 02/11/15 0301  WBC 15.3* 13.1* 12.4*  HGB 9.8* 10.1* 9.6*  HCT 30.0* 30.0* 29.3*  NA 130* 130*  --   K 4.4 4.2  --   CL 96* 93*  --   CO2 26 26  --   BUN 18 21*  --     CREATININE 1.12 1.06  --    Liver Panel  Recent Labs  02/09/15 0225  PROT 7.2  ALBUMIN 3.1*  AST 80*  ALT 92*  ALKPHOS 138*  BILITOT 1.1    Microbiology: Blood cx 7/18>> negative Blood cx 7/22>> negative Urine cx 7/28 >> negative  Blood cx 8/2>>  Fungal blood cx 8/3>>   Studies/Results: Dg Knee 1-2 Views Right  02/10/2015   CLINICAL DATA:  Knee pain and swelling for 2 weeks after fall. Evaluate for effusion for possible tap.  EXAM: RIGHT KNEE - 1-2 VIEW  COMPARISON:  None.  FINDINGS: No fracture. No bone lesion. Knee joint spaces are well maintained. There are no significant arthropathic changes.  Minimal joint effusion is suggested in the suprapatellar portion of the joint capsule.  Soft tissues are unremarkable.  IMPRESSION: 1. Probable minimal joint effusion. 2. No fracture or arthropathic change.   Electronically Signed   By: Lajean Manes M.D.   On: 02/10/2015 17:37   CT Chest/Abd/Pelvis 7/20>> No acute processes.  CT Head 7/22>> chronic ischemic changes from prior infarct X-ray right foot 7/24>> Negative for abnormalities CT Head 7/26>> Chronic ischemic changes from prior infarct. No new hemorrhage. CT Chest/Abd/Pelvis 7/26>> ill-defined low density (  2cm) on lateral portion of midpole of left kidney, mild perinephritic stranding. Infarct vs infection.  Renal US 7/26>> Differential remains same as CT.    Assessment/Plan:  [name] is a 54 y.o. man with PMHx of Non-Hodgkin's and Hodgkin's lymphoma, chemo-induced cardiomyopathy with LVAD, CVA, type 2 DM, and spontaneous intracranial hemorrhage in May 2016 who initially presented after a syncopal episode that resulted in a fall and now with fever of unknown origin.   Fever of unknown origin:  Patient with another mild fever overnight. LDH continues to uptrend. His ferritin is elevated at 1205 and RF mildly elevated at 16.7. Repeat HIV negative. Uric acid, CK, and haptoglobin normal. CCP pending.   Although patient has  fever, arthralgias, and LFTs mildly elevated, would expect ferritin to be significantly increased (>3000) in Adult Still's disease. Patient also lacks other signs of rash, pharyngitis, hepatosplenomegaly, and lymphadenopathy. Likely his elevated ferritin and RF representing acute phase reactants.   I think we need to rule out a focal source of infection before starting steroid therapy. X-ray of the right knee showed a small effusion, but likely not large enough to sample. He does not have significant tenderness and no erythema present making this less likely the source of his fevers. Will pursue tagged WBC scan to look for source of infection. If this is negative then can consider starting steroids. Recommend to get rheumatology involved for their input.   I have also ordered acute hepatitis panel and fungal blood cultures. Will follow up CMV and EBV antibodies.   Attending note to follow.   Albin Felling, MD, MPH Internal Medicine Resident, PGY-II Pager: [phone] 02/11/2015, 9:26 AM

## 2015-02-12 ENCOUNTER — Inpatient Hospital Stay (HOSPITAL_COMMUNITY): Payer: Medicare Other

## 2015-02-12 DIAGNOSIS — T415X5D Adverse effect of therapeutic gases, subsequent encounter: Secondary | ICD-10-CM

## 2015-02-12 DIAGNOSIS — M0820 Juvenile rheumatoid arthritis with systemic onset, unspecified site: Secondary | ICD-10-CM | POA: Insufficient documentation

## 2015-02-12 LAB — CBC
HCT: 28 % — ABNORMAL LOW (ref 39.0–52.0)
Hemoglobin: 9.2 g/dL — ABNORMAL LOW (ref 13.0–17.0)
MCH: 28.1 pg (ref 26.0–34.0)
MCHC: 32.9 g/dL (ref 30.0–36.0)
MCV: 85.6 fL (ref 78.0–100.0)
Platelets: 386 10*3/uL (ref 150–400)
RBC: 3.27 MIL/uL — AB (ref 4.22–5.81)
RDW: 14.4 % (ref 11.5–15.5)
WBC: 14.9 10*3/uL — AB (ref 4.0–10.5)

## 2015-02-12 LAB — GLUCOSE, CAPILLARY
GLUCOSE-CAPILLARY: 94 mg/dL (ref 65–99)
GLUCOSE-CAPILLARY: 123 mg/dL — AB (ref 65–99)
Glucose-Capillary: 190 mg/dL — ABNORMAL HIGH (ref 65–99)
Glucose-Capillary: 153 mg/dL — ABNORMAL HIGH (ref 65–99)

## 2015-02-12 LAB — BASIC METABOLIC PANEL
Anion gap: 8 (ref 5–15)
BUN: 18 mg/dL (ref 6–20)
CO2: 26 mmol/L (ref 22–32)
CREATININE: 0.98 mg/dL (ref 0.61–1.24)
Calcium: 8.7 mg/dL — ABNORMAL LOW (ref 8.9–10.3)
Chloride: 97 mmol/L — ABNORMAL LOW (ref 101–111)
GFR calc Af Amer: >60 mL/min (ref >60)
GFR calc non Af Amer: >60 mL/min (ref >60)
Glucose, Bld: 175 mg/dL — ABNORMAL HIGH (ref 65–99)
Potassium: 4.6 mmol/L (ref 3.5–5.1)
Sodium: 131 mmol/L — ABNORMAL LOW (ref 135–145)

## 2015-02-12 LAB — EPSTEIN-BARR VIRUS VCA ANTIBODY PANEL
EBV Early Antigen Ab, IgG: <9 U/mL (ref 0.0–8.9)
EBV NA IGG: 518 U/mL — AB (ref 0.0–17.9)
EBV VCA IgG: 386 U/mL — ABNORMAL HIGH (ref 0.0–17.9)

## 2015-02-12 LAB — CMV ANTIBODY, IGG (EIA): CMV Ab - IgG: <0.6 U/mL (ref 0.00–0.59)

## 2015-02-12 LAB — HEPATITIS PANEL, ACUTE
HCV Ab: <0.1 s/co ratio (ref 0.0–0.9)
HEP B S AG: NEGATIVE
Hep A IgM: NEGATIVE
Hep B C IgM: NEGATIVE

## 2015-02-12 LAB — CMV IGM: CMV IgM: <30 AU/mL (ref 0.0–29.9)

## 2015-02-12 LAB — LACTATE DEHYDROGENASE: LDH: 436 U/L — ABNORMAL HIGH (ref 98–192)

## 2015-02-12 LAB — CHROMOSOME ANALYSIS, BONE MARROW

## 2015-02-12 NOTE — Progress Notes (Signed)
Patient ID: Andre Tran, male   DOB: 22-Feb-1961, 54 y.o.   MRN: [id number] HeartMate 2 Rounding Note  Subjective:    Admitted after fall and failure to thrive and paroxysmal fevers. Recent 30 pound weight loss and anorexia. ESR 108. CT C/A/P no evidence of recurrent lymphoma or abscess. LDH 304>270    TEE 7/23 no vegetations 7/25 VAD speed turned down to 8400  7/26 CT Abd Chest- Concern for mass L kidney.  7/27- Bone marrow aspirate.no evidence of clear-cut leukemia, lymphoma or MDS at this time 7/28 - Urology Consulted-- recommendations for biopsy. Urine culture sent  7/29 ESR 108 -> 65, Eliquis started.  8/1 WBC 15  8/2 T Max 101.7    Denies headache/hip pain. Denies SOB. Complaining of fatigue. Tmax 99.8.Marland Kitchen   ID following. => multiple serologies sent and knee film ordered. Only major abnormality is markedly elevated ferritin (1,200) and mildly elevated RF suggesting diffuse inflammatory process (acute-phase reactant)   Seen by ortho => no hip problem, nothing to do.   LDH 289 => 298=>337=>345 => 398=>436  LVAD INTERROGATION:  HeartMate II LVAD:  Flow 4.6 liters/min, speed 8400, power 4  PI 6.2   ~4  PI events/24 hrs   Objective:    Vital Signs:   Temp:  [98.5 F (36.9 C)-99.8 F (37.7 C)] 98.5 F (36.9 C) (08/04 0711) Pulse Rate:  [85-91] 85 (08/04 0711) Resp:  [20-24] 22 (08/04 0711) SpO2:  [96 %-100 %] 96 % (08/04 0711) Weight:  [130 lb 6.4 oz (59.149 kg)] 130 lb 6.4 oz (59.149 kg) (08/04 0500) Last BM Date: 01/29/15 Mean arterial Pressure 80s   Intake/Output:   Intake/Output Summary (Last 24 hours) at 02/12/15 0729 Last data filed at 02/12/15 0600  Gross per 24 hour  Intake    240 ml  Output    100 ml  Net    140 ml     Physical Exam: General: Lying in bed.  Fatigued appearing.  HEENT: normal Neck: supple. JVP 5-6  Carotids 2+ bilat; no bruits. No lymphadenopathy or thryomegaly appreciated. Cor: Mechanical heart sounds with LVAD hum present. Lungs:  clear Abdomen: soft, nontender, nondistended. No hepatosplenomegaly. No bruits or masses. Good bowel sounds. Driveline: C/D/I; securement device intact and driveline incorporated Extremities: no cyanosis, clubbing, rash, edema  Neuro: alert & orientedx3, cranial nerves grossly intact. moves all 4 extremities w/o difficulty. Affect flat  Telemetry: SR 80s   Labs: Basic Metabolic Panel:  Recent Labs Lab 02/08/15 0222 02/09/15 0225 02/10/15 0310 02/11/15 0301 02/12/15 0218  NA 130* 130* 130* 132* 131*  K 4.6 4.4 4.2 4.5 4.6  CL 94* 96* 93* 94* 97*  CO2 $Re'26 26 26 24 26  'FbI$ GLUCOSE 186* 146* 159* 135* 175*  BUN 18 18 21* 22* 18  CREATININE 1.03 1.12 1.06 1.11 0.98  CALCIUM 9.0 9.1 9.0 9.3 8.7*    Liver Function Tests:  Recent Labs Lab 02/08/15 0222 02/09/15 0225  AST 70* 80*  ALT 96* 92*  ALKPHOS 143* 138*  BILITOT 0.8 1.1  PROT 7.1 7.2  ALBUMIN 2.9* 3.1*   No results for input(s): LIPASE, AMYLASE in the last 168 hours. No results for input(s): AMMONIA in the last 168 hours.  CBC:  Recent Labs Lab 02/08/15 0222 02/09/15 0225 02/10/15 0310 02/11/15 0301 02/12/15 0218  WBC 12.7* 15.3* 13.1* 12.4* 14.9*  HGB 10.0* 9.8* 10.1* 9.6* 9.2*  HCT 30.3* 30.0* 30.0* 29.3* 28.0*  MCV 85.4 87.0 85.0 85.9 85.6  PLT 378 347  351 374 386    INR:  Recent Labs Lab 02/06/15 0316 02/07/15 0232  INR 1.48 1.71*    Other results:    Imaging: Dg Knee 1-2 Views Right  02/10/2015   CLINICAL DATA:  Knee pain and swelling for 2 weeks after fall. Evaluate for effusion for possible tap.  EXAM: RIGHT KNEE - 1-2 VIEW  COMPARISON:  None.  FINDINGS: No fracture. No bone lesion. Knee joint spaces are well maintained. There are no significant arthropathic changes.  Minimal joint effusion is suggested in the suprapatellar portion of the joint capsule.  Soft tissues are unremarkable.  IMPRESSION: 1. Probable minimal joint effusion. 2. No fracture or arthropathic change.   Electronically  Signed   By: Lajean Manes M.D.   On: 02/10/2015 17:37     Medications:     Scheduled Medications: . amiodarone  200 mg Oral Daily  . amLODipine  2.5 mg Oral Daily  . apixaban  5 mg Oral BID  . atorvastatin  20 mg Oral QHS  . feeding supplement (GLUCERNA SHAKE)  237 mL Oral TID BM  . insulin aspart  0-5 Units Subcutaneous QHS  . insulin aspart  0-9 Units Subcutaneous TID WC  . insulin aspart  3 Units Subcutaneous TID WC  . insulin glargine  5 Units Subcutaneous QHS  . magnesium oxide  400 mg Oral Daily  . methocarbamol  500 mg Oral 4 times per day  . mirtazapine  7.5 mg Oral QHS  . pantoprazole  40 mg Oral Daily  . traMADol  50 mg Oral 4 times per day    Infusions:    PRN Medications: acetaminophen, albuterol, bisacodyl, fentaNYL (SUBLIMAZE) injection, midazolam   Assessment:   1. Fever and failure to thrive 2. Chronic systolic HF - Status post LVAD implantation for DT.  - 7/22 Speed turned down to 8600.  - 7/24 speed turned down to 8400 3. H/o Non Hodgkins Lymphoma s/p therapy- CT chest/abd/pelvis ok.  4.  H/O CVA 2008  5. H/O VT/VF --St Jude ICD 2010 - Interrogated device. No arrhythmias noted.   6. DMII- not on any meds. Uncontrolled DM.  7. PSVT- Quiescent on amiodarone 200 mg daily. 8. Recent ICH 5/16.  -stable no new neuro deficits. Head CT - no recurrence 9. Hyponatremia Na 127  10. Severe malnutrition in context of chronic illness 11. R heel pain--Xray ok .  12. Headache- Resolved.  13. L hip pain- Ortho evaluated, do not think there is a problem.  Resolved.  14. LFT elevation- mild   Per Oncology Bone  Marrow biopsy-->no evidence of clear-cut leukemia, lymphoma or MDS at this times  ID following. Anticipate starting steroids once infection rule out.   I reviewed the LVAD parameters from today, and compared the results to the patient's prior recorded data.  No programming changes were made.  The LVAD is functioning within specified  parameters.  The patient performs LVAD self-test daily.  LVAD interrogation was negative for any significant power changes, alarms or PI events/speed drops.  LVAD equipment check completed and is in good working order.  Back-up equipment present.   LVAD education done on emergency procedures and precautions and reviewed exit site care.  Length of Stay: 15  CLEGG,AMY NP-C  02/12/2015, 7:29 AM  VAD Team --- VAD ISSUES ONLY--- Pager [phone] (7am - 7am)  Advanced Heart Failure Team  Pager [phone] (M-F; 7a - 4p)  Please contact [facility] for night-coverage after hours (4p -7a ) and weekends on  [email or web address]  Patient seen and examined with Darrick Grinder, NP. We discussed all aspects of the encounter. I agree with the assessment and plan as stated above.   Discussed with Rheum and ID. All cultures remain negative. 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 Will have tagged WBC scan today. If negative start prednisone $RemoveBeforeDE'20mg'[id number]$  daily. He continues to get weaker. Hopefully he will turn around with steroids. Continue PT.   VAD parameters stable.   Gaven Eugene,MD 7:49 AM

## 2015-02-12 NOTE — Progress Notes (Signed)
Inpatient Diabetes Program Recommendations  AACE/ADA: New Consensus Statement on Inpatient Glycemic Control (2013)  Target Ranges:  Prepandial:   less than 140 mg/dL      Peak postprandial:   less than 180 mg/dL (1-2 hours)      Critically ill patients:  140 - 180 mg/dL   Results for WILBON, OBENCHAIN (MRN [id number]) as of 02/12/2015 09:39  Ref. Range 02/11/2015 07:46 02/11/2015 11:37 02/11/2015 16:38 02/11/2015 21:20 02/12/2015 07:15  Glucose-Capillary Latest Ref Range: 65-99 mg/dL 214 (H) 239 (H) 200 (H) 93 190 (H)   Diabetes history: DM 2 Outpatient Diabetes medications: Lantus 5 units QHS Current orders for Inpatient glycemic control: Lantus 5 units QHS, Novolog Sensitive + HS + Novolog 3 units meal coverage  Inpatient Diabetes Program Recommendations Insulin - Basal: Glucose in the 200's yesterday, Fasting still above 180 mg/dl. Please consider increasing basal from 5 to Lantus 7 units QHS. Diet: Please change diet to carb modified.   Thanks,  Tama Headings RN, MSN, [facility] Inpatient Diabetes Coordinator Team Pager [phone]

## 2015-02-12 NOTE — Progress Notes (Signed)
[facility]    Date of Admission:  01/28/2015     Subjective: Low grade fever overnight (99.8). Patient did not complain of right-sided knee pain, but when I palpated it he was tender. He states the pain is on the lateral side.    Medications:  . amiodarone  200 mg Oral Daily  . amLODipine  2.5 mg Oral Daily  . apixaban  5 mg Oral BID  . atorvastatin  20 mg Oral QHS  . feeding supplement (GLUCERNA SHAKE)  237 mL Oral TID BM  . insulin aspart  0-5 Units Subcutaneous QHS  . insulin aspart  0-9 Units Subcutaneous TID WC  . insulin aspart  3 Units Subcutaneous TID WC  . insulin glargine  5 Units Subcutaneous QHS  . magnesium oxide  400 mg Oral Daily  . methocarbamol  500 mg Oral 4 times per day  . mirtazapine  7.5 mg Oral QHS  . pantoprazole  40 mg Oral Daily  . traMADol  50 mg Oral 4 times per day    Objective: Vital signs in last 24 hours: Temp:  [98.5 F (36.9 C)-99.8 F (37.7 C)] 98.5 F (36.9 C) (08/04 0711) Pulse Rate:  [85-91] 85 (08/04 0711) Resp:  [20-24] 22 (08/04 0711) SpO2:  [96 %-100 %] 96 % (08/04 0711) Weight:  [130 lb 6.4 oz (59.149 kg)] 130 lb 6.4 oz (59.149 kg) (08/04 0500)  General: thin man sitting up in bed, NAD HEENT: /AT, EOMI, sclera anicteric, pharynx non-erythematous with no exudates, mucus membranes moist CV: Regular rate, LVAD humming Pulm: CTA bilaterally, breaths non-labored  Abd: BS+, soft, non-tender, non-distended  Ext: Right knee appears swollen compared to left. He seems more tender on his right knee than previously. No erythema. No tenderness or fluctuance. No tenderness to palpation of other joints.  Neuro: alert and oriented x 3. Occasionally has difficulty finding words.  Skin: No rashes or bruising noted.    Lab Results  Recent Labs  02/11/15 0301 02/12/15 0218  WBC 12.4* 14.9*  HGB 9.6* 9.2*  HCT 29.3* 28.0*  NA 132* 131*  K 4.5 4.6  CL 94* 97*  CO2 24 26  BUN 22* 18  CREATININE 1.11  0.98   Liver Panel No results for input(s): PROT, ALBUMIN, AST, ALT, ALKPHOS, BILITOT, BILIDIR, IBILI in the last 72 hours.  Microbiology: Blood cx 7/18>> negative Blood cx 7/22>> negative Urine cx 7/28 >> negative  Blood cx 8/2>>  Fungal blood cx 8/3>>   Studies/Results: Dg Knee 1-2 Views Right  02/10/2015   CLINICAL DATA:  Knee pain and swelling for 2 weeks after fall. Evaluate for effusion for possible tap.  EXAM: RIGHT KNEE - 1-2 VIEW  COMPARISON:  None.  FINDINGS: No fracture. No bone lesion. Knee joint spaces are well maintained. There are no significant arthropathic changes.  Minimal joint effusion is suggested in the suprapatellar portion of the joint capsule.  Soft tissues are unremarkable.  IMPRESSION: 1. Probable minimal joint effusion. 2. No fracture or arthropathic change.   Electronically Signed   By: Amie Portland M.D.   On: 02/10/2015 17:37   CT Chest/Abd/Pelvis 7/20>> No acute processes.  CT Head 7/22>> chronic ischemic changes from prior infarct X-ray right foot 7/24>> Negative for abnormalities CT Head 7/26>> Chronic ischemic changes from prior infarct. No new hemorrhage. CT Chest/Abd/Pelvis 7/26>> ill-defined low density (2cm) on lateral portion of midpole of left kidney, mild perinephritic stranding. Infarct vs infection.  Renal US 7/26>>  Differential remains same as CT.  Tagged WBC Scan 8/4>>    Assessment/Plan:  Mr. Weekley is a 54 y.o. man with PMHx of Non-Hodgkin's and Hodgkin's lymphoma, chemo-induced cardiomyopathy with LVAD, CVA, type 2 DM, and spontaneous intracranial hemorrhage in May 2016 who initially presented after a syncopal episode that resulted in a fall and now with fever of unknown origin.   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 Acute hepatitis panel negative. Patient not immunized for Hep A or Hep B. CMV antibodies negative, EBV antibodies pending. Doubt EBV. Fungal cultures  pending. All other cultures have been negative.   After my discussion with Dr. 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 His right knee is tender to palpation on exam, more than previously. This could go along with Still's disease as it can cause inflamed joints. Will need to be cautious though as if he has an infection then steroids will exacerbate it.   Plan is to get tagged WBC scan today. If negative, then can start patient on Prednisone 20 mg daily. If positive (which may be possible if his right knee is inflamed) we may need to consider aspiration of the knee. Would like to be extra careful given his LVAD. If steroids are started then Dr. Amil Amen recommended for patient to have extended course of steroids (3-4 weeks) if responding well and to have a follow up appointment with him in the next 3-4 weeks.   Attending note to follow.   Albin Felling, MD, MPH Internal Medicine Resident, PGY-II Pager: [phone] 02/12/2015, 8:42 AM

## 2015-02-12 NOTE — Progress Notes (Signed)
Pt declined ambulation today. Sts he feels very tired and his knees are very painful today just moving them in the bed. Assisted pt getting comfortable in bed. Will f/u tomorrow. Yves Dill CES, ACSM 2:55 PM 02/12/2015

## 2015-02-12 NOTE — Care Management Important Message (Signed)
Important Message  Patient Details  Name: Andre Tran MRN: [id number] Date of Birth: 1961-07-02   Medicare Important Message Given:  Yes-second notification given    Pricilla Handler 02/12/2015, 12:27 PM

## 2015-02-13 ENCOUNTER — Ambulatory Visit (HOSPITAL_COMMUNITY): Payer: Medicare Other

## 2015-02-13 DIAGNOSIS — R531 Weakness: Secondary | ICD-10-CM | POA: Insufficient documentation

## 2015-02-13 DIAGNOSIS — M0820 Juvenile rheumatoid arthritis with systemic onset, unspecified site: Secondary | ICD-10-CM

## 2015-02-13 DIAGNOSIS — M25469 Effusion, unspecified knee: Secondary | ICD-10-CM | POA: Insufficient documentation

## 2015-02-13 DIAGNOSIS — M25561 Pain in right knee: Secondary | ICD-10-CM

## 2015-02-13 LAB — GLUCOSE, CAPILLARY
GLUCOSE-CAPILLARY: 150 mg/dL — AB (ref 65–99)
Glucose-Capillary: 157 mg/dL — ABNORMAL HIGH (ref 65–99)
Glucose-Capillary: 153 mg/dL — ABNORMAL HIGH (ref 65–99)
Glucose-Capillary: 132 mg/dL — ABNORMAL HIGH (ref 65–99)

## 2015-02-13 LAB — BASIC METABOLIC PANEL
Anion gap: 10 (ref 5–15)
BUN: 19 mg/dL (ref 6–20)
CHLORIDE: 98 mmol/L — AB (ref 101–111)
CO2: 24 mmol/L (ref 22–32)
Calcium: 9 mg/dL (ref 8.9–10.3)
Creatinine, Ser: 1.1 mg/dL (ref 0.61–1.24)
GFR calc Af Amer: >60 mL/min (ref >60)
GFR calc non Af Amer: >60 mL/min (ref >60)
GLUCOSE: 132 mg/dL — AB (ref 65–99)
POTASSIUM: 4.4 mmol/L (ref 3.5–5.1)
SODIUM: 132 mmol/L — AB (ref 135–145)

## 2015-02-13 LAB — CBC
HCT: 28 % — ABNORMAL LOW (ref 39.0–52.0)
HEMOGLOBIN: 9.3 g/dL — AB (ref 13.0–17.0)
MCH: 28.5 pg (ref 26.0–34.0)
MCHC: 33.2 g/dL (ref 30.0–36.0)
MCV: 85.9 fL (ref 78.0–100.0)
Platelets: 356 10*3/uL (ref 150–400)
RBC: 3.26 MIL/uL — AB (ref 4.22–5.81)
RDW: 14.6 % (ref 11.5–15.5)
WBC: 15.6 10*3/uL — AB (ref 4.0–10.5)

## 2015-02-13 LAB — LACTATE DEHYDROGENASE: LDH: 324 U/L — ABNORMAL HIGH (ref 98–192)

## 2015-02-13 MED ORDER — LIDOCAINE HCL (PF) 1 % IJ SOLN
30.0000 mL | Freq: Once | INTRAMUSCULAR | Status: DC
  Filled 2015-02-13: qty 30

## 2015-02-13 MED ORDER — LIDOCAINE HCL (PF) 1 % IJ SOLN
INTRAMUSCULAR | Status: AC
  Filled 2015-02-13: qty 5

## 2015-02-13 NOTE — Progress Notes (Addendum)
Patient ID: Andre Tran, male   DOB: 03/17/1961, 54 y.o.   MRN: [id number] HeartMate 2 Rounding Note  Subjective:    Admitted after fall and failure to thrive and paroxysmal fevers. Recent 30 pound weight loss and anorexia. ESR 108. CT C/A/P no evidence of recurrent lymphoma or abscess. LDH 304>270    TEE 7/23 no vegetations 7/25 VAD speed turned down to 8400  7/26 CT Abd Chest- Concern for mass L kidney.  7/27- Bone marrow aspirate.no evidence of clear-cut leukemia, lymphoma or MDS at this time 7/28 - Urology Consulted-- recommendations for biopsy. Urine culture sent  7/29 ESR 108 -> 65, Eliquis started.  8/1 WBC 15  8/2 T Max 101.7  8/4 Afebrile  Tag WBC pending. Complaining of fatigue and R knee pain.   ID following. => multiple serologies sent and knee film ordered. Only major abnormality is markedly elevated ferritin (1,200) and mildly elevated RF suggesting diffuse inflammatory process (acute-phase reactant)   Seen by ortho => no hip problem, nothing to do.   LDH 289 => 298=>337=>345 => 398=>436=>324  LVAD INTERROGATION:  HeartMate II LVAD:  Flow 4.7 liters/min, speed 8400, power 5  PI 6.1   ~4  PI events/24 hrs   Objective:    Vital Signs:   Temp:  [97.8 F (36.6 C)-100.7 F (38.2 C)] 98.1 F (36.7 C) (08/05 0437) Pulse Rate:  [81-84] 84 (08/04 1527) Resp:  [13-23] 19 (08/05 0600) BP: (88-92)/(0) 92/0 mmHg (08/04 1600) SpO2:  [94 %-98 %] 98 % (08/05 0437) Weight:  [128 lb 14.4 oz (58.469 kg)] 128 lb 14.4 oz (58.469 kg) (08/05 0457) Last BM Date: 01/29/15 Mean arterial Pressure 80-90s    Intake/Output:   Intake/Output Summary (Last 24 hours) at 02/13/15 0712 Last data filed at 02/13/15 0600  Gross per 24 hour  Intake    590 ml  Output    550 ml  Net     40 ml     Physical Exam: General: Lying in bed.  Fatigued appearing.  HEENT: normal Neck: supple. JVP 5-6  Carotids 2+ bilat; no bruits. No lymphadenopathy or thryomegaly appreciated. Cor: Mechanical heart  sounds with LVAD hum present. Lungs: clear Abdomen: soft, nontender, nondistended. No hepatosplenomegaly. No bruits or masses. Good bowel sounds. Driveline: C/D/I; securement device intact and driveline incorporated Extremities: no cyanosis, clubbing, rash, edema  R knee painful to touch Neuro: alert & orientedx3, cranial nerves grossly intact. moves all 4 extremities w/o difficulty. Affect flat  Telemetry: SR 80s   Labs: Basic Metabolic Panel:  Recent Labs Lab 02/09/15 0225 02/10/15 0310 02/11/15 0301 02/12/15 0218 02/13/15 0612  NA 130* 130* 132* 131* 132*  K 4.4 4.2 4.5 4.6 4.4  CL 96* 93* 94* 97* 98*  CO2 $Re'26 26 24 26 24  'rSA$ GLUCOSE 146* 159* 135* 175* 132*  BUN 18 21* 22* 18 19  CREATININE 1.12 1.06 1.11 0.98 1.10  CALCIUM 9.1 9.0 9.3 8.7* 9.0    Liver Function Tests:  Recent Labs Lab 02/08/15 0222 02/09/15 0225  AST 70* 80*  ALT 96* 92*  ALKPHOS 143* 138*  BILITOT 0.8 1.1  PROT 7.1 7.2  ALBUMIN 2.9* 3.1*   No results for input(s): LIPASE, AMYLASE in the last 168 hours. No results for input(s): AMMONIA in the last 168 hours.  CBC:  Recent Labs Lab 02/09/15 0225 02/10/15 0310 02/11/15 0301 02/12/15 0218 02/13/15 0612  WBC 15.3* 13.1* 12.4* 14.9* 15.6*  HGB 9.8* 10.1* 9.6* 9.2* 9.3*  HCT 30.0* 30.0*  29.3* 28.0* 28.0*  MCV 87.0 85.0 85.9 85.6 85.9  PLT 347 351 374 386 356    INR:  Recent Labs Lab 02/07/15 0232  INR 1.71*    Other results:    Imaging: No results found.   Medications:     Scheduled Medications: . amiodarone  200 mg Oral Daily  . amLODipine  2.5 mg Oral Daily  . apixaban  5 mg Oral BID  . atorvastatin  20 mg Oral QHS  . feeding supplement (GLUCERNA SHAKE)  237 mL Oral TID BM  . insulin aspart  0-5 Units Subcutaneous QHS  . insulin aspart  0-9 Units Subcutaneous TID WC  . insulin aspart  3 Units Subcutaneous TID WC  . insulin glargine  5 Units Subcutaneous QHS  . magnesium oxide  400 mg Oral Daily  . methocarbamol   500 mg Oral 4 times per day  . mirtazapine  7.5 mg Oral QHS  . pantoprazole  40 mg Oral Daily  . traMADol  50 mg Oral 4 times per day    Infusions:    PRN Medications: acetaminophen, albuterol, bisacodyl, fentaNYL (SUBLIMAZE) injection, midazolam   Assessment:   1. Fever and failure to thrive 2. Chronic systolic HF - Status post LVAD implantation for DT.  - 7/22 Speed turned down to 8600.  - 7/24 speed turned down to 8400 3. H/o Non Hodgkins Lymphoma s/p therapy- CT chest/abd/pelvis ok.  4.  H/O CVA 2008  5. H/O VT/VF --St Jude ICD 2010 - Interrogated device. No arrhythmias noted.   6. DMII- not on any meds. Uncontrolled DM.  7. PSVT- Quiescent on amiodarone 200 mg daily. 8. Recent ICH 5/16.  -stable no new neuro deficits. Head CT - no recurrence 9. Hyponatremia Na 127  10. Severe malnutrition in context of chronic illness 11. R heel pain--Xray ok .  12. Headache- Resolved.  13. L hip pain- Ortho evaluated, do not think there is a problem.  Resolved.  14. LFT elevation- mild  Per Oncology Bone  Marrow biopsy-->no evidence of clear-cut leukemia, lymphoma or MDS at this times  ID following. Anticipate starting steroids once infection rule out.   Tag WBC pending if negative --> steroids.   I reviewed the LVAD parameters from today, and compared the results to the patient's prior recorded data.  No programming changes were made.  The LVAD is functioning within specified parameters.  The patient performs LVAD self-test daily.  LVAD interrogation was negative for any significant power changes, alarms or PI events/speed drops.  LVAD equipment check completed and is in good working order.  Back-up equipment present.   LVAD education done on emergency procedures and precautions and reviewed exit site care.  Length of Stay: 38  CLEGG,AMY NP-C  02/13/2015, 7:12 AM  VAD Team --- VAD ISSUES ONLY--- Pager [phone] (7am - 7am)  Advanced Heart Failure Team  Pager [phone]  (M-F; 7a - 4p)  Please contact [facility] for night-coverage after hours (4p -7a ) and weekends on [email or web address]  Patient seen with NP, agree with the above note.  Periodic fevers, none today.  Complaints of left knee pain.  LVAD parameters stable.  Concern now for Still's disease.  Once infection ruled out, would treat with steroids.  He is getting tagged WBC scan.  Ortho to evaluate his left knee today, ?aspiration.  Appreciate help from ID.   He is on apixaban for anticoagulation as discussed in prior notes.   Loralie Champagne 02/13/2015 2:19 PM

## 2015-02-13 NOTE — Consult Note (Signed)
Reason for Consult: Right knee pain and effusion Referring Physician: Dr. Arcelia Jew, Infectious Disease  Andre Tran is an 54 y.o. male.  HPI: Andre Tran is a 54 year old male who was admitted to [facility] back on 7/20 following a fall, failure to thrive, poor appetite. He has a very extensive medical history including chronic systolic heart failure, non Hodgkins Lymphoma, history of CVA and right sided weakness, history of VT/VF and and ICD implant, DM, PSVT, and an LVAD placement in June 2016. He developed a fever of unknown origin and has been undergoing workup with Infectious Disease.  He also developed knee pain and had x-rays which were read as "probable minimal joint effusion".  The pain in the right knee increased recently and due to the elevated fevers, knee pain, and possible effusion, Dr Wynelle Link was consulted to evaluate and aspirate the knee to obtain fluid for testing if possible source of infection. He reports a few falls in the past 3 weeks and has been relatively immobile since. His knee pain has been associated with this immobility and his left knee hurts also. He is not having rest pain and the knee has not been swelling on him  Past Medical History  Diagnosis Date  . Paroxysmal ventricular tachycardia 2005, 2010    s/p AICD '05, replaced w/ St. Jude's in 2010 (PVT/V.Fib arrest requiring ICD implant in 2005)  . HYPERTENSION, UNSPECIFIED   . HYPERLIPIDEMIA-MIXED   . 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 Nonischemic cardiomyopathy     2/2 Adriamycin administration for lymphoma  . CHF (congestive heart failure)     EF 15% by echo 2009; s/p St. Jude ICD  . Diabetes mellitus     Type 2  . Cancer 1992, 2007     non hodkins lymphoma 1992, Hodgkins 2007  . Depression   . LV (left ventricular) mural thrombus 2009    2009, on coumadin  . Small bowel obstruction 2001    s/p  Small bowel resection 2001  . Jejunal intussusception 2008    2008  . 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 Splenic mass 2009    Noted on Abd [place] 2009 w/ recs for f/u CT - not done  . Hypotension   . Noncompliance with medication regimen   . AICD (automatic cardioverter/defibrillator) present 2005, 2010  . Presence of permanent cardiac pacemaker   . Renal insufficiency     Past Surgical History  Procedure Laterality Date  . Cardiac defibrillator placement      2005, replaced w/ St. Jude's 2010  . Vasectomy    . Bowel resection      small bowell 2/2 obstruction 2001  . Pacemaker insertion    . Insert / replace / remove pacemaker    . Colonoscopy N/A 09/18/2014    Procedure: COLONOSCOPY;  Surgeon: [name], MD;  Location: [facility] ENDOSCOPY;  Service: Endoscopy;  Laterality: N/A;  . Esophagogastroduodenoscopy N/A 09/18/2014    Procedure: ESOPHAGOGASTRODUODENOSCOPY (EGD);  Surgeon: [name], MD;  Location: [facility] ENDOSCOPY;  Service: Endoscopy;  Laterality: N/A;  . Left and right heart catheterization with coronary angiogram N/A 09/19/2014    Procedure: LEFT AND RIGHT HEART CATHETERIZATION WITH CORONARY ANGIOGRAM;  Surgeon: Jolaine Artist, MD;  Location: [facility] CATH LAB;  Service: Cardiovascular;  Laterality: N/A;  . Colonoscopy  N/A 09/19/2014    Procedure: COLONOSCOPY;  Surgeon: [name], MD;  Location: [place];  Service: Gastroenterology;  Laterality: N/A;  . Insertion of implantable left ventricular assist device N/A 10/13/2014    Procedure: INSERTION OF IMPLANTABLE LEFT VENTRICULAR ASSIST DEVICE;  Surgeon: [name], MD;  Location: [place];  Service: Open Heart Surgery;  Laterality: N/A;  CIRC ARREST  NITRIC OXIDE  . Tee without cardioversion N/A 10/13/2014    Procedure: TRANSESOPHAGEAL ECHOCARDIOGRAM (TEE);  Surgeon: [name], MD;  Location: [place];  Service: Open Heart Surgery;  Laterality: N/A;  . Exploration post operative open  heart N/A 10/13/2014    Procedure: EXPLORATION POST OPERATIVE OPEN HEART;  Surgeon: [name], MD;  Location: [place] OR;  Service: Open Heart Surgery;  Laterality: N/A;  . Cardioversion N/A 01/30/2015    Procedure: CARDIOVERSION;  Surgeon: Jolaine Artist, MD;  Location: [facility] OR;  Service: Cardiovascular;  Laterality: N/A;    Family History  Problem Relation Age of Onset  . Other      No known family h/o heart disease  . Heart disease    . Diabetes Mother   . Other Other     complications from hip replacement    Social History:  reports that he has never smoked. He has never used smokeless tobacco. He reports that he drinks about 1.2 oz of alcohol per week. He reports that he does not use illicit drugs.  Allergies:  Allergies  Allergen Reactions  . Hydralazine Other (See Comments)    Severe headaches     Medications: I have reviewed the patient's current medications.  Results for orders placed or performed during the hospital encounter of 01/28/15 (from the past 48 hour(s))  Glucose, capillary     Status: None   Collection Time: 02/11/15  9:20 PM  Result Value Ref Range   Glucose-Capillary 93 65 - 99 mg/dL   Comment 1 Capillary Specimen   Lactate dehydrogenase     Status: Abnormal   Collection Time: 02/12/15  2:18 AM  Result Value Ref Range   LDH 436 (H) 98 - 192 U/L  Basic metabolic panel     Status: Abnormal   Collection Time: 02/12/15  2:18 AM  Result Value Ref Range   Sodium 131 (L) 135 - 145 mmol/L   Potassium 4.6 3.5 - 5.1 mmol/L   Chloride 97 (L) 101 - 111 mmol/L   CO2 26 22 - 32 mmol/L   Glucose, Bld 175 (H) 65 - 99 mg/dL   BUN 18 6 - 20 mg/dL   Creatinine, Ser 0.98 0.61 - 1.24 mg/dL   Calcium 8.7 (L) 8.9 - 10.3 mg/dL   GFR calc non Af Amer >60 >60 mL/min   GFR calc Af Amer >60 >60 mL/min    Comment: (NOTE) The eGFR has been calculated using the CKD EPI equation. This calculation has not been validated in all clinical situations. eGFR's persistently <60  mL/min signify possible Chronic Kidney Disease.    Anion gap 8 5 - 15  CBC     Status: Abnormal   Collection Time: 02/12/15  2:18 AM  Result Value Ref Range   WBC 14.9 (H) 4.0 - 10.5 K/uL   RBC 3.27 (L) 4.22 - 5.81 MIL/uL   Hemoglobin 9.2 (L) 13.0 - 17.0 g/dL   HCT 28.0 (L) 39.0 - 52.0 %   MCV 85.6 78.0 - 100.0 fL   MCH 28.1 26.0 - 34.0 pg   MCHC 32.9  30.0 - 36.0 g/dL   RDW 14.4 11.5 - 15.5 %   Platelets 386 150 - 400 K/uL  Glucose, capillary     Status: Abnormal   Collection Time: 02/12/15  7:15 AM  Result Value Ref Range   Glucose-Capillary 190 (H) 65 - 99 mg/dL   Comment 1 Capillary Specimen   Glucose, capillary     Status: Abnormal   Collection Time: 02/12/15 12:23 PM  Result Value Ref Range   Glucose-Capillary 153 (H) 65 - 99 mg/dL   Comment 1 Capillary Specimen   Glucose, capillary     Status: None   Collection Time: 02/12/15  4:54 PM  Result Value Ref Range   Glucose-Capillary 94 65 - 99 mg/dL   Comment 1 Capillary Specimen   Glucose, capillary     Status: Abnormal   Collection Time: 02/12/15  9:18 PM  Result Value Ref Range   Glucose-Capillary 123 (H) 65 - 99 mg/dL   Comment 1 Capillary Specimen   Lactate dehydrogenase     Status: Abnormal   Collection Time: 02/13/15  6:12 AM  Result Value Ref Range   LDH 324 (H) 98 - 192 U/L  Basic metabolic panel     Status: Abnormal   Collection Time: 02/13/15  6:12 AM  Result Value Ref Range   Sodium 132 (L) 135 - 145 mmol/L   Potassium 4.4 3.5 - 5.1 mmol/L   Chloride 98 (L) 101 - 111 mmol/L   CO2 24 22 - 32 mmol/L   Glucose, Bld 132 (H) 65 - 99 mg/dL   BUN 19 6 - 20 mg/dL   Creatinine, Ser 1.10 0.61 - 1.24 mg/dL   Calcium 9.0 8.9 - 10.3 mg/dL   GFR calc non Af Amer >60 >60 mL/min   GFR calc Af Amer >60 >60 mL/min    Comment: (NOTE) The eGFR has been calculated using the CKD EPI equation. This calculation has not been validated in all clinical situations. eGFR's persistently <60 mL/min signify possible Chronic  Kidney Disease.    Anion gap 10 5 - 15  CBC     Status: Abnormal   Collection Time: 02/13/15  6:12 AM  Result Value Ref Range   WBC 15.6 (H) 4.0 - 10.5 K/uL   RBC 3.26 (L) 4.22 - 5.81 MIL/uL   Hemoglobin 9.3 (L) 13.0 - 17.0 g/dL   HCT 28.0 (L) 39.0 - 52.0 %   MCV 85.9 78.0 - 100.0 fL   MCH 28.5 26.0 - 34.0 pg   MCHC 33.2 30.0 - 36.0 g/dL   RDW 14.6 11.5 - 15.5 %   Platelets 356 150 - 400 K/uL  Glucose, capillary     Status: Abnormal   Collection Time: 02/13/15  8:03 AM  Result Value Ref Range   Glucose-Capillary 157 (H) 65 - 99 mg/dL   Comment 1 Capillary Specimen   Glucose, capillary     Status: Abnormal   Collection Time: 02/13/15 11:53 AM  Result Value Ref Range   Glucose-Capillary 153 (H) 65 - 99 mg/dL   Comment 1 Capillary Specimen   Glucose, capillary     Status: Abnormal   Collection Time: 02/13/15  4:32 PM  Result Value Ref Range   Glucose-Capillary 132 (H) 65 - 99 mg/dL   Comment 1 Capillary Specimen     Nm Wbc Scan Tumor  02/13/2015   CLINICAL DATA:  Fever of unknown origin. RIGHT knee swelling for several days.  EXAM: NUCLEAR MEDICINE LEUKOCYTE SCAN  TECHNIQUE: Following intravenous  administration of radiolabeled white blood cells, images of the head, neck, trunk, and extremities were obtained on subsequent days.  RADIOPHARMACEUTICALS:  Six hundred uCi Indium-111 labeled autologous leukocytes IV  COMPARISON:  CT 02/03/2015  FINDINGS: There is no abnormal accumulation of white blood cells within the chest, abdomen, or pelvis. No abnormal accumulation within extremities. Physiologic uptake noted the marrow space, liver and spleen.  IMPRESSION: No abnormal accumulation of labeled white blood cells to localize infection.   Electronically Signed   By: Suzy Bouchard M.D.   On: 02/13/2015 16:55    ROS Blood pressure 92/0, pulse 92, temperature 99.8 F (37.7 C), temperature source Oral, resp. rate 23, height 5' 7" (1.702 m), weight 58.469 kg (128 lb 14.4 oz), SpO2 96  %. Physical Exam Thin, frail male in NAD Right Knee with no palpable effusion. Has slight warmth in both lower extremities but no difference between the 2 knees. No significant pain on passive range of motion of either knee. Will barely move actively due to feeling weak. No pain on palpation of knee  Assessment/Plan: Right knee pain- No signs of infection and no indication for knee aspiration. Please call if condition changes.  Mickel Crow 02/13/2015, 8:31 PM

## 2015-02-13 NOTE — Progress Notes (Signed)
PT Cancellation Note  Patient Details Name: Andre Tran MRN: [id number] DOB: May 30, 1961   Cancelled Treatment:    Reason Eval/Treat Not Completed: Patient at procedure or test/unavailable (in nuc med.  )   [name] F 02/13/2015, 4:27 PM [name],PT Acute Rehabilitation [phone] (pager)

## 2015-02-13 NOTE — Progress Notes (Signed)
Some bleeding noted in the gums,mouth care done.

## 2015-02-13 NOTE — Progress Notes (Signed)
OT Cancellation Note  Patient Details Name: Andre Tran MRN: [id number] DOB: November 06, 1960   Cancelled Treatment:    Reason Eval/Treat Not Completed: Patient at procedure or test/ unavailable. Will continue to follow.  [name] 02/13/2015, 3:12 PM

## 2015-02-13 NOTE — Progress Notes (Signed)
Andre Tran for Infectious Disease    Date of Admission:  01/28/2015     Subjective: Fever of 100.7 yesterday afternoon. Complaining of right knee pain, states about same as yesterday. Denies headaches, other joint pains.   Medications:  . amiodarone  200 mg Oral Daily  . amLODipine  2.5 mg Oral Daily  . apixaban  5 mg Oral BID  . atorvastatin  20 mg Oral QHS  . feeding supplement (GLUCERNA SHAKE)  237 mL Oral TID BM  . insulin aspart  0-5 Units Subcutaneous QHS  . insulin aspart  0-9 Units Subcutaneous TID WC  . insulin aspart  3 Units Subcutaneous TID WC  . insulin glargine  5 Units Subcutaneous QHS  . magnesium oxide  400 mg Oral Daily  . methocarbamol  500 mg Oral 4 times per day  . mirtazapine  7.5 mg Oral QHS  . pantoprazole  40 mg Oral Daily  . traMADol  50 mg Oral 4 times per day    Objective: Vital signs in last 24 hours: Temp:  [97.4 F (36.3 C)-100.7 F (38.2 C)] 97.4 F (36.3 C) (08/05 0805) Pulse Rate:  [81-105] 105 (08/05 0805) Resp:  [13-23] 16 (08/05 0805) BP: (92)/(0) 92/0 mmHg (08/04 1600) SpO2:  [94 %-98 %] 97 % (08/05 0805) Weight:  [128 lb 14.4 oz (58.469 kg)] 128 lb 14.4 oz (58.469 kg) (08/05 0457)  General: thin man sitting up in chair, NAD HEENT: [place]/AT, EOMI, sclera anicteric, pharynx non-erythematous with no exudates, mucus membranes moist CV: Regular rate, LVAD humming Pulm: CTA bilaterally, breaths non-labored  Abd: BS+, soft, non-tender, non-distended  Ext: Right knee appears swollen compared to left. He is significantly more tender today. No erythema. There is a small effusion present. No tenderness to palpation of other joints.  Neuro: alert and oriented x 3. Occasionally has difficulty finding words.  Skin: No rashes or bruising noted.    Lab Results  Recent Labs  02/12/15 0218 02/13/15 0612  WBC 14.9* 15.6*  HGB 9.2* 9.3*  HCT 28.0* 28.0*  NA 131* 132*  K 4.6 4.4  CL 97* 98*  CO2 26 24  BUN 18 19  CREATININE 0.98  1.10   Liver Panel No results for input(s): PROT, ALBUMIN, AST, ALT, ALKPHOS, BILITOT, BILIDIR, IBILI in the last 72 hours.  Microbiology: Blood cx 7/18>> negative Blood cx 7/22>> negative Urine cx 7/28 >> negative  Blood cx 8/2>> NGTD Fungal blood cx 8/3>> NGTD   Studies/Results: No results found. CT Chest/Abd/Pelvis 7/20>> No acute processes.  CT Head 7/22>> chronic ischemic changes from prior infarct X-ray right foot 7/24>> Negative for abnormalities CT Head 7/26>> Chronic ischemic changes from prior infarct. No new hemorrhage. CT Chest/Abd/Pelvis 7/26>> ill-defined low density (2cm) on lateral portion of midpole of left kidney, mild perinephritic stranding. Infarct vs infection.  Renal US 7/26>> Differential remains same as CT.  Tagged WBC Scan 8/4>>    Assessment/Plan:  Mr. Chiaramonte is a 54 y.o. man with PMHx of Non-Hodgkin's and Hodgkin's lymphoma, chemo-induced cardiomyopathy with LVAD, CVA, type 2 DM, and spontaneous intracranial hemorrhage in May 2016 who initially presented after a syncopal episode that resulted in a fall and now with fever of unknown origin.   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 EBV antibodies consistent with prior infection. All cultures negative or NGTD.   Awaiting results from tagged WBC scan, hopefully will be back today. Patient is significantly more tender on his  right knee today. I am worried he may have an infection brewing. His WBC count is trending up (15.6 today). I have consulted ortho to see if they can aspirate some fluid from the knee to send for analysis. Recommend getting cell count, crystal analysis, gram stain, and culture. It is also possible this is just inflammation from Still's disease.   If tagged WBC scan negative then can start Prednisone 20 mg daily.    Attending note to follow.   Albin Felling, MD, MPH Internal Medicine Resident, PGY-II Pager: [phone] 02/13/2015, 9:23  AM

## 2015-02-13 NOTE — Progress Notes (Signed)
Admitted 01/28/15 due to falls and failure to thrive.  HeartMate II LVAD implated on 10/13/14 by Dr. Cyndia Bent.   Vital signs: Temp:  97.8 - 100.7 HR:  81 - 85 Doppler MAP: 88 - 108 O2 Sat:  97 % RA Weight in lbs: .Marland Kitchen..136 > 137 > 138 > 148 > 136 > > 143 > 143 > 130 > 128  Telemetry:  SR 80'2  LVAD interrogation reveals:  Speed:  8400 Flow:   4.3 Power:  4.3 PI:  5.3 Alarms:  none Events:   5- 10 PI events Fixed speed:  8400 Low speed limit: 8000   Drive Line: Dressing C/D/I and WNL.  Next dressing to be changed 02/16/15  Labs:  LDH trend: 446 .Marland Kitchen.. 304 . 270 . 289 . 298 . 337 . 345 . 398 . 436 . 324   Plan/Recommendations as discussed with team: 1.  Planned nuclear scan today for WBC tag 2.  ID recommends right knee tap due to increased pain and tenderness

## 2015-02-14 LAB — GLUCOSE, CAPILLARY
GLUCOSE-CAPILLARY: 164 mg/dL — AB (ref 65–99)
Glucose-Capillary: 199 mg/dL — ABNORMAL HIGH (ref 65–99)
Glucose-Capillary: 108 mg/dL — ABNORMAL HIGH (ref 65–99)
Glucose-Capillary: 173 mg/dL — ABNORMAL HIGH (ref 65–99)

## 2015-02-14 LAB — CBC
HCT: 28.6 % — ABNORMAL LOW (ref 39.0–52.0)
Hemoglobin: 9.5 g/dL — ABNORMAL LOW (ref 13.0–17.0)
MCH: 28.9 pg (ref 26.0–34.0)
MCHC: 33.2 g/dL (ref 30.0–36.0)
MCV: 86.9 fL (ref 78.0–100.0)
PLATELETS: 358 10*3/uL (ref 150–400)
RBC: 3.29 MIL/uL — ABNORMAL LOW (ref 4.22–5.81)
RDW: 14.7 % (ref 11.5–15.5)
WBC: 12.2 10*3/uL — ABNORMAL HIGH (ref 4.0–10.5)

## 2015-02-14 LAB — LACTATE DEHYDROGENASE: LDH: 326 U/L — AB (ref 98–192)

## 2015-02-14 LAB — BASIC METABOLIC PANEL
Anion gap: 12 (ref 5–15)
BUN: 21 mg/dL — ABNORMAL HIGH (ref 6–20)
CALCIUM: 9 mg/dL (ref 8.9–10.3)
CO2: 26 mmol/L (ref 22–32)
Chloride: 94 mmol/L — ABNORMAL LOW (ref 101–111)
Creatinine, Ser: 1.16 mg/dL (ref 0.61–1.24)
GFR calc non Af Amer: >60 mL/min (ref >60)
Glucose, Bld: 144 mg/dL — ABNORMAL HIGH (ref 65–99)
Potassium: 4.6 mmol/L (ref 3.5–5.1)
SODIUM: 132 mmol/L — AB (ref 135–145)

## 2015-02-14 MED ORDER — PREDNISONE 20 MG PO TABS
20.0000 mg | ORAL_TABLET | Freq: Every day | ORAL | Status: DC
  Administered 2015-02-14 – 2015-02-16 (×3): 20 mg via ORAL
  Filled 2015-02-14 (×4): qty 1

## 2015-02-14 NOTE — Progress Notes (Signed)
Patient ID: Andre Tran, male   DOB: Aug 28, 1960, 54 y.o.   MRN: [id number] HeartMate 2 Rounding Note  Subjective:    Admitted after fall and failure to thrive and paroxysmal fevers. Recent 30 pound weight loss and anorexia. ESR 108. CT C/A/P no evidence of recurrent lymphoma or abscess.     TEE 7/23 no vegetations 7/25 VAD speed turned down to 8400  7/26 CT Abd Chest- Concern for mass L kidney.  7/27- Bone marrow aspirate.no evidence of clear-cut leukemia, lymphoma or MDS at this time 7/28 - Urology Consulted-- recommendations for biopsy. Urine culture sent  7/29 ESR 108 -> 65, Eliquis started.  8/1 WBC 15  8/2 T Max 101.7  8/4 Afebrile  Afebrile today, feels ok.  Less knee pain.  Walked in hall.   ID following. => multiple serologies sent and knee film ordered. Only major abnormality is markedly elevated ferritin (1,200) and mildly elevated RF suggesting diffuse inflammatory process (acute-phase reactant).  Tagged WBC scan negative.   Seen by ortho => right knee does not appear infected, not enough fluid to tap.    LDH 289 => 298=>337=>345 => 398=>436=>324 > 326  LVAD INTERROGATION:  HeartMate II LVAD:  Flow 4.5 liters/min, speed 8400, power 4.8  PI 6.1   7  PI events/24 hrs   Objective:    Vital Signs:   Temp:  [97.8 F (36.6 C)-99.8 F (37.7 C)] 98.3 F (36.8 C) (08/06 0749) Pulse Rate:  [91-92] 92 (08/05 2322) Resp:  [19-28] 23 (08/06 0749) SpO2:  [96 %-99 %] 97 % (08/06 0749) Weight:  [131 lb 1.6 oz (59.467 kg)] 131 lb 1.6 oz (59.467 kg) (08/06 0500) Last BM Date: 01/29/15 Mean arterial Pressure 80    Intake/Output:   Intake/Output Summary (Last 24 hours) at 02/14/15 1037 Last data filed at 02/14/15 0900  Gross per 24 hour  Intake    480 ml  Output    550 ml  Net    -70 ml     Physical Exam: General: Lying in bed.  Fatigued appearing.  HEENT: normal Neck: supple. JVP 5-6  Carotids 2+ bilat; no bruits. No lymphadenopathy or thryomegaly appreciated. Cor:  Mechanical heart sounds with LVAD hum present. Lungs: clear Abdomen: soft, nontender, nondistended. No hepatosplenomegaly. No bruits or masses. Good bowel sounds. Driveline: C/D/I; securement device intact and driveline incorporated Extremities: no cyanosis, clubbing, rash, edema  R knee painful to touch Neuro: alert & orientedx3, cranial nerves grossly intact. moves all 4 extremities w/o difficulty. Affect flat  Telemetry: SR 80s   Labs: Basic Metabolic Panel:  Recent Labs Lab 02/10/15 0310 02/11/15 0301 02/12/15 0218 02/13/15 0612 02/14/15 0240  NA 130* 132* 131* 132* 132*  K 4.2 4.5 4.6 4.4 4.6  CL 93* 94* 97* 98* 94*  CO2 _0 GLUCOSE 159* 135* 175* 132* 144*  BUN 21* 22* 18 19 21*  CREATININE 1.06 1.11 0.98 1.10 1.16  CALCIUM 9.0 9.3 8.7* 9.0 9.0    Liver Function Tests:  Recent Labs Lab 02/08/15 0222 02/09/15 0225  AST 70* 80*  ALT 96* 92*  ALKPHOS 143* 138*  BILITOT 0.8 1.1  PROT 7.1 7.2  ALBUMIN 2.9* 3.1*   No results for input(s): LIPASE, AMYLASE in the last 168 hours. No results for input(s): AMMONIA in the last 168 hours.  CBC:  Recent Labs Lab 02/10/15 0310 02/11/15 0301 02/12/15 0218 02/13/15 0612 02/14/15 0240  WBC 13.1* 12.4* 14.9* 15.6* 12.2*  HGB 10.1* 9.6*  9.2* 9.3* 9.5*  HCT 30.0* 29.3* 28.0* 28.0* 28.6*  MCV 85.0 85.9 85.6 85.9 86.9  PLT 351 374 386 356 358    INR: No results for input(s): INR in the last 168 hours.  Other results:    Imaging: Nm Wbc Scan Tumor  02/13/2015   CLINICAL DATA:  Fever of unknown origin. RIGHT knee swelling for several days.  EXAM: NUCLEAR MEDICINE LEUKOCYTE SCAN  TECHNIQUE: Following intravenous administration of radiolabeled white blood cells, images of the head, neck, trunk, and extremities were obtained on subsequent days.  RADIOPHARMACEUTICALS:  Six hundred uCi Indium-111 labeled autologous leukocytes IV  COMPARISON:  CT 02/03/2015  FINDINGS: There is no abnormal accumulation of white  blood cells within the chest, abdomen, or pelvis. No abnormal accumulation within extremities. Physiologic uptake noted the marrow space, liver and spleen.  IMPRESSION: No abnormal accumulation of labeled white blood cells to localize infection.   Electronically Signed   By: Suzy Bouchard M.D.   On: 02/13/2015 16:55     Medications:     Scheduled Medications: . amiodarone  200 mg Oral Daily  . amLODipine  2.5 mg Oral Daily  . apixaban  5 mg Oral BID  . atorvastatin  20 mg Oral QHS  . feeding supplement (GLUCERNA SHAKE)  237 mL Oral TID BM  . insulin aspart  0-5 Units Subcutaneous QHS  . insulin aspart  0-9 Units Subcutaneous TID WC  . insulin aspart  3 Units Subcutaneous TID WC  . insulin glargine  5 Units Subcutaneous QHS  . lidocaine (PF)  30 mL Infiltration Once  . magnesium oxide  400 mg Oral Daily  . methocarbamol  500 mg Oral 4 times per day  . mirtazapine  7.5 mg Oral QHS  . pantoprazole  40 mg Oral Daily  . predniSONE  20 mg Oral Q breakfast  . traMADol  50 mg Oral 4 times per day    Infusions:    PRN Medications: acetaminophen, albuterol, bisacodyl, fentaNYL (SUBLIMAZE) injection, midazolam   Assessment:   1. Fever and failure to thrive 2. Chronic systolic HF - Status post LVAD implantation for DT.  - 7/22 Speed turned down to 8600.  - 7/24 speed turned down to 8400 3. H/o Non Hodgkins Lymphoma s/p therapy- CT chest/abd/pelvis ok.  4.  H/O CVA 2008  5. H/O VT/VF --St Jude ICD 2010 - Interrogated device. No arrhythmias noted.   6. DMII- not on any meds. Uncontrolled DM.  7. PSVT- Quiescent on amiodarone 200 mg daily. 8. Recent ICH 5/16.  -stable no new neuro deficits. Head CT - no recurrence 9. Hyponatremia Na 127  10. Severe malnutrition in context of chronic illness 11. R heel pain--Xray ok .  12. Headache- Resolved.  13. L knee pain- ortho evaluated, does not appear infected and no fluid to tap. 14. LFT elevation- mild  Per Oncology Bone   Marrow biopsy-->no evidence of clear-cut leukemia, lymphoma or MDS at this times  ID following. Afebrile last 2 days.  Tagged WBC scan negative.  I will start patient on prednisone 20 mg daily today.  Suspicion for Still's disease as cause of periodic fever and athralgias.    He remains on Eliquis, so far stable with this.   Would observe until Monday.  If no further fevers and doing well, can send home.   I reviewed the LVAD parameters from today, and compared the results to the patient's prior recorded data.  No programming changes were made.  The LVAD  is functioning within specified parameters.  The patient performs LVAD self-test daily.  LVAD interrogation was negative for any significant power changes, alarms or PI events/speed drops.  LVAD equipment check completed and is in good working order.  Back-up equipment present.   LVAD education done on emergency procedures and precautions and reviewed exit site care.  Length of Stay: [place]   02/14/2015, 10:37 AM  VAD Team --- VAD ISSUES ONLY--- Pager [phone] (7am - 7am)  Advanced Heart Failure Team  Pager [phone] (M-F; 7a - 4p)  Please contact [facility] for night-coverage after hours (4p -7a ) and weekends on [email or web address]

## 2015-02-14 NOTE — Progress Notes (Signed)
CARDIAC REHAB PHASE I   PRE:  Rate/Rhythm: 90 SR  BP:  Supine:   Sitting: MAP 100  Standing:    SaO2: 100%RA  MODE:  Ambulation: 175 ft   POST:  Rate/Rhythm: 124 ST  BP:  Supine:   Sitting: MAP 100  Standing:    SaO2: 99%RA 1428-1513 Helped pt to connect to batteries and then back to power source. Pt tried to do but does not have the strength. Encouraged to do as much as he could. Pt was walking well with rolling walker and asst x 2 when he started trying to belch and he needed to sit. Pt sat and started belching over and over. Asked to be rolled back to room. Denied nausea. Gave pt gingerale and notified RN. Pt with call bell. Back to power source. Pt felt better after belching.   Graylon Good, RN BSN  02/14/2015 3:08 PM

## 2015-02-15 DIAGNOSIS — D72829 Elevated white blood cell count, unspecified: Secondary | ICD-10-CM

## 2015-02-15 LAB — CULTURE, BLOOD (ROUTINE X 2)
Culture: NO GROWTH
Culture: NO GROWTH

## 2015-02-15 LAB — BASIC METABOLIC PANEL
Anion gap: 11 (ref 5–15)
BUN: 27 mg/dL — ABNORMAL HIGH (ref 6–20)
CO2: 25 mmol/L (ref 22–32)
Calcium: 9.3 mg/dL (ref 8.9–10.3)
Chloride: 94 mmol/L — ABNORMAL LOW (ref 101–111)
Creatinine, Ser: 1.04 mg/dL (ref 0.61–1.24)
GFR calc Af Amer: >60 mL/min (ref >60)
GLUCOSE: 196 mg/dL — AB (ref 65–99)
Potassium: 5.1 mmol/L (ref 3.5–5.1)
Sodium: 130 mmol/L — ABNORMAL LOW (ref 135–145)

## 2015-02-15 LAB — CBC
HEMATOCRIT: 32.8 % — AB (ref 39.0–52.0)
HEMOGLOBIN: 11.5 g/dL — AB (ref 13.0–17.0)
MCH: 29.8 pg (ref 26.0–34.0)
MCHC: 35.1 g/dL (ref 30.0–36.0)
MCV: 85 fL (ref 78.0–100.0)
Platelets: 337 10*3/uL (ref 150–400)
RBC: 3.86 MIL/uL — ABNORMAL LOW (ref 4.22–5.81)
RDW: 14.3 % (ref 11.5–15.5)
WBC: 8.9 10*3/uL (ref 4.0–10.5)

## 2015-02-15 LAB — LACTATE DEHYDROGENASE: LDH: 457 U/L — AB (ref 98–192)

## 2015-02-15 LAB — GLUCOSE, CAPILLARY
GLUCOSE-CAPILLARY: 220 mg/dL — AB (ref 65–99)
Glucose-Capillary: 187 mg/dL — ABNORMAL HIGH (ref 65–99)
Glucose-Capillary: 256 mg/dL — ABNORMAL HIGH (ref 65–99)
Glucose-Capillary: 350 mg/dL — ABNORMAL HIGH (ref 65–99)

## 2015-02-15 NOTE — Progress Notes (Addendum)
[facility] for Infectious Disease    Date of Admission:  01/28/2015      ID: Andre Tran is a 54 y.o. malewith Hx of Non-Hodgkin's and Hodgkin's lymphoma, chemo-induced cardiomyopathy s/p HeartMate II  LVAD, CVA, type 2 DM, and spontaneous intracranial hemorrhage in May 2016 who initially presented after a syncopal episode that resulted in a fall and now with fever of unknown origin, intermittent arthralgias. He underwent extensive infectious work up and felt that his presentation could likely be due to adult onset Still's disease. Has started on prednisone 20 mg daily x 3 days, afebrile x 3 days, leukocytosis improved  Principal Problem:   Fever Active Problems:   Chronic systolic heart failure   LVAD (left ventricular assist device) present   Unexplained weight loss   Fall   Failure to thrive (0-17)   FUO (fever of unknown origin)   Anemia   Left kidney mass   Leukocytosis   Fever, unknown origin   Knee pain   Still's disease   Right sided weakness   Knee effusion    Subjective: Afebrile, "feeling great", right knee not causing him any discomfort, ambulates without difficulty. Watched olympics til 2am last night. Good appetite  Medications:  . amiodarone  200 mg Oral Daily  . amLODipine  2.5 mg Oral Daily  . apixaban  5 mg Oral BID  . atorvastatin  20 mg Oral QHS  . feeding supplement (GLUCERNA SHAKE)  237 mL Oral TID BM  . insulin aspart  0-5 Units Subcutaneous QHS  . insulin aspart  0-9 Units Subcutaneous TID WC  . insulin aspart  3 Units Subcutaneous TID WC  . insulin glargine  5 Units Subcutaneous QHS  . lidocaine (PF)  30 mL Infiltration Once  . magnesium oxide  400 mg Oral Daily  . methocarbamol  500 mg Oral 4 times per day  . mirtazapine  7.5 mg Oral QHS  . pantoprazole  40 mg Oral Daily  . predniSONE  20 mg Oral Q breakfast  . traMADol  50 mg Oral 4 times per day    Objective: Vital signs in last 24 hours: Temp:  [97.3 F (36.3 C)-98.8 F (37.1  C)] 97.6 F (36.4 C) (08/07 0400) Pulse Rate:  [68-88] 88 (08/07 0800) Resp:  [15-21] 15 (08/07 0400) SpO2:  [95 %-98 %] 98 % (08/07 0400) Weight:  [130 lb 4.7 oz (59.1 kg)] 130 lb 4.7 oz (59.1 kg) (08/07 0400) gen = a xo by 3 in nad HEENT = no thrush Ext = no signs of effusion or warmth to knees Skin = c/d/i  Lab Results  Recent Labs  02/14/15 0240 02/15/15 0410  WBC 12.2* 8.9  HGB 9.5* 11.5*  HCT 28.6* 32.8*  NA 132* 130*  K 4.6 5.1  CL 94* 94*  CO2 26 25  BUN 21* 27*  CREATININE 1.16 1.04   Lab Results  Component Value Date   ESRSEDRATE 65* 02/06/2015     Microbiology: Blood cx 7/18>> negative Blood cx 7/22>> negative Urine cx 7/28 >> negative  Blood cx 8/2>> NGTD Fungal blood cx 8/3>> NGTD   Studies/Results:  Imaging Results (Last 48 hours)    No results found.   CT Chest/Abd/Pelvis 7/20>> No acute processes.  CT Head 7/22>> chronic ischemic changes from prior infarct X-ray right foot 7/24>> Negative for abnormalities CT Head 7/26>> Chronic ischemic changes from prior infarct. No new hemorrhage. CT Chest/Abd/Pelvis 7/26>> ill-defined low density (2cm) on lateral portion of  midpole of left kidney, mild perinephritic stranding. Infarct vs infection.  Renal US 7/26>> Differential remains same as CT.  Tagged WBC Scan 8/4>> no abn accumulation of labeled wBC       Assessment/Plan: Andre Tran is a 54 y.o. man with Hx of Non-Hodgkin's and Hodgkin's lymphoma, chemo-induced cardiomyopathy s/p HeartMate II  LVAD, CVA, type 2 DM, and spontaneous intracranial hemorrhage in May 2016 who initially presented after a syncopal episode that resulted in a fall and now with fever of unknown origin, intermittent arthralgias. He underwent extensive infectious work up and felt that his presentation could likely be due to adult onset Still's disease. Has started on prednisone 20 mg daily x 3 days, afebrile x 3 days, leukocytosis improved  Fever of unknown origin, possible  adult Still's disease:  Clinical symptoms of fevers and arthralgias appear improving with steroids. Continue with prednisone 20mg  daily. Defer to Dr. Aundra Dubin if he would like patient to follow up with rheumatology in order to determine when to taper over the next few months or consider using other agents vs. Getting informal guidance from rheumatology  Knee pain = resolved with steroids  Leukocytosis = improved   Will sign off.   Baxter Flattery [facility] for Infectious Diseases Cell: [phone] Pager: [phone]  02/15/2015, 10:22 AM

## 2015-02-15 NOTE — Progress Notes (Signed)
Patient ID: Andre Tran, male   DOB: [birth year], 54 y.o.   MRN: [id number] HeartMate 2 Rounding Note  Subjective:    Admitted after fall and failure to thrive and paroxysmal fevers. Recent 30 pound weight loss and anorexia. ESR 108. CT C/A/P no evidence of recurrent lymphoma or abscess.     TEE 7/23 no vegetations 7/25 VAD speed turned down to 8400  7/26 CT Abd Chest- Concern for mass L kidney.  7/27- Bone marrow aspirate.no evidence of clear-cut leukemia, lymphoma or MDS at this time 7/28 - Urology Consulted-- recommendations for biopsy. Urine culture sent  7/29 ESR 108 -> 65, Eliquis started.  8/1 WBC 15  8/2 T Max 101.7  8/4 Afebrile  ID following. => multiple serologies sent and knee film ordered. Only major abnormality is markedly elevated ferritin (1,200) and mildly elevated RF suggesting diffuse inflammatory process (acute-phase reactant).  Tagged WBC scan negative.   Seen by ortho => right knee does not appear infected, not enough fluid to tap.    Feels great. Remains afebrile. Appetite much improved. Much more alert.  Joint pains better.   LDH 289 => 298=>337=>345 => 398=>436=>324 > 326 > 457  LVAD INTERROGATION:  HeartMate II LVAD:  Flow 4.4 liters/min, speed 8400, power 4.4 PI 6.3   Occasional PI events/24 hrs   Objective:    Vital Signs:   Temp:  [97.3 F (36.3 C)-98.7 F (37.1 C)] 97.8 F (36.6 C) (08/07 1145) Pulse Rate:  [68-88] 88 (08/07 0800) Resp:  [15-20] 16 (08/07 1145) SpO2:  [95 %-98 %] 97 % (08/07 1145) Weight:  [59.1 kg (130 lb 4.7 oz)] 59.1 kg (130 lb 4.7 oz) (08/07 0400) Last BM Date: 01/29/15 Mean arterial Pressure 70-80s  Intake/Output:   Intake/Output Summary (Last 24 hours) at 02/15/15 1300 Last data filed at 02/15/15 0400  Gross per 24 hour  Intake    120 ml  Output    300 ml  Net   -180 ml     Physical Exam: General: Lying in bed.  Alert and well appearing HEENT: normal Neck: supple. JVP 5-6  Carotids 2+ bilat; no bruits. No  lymphadenopathy or thryomegaly appreciated. Cor: Mechanical heart sounds with LVAD hum present. Lungs: clear Abdomen: soft, nontender, nondistended. No hepatosplenomegaly. No bruits or masses. Good bowel sounds. Driveline: C/D/I; securement device intact and driveline incorporated Extremities: no cyanosis, clubbing, rash, edema   Neuro: alert & orientedx3, cranial nerves grossly intact. moves all 4 extremities w/o difficulty. Affect flat  Telemetry: SR 80s   Labs: Basic Metabolic Panel:  Recent Labs Lab 02/11/15 0301 02/12/15 0218 02/13/15 0612 02/14/15 0240 02/15/15 0410  NA 132* 131* 132* 132* 130*  K 4.5 4.6 4.4 4.6 5.1  CL 94* 97* 98* 94* 94*  CO2 _0 GLUCOSE 135* 175* 132* 144* 196*  BUN 22* 18 19 21* 27*  CREATININE 1.11 0.98 1.10 1.16 1.04  CALCIUM 9.3 8.7* 9.0 9.0 9.3    Liver Function Tests:  Recent Labs Lab 02/09/15 0225  AST 80*  ALT 92*  ALKPHOS 138*  BILITOT 1.1  PROT 7.2  ALBUMIN 3.1*   No results for input(s): LIPASE, AMYLASE in the last 168 hours. No results for input(s): AMMONIA in the last 168 hours.  CBC:  Recent Labs Lab 02/11/15 0301 02/12/15 0218 02/13/15 0612 02/14/15 0240 02/15/15 0410  WBC 12.4* 14.9* 15.6* 12.2* 8.9  HGB 9.6* 9.2* 9.3* 9.5* 11.5*  HCT 29.3* 28.0* 28.0* 28.6* 32.8*  MCV  85.9 85.6 85.9 86.9 85.0  PLT 374 386 356 358 337    INR: No results for input(s): INR in the last 168 hours.  Other results:    Imaging: Nm Wbc Scan Tumor  02/13/2015   CLINICAL DATA:  Fever of unknown origin. RIGHT knee swelling for several days.  EXAM: NUCLEAR MEDICINE LEUKOCYTE SCAN  TECHNIQUE: Following intravenous administration of radiolabeled white blood cells, images of the head, neck, trunk, and extremities were obtained on subsequent days.  RADIOPHARMACEUTICALS:  Six hundred uCi Indium-111 labeled autologous leukocytes IV  COMPARISON:  CT 02/03/2015  FINDINGS: There is no abnormal accumulation of white blood cells  within the chest, abdomen, or pelvis. No abnormal accumulation within extremities. Physiologic uptake noted the marrow space, liver and spleen.  IMPRESSION: No abnormal accumulation of labeled white blood cells to localize infection.   Electronically Signed   By: Suzy Bouchard M.D.   On: 02/13/2015 16:55     Medications:     Scheduled Medications: . amiodarone  200 mg Oral Daily  . amLODipine  2.5 mg Oral Daily  . apixaban  5 mg Oral BID  . atorvastatin  20 mg Oral QHS  . feeding supplement (GLUCERNA SHAKE)  237 mL Oral TID BM  . insulin aspart  0-5 Units Subcutaneous QHS  . insulin aspart  0-9 Units Subcutaneous TID WC  . insulin aspart  3 Units Subcutaneous TID WC  . insulin glargine  5 Units Subcutaneous QHS  . lidocaine (PF)  30 mL Infiltration Once  . magnesium oxide  400 mg Oral Daily  . methocarbamol  500 mg Oral 4 times per day  . mirtazapine  7.5 mg Oral QHS  . pantoprazole  40 mg Oral Daily  . predniSONE  20 mg Oral Q breakfast  . traMADol  50 mg Oral 4 times per day    Infusions:    PRN Medications: acetaminophen, albuterol, bisacodyl, fentaNYL (SUBLIMAZE) injection, midazolam   Assessment:   1. Fever and failure to thrive 2. Chronic systolic HF - Status post LVAD implantation for DT.  - 7/22 Speed turned down to 8600.  - 7/24 speed turned down to 8400 3. H/o Non Hodgkins Lymphoma s/p therapy- CT chest/abd/pelvis ok.  4.  H/O CVA 2008  5. H/O VT/VF --St Jude ICD 2010 - Interrogated device. No arrhythmias noted.   6. DMII- not on any meds. Uncontrolled DM.  7. PSVT- Quiescent on amiodarone 200 mg daily. 8. Recent ICH 5/16.  -stable no new neuro deficits. Head CT - no recurrence 9. Hyponatremia Na 127  10. Severe malnutrition in context of chronic illness 11. R heel pain--Xray ok .  12. Headache- Resolved.  13. L knee pain- ortho evaluated, does not appear infected and no fluid to tap. 14. LFT elevation- mild   Suspicion for Still's disease  as cause of periodic fever and athralgias. Much improved on prednisone 20 daily.   He remains on Eliquis, so far stable with this. LDH up a bit. VAD parameters stable.  Hopefully home in am.  I reviewed the LVAD parameters from today, and compared the results to the patient's prior recorded data.  No programming changes were made.  The LVAD is functioning within specified parameters.  The patient performs LVAD self-test daily.  LVAD interrogation was negative for any significant power changes, alarms or PI events/speed drops.  LVAD equipment check completed and is in good working order.  Back-up equipment present.   LVAD education done on emergency procedures and precautions  and reviewed exit site care.  Length of Stay: 18  Glori Bickers  MD  02/15/2015, 1:00 PM  VAD Team --- VAD ISSUES ONLY--- Pager [phone] (7am - 7am)  Advanced Heart Failure Team  Pager [phone] (M-F; 7a - 4p)  Please contact [facility] for night-coverage after hours (4p -7a ) and weekends on [email or web address]

## 2015-02-16 ENCOUNTER — Encounter (HOSPITAL_COMMUNITY): Payer: Self-pay | Admitting: Infectious Diseases

## 2015-02-16 ENCOUNTER — Ambulatory Visit (HOSPITAL_COMMUNITY): Payer: Medicare Other

## 2015-02-16 LAB — CBC
HCT: 27.2 % — ABNORMAL LOW (ref 39.0–52.0)
HEMOGLOBIN: 9.1 g/dL — AB (ref 13.0–17.0)
MCH: 28.3 pg (ref 26.0–34.0)
MCHC: 33.5 g/dL (ref 30.0–36.0)
MCV: 84.7 fL (ref 78.0–100.0)
Platelets: 353 10*3/uL (ref 150–400)
RBC: 3.21 MIL/uL — ABNORMAL LOW (ref 4.22–5.81)
RDW: 14 % (ref 11.5–15.5)
WBC: 9.7 10*3/uL (ref 4.0–10.5)

## 2015-02-16 LAB — BASIC METABOLIC PANEL
Anion gap: 9 (ref 5–15)
BUN: 22 mg/dL — ABNORMAL HIGH (ref 6–20)
CALCIUM: 9.2 mg/dL (ref 8.9–10.3)
CO2: 27 mmol/L (ref 22–32)
CREATININE: 1.01 mg/dL (ref 0.61–1.24)
Chloride: 92 mmol/L — ABNORMAL LOW (ref 101–111)
GFR calc non Af Amer: >60 mL/min (ref >60)
Glucose, Bld: 277 mg/dL — ABNORMAL HIGH (ref 65–99)
Potassium: 4.4 mmol/L (ref 3.5–5.1)
Sodium: 128 mmol/L — ABNORMAL LOW (ref 135–145)

## 2015-02-16 LAB — LACTATE DEHYDROGENASE: LDH: 340 U/L — ABNORMAL HIGH (ref 98–192)

## 2015-02-16 LAB — GLUCOSE, CAPILLARY
GLUCOSE-CAPILLARY: 217 mg/dL — AB (ref 65–99)
GLUCOSE-CAPILLARY: 362 mg/dL — AB (ref 65–99)

## 2015-02-16 MED ORDER — TRAMADOL HCL 50 MG PO TABS: 50.0000 mg | ORAL_TABLET | Freq: Two times a day (BID) | ORAL | Status: AC | PRN

## 2015-02-16 MED ORDER — PREDNISONE 20 MG PO TABS: 20.0000 mg | ORAL_TABLET | Freq: Every day | ORAL | Status: AC

## 2015-02-16 MED ORDER — AMLODIPINE BESYLATE 2.5 MG PO TABS: 2.5000 mg | ORAL_TABLET | Freq: Every day | ORAL | Status: AC

## 2015-02-16 MED ORDER — APIXABAN 5 MG PO TABS: 5.0000 mg | ORAL_TABLET | Freq: Two times a day (BID) | ORAL | Status: AC

## 2015-02-16 NOTE — Care Management Important Message (Signed)
Important Message  Patient Details  Name: Andre Tran MRN: [id number] Date of Birth: 1961/06/03   Medicare Important Message Given:  Yes-third notification given    Pricilla Handler 02/16/2015, 2:07 PM

## 2015-02-16 NOTE — Care Management Note (Signed)
Case Management Note  Patient Details  Name: Andre Tran MRN: [id number] Date of Birth: [birth year]  Subjective/Objective:      CHF, LVAD              Action/Plan: Home   Expected Discharge Date:  02/16/2015              Expected Discharge Plan:  Home/Self Care  In-House Referral:     Discharge planning Services  CM Consult, Medication Assistance   Status of Service:  Completed, signed off  Medicare Important Message Given:  Yes-second notification given Date Medicare IM Given:    Medicare IM give by:    Date Additional Medicare IM Given:    Additional Medicare Important Message give by:     If discussed at [place] of Stay Meetings, dates discussed:    Additional Comments: Wife has Eliquis 30 day trial card. Will take to pharmacy with Rx.  Erenest Rasher, RN 02/16/2015, 12:54 PM

## 2015-02-16 NOTE — Progress Notes (Signed)
LVAD Coordinator D/C Note:  Appointment made for patient to be seen by Dr. Marquis Buggy with [facility] Rheumatology outpatient on Tuesday 02/24/15 @ 0900. (letter sent with patient at D/C with details). F/U with VAD Clinic next Tuesday 02/24/15 @ 1400.  Attempted to discuss medication changes with patient, however he requested we wait for his wife. Will come back when she is present.

## 2015-02-16 NOTE — Discharge Summary (Signed)
Advanced Heart Failure Team  Discharge Summary   Patient ID: Andre Tran MRN: [id number], DOB/AGE: 1961/06/02 54 y.o. Admit date: 01/28/2015 D/C date:     02/16/2015   Primary Discharge Diagnoses:  1. Adult Still's disease  2. Fever, recurrent fevers 3. Weigh loss 4. Chronic systolic HF s/p HM-II VAD 5. Recent intracranial hemorrhage 6. Left renal infarct, acute 7. Small left upper pole renal mass 8. H/o lymphoma 9. PAF 10. Hyponatremia 11. Moderate to severe protein calorie malnutrition    Hospital Course:   Andre Tran is a 54 y.o. 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 Underwent HMII LVAD placement 10/13/2014   Admitted 5/16 with headache and CT showed spontaneous intracerebral bleed with INR 3.1.   Admitted from clinic on 7/20 with recurrent fevers and 30 pound weight loss. While in hospital underwent extensive w/u as outlined below. ID and the Oncology services were consulted. Wit help from the ID team, we also discussed the case with Dr. Amil Amen in Rheumatology by telephone. There was no evidence of infection or malignancy. Eventually diagnosed with Adult Still's disease and started on prednisone 20 daily with marked improvement. While in hospital also found to have renal infarct and small renal mass in left kidney (exophytic but too small to biopsy). Given recent intracranial bleed and then renal infarct on therapeutic dosing of warfarin decision was made to switch to Eliquis.  While in the hospital TEE showed improvement of LV function with EF 35-40%. With multiple PI events VAD speed turned down.   Will be discharged home on prednisone 20 daily and Eliquis 5 bid. Will need f/u with  Rheumatology after dischare  -Urinalysis and multiple BCX negative -TEE no evidence of vegetation -Bone marrow biopsy negative -CT scan brain/C/A/P - no evidence of infection/abscess + left renal infarct and 9 mm upper pole exophytic lesion in left kidney (reviewed with Urology and IR) - recommend repeat CT in 3-6 months -HIV, Hepatitis, uric acid and tick serologies all negative -ESR 108, with ferritin > 1,200 -Tagged WBC scan negative   LVAD Interrogation HM II:   Speed: 3.9    Flow: 8400     PI:  5.8    Power: 4.1  Occasional PI events  Discharge Vitals: Blood pressure 101/78, pulse 88, temperature 97.5 F (36.4 C), temperature source Oral, resp. rate 16, height $RemoveBe'5\' 7"'UtYBAFKmG$  (1.702 m), weight 134 lb 7.7 oz (61 kg), SpO2 96 %.  Physical Exam: General: Sitting in chair Well appearing. No resp difficulty HEENT: normal Neck: supple. JVP 7. Carotids 2+ bilat; no bruits. No lymphadenopathy or thryomegaly appreciated. Cor: Mechanical heart sounds with LVAD hum present. Lungs: clear Abdomen: soft, nontender, nondistended. No hepatosplenomegaly. No bruits or masses. Good bowel sounds. Driveline: C/D/I; securement device intact and driveline incorporated Extremities: no cyanosis, clubbing, rash, edema Neuro: alert & orientedx3, cranial nerves grossly intact. moves all 4 extremities w/o difficulty. Affect pleasant  Telemetry: NSR  Labs: Lab Results  Component Value Date   WBC 9.7 02/16/2015   HGB 9.1* 02/16/2015   HCT 27.2* 02/16/2015   MCV 84.7 02/16/2015   PLT 353 02/16/2015     Recent Labs Lab 02/16/15 0305  NA 128*  K 4.4  CL 92*  CO2 27  BUN 22*  CREATININE 1.01  CALCIUM 9.2  GLUCOSE 277*   Lab Results  Component Value Date   CHOL 106 06/21/2014   HDL 36* 06/21/2014   LDLCALC 58 06/21/2014   TRIG 61 06/21/2014   BNP (last 3 results)  Recent Labs  11/05/14 1030 12/19/14 1115 01/26/15 1045  BNP 118.8* 82.3 63.0    ProBNP (last 3 results)  Recent Labs   06/12/14 1415 06/18/14 1200  PROBNP 2079.0* 2144.0*     Diagnostic Studies/Procedures   No results found.  Discharge Medications     Medication List    STOP taking these medications        warfarin 5 MG tablet  Commonly known as:  COUMADIN      TAKE these medications        acetaminophen 650 MG CR tablet  Commonly known as:  TYLENOL  Take 1 tablet (650 mg total) by mouth every 8 (eight) hours as needed for pain.     albuterol 108 (90 BASE) MCG/ACT inhaler  Commonly known as:  PROVENTIL HFA;VENTOLIN HFA  Inhale 1-2 puffs into the lungs every 6 (six) hours as needed for wheezing or shortness of breath.     amiodarone 200 MG tablet  Commonly known as:  PACERONE  Take 1 tablet (200 mg total) by mouth daily.     amLODipine 2.5 MG tablet  Commonly known as:  NORVASC  Take 1 tablet (2.5 mg total) by mouth daily.     apixaban 5 MG Tabs tablet  Commonly known as:  ELIQUIS  Take 1 tablet (5 mg total) by mouth 2 (two) times daily.     atorvastatin 40 MG tablet  Commonly known as:  LIPITOR  Take 0.5 tablets (20 mg total) by mouth at bedtime.     bisacodyl 5 MG EC tablet  Commonly known as:  DULCOLAX  Take 1 tablet (5 mg total) by mouth daily as needed for moderate constipation.     ENSURE NUTRITION SHAKE Liqd  Take 1 Bottle by mouth 2 (two) times daily at 10 AM and 5 PM.     insulin glargine 100 UNIT/ML injection  Commonly known as:  LANTUS  Inject 0.05 mLs (5 Units total) into the skin at bedtime.     Magnesium 400 MG Tabs  Take 1 tablet by mouth daily with breakfast.     methocarbamol 500 MG tablet  Commonly known as:  ROBAXIN  Take 1 tablet (500 mg total) by mouth every 6 (six) hours.     pantoprazole 40 MG tablet  Commonly known as:  PROTONIX  Take 1 tablet (40 mg total) by mouth daily.     predniSONE 20 MG tablet  Commonly known as:  DELTASONE  Take 1 tablet (20 mg total) by mouth daily with breakfast.     traMADol 50 MG tablet  Commonly known as:   ULTRAM  Take 1 tablet (50 mg total) by mouth every 12 (twelve) hours as needed for moderate pain or severe pain.        Disposition   The patient will be discharged in stable condition to home. Discharge Instructions    Contraindication to ACEI at discharge    Complete by:  As directed      Diet - low sodium heart healthy    Complete by:  As directed      Heart Failure patients record your daily weight using the same scale at the same time of day    Complete by:  As directed      Increase activity slowly    Complete by:  As directed           Follow-up Information    Follow up with Glori Bickers, MD On 02/23/2015.   Specialty:  Cardiology   Why:  at 2:00 pm for post hospital follow up.  Please bring all of your medications with you to your appointment.  Pt parking code is [id number].   Contact information:   [place] [address] [phone]         Duration of Discharge Encounter: Greater than 35 minutes   F/u appointments:  1. HF Clinic 2. Rheumatology (Dr. Leigh Aurora) -   Signed, Shadman Tozzi,MD 12:33 AM

## 2015-02-18 ENCOUNTER — Encounter (HOSPITAL_COMMUNITY): Payer: Self-pay | Admitting: Radiology

## 2015-02-18 ENCOUNTER — Emergency Department (HOSPITAL_COMMUNITY): Payer: Medicare Other

## 2015-02-18 ENCOUNTER — Inpatient Hospital Stay (HOSPITAL_COMMUNITY)
Admission: EM | Admit: 2015-02-18 | Discharge: 2015-03-12 | DRG: 064 | Disposition: E | Payer: Medicare Other | Attending: Internal Medicine | Admitting: Internal Medicine

## 2015-02-18 ENCOUNTER — Inpatient Hospital Stay (HOSPITAL_COMMUNITY): Payer: Medicare Other

## 2015-02-18 ENCOUNTER — Ambulatory Visit (HOSPITAL_COMMUNITY): Payer: Medicare Other

## 2015-02-18 ENCOUNTER — Telehealth (HOSPITAL_COMMUNITY): Payer: Self-pay | Admitting: Infectious Diseases

## 2015-02-18 DIAGNOSIS — R40243 Glasgow coma scale score 3-8: Secondary | ICD-10-CM | POA: Diagnosis present

## 2015-02-18 DIAGNOSIS — Z95810 Presence of automatic (implantable) cardiac defibrillator: Secondary | ICD-10-CM | POA: Diagnosis not present

## 2015-02-18 DIAGNOSIS — G936 Cerebral edema: Secondary | ICD-10-CM | POA: Diagnosis not present

## 2015-02-18 DIAGNOSIS — Z7901 Long term (current) use of anticoagulants: Secondary | ICD-10-CM | POA: Diagnosis not present

## 2015-02-18 DIAGNOSIS — Z86718 Personal history of other venous thrombosis and embolism: Secondary | ICD-10-CM

## 2015-02-18 DIAGNOSIS — G459 Transient cerebral ischemic attack, unspecified: Secondary | ICD-10-CM

## 2015-02-18 DIAGNOSIS — E871 Hypo-osmolality and hyponatremia: Secondary | ICD-10-CM | POA: Diagnosis present

## 2015-02-18 DIAGNOSIS — G9340 Encephalopathy, unspecified: Secondary | ICD-10-CM | POA: Diagnosis not present

## 2015-02-18 DIAGNOSIS — F329 Major depressive disorder, single episode, unspecified: Secondary | ICD-10-CM | POA: Diagnosis present

## 2015-02-18 DIAGNOSIS — I110 Hypertensive heart disease with heart failure: Secondary | ICD-10-CM | POA: Diagnosis present

## 2015-02-18 DIAGNOSIS — I639 Cerebral infarction, unspecified: Secondary | ICD-10-CM | POA: Insufficient documentation

## 2015-02-18 DIAGNOSIS — Z66 Do not resuscitate: Secondary | ICD-10-CM | POA: Diagnosis present

## 2015-02-18 DIAGNOSIS — G919 Hydrocephalus, unspecified: Secondary | ICD-10-CM | POA: Diagnosis present

## 2015-02-18 DIAGNOSIS — I615 Nontraumatic intracerebral hemorrhage, intraventricular: Secondary | ICD-10-CM | POA: Diagnosis present

## 2015-02-18 DIAGNOSIS — G458 Other transient cerebral ischemic attacks and related syndromes: Secondary | ICD-10-CM | POA: Diagnosis not present

## 2015-02-18 DIAGNOSIS — E782 Mixed hyperlipidemia: Secondary | ICD-10-CM | POA: Diagnosis present

## 2015-02-18 DIAGNOSIS — R531 Weakness: Secondary | ICD-10-CM

## 2015-02-18 DIAGNOSIS — G9341 Metabolic encephalopathy: Secondary | ICD-10-CM | POA: Diagnosis present

## 2015-02-18 DIAGNOSIS — R4701 Aphasia: Secondary | ICD-10-CM | POA: Diagnosis present

## 2015-02-18 DIAGNOSIS — Z79899 Other long term (current) drug therapy: Secondary | ICD-10-CM

## 2015-02-18 DIAGNOSIS — Z01818 Encounter for other preprocedural examination: Secondary | ICD-10-CM

## 2015-02-18 DIAGNOSIS — Z7952 Long term (current) use of systemic steroids: Secondary | ICD-10-CM

## 2015-02-18 DIAGNOSIS — I429 Cardiomyopathy, unspecified: Secondary | ICD-10-CM | POA: Diagnosis present

## 2015-02-18 DIAGNOSIS — N280 Ischemia and infarction of kidney: Secondary | ICD-10-CM | POA: Diagnosis present

## 2015-02-18 DIAGNOSIS — Z794 Long term (current) use of insulin: Secondary | ICD-10-CM

## 2015-02-18 DIAGNOSIS — J9600 Acute respiratory failure, unspecified whether with hypoxia or hypercapnia: Secondary | ICD-10-CM | POA: Diagnosis not present

## 2015-02-18 DIAGNOSIS — I69351 Hemiplegia and hemiparesis following cerebral infarction affecting right dominant side: Secondary | ICD-10-CM

## 2015-02-18 DIAGNOSIS — R4182 Altered mental status, unspecified: Secondary | ICD-10-CM | POA: Diagnosis present

## 2015-02-18 DIAGNOSIS — Z515 Encounter for palliative care: Secondary | ICD-10-CM

## 2015-02-18 DIAGNOSIS — Z95811 Presence of heart assist device: Secondary | ICD-10-CM | POA: Diagnosis not present

## 2015-02-18 DIAGNOSIS — E785 Hyperlipidemia, unspecified: Secondary | ICD-10-CM | POA: Diagnosis present

## 2015-02-18 DIAGNOSIS — E119 Type 2 diabetes mellitus without complications: Secondary | ICD-10-CM | POA: Diagnosis present

## 2015-02-18 DIAGNOSIS — M061 Adult-onset Still's disease: Secondary | ICD-10-CM | POA: Diagnosis present

## 2015-02-18 DIAGNOSIS — Z9289 Personal history of other medical treatment: Secondary | ICD-10-CM

## 2015-02-18 DIAGNOSIS — I611 Nontraumatic intracerebral hemorrhage in hemisphere, cortical: Secondary | ICD-10-CM | POA: Diagnosis not present

## 2015-02-18 DIAGNOSIS — Z8572 Personal history of non-Hodgkin lymphomas: Secondary | ICD-10-CM

## 2015-02-18 DIAGNOSIS — I619 Nontraumatic intracerebral hemorrhage, unspecified: Secondary | ICD-10-CM | POA: Diagnosis not present

## 2015-02-18 DIAGNOSIS — I5022 Chronic systolic (congestive) heart failure: Secondary | ICD-10-CM | POA: Diagnosis present

## 2015-02-18 DIAGNOSIS — Z4659 Encounter for fitting and adjustment of other gastrointestinal appliance and device: Secondary | ICD-10-CM

## 2015-02-18 DIAGNOSIS — J9601 Acute respiratory failure with hypoxia: Secondary | ICD-10-CM | POA: Diagnosis not present

## 2015-02-18 LAB — CBC WITH DIFFERENTIAL/PLATELET
BASOS ABS: 0 10*3/uL (ref 0.0–0.1)
BASOS PCT: 0 % (ref 0–1)
Basophils Absolute: 0 10*3/uL (ref 0.0–0.1)
Basophils Relative: 0 % (ref 0–1)
EOS PCT: 0 % (ref 0–5)
Eosinophils Absolute: 0 10*3/uL (ref 0.0–0.7)
Eosinophils Absolute: 0 10*3/uL (ref 0.0–0.7)
Eosinophils Relative: 0 % (ref 0–5)
HCT: 28.8 % — ABNORMAL LOW (ref 39.0–52.0)
HCT: 30.3 % — ABNORMAL LOW (ref 39.0–52.0)
HEMOGLOBIN: 9.6 g/dL — AB (ref 13.0–17.0)
Hemoglobin: 10 g/dL — ABNORMAL LOW (ref 13.0–17.0)
LYMPHS ABS: 1.5 10*3/uL (ref 0.7–4.0)
Lymphocytes Relative: 7 % — ABNORMAL LOW (ref 12–46)
Lymphocytes Relative: 10 % — ABNORMAL LOW (ref 12–46)
Lymphs Abs: 1.1 10*3/uL (ref 0.7–4.0)
MCH: 28.7 pg (ref 26.0–34.0)
MCH: 28.9 pg (ref 26.0–34.0)
MCHC: 33.3 g/dL (ref 30.0–36.0)
MCHC: 33 g/dL (ref 30.0–36.0)
MCV: 86.2 fL (ref 78.0–100.0)
MCV: 87.6 fL (ref 78.0–100.0)
MONOS PCT: 6 % (ref 3–12)
MONOS PCT: 4 % (ref 3–12)
Monocytes Absolute: 1 10*3/uL (ref 0.1–1.0)
Monocytes Absolute: 0.6 10*3/uL (ref 0.1–1.0)
NEUTROS ABS: 12.6 10*3/uL — AB (ref 1.7–7.7)
NEUTROS PCT: 86 % — AB (ref 43–77)
Neutro Abs: 13.2 10*3/uL — ABNORMAL HIGH (ref 1.7–7.7)
Neutrophils Relative %: 87 % — ABNORMAL HIGH (ref 43–77)
Platelets: 377 10*3/uL (ref 150–400)
Platelets: 425 10*3/uL — ABNORMAL HIGH (ref 150–400)
RBC: 3.34 MIL/uL — ABNORMAL LOW (ref 4.22–5.81)
RBC: 3.46 MIL/uL — ABNORMAL LOW (ref 4.22–5.81)
RDW: 14.4 % (ref 11.5–15.5)
RDW: 14.9 % (ref 11.5–15.5)
WBC: 15.3 10*3/uL — ABNORMAL HIGH (ref 4.0–10.5)
WBC: 14.7 10*3/uL — ABNORMAL HIGH (ref 4.0–10.5)

## 2015-02-18 LAB — POCT I-STAT, CHEM 8
BUN: 25 mg/dL — ABNORMAL HIGH (ref 6–20)
CALCIUM ION: 1.09 mmol/L — AB (ref 1.12–1.23)
CHLORIDE: 96 mmol/L — AB (ref 101–111)
Creatinine, Ser: 0.7 mg/dL (ref 0.61–1.24)
Glucose, Bld: 346 mg/dL — ABNORMAL HIGH (ref 65–99)
HCT: 32 % — ABNORMAL LOW (ref 39.0–52.0)
HEMOGLOBIN: 10.9 g/dL — AB (ref 13.0–17.0)
Potassium: 4.6 mmol/L (ref 3.5–5.1)
SODIUM: 132 mmol/L — AB (ref 135–145)
TCO2: 26 mmol/L (ref 0–100)

## 2015-02-18 LAB — COMPREHENSIVE METABOLIC PANEL
ALT: 104 U/L — ABNORMAL HIGH (ref 17–63)
ALT: 90 U/L — ABNORMAL HIGH (ref 17–63)
ANION GAP: 12 (ref 5–15)
AST: 76 U/L — AB (ref 15–41)
AST: 55 U/L — ABNORMAL HIGH (ref 15–41)
Albumin: 3.2 g/dL — ABNORMAL LOW (ref 3.5–5.0)
Albumin: 3.3 g/dL — ABNORMAL LOW (ref 3.5–5.0)
Alkaline Phosphatase: 216 U/L — ABNORMAL HIGH (ref 38–126)
Alkaline Phosphatase: 182 U/L — ABNORMAL HIGH (ref 38–126)
Anion gap: 11 (ref 5–15)
BILIRUBIN TOTAL: 0.6 mg/dL (ref 0.3–1.2)
BUN: 21 mg/dL — ABNORMAL HIGH (ref 6–20)
BUN: 19 mg/dL (ref 6–20)
CALCIUM: 9.7 mg/dL (ref 8.9–10.3)
CALCIUM: 9.6 mg/dL (ref 8.9–10.3)
CHLORIDE: 96 mmol/L — AB (ref 101–111)
CHLORIDE: 96 mmol/L — AB (ref 101–111)
CO2: 26 mmol/L (ref 22–32)
CO2: 26 mmol/L (ref 22–32)
CREATININE: 1.02 mg/dL (ref 0.61–1.24)
CREATININE: 1.02 mg/dL (ref 0.61–1.24)
GFR calc Af Amer: >60 mL/min (ref >60)
GFR calc non Af Amer: >60 mL/min (ref >60)
GLUCOSE: 401 mg/dL — AB (ref 65–99)
Glucose, Bld: 338 mg/dL — ABNORMAL HIGH (ref 65–99)
Potassium: 4.6 mmol/L (ref 3.5–5.1)
Potassium: 4.5 mmol/L (ref 3.5–5.1)
Sodium: 134 mmol/L — ABNORMAL LOW (ref 135–145)
Sodium: 133 mmol/L — ABNORMAL LOW (ref 135–145)
Total Bilirubin: 1 mg/dL (ref 0.3–1.2)
Total Protein: 7.1 g/dL (ref 6.5–8.1)
Total Protein: 7.6 g/dL (ref 6.5–8.1)

## 2015-02-18 LAB — FUNGUS CULTURE, BLOOD: CULTURE: NO GROWTH

## 2015-02-18 LAB — LIPID PANEL
CHOL/HDL RATIO: 4.5 ratio
Cholesterol: 156 mg/dL (ref 0–200)
HDL: 35 mg/dL — ABNORMAL LOW (ref >40)
LDL Cholesterol: 94 mg/dL (ref 0–99)
Triglycerides: 133 mg/dL (ref <150)
VLDL: 27 mg/dL (ref 0–40)

## 2015-02-18 LAB — PROTIME-INR
INR: 1.62 — AB (ref 0.00–1.49)
INR: 1.73 — ABNORMAL HIGH (ref 0.00–1.49)
PROTHROMBIN TIME: 19.3 s — AB (ref 11.6–15.2)
PROTHROMBIN TIME: 20.2 s — AB (ref 11.6–15.2)

## 2015-02-18 LAB — POCT I-STAT TROPONIN I: Troponin i, poc: 0 ng/mL (ref 0.00–0.08)

## 2015-02-18 LAB — ETHANOL

## 2015-02-18 LAB — TROPONIN I

## 2015-02-18 LAB — APTT: aPTT: 33 seconds (ref 24–37)

## 2015-02-18 LAB — LACTATE DEHYDROGENASE: LDH: 269 U/L — AB (ref 98–192)

## 2015-02-18 MED ORDER — TRAMADOL HCL 50 MG PO TABS: 50.0000 mg | ORAL_TABLET | Freq: Two times a day (BID) | ORAL | Status: DC | PRN

## 2015-02-18 MED ORDER — ACETAMINOPHEN 325 MG PO TABS: 650.0000 mg | ORAL_TABLET | Freq: Three times a day (TID) | ORAL | Status: DC | PRN

## 2015-02-18 MED ORDER — BISACODYL 5 MG PO TBEC
5.0000 mg | DELAYED_RELEASE_TABLET | Freq: Every day | ORAL | Status: DC | PRN
  Filled 2015-02-18: qty 1

## 2015-02-18 MED ORDER — ENSURE NUTRITION SHAKE PO LIQD: 1.0000 | Freq: Three times a day (TID) | ORAL | Status: DC

## 2015-02-18 MED ORDER — APIXABAN 5 MG PO TABS
5.0000 mg | ORAL_TABLET | Freq: Two times a day (BID) | ORAL | Status: DC
  Administered 2015-02-18 (×2): 5 mg via ORAL
  Filled 2015-02-18 (×2): qty 1

## 2015-02-18 MED ORDER — AMIODARONE HCL 200 MG PO TABS
200.0000 mg | ORAL_TABLET | Freq: Every day | ORAL | Status: DC
  Administered 2015-02-18: 200 mg via ORAL
  Filled 2015-02-18 (×2): qty 1

## 2015-02-18 MED ORDER — ASPIRIN EC 81 MG PO TBEC
81.0000 mg | DELAYED_RELEASE_TABLET | Freq: Every day | ORAL | Status: DC
  Administered 2015-02-18: 81 mg via ORAL
  Filled 2015-02-18 (×2): qty 1

## 2015-02-18 MED ORDER — AMLODIPINE BESYLATE 2.5 MG PO TABS
2.5000 mg | ORAL_TABLET | Freq: Every day | ORAL | Status: DC
  Administered 2015-02-18: 2.5 mg via ORAL
  Filled 2015-02-18 (×2): qty 1

## 2015-02-18 MED ORDER — MAGNESIUM 400 MG PO TABS: 1.0000 | ORAL_TABLET | Freq: Every day | ORAL | Status: DC

## 2015-02-18 MED ORDER — ACETAMINOPHEN ER 650 MG PO TBCR: 650.0000 mg | EXTENDED_RELEASE_TABLET | Freq: Three times a day (TID) | ORAL | Status: DC | PRN

## 2015-02-18 MED ORDER — MAGNESIUM OXIDE 400 (241.3 MG) MG PO TABS
400.0000 mg | ORAL_TABLET | Freq: Every day | ORAL | Status: DC
  Administered 2015-02-18: 400 mg via ORAL
  Filled 2015-02-18 (×2): qty 1

## 2015-02-18 MED ORDER — PREDNISONE 20 MG PO TABS
20.0000 mg | ORAL_TABLET | Freq: Every day | ORAL | Status: DC
  Administered 2015-02-18: 20 mg via ORAL
  Filled 2015-02-18 (×2): qty 1

## 2015-02-18 MED ORDER — PANTOPRAZOLE SODIUM 40 MG PO TBEC
40.0000 mg | DELAYED_RELEASE_TABLET | Freq: Every day | ORAL | Status: DC
  Administered 2015-02-18: 40 mg via ORAL
  Filled 2015-02-18: qty 1

## 2015-02-18 MED ORDER — ATORVASTATIN CALCIUM 20 MG PO TABS
20.0000 mg | ORAL_TABLET | Freq: Every day | ORAL | Status: DC
  Administered 2015-02-18: 20 mg via ORAL
  Filled 2015-02-18 (×2): qty 1

## 2015-02-18 MED ORDER — ENSURE ENLIVE PO LIQD
237.0000 mL | Freq: Three times a day (TID) | ORAL | Status: DC
  Administered 2015-02-18: 237 mL via ORAL
  Filled 2015-02-18: qty 237

## 2015-02-18 NOTE — ED Notes (Signed)
EEG at bedside.

## 2015-02-18 NOTE — Progress Notes (Signed)
LVAD Coordinator Note:   Checked on patient in ED. Currently sleeping and wife has gone home. VAD parameters stable. Awaiting bed still.

## 2015-02-18 NOTE — Consult Note (Signed)
Neurology Consultation Reason for Consult: Speech difficulties Referring Physician: Roxanne Mins, D  CC: Speech difficulties  History is obtained from: Patient, wife  HPI: Andre Tran is a 54 y.o. male with a history of left ventricular assist device, adult still's disease, previous infarct with right hemiparesis. He presents with aphasia and right-sided weakness was relatively sudden in onset. His wife states that he was doing his insulin, but seemed confused and she tried to take over and he began speaking nonsensically repeating numbers.  He states that he just felt like she was doing it too quickly. On arrival, he still had difficulty with speech and had some perseveration. Code stroke was called, but was not TPA candidate due to anticoagulation.  He has since had marked improvement.  After discussing with his wife, he does have transient staring spells from time to time, but has never had anything like his current episode.  In May, he had a spontaneous ICH in the setting of supratherapeutic INR.   ROS: A 14 point ROS was performed and is negative except as noted in the HPI.   Past Medical History  Diagnosis Date  . Paroxysmal ventricular tachycardia 2005, 2010    s/p AICD '05, replaced w/ St. Jude's in 2010 (PVT/V.Fib arrest requiring ICD implant in 2005)  . HYPERTENSION, UNSPECIFIED   . HYPERLIPIDEMIA-MIXED   . 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 Nonischemic cardiomyopathy     2/2 Adriamycin administration for lymphoma  . CHF (congestive heart failure)     EF 15% by echo 2009; s/p St. Jude ICD  . Diabetes mellitus     Type 2  . Cancer 1992, 2007     non hodkins lymphoma 1992, Hodgkins 2007  . Depression   . LV (left ventricular) mural thrombus 2009    2009, on coumadin  . Small bowel obstruction 2001    s/p Small bowel resection 2001  . Jejunal intussusception  2008    2008  . 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 Splenic mass 2009    Noted on Abd [place] 2009 w/ recs for f/u CT - not done  . Hypotension   . Noncompliance with medication regimen   . AICD (automatic cardioverter/defibrillator) present 2005, 2010  . Presence of permanent cardiac pacemaker   . Renal insufficiency     Family History: No hx sz  Social History: Tob: denies  Exam: Current vital signs: BP   Temp(Src) 98.3 F (36.8 C)  Resp 18  SpO2 100% Vital signs in last 24 hours: Temp:  [98.3 F (36.8 C)] 98.3 F (36.8 C) (08/10 0333) Resp:  [18] 18 (08/10 0341) SpO2:  [100 %] 100 % (08/10 0341)   Physical Exam  Constitutional: Appears well-developed and well-nourished.  Psych: Affect appropriate to situation Eyes: No scleral injection HENT: No OP obstrucion Head: Normocephalic.  Cardiovascular: Normal rate and regular rhythm.  Respiratory: Effort normal and breath sounds normal to anterior ascultation GI: Soft.  No distension. There is no tenderness.  Skin: WDI  Neuro: Mental Status: Patient is awake, alert, oriented to person, place, month, year, and situation. Patient is able to give a clear and coherent history. No signs of aphasia or neglect Cranial Nerves: II: Visual Fields are full. Pupils are equal, round, and reactive to light.   III,IV, VI: EOMI without ptosis or diploplia.  V: Facial sensation is symmetric to temperature VII: Facial movement is very mildly weak on the right.  VIII: hearing is intact to voice X: Uvula elevates symmetrically XI: Shoulder shrug is symmetric. XII: tongue is midline without atrophy or fasciculations.  Motor: Tone is increased on the right. He has a right hemiparesis.  Sensory: Sensation is decreased on the right to pin Cerebellar: He has some difficulty on the right, seems consistent with degree of weakness         I have reviewed labs in epic and the results pertinent  to this consultation are: Elevated glucose  I have reviewed the images obtained: CT head-no acute findings, and chronic infarct  Impression: 54 year old male with left ventricular assist device and transient aphasia and worsening of his right-sided weakness. I feel that especially given the results of performed body is most likely explanation would be that this represented TIA.  The history of staring spells is concerning for possible partial seizures, but I don't think that a description alone is enough to be certain of this. Certainly I think that TIA is more likely an excavation of his current event, but EEG would be reasonable as well.  Recommendations: 1) evaluation of LVAD per cardiology 2) continue anticoagulation 3) EEG 4) lipid panel   Roland Rack, MD [facility] [phone]  If 7pm- 7am, please page neurology on call as listed in [place].

## 2015-02-18 NOTE — Progress Notes (Signed)
Bedside EEG completed ing the ED; results pending.

## 2015-02-18 NOTE — Evaluation (Signed)
Clinical/Bedside Swallow Evaluation Patient Details  Name: Andre Tran MRN: [id number] Date of Birth: 21-Dec-1960  Today's Date: 02/13/2015 Time: SLP Start Time (ACUTE ONLY): 1110 SLP Stop Time (ACUTE ONLY): 1125 SLP Time Calculation (min) (ACUTE ONLY): 15 min  Past Medical History:  Past Medical History  Diagnosis Date  . Paroxysmal ventricular tachycardia 2005, 2010    s/p AICD '05, replaced w/ St. Jude's in 2010 (PVT/V.Fib arrest requiring ICD implant in 2005)  . HYPERTENSION, UNSPECIFIED   . HYPERLIPIDEMIA-MIXED   . 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 Nonischemic cardiomyopathy     2/2 Adriamycin administration for lymphoma  . CHF (congestive heart failure)     EF 15% by echo 2009; s/p St. Jude ICD  . Diabetes mellitus     Type 2  . Cancer 1992, 2007     non hodkins lymphoma 1992, Hodgkins 2007  . Depression   . LV (left ventricular) mural thrombus 2009    2009, on coumadin  . Small bowel obstruction 2001    s/p Small bowel resection 2001  . Jejunal intussusception 2008    2008  . 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 Splenic mass 2009    Noted on Abd [place] 2009 w/ recs for f/u CT - not done  . Hypotension   . Noncompliance with medication regimen   . AICD (automatic cardioverter/defibrillator) present 2005, 2010  . Presence of permanent cardiac pacemaker   . Renal insufficiency    Past Surgical History:  Past Surgical History  Procedure Laterality Date  . Cardiac defibrillator placement      2005, replaced w/ St. Jude's 2010  . Vasectomy    . Bowel resection      small bowell 2/2 obstruction 2001  . Pacemaker insertion    . Insert / replace / remove pacemaker    . Colonoscopy N/A 09/18/2014    Procedure: COLONOSCOPY;  Surgeon: [name], MD;  Location: [facility] ENDOSCOPY;  Service: Endoscopy;  Laterality: N/A;  .  Esophagogastroduodenoscopy N/A 09/18/2014    Procedure: ESOPHAGOGASTRODUODENOSCOPY (EGD);  Surgeon: [name], MD;  Location: [facility] ENDOSCOPY;  Service: Endoscopy;  Laterality: N/A;  . Left and right heart catheterization with coronary angiogram N/A 09/19/2014    Procedure: LEFT AND RIGHT HEART CATHETERIZATION WITH CORONARY ANGIOGRAM;  Surgeon: Jolaine Artist, MD;  Location: [facility] CATH LAB;  Service: Cardiovascular;  Laterality: N/A;  . Colonoscopy N/A 09/19/2014    Procedure: COLONOSCOPY;  Surgeon: [name], MD;  Location: [place];  Service: Gastroenterology;  Laterality: N/A;  . Insertion of implantable left ventricular assist device N/A 10/13/2014    Procedure: INSERTION OF IMPLANTABLE LEFT VENTRICULAR ASSIST DEVICE;  Surgeon: [name], MD;  Location: [place];  Service: Open Heart Surgery;  Laterality: N/A;  CIRC ARREST  NITRIC OXIDE  . Tee without cardioversion N/A 10/13/2014    Procedure: TRANSESOPHAGEAL ECHOCARDIOGRAM (TEE);  Surgeon: [name], MD;  Location: [place];  Service: Open Heart Surgery;  Laterality: N/A;  . Exploration post operative open heart N/A 10/13/2014    Procedure: EXPLORATION POST OPERATIVE OPEN HEART;  Surgeon: [name], MD;  Location: [place] OR;  Service: Open Heart Surgery;  Laterality: N/A;  . Cardioversion N/A 01/30/2015    Procedure: CARDIOVERSION;  Surgeon: Jolaine Artist, MD;  Location: [place];  Service: Cardiovascular;  Laterality: N/A;   HPI:  54 year old male with left ventricular assist device and transient aphasia and worsening of his right-sided weakness. CT shows no acute finding. TIA suspected. No notes regarding history of dysphagia.    Assessment / Plan / Recommendation Clinical Impression  Pt demonstrates normal swallow function. Pt and wife deny any history of dysphagia. No SLP f/u needed for swallow. May benefit from f/u SLP for language as receptive langauge is impaired from baseline.     Aspiration Risk  Mild    Diet  Recommendation Age appropriate regular solids;Thin   Medication Administration: Whole meds with liquid    Other  Recommendations     Follow Up Recommendations       Frequency and Duration        Pertinent Vitals/Pain NA    SLP Swallow Goals     Swallow Study Prior Functional Status       General Other Pertinent Information: 54 year old male with left ventricular assist device and transient aphasia and worsening of his right-sided weakness. CT shows no acute finding. TIA suspected. No notes regarding history of dysphagia.  Type of Study: Bedside swallow evaluation Previous Swallow Assessment: none Diet Prior to this Study: NPO Temperature Spikes Noted: No Respiratory Status: Room air History of Recent Intubation: No Behavior/Cognition: Alert;Cooperative;Pleasant mood Oral Cavity - Dentition: Missing dentition Self-Feeding Abilities: Able to feed self Patient Positioning: Upright in bed Baseline Vocal Quality: Normal Volitional Cough: Strong Volitional Swallow: Able to elicit    Oral/Motor/Sensory Function Overall Oral Motor/Sensory Function: Appears within functional limits for tasks assessed   Ice Chips     Thin Liquid Thin Liquid: Within functional limits    Nectar Thick     Honey Thick     Puree Puree: Within functional limits   Solid   GO    Solid: Within functional limits      [facility], MA CCC-SLP [phone]  Asser Lucena, Katherene Ponto 02/19/2015,11:33 AM

## 2015-02-18 NOTE — ED Notes (Signed)
meds ordered from pharmacy.

## 2015-02-18 NOTE — Progress Notes (Signed)
Accompanied him to new 2H26 room from ED with LVAD equipment. Tolerated transport well.

## 2015-02-18 NOTE — ED Notes (Signed)
Called speech therapy for ETA on swallow eval.

## 2015-02-18 NOTE — ED Notes (Signed)
Code Stroke called @0055 .

## 2015-02-18 NOTE — ED Notes (Signed)
Per pt's family at bedside, pt w/ onset of confusion and fatigue that began approx 17:30 yesterday evening, pt is an LVAD pt and recently switched to eliquis.

## 2015-02-18 NOTE — Progress Notes (Signed)
STROKE TEAM PROGRESS NOTE   HISTORY Andre Tran is a 54 y.o. male with a history of left ventricular assist device, adult still's disease, previous infarct with right hemiparesis. He presents with aphasia and right-sided weakness was relatively sudden in onset. His wife states that he was doing his insulin, but seemed confused and she tried to take over and he began speaking nonsensically repeating numbers. He states that he just felt like she was doing it too quickly. On arrival, he still had difficulty with speech and had some perseveration. Code stroke was called, but was not TPA candidate due to anticoagulation. He has since had marked improvement. After discussing with his wife, he does have transient staring spells from time to time, but has never had anything like his current episode. In May, he had a spontaneous ICH in the setting of supratherapeutic INR.  He is to be admitted for further evaluation and treatment.   SUBJECTIVE (INTERVAL HISTORY) His wife is at the bedside in the ED. They are waiting for a bed.  Overall he feels his condition is stable.    OBJECTIVE Temp:  [98.3 F (36.8 C)] 98.3 F (36.8 C) (08/10 0333) Pulse Rate:  [78] 78 (08/10 0827) Cardiac Rhythm:  [-]  Resp:  [16-18] 17 (08/10 1027) SpO2:  [99 %-100 %] 99 % (08/10 0827)   Recent Labs Lab 02/15/15 1147 02/15/15 1636 02/15/15 2133 02/16/15 0758 02/16/15 1127  GLUCAP 220* 256* 350* 217* 362*    Recent Labs Lab 02/13/15 0612 02/14/15 0240 02/15/15 0410 02/16/15 0305 02/25/2015 0128 02/19/2015 0130  NA 132* 132* 130* 128* 134* 132*  K 4.4 4.6 5.1 4.4 4.6 4.6  CL 98* 94* 94* 92* 96* 96*  CO2 24 26 25 27 26   --   GLUCOSE 132* 144* 196* 277* 338* 346*  BUN 19 21* 27* 22* 21* 25*  CREATININE 1.10 1.16 1.04 1.01 1.02 0.70  CALCIUM 9.0 9.0 9.3 9.2 9.7  --     Recent Labs Lab 02/17/2015 0128  AST 76*  ALT 104*  ALKPHOS 216*  BILITOT 0.6  PROT 7.1  ALBUMIN 3.2*    Recent Labs Lab  02/13/15 0612 02/14/15 0240 02/15/15 0410 02/16/15 0305 02/17/2015 0128 03/06/2015 0130  WBC 15.6* 12.2* 8.9 9.7 15.3*  --   NEUTROABS  --   --   --   --  13.2*  --   HGB 9.3* 9.5* 11.5* 9.1* 9.6* 10.9*  HCT 28.0* 28.6* 32.8* 27.2* 28.8* 32.0*  MCV 85.9 86.9 85.0 84.7 86.2  --   PLT 356 358 337 353 377  --    No results for input(s): CKTOTAL, CKMB, CKMBINDEX, TROPONINI in the last 168 hours.  Recent Labs  02/13/2015 0128  LABPROT 19.3*  INR 1.62*   No results for input(s): COLORURINE, LABSPEC, PHURINE, GLUCOSEU, HGBUR, BILIRUBINUR, KETONESUR, PROTEINUR, UROBILINOGEN, NITRITE, LEUKOCYTESUR in the last 72 hours.  Invalid input(s): APPERANCEUR     Component Value Date/Time   CHOL 106 06/21/2014 0526   TRIG 61 06/21/2014 0526   HDL 36* 06/21/2014 0526   CHOLHDL 2.9 06/21/2014 0526   VLDL 12 06/21/2014 0526   LDLCALC 58 06/21/2014 0526   Lab Results  Component Value Date   HGBA1C 5.5 12/24/2014      Component Value Date/Time   LABOPIA NEGATIVE 04/18/2007 1930   COCAINSCRNUR NEGATIVE 04/18/2007 1930   LABBENZ NEGATIVE 04/18/2007 1930   AMPHETMU NEGATIVE 04/18/2007 1930     Recent Labs Lab 03/01/2015 0423  ETH <5  IMAGING  Ct Head Wo Contrast  02/12/2015   CLINICAL DATA:  Acute onset of right-sided weakness. Code stroke. Initial encounter.  EXAM: CT HEAD WITHOUT CONTRAST  TECHNIQUE: Contiguous axial images were obtained from the base of the skull through the vertex without intravenous contrast.  COMPARISON:  CT of the head performed 02/03/2015  FINDINGS: There is no evidence of acute infarction, mass lesion, or intra- or extra-axial hemorrhage on CT.  Chronic infarct is noted at the left parietal lobe and left basal ganglia, with associated encephalomalacia. There is ex vacuo dilatation of the left lateral ventricle. A small chronic infarct is noted at the posterior aspect of the right temporal lobe. Mild to moderate cortical volume loss is noted.  The brainstem and fourth  ventricle are within normal limits. No mass effect or midline shift is seen.  There is no evidence of fracture; visualized osseous structures are unremarkable in appearance. The visualized portions of the orbits are within normal limits. The paranasal sinuses and mastoid air cells are well-aerated. No significant soft tissue abnormalities are seen.  IMPRESSION: 1. No acute intracranial pathology seen on CT. 2. Chronic infarct at the left frontal lobe and left basal ganglia, with associated encephalomalacia. Ex vacuo dilatation of the left lateral ventricle. 3. Small chronic infarct at the posterior aspect of the right temporal lobe. 4. Mild to moderate cortical volume loss noted.  These results were called by telephone at the time of interpretation on 02/16/2015 at 1:22 am to Dr. Leonel Ramsay, who verbally acknowledged these results.   Electronically Signed   By: Garald Balding M.D.   On: 02/11/2015 05:05   EEG this is an abnormal EEG with findings supportive of no specific mild global cerebral dysfunction. Please, be aware that the absence of epileptiform discharges do not exclude the possibility of epilepsy.     PHYSICAL EXAM Pleasant middle aged african [place] male not in distress. . Afebrile. Head is nontraumatic. Neck is supple without bruit.    Cardiac exam no murmur or gallop. Lungs are clear to auscultation. Distal pulses are well felt. Neurological Exam :  Awake alert aphasic expressive mainly with nonfluent speech and short sentences only with word finding difficulty. Good comprehension. Can name and repeat.etraocular movements are full range. No nystagmus.Rt lower face weakness. Tongue midline.mild right hemiparesis 4/5 increased tone on right. Rt grip weakness. Good antigravity movements on left side. Decrease sensation right  hemibody. Plantars both equivocal. ASSESSMENT/PLAN [name] is a 54 y.o. male with history of LVAD, Still's disease, previous infarct and ICH presenting with  speech difficulties, confusion and worsening R hemiparesis. He did not receive IV t-PA due to being on anticoagulation.   Transient aphasia, worsening R hemiparesis, encephalopathy. Possible Stroke, TIA.   CT head unremarkable for acute pathology  Repeat CT 8/11 to confirm stroke  No MRI  Due to LVAD  Carotid Doppler  No ordered  2D Echo  01/26/2015 LVEF severely impaired at 25-30%, AV not opening, trivial regurd. Septal bowing. RV function Normal. 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 I have made any additions or clarifications directly to the above note. Agree with note above. He has presented with aphasia and right hemiparesis.due to to likely cardioembolic stroke despite anticoagulation and remains at risk for neurological worsening, recurrent stroke/TIAs and needs ongoing stroke evaluation and aggressive  risk factor modification. Long d/w patient and wife and answered questions.   Antony Contras, MD Medical Director [facility] Pager: [phone] 02/16/2015 11:04 PM    To contact Stroke Continuity provider, please refer to [email or web address] After hours, contact General Neurology

## 2015-02-18 NOTE — Progress Notes (Addendum)
LVAD Coordinator ED Note:   Page received from Andre Tran's wife re: new onset and worsening dysphasia, intermittent inability to recognize family members, inappropriate word responses and facial grimace. Displayed right arm drift when instructed; facial droop and drift may be his baseline with previous CVA, however new onset cognitive changes worrisome for CVA. Recently D/Cd from extended hospitalization and was changed to [place] for anticoagulation management. She reports that he has never had an episode like this despite his baseline neurocognitive status.   LVAD numbers read to me over the phone and are all baseline  Flow: 4.4Speed: 8400 PI: 6.8 Power 4.6w.   When asked when last seen normal was she was very unsure but reported to me 4 pm, however after further discussion with family she determined 10:30 PM was LSW and Code Stroke was called.   Vital Signs: HR: not currently on tele and receiving EEG.  Doppler MAP: 76 - 86 Automatic Cuff: 101/78 (84) RR: 16 - 18 POx: 100% RA  LVAD Interrogation: Flow: 4.0 Speed: 8400 PI: 6.8 Power: 4.2  Alarms: none Events: 0- 5 daily--having much less PI events with lower speed setting.   Fixed Speed: 8400 Back Up Speed: 8000 (<8k causes regurgitant flow)  Encounter Details: Upon asking him simple questions he only responds to me "[name]" despite asking for year, what he ate for dinner, where he was right now, and even who his wife was. His recall is much slower than normal and searching for words is worse than baseline. He is unable to recall my name however appears to recognize my face. He needs frequent reminding and reorientation about what is going on (EEG testing). Grips baseline (weaker on R side). Unable to perform equipment power source changes at this time.   LVAD appears to be functioning normal and improved interrogation for PI events/speed drops on lower speed. Was recently changed to apixaban from warfarin with his  complicated history of bleeding/clotting ([place] in May, Renal Infarct recently). He is currently not on ASA as we stopped it in May with ICH. Will discuss with medical team about possibly reintroducing antiplatelet therapy in light of this acute event and any other changes to be made for anticoagulation, as he is complicated. Lipid panel pending. Blood glucose > 300.   Neurology assessment of CT was negative for new ischemic changes, so TIA vs. Seizure activity (wife reports staring spells). EEG in process. Will d/w Dr. Haroldine Laws further about any other possible studies (carotid dopplers, CTA head/neck or repeat f/u CT of head--although he has had several scans recently). Last carotid dopplers were 06/2014 showing 1-39% B/L ICA stenosis.  Changed to batteries for EEG to minimize any possible interference from equipment. Awaiting bed to be cleaned on 2H at this time.    [name], RN VAD Coordinator   Office: [phone] 24/7 VAD Pager: [phone]

## 2015-02-18 NOTE — H&P (Signed)
VAD TEAM History & Physical Note   Reason for Admission: Aphasia   HPI:    Andre Tran is a 54 y.o. 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 Underwent HMII LVAD placement 10/13/2014   Admitted 5/16 with headache and CT showed spontaneous intracerebral bleed with INR 3.1.   Admitted from clinic on 7/20 with recurrent fevers and 30 pound weight loss. Discharged on 8/8 While in hospital underwent extensive w/u as outlined below. ID and the Oncology services were consulted. Wit help from the ID team, we also discussed the case with Dr. Amil Amen in Rheumatology by telephone. There was no evidence of infection or malignancy. Eventually diagnosed with Adult Still's disease and started on prednisone 20 daily with marked improvement. While in hospital also found to have renal infarct and small renal mass in left kidney (exophytic but too small to biopsy). Given recent intracranial bleed and then renal infarct on therapeutic dosing of warfarin decision was made to switch to Eliquis. While in the hospital TEE showed improvement of LV function with EF 35-40%. With multiple PI events VAD speed turned down.   He presented to ER after calling VAD pager with aphasia and right-sided weakness which was relatively sudden in onset. His wife states that he was doing his insulin, but seemed confused and she tried to take over and he began speaking nonsensically repeating numbers.  He states that he just felt like she was doing it too quickly. On arrival to ER, he still had difficulty with speech and had some perseveration. Code stroke was called, but was not TPA candidate due to anticoagulation. Head CT unremarkable.  LDH 269. Denies power spikes or other alarms on VAD  He has since had marked improvement but still with word finding difficulty. Neurology questioning TIA vs seizure.    LVAD INTERROGATION:  HeartMate II LVAD:  Flow speed 4.0  8400 liters/min,, power 4.0, PI 5.9  Occasional PI event   Review of Systems: [y] = yes, [ ]  = no   General: Weight gain [ ] ; Weight loss Blue.Reese ]; Anorexia Blue.Reese ]; Fatigue Blue.Reese ]; Fever [ ] ; Chills [ ] ; Weakness Blue.Reese ]  Cardiac: Chest pain/pressure [ ] ; Resting SOB [ ] ; Exertional SOB [ ] ; Orthopnea [ ] ; Pedal Edema [ ] ; Palpitations [ ] ; Syncope [ ] ; Presyncope [ ] ; Paroxysmal nocturnal dyspnea[ ]   Pulmonary: Cough [ ] ; Wheezing[ ] ; Hemoptysis[ ] ; Sputum [ ] ; Snoring [ ]   GI: Vomiting[ ] ; Dysphagia[ ] ; Melena[ ] ; Hematochezia [ ] ; Heartburn[ ] ; Abdominal pain [ ] ; Constipation [ ] ; Diarrhea [ ] ; BRBPR [ ]   GU: Hematuria[ ] ; Dysuria [ ] ; Nocturia[ ]   Vascular: Pain in legs with walking [ ] ; Pain in feet with lying flat [ ] ; Non-healing sores [ ] ; Stroke Blue.Reese ]; TIA Blue.Reese ]; Slurred speech [ ] ;  Neuro: Headaches[ ] ; Vertigo[ ] ; Seizures[ ] ; Paresthesias[ ] ;Blurred vision [ ] ; Diplopia [ ] ; Vision changes [ ]   Ortho/Skin: Arthritis Blue.Reese ]; Joint pain Blue.Reese ]; Muscle pain [ ] ; Joint swelling Blue.Reese ]; Back Pain [ ] ; Rash [ ]   Psych: Depression[ ] ; Anxiety[ ]   Heme: Bleeding problems [ ] ; Clotting disorders [ ] ; Anemia [ ]   Endocrine: Diabetes [ y]; Thyroid dysfunction[ ]   Home Medications Prior to Admission medications   Medication Sig Start Date End Date Taking? Authorizing Provider  acetaminophen (TYLENOL) 650 MG CR tablet Take 1 tablet (650 mg total) by mouth every 8 (eight) hours as needed for pain. 12/19/14  Yes Alexa Sherral Hammers, MD  albuterol (PROVENTIL HFA;VENTOLIN HFA) 108 (90 BASE) MCG/ACT inhaler Inhale 1-2 puffs into the lungs every 6 (six) hours as needed for wheezing or shortness of breath.   Yes Historical Provider, MD  amiodarone (PACERONE) 200 MG tablet Take 1 tablet (200 mg  total) by mouth daily. 10/24/14  Yes Amy D Clegg, NP  amLODipine (NORVASC) 2.5 MG tablet Take 1 tablet (2.5 mg total) by mouth daily. 02/16/15  Yes Shirley Friar, PA-C  apixaban (ELIQUIS) 5 MG TABS tablet Take 1 tablet (5 mg total) by mouth 2 (two) times daily. 02/16/15  Yes Shirley Friar, PA-C  atorvastatin (LIPITOR) 40 MG tablet Take 0.5 tablets (20 mg total) by mouth at bedtime. Patient taking differently: Take 20 mg by mouth daily.  09/04/14  Yes Larey Dresser, MD  bisacodyl (DULCOLAX) 5 MG EC tablet Take 1 tablet (5 mg total) by mouth daily as needed for moderate constipation. 09/04/14  Yes Larey Dresser, MD  insulin glargine (LANTUS) 100 UNIT/ML injection Inject 0.05 mLs (5 Units total) into the skin at bedtime. 10/24/14  Yes Amy D Ninfa Meeker, NP  Magnesium 400 MG TABS Take 1 tablet by mouth daily with breakfast. 10/30/14  Yes Amy D Clegg, NP  Nutritional Supplements (ENSURE NUTRITION SHAKE) LIQD Take 1 Bottle by mouth 2 (two) times daily at 10 AM and 5 PM. Patient taking differently: Take 1 Bottle by mouth 3 (three) times daily.  01/26/15  Yes Shaune Pascal Bensimhon, MD  pantoprazole (PROTONIX) 40 MG tablet Take 1 tablet (40 mg total) by mouth daily. 10/24/14  Yes Amy D Ninfa Meeker, NP  predniSONE (DELTASONE) 20 MG tablet Take 1 tablet (20 mg total) by mouth daily with breakfast. 02/16/15  Yes Shirley Friar, PA-C  traMADol (ULTRAM) 50 MG tablet Take 1 tablet (50 mg total) by mouth every 12 (twelve) hours as needed for moderate pain or severe pain. 02/16/15  Yes Shirley Friar, PA-C    Past Medical History: Past Medical History  Diagnosis Date  . Paroxysmal ventricular tachycardia 2005, 2010    s/p AICD '05, replaced w/ St. Jude's in 2010 (PVT/V.Fib arrest requiring ICD implant in 2005)  . HYPERTENSION, UNSPECIFIED   . HYPERLIPIDEMIA-MIXED   . CVA     2008 Right basal ganglia infarct, TPA and unsuccessful attempt at clot retrieval with hemorrhagic conversion, residual  right-sided weakness  . Nonischemic cardiomyopathy     2/2 Adriamycin administration for lymphoma  . CHF (congestive heart failure)     EF 15% by echo 2009; s/p St. Jude ICD  . Diabetes mellitus     Type 2  . Cancer 1992, 2007     non hodkins lymphoma 1992, Hodgkins 2007  . Depression   . LV (left ventricular) mural thrombus 2009    2009, on coumadin  . Small bowel obstruction 2001    s/p Small bowel resection 2001  . Jejunal intussusception 2008    2008  . 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 Splenic mass 2009    Noted on Abd [place] 2009 w/ recs for f/u CT - not done  . Hypotension   .  Noncompliance with medication regimen   . AICD (automatic cardioverter/defibrillator) present 2005, 2010  . Presence of permanent cardiac pacemaker   . Renal insufficiency     Past Surgical History: Past Surgical History  Procedure Laterality Date  . Cardiac defibrillator placement      2005, replaced w/ St. Jude's 2010  . Vasectomy    . Bowel resection      small bowell 2/2 obstruction 2001  . Pacemaker insertion    . Insert / replace / remove pacemaker    . Colonoscopy N/A 09/18/2014    Procedure: COLONOSCOPY;  Surgeon: [name], MD;  Location: [facility] ENDOSCOPY;  Service: Endoscopy;  Laterality: N/A;  . Esophagogastroduodenoscopy N/A 09/18/2014    Procedure: ESOPHAGOGASTRODUODENOSCOPY (EGD);  Surgeon: [name], MD;  Location[facility] ENDOSCOPY;  Service: Endoscopy;  Laterality: N/A;  . Left and right heart catheterization with coronary angiogram N/A 09/19/2014    Procedure: LEFT AND RIGHT HEART CATHETERIZATION WITH CORONARY ANGIOGRAM;  Surgeon: Jolaine Artist, MD;  Location: [facility] CATH LAB;  Service: Cardiovascular;  Laterality: N/A;  . Colonoscopy N/A 09/19/2014    Procedure: COLONOSCOPY;  Surgeon: [name], MD;  Location: [place];  Service: Gastroenterology;  Laterality: N/A;  . Insertion of implantable left ventricular assist device N/A 10/13/2014      Procedure: INSERTION OF IMPLANTABLE LEFT VENTRICULAR ASSIST DEVICE;  Surgeon: [name], MD;  Location: [facility];  Service: Open Heart Surgery;  Laterality: N/A;  CIRC ARREST  NITRIC OXIDE  . Tee without cardioversion N/A 10/13/2014    Procedure: TRANSESOPHAGEAL ECHOCARDIOGRAM (TEE);  Surgeon: [name], MD;  Location: [place];  Service: Open Heart Surgery;  Laterality: N/A;  . Exploration post operative open heart N/A 10/13/2014    Procedure: EXPLORATION POST OPERATIVE OPEN HEART;  Surgeon: [name], MD;  Location: [place] OR;  Service: Open Heart Surgery;  Laterality: N/A;  . Cardioversion N/A 01/30/2015    Procedure: CARDIOVERSION;  Surgeon: Jolaine Artist, MD;  Location: [facility] OR;  Service: Cardiovascular;  Laterality: N/A;    Family History: Family History  Problem Relation Age of Onset  . Other      No known family h/o heart disease  . Heart disease    . Diabetes Mother   . Other Other     complications from hip replacement    Social History: Social History   Social History  . Marital Status: Married    Spouse Name: N/A  . Number of Children: N/A  . Years of Education: N/A   Social History Main Topics  . Smoking status: Never Smoker   . Smokeless tobacco: Never Used  . Alcohol Use: 1.2 oz/week    1 Cans of beer, 1 Shots of liquor per week     Comment: ON SPECIAL OCCASIONS  . Drug Use: No  . Sexual Activity: No   Other Topics Concern  . None   Social History Narrative    Allergies:  Allergies  Allergen Reactions  . Hydralazine Other (See Comments)    Severe headaches     Objective:    Vital Signs:   Temp:  [98.3 F (36.8 C)] 98.3 F (36.8 C) (08/10 0333) Resp:  [18] 18 (08/10 0341) SpO2:  [100 %] 100 % (08/10 0341)   Mean arterial Pressure 80s  Physical Exam: General: Sitting in chair Well appearing. No resp difficulty HEENT: normal Neck: supple. JVP 7. Carotids 2+ bilat; no bruits. No lymphadenopathy or thryomegaly appreciated. Cor:  Mechanical  heart sounds with LVAD hum present. Lungs: clear Abdomen: soft, nontender, nondistended. No hepatosplenomegaly. No bruits or masses. Good bowel sounds. Driveline: C/D/I; securement device intact and driveline incorporated Extremities: no cyanosis, clubbing, rash, edema Neuro: alert . Moves all 4 extremities. Trouble with word fidning   Telemetry: NSRGeneral: Sitting in chair Well appearing. No resp difficulty     Labs: Basic Metabolic Panel:  Recent Labs Lab 02/13/15 0612 02/14/15 0240 02/15/15 0410 02/16/15 0305 03/11/2015 0128 03/04/2015 0130  NA 132* 132* 130* 128* 134* 132*  K 4.4 4.6 5.1 4.4 4.6 4.6  CL 98* 94* 94* 92* 96* 96*  CO2 24 26 25 27 26   --   GLUCOSE 132* 144* 196* 277* 338* 346*  BUN 19 21* 27* 22* 21* 25*  CREATININE 1.10 1.16 1.04 1.01 1.02 0.70  CALCIUM 9.0 9.0 9.3 9.2 9.7  --     Liver Function Tests:  Recent Labs Lab 02/21/2015 0128  AST 76*  ALT 104*  ALKPHOS 216*  BILITOT 0.6  PROT 7.1  ALBUMIN 3.2*   No results for input(s): LIPASE, AMYLASE in the last 168 hours. No results for input(s): AMMONIA in the last 168 hours.  CBC:  Recent Labs Lab 02/13/15 0612 02/14/15 0240 02/15/15 0410 02/16/15 0305 02/25/2015 0128 03/02/2015 0130  WBC 15.6* 12.2* 8.9 9.7 15.3*  --   NEUTROABS  --   --   --   --  13.2*  --   HGB 9.3* 9.5* 11.5* 9.1* 9.6* 10.9*  HCT 28.0* 28.6* 32.8* 27.2* 28.8* 32.0*  MCV 85.9 86.9 85.0 84.7 86.2  --   PLT 356 358 337 353 377  --     Cardiac Enzymes: No results for input(s): CKTOTAL, CKMB, CKMBINDEX, TROPONINI in the last 168 hours.  BNP: BNP (last 3 results)  Recent Labs  11/05/14 1030 12/19/14 1115 01/26/15 1045  BNP 118.8* 82.3 63.0    ProBNP (last 3 results)  Recent Labs  06/12/14 1415 06/18/14 1200  PROBNP 2079.0* 2144.0*     CBG:  Recent Labs Lab 02/15/15 1147 02/15/15 1636 02/15/15 2133 02/16/15 0758 02/16/15 1127  GLUCAP 220* 256* 350* 217* 362*    Coagulation  Studies:  Recent Labs  02/28/2015 0128  LABPROT 19.3*  INR 1.62*    Other results: EKG: pending  Imaging: Ct Head Wo Contrast  02/23/2015   CLINICAL DATA:  Acute onset of right-sided weakness. Code stroke. Initial encounter.  EXAM: CT HEAD WITHOUT CONTRAST  TECHNIQUE: Contiguous axial images were obtained from the base of the skull through the vertex without intravenous contrast.  COMPARISON:  CT of the head performed 02/03/2015  FINDINGS: There is no evidence of acute infarction, mass lesion, or intra- or extra-axial hemorrhage on CT.  Chronic infarct is noted at the left parietal lobe and left basal ganglia, with associated encephalomalacia. There is ex vacuo dilatation of the left lateral ventricle. A small chronic infarct is noted at the posterior aspect of the right temporal lobe. Mild to moderate cortical volume loss is noted.  The brainstem and fourth ventricle are within normal limits. No mass effect or midline shift is seen.  There is no evidence of fracture; visualized osseous structures are unremarkable in appearance. The visualized portions of the orbits are within normal limits. The paranasal sinuses and mastoid air cells are well-aerated. No significant soft tissue abnormalities are seen.  IMPRESSION: 1. No acute intracranial pathology seen on CT. 2. Chronic infarct at the left frontal lobe and left basal ganglia, with associated  encephalomalacia. Ex vacuo dilatation of the left lateral ventricle. 3. Small chronic infarct at the posterior aspect of the right temporal lobe. 4. Mild to moderate cortical volume loss noted.  These results were called by telephone at the time of interpretation on 03/07/2015 at 1:22 am to Dr. Leonel Ramsay, who verbally acknowledged these results.   Electronically Signed   By: Garald Balding M.D.   On: 02/17/2015 05:05      Assessment:   1. Aphasia - likely TIA vs seizure 2. Still's disease 3. Chronic systolic HF s/p HM II VAD 4. Recent ICH 5. H/o  previous CVA 6. H/o lymphoma s/p treatment 7. Recent renal infarct on warfarin 8. DM2  Plan/Discussion:    His symptoms are markedly improved. Suspect TIA but could also be seizure activity. CT negative. Unable to do MRI due to VAD. Obviously concern for pump thrombosis especially with recent renal infarct on CT. However LDH quite low and now power spikes. Have recently changed him to apixaban due to difficulty managing INRs and also ICH with INR in the 3 range followed by renal infarct with therapeutic INR. Will discuss further management with VAD team.   Admit for monitoring. Plan EEG. Appreciate Neuro input.   I reviewed the LVAD parameters from today, and compared the results to the patient's prior recorded data.  No programming changes were made.  The LVAD is functioning within specified parameters.  The patient performs LVAD self-test daily.  LVAD interrogation was negative for any significant power changes, alarms or PI events/speed drops.  LVAD equipment check completed and is in good working order.  Back-up equipment present.   LVAD education done on emergency procedures and precautions and reviewed exit site care.  Length of Stay:   Glori Bickers 02/25/2015, 5:44 AM  VAD Team Pager [phone] (7am - 7am) +++VAD ISSUES ONLY+++ Advanced Heart Failure Team Pager [phone] (M-F; [place])  Please contact [facility] for night-coverage after hours (4p -7a ) and weekends on [email or web address] for all non- LVAD Issues

## 2015-02-18 NOTE — ED Provider Notes (Signed)
CSN: [id number]     Arrival date & time 03/03/2015  0031 History  This chart was scribed for Delora Fuel, MD by Randa Evens, ED Scribe. This patient was seen in room [facility] and the patient's care was started at 2:25 AM.     Chief Complaint  Patient presents with  . Altered Mental Status  . Code Stroke   Patient is a 54 y.o. male presenting with altered mental status. The history is provided by the patient. No language interpreter was used.  Altered Mental Status Presenting symptoms: behavior changes, confusion and disorientation   Severity:  Moderate Most recent episode:  Today Associated symptoms: weakness    HPI Comments: JOEVANNI RODDEY is a 54 y.o. male brought in by ambulance, who presents to the Emergency Department complaining of altered mental status onset 10 PM. Per family member pt became confused followed by right sided weakness, disorientation, and speech difficulty. Pt does have right sided deficits due to previous stroke. Pt has recently started taking elequis.   Past Medical History  Diagnosis Date  . Paroxysmal ventricular tachycardia 2005, 2010    s/p AICD '05, replaced w/ [employer]. Jude's in 2010 (PVT/V.Fib arrest requiring ICD implant in 2005)  . HYPERTENSION, UNSPECIFIED   . HYPERLIPIDEMIA-MIXED   . 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 Nonischemic cardiomyopathy     2/2 Adriamycin administration for lymphoma  . CHF (congestive heart failure)     EF 15% by echo 2009; s/p St. Jude ICD  . Diabetes mellitus     Type 2  . Cancer 1992, 2007     non hodkins lymphoma 1992, Hodgkins 2007  . Depression   . LV (left ventricular) mural thrombus 2009    2009, on coumadin  . Small bowel obstruction 2001    s/p Small bowel resection 2001  . Jejunal intussusception 2008    2008  . 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  Splenic mass 2009    Noted on Abd [place] 2009 w/ recs for f/u CT - not done  . Hypotension   . Noncompliance with medication regimen   . AICD (automatic cardioverter/defibrillator) present 2005, 2010  . Presence of permanent cardiac pacemaker   . Renal insufficiency    Past Surgical History  Procedure Laterality Date  . Cardiac defibrillator placement      2005, replaced w/ St. Jude's 2010  . Vasectomy    . Bowel resection      small bowell 2/2 obstruction 2001  . Pacemaker insertion    . Insert / replace / remove pacemaker    . Colonoscopy N/A 09/18/2014    Procedure: COLONOSCOPY;  Surgeon: [name], MD;  Location: [facility] ENDOSCOPY;  Service: Endoscopy;  Laterality: N/A;  . Esophagogastroduodenoscopy N/A 09/18/2014    Procedure: ESOPHAGOGASTRODUODENOSCOPY (EGD);  Surgeon: [name], MD;  Location: [facility] ENDOSCOPY;  Service: Endoscopy;  Laterality: N/A;  . Left and right heart catheterization with coronary angiogram N/A 09/19/2014    Procedure: LEFT AND RIGHT HEART CATHETERIZATION WITH CORONARY ANGIOGRAM;  Surgeon: Jolaine Artist, MD;  Location[facility] CATH LAB;  Service: Cardiovascular;  Laterality: N/A;  . Colonoscopy N/A 09/19/2014    Procedure: COLONOSCOPY;  Surgeon: [name], MD;  Location: [place];  Service: Gastroenterology;  Laterality: N/A;  . Insertion of implantable left ventricular assist device  N/A 10/13/2014    Procedure: INSERTION OF IMPLANTABLE LEFT VENTRICULAR ASSIST DEVICE;  Surgeon: [name], MD;  Location: [place];  Service: Open Heart Surgery;  Laterality: N/A;  CIRC ARREST  NITRIC OXIDE  . Tee without cardioversion N/A 10/13/2014    Procedure: TRANSESOPHAGEAL ECHOCARDIOGRAM (TEE);  Surgeon: [name], MD;  Location: [place];  Service: Open Heart Surgery;  Laterality: N/A;  . Exploration post operative open heart N/A 10/13/2014    Procedure: EXPLORATION POST OPERATIVE OPEN HEART;  Surgeon: [name], MD;  Location: [place] OR;  Service: Open Heart Surgery;   Laterality: N/A;  . Cardioversion N/A 01/30/2015    Procedure: CARDIOVERSION;  Surgeon: Jolaine Artist, MD;  Location: [facility] OR;  Service: Cardiovascular;  Laterality: N/A;   Family History  Problem Relation Age of Onset  . Other      No known family h/o heart disease  . Heart disease    . Diabetes Mother   . Other Other     complications from hip replacement   Social History  Substance Use Topics  . Smoking status: Never Smoker   . Smokeless tobacco: Never Used  . Alcohol Use: 1.2 oz/week    1 Cans of beer, 1 Shots of liquor per week     Comment: ON SPECIAL OCCASIONS    Review of Systems  Neurological: Positive for speech difficulty and weakness.  Psychiatric/Behavioral: Positive for confusion.  All other systems reviewed and are negative.     Allergies  Hydralazine  Home Medications   Prior to Admission medications   Medication Sig Start Date End Date Taking? Authorizing Provider  acetaminophen (TYLENOL) 650 MG CR tablet Take 1 tablet (650 mg total) by mouth every 8 (eight) hours as needed for pain. 12/19/14   Alexa Sherral Hammers, MD  albuterol (PROVENTIL HFA;VENTOLIN HFA) 108 (90 BASE) MCG/ACT inhaler Inhale 1-2 puffs into the lungs every 6 (six) hours as needed for wheezing or shortness of breath.    Historical Provider, MD  amiodarone (PACERONE) 200 MG tablet Take 1 tablet (200 mg total) by mouth daily. 10/24/14   Amy D Clegg, NP  amLODipine (NORVASC) 2.5 MG tablet Take 1 tablet (2.5 mg total) by mouth daily. 02/16/15   Shirley Friar, PA-C  apixaban (ELIQUIS) 5 MG TABS tablet Take 1 tablet (5 mg total) by mouth 2 (two) times daily. 02/16/15   Shirley Friar, PA-C  atorvastatin (LIPITOR) 40 MG tablet Take 0.5 tablets (20 mg total) by mouth at bedtime. Patient taking differently: Take 20 mg by mouth daily.  09/04/14   Larey Dresser, MD  bisacodyl (DULCOLAX) 5 MG EC tablet Take 1 tablet (5 mg total) by mouth daily as needed for moderate constipation. 09/04/14    Larey Dresser, MD  insulin glargine (LANTUS) 100 UNIT/ML injection Inject 0.05 mLs (5 Units total) into the skin at bedtime. 10/24/14   Amy D Ninfa Meeker, NP  Magnesium 400 MG TABS Take 1 tablet by mouth daily with breakfast. 10/30/14   Amy D Clegg, NP  methocarbamol (ROBAXIN) 500 MG tablet Take 1 tablet (500 mg total) by mouth every 6 (six) hours. Patient not taking: Reported on 01/28/2015 01/13/15   Amy D Ninfa Meeker, NP  Nutritional Supplements (ENSURE NUTRITION SHAKE) LIQD Take 1 Bottle by mouth 2 (two) times daily at 10 AM and 5 PM. 01/26/15   Jolaine Artist, MD  pantoprazole (PROTONIX) 40 MG tablet Take 1 tablet (40 mg total) by mouth daily. 10/24/14  Amy Estrella Deeds, NP  predniSONE (DELTASONE) 20 MG tablet Take 1 tablet (20 mg total) by mouth daily with breakfast. 02/16/15   Shirley Friar, PA-C  traMADol (ULTRAM) 50 MG tablet Take 1 tablet (50 mg total) by mouth every 12 (twelve) hours as needed for moderate pain or severe pain. 02/16/15   Shirley Friar, PA-C   BP   Temp(Src) 98.3 F (36.8 C)  Resp 16  SpO2 100%   Physical Exam  Constitutional: He is oriented to person, place, and time. He appears well-developed and well-nourished. No distress.  HENT:  Head: Normocephalic and atraumatic.  Eyes: Conjunctivae and EOM are normal. Pupils are equal, round, and reactive to light.  Fundi normal.   Neck: Normal range of motion. Neck supple. No JVD present.  Cardiovascular:  Steady hum of LVAD.   Pulmonary/Chest: Effort normal and breath sounds normal. He has no wheezes. He has no rales. He exhibits no tenderness.  Abdominal: Soft. Bowel sounds are normal. He exhibits no distension and no mass. There is no tenderness.  Musculoskeletal: Normal range of motion. He exhibits no edema.  Lymphadenopathy:    He has no cervical adenopathy.  Neurological: He is alert and oriented to person, place, and time.  Right hemiparesis, mild expressive aphasia.   Skin: Skin is warm and dry. No rash noted.   Psychiatric: He has a normal mood and affect. His behavior is normal.  Nursing note and vitals reviewed.   ED Course  Procedures (including critical care time) DIAGNOSTIC STUDIES: Oxygen Saturation is 100% on RA, normal by my interpretation.    COORDINATION OF CARE: 2:29 AM-Discussed treatment plan with family at bedside and family agreed to plan.     Labs Review Results for orders placed or performed during the hospital encounter of 02/15/2015  Ethanol  Result Value Ref Range   Alcohol, Ethyl (B) <5 <5 mg/dL    Imaging Review Ct Head Wo Contrast  03/08/2015   CLINICAL DATA:  Acute onset of right-sided weakness. Code stroke. Initial encounter.  EXAM: CT HEAD WITHOUT CONTRAST  TECHNIQUE: Contiguous axial images were obtained from the base of the skull through the vertex without intravenous contrast.  COMPARISON:  CT of the head performed 02/03/2015  FINDINGS: There is no evidence of acute infarction, mass lesion, or intra- or extra-axial hemorrhage on CT.  Chronic infarct is noted at the left parietal lobe and left basal ganglia, with associated encephalomalacia. There is ex vacuo dilatation of the left lateral ventricle. A small chronic infarct is noted at the posterior aspect of the right temporal lobe. Mild to moderate cortical volume loss is noted.  The brainstem and fourth ventricle are within normal limits. No mass effect or midline shift is seen.  There is no evidence of fracture; visualized osseous structures are unremarkable in appearance. The visualized portions of the orbits are within normal limits. The paranasal sinuses and mastoid air cells are well-aerated. No significant soft tissue abnormalities are seen.  IMPRESSION: 1. No acute intracranial pathology seen on CT. 2. Chronic infarct at the left frontal lobe and left basal ganglia, with associated encephalomalacia. Ex vacuo dilatation of the left lateral ventricle. 3. Small chronic infarct at the posterior aspect of the right  temporal lobe. 4. Mild to moderate cortical volume loss noted.  These results were called by telephone at the time of interpretation on 02/27/2015 at 1:22 am to Dr. Leonel Ramsay, who verbally acknowledged these results.   Electronically Signed   By: Francoise Schaumann.D.  On: 02/25/2015 05:05    CRITICAL CARE Performed by: EHOZY,YQMGN Total critical care time: 50 minutes Critical care time was exclusive of separately billable procedures and treating other patients. Critical care was necessary to treat or prevent imminent or life-threatening deterioration. Critical care was time spent personally by me on the following activities: development of treatment plan with patient and/or surrogate as well as nursing, discussions with consultants, evaluation of patient's response to treatment, examination of patient, obtaining history from patient or surrogate, ordering and performing treatments and interventions, ordering and review of laboratory studies, ordering and review of radiographic studies, pulse oximetry and re-evaluation of patient's condition.  MDM   Final diagnoses:  Aphasia     Patient presented as code stroke, but appears to be having waxing and waning episodes of expressive aphasia. Patient seen in conjunction with Dr. Cloyd Stagers of neurology service. This does not appear to be stroke. It is possible he is having TIAs, possible seizure. He will need to be admitted for further evaluation. He is not a candidate for MRI scan because of LVAD. Case is discussed with Dr.Bensimhon who takes care of the patient for his LVAD, and agrees to admit the patient.   I personally performed the services described in this documentation, which was scribed in my presence. The recorded information has been reviewed and is accurate.       Delora Fuel, MD 00/37/04 [id number]

## 2015-02-18 NOTE — Procedures (Signed)
EEG report.  Brief clinical history:  54 year old male with left ventricular assist device and transient aphasia and worsening of his right-sided weakness, as well as history of staring spells is concerning for possible partial seizures and altered mental status.   Technique: this is a 17 channel routine scalp EEG performed at the bedside with bipolar and monopolar montages arranged in accordance to the international 10/20 system of electrode placement. One channel was dedicated to EKG recording.  The study was performed during wakefulness, drowsiness, and stage 2 sleep. No activating procedures performed.  Description: as the study begins and throughout the entire recording, there is evidence of diffuse, continuous theta activity predominantly in the 5-6 Hz range. No electrographic seizures noted. No focal or generalized epileptiform discharges noted.  EKG showed sinus rhythm.  Impression: this is an abnormal EEG with findings supportive of no specific mild global cerebral dysfunction. Please, be aware that the absence of epileptiform discharges do not exclude the possibility of epilepsy.  Clinical correlation is advised.   Dorian Pod, MD

## 2015-02-18 NOTE — ED Notes (Signed)
Attempted report 

## 2015-02-18 NOTE — Telephone Encounter (Signed)
Page received from Jerelle's wife re: new onset and worsening dysphasia, intermittent inability to recognize family members, inappropriate word responses and facial grimace. Displayed right arm drift when instructed; facial droop and drift may be his baseline with previous CVA, however new onset cognitive changes worrisome for CVA. Recently D/Cd from extended hospitalization and was changed to [place] for anticoagulation management. LVAD numbers read to me over the phone and are all baseline Flow: 4.4 Speed: 8400 PI: 6.8 Power 4.6w. When asked when last seen normal was she was very unsure but reported to me 4 pm as an estimation. Advised to report to ED for Head CT to r/o stroke.   ED Charge RN notified. D/W Rapid Response RN April; he may have missed window for intervention. Will call Code Stroke for now until we can get more details about last seen well timing. Arrangements made to take patient to CT upon arrival. Dr. Haroldine Laws updated with events. Will have ED page after neuro evaluates and CT scan read.

## 2015-02-18 NOTE — ED Notes (Signed)
Speech therapy came to pt room and passed him for swallow screen.

## 2015-02-19 ENCOUNTER — Encounter (HOSPITAL_COMMUNITY): Payer: Self-pay

## 2015-02-19 ENCOUNTER — Inpatient Hospital Stay (HOSPITAL_COMMUNITY): Payer: Medicare Other | Admitting: Certified Registered"

## 2015-02-19 ENCOUNTER — Inpatient Hospital Stay (HOSPITAL_COMMUNITY): Payer: Medicare Other

## 2015-02-19 DIAGNOSIS — I615 Nontraumatic intracerebral hemorrhage, intraventricular: Secondary | ICD-10-CM

## 2015-02-19 DIAGNOSIS — J9601 Acute respiratory failure with hypoxia: Secondary | ICD-10-CM

## 2015-02-19 DIAGNOSIS — I619 Nontraumatic intracerebral hemorrhage, unspecified: Secondary | ICD-10-CM

## 2015-02-19 DIAGNOSIS — G936 Cerebral edema: Secondary | ICD-10-CM

## 2015-02-19 DIAGNOSIS — I639 Cerebral infarction, unspecified: Principal | ICD-10-CM | POA: Insufficient documentation

## 2015-02-19 DIAGNOSIS — G9340 Encephalopathy, unspecified: Secondary | ICD-10-CM

## 2015-02-19 DIAGNOSIS — I611 Nontraumatic intracerebral hemorrhage in hemisphere, cortical: Secondary | ICD-10-CM

## 2015-02-19 LAB — CBC
HCT: 29.4 % — ABNORMAL LOW (ref 39.0–52.0)
Hemoglobin: 9.6 g/dL — ABNORMAL LOW (ref 13.0–17.0)
MCH: 28.7 pg (ref 26.0–34.0)
MCHC: 32.7 g/dL (ref 30.0–36.0)
MCV: 87.8 fL (ref 78.0–100.0)
Platelets: 469 10*3/uL — ABNORMAL HIGH (ref 150–400)
RBC: 3.35 MIL/uL — ABNORMAL LOW (ref 4.22–5.81)
RDW: 15.1 % (ref 11.5–15.5)
WBC: 20.8 10*3/uL — ABNORMAL HIGH (ref 4.0–10.5)

## 2015-02-19 LAB — PROTIME-INR
INR: 1.31 (ref 0.00–1.49)
Prothrombin Time: 16.4 seconds — ABNORMAL HIGH (ref 11.6–15.2)

## 2015-02-19 LAB — BLOOD GAS, ARTERIAL
ACID-BASE DEFICIT: 6.4 mmol/L — AB (ref 0.0–2.0)
BICARBONATE: 19.1 meq/L — AB (ref 20.0–24.0)
Drawn by: 345601
FIO2: 100
MECHVT: 530 mL
O2 Saturation: 78.3 %
PEEP: 5 cmH2O
Patient temperature: 98.6
RATE: 14 resp/min
TCO2: 20.4 mmol/L (ref 0–100)
pCO2 arterial: 41.9 mmHg (ref 35.0–45.0)
pH, Arterial: 7.282 — ABNORMAL LOW (ref 7.350–7.450)
pO2, Arterial: 53.1 mmHg — ABNORMAL LOW (ref 80.0–100.0)

## 2015-02-19 LAB — BASIC METABOLIC PANEL
Anion gap: 20 — ABNORMAL HIGH (ref 5–15)
BUN: 22 mg/dL — ABNORMAL HIGH (ref 6–20)
CALCIUM: 9.7 mg/dL (ref 8.9–10.3)
CO2: 20 mmol/L — ABNORMAL LOW (ref 22–32)
Chloride: 94 mmol/L — ABNORMAL LOW (ref 101–111)
Creatinine, Ser: 1.44 mg/dL — ABNORMAL HIGH (ref 0.61–1.24)
GFR calc Af Amer: >60 mL/min (ref >60)
GFR calc non Af Amer: 54 mL/min — ABNORMAL LOW (ref >60)
GLUCOSE: 532 mg/dL — AB (ref 65–99)
Potassium: 5.3 mmol/L — ABNORMAL HIGH (ref 3.5–5.1)
Sodium: 134 mmol/L — ABNORMAL LOW (ref 135–145)

## 2015-02-19 LAB — GLUCOSE, CAPILLARY
GLUCOSE-CAPILLARY: 450 mg/dL — AB (ref 65–99)
Glucose-Capillary: 383 mg/dL — ABNORMAL HIGH (ref 65–99)
Glucose-Capillary: 487 mg/dL — ABNORMAL HIGH (ref 65–99)
Glucose-Capillary: 361 mg/dL — ABNORMAL HIGH (ref 65–99)
Glucose-Capillary: 300 mg/dL — ABNORMAL HIGH (ref 65–99)
Glucose-Capillary: 206 mg/dL — ABNORMAL HIGH (ref 65–99)

## 2015-02-19 LAB — LACTATE DEHYDROGENASE: LDH: 299 U/L — AB (ref 98–192)

## 2015-02-19 MED ORDER — LORAZEPAM 2 MG/ML IJ SOLN: 2.0000 mg | Freq: Once | INTRAMUSCULAR | Status: DC

## 2015-02-19 MED ORDER — ALBUTEROL SULFATE (2.5 MG/3ML) 0.083% IN NEBU: 2.5000 mg | INHALATION_SOLUTION | RESPIRATORY_TRACT | Status: DC | PRN

## 2015-02-19 MED ORDER — INSULIN ASPART 100 UNIT/ML ~~LOC~~ SOLN: 2.0000 [IU] | SUBCUTANEOUS | Status: DC

## 2015-02-19 MED ORDER — PROTHROMBIN COMPLEX CONC HUMAN 500 UNITS IV KIT
3162.0000 [IU] | PACK | Status: AC
  Administered 2015-02-19: 3162 [IU] via INTRAVENOUS
  Filled 2015-02-19: qty 126

## 2015-02-19 MED ORDER — AMIODARONE HCL 200 MG PO TABS: 200.0000 mg | ORAL_TABLET | Freq: Every day | ORAL | Status: DC

## 2015-02-19 MED ORDER — INSULIN REGULAR HUMAN 100 UNIT/ML IJ SOLN
INTRAMUSCULAR | Status: DC
  Administered 2015-02-19: 3.2 [IU]/h via INTRAVENOUS
  Filled 2015-02-19: qty 2.5

## 2015-02-19 MED ORDER — LORAZEPAM 2 MG/ML IJ SOLN
2.0000 mg | Freq: Once | INTRAMUSCULAR | Status: DC
Start: 2015-02-19 — End: 2015-02-20

## 2015-02-19 MED ORDER — VITAL HIGH PROTEIN PO LIQD: 1000.0000 mL | ORAL | Status: DC

## 2015-02-19 MED ORDER — SODIUM CHLORIDE 0.9 % IV SOLN
INTRAVENOUS | Status: DC
  Administered 2015-02-19: 06:00:00 via INTRAVENOUS

## 2015-02-19 MED ORDER — FENTANYL CITRATE (PF) 100 MCG/2ML IJ SOLN: 100.0000 ug | INTRAMUSCULAR | Status: DC | PRN

## 2015-02-19 MED ORDER — MIDAZOLAM HCL 2 MG/2ML IJ SOLN: 2.0000 mg | INTRAMUSCULAR | Status: DC | PRN

## 2015-02-19 MED ORDER — CHLORHEXIDINE GLUCONATE 0.12% ORAL RINSE (MEDLINE KIT): 15.0000 mL | Freq: Two times a day (BID) | OROMUCOSAL | Status: DC

## 2015-02-19 MED ORDER — SODIUM CHLORIDE 0.9 % IV SOLN
1500.0000 mg | Freq: Once | INTRAVENOUS | Status: DC
  Filled 2015-02-19: qty 15

## 2015-02-19 MED ORDER — CHARCOAL ACTIVATED PO LIQD
50.0000 g | ORAL | Status: DC
  Filled 2015-02-19: qty 240

## 2015-02-19 MED ORDER — INSULIN ASPART 100 UNIT/ML ~~LOC~~ SOLN: 0.0000 [IU] | SUBCUTANEOUS | Status: DC

## 2015-02-19 MED ORDER — VITAL AF 1.2 CAL PO LIQD
1000.0000 mL | ORAL | Status: DC
  Filled 2015-02-19 (×2): qty 1000

## 2015-02-19 MED ORDER — DEXTROSE 10 % IV SOLN: INTRAVENOUS | Status: DC | PRN

## 2015-02-19 MED ORDER — ATORVASTATIN CALCIUM 20 MG PO TABS: 20.0000 mg | ORAL_TABLET | Freq: Every day | ORAL | Status: DC

## 2015-02-19 MED ORDER — ANTISEPTIC ORAL RINSE SOLUTION (CORINZ)
7.0000 mL | Freq: Four times a day (QID) | OROMUCOSAL | Status: DC
  Administered 2015-02-19 – 2015-02-20 (×3): 7 mL via OROMUCOSAL

## 2015-02-19 MED ORDER — SUCCINYLCHOLINE CHLORIDE 20 MG/ML IJ SOLN
INTRAMUSCULAR | Status: DC | PRN
  Administered 2015-02-19: 100 mg via INTRAVENOUS

## 2015-02-19 MED ORDER — SODIUM CHLORIDE 0.9 % IV SOLN
1.0000 mg/h | INTRAVENOUS | Status: DC
  Administered 2015-02-19: 10 mg/h via INTRAVENOUS
  Filled 2015-02-19: qty 10

## 2015-02-19 MED ORDER — ETOMIDATE 2 MG/ML IV SOLN
INTRAVENOUS | Status: DC | PRN
  Administered 2015-02-19: 14 mg via INTRAVENOUS

## 2015-02-19 MED ORDER — SODIUM CHLORIDE 0.9 % IV SOLN
1.0000 mg/h | INTRAVENOUS | Status: DC
  Administered 2015-02-19 – 2015-02-20 (×3): 10 mg/h via INTRAVENOUS
  Filled 2015-02-19 (×4): qty 10

## 2015-02-19 MED ORDER — CHLORHEXIDINE GLUCONATE 0.12% ORAL RINSE (MEDLINE KIT)
15.0000 mL | Freq: Two times a day (BID) | OROMUCOSAL | Status: DC
  Administered 2015-02-19 (×2): 15 mL via OROMUCOSAL

## 2015-02-19 MED ORDER — ADULT MULTIVITAMIN W/MINERALS CH
1.0000 | ORAL_TABLET | Freq: Every day | ORAL | Status: DC
  Filled 2015-02-19: qty 1

## 2015-02-19 MED ORDER — PREDNISONE 20 MG PO TABS
20.0000 mg | ORAL_TABLET | Freq: Every day | ORAL | Status: DC
  Administered 2015-02-19: 20 mg
  Filled 2015-02-19: qty 1

## 2015-02-19 MED ORDER — LORAZEPAM 2 MG/ML IJ SOLN
INTRAMUSCULAR | Status: AC
  Administered 2015-02-19: 03:00:00
  Filled 2015-02-19: qty 1

## 2015-02-19 MED ORDER — PRO-STAT SUGAR FREE PO LIQD: 30.0000 mL | Freq: Every day | ORAL | Status: DC

## 2015-02-19 MED ORDER — PANTOPRAZOLE SODIUM 40 MG IV SOLR: 40.0000 mg | Freq: Every day | INTRAVENOUS | Status: DC

## 2015-02-19 MED ORDER — ANTISEPTIC ORAL RINSE SOLUTION (CORINZ): 7.0000 mL | Freq: Four times a day (QID) | OROMUCOSAL | Status: DC

## 2015-02-19 NOTE — Progress Notes (Signed)
STROKE TEAM PROGRESS NOTE   HISTORY Andre Tran is a 54 y.o. male with a history of left ventricular assist device, adult still's disease, previous infarct with right hemiparesis. He presents with aphasia and right-sided weakness was relatively sudden in onset. His wife states that he was doing his insulin, but seemed confused and she tried to take over and he began speaking nonsensically repeating numbers. He states that he just felt like she was doing it too quickly. On arrival, he still had difficulty with speech and had some perseveration. Code stroke was called, but was not TPA candidate due to anticoagulation. He has since had marked improvement. After discussing with his wife, he does have transient staring spells from time to time, but has never had anything like his current episode. In May, he had a spontaneous ICH in the setting of supratherapeutic INR.  He is to be admitted for further evaluation and treatment.   SUBJECTIVE (INTERVAL HISTORY) His wife is at the bedside   Overall he feels his condition has worsened y`day with Ct head showing large right brain parenchymal hemorrhage with intraventricular extension    OBJECTIVE Temp:  [97.4 F (36.3 C)-100.9 F (38.3 C)] 98.8 F (37.1 C) (08/11 0751) Pulse Rate:  [79-133] 106 (08/11 0824) Cardiac Rhythm:  [-] Sinus tachycardia (08/11 0800) Resp:  [12-33] 24 (08/11 1100) BP: (83-136)/(63-106) 109/95 mmHg (08/11 1100) SpO2:  [96 %-100 %] 100 % (08/11 0824) FiO2 (%):  [100 %] 100 % (08/11 0824) Weight:  [130 lb 4.7 oz (59.1 kg)] 130 lb 4.7 oz (59.1 kg) (08/10 2000)   Recent Labs Lab 02/16/15 0758 02/16/15 1127 02/19/15 0607 02/19/15 0754 02/19/15 0921  GLUCAP 217* 362* 450* 487* 361*    Recent Labs Lab 02/15/15 0410 02/16/15 0305 02/19/2015 0128 02/09/2015 0130 02/17/2015 2127 02/19/15 0530  NA 130* 128* 134* 132* 133* 134*  K 5.1 4.4 4.6 4.6 4.5 5.3*  CL 94* 92* 96* 96* 96* 94*  CO2 25 27 26   --  26 20*  GLUCOSE 196*  277* 338* 346* 401* 532*  BUN 27* 22* 21* 25* 19 22*  CREATININE 1.04 1.01 1.02 0.70 1.02 1.44*  CALCIUM 9.3 9.2 9.7  --  9.6 9.7    Recent Labs Lab 02/27/2015 0128 02/17/2015 2127  AST 76* 55*  ALT 104* 90*  ALKPHOS 216* 182*  BILITOT 0.6 1.0  PROT 7.1 7.6  ALBUMIN 3.2* 3.3*    Recent Labs Lab 02/15/15 0410 02/16/15 0305 02/10/2015 0128 02/28/2015 0130 02/17/2015 2127 02/19/15 0530  WBC 8.9 9.7 15.3*  --  14.7* 20.8*  NEUTROABS  --   --  13.2*  --  12.6*  --   HGB 11.5* 9.1* 9.6* 10.9* 10.0* 9.6*  HCT 32.8* 27.2* 28.8* 32.0* 30.3* 29.4*  MCV 85.0 84.7 86.2  --  87.6 87.8  PLT 337 353 377  --  425* 469*    Recent Labs Lab 03/05/2015 2127  TROPONINI <0.03    Recent Labs  03/05/2015 0128 02/19/2015 2127 02/19/15 0530  LABPROT 19.3* 20.2* 16.4*  INR 1.62* 1.73* 1.31   No results for input(s): COLORURINE, LABSPEC, PHURINE, GLUCOSEU, HGBUR, BILIRUBINUR, KETONESUR, PROTEINUR, UROBILINOGEN, NITRITE, LEUKOCYTESUR in the last 72 hours.  Invalid input(s): APPERANCEUR     Component Value Date/Time   CHOL 156 02/16/2015 2127   TRIG 133 02/12/2015 2127   HDL 35* 02/12/2015 2127   CHOLHDL 4.5 02/19/2015 2127   VLDL 27 02/13/2015 2127   LDLCALC 94 02/17/2015 2127   Lab  Results  Component Value Date   HGBA1C 5.5 12/24/2014      Component Value Date/Time   LABOPIA NEGATIVE 04/18/2007 1930   COCAINSCRNUR NEGATIVE 04/18/2007 1930   LABBENZ NEGATIVE 04/18/2007 1930   AMPHETMU NEGATIVE 04/18/2007 1930     Recent Labs Lab 03/09/2015 0423  ETH <5     IMAGING  Ct Head Wo Contrast  02/19/2015   CLINICAL DATA:  Acute onset of altered mental status. Code stroke. Initial encounter.  EXAM: CT HEAD WITHOUT CONTRAST  TECHNIQUE: Contiguous axial images were obtained from the base of the skull through the vertex without intravenous contrast.  COMPARISON:  CT of the head performed 02/15/2015  FINDINGS: A large new intraparenchymal hemorrhage is noted at the high right posterior parietal  lobe, measuring 4.5 x 3.1 cm. Surrounding edema is noted. Underlying diffuse intraventricular hemorrhage is noted, involving both the lateral ventricles, third ventricle and fourth ventricle, with new mild hydrocephalus.  Ex vacuo dilatation of the left lateral ventricle is again noted, with associated chronic infarct and encephalomalacia. No significant midline shift is seen. No subfalcine or transtentorial herniation is seen at this time.  There is no evidence of fracture; visualized osseous structures are unremarkable in appearance. The orbits are within normal limits. The paranasal sinuses and mastoid air cells are well-aerated. No significant soft tissue abnormalities are seen.  IMPRESSION: 1. Large new intraparenchymal hemorrhage at the high right posterior parietal lobe, measuring 4.5 x 3.1 cm, with surrounding edema. 2. Underlying diffuse intraventricular hemorrhage involving both lateral ventricles, third ventricle and fourth ventricle, with new mild hydrocephalus. No evidence of significant midline shift or herniation at this time. 3. Chronic infarct and encephalomalacia again noted at the left parietal lobe, with associated ex vacuo dilatation of the left lateral ventricle.  Critical Value/emergent results were called by telephone at the time of interpretation on 02/19/2015 at 3:16 am to Dr. Leonel Ramsay, who verbally acknowledged these results.   Electronically Signed   By: Garald Balding M.D.   On: 02/19/2015 03:22   Ct Head Wo Contrast  03/03/2015   CLINICAL DATA:  Acute onset of right-sided weakness. Code stroke. Initial encounter.  EXAM: CT HEAD WITHOUT CONTRAST  TECHNIQUE: Contiguous axial images were obtained from the base of the skull through the vertex without intravenous contrast.  COMPARISON:  CT of the head performed 02/03/2015  FINDINGS: There is no evidence of acute infarction, mass lesion, or intra- or extra-axial hemorrhage on CT.  Chronic infarct is noted at the left parietal lobe and  left basal ganglia, with associated encephalomalacia. There is ex vacuo dilatation of the left lateral ventricle. A small chronic infarct is noted at the posterior aspect of the right temporal lobe. Mild to moderate cortical volume loss is noted.  The brainstem and fourth ventricle are within normal limits. No mass effect or midline shift is seen.  There is no evidence of fracture; visualized osseous structures are unremarkable in appearance. The visualized portions of the orbits are within normal limits. The paranasal sinuses and mastoid air cells are well-aerated. No significant soft tissue abnormalities are seen.  IMPRESSION: 1. No acute intracranial pathology seen on CT. 2. Chronic infarct at the left frontal lobe and left basal ganglia, with associated encephalomalacia. Ex vacuo dilatation of the left lateral ventricle. 3. Small chronic infarct at the posterior aspect of the right temporal lobe. 4. Mild to moderate cortical volume loss noted.  These results were called by telephone at the time of interpretation on 03/08/2015 at 1:22 am  to Dr. Leonel Ramsay, who verbally acknowledged these results.   Electronically Signed   By: Garald Balding M.D.   On: 02/28/2015 05:05   Dg Chest Port 1 View  02/19/2015   CLINICAL DATA:  Endotracheal tube placement.  Initial encounter.  EXAM: PORTABLE CHEST - 1 VIEW  COMPARISON:  Chest radiograph performed 01/28/2015, and CT of the chest performed 02/03/2015  FINDINGS: The patient's endotracheal tube is seen ending 2 cm above the carina. An enteric tube is noted extending below the diaphragm. A right IJ line is noted ending about the cavoatrial junction.  The lungs are well-aerated and clear. There is no evidence of focal opacification, pleural effusion or pneumothorax.  The cardiomediastinal silhouette is borderline normal in size. The patient is status post median sternotomy. A pacemaker/AICD is noted overlying the left chest wall, with leads ending overlying the right atrium  and right ventricle. A left ventricular assist device is noted. No acute osseous abnormalities are seen.  IMPRESSION: 1. Endotracheal tube seen ending 2 cm above the carina. 2. No acute cardiopulmonary process seen.   Electronically Signed   By: Garald Balding M.D.   On: 02/19/2015 05:41   Dg Abd Portable 1v  02/19/2015   CLINICAL DATA:  Assess nasogastric tube placement  EXAM: PORTABLE ABDOMEN - 1 VIEW  COMPARISON:  Chest x-ray of today's date  FINDINGS: The esophagogastric tube traverses the esophagus, enters the stomach, and recurves upon itself such that the proximal and distal port are in the gastric cardia. There is a moderate amount of dense stool within the colon. No free extraluminal gas is observed. A left ventricular assist device projects over the upper quadrant of the abdomen on the left. The cardiac silhouette is normal in size.  IMPRESSION: The esophagogastric tube has its proximal port and tip in the gastric cardia while a good portion of the tube lies coiled in the fundus.   Electronically Signed   By: David  Martinique M.D.   On: 02/19/2015 07:25   EEG this is an abnormal EEG with findings supportive of no specific mild global cerebral dysfunction. Please, be aware that the absence of epileptiform discharges do not exclude the possibility of epilepsy.     PHYSICAL EXAM Patient is intubated . Afebrile. Head is nontraumatic. Neck is supple without bruit.    Cardiac exam no murmur or gallop. Lungs are clear to auscultation. Distal pulses are well felt. Neurological Exam :   Intubated. On no sedation. Comatose and unresponsive. Eyes are closed. Pupils 3 mm irregular not reactive. Corneal reflexes  present but sluggish. Fundi could not be visualized. Dolls eye movements are sluggish. Weak cough and gag. Face is symmetric. Tongue is midline. No spontaneous extremity movements. No withdrawal to painful stimuli with minimum extensor posturing left greater than right in the upper extremities.  Plantars are both mute. Tone is increased bilaterally. ASSESSMENT/PLAN [name] is a 54 y.o. male with history of LVAD, Still's disease, previous infarct and ICH presenting with speech difficulties, confusion and worsening R hemiparesis. He did not receive IV t-PA due to being on anticoagulation.   Transient aphasia, worsening R hemiparesis, encephalopathy upon presentation but with significant neurological worsening with comatose unresponsive state due to large right brain parenchymal hemorrhage  CT head unremarkable for acute pathology  Repeat CT 8/11 to confirm stroke  No MRI  Due to LVAD  Carotid Doppler  No ordered  2D Echo  01/26/2015 LVEF severely impaired at 25-30%, AV not opening, trivial regurd. Septal  bowing. RV function Normal. No clot seen  EEG no seizure  LDL not ordered  HgbA1c not ordered  eliquis for VTE prophylaxis Diet NPO time specified  eliquis (apixaban) prior to admission, now on eliquis (apixaban)  Ongoing aggressive stroke risk factor management  Therapy recommendations:  pending   Disposition:  pending   Hypertension  Stable  Hyperlipidemia  Home meds:  lipitor 40 mg, resumed in hospital  LDL goal < 70  Continue statin at discharge  Diabetes  HgbA1c goal < 7.0  Other Stroke Risk Factors  LVAD  Hx stroke/TIA  2008 Right basal ganglia infarct, TPA and unsuccessful attempt at clot retrieval with hemorrhagic conversion, residual right-sided weakness  2016, May - spontaneous ICH in setting of supratherapeutic INR w/ resultant speech difficutlies  Other Pertinent History  Hx LV thrombus 2009  Hospital day # [place] for Pager information 02/19/2015 11:30 AM  I have personally examined this patient, reviewed notes, independently viewed imaging studies, participated in medical decision making and plan of care. I have made any additions or clarifications directly to the above note.  Agree with note above. He has presented with aphasia and right hemiparesis.due to to likely cardioembolic stroke despite anticoagulation and unfortunately developed a new large right brain parenchymal hemorrhage yesterday with intraventricular extension with now very poor neurological exam and likelihood of survival being very slim. I had a long discussion at the bedside with the patient's wife and twin daughters regarding his poor prognosis and answered questions. Family appears to be quite clear that patient did not want prolonged ventilatory support and leaning towards comfort care but wants to wait for arrival of his adopted daughter later today. This patient is critically ill and at significant risk of neurological worsening, death and care requires constant monitoring of vital signs, hemodynamics,respiratory and cardiac monitoring, extensive review of multiple databases, frequent neurological assessment, discussion with family, other specialists and medical decision making of high complexity.I have made any additions or clarifications directly to the above note.This critical care time does not reflect procedure time, or teaching time or supervisory time of PA/NP/Med Resident etc but could involve care discussion time.  I spent 30 minutes of neurocritical care time  in the care of  this patient.  Antony Contras, MD Medical Director [facility] Pager: [phone] 02/19/2015 11:30 AM    To contact Stroke Continuity provider, please refer to [email or web address] After hours, contact General Neurology

## 2015-02-19 NOTE — Progress Notes (Addendum)
Pt Hr went into the high 130's. Rn went in to check on pt and he seemed to be posturing. Pt pupils were equal and reactive. Pt was responding to voice and painful stimuli. Code stroke was called and rapid response came to bedside. Neurology came in and ordered 4 mg of ativan with no response. Took down for CT with MD.

## 2015-02-19 NOTE — Progress Notes (Addendum)
Patient had sudden onset mental status change earlier with sudden onset of extensor posturing.   On exam He has bilateral extensor posturing of bilateral arms with subtle clonic activitiy. Despite this, he does have grimace to noxious stimuli of the bilateral feet. He does not open eyes. He has normal pupils that are reactive, inact corneals, intact doll's eye.   Given the presence of clonic activity he is being treated as seizure, with ativan 2mg  x1, then repeated.   Due to the unusual feature of grimace with bilateral extension, devastating cerebral injury will need to be ruled out with CT head, if no hemorrage, then CTA to rule out basilar thrombosis.   This patient is critically ill and at significant risk of neurological worsening, death and care requires constant monitoring of vital signs, hemodynamics,respiratory and cardiac monitoring, neurological assessment, discussion with family, other specialists and medical decision making of high complexity. I spent 45 minutes of neurocritical care time  in the care of  this patient.  Roland Rack, MD [facility] [phone]  If 7pm- 7am, please page neurology on call as listed in [place]. 02/19/2015  2:51 AM   CT shows large ICH with IVH. Wandalee Ferdinand has been started. I have consulted neurosurgery for consideration of EVD, but Dr Ronnald Ramp is not clear that there would be any significant benefit from it. I had discussed this with he wife including likelihood of severe disability even in a best case scenario, but she wants continued aggressive measures including intubation and surgery if indicated.  Neurosurgery to see in consult. GCS 4, ICH score 4 which is associated with 97% mortality. I have consulted anesthesia for intubation, will need CCM assistance with ventilator management.   Roland Rack, MD [facility] [phone]  If 7pm- 7am, please page neurology on call as listed in [place].

## 2015-02-19 NOTE — Consult Note (Signed)
PULMONARY / CRITICAL CARE MEDICINE   Name: Andre Tran MRN: [id number] DOB: 1960-08-09    ADMISSION DATE:  02/12/2015 CONSULTATION DATE:  02/19/2015  REFERRING MD :  Leonel Ramsay  CHIEF COMPLAINT:  AMS  INITIAL PRESENTATION:  54 y.o. M with recent LVAD placement, brought to [facility] ED 8/10 with aphasia and right sided weakness.  Initial head CT negative.  In early AM hours 8/11, had sudden mental status change with posturing.  Repeat CT with large IPH with intraventricular extension.  He was transferred to ICU and intubated by anesthesia.  PCCM called for vent management.    STUDIES:  CT head 8/10 >>> no acute pathology.  Chronic infarct at the left frontal lobe and left basal ganglia with associated encephalomalacia.  Small chronic infarct at posterior aspect of the right temporal lobe. EEG 8/10 >>> abnormal, no specific mild global cerebral dysfunction. CT head 8/11 >>> large new IPH at the high right posterior parietal lobe with surrounding edema.  Underlying diffuse intraventricular hemorrhage involving both lateral ventricles, 3rd ventricle, and 4th ventricle, with mild hydrocephalus.  SIGNIFICANT EVENTS: 8/10 - admitted with TIA vs CVA vs seizures. 8/11 - new onset mental status change, large new IPH with IV extension.   HISTORY OF PRESENT ILLNESS:  Pt is encephalopathic; therefore, this HPI is obtained from chart review. Andre Tran is a 54 y.o. F with PMH as outlined below including LVAD placement 10/13/2014 (on eliquis).  He presented to ED 02/19/15 with aphasia and right sided weakness.  Head CT was negative (unable to obtain MRI due to LVAD) and he was admitted for CVA vs TIA vs seizure.  EEG was obtained which revealed no seizures.  During early AM hours of 8/11, he had sudden onset of mental status change with bilateral extensor / decerebrate posturing as well as subtle clonic activity.  He had mild grimacing to noxious stimuli of his bilateral feet.  He was treated for seizure and  had STAT CT of the head which revealed a large new IPH with surrounding edema and  intraventricular extension.  He was given K-centra and was intubated by anesthesia for GCS 3.  PCCM was called for vent management.  Of note, he has hx of CVA with hemorrhagic conversion in 2008 and also had ICH in May 2016.   PAST MEDICAL HISTORY :   has a past medical history of Paroxysmal ventricular tachycardia (2005, 2010); HYPERTENSION, UNSPECIFIED; HYPERLIPIDEMIA-MIXED; CVA; Nonischemic cardiomyopathy; CHF (congestive heart failure); Diabetes mellitus; Cancer (1992, 2007); Depression; LV (left ventricular) mural thrombus (2009); Small bowel obstruction (2001); Jejunal intussusception (2008); Thrombus; Splenic mass (2009); Hypotension; Noncompliance with medication regimen; AICD (automatic cardioverter/defibrillator) present (2005, 2010); Presence of permanent cardiac pacemaker; and Renal insufficiency.  has past surgical history that includes Cardiac defibrillator placement; Vasectomy; Bowel resection; Pacemaker insertion; Insert / replace / remove pacemaker; Colonoscopy (N/A, 09/18/2014); Esophagogastroduodenoscopy (N/A, 09/18/2014); left and right heart catheterization with coronary angiogram (N/A, 09/19/2014); Colonoscopy (N/A, 09/19/2014); Insertion of implantable left ventricular assist device (N/A, 10/13/2014); TEE without cardioversion (N/A, 10/13/2014); Exploration post operative open heart (N/A, 10/13/2014); and Cardioversion (N/A, 01/30/2015). Prior to Admission medications   Medication Sig Start Date End Date Taking? Authorizing Provider  acetaminophen (TYLENOL) 650 MG CR tablet Take 1 tablet (650 mg total) by mouth every 8 (eight) hours as needed for pain. 12/19/14  Yes Alexa Sherral Hammers, MD  albuterol (PROVENTIL HFA;VENTOLIN HFA) 108 (90 BASE) MCG/ACT inhaler Inhale 1-2 puffs into the lungs every 6 (six) hours as needed for wheezing  or shortness of breath.   Yes Historical Provider, MD  amiodarone (PACERONE) 200  MG tablet Take 1 tablet (200 mg total) by mouth daily. 10/24/14  Yes Amy D Clegg, NP  amLODipine (NORVASC) 2.5 MG tablet Take 1 tablet (2.5 mg total) by mouth daily. 02/16/15  Yes Shirley Friar, PA-C  apixaban (ELIQUIS) 5 MG TABS tablet Take 1 tablet (5 mg total) by mouth 2 (two) times daily. 02/16/15  Yes Shirley Friar, PA-C  atorvastatin (LIPITOR) 40 MG tablet Take 0.5 tablets (20 mg total) by mouth at bedtime. Patient taking differently: Take 20 mg by mouth daily.  09/04/14  Yes Larey Dresser, MD  bisacodyl (DULCOLAX) 5 MG EC tablet Take 1 tablet (5 mg total) by mouth daily as needed for moderate constipation. 09/04/14  Yes Larey Dresser, MD  insulin glargine (LANTUS) 100 UNIT/ML injection Inject 0.05 mLs (5 Units total) into the skin at bedtime. 10/24/14  Yes Amy D Ninfa Meeker, NP  Magnesium 400 MG TABS Take 1 tablet by mouth daily with breakfast. 10/30/14  Yes Amy D Clegg, NP  Nutritional Supplements (ENSURE NUTRITION SHAKE) LIQD Take 1 Bottle by mouth 2 (two) times daily at 10 AM and 5 PM. Patient taking differently: Take 1 Bottle by mouth 3 (three) times daily.  01/26/15  Yes Shaune Pascal Bensimhon, MD  pantoprazole (PROTONIX) 40 MG tablet Take 1 tablet (40 mg total) by mouth daily. 10/24/14  Yes Amy D Ninfa Meeker, NP  predniSONE (DELTASONE) 20 MG tablet Take 1 tablet (20 mg total) by mouth daily with breakfast. 02/16/15  Yes Shirley Friar, PA-C  traMADol (ULTRAM) 50 MG tablet Take 1 tablet (50 mg total) by mouth every 12 (twelve) hours as needed for moderate pain or severe pain. 02/16/15  Yes Shirley Friar, PA-C   Allergies  Allergen Reactions  . Hydralazine Other (See Comments)    Severe headaches     FAMILY HISTORY:  Family History  Problem Relation Age of Onset  . Other      No known family h/o heart disease  . Heart disease    . Diabetes Mother   . Other Other     complications from hip replacement    SOCIAL HISTORY:  reports that he has never smoked. He has never  used smokeless tobacco. He reports that he drinks about 1.2 oz of alcohol per week. He reports that he does not use illicit drugs.  REVIEW OF SYSTEMS:  Unable to obtain as pt is encephalopathic.  SUBJECTIVE:   VITAL SIGNS: Temp:  [97.4 F (36.3 C)-98.6 F (37 C)] 97.4 F (36.3 C) (08/10 2344) Pulse Rate:  [78-93] 93 (08/11 0000) Resp:  [15-33] 25 (08/11 0000) BP: (83-136)/(63-98) 83/63 mmHg (08/11 0000) SpO2:  [98 %-100 %] 100 % (08/11 0000) Weight:  [59.1 kg (130 lb 4.7 oz)] 59.1 kg (130 lb 4.7 oz) (08/10 2000) HEMODYNAMICS:   VENTILATOR SETTINGS:   INTAKE / OUTPUT: Intake/Output      08/10 0701 - 08/11 0700   Urine (mL/kg/hr) 175 (0.1)   Emesis/NG output 300 (0.2)   Total Output 475   Net -475       Urine Occurrence 1 x     PHYSICAL EXAMINATION: General: Thin AA male, non-responsive. Neuro: GCS 3. HEENT: [place]/AT. PERRL, sclerae anicteric. Cardiovascular: Mechanical pump heard, no M/R/G.  Lungs: Respirations even and unlabored.  CTA bilaterally, No W/R/R. Abdomen: BS x 4, soft, NT/ND.  Musculoskeletal: Decerebrate posturing of bilateral UE's.  No edema.  Skin:  Intact, warm, no rashes.  LABS:  CBC  Recent Labs Lab 02/16/15 0305 02/19/2015 0128 02/16/2015 0130 02/24/2015 2127  WBC 9.7 15.3*  --  14.7*  HGB 9.1* 9.6* 10.9* 10.0*  HCT 27.2* 28.8* 32.0* 30.3*  PLT 353 377  --  425*   Coag's  Recent Labs Lab 02/19/2015 0128 03/11/2015 2127  APTT 33  --   INR 1.62* 1.73*   BMET  Recent Labs Lab 02/16/15 0305 03/06/2015 0128 03/03/2015 0130 02/10/2015 2127  NA 128* 134* 132* 133*  K 4.4 4.6 4.6 4.5  CL 92* 96* 96* 96*  CO2 27 26  --  26  BUN 22* 21* 25* 19  CREATININE 1.01 1.02 0.70 1.02  GLUCOSE 277* 338* 346* 401*   Electrolytes  Recent Labs Lab 02/16/15 0305 03/09/2015 0128 02/10/2015 2127  CALCIUM 9.2 9.7 9.6   Sepsis Markers No results for input(s): LATICACIDVEN, PROCALCITON, O2SATVEN in the last 168 hours. ABG No results for input(s): PHART,  PCO2ART, PO2ART in the last 168 hours. Liver Enzymes  Recent Labs Lab 02/15/2015 0128 02/09/2015 2127  AST 76* 55*  ALT 104* 90*  ALKPHOS 216* 182*  BILITOT 0.6 1.0  ALBUMIN 3.2* 3.3*   Cardiac Enzymes  Recent Labs Lab 03/02/2015 2127  TROPONINI <0.03   Glucose  Recent Labs Lab 02/15/15 0740 02/15/15 1147 02/15/15 1636 02/15/15 2133 02/16/15 0758 02/16/15 1127  GLUCAP 187* 220* 256* 350* 217* 362*    Imaging Ct Head Wo Contrast  02/19/2015   CLINICAL DATA:  Acute onset of altered mental status. Code stroke. Initial encounter.  EXAM: CT HEAD WITHOUT CONTRAST  TECHNIQUE: Contiguous axial images were obtained from the base of the skull through the vertex without intravenous contrast.  COMPARISON:  CT of the head performed 03/11/2015  FINDINGS: A large new intraparenchymal hemorrhage is noted at the high right posterior parietal lobe, measuring 4.5 x 3.1 cm. Surrounding edema is noted. Underlying diffuse intraventricular hemorrhage is noted, involving both the lateral ventricles, third ventricle and fourth ventricle, with new mild hydrocephalus.  Ex vacuo dilatation of the left lateral ventricle is again noted, with associated chronic infarct and encephalomalacia. No significant midline shift is seen. No subfalcine or transtentorial herniation is seen at this time.  There is no evidence of fracture; visualized osseous structures are unremarkable in appearance. The orbits are within normal limits. The paranasal sinuses and mastoid air cells are well-aerated. No significant soft tissue abnormalities are seen.  IMPRESSION: 1. Large new intraparenchymal hemorrhage at the high right posterior parietal lobe, measuring 4.5 x 3.1 cm, with surrounding edema. 2. Underlying diffuse intraventricular hemorrhage involving both lateral ventricles, third ventricle and fourth ventricle, with new mild hydrocephalus. No evidence of significant midline shift or herniation at this time. 3. Chronic infarct and  encephalomalacia again noted at the left parietal lobe, with associated ex vacuo dilatation of the left lateral ventricle.  Critical Value/emergent results were called by telephone at the time of interpretation on 02/19/2015 at 3:16 am to Dr. Leonel Ramsay, who verbally acknowledged these results.   Electronically Signed   By: Garald Balding M.D.   On: 02/19/2015 03:22     ASSESSMENT / PLAN:  NEUROLOGIC A:   Acute metabolic encephalopathy. Large IPH with surrounding edema and IV extension. Hx prior CVA, depression. P:   Sedation:  Fentanyl PRN, Midazolam PRN, currently requiring none. RASS goal: 0 to -1. Daily WUA. Neurology and neurosurgery managing. Monitor for signs of progression to brain death.  CARDIOVASCULAR CVL R IJ 8/11 >>>  A:  Chronic systolic HF s/p LVAD. Hx HTN, HLD. P:  Cards following, will defer to them on LVAD management. Hold ASA, eliquis. Continue amio, atorvastatin (change to per tube).  PULMONARY OETT 8/11 >>> A: VDRF due to large new ICH 8/11. P:   Full mechanical support, no weaning to given mental status. Will need discussion of trach/peg which would be highly not recommended in this case. VAP bundle. Albuterol PRN. CXR in AM. Adjust vent for ABG.  RENAL A:   Mild hyponatremia. P:   NS @ 75. BMP in AM. Replace electrolytes as indicated.  GASTROINTESTINAL A:   Mild transaminitis. GI prophylaxis. Nutrition. P:   LFT's in AM. SUP: Pantoprazole. Consult nutrition for TF. D/c feeding supplement.  HEMATOLOGIC A:   Chronic anticoagulation on Eliquis - now with large IPH. VTE Prophylaxis. Hx lymphoma s/p treatment. P:  Activated charcoal and K-centra. SCD's. CBC in AM.  INFECTIOUS A:   No indication of infection. P:   Monitor clinically.  ENDOCRINE A:   DM.   P:   ICU ISS.  RHEUMATOLOGIC A: Still's Disease. P: Continue daily prednisone.  Family updated: Wife by Dr. Leonel Ramsay.  Interdisciplinary Family Meeting v  Palliative Care Meeting:  Due by: 02/25/15.  Montey Hora, [place] [facility] Pager: [phone]  or [phone] 02/19/2015, 4:11 AM  Attending Note:  54 year old with extensive cardiac history with an LVAD in place presenting to the ICU after suffering a massive and devastating intracranial hemorrhage.  Due to altered mental status (GCS of 3) patient was intubated for airway protection and PCCM was consulted to assist with medical management.  On exam patient is posturing and completely unresponsive.  Neurosurgery was called and no interventions were recommended.  Family wishes for full support for now except no CPR given LVAD.  Will adjust vent and continue to follow.  The patient is critically ill with multiple organ systems failure and requires high complexity decision making for assessment and support, frequent evaluation and titration of therapies, application of advanced monitoring technologies and extensive interpretation of multiple databases.   Critical Care Time devoted to patient care services described in this note is  35  Minutes. This time reflects time of care of this signee Dr Jennet Maduro. This critical care time does not reflect procedure time, or teaching time or supervisory time of PA/NP/Med student/Med Resident etc but could involve care discussion time.  Rush Farmer, M.D. [facility] Pulmonary/Critical Care Medicine. Pager: [phone]. After hours pager: [phone].

## 2015-02-19 NOTE — H&P (Signed)
VAD TEAM History & Physical Note   Reason for Admission: Aphasia   HPI:    [name] is a 54 y.o. 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 Underwent HMII LVAD placement 10/13/2014   Developed large intracranial bleed last night. Now intubated. Non-responsive. Decerebrate posturing,    LVAD INTERROGATION:  HeartMate II LVAD:  Flow speed 3.6  8400 liters/min,, power 4.0, PI 5.0  Occasional PI event    Objective:    Vital Signs:   Temp:  [97.4 F (36.3 C)-100.9 F (38.3 C)] 98.8 F (37.1 C) (08/11 0751) Pulse Rate:  [79-133] 106 (08/11 0824) Resp:  [14-33] 18 (08/11 0824) BP: (83-136)/(63-106) 132/99 mmHg (08/11 0824) SpO2:  [96 %-100 %] 100 % (08/11 0824) FiO2 (%):  [100 %] 100 % (08/11 0824) Weight:  [59.1 kg (130 lb 4.7 oz)] 59.1 kg (130 lb 4.7 oz) (08/10 2000) Last BM Date: 02/16/15 Mean arterial Pressure 80s  Physical Exam: General: Decerebrate posturing on vent. Non-responsive HEENT:ETT Neck: supple.  Cor: Mechanical heart sounds with LVAD hum present. Lungs: clear Abdomen: soft, nontender, nondistended. No hepatosplenomegaly. No bruits or masses. Good bowel sounds. Driveline: C/D/I; securement device intact and driveline incorporated Extremities: no cyanosis, clubbing, rash, edema Neuro: Decerebrate posturing  Telemetry: NSRGeneral: Sitting in chair Well appearing. No resp difficulty     Labs: Basic Metabolic Panel:  Recent Labs Lab 02/15/15 0410 02/16/15 0305 03/04/2015 0128 02/10/2015 0130 03/02/2015 2127 02/19/15 0530  NA 130* 128* 134* 132* 133* 134*  K 5.1 4.4 4.6 4.6 4.5 5.3*  CL 94* 92* 96* 96* 96* 94*  CO2 25 27 26   --  26 20*  GLUCOSE  196* 277* 338* 346* 401* 532*  BUN 27* 22* 21* 25* 19 22*  CREATININE 1.04 1.01 1.02 0.70 1.02 1.44*  CALCIUM 9.3 9.2 9.7  --  9.6 9.7    Liver Function Tests:  Recent Labs Lab 03/02/2015 0128 02/15/2015 2127  AST 76* 55*  ALT 104* 90*  ALKPHOS 216* 182*  BILITOT 0.6 1.0  PROT 7.1 7.6  ALBUMIN 3.2* 3.3*   No results for input(s): LIPASE, AMYLASE in the last 168 hours. No results for input(s): AMMONIA in the last 168 hours.  CBC:  Recent Labs Lab 02/15/15 0410 02/16/15 0305 02/16/2015 0128 02/28/2015 0130 02/11/2015 2127 02/19/15 0530  WBC 8.9 9.7 15.3*  --  14.7* 20.8*  NEUTROABS  --   --  13.2*  --  12.6*  --   HGB 11.5* 9.1* 9.6* 10.9* 10.0* 9.6*  HCT 32.8* 27.2* 28.8* 32.0* 30.3* 29.4*  MCV 85.0 84.7 86.2  --  87.6 87.8  PLT 337 353 377  --  425* 469*    Cardiac Enzymes:  Recent Labs Lab 02/17/2015 2127  TROPONINI <0.03    BNP: BNP (last 3 results)  Recent Labs  11/05/14 1030 12/19/14 1115 01/26/15 1045  BNP 118.8* 82.3 63.0    ProBNP (last 3 results)  Recent Labs  06/12/14 1415 06/18/14 1200  PROBNP 2079.0* 2144.0*     CBG:  Recent Labs Lab 02/15/15 1636 02/15/15 2133 02/16/15 0758 02/16/15 1127 02/19/15 0607  GLUCAP 256* 350* 217* 362* 450*    Coagulation Studies:  Recent Labs  03/06/2015 0128 03/06/2015 2127 02/19/15 0530  LABPROT 19.3* 20.2* 16.4*  INR 1.62* 1.73* 1.31    Other results:   Imaging: Ct Head Wo Contrast  02/19/2015   CLINICAL DATA:  Acute onset of altered mental status. Code stroke. Initial encounter.  EXAM: CT HEAD WITHOUT CONTRAST  TECHNIQUE: Contiguous axial images were obtained from the base of the skull through the vertex without intravenous contrast.  COMPARISON:  CT of the head performed 02/13/2015  FINDINGS: A large new intraparenchymal hemorrhage is noted at the high right posterior parietal lobe, measuring 4.5 x 3.1 cm. Surrounding edema is noted. Underlying diffuse intraventricular hemorrhage is noted,  involving both the lateral ventricles, third ventricle and fourth ventricle, with new mild hydrocephalus.  Ex vacuo dilatation of the left lateral ventricle is again noted, with associated chronic infarct and encephalomalacia. No significant midline shift is seen. No subfalcine or transtentorial herniation is seen at this time.  There is no evidence of fracture; visualized osseous structures are unremarkable in appearance. The orbits are within normal limits. The paranasal sinuses and mastoid air cells are well-aerated. No significant soft tissue abnormalities are seen.  IMPRESSION: 1. Large new intraparenchymal hemorrhage at the high right posterior parietal lobe, measuring 4.5 x 3.1 cm, with surrounding edema. 2. Underlying diffuse intraventricular hemorrhage involving both lateral ventricles, third ventricle and fourth ventricle, with new mild hydrocephalus. No evidence of significant midline shift or herniation at this time. 3. Chronic infarct and encephalomalacia again noted at the left parietal lobe, with associated ex vacuo dilatation of the left lateral ventricle.  Critical Value/emergent results were called by telephone at the time of interpretation on 02/19/2015 at 3:16 am to Dr. Leonel Ramsay, who verbally acknowledged these results.   Electronically Signed   By: Garald Balding M.D.   On: 02/19/2015 03:22   Ct Head Wo Contrast  02/17/2015   CLINICAL DATA:  Acute onset of right-sided weakness. Code stroke. Initial encounter.  EXAM: CT HEAD WITHOUT CONTRAST  TECHNIQUE: Contiguous axial images were obtained from the base of the skull through the vertex without intravenous contrast.  COMPARISON:  CT of the head performed 02/03/2015  FINDINGS: There is no evidence of acute infarction, mass lesion, or intra- or extra-axial hemorrhage on CT.  Chronic infarct is noted at the left parietal lobe and left basal ganglia, with associated encephalomalacia. There is ex vacuo dilatation of the left lateral ventricle. A  small chronic infarct is noted at the posterior aspect of the right temporal lobe. Mild to moderate cortical volume loss is noted.  The brainstem and fourth ventricle are within normal limits. No mass effect or midline shift is seen.  There is no evidence of fracture; visualized osseous structures are unremarkable in appearance. The visualized portions of the orbits are within normal limits. The paranasal sinuses and mastoid air cells are well-aerated. No significant soft tissue abnormalities are seen.  IMPRESSION: 1. No acute intracranial pathology seen on CT. 2. Chronic infarct at the left frontal lobe and left basal ganglia, with associated encephalomalacia. Ex vacuo dilatation of the left lateral ventricle. 3. Small chronic infarct at the posterior aspect of the right temporal lobe. 4. Mild to moderate cortical volume loss noted.  These results were called by telephone at the time of interpretation on 02/22/2015 at 1:22 am to Dr. Leonel Ramsay, who verbally acknowledged these results.   Electronically Signed   By: Garald Balding M.D.   On: 02/13/2015 05:05   Dg Chest Port 1 View  02/19/2015  CLINICAL DATA:  Endotracheal tube placement.  Initial encounter.  EXAM: PORTABLE CHEST - 1 VIEW  COMPARISON:  Chest radiograph performed 01/28/2015, and CT of the chest performed 02/03/2015  FINDINGS: The patient's endotracheal tube is seen ending 2 cm above the carina. An enteric tube is noted extending below the diaphragm. A right IJ line is noted ending about the cavoatrial junction.  The lungs are well-aerated and clear. There is no evidence of focal opacification, pleural effusion or pneumothorax.  The cardiomediastinal silhouette is borderline normal in size. The patient is status post median sternotomy. A pacemaker/AICD is noted overlying the left chest wall, with leads ending overlying the right atrium and right ventricle. A left ventricular assist device is noted. No acute osseous abnormalities are seen.   IMPRESSION: 1. Endotracheal tube seen ending 2 cm above the carina. 2. No acute cardiopulmonary process seen.   Electronically Signed   By: Garald Balding M.D.   On: 02/19/2015 05:41   Dg Abd Portable 1v  02/19/2015   CLINICAL DATA:  Assess nasogastric tube placement  EXAM: PORTABLE ABDOMEN - 1 VIEW  COMPARISON:  Chest x-ray of today's date  FINDINGS: The esophagogastric tube traverses the esophagus, enters the stomach, and recurves upon itself such that the proximal and distal port are in the gastric cardia. There is a moderate amount of dense stool within the colon. No free extraluminal gas is observed. A left ventricular assist device projects over the upper quadrant of the abdomen on the left. The cardiac silhouette is normal in size.  IMPRESSION: The esophagogastric tube has its proximal port and tip in the gastric cardia while a good portion of the tube lies coiled in the fundus.   Electronically Signed   By: David  Martinique M.D.   On: 02/19/2015 07:25     Assessment:   1. Intracranial hemorrhage 2. Still's disease 3. Chronic systolic HF s/p HM II VAD 4. Acute respiratory failure due to #1 5. H/o previous CVA 6. H/o lymphoma s/p treatment 7. Recent renal infarct on warfarin 8. DM2  Plan/Discussion:    He has had a massive intracranial hemorrhage. He cannot recover from this. Will need terminal extubation, deactivation of his VAD and comfort care. I will discuss with his wife.  The patient is critically ill with multiple organ systems failure and requires high complexity decision making for assessment and support, frequent evaluation and titration of therapies, application of advanced monitoring technologies and extensive interpretation of multiple databases.   Critical Care Time devoted to patient care services described in this note is 45 Minutes.   Length of Stay: 1  Glori Bickers MD 02/19/2015, 8:33 AM VAD Team Pager [phone] (7am - 7am) +++VAD ISSUES ONLY+++ Advanced Heart  Failure Team Pager [phone] (M-F; [place])  Please contact [facility] for night-coverage after hours (4p -7a ) and weekends on [email or web address] for all non- LVAD Issues

## 2015-02-19 NOTE — Progress Notes (Signed)
Code Stroke called on LVAD Pt recently admitted with TIAs. Per ICU RN LSN [id number], Pt found unresponsive with extensor posturing and mild tremor assessed in bilateral arms. Withdraws and grimaces to pain. Pupils reactive.  Initially Pt symptoms thought to be a seizure. 4 mg total of Ativan given per Dr. Leonel Ramsay order at bedside. CT scan delayed by several failed attempts at new PIV for possible CTA. Pt taken to CT. NIHSS difficult to complete as Pt is comatose, NIHSS 35.  Per Dr. Leonel Ramsay Pt positive for large ICH. Dr. Haroldine Laws updated on Pt status per ICU RN. PCCM and Neurosurgery consulted per Dr. Leonel Ramsay. Pt prepared for intubation vent to be managed per PCCM . Family en route to hospital after discussion with Dr. Leonel Ramsay regarding Pt status.

## 2015-02-19 NOTE — Progress Notes (Signed)
Family at bedside. We discussed situation and extremely poor prognosis. We will proceed with extubation and LVAD deactivation.  He is on morphine and versed. We will titrate as needed. They understand he will pass today.  Izik Bingman,MD 1:15 PM

## 2015-02-19 NOTE — Progress Notes (Signed)
eLink Physician-Brief Progress Note Patient Name: [name] DOB: 01-18-1961 MRN: [id number]   Date of Service  02/19/2015  HPI/Events of Note  AcAll from [place] - Massive ICH. Coma. Wants CCM intervention  On camera - comatose  APP eval at bedside - likely tough airway. Might need sedation. Likely cannot wait 30 min for CCM MD to arrive   eICU Interventions  Neuro to call anesthesia  CCM to do vent mgmt consult     Intervention Category Major Interventions: Respiratory failure - evaluation and management  Niesha Bame 02/19/2015, 3:58 AM

## 2015-02-19 NOTE — Progress Notes (Signed)
Utilization review completed. Chella Chapdelaine, RN, BSN. 

## 2015-02-19 NOTE — Progress Notes (Signed)
MD Leonel Ramsay verbally ordered 1mg  ativan at pt's bedside. RN Mel Almond pulled a 2mg  vial from pyxis, but only requested 1mg  from pyxis, requiring a waste of 1mg . When RN returned to pt's bedside, MD verbally ordered 2mg  to be given. This created a discrepancy between the pyxis and the MAR. Pt received 2mg  ativan at 0245. RN will waste 1mg  in pyxis with RN Martinique Perkins.

## 2015-02-19 NOTE — Consult Note (Signed)
Reason for Consult:ICH/IVH Referring Physician: neurology  Andre Tran is an 54 y.o. male.   HPI:  54 year old male who was admitted a couple of days ago with acute onset aphasia and right hemiparesis. This appeared to be improving somewhat. He had recent LVAD. He was on an inquiry on this and developed acute mental status changes with very quick progression to disturb or posturing. CT scan showed a right parietal hemorrhage with intraventricular extension and early hydrocephalus. I was called to discuss the risks and benefits of EVD placement. The patient is comatose and intubated and unable to cooperate with history and physical.  Past Medical History  Diagnosis Date  . Paroxysmal ventricular tachycardia 2005, 2010    s/p AICD '05, replaced w/ St. Jude's in 2010 (PVT/V.Fib arrest requiring ICD implant in 2005)  . HYPERTENSION, UNSPECIFIED   . HYPERLIPIDEMIA-MIXED   . 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 Nonischemic cardiomyopathy     2/2 Adriamycin administration for lymphoma  . CHF (congestive heart failure)     EF 15% by echo 2009; s/p St. Jude ICD  . Diabetes mellitus     Type 2  . Cancer 1992, 2007     non hodkins lymphoma 1992, Hodgkins 2007  . Depression   . LV (left ventricular) mural thrombus 2009    2009, on coumadin  . Small bowel obstruction 2001    s/p Small bowel resection 2001  . Jejunal intussusception 2008    2008  . 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 Splenic mass 2009    Noted on Abd [place] 2009 w/ recs for f/u CT - not done  . Hypotension   . Noncompliance with medication regimen   . AICD (automatic cardioverter/defibrillator) present 2005, 2010  . Presence of permanent cardiac pacemaker   . Renal insufficiency     Past Surgical History  Procedure Laterality Date  . Cardiac defibrillator placement       2005, replaced w/ St. Jude's 2010  . Vasectomy    . Bowel resection      small bowell 2/2 obstruction 2001  . Pacemaker insertion    . Insert / replace / remove pacemaker    . Colonoscopy N/A 09/18/2014    Procedure: COLONOSCOPY;  Surgeon: [name], MD;  Location: [facility] END[facility]COPY;  Service: Endoscopy;  Laterality: N/A;  . Esophagogastroduodenoscopy N/A 09/18/2014    Procedure: ESOPHAGOGASTRODUODENOSCOPY (EGD);  Surgeon: [name], MD;  Location: [facility] ENDOSCOPY;  Service: Endoscopy;  Laterality: N/A;  . Left and right heart catheterization with coronary angiogram N/A 09/19/2014    Procedure: LEFT AND RIGHT HEART CATHETERIZATION WITH CORONARY ANGIOGRAM;  Surgeon: Jolaine Artist, MD;  Location: [facility] CATH LAB;  Service: Cardiovascular;  Laterality: N/A;  . Colonoscopy N/A 09/19/2014    Procedure: COLONOSCOPY;  Surgeon: [name], MD;  Location: [place];  Service: Gastroenterology;  Laterality: N/A;  . Insertion of implantable left ventricular assist device N/A 10/13/2014    Procedure: INSERTION OF IMPLANTABLE LEFT VENTRICULAR ASSIST DEVICE;  Surgeon: [name], MD;  Location: [place];  Service: Open Heart Surgery;  Laterality: N/A;  CIRC ARREST  NITRIC OXIDE  . Tee without cardioversion N/A 10/13/2014    Procedure: TRANSESOPHAGEAL ECHOCARDIOGRAM (TEE);  Surgeon: [name], MD;  Location: [place];  Service: Open Heart Surgery;  Laterality:  N/A;  . Exploration post operative open heart N/A 10/13/2014    Procedure: EXPLORATION POST OPERATIVE OPEN HEART;  Surgeon: [name], MD;  Location: [place] OR;  Service: Open Heart Surgery;  Laterality: N/A;  . Cardioversion N/A 01/30/2015    Procedure: CARDIOVERSION;  Surgeon: Jolaine Artist, MD;  Location: [facility] OR;  Service: Cardiovascular;  Laterality: N/A;    Allergies  Allergen Reactions  . Hydralazine Other (See Comments)    Severe headaches     Social History  Substance Use Topics  . Smoking status: Never Smoker   . Smokeless tobacco:  Never Used  . Alcohol Use: 1.2 oz/week    1 Cans of beer, 1 Shots of liquor per week     Comment: ON SPECIAL OCCASIONS    Family History  Problem Relation Age of Onset  . Other      No known family h/o heart disease  . Heart disease    . Diabetes Mother   . Other Other     complications from hip replacement     Review of Systems  Positive ROS: Unable to obtain  All other systems have been reviewed and were otherwise negative with the exception of those mentioned in the HPI and as above.  Objective: Vital signs in last 24 hours: Temp:  [97.4 F (36.3 C)-100.9 F (38.3 C)] 100.9 F (38.3 C) (08/11 0600) Pulse Rate:  [78-133] 105 (08/11 0426) Resp:  [14-33] 14 (08/11 0600) BP: (83-136)/(63-106) 104/83 mmHg (08/11 0600) SpO2:  [96 %-100 %] 100 % (08/11 0600) FiO2 (%):  [100 %] 100 % (08/11 0600) Weight:  [130 lb 4.7 oz (59.1 kg)] 130 lb 4.7 oz (59.1 kg) (08/10 2000)  General Appearance:Thin comatose patient intubated  Head: Normocephalic, without obvious abnormality, atraumatic Eyes: PERRL with midsize sluggish pupils, conjunctiva/corneas clear, EOM's unable to test       NEUROLOGIC:   Mental status: GCS 4 (E1V1M2T) Motor Exam -Extensor posturing sensory Examunable to test reflexes: symmetric Coordinatiounable to test  Cranial Nerves: I: smell Not tested  Not tested    pupils   sluggish     III,VII: ptosis   III,IV,VI: extraocular muscles    V: mastication   V: facial light touch sensation    V,VII: corneal reflex   present  VII: facial muscle function - upper    VII: facial muscle function - lower   VIII: hearing   IX: soft palate elevation    IX,X: gag reflex Present  XI: trapezius strength     XI: sternocleidomastoid strength   XI: neck flexion strength    XII: tongue strength      Data Review Lab Results  Component Value Date   WBC 20.8* 02/19/2015   HGB 9.6* 02/19/2015   HCT 29.4* 02/19/2015   MCV 87.8 02/19/2015   PLT 469* 02/19/2015    Lab Results  Component Value Date   NA 134* 02/19/2015   K 5.3* 02/19/2015   CL 94* 02/19/2015   CO2 20* 02/19/2015   BUN 22* 02/19/2015   CREATININE 1.44* 02/19/2015   GLUCOSE 532* 02/19/2015   Lab Results  Component Value Date   INR 1.31 02/19/2015    Radiology: Ct Head Wo Contrast  02/19/2015   CLINICAL DATA:  Acute onset of altered mental status. Code stroke. Initial encounter.  EXAM: CT HEAD WITHOUT CONTRAST  TECHNIQUE: Contiguous axial images were obtained from the base of the skull through the vertex without intravenous contrast.  COMPARISON:  CT of the  head performed 02/12/2015  FINDINGS: A large new intraparenchymal hemorrhage is noted at the high right posterior parietal lobe, measuring 4.5 x 3.1 cm. Surrounding edema is noted. Underlying diffuse intraventricular hemorrhage is noted, involving both the lateral ventricles, third ventricle and fourth ventricle, with new mild hydrocephalus.  Ex vacuo dilatation of the left lateral ventricle is again noted, with associated chronic infarct and encephalomalacia. No significant midline shift is seen. No subfalcine or transtentorial herniation is seen at this time.  There is no evidence of fracture; visualized osseous structures are unremarkable in appearance. The orbits are within normal limits. The paranasal sinuses and mastoid air cells are well-aerated. No significant soft tissue abnormalities are seen.  IMPRESSION: 1. Large new intraparenchymal hemorrhage at the high right posterior parietal lobe, measuring 4.5 x 3.1 cm, with surrounding edema. 2. Underlying diffuse intraventricular hemorrhage involving both lateral ventricles, third ventricle and fourth ventricle, with new mild hydrocephalus. No evidence of significant midline shift or herniation at this time. 3. Chronic infarct and encephalomalacia again noted at the left parietal lobe, with associated ex vacuo dilatation of the left lateral ventricle.  Critical Value/emergent results  were called by telephone at the time of interpretation on 02/19/2015 at 3:16 am to Dr. Leonel Ramsay, who verbally acknowledged these results.   Electronically Signed   By: Garald Balding M.D.   On: 02/19/2015 03:22   Ct Head Wo Contrast  02/19/2015   CLINICAL DATA:  Acute onset of right-sided weakness. Code stroke. Initial encounter.  EXAM: CT HEAD WITHOUT CONTRAST  TECHNIQUE: Contiguous axial images were obtained from the base of the skull through the vertex without intravenous contrast.  COMPARISON:  CT of the head performed 02/03/2015  FINDINGS: There is no evidence of acute infarction, mass lesion, or intra- or extra-axial hemorrhage on CT.  Chronic infarct is noted at the left parietal lobe and left basal ganglia, with associated encephalomalacia. There is ex vacuo dilatation of the left lateral ventricle. A small chronic infarct is noted at the posterior aspect of the right temporal lobe. Mild to moderate cortical volume loss is noted.  The brainstem and fourth ventricle are within normal limits. No mass effect or midline shift is seen.  There is no evidence of fracture; visualized osseous structures are unremarkable in appearance. The visualized portions of the orbits are within normal limits. The paranasal sinuses and mastoid air cells are well-aerated. No significant soft tissue abnormalities are seen.  IMPRESSION: 1. No acute intracranial pathology seen on CT. 2. Chronic infarct at the left frontal lobe and left basal ganglia, with associated encephalomalacia. Ex vacuo dilatation of the left lateral ventricle. 3. Small chronic infarct at the posterior aspect of the right temporal lobe. 4. Mild to moderate cortical volume loss noted.  These results were called by telephone at the time of interpretation on 03/01/2015 at 1:22 am to Dr. Leonel Ramsay, who verbally acknowledged these results.   Electronically Signed   By: Garald Balding M.D.   On: 02/10/2015 05:05   Dg Chest Port 1 View  02/19/2015   CLINICAL  DATA:  Endotracheal tube placement.  Initial encounter.  EXAM: PORTABLE CHEST - 1 VIEW  COMPARISON:  Chest radiograph performed 01/28/2015, and CT of the chest performed 02/03/2015  FINDINGS: The patient's endotracheal tube is seen ending 2 cm above the carina. An enteric tube is noted extending below the diaphragm. A right IJ line is noted ending about the cavoatrial junction.  The lungs are well-aerated and clear. There is no evidence of focal  opacification, pleural effusion or pneumothorax.  The cardiomediastinal silhouette is borderline normal in size. The patient is status post median sternotomy. A pacemaker/AICD is noted overlying the left chest wall, with leads ending overlying the right atrium and right ventricle. A left ventricular assist device is noted. No acute osseous abnormalities are seen.  IMPRESSION: 1. Endotracheal tube seen ending 2 cm above the carina. 2. No acute cardiopulmonary process seen.   Electronically Signed   By: Garald Balding M.D.   On: 02/19/2015 05:41     Assessment/Plan: Very difficult situation for a 54 year old male who was admitted with left brain issues and quickly developed a right brain ICH with intraventricular extension casting all 4 ventricles . There is early hydrocephalus involving the left lateral ventricle . Unfortunately the morbidity and mortality from this hemorrhage is extremely high and his prognosis is grim. He is on anticoagulation and that theoretically has been reversed but there is no way to test this and therefore there is increased risk with the EVD insertion. However if an EVD is not inserted he will likely not survive. If an EVD is inserted it will likely clot off and start working quite quickly. He is also a significant risk of development of ventriculitis and shunt dependence . The possibility of a functional survival is extremely low.    I have discussed all this with the family at length. They would not want any of this done. They have a very  strong faith and they feel the patient would not want aggressive management at this point. They have decided to make him DO NOT RESUSCITATE and comfort care and extubate him. I think this is quite reasonable.   JONES,DAVID S 02/19/2015 7:26 AM

## 2015-02-19 NOTE — Procedures (Signed)
Central Venous Catheter Insertion Procedure Note Andre Tran [id number] 1960-11-15  Procedure: Insertion of Central Venous Catheter Indications: Assessment of intravascular volume, Drug and/or fluid administration and Frequent blood sampling  Procedure Details Consent: Unable to obtain consent because of altered level of consciousness. Time Out: Verified patient identification, verified procedure, site/side was marked, verified correct patient position, special equipment/implants available, medications/allergies/relevent history reviewed, required imaging and test results available.  Performed  Maximum sterile technique was used including antiseptics, cap, gloves, gown, hand hygiene, mask and sheet. Skin prep: Chlorhexidine; local anesthetic administered A antimicrobial bonded/coated triple lumen catheter was placed in the right internal jugular vein using the Seldinger technique.  Evaluation Blood flow good Complications: No apparent complications Patient did tolerate procedure well. Chest X-ray ordered to verify placement.  CXR: pending.  Procedure performed under direct ultrasound guidance for real time vessel cannulation.      Montey Hora, [facility] Pager: [phone]  or [phone] 02/19/2015, 5:17 AM

## 2015-02-19 NOTE — Procedures (Signed)
Extubation Procedure Note  Patient Details:   Name: Andre Tran DOB: 1961-05-29 MRN: [id number]   Airway Documentation:  Airway 7.5 mm (Active)  Secured at (cm) 22 cm 02/19/2015  8:24 AM  Measured From Lips 02/19/2015  8:24 AM  Secured Location Left 02/19/2015  8:24 AM  Secured By [employer] 02/19/2015  8:24 AM  Tube Holder Repositioned Yes 02/19/2015  8:24 AM  Site Condition Dry 02/19/2015  8:24 AM    Evaluation  O2 sats: stable throughout Complications: No apparent complications Patient did tolerate procedure well. Bilateral Breath Sounds: Clear, Diminished   No   Pt. Was terminally extubated to RA without any complications with family, MD, RN & LVAD team at the bedside.   Fraser Busche, [place] 02/19/2015, 1:18, PM

## 2015-02-19 NOTE — Progress Notes (Signed)
Initial Nutrition Assessment  DOCUMENTATION CODES:   Severe malnutrition in context of chronic illness  INTERVENTION:   Initiate Vital AF 1.2 @ 20 ml/hr via OG tube and increase by 10 ml every 4 hours to goal rate of 60 ml/hr.   30 ml Prostat daily  MVI daily  Tube feeding regimen provides 1828 kcal, 123 grams of protein, and 1167 ml of H2O.   Monitor phosphorus, MD to replete as needed, as pt is at risk for refeeding syndrome given severe malnutrition.   NUTRITION DIAGNOSIS:   Malnutrition related to chronic illness as evidenced by severe depletion of body fat, severe depletion of muscle mass.   GOAL:   Patient will meet greater than or equal to 90% of their needs   MONITOR:   TF tolerance, I & O's, Labs, Vent status  REASON FOR ASSESSMENT:   Consult Enteral/tube feeding initiation and management  ASSESSMENT:   Pt admitted with large ICH. 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 Underwent HMII LVAD placement 10/13/2014   Patient is currently intubated on ventilator support MV: 9.3 L/min Temp (24hrs), Avg:98.7 F (37.1 C), Min:97.4 F (36.3 C), Max:100.9 F (38.3 C)  Labs reviewed: potassium, BUN/Cr elevated, sodium low CBG's: 361-487 Has OG tube with tip in fundus.  Intubated 8/11  Diet Order:  Diet NPO time specified  Skin:  Reviewed, no issues  Last BM:  8/8  Height:   Ht Readings from Last 1 Encounters:  03/08/2015 5\' 7"  (1.702 m)    Weight:   Wt Readings from Last 1 Encounters:  02/28/2015 130 lb 4.7 oz (59.1 kg)    Ideal Body Weight:  67.2 kg  BMI:  Body mass index is 20.4 kg/(m^2).  Estimated Nutritional Needs:   Kcal:  1819  Protein:  100-120  grams  Fluid:  > 1.8 L/day  EDUCATION NEEDS:   No education needs identified at this time  Belle[place]aine Neighbors, [place], [place] Pager [phone] After Hours Pager

## 2015-02-19 NOTE — Progress Notes (Signed)
LVAD Coordinator Note:   Called to update Andre Tran about the events over the last hour regarding his sudden neurologic changes, positive ICH noted on CT scan and need to move patient to ICU for further monitoring. I advised her that it would be a good time for her to come to the hospital since there have been such significant changes with her husband's health status and that the heart failure, neurology and neurosurgery teams are working on a plan for treatment which could include possible surgery.   She understands and is on her way to the hospital.   Informed Andre Tran's nurse I called to update and she is en route.   Andre Madeira, RN VAD Coordinator   Office: [phone] 24/7 VAD Pager: [phone]

## 2015-02-19 NOTE — Progress Notes (Signed)
Patient intubated by anesthesia emergently. Wife is en route to hospital

## 2015-02-19 NOTE — Procedures (Signed)
Intubation Procedure Note Andre Tran [id number] October 26, 1960  Procedure: Intubation Indications: Airway protection and maintenance  Procedure Details Consent: Risks of procedure as well as the alternatives and risks of each were explained to the (patient/caregiver).  Consent for procedure obtained. Time Out: Verified patient identification, verified procedure, site/side was marked, verified correct patient position, special equipment/implants available, medications/allergies/relevent history reviewed, required imaging and test results available.  Performed  Maximum sterile technique was used including antiseptics, gloves and hand hygiene.  MAC and 3    Evaluation Hemodynamic Status: BP stable throughout; O2 sats: stable throughout Patient's Current Condition: stable Complications: No apparent complications Patient did tolerate procedure well. Chest X-ray ordered to verify placement.  CXR: pending.   Maud Deed M 02/19/2015

## 2015-02-20 ENCOUNTER — Ambulatory Visit (HOSPITAL_COMMUNITY): Payer: Medicare Other

## 2015-02-20 MED ORDER — SUCCINYLCHOLINE CHLORIDE 20 MG/ML IJ SOLN
INTRAMUSCULAR | Status: AC
  Filled 2015-02-20: qty 1

## 2015-02-20 MED ORDER — ETOMIDATE 2 MG/ML IV SOLN
INTRAVENOUS | Status: AC
  Filled 2015-02-20: qty 20

## 2015-02-20 MED ORDER — ROCURONIUM BROMIDE 50 MG/5ML IV SOLN
INTRAVENOUS | Status: AC
  Filled 2015-02-20: qty 2

## 2015-02-20 MED ORDER — LIDOCAINE HCL (CARDIAC) 20 MG/ML IV SOLN
INTRAVENOUS | Status: AC
  Filled 2015-02-20: qty 5

## 2015-02-23 ENCOUNTER — Ambulatory Visit (HOSPITAL_COMMUNITY): Payer: Medicare Other

## 2015-02-23 ENCOUNTER — Inpatient Hospital Stay (HOSPITAL_COMMUNITY): Admit: 2015-02-23 | Payer: Medicare Other

## 2015-02-23 ENCOUNTER — Telehealth: Payer: Self-pay | Admitting: Licensed Clinical Social Worker

## 2015-02-23 NOTE — Telephone Encounter (Signed)
CSW contacted patient's wife to offer support. Patient's wife stated she and family are doing well under the circumstances and have made arrangements for a [employer]. CSW provided support and will per wife's request pass on the information regarding the memorial service to [place] and HF Team. Raquel Sarna, [place]

## 2015-02-24 ENCOUNTER — Inpatient Hospital Stay (HOSPITAL_COMMUNITY): Payer: Medicare Other

## 2015-02-25 ENCOUNTER — Ambulatory Visit (HOSPITAL_COMMUNITY): Payer: Medicare Other

## 2015-02-27 ENCOUNTER — Ambulatory Visit (HOSPITAL_COMMUNITY): Payer: Medicare Other

## 2015-03-02 ENCOUNTER — Ambulatory Visit (HOSPITAL_COMMUNITY): Payer: Medicare Other

## 2015-03-04 ENCOUNTER — Ambulatory Visit (HOSPITAL_COMMUNITY): Payer: Medicare Other

## 2015-03-06 ENCOUNTER — Ambulatory Visit (HOSPITAL_COMMUNITY): Payer: Medicare Other

## 2015-03-09 ENCOUNTER — Ambulatory Visit (HOSPITAL_COMMUNITY): Payer: Medicare Other

## 2015-03-11 ENCOUNTER — Ambulatory Visit (HOSPITAL_COMMUNITY): Payer: Medicare Other

## 2015-03-12 NOTE — Progress Notes (Signed)
Versed 35 ml and Morphine 40 ml wasted in sink- Melene Plan RN, Arbutus Leas RN

## 2015-03-12 NOTE — Progress Notes (Signed)
Patient noted to be bradycardic on monitor. At patient bedside patient became wide complex bradyPEA then to asystole. At 0541 patient pronounced by 2 RN Melene Plan RN and Blase Mess RN- no heart or lung sounds heard. Wife notified by phone and Md notified. Family not coming back up to hospital.

## 2015-03-12 DEATH — deceased

## 2015-03-13 ENCOUNTER — Ambulatory Visit (HOSPITAL_COMMUNITY): Payer: Medicare Other

## 2015-03-18 ENCOUNTER — Ambulatory Visit (HOSPITAL_COMMUNITY): Payer: Medicare Other

## 2015-03-20 ENCOUNTER — Ambulatory Visit (HOSPITAL_COMMUNITY): Payer: Medicare Other

## 2015-03-23 ENCOUNTER — Ambulatory Visit (HOSPITAL_COMMUNITY): Payer: Medicare Other

## 2015-03-25 ENCOUNTER — Ambulatory Visit (HOSPITAL_COMMUNITY): Payer: Medicare Other

## 2015-03-27 ENCOUNTER — Ambulatory Visit (HOSPITAL_COMMUNITY): Payer: Medicare Other

## 2015-03-30 ENCOUNTER — Ambulatory Visit (HOSPITAL_COMMUNITY): Payer: Medicare Other

## 2015-04-01 ENCOUNTER — Ambulatory Visit (HOSPITAL_COMMUNITY): Payer: Medicare Other

## 2015-04-03 ENCOUNTER — Ambulatory Visit (HOSPITAL_COMMUNITY): Payer: Medicare Other

## 2015-04-06 ENCOUNTER — Ambulatory Visit (HOSPITAL_COMMUNITY): Payer: Medicare Other

## 2015-04-08 ENCOUNTER — Ambulatory Visit (HOSPITAL_COMMUNITY): Payer: Medicare Other

## 2015-04-10 ENCOUNTER — Ambulatory Visit (HOSPITAL_COMMUNITY): Payer: Medicare Other

## 2015-04-13 ENCOUNTER — Ambulatory Visit (HOSPITAL_COMMUNITY): Payer: Medicare Other

## 2015-04-15 ENCOUNTER — Ambulatory Visit (HOSPITAL_COMMUNITY): Payer: Medicare Other

## 2015-04-17 ENCOUNTER — Ambulatory Visit (HOSPITAL_COMMUNITY): Payer: Medicare Other

## 2015-04-20 ENCOUNTER — Ambulatory Visit (HOSPITAL_COMMUNITY): Payer: Medicare Other

## 2015-04-22 ENCOUNTER — Ambulatory Visit (HOSPITAL_COMMUNITY): Payer: Medicare Other

## 2015-04-24 ENCOUNTER — Ambulatory Visit (HOSPITAL_COMMUNITY): Payer: Medicare Other

## 2015-05-12 NOTE — Discharge Summary (Signed)
.   Advanced Heart Failure Team  Discharge Summary   Patient ID: Andre Tran MRN: [id number], DOB/AGE: 1961/03/09 54 y.o. Admit date: 03/02/2015 D/C date:     Mar 21, 2015   Primary Discharge Diagnoses:  1) Intracranial hemorrhage 2) Acute CVA 3) Chronic systolic HF s/p HM II LVAD placement 4) Adult Still's disease 5) H/o spontaneous intracranial hemorrhage in 5/16 6) H/o non hodkins lymphoma 1992, Hodgkins 2007 7) DM2  Hospital Course:   Andre Tran is a 54 y.o. 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 Underwent HMII LVAD placement 10/13/2014   Admitted 5/16 with headache and CT showed spontaneous intracerebral bleed with INR 3.1. Coumadin restarted cautiously.   Had prolonged admission in July and August 2016 with recurrent fevers and 30 pound weight loss. Eventually felt to have adult Still's disease and started on prednisone. While in hospital also found to have renal infarct and small renal mass in left kidney (exophytic but too small to biopsy). Given labile INRs and recent intracranial bleed and then renal infarct on therapeutic dosing of warfarin decision was made to switch to Eliquis.   Presented to ER on 02/12/2015 with transient aphasia and worsening of his right-sided weakness. Head CT without acute findings. He was seen by Neurology. Symptoms improved quickly and concern was for partial seizures.   Unfortunately, in early AM hours 8/11, had sudden mental status change with decerebrate posturing. Repeat CT with large ICH with intraventricular extension. He was transferred to ICU and intubated by anesthesia. Seen emergently by Neurology and Neurosurgery and not felt to be candidate for  evacuation of hematoma due to extremely poor prognosis.   After discussions with his wife and other family members, he was made comfort care and underwent terminal extubation and deactivation of his LVAD. He was placed on morphine and versed drips and he passed several hours later.   Signed, Glori Bickers  04/14/2015, 6:48 PM

## 2015-09-09 IMAGING — DX DG CHEST 2V
2 series · 2 of 2 positions shown · non-contrast
Comparison: 09/08/2014

CLINICAL DATA: Preoperative evaluation prior to left ventricular
assist device placement

EXAM:
CHEST  2 VIEW

[chest pa]
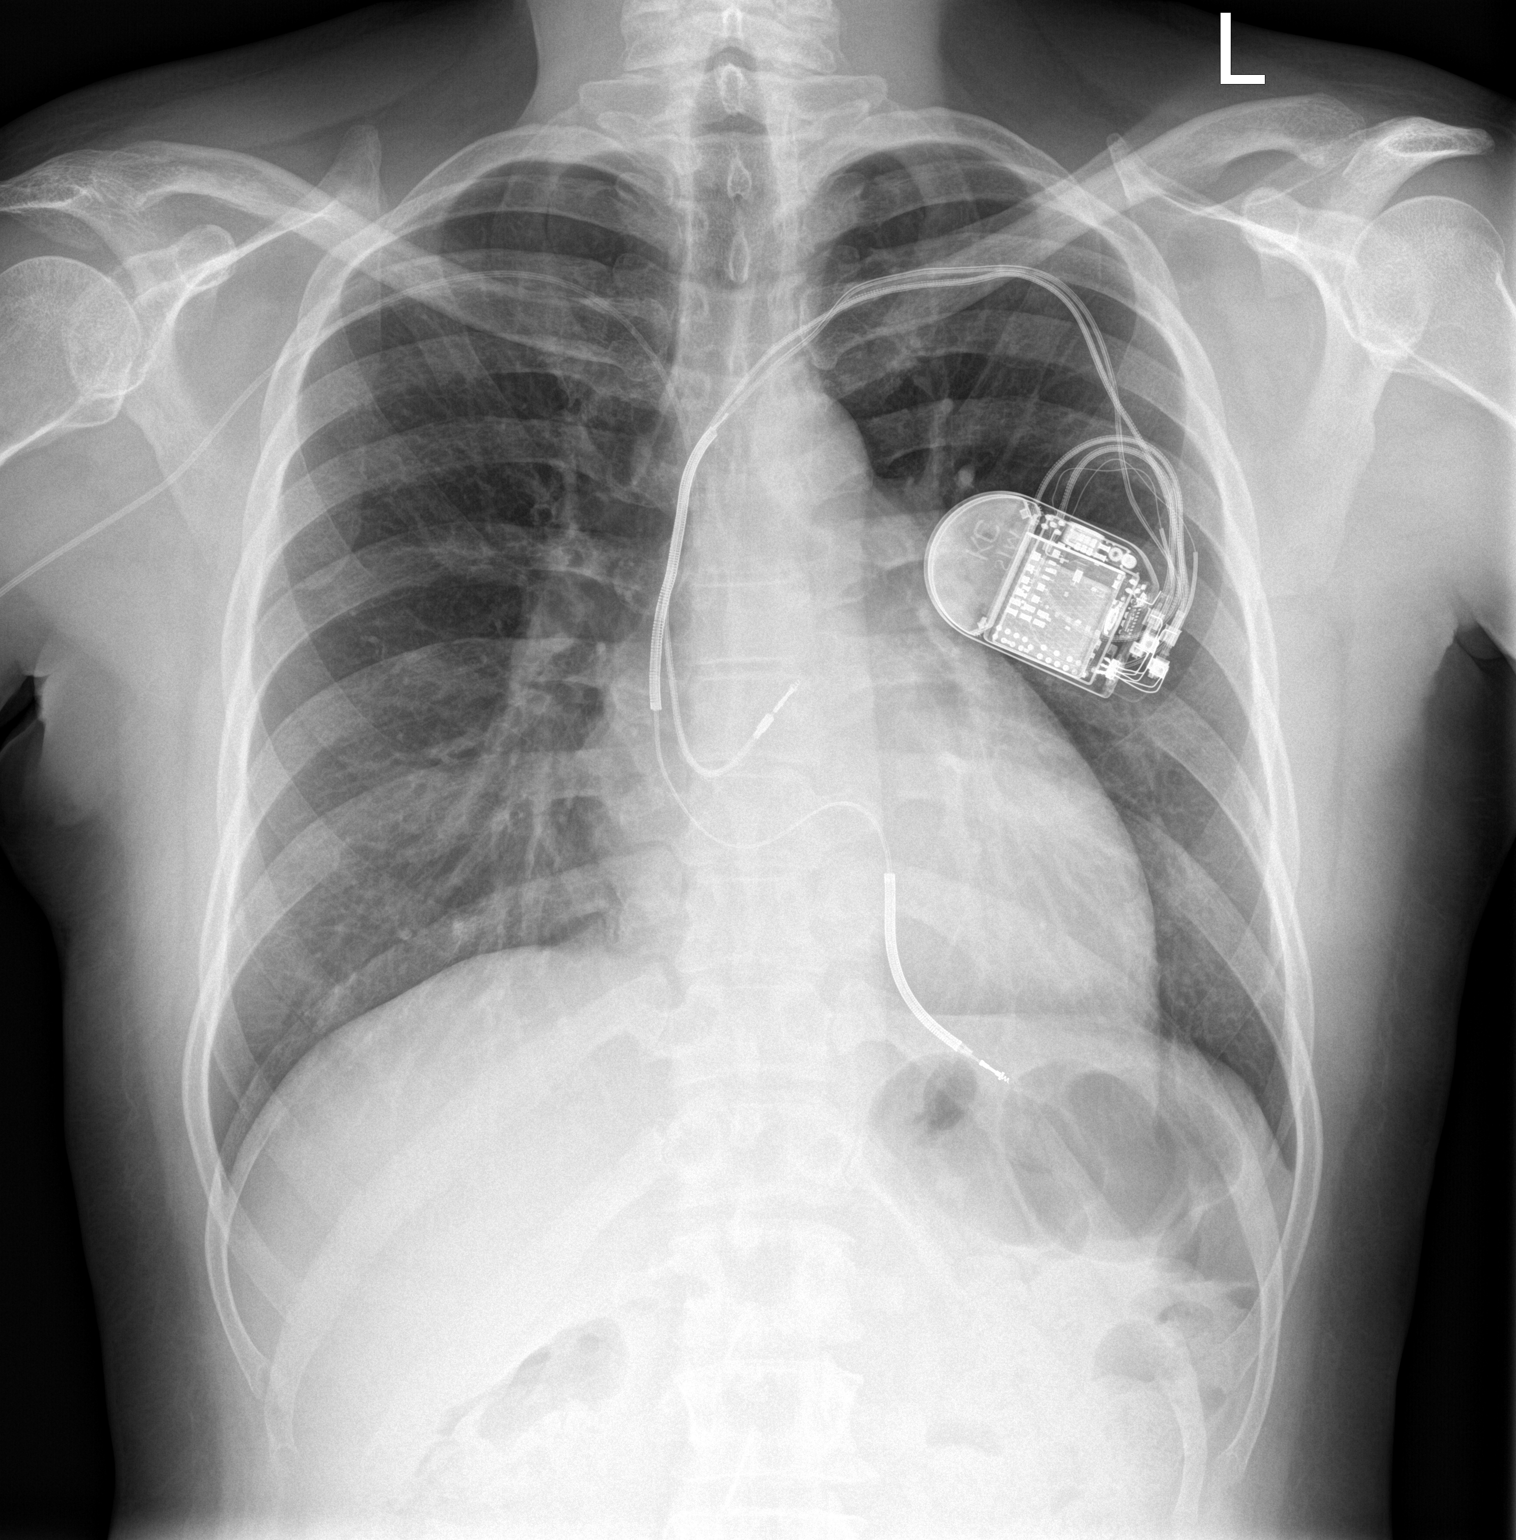

[chest lat]
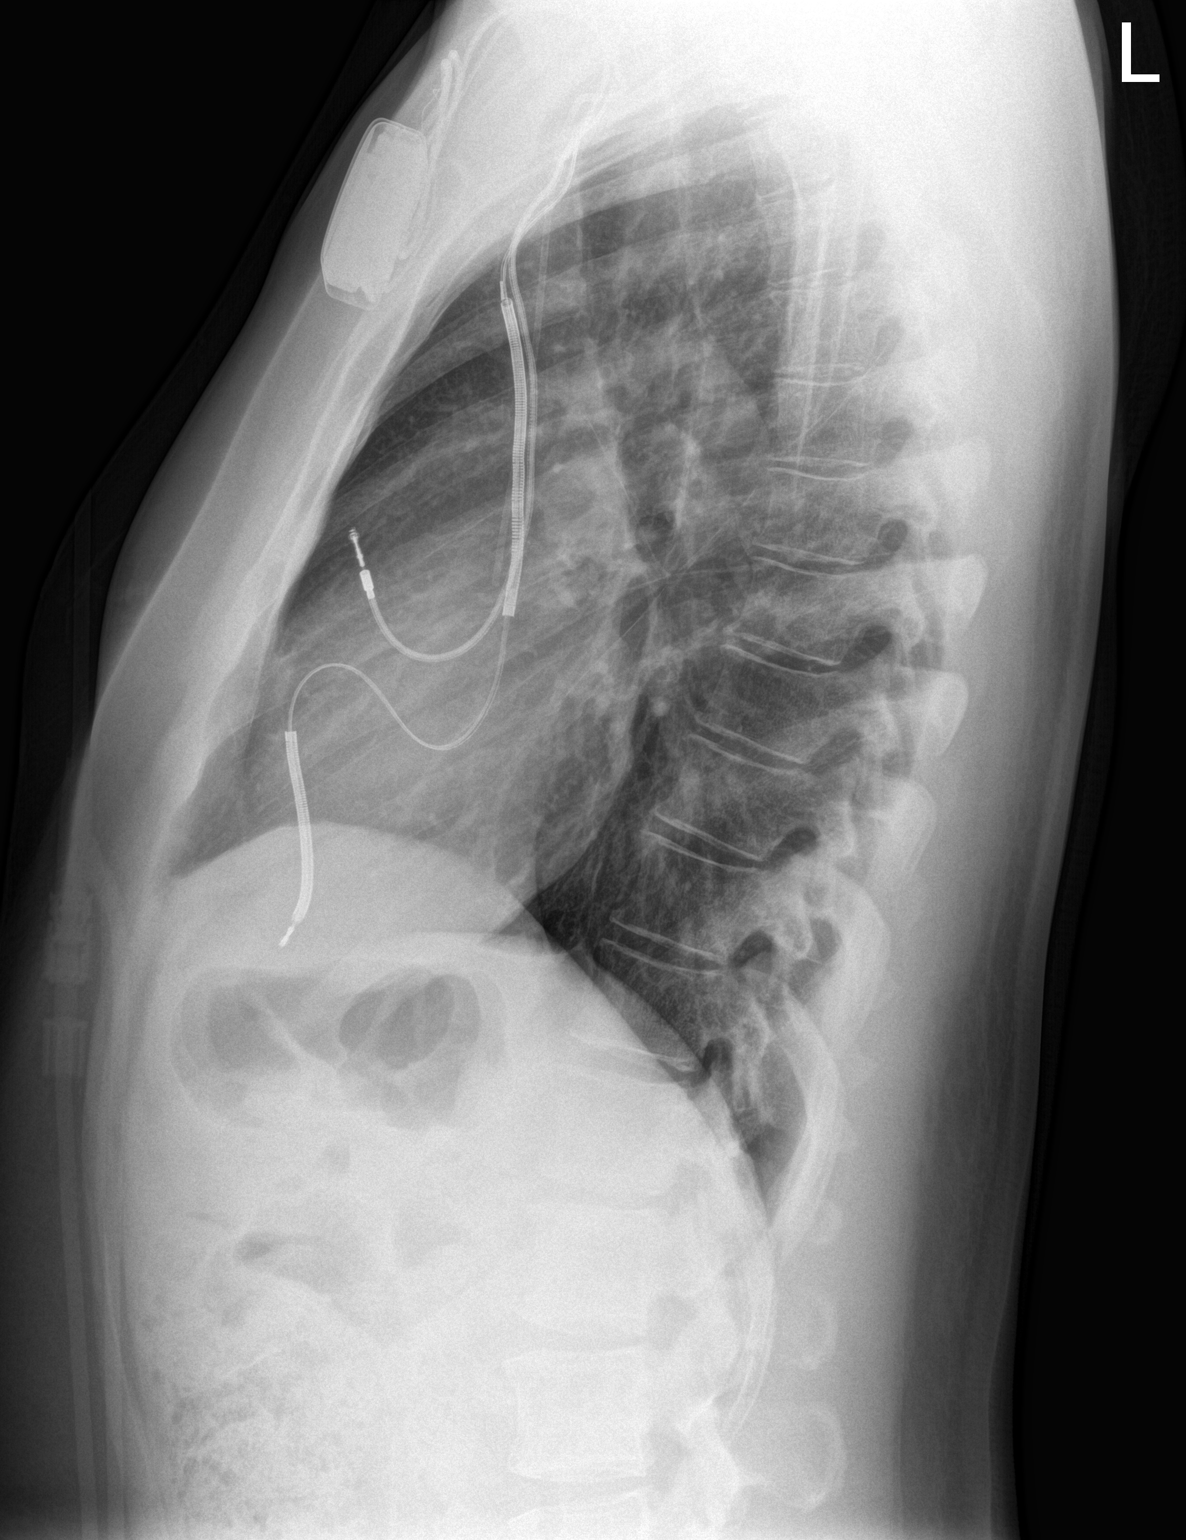

[2 of 2 positions shown; findings below may reference images not displayed]

FINDINGS: Left AICD in place. Mild prominence of the cardiomediastinal
silhouette is reidentified without evidence for overt edema. Lung
fields are grossly clear. Right-sided PICC line terminates over the
mid SVC. No pleural effusion. No acute osseous finding.
IMPRESSION: No acute abnormality.

## 2015-09-11 IMAGING — CR DG CHEST 1V PORT
1 series · 1 of 1 positions shown · non-contrast
Comparison: October 11, 2014

CLINICAL DATA: Left ventricular assist device placement

EXAM:
PORTABLE CHEST - 1 VIEW

[portable]
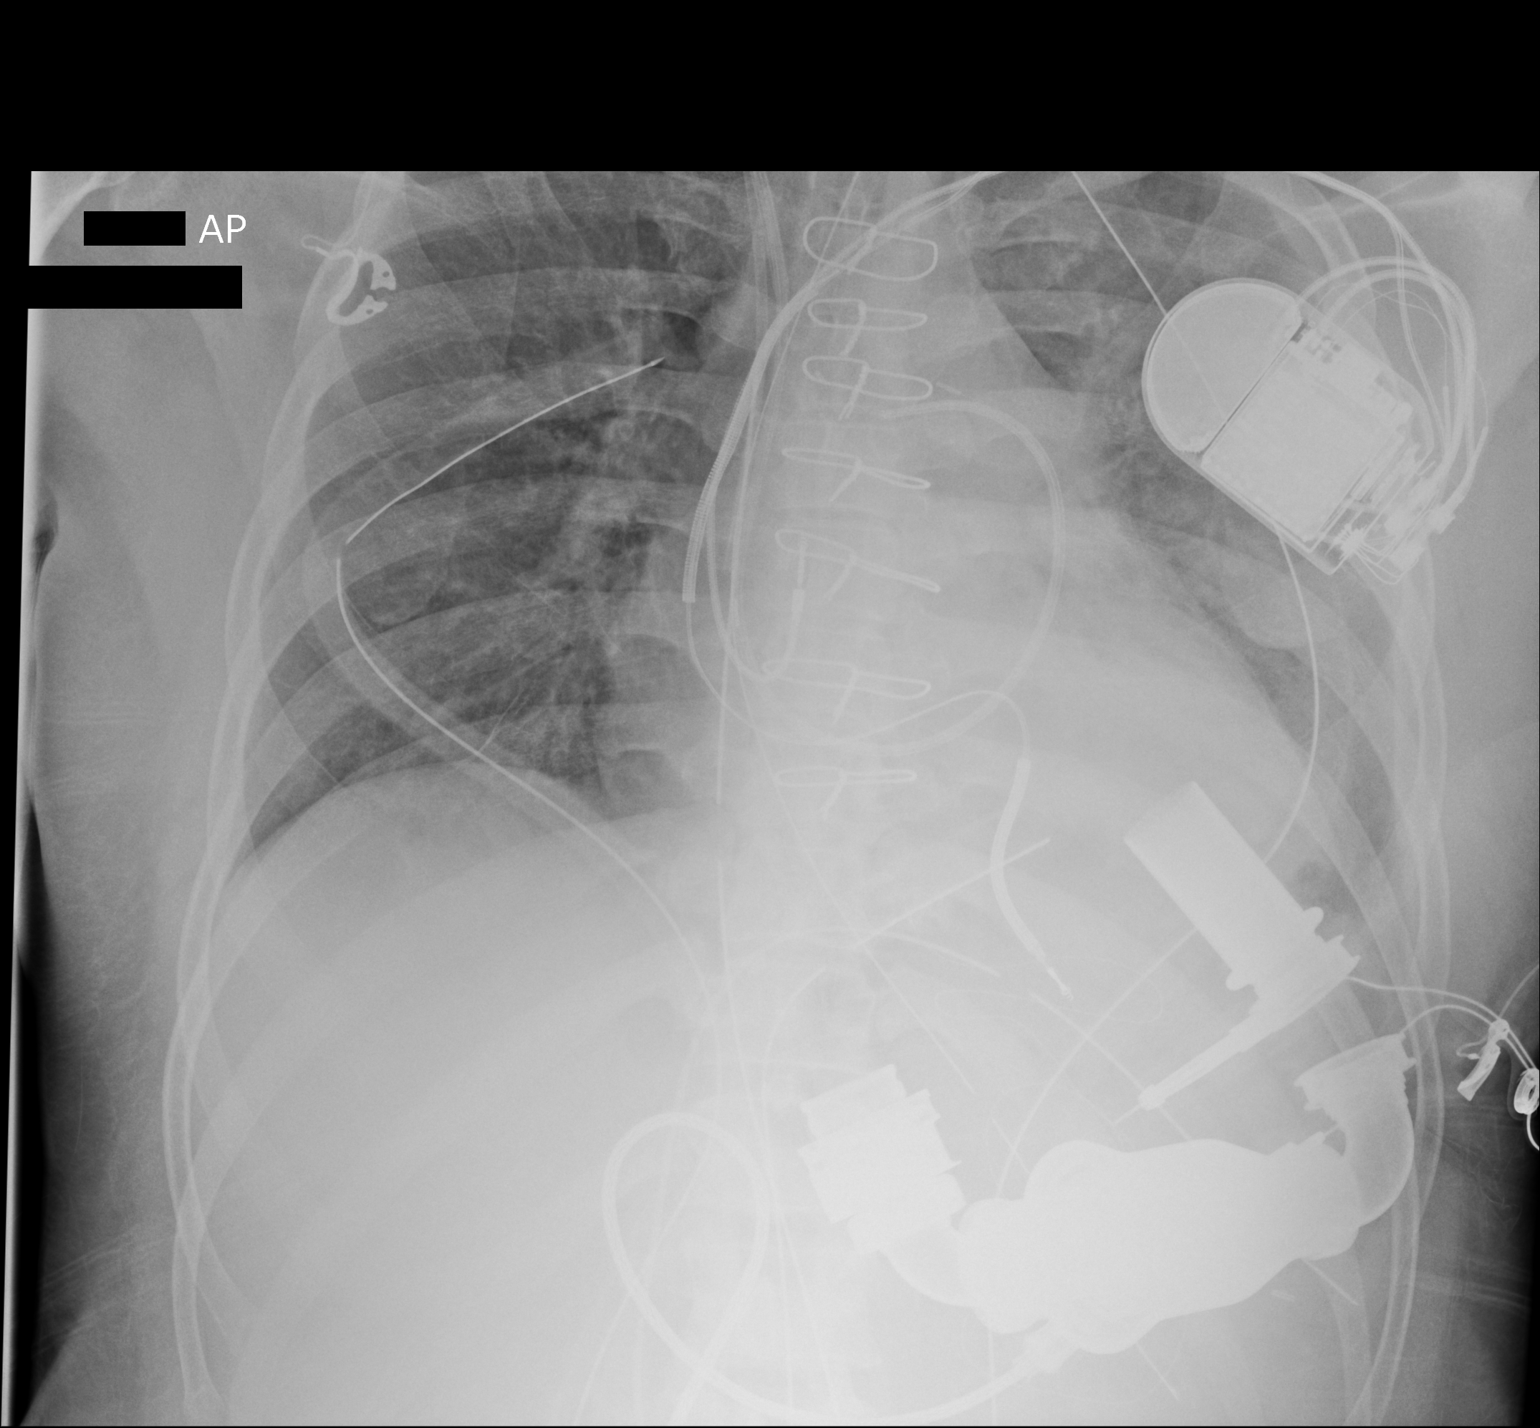

[1 of 1 positions shown; findings below may reference images not displayed]

FINDINGS: There is a left ventricular assist device present. Endotracheal tube
tip is 4.6 cm above the carina. Swan-Ganz catheter tip is in the
right main pulmonary outflow tract. There is a nasogastric tube with
the tip and side-port in the stomach. There are bilateral chest
tubes as well as mediastinal drains. There is a central catheter
with tip in superior vena cava near the cavoatrial junction.
Pacemaker lead tips are attached to the right atrium and right
ventricle. No pneumothorax.

There is left lower lobe volume loss. Lungs elsewhere clear. Heart
is borderline prominent with pulmonary vascularity within normal
limits. No adenopathy. No bone lesions.
IMPRESSION: Tube and catheter positions as described without pneumothorax. Left
lower lobe volume loss, probably atelectatic. Lungs elsewhere clear.
Left ventricular assist device present.

## 2015-09-14 IMAGING — CR DG CHEST 1V PORT
1 series · 1 of 1 positions shown · non-contrast
Comparison: Portable chest x-ray October 15, 2014

CLINICAL DATA: Status post placement of implantable left
ventricular assist device on October 13, 2014, this acute and chronic
CHF

EXAM:
PORTABLE CHEST - 1 VIEW

[AP]
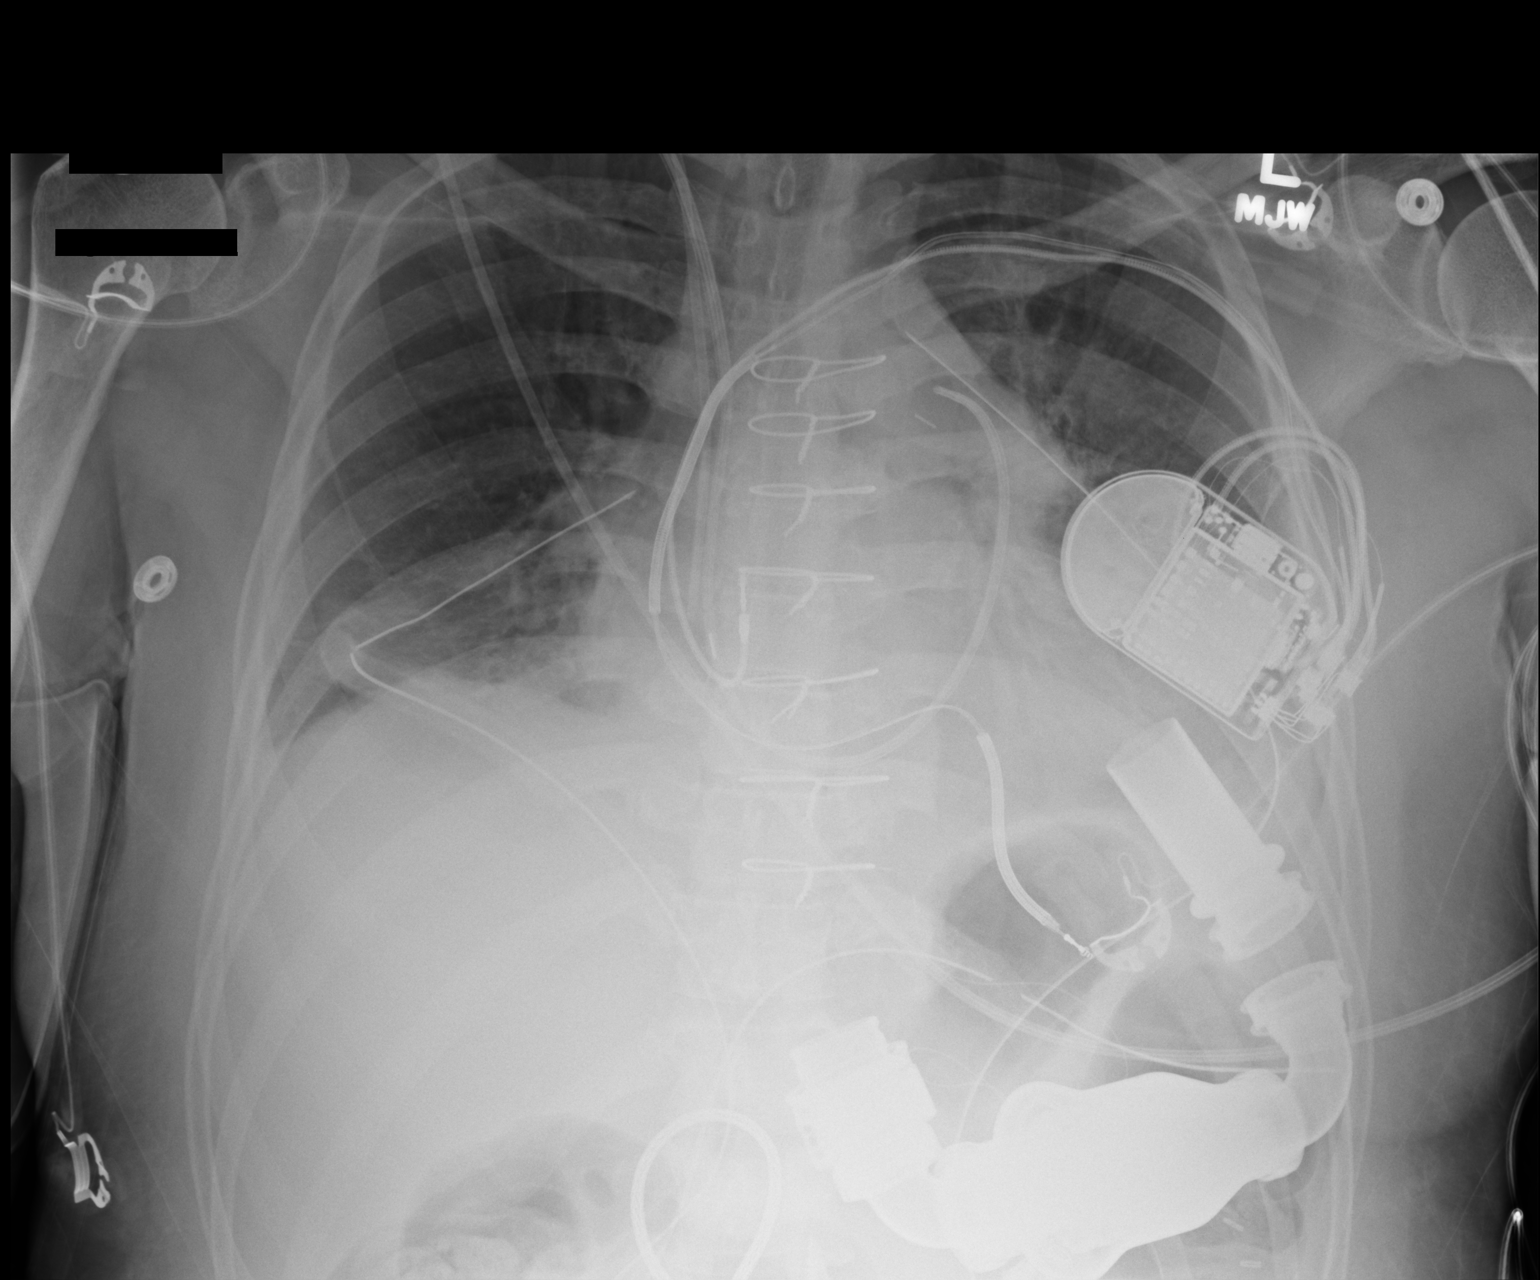

[1 of 1 positions shown; findings below may reference images not displayed]

FINDINGS: There has been interval extubation of the trachea and esophagus. The
lung volumes remain low. The pulmonary interstitial markings have
improved considerably, bilaterally. The retrocardiac region on the
left remains dense. The bilateral chest tubes are unchanged in
position. The cardiac silhouette is enlarged. The left ventricular
assist device is unchanged in position. The permanent pacemaker
defibrillator is in reasonable position radiographically. The 2
right internal jugular venous catheters are unchanged in position.
IMPRESSION: There has been interval improvement in the appearance of the
pulmonary interstitium consistent with resolving interstitial edema.

## 2015-09-15 IMAGING — CR DG CHEST 1V PORT
1 series · 2 of 2 positions shown · non-contrast
Comparison: 10/16/2014.

CLINICAL DATA: Left ventricular assist device.

EXAM:
PORTABLE CHEST - 1 VIEW

[Series 1: portable · 0.17mm/px · 2 of 2 slices shown]
[im 1/2]
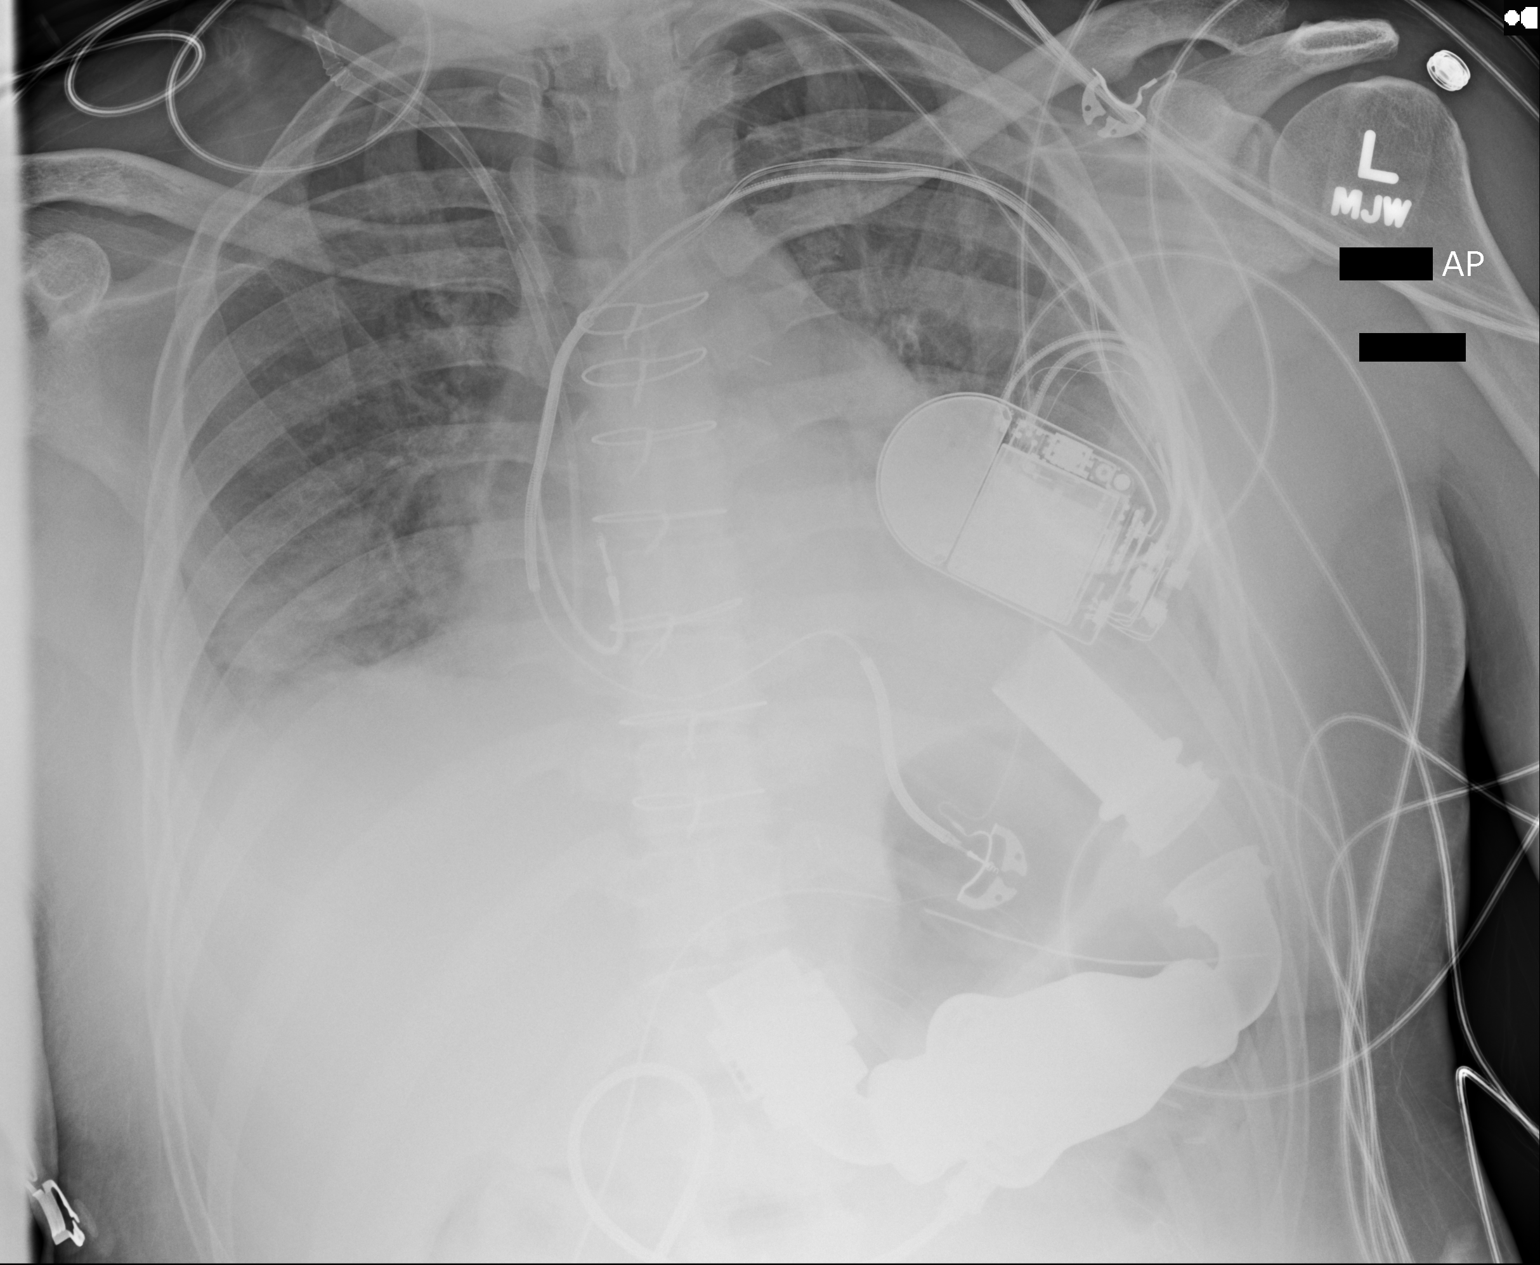
[im 2/2]
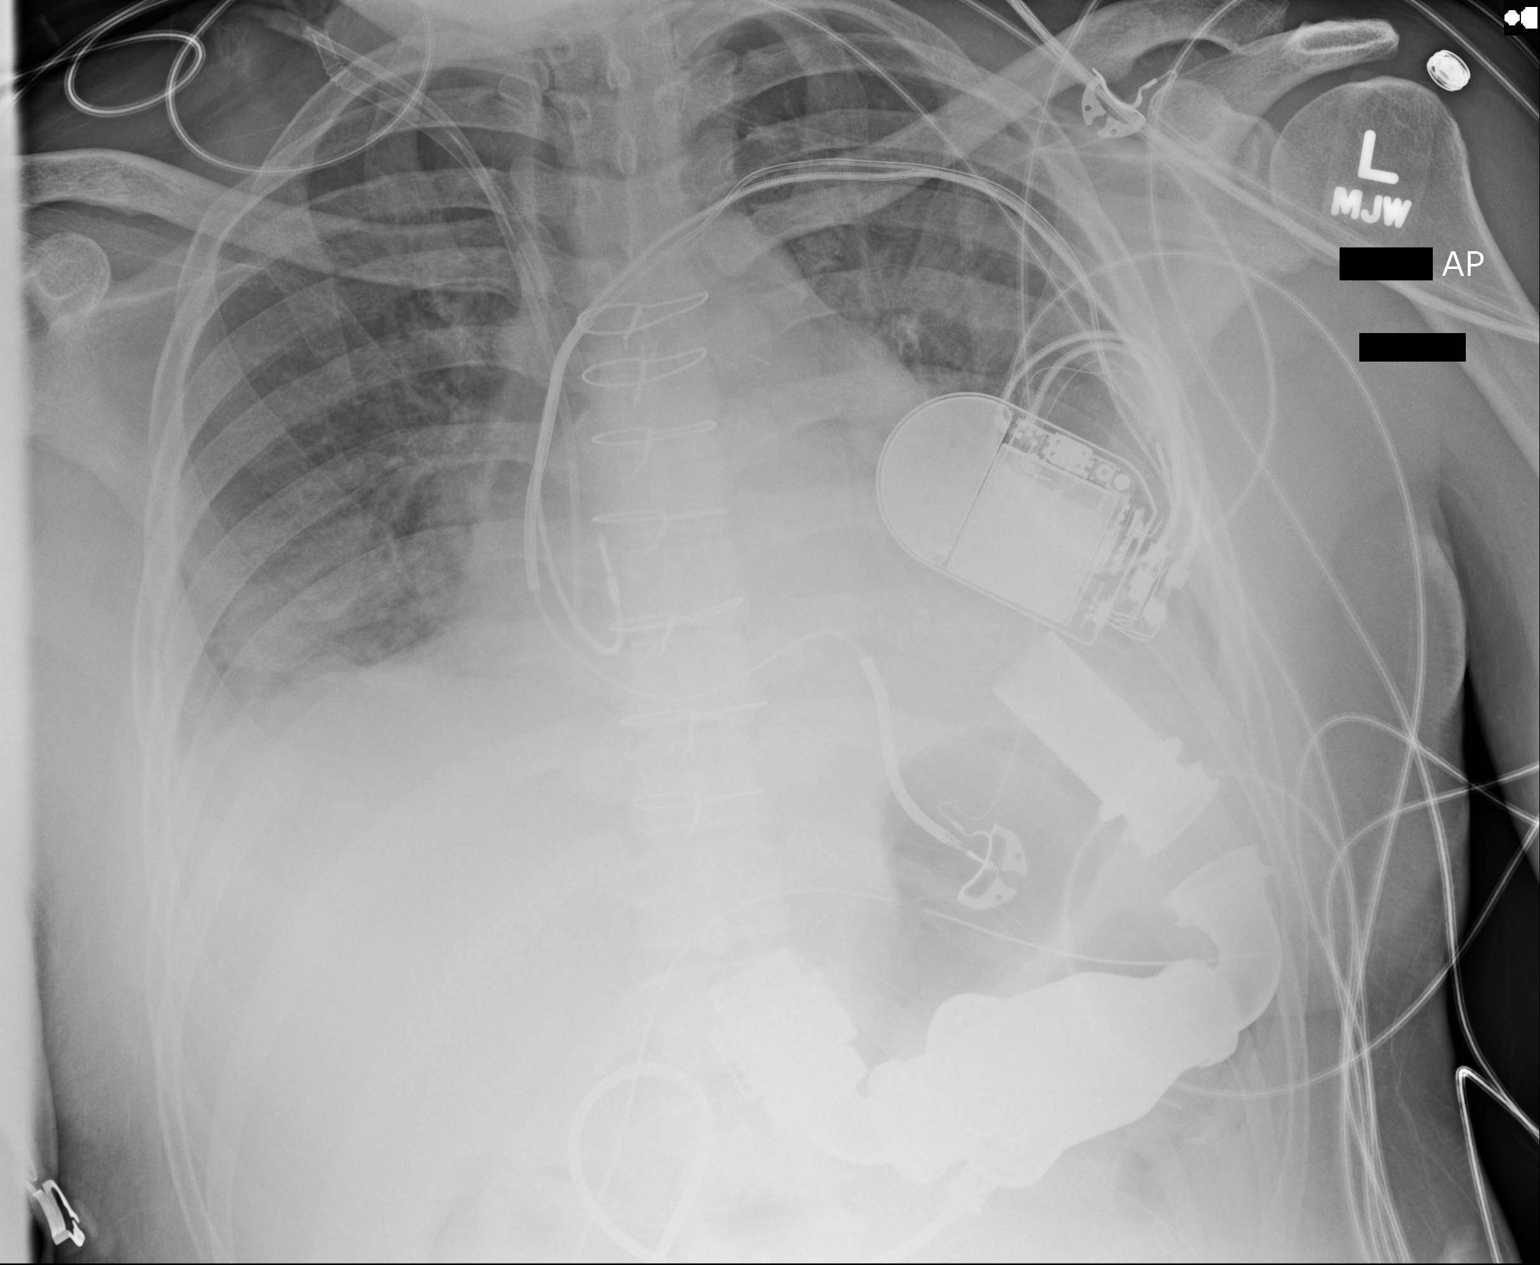

[2 of 2 positions shown; findings below may reference images not displayed]

FINDINGS: Interval removal of bilateral chest tubes. Interval removal of
Swan-Ganz catheter. Two right IJ line stable position. Median
sternotomy. Pacemaker left ventricular assist device in stable
position. Stable cardiomegaly. Low lung volumes with basilar
atelectasis and/or infiltrates/pulmonary edema. Small pleural
effusions cannot be excluded. No pneumothorax.
IMPRESSION: 1. Interim removal bilateral chest tubes. Interval removal Swan-Ganz
catheter. Two right IJ lines in stable position.
2. Prior median sternotomy. Cardiac pacer left ventricular assist
device in stable position.
3. Low lung volumes with bibasilar atelectasis and/or
infiltrates/pulmonary edema. Small pleural effusions cannot be
excluded. No pneumothorax.
